# Patient Record
Sex: Female | Born: 1935 | Race: White | Hispanic: No | Smoking: Never smoker
Health system: Southern US, Community
[De-identification: ages and names within clinical notes are randomized; demographics above are authoritative.]

## PROBLEM LIST (undated history)

## (undated) DIAGNOSIS — J45909 Unspecified asthma, uncomplicated: Secondary | ICD-10-CM

## (undated) DIAGNOSIS — R011 Cardiac murmur, unspecified: Secondary | ICD-10-CM

## (undated) DIAGNOSIS — K469 Unspecified abdominal hernia without obstruction or gangrene: Secondary | ICD-10-CM

## (undated) DIAGNOSIS — I10 Essential (primary) hypertension: Secondary | ICD-10-CM

## (undated) DIAGNOSIS — E785 Hyperlipidemia, unspecified: Secondary | ICD-10-CM

## (undated) DIAGNOSIS — C439 Malignant melanoma of skin, unspecified: Secondary | ICD-10-CM

## (undated) DIAGNOSIS — R32 Unspecified urinary incontinence: Secondary | ICD-10-CM

## (undated) DIAGNOSIS — F419 Anxiety disorder, unspecified: Secondary | ICD-10-CM

## (undated) DIAGNOSIS — M1990 Unspecified osteoarthritis, unspecified site: Secondary | ICD-10-CM

## (undated) DIAGNOSIS — K219 Gastro-esophageal reflux disease without esophagitis: Secondary | ICD-10-CM

## (undated) DIAGNOSIS — R55 Syncope and collapse: Secondary | ICD-10-CM

## (undated) DIAGNOSIS — J449 Chronic obstructive pulmonary disease, unspecified: Secondary | ICD-10-CM

## (undated) HISTORY — PX: CHOLECYSTECTOMY: SHX55

## (undated) HISTORY — DX: Cardiac murmur, unspecified: R01.1

## (undated) HISTORY — DX: Chronic obstructive pulmonary disease, unspecified: J44.9

## (undated) HISTORY — DX: Unspecified osteoarthritis, unspecified site: M19.90

## (undated) HISTORY — PX: ABDOMINAL HYSTERECTOMY: SHX81

## (undated) HISTORY — PX: HERNIA REPAIR: SHX51

## (undated) HISTORY — DX: Malignant melanoma of skin, unspecified: C43.9

## (undated) HISTORY — DX: Syncope and collapse: R55

## (undated) HISTORY — DX: Essential (primary) hypertension: I10

## (undated) HISTORY — DX: Hyperlipidemia, unspecified: E78.5

## (undated) HISTORY — DX: Unspecified abdominal hernia without obstruction or gangrene: K46.9

## (undated) HISTORY — DX: Gastro-esophageal reflux disease without esophagitis: K21.9

## (undated) HISTORY — DX: Unspecified urinary incontinence: R32

---

## 2005-12-01 ENCOUNTER — Ambulatory Visit: Payer: Self-pay | Admitting: Ophthalmology

## 2005-12-05 ENCOUNTER — Ambulatory Visit: Payer: Self-pay | Admitting: Ophthalmology

## 2006-03-05 LAB — HM COLONOSCOPY

## 2007-11-14 ENCOUNTER — Ambulatory Visit: Payer: Self-pay | Admitting: Internal Medicine

## 2007-12-10 ENCOUNTER — Ambulatory Visit: Payer: Self-pay | Admitting: Internal Medicine

## 2009-12-11 ENCOUNTER — Ambulatory Visit: Payer: Self-pay | Admitting: Internal Medicine

## 2010-11-07 ENCOUNTER — Ambulatory Visit: Payer: Self-pay | Admitting: Family Medicine

## 2010-11-23 ENCOUNTER — Ambulatory Visit: Payer: Self-pay | Admitting: Pain Medicine

## 2010-12-09 ENCOUNTER — Ambulatory Visit: Payer: Self-pay | Admitting: Pain Medicine

## 2010-12-14 ENCOUNTER — Ambulatory Visit: Payer: Self-pay | Admitting: Internal Medicine

## 2010-12-22 ENCOUNTER — Ambulatory Visit: Payer: Self-pay | Admitting: Pain Medicine

## 2010-12-28 ENCOUNTER — Ambulatory Visit: Payer: Self-pay | Admitting: Pain Medicine

## 2010-12-30 ENCOUNTER — Encounter: Payer: Self-pay | Admitting: Internal Medicine

## 2011-01-11 ENCOUNTER — Ambulatory Visit: Payer: Self-pay | Admitting: Pain Medicine

## 2011-01-18 ENCOUNTER — Other Ambulatory Visit: Payer: Self-pay | Admitting: *Deleted

## 2011-01-18 MED ORDER — ESTROGENS CONJUGATED 0.3 MG PO TABS: 0.3000 mg | ORAL_TABLET | Freq: Every day | ORAL | Status: DC

## 2011-01-19 ENCOUNTER — Other Ambulatory Visit: Payer: Self-pay | Admitting: *Deleted

## 2011-01-19 MED ORDER — HYDROCODONE-ACETAMINOPHEN 5-325 MG PO TABS: 2.0000 | ORAL_TABLET | Freq: Four times a day (QID) | ORAL | Status: DC | PRN

## 2011-01-19 NOTE — Telephone Encounter (Signed)
Rx phoned to pharmacy.  

## 2011-01-25 ENCOUNTER — Ambulatory Visit: Payer: Self-pay | Admitting: Pain Medicine

## 2011-02-21 ENCOUNTER — Telehealth: Payer: Self-pay | Admitting: *Deleted

## 2011-02-21 ENCOUNTER — Encounter: Payer: Self-pay | Admitting: Internal Medicine

## 2011-02-21 ENCOUNTER — Ambulatory Visit (INDEPENDENT_AMBULATORY_CARE_PROVIDER_SITE_OTHER): Payer: Medicare Other | Admitting: Internal Medicine

## 2011-02-21 VITALS — BP 165/75 | HR 64 | Temp 98.0°F | Resp 16 | Ht 64.0 in | Wt 214.0 lb

## 2011-02-21 DIAGNOSIS — F329 Major depressive disorder, single episode, unspecified: Secondary | ICD-10-CM | POA: Insufficient documentation

## 2011-02-21 DIAGNOSIS — I10 Essential (primary) hypertension: Secondary | ICD-10-CM | POA: Insufficient documentation

## 2011-02-21 DIAGNOSIS — M5430 Sciatica, unspecified side: Secondary | ICD-10-CM

## 2011-02-21 DIAGNOSIS — F32A Depression, unspecified: Secondary | ICD-10-CM | POA: Insufficient documentation

## 2011-02-21 DIAGNOSIS — R300 Dysuria: Secondary | ICD-10-CM

## 2011-02-21 DIAGNOSIS — M5432 Sciatica, left side: Secondary | ICD-10-CM | POA: Insufficient documentation

## 2011-02-21 LAB — POCT URINALYSIS DIPSTICK
Bilirubin, UA: NEGATIVE
Blood, UA: NEGATIVE
Ketones, UA: NEGATIVE
Protein, UA: NEGATIVE
Spec Grav, UA: 1.015
pH, UA: 7

## 2011-02-21 MED ORDER — SERTRALINE HCL 50 MG PO TABS: 100.0000 mg | ORAL_TABLET | Freq: Every day | ORAL | Status: DC

## 2011-02-21 MED ORDER — LIDOCAINE 5 % EX PTCH: 1.0000 | MEDICATED_PATCH | Freq: Two times a day (BID) | CUTANEOUS | Status: DC

## 2011-02-21 MED ORDER — CLARITHROMYCIN 500 MG PO TABS: 500.0000 mg | ORAL_TABLET | Freq: Two times a day (BID) | ORAL | Status: AC

## 2011-02-21 NOTE — Telephone Encounter (Signed)
Patient takes 2 zoloft daily, but only 30 was called in. Is it okay to change quantity to 60. Please advise.

## 2011-02-21 NOTE — Progress Notes (Signed)
Subjective:    Patient ID: Cindy Graham, female    DOB: Oct 04, 1921, 77 y.o.   MRN: [id number]  HPI Cindy Graham is an 77 year old female with a history of sciatic pain and hypertension who presents for followup. Her sciatic pain has been recently severe. It extends from her left lower back into her left hip. It no longer radiates down her left leg. This pain has been keeping her from sleeping. She has been evaluated at the pain clinic and has had epidural injections without improvement. She has stopped taking Vicodin for pain because she did not like the side effects of this medication. She is having some difficulty walking and performing her activities of daily living because of severe left sciatic pain. She was recently told that she should be evaluated at a tertiary care center for an experimental procedure.  Cindy Graham also has a history of hypertension. She did not bring a record of her blood pressures today. She reports increased anxiety in the setting of chronic sciatic pain and inability to perform her activities of daily living, which she suspects raises her blood pressure. Her son notes that she is often diaphoretic after taking a shower or performing other activities of daily living. She denies any chest pain, palpitations, shortness of breath.  Cindy Graham also reports a several day history of increased urinary frequency and dysuria. She denies any flank pain or fever. She denies any blood in her urine. She denies any chills.  Outpatient Encounter Prescriptions as of 02/21/2011  Medication Sig Dispense Refill  . aspirin 81 MG tablet Take 81 mg by mouth daily.        . Biotin 1000 MCG tablet Take 1,000 mcg by mouth daily.        . cholecalciferol (VITAMIN D) 400 UNITS TABS Take 400 Units by mouth 2 (two) times daily.        . Flaxseed, Linseed, (FLAX SEED OIL) 1000 MG CAPS Take 1 capsule by mouth daily.        . Multiple Vitamin (MULTIVITAMIN) capsule Take 1 capsule by mouth daily.          . nitroGLYCERIN (NITROSTAT) 0.3 MG SL tablet Place 0.3 mg under the tongue every 5 (five) minutes as needed.        Marland Kitchen atenolol (TENORMIN) 25 MG tablet Take 1 tablet by mouth Daily.      . clarithromycin (BIAXIN) 500 MG tablet Take 1 tablet (500 mg total) by mouth 2 (two) times daily.  28 tablet  0  . enalapril (VASOTEC) 20 MG tablet Take 1 tablet by mouth Twice daily.      Marland Kitchen estrogens, conjugated, (PREMARIN) 0.3 MG tablet Take 1 tablet (0.3 mg total) by mouth daily. Take daily for 21 days then do not take for 7 days.  30 tablet  11  . FLOVENT HFA 110 MCG/ACT inhaler Inhale 2 puffs into the lungs daily.       Marland Kitchen gabapentin (NEURONTIN) 300 MG capsule Take 1 tablet by mouth daily.      Marland Kitchen lidocaine (LIDODERM) 5 % Place 1 patch onto the skin every 12 (twelve) hours. Remove & Discard patch within 12 hours or as directed by MD  10 patch  0  . nystatin (NYSTOP) 100000 UNIT/GM POWD Apply 1 application topically BID times 48H.      Marland Kitchen omeprazole (PRILOSEC) 20 MG capsule Take 1 tablet by mouth Daily.      Marland Kitchen oxybutynin (DITROPAN) 5 MG tablet Take 1 tablet by  mouth Daily.      . sertraline (ZOLOFT) 50 MG tablet Take 2 tablets (100 mg total) by mouth daily.  30 tablet  11  . triamterene-hydrochlorothiazide (MAXZIDE) 75-50 MG per tablet Take 1 tablet by mouth Daily.      . verapamil (CALAN-SR) 180 MG CR tablet Take 1 tablet by mouth Daily.        Review of Systems  Constitutional: Negative for fever, chills, appetite change, fatigue and unexpected weight change.  HENT: Negative for ear pain, congestion, sore throat, trouble swallowing, neck pain, voice change and sinus pressure.   Eyes: Negative for visual disturbance.  Respiratory: Negative for cough, shortness of breath, wheezing and stridor.   Cardiovascular: Negative for chest pain, palpitations and leg swelling.  Gastrointestinal: Negative for nausea, vomiting, abdominal pain, diarrhea, constipation, blood in stool, abdominal distention and anal  bleeding.  Genitourinary: Positive for dysuria and frequency. Negative for flank pain.  Musculoskeletal: Positive for myalgias, back pain and arthralgias. Negative for gait problem.  Skin: Negative for color change and rash.  Neurological: Negative for dizziness and headaches.  Hematological: Negative for adenopathy. Does not bruise/bleed easily.  Psychiatric/Behavioral: Negative for suicidal ideas, sleep disturbance and dysphoric mood. The patient is nervous/anxious.    BP 165/75  Pulse 64  Temp(Src) 98 F (36.7 C) (Oral)  Resp 16  Ht 5\' 4"  (1.626 m)  Wt 214 lb (97.07 kg)  BMI 36.73 kg/m2  SpO2 94%     Objective:   Physical Exam  Constitutional: She is oriented to person, place, and time. She appears well-developed and well-nourished. No distress.  HENT:  Head: Normocephalic and atraumatic.  Right Ear: External ear normal.  Left Ear: External ear normal.  Nose: Nose normal.  Mouth/Throat: Oropharynx is clear and moist. No oropharyngeal exudate.  Eyes: Conjunctivae are normal. Pupils are equal, round, and reactive to light. Right eye exhibits no discharge. Left eye exhibits no discharge. No scleral icterus.  Neck: Normal range of motion. Neck supple. No tracheal deviation present. No thyromegaly present.  Cardiovascular: Normal rate, regular rhythm, normal heart sounds and intact distal pulses.  Exam reveals no gallop and no friction rub.   No murmur heard. Pulmonary/Chest: Effort normal and breath sounds normal. No respiratory distress. She has no wheezes. She has no rales. She exhibits no tenderness.  Abdominal: There is no CVA tenderness.  Musculoskeletal: Normal range of motion. She exhibits tenderness (lower back). She exhibits no edema.  Lymphadenopathy:    She has no cervical adenopathy.  Neurological: She is alert and oriented to person, place, and time. No cranial nerve deficit. She exhibits normal muscle tone. Gait (limited by pain) abnormal. Coordination normal.   Skin: Skin is warm and dry. No rash noted. She is not diaphoretic. No erythema. No pallor.  Psychiatric: She has a normal mood and affect. Her behavior is normal. Judgment and thought content normal.          Assessment & Plan:  1. Sciatica - patient with severe left sciatic pain which has not responded to steroid injection. Will set up an evaluation with Dr. Yves Dill at [facility]. Patient would prefer not to use her contacts for severe pain because of sedation. We will try and anal Lidoderm patch to see if there is any improvement with this. She will followup in one month.  2. Dysuria - patient with dysuria. Urine is remarkable for leukocytes. Will treat empirically with clarithromycin. Note, we are treating with clarithromycin because of several antibiotic allergies. She is  also uncertain about a Cipro allergy. We will send urine for culture. She will followup in one month.  3. Hypertension - patient with hypertension. Blood pressure is elevated today. Likely secondary to increased pain and anxiety. We'll continue to monitor her and have her return to clinic in one month.   4. Anxiety - patient with significant anxiety related to recent pain and difficulty doing ADLs. Will increase sertraline dose to 100 mg daily. She will follow up in one month.

## 2011-02-21 NOTE — Telephone Encounter (Signed)
Done

## 2011-02-21 NOTE — Telephone Encounter (Signed)
Fine to call in quantity 60.

## 2011-02-21 NOTE — Patient Instructions (Signed)
Start antibiotic today. We will set up appointment with Dr. Yves Dill. Follow up 1 month.

## 2011-02-23 LAB — URINE CULTURE

## 2011-02-24 ENCOUNTER — Other Ambulatory Visit: Payer: Self-pay | Admitting: *Deleted

## 2011-02-24 MED ORDER — CIPROFLOXACIN HCL 250 MG PO TABS: 250.0000 mg | ORAL_TABLET | Freq: Two times a day (BID) | ORAL | Status: AC

## 2011-03-02 ENCOUNTER — Other Ambulatory Visit: Payer: Self-pay | Admitting: Internal Medicine

## 2011-03-02 DIAGNOSIS — M5432 Sciatica, left side: Secondary | ICD-10-CM

## 2011-03-02 MED ORDER — LIDOCAINE 5 % EX PTCH: 1.0000 | MEDICATED_PATCH | Freq: Two times a day (BID) | CUTANEOUS | Status: DC

## 2011-03-21 ENCOUNTER — Other Ambulatory Visit: Payer: Self-pay | Admitting: Internal Medicine

## 2011-03-21 MED ORDER — ATENOLOL 25 MG PO TABS: 25.0000 mg | ORAL_TABLET | Freq: Every day | ORAL | Status: DC

## 2011-03-28 ENCOUNTER — Ambulatory Visit (INDEPENDENT_AMBULATORY_CARE_PROVIDER_SITE_OTHER): Payer: Medicare Other | Admitting: Internal Medicine

## 2011-03-28 ENCOUNTER — Encounter: Payer: Self-pay | Admitting: Internal Medicine

## 2011-03-28 VITALS — BP 124/60 | HR 73 | Temp 98.3°F | Wt 211.0 lb

## 2011-03-28 DIAGNOSIS — R5383 Other fatigue: Secondary | ICD-10-CM

## 2011-03-28 DIAGNOSIS — E039 Hypothyroidism, unspecified: Secondary | ICD-10-CM

## 2011-03-28 DIAGNOSIS — E531 Pyridoxine deficiency: Secondary | ICD-10-CM

## 2011-03-28 DIAGNOSIS — R5381 Other malaise: Secondary | ICD-10-CM

## 2011-03-28 DIAGNOSIS — D510 Vitamin B12 deficiency anemia due to intrinsic factor deficiency: Secondary | ICD-10-CM

## 2011-03-28 LAB — TSH: TSH: 1.3 u[IU]/mL (ref 0.35–5.50)

## 2011-03-28 LAB — CBC WITH DIFFERENTIAL/PLATELET
Basophils Relative: 0.3 % (ref 0.0–3.0)
Eosinophils Relative: 3.7 % (ref 0.0–5.0)
HCT: 40.2 % (ref 36.0–46.0)
Lymphs Abs: 2.5 10*3/uL (ref 0.7–4.0)
Monocytes Relative: 7.7 % (ref 3.0–12.0)
Platelets: 275 10*3/uL (ref 150.0–400.0)
RBC: 3.96 Mil/uL (ref 3.87–5.11)
WBC: 8.1 10*3/uL (ref 4.5–10.5)

## 2011-03-28 LAB — COMPREHENSIVE METABOLIC PANEL
ALT: 18 U/L (ref 0–35)
CO2: 25 mEq/L (ref 19–32)
Calcium: 9 mg/dL (ref 8.4–10.5)
Chloride: 106 mEq/L (ref 96–112)
Creatinine, Ser: 1.2 mg/dL (ref 0.4–1.2)
GFR: 43.43 mL/min — ABNORMAL LOW (ref >60.00)
Glucose, Bld: 109 mg/dL — ABNORMAL HIGH (ref 70–99)
Sodium: 140 mEq/L (ref 135–145)
Total Protein: 6.9 g/dL (ref 6.0–8.3)

## 2011-03-28 MED ORDER — CYANOCOBALAMIN 1000 MCG/ML IJ SOLN
1000.0000 ug | Freq: Once | INTRAMUSCULAR | Status: AC
  Administered 2011-03-28: 1000 ug via INTRAMUSCULAR

## 2011-03-28 NOTE — Progress Notes (Signed)
Subjective:    Patient ID: Cindy Graham, female    DOB: [birth year], 77 y.o.   MRN: [id number]  HPI 77 YO female presents to follow up sciatic pain. Reports significant improvement with use of Lidoderm patch. Continues to have some arthritic pain in knees bilaterally.   Primary concern today is fatigue. Reports generalized weakness and difficulty completing ADLs. Denies chest pain, dyspnea.  Notes weakness in BLE. No recent falls. Notably, has missed last 3 B12 injections.  Outpatient Encounter Prescriptions as of 03/28/2011  Medication Sig Dispense Refill  . aspirin 81 MG tablet Take 81 mg by mouth daily.        Marland Kitchen atenolol (TENORMIN) 25 MG tablet Take 1 tablet (25 mg total) by mouth daily.  30 tablet  6  . Biotin 1000 MCG tablet Take 1,000 mcg by mouth daily.        . cholecalciferol (VITAMIN D) 400 UNITS TABS Take 400 Units by mouth 2 (two) times daily.        . enalapril (VASOTEC) 20 MG tablet Take 1 tablet by mouth Twice daily.      Marland Kitchen estrogens, conjugated, (PREMARIN) 0.3 MG tablet Take 0.3 mg by mouth daily.        . Flaxseed, Linseed, (FLAX SEED OIL) 1000 MG CAPS Take 1 capsule by mouth daily.        Marland Kitchen FLOVENT HFA 110 MCG/ACT inhaler Inhale 2 puffs into the lungs daily.       Marland Kitchen gabapentin (NEURONTIN) 300 MG capsule Take 1 tablet by mouth daily.      Marland Kitchen lidocaine (LIDODERM) 5 % Place 1 patch onto the skin every 12 (twelve) hours. Remove & Discard patch within 12 hours or as directed by MD  30 patch  3  . Multiple Vitamin (MULTIVITAMIN) capsule Take 1 capsule by mouth daily.        . nitroGLYCERIN (NITROSTAT) 0.3 MG SL tablet Place 0.3 mg under the tongue every 5 (five) minutes as needed.        . nystatin (NYSTOP) 100000 UNIT/GM POWD Apply 1 application topically BID times 48H.      Marland Kitchen omeprazole (PRILOSEC) 20 MG capsule Take 1 tablet by mouth Daily.      Marland Kitchen oxybutynin (DITROPAN) 5 MG tablet Take 1 tablet by mouth Daily.         Review of Systems  Constitutional: Positive for fatigue.  Negative for fever, chills, appetite change and unexpected weight change.  HENT: Negative for ear pain, congestion, sore throat, trouble swallowing, neck pain, voice change and sinus pressure.   Eyes: Negative for visual disturbance.  Respiratory: Negative for cough, shortness of breath, wheezing and stridor.   Cardiovascular: Negative for chest pain, palpitations and leg swelling.  Gastrointestinal: Negative for nausea, vomiting, abdominal pain, diarrhea, constipation, blood in stool, abdominal distention and anal bleeding.  Genitourinary: Negative for dysuria and flank pain.  Musculoskeletal: Positive for myalgias, back pain and arthralgias. Negative for gait problem.  Skin: Negative for color change and rash.  Neurological: Positive for weakness. Negative for dizziness and headaches.  Hematological: Negative for adenopathy. Does not bruise/bleed easily.  Psychiatric/Behavioral: Negative for suicidal ideas, sleep disturbance and dysphoric mood. The patient is not nervous/anxious.    BP 124/60  Pulse 73  Temp(Src) 98.3 F (36.8 C) (Oral)  Wt 211 lb (95.709 kg)  SpO2 89%     Objective:   Physical Exam  Constitutional: Cindy Graham is oriented to person, place, and time. Cindy Graham appears well-developed and well-nourished. No  distress.  HENT:  Head: Normocephalic and atraumatic.  Right Ear: External ear normal.  Left Ear: External ear normal.  Nose: Nose normal.  Mouth/Throat: Oropharynx is clear and moist. No oropharyngeal exudate.  Eyes: Conjunctivae are normal. Pupils are equal, round, and reactive to light. Right eye exhibits no discharge. Left eye exhibits no discharge. No scleral icterus.  Neck: Normal range of motion. Neck supple. No tracheal deviation present. No thyromegaly present.  Cardiovascular: Normal rate, regular rhythm, normal heart sounds and intact distal pulses.  Exam reveals no gallop and no friction rub.   No murmur heard. Pulmonary/Chest: Effort normal and breath sounds  normal. No respiratory distress. Cindy Graham has no wheezes. Cindy Graham has no rales. Cindy Graham exhibits no tenderness.  Musculoskeletal: Normal range of motion. Cindy Graham exhibits no edema and no tenderness.  Lymphadenopathy:    Cindy Graham has no cervical adenopathy.  Neurological: Cindy Graham is alert and oriented to person, place, and time. No cranial nerve deficit. Cindy Graham exhibits normal muscle tone. Coordination normal.  Skin: Skin is warm and dry. No rash noted. Cindy Graham is not diaphoretic. No erythema. No pallor.  Psychiatric: Cindy Graham has a normal mood and affect. Her behavior is normal. Judgment and thought content normal.          Assessment & Plan:  1. Fatigue - Likely secondary to both deconditioning and Vit B12 deficiency. Encouraged increased physical activity. Discussed adding PT for strengthening if no improvement next few weeks. Will restart B12 today. Will check B12 and TSH with labs. Follow up 1 month.  2. Sciatic pain - Improved with lidoderm patch. Will continue to monitor.  3. Pernicious anemia - Has missed several B12 shots. Will check B12 level and CBC today. Resume B12 supplementation.

## 2011-04-04 ENCOUNTER — Other Ambulatory Visit (INDEPENDENT_AMBULATORY_CARE_PROVIDER_SITE_OTHER): Payer: Medicare Other | Admitting: *Deleted

## 2011-04-04 DIAGNOSIS — E538 Deficiency of other specified B group vitamins: Secondary | ICD-10-CM

## 2011-04-04 MED ORDER — CYANOCOBALAMIN 1000 MCG/ML IJ SOLN
1000.0000 ug | Freq: Once | INTRAMUSCULAR | Status: AC
  Administered 2011-04-04: 1000 ug via INTRAMUSCULAR

## 2011-04-11 ENCOUNTER — Other Ambulatory Visit: Payer: Medicare Other | Admitting: *Deleted

## 2011-04-11 DIAGNOSIS — E538 Deficiency of other specified B group vitamins: Secondary | ICD-10-CM

## 2011-04-11 MED ORDER — CYANOCOBALAMIN 1000 MCG/ML IJ SOLN
1000.0000 ug | Freq: Once | INTRAMUSCULAR | Status: AC
  Administered 2011-04-11: 1000 ug via INTRAMUSCULAR

## 2011-04-15 ENCOUNTER — Other Ambulatory Visit (INDEPENDENT_AMBULATORY_CARE_PROVIDER_SITE_OTHER): Payer: Medicare Other | Admitting: *Deleted

## 2011-04-15 ENCOUNTER — Telehealth: Payer: Self-pay | Admitting: *Deleted

## 2011-04-15 DIAGNOSIS — N390 Urinary tract infection, site not specified: Secondary | ICD-10-CM

## 2011-04-15 LAB — POCT URINALYSIS DIPSTICK
Bilirubin, UA: NEGATIVE
Glucose, UA: NEGATIVE
Ketones, UA: NEGATIVE
Nitrite, UA: POSITIVE

## 2011-04-15 MED ORDER — CIPROFLOXACIN HCL 250 MG PO TABS: 250.0000 mg | ORAL_TABLET | Freq: Two times a day (BID) | ORAL | Status: AC

## 2011-04-15 NOTE — Telephone Encounter (Signed)
Pt c/o possible UTI, urinary frequency & pain. She is coming in today for u/a & culture if positive. Dr Darrick Huntsman, we will send result to you for review when ready in Dr Tilman Neat absence, [facility] YOU!

## 2011-04-15 NOTE — Telephone Encounter (Signed)
Message copied by Vernie Murders on Fri Apr 15, 2011  4:44 PM ------      Message from: Duncan Dull      Created: Fri Apr 15, 2011  4:05 PM       UTI likely.  Rx  Ciprofloxacin 250 mg twice daily for 5 days  #10 no refills.  Send urine for culture.

## 2011-04-15 NOTE — Telephone Encounter (Signed)
Done,  Patient informed

## 2011-04-18 ENCOUNTER — Ambulatory Visit (INDEPENDENT_AMBULATORY_CARE_PROVIDER_SITE_OTHER): Payer: Medicare Other | Admitting: *Deleted

## 2011-04-18 DIAGNOSIS — E538 Deficiency of other specified B group vitamins: Secondary | ICD-10-CM

## 2011-04-18 LAB — URINE CULTURE: Colony Count: >=100000

## 2011-04-18 MED ORDER — CYANOCOBALAMIN 1000 MCG/ML IJ SOLN
1000.0000 ug | Freq: Once | INTRAMUSCULAR | Status: AC
  Administered 2011-04-18: 1000 ug via INTRAMUSCULAR

## 2011-04-19 ENCOUNTER — Other Ambulatory Visit: Payer: Self-pay | Admitting: *Deleted

## 2011-04-19 MED ORDER — TRIAMTERENE-HCTZ 75-50 MG PO TABS: 1.0000 | ORAL_TABLET | Freq: Every day | ORAL | Status: DC

## 2011-04-27 ENCOUNTER — Ambulatory Visit (INDEPENDENT_AMBULATORY_CARE_PROVIDER_SITE_OTHER): Payer: Medicare Other | Admitting: Internal Medicine

## 2011-04-27 ENCOUNTER — Other Ambulatory Visit: Payer: Self-pay | Admitting: Internal Medicine

## 2011-04-27 ENCOUNTER — Encounter: Payer: Self-pay | Admitting: Internal Medicine

## 2011-04-27 VITALS — BP 120/72 | HR 68 | Temp 98.3°F | Wt 211.0 lb

## 2011-04-27 DIAGNOSIS — M25569 Pain in unspecified knee: Secondary | ICD-10-CM

## 2011-04-27 DIAGNOSIS — N390 Urinary tract infection, site not specified: Secondary | ICD-10-CM

## 2011-04-27 DIAGNOSIS — E538 Deficiency of other specified B group vitamins: Secondary | ICD-10-CM

## 2011-04-27 MED ORDER — CYANOCOBALAMIN 1000 MCG/ML IJ SOLN
1000.0000 ug | Freq: Once | INTRAMUSCULAR | Status: AC
  Administered 2011-04-27: 1000 ug via INTRAMUSCULAR

## 2011-04-27 MED ORDER — CIPROFLOXACIN HCL 250 MG PO TABS: 250.0000 mg | ORAL_TABLET | Freq: Two times a day (BID) | ORAL | Status: AC

## 2011-04-27 MED ORDER — HYDROCODONE-ACETAMINOPHEN 5-325 MG PO TABS: 2.0000 | ORAL_TABLET | Freq: Three times a day (TID) | ORAL | Status: AC | PRN

## 2011-04-27 NOTE — Progress Notes (Signed)
Subjective:    Patient ID: Cindy Graham, female    DOB: 01-04-1922, 77 y.o.   MRN: [id number]  HPI 77 year old female with a history of hypertension, severe sciatic pain, and osteoarthritis presents for followup. She reports that her sciatic pain is slightly improved. She reports that her bilateral knee pain is much worse. She was seen by orthopedics and they recommended Synvisc injection. She is unable to afford this so has been using hydrocodone as needed for severe pain. Her severe osteoarthritis in her knees limits her ability to walk. She is currently using a cane.  She notes some burning when she urinates. This has been present for several weeks. She was recently treated with a five-day course of Cipro for urinary tract infection. She reports her symptoms improved but did not completely resolve. She denies any fever or chills. She denies any flank pain.  Outpatient Encounter Prescriptions as of 04/27/2011  Medication Sig Dispense Refill  . aspirin 81 MG tablet Take 81 mg by mouth daily.        Marland Kitchen atenolol (TENORMIN) 25 MG tablet Take 1 tablet (25 mg total) by mouth daily.  30 tablet  6  . Biotin 1000 MCG tablet Take 1,000 mcg by mouth daily.        . cholecalciferol (VITAMIN D) 400 UNITS TABS Take 400 Units by mouth 2 (two) times daily.        . enalapril (VASOTEC) 20 MG tablet Take 1 tablet by mouth Twice daily.      Marland Kitchen estrogens, conjugated, (PREMARIN) 0.3 MG tablet Take 0.3 mg by mouth daily.        . Flaxseed, Linseed, (FLAX SEED OIL) 1000 MG CAPS Take 1 capsule by mouth daily.        Marland Kitchen FLOVENT HFA 110 MCG/ACT inhaler Inhale 2 puffs into the lungs daily.       Marland Kitchen gabapentin (NEURONTIN) 300 MG capsule Take 1 tablet by mouth daily.      Marland Kitchen lidocaine (LIDODERM) 5 % Place 1 patch onto the skin every 12 (twelve) hours. Remove & Discard patch within 12 hours or as directed by MD  30 patch  3  . Multiple Vitamin (MULTIVITAMIN) capsule Take 1 capsule by mouth daily.        . nitroGLYCERIN  (NITROSTAT) 0.3 MG SL tablet Place 0.3 mg under the tongue every 5 (five) minutes as needed.        . nystatin (NYSTOP) 100000 UNIT/GM POWD Apply 1 application topically BID times 48H.      Marland Kitchen omeprazole (PRILOSEC) 20 MG capsule Take 1 tablet by mouth Daily.      Marland Kitchen oxybutynin (DITROPAN) 5 MG tablet Take 1 tablet by mouth Daily.      . sertraline (ZOLOFT) 50 MG tablet Take 2 tablets (100 mg total) by mouth daily.  30 tablet  11  . triamterene-hydrochlorothiazide (MAXZIDE) 75-50 MG per tablet Take 1 tablet by mouth daily.  90 tablet  1  . verapamil (CALAN-SR) 180 MG CR tablet Take 1 tablet by mouth Daily.      . ciprofloxacin (CIPRO) 250 MG tablet Take 1 tablet (250 mg total) by mouth 2 (two) times daily.  28 tablet  0  . HYDROcodone-acetaminophen (NORCO) 5-325 MG per tablet Take 2 tablets by mouth every 8 (eight) hours as needed for pain.  90 tablet  0     Review of Systems  Constitutional: Negative for fever, chills, appetite change, fatigue and unexpected weight change.  HENT: Negative for  ear pain, congestion, sore throat, trouble swallowing, neck pain, voice change and sinus pressure.   Eyes: Negative for visual disturbance.  Respiratory: Negative for cough, shortness of breath, wheezing and stridor.   Cardiovascular: Negative for chest pain, palpitations and leg swelling.  Gastrointestinal: Negative for nausea, vomiting, abdominal pain, diarrhea, constipation, blood in stool, abdominal distention and anal bleeding.  Genitourinary: Negative for dysuria and flank pain.  Musculoskeletal: Positive for myalgias, back pain, joint swelling, arthralgias and gait problem.  Skin: Negative for color change and rash.  Neurological: Negative for dizziness and headaches.  Hematological: Negative for adenopathy. Does not bruise/bleed easily.  Psychiatric/Behavioral: Negative for suicidal ideas, sleep disturbance and dysphoric mood. The patient is not nervous/anxious.    BP 120/72  Pulse 68  Temp(Src)  98.3 F (36.8 C) (Oral)  Wt 211 lb (95.709 kg)  SpO2 96%     Objective:   Physical Exam  Constitutional: She is oriented to person, place, and time. She appears well-developed and well-nourished. No distress.  HENT:  Head: Normocephalic and atraumatic.  Right Ear: External ear normal.  Left Ear: External ear normal.  Nose: Nose normal.  Mouth/Throat: Oropharynx is clear and moist. No oropharyngeal exudate.  Eyes: Conjunctivae are normal. Pupils are equal, round, and reactive to light. Right eye exhibits no discharge. Left eye exhibits no discharge. No scleral icterus.  Neck: Normal range of motion. Neck supple. No tracheal deviation present. No thyromegaly present.  Cardiovascular: Normal rate, regular rhythm, normal heart sounds and intact distal pulses.  Exam reveals no gallop and no friction rub.   No murmur heard. Pulmonary/Chest: Effort normal and breath sounds normal. No respiratory distress. She has no wheezes. She has no rales. She exhibits no tenderness.  Musculoskeletal: She exhibits no edema and no tenderness.       Right knee: She exhibits decreased range of motion and swelling.       Left knee: She exhibits decreased range of motion and swelling.       Lumbar back: She exhibits decreased range of motion and pain.  Lymphadenopathy:    She has no cervical adenopathy.  Neurological: She is alert and oriented to person, place, and time. No cranial nerve deficit. She exhibits normal muscle tone. Coordination normal.  Skin: Skin is warm and dry. No rash noted. She is not diaphoretic. No erythema. No pallor.  Psychiatric: She has a normal mood and affect. Her behavior is normal. Judgment and thought content normal.          Assessment & Plan:  1. Hypertension - BP well controlled on current medications. Will not make any changes today. Follow up in 3 months.  2. Back pain - Secondary to DJD. Will continue hydrocodone prn.  3. Knee pain - Secondary to osteoarthritis. Pt  is followed by orthopedics. Recommended Synvisc injection, however pt unable to afford. Will continue hydrocodone for severe pain. Follow up 3 months.  4. Dysuria - Will send urine for culture and UA today. Will treat with cipro 250mg  po bid x 2 weeks.

## 2011-04-29 LAB — URINE CULTURE: Organism ID, Bacteria: NO GROWTH

## 2011-05-02 ENCOUNTER — Telehealth: Payer: Self-pay | Admitting: *Deleted

## 2011-05-02 NOTE — Telephone Encounter (Signed)
Spoke w/pt - she wanted results of urine culture - informed patient, no growth

## 2011-05-02 NOTE — Telephone Encounter (Signed)
Pt left VM requesting a call back.  

## 2011-05-10 ENCOUNTER — Telehealth: Payer: Self-pay | Admitting: Internal Medicine

## 2011-05-10 ENCOUNTER — Ambulatory Visit (INDEPENDENT_AMBULATORY_CARE_PROVIDER_SITE_OTHER): Payer: Medicare Other | Admitting: *Deleted

## 2011-05-10 DIAGNOSIS — R6889 Other general symptoms and signs: Secondary | ICD-10-CM

## 2011-05-10 LAB — POCT INFLUENZA A/B: Influenza B, POC: NEGATIVE

## 2011-05-10 NOTE — Telephone Encounter (Signed)
Spoke w/pt - she woke up today with fever, chills, nonproductive cough, h/a and weakness. No sinus congestion, ST, or ear complaints. She has taken her regular prescription meds. MD informed, ok to take tylenol and pt is coming in this afternoon for nurse visit for rapid flu swab.   If flu is positive, tamiflu bid x 5 days?

## 2011-05-10 NOTE — Telephone Encounter (Signed)
Rapid flu was negative. Advised patient to continue tylenol, rest and call office w/any change in symptoms.

## 2011-05-10 NOTE — Telephone Encounter (Signed)
Yes, agree

## 2011-05-12 ENCOUNTER — Telehealth: Payer: Self-pay | Admitting: *Deleted

## 2011-05-12 NOTE — Telephone Encounter (Signed)
We already are overbooked at 4:30.  Can we add her tomorrow morning? Is she holding down liquids?

## 2011-05-12 NOTE — Telephone Encounter (Signed)
No nausea or vomiting, she will call office w/any change in symptoms. Scheduled for OV tomorrow am.

## 2011-05-12 NOTE — Telephone Encounter (Signed)
Spoke w/pt - Rapid flu was negative and she was advised to call back if no improvement. She continues to c/o chills, fever, sweats, weakness, non-productive cough, and clear sinus drainage. Also had some loose stools that have resolved from using imodium. Would you like to see patient for OV? OK for 4:30 or should she continue to use tylenol, rest and f/u tomorrow if not better?

## 2011-05-13 ENCOUNTER — Telehealth: Payer: Self-pay | Admitting: *Deleted

## 2011-05-13 ENCOUNTER — Ambulatory Visit (INDEPENDENT_AMBULATORY_CARE_PROVIDER_SITE_OTHER): Payer: Medicare Other | Admitting: Internal Medicine

## 2011-05-13 VITALS — BP 168/60 | HR 73 | Temp 98.2°F | Wt 205.0 lb

## 2011-05-13 DIAGNOSIS — R197 Diarrhea, unspecified: Secondary | ICD-10-CM

## 2011-05-13 NOTE — Telephone Encounter (Signed)
Pt left VM - FYI she says diarrhea is better and will be throwing out the test kit she was given.

## 2011-05-14 ENCOUNTER — Encounter: Payer: Self-pay | Admitting: Internal Medicine

## 2011-05-14 NOTE — Progress Notes (Signed)
Subjective:    Patient ID: Cindy Graham, female    DOB: 05-Jul-1921, 77 y.o.   MRN: [id number]  HPI 77YO female with 1 week h/o diarrhea, fever, chills, nasal congestion, cough.  Denies abdominal pain or blood in stool.  Reports diarrhea is 2-3 episodes per day and watery. Has taken immodium with improvement in diarrhea.  Nasal congestion is clear and cough is non-productive.  Taking tylenol prn for fever.  Notes she has been taking an antibiotic (cannot recall name) prescribed by her dermatologist for "skin infection" over last 2 weeks.  Outpatient Encounter Prescriptions as of 05/13/2011  Medication Sig Dispense Refill  . aspirin 81 MG tablet Take 81 mg by mouth daily.        Marland Kitchen atenolol (TENORMIN) 25 MG tablet Take 1 tablet (25 mg total) by mouth daily.  30 tablet  6  . Biotin 1000 MCG tablet Take 1,000 mcg by mouth daily.        . cholecalciferol (VITAMIN D) 400 UNITS TABS Take 400 Units by mouth 2 (two) times daily.        . enalapril (VASOTEC) 20 MG tablet Take 1 tablet by mouth Twice daily.      Marland Kitchen estrogens, conjugated, (PREMARIN) 0.3 MG tablet Take 0.3 mg by mouth daily.        . Flaxseed, Linseed, (FLAX SEED OIL) 1000 MG CAPS Take 1 capsule by mouth daily.        Marland Kitchen FLOVENT HFA 110 MCG/ACT inhaler Inhale 2 puffs into the lungs daily.       Marland Kitchen gabapentin (NEURONTIN) 300 MG capsule Take 1 tablet by mouth daily.      Marland Kitchen lidocaine (LIDODERM) 5 % Place 1 patch onto the skin every 12 (twelve) hours. Remove & Discard patch within 12 hours or as directed by MD  30 patch  3  . Multiple Vitamin (MULTIVITAMIN) capsule Take 1 capsule by mouth daily.        . nitroGLYCERIN (NITROSTAT) 0.3 MG SL tablet Place 0.3 mg under the tongue every 5 (five) minutes as needed.        . nystatin (NYSTOP) 100000 UNIT/GM POWD Apply 1 application topically BID times 48H.      Marland Kitchen omeprazole (PRILOSEC) 20 MG capsule Take 1 tablet by mouth Daily.      Marland Kitchen oxybutynin (DITROPAN) 5 MG tablet Take 1 tablet by mouth Daily.        . sertraline (ZOLOFT) 50 MG tablet Take 2 tablets (100 mg total) by mouth daily.  30 tablet  11  . triamterene-hydrochlorothiazide (MAXZIDE) 75-50 MG per tablet Take 1 tablet by mouth daily.  90 tablet  1  . verapamil (CALAN-SR) 180 MG CR tablet Take 1 tablet by mouth Daily.        Review of Systems  Constitutional: Positive for fever, chills and fatigue.  HENT: Positive for congestion, rhinorrhea and postnasal drip. Negative for ear pain, sore throat and sinus pressure.   Respiratory: Positive for cough. Negative for shortness of breath.   Cardiovascular: Negative for chest pain, palpitations and leg swelling.  Gastrointestinal: Positive for diarrhea. Negative for abdominal pain, constipation, blood in stool and abdominal distention.  Musculoskeletal: Negative for myalgias and arthralgias.   BP 168/60  Pulse 73  Temp(Src) 98.2 F (36.8 C) (Oral)  Wt 205 lb (92.987 kg)  SpO2 92%     Objective:   Physical Exam  Constitutional: She is oriented to person, place, and time. She appears well-developed and well-nourished. No distress.  HENT:  Head: Normocephalic and atraumatic.  Right Ear: External ear normal.  Left Ear: External ear normal.  Nose: Nose normal.  Mouth/Throat: Oropharynx is clear and moist. No oropharyngeal exudate.  Eyes: Conjunctivae are normal. Pupils are equal, round, and reactive to light. Right eye exhibits no discharge. Left eye exhibits no discharge. No scleral icterus.  Neck: Normal range of motion. Neck supple. No tracheal deviation present. No thyromegaly present.  Cardiovascular: Normal rate, regular rhythm, normal heart sounds and intact distal pulses.  Exam reveals no gallop and no friction rub.   No murmur heard. Pulmonary/Chest: Effort normal and breath sounds normal. No respiratory distress. She has no wheezes. She has no rales. She exhibits no tenderness.  Abdominal: Soft. Bowel sounds are normal. She exhibits no distension and no mass. There is no  tenderness. There is no rebound and no guarding.  Musculoskeletal: Normal range of motion. She exhibits no edema and no tenderness.  Lymphadenopathy:    She has no cervical adenopathy.  Neurological: She is alert and oriented to person, place, and time. No cranial nerve deficit. She exhibits normal muscle tone. Coordination normal.  Skin: Skin is warm and dry. No rash noted. She is not diaphoretic. No erythema. No pallor.  Psychiatric: She has a normal mood and affect. Her behavior is normal. Judgment and thought content normal.          Assessment & Plan:  1. Diarrhea - Likely viral given symptoms of fever, chills, nasal congestion.  Rapid flu was negative. Pt has been taking po antibiotics (she is unsure of name) from dermatologist, so C.DIff would be another consideration, however less likely.  Encouraged continued supportive care, increased fluid intake, tylenol and immodium prn. If symptoms persist through weekend, she will submit stool sample for testing for CDiff and culture.  If she is unable to keep down fluids or develops worsening symptoms over weekend, she will go to ED for eval and IVF. Follow up next week.

## 2011-05-20 ENCOUNTER — Other Ambulatory Visit: Payer: Self-pay | Admitting: *Deleted

## 2011-05-20 ENCOUNTER — Encounter: Payer: Self-pay | Admitting: Internal Medicine

## 2011-05-20 ENCOUNTER — Ambulatory Visit (INDEPENDENT_AMBULATORY_CARE_PROVIDER_SITE_OTHER): Payer: Medicare Other | Admitting: Internal Medicine

## 2011-05-20 VITALS — BP 122/72 | HR 73 | Temp 98.7°F | Wt 204.0 lb

## 2011-05-20 DIAGNOSIS — J40 Bronchitis, not specified as acute or chronic: Secondary | ICD-10-CM

## 2011-05-20 MED ORDER — ALBUTEROL SULFATE HFA 108 (90 BASE) MCG/ACT IN AERS: 2.0000 | INHALATION_SPRAY | Freq: Four times a day (QID) | RESPIRATORY_TRACT | Status: DC | PRN

## 2011-05-20 MED ORDER — VERAPAMIL HCL ER 180 MG PO TBCR: 180.0000 mg | EXTENDED_RELEASE_TABLET | Freq: Two times a day (BID) | ORAL | Status: DC

## 2011-05-20 MED ORDER — OMEPRAZOLE 20 MG PO CPDR: 20.0000 mg | DELAYED_RELEASE_CAPSULE | Freq: Every day | ORAL | Status: DC

## 2011-05-20 MED ORDER — AZITHROMYCIN 250 MG PO TABS: ORAL_TABLET | ORAL | Status: AC

## 2011-05-20 NOTE — Progress Notes (Signed)
Subjective:    Patient ID: Cindy Graham, female    DOB: [birth year], 77 y.o.   MRN: [id number]  HPI 77 year old female presents for followup after a two-week history of nasal congestion, hoarseness, cough, shortness of breath, and chills. Her symptoms first began 2 weeks ago with general malaise fever, chills, nasal congestion, and diarrhea. She reports that over the last week her diarrhea has resolved. She notes some persistence of her cough which is productive of purulent sputum. She also notes wheezing and mild shortness of breath. She has been using over-the-counter cough and cold preparations with some improvement. She feels that, in general, her symptoms are improving. She denies any chest pain. She denies any fever or chills over the last 48 hours.  Outpatient Encounter Prescriptions as of 05/20/2011  Medication Sig Dispense Refill  . aspirin 81 MG tablet Take 81 mg by mouth daily.        Marland Kitchen atenolol (TENORMIN) 25 MG tablet Take 1 tablet (25 mg total) by mouth daily.  30 tablet  6  . Biotin 1000 MCG tablet Take 1,000 mcg by mouth daily.        . cholecalciferol (VITAMIN D) 400 UNITS TABS Take 400 Units by mouth 2 (two) times daily.        . enalapril (VASOTEC) 20 MG tablet Take 1 tablet by mouth Twice daily.      Marland Kitchen estrogens, conjugated, (PREMARIN) 0.3 MG tablet Take 0.3 mg by mouth daily.        . Flaxseed, Linseed, (FLAX SEED OIL) 1000 MG CAPS Take 1 capsule by mouth daily.        Marland Kitchen FLOVENT HFA 110 MCG/ACT inhaler Inhale 2 puffs into the lungs daily.       Marland Kitchen gabapentin (NEURONTIN) 300 MG capsule Take 1 tablet by mouth daily.      Marland Kitchen lidocaine (LIDODERM) 5 % Place 1 patch onto the skin every 12 (twelve) hours. Remove & Discard patch within 12 hours or as directed by MD  30 patch  3  . Multiple Vitamin (MULTIVITAMIN) capsule Take 1 capsule by mouth daily.        . nitroGLYCERIN (NITROSTAT) 0.3 MG SL tablet Place 0.3 mg under the tongue every 5 (five) minutes as needed.        . nystatin  (NYSTOP) 100000 UNIT/GM POWD Apply 1 application topically BID times 48H.      Marland Kitchen omeprazole (PRILOSEC) 20 MG capsule Take 1 tablet by mouth Daily.      Marland Kitchen oxybutynin (DITROPAN) 5 MG tablet Take 1 tablet by mouth Daily.      . sertraline (ZOLOFT) 50 MG tablet Take 2 tablets (100 mg total) by mouth daily.  30 tablet  11  . triamterene-hydrochlorothiazide (MAXZIDE) 75-50 MG per tablet Take 1 tablet by mouth daily.  90 tablet  1  . verapamil (CALAN-SR) 180 MG CR tablet Take 1 tablet by mouth Daily.        Review of Systems  Constitutional: Positive for fatigue. Negative for fever, chills and unexpected weight change.  HENT: Positive for congestion, rhinorrhea, voice change and postnasal drip. Negative for hearing loss, ear pain, nosebleeds, sore throat, facial swelling, sneezing, mouth sores, trouble swallowing, neck pain, neck stiffness, sinus pressure, tinnitus and ear discharge.   Eyes: Negative for pain, discharge, redness and visual disturbance.  Respiratory: Positive for cough, shortness of breath and wheezing. Negative for chest tightness and stridor.   Cardiovascular: Negative for chest pain, palpitations and leg swelling.  Gastrointestinal:  Negative for abdominal pain and diarrhea.  Musculoskeletal: Negative for myalgias and arthralgias.  Skin: Negative for color change and rash.  Neurological: Negative for dizziness, weakness, light-headedness and headaches.  Hematological: Negative for adenopathy.   BP 122/72  Pulse 73  Temp(Src) 98.7 F (37.1 C) (Oral)  Wt 204 lb (92.534 kg)  SpO2 92%     Objective:   Physical Exam  Constitutional: She is oriented to person, place, and time. She appears well-developed and well-nourished. No distress.  HENT:  Head: Normocephalic and atraumatic.  Right Ear: External ear normal.  Left Ear: External ear normal.  Nose: Nose normal.  Mouth/Throat: Oropharynx is clear and moist. No oropharyngeal exudate.  Eyes: Conjunctivae are normal. Pupils are  equal, round, and reactive to light. Right eye exhibits no discharge. Left eye exhibits no discharge. No scleral icterus.  Neck: Normal range of motion. Neck supple. No tracheal deviation present. No thyromegaly present.  Cardiovascular: Normal rate, regular rhythm, normal heart sounds and intact distal pulses.  Exam reveals no gallop and no friction rub.   No murmur heard. Pulmonary/Chest: Effort normal. No accessory muscle usage. Not tachypneic. No respiratory distress. She has decreased breath sounds (prolonged expiration). She has wheezes. She has no rales. She exhibits no tenderness.  Musculoskeletal: Normal range of motion. She exhibits no edema and no tenderness.  Lymphadenopathy:    She has no cervical adenopathy.  Neurological: She is alert and oriented to person, place, and time. No cranial nerve deficit. She exhibits normal muscle tone. Coordination normal.  Skin: Skin is warm and dry. No rash noted. She is not diaphoretic. No erythema. No pallor.  Psychiatric: She has a normal mood and affect. Her behavior is normal. Judgment and thought content normal.          Assessment & Plan:  1. Bronchitis - Initial viral URI which has now developed into bronchitis.  Will treat with azithromycin. Will use albuterol as needed for dyspnea and cough.  I recommended starting steroid taper today, however she would prefer to hold off on this because of side effects.  If her symptoms of cough, wheezing, dyspnea persist, she will call and we will start prednisone taper. She will follow up in 2-4 weeks and prn.

## 2011-05-27 ENCOUNTER — Ambulatory Visit (INDEPENDENT_AMBULATORY_CARE_PROVIDER_SITE_OTHER): Payer: Medicare Other | Admitting: *Deleted

## 2011-05-27 DIAGNOSIS — E538 Deficiency of other specified B group vitamins: Secondary | ICD-10-CM | POA: Diagnosis not present

## 2011-05-27 MED ORDER — CYANOCOBALAMIN 1000 MCG/ML IJ SOLN
1000.0000 ug | Freq: Once | INTRAMUSCULAR | Status: AC
  Administered 2011-05-27: 1000 ug via INTRAMUSCULAR

## 2011-05-31 ENCOUNTER — Telehealth: Payer: Self-pay | Admitting: *Deleted

## 2011-05-31 NOTE — Telephone Encounter (Signed)
PA is required on patient's premarin 0.3mg  tab. This med is not reccommended in patients over 65. OK to proceed with PA?   PA # [id number]

## 2011-06-01 NOTE — Telephone Encounter (Signed)
It is okay. She has been on this medication for years, started by another physician. We can discuss tapering at her next visit.

## 2011-06-06 NOTE — Telephone Encounter (Signed)
Form being faxed.

## 2011-06-06 NOTE — Telephone Encounter (Signed)
Form pending completion from MD

## 2011-06-09 NOTE — Telephone Encounter (Signed)
Form faxed, awaiting approval.

## 2011-06-21 ENCOUNTER — Other Ambulatory Visit: Payer: Self-pay | Admitting: *Deleted

## 2011-06-21 MED ORDER — GABAPENTIN 300 MG PO CAPS: 300.0000 mg | ORAL_CAPSULE | Freq: Every day | ORAL | Status: DC

## 2011-06-27 ENCOUNTER — Ambulatory Visit (INDEPENDENT_AMBULATORY_CARE_PROVIDER_SITE_OTHER): Payer: Medicare Other | Admitting: *Deleted

## 2011-06-27 DIAGNOSIS — E538 Deficiency of other specified B group vitamins: Secondary | ICD-10-CM

## 2011-06-27 DIAGNOSIS — L02619 Cutaneous abscess of unspecified foot: Secondary | ICD-10-CM | POA: Diagnosis not present

## 2011-06-27 MED ORDER — CYANOCOBALAMIN 1000 MCG/ML IJ SOLN
1000.0000 ug | Freq: Once | INTRAMUSCULAR | Status: AC
  Administered 2011-06-27: 1000 ug via INTRAMUSCULAR

## 2011-07-05 ENCOUNTER — Telehealth: Payer: Self-pay | Admitting: *Deleted

## 2011-07-05 DIAGNOSIS — R2681 Unsteadiness on feet: Secondary | ICD-10-CM

## 2011-07-05 DIAGNOSIS — M1990 Unspecified osteoarthritis, unspecified site: Secondary | ICD-10-CM

## 2011-07-05 NOTE — Telephone Encounter (Signed)
[facility] is requesting DX code for pt's lift chair. What would dx be?

## 2011-07-05 NOTE — Telephone Encounter (Signed)
Gait instability, osteoarthritis

## 2011-07-06 DIAGNOSIS — R2681 Unsteadiness on feet: Secondary | ICD-10-CM | POA: Insufficient documentation

## 2011-07-06 DIAGNOSIS — M1990 Unspecified osteoarthritis, unspecified site: Secondary | ICD-10-CM | POA: Insufficient documentation

## 2011-07-06 NOTE — Telephone Encounter (Signed)
Pharm informed

## 2011-07-11 ENCOUNTER — Telehealth: Payer: Self-pay | Admitting: Internal Medicine

## 2011-07-11 ENCOUNTER — Telehealth: Payer: Self-pay | Admitting: *Deleted

## 2011-07-11 DIAGNOSIS — M79609 Pain in unspecified limb: Secondary | ICD-10-CM | POA: Diagnosis not present

## 2011-07-11 DIAGNOSIS — B351 Tinea unguium: Secondary | ICD-10-CM | POA: Diagnosis not present

## 2011-07-11 NOTE — Telephone Encounter (Signed)
[phone] Pt called wanted to know why rx for the lift chair has not been sent to the drug store yet [facility] Please advise pt when this is sent to drug store

## 2011-07-11 NOTE — Telephone Encounter (Signed)
RX for lift chair needed by [employer]. Handwritten, signed by MD and will be faxed to pharm this afternoon

## 2011-07-13 NOTE — Telephone Encounter (Signed)
This was completed and faxed in Tuesday, need to call pt to inform

## 2011-07-15 NOTE — Telephone Encounter (Signed)
Left VM for pt 

## 2011-07-26 ENCOUNTER — Encounter: Payer: Self-pay | Admitting: Internal Medicine

## 2011-07-26 ENCOUNTER — Ambulatory Visit (INDEPENDENT_AMBULATORY_CARE_PROVIDER_SITE_OTHER): Payer: Medicare Other | Admitting: Internal Medicine

## 2011-07-26 VITALS — BP 118/50 | HR 74 | Temp 97.8°F | Ht 63.5 in | Wt 204.0 lb

## 2011-07-26 DIAGNOSIS — I10 Essential (primary) hypertension: Secondary | ICD-10-CM

## 2011-07-26 DIAGNOSIS — D510 Vitamin B12 deficiency anemia due to intrinsic factor deficiency: Secondary | ICD-10-CM

## 2011-07-26 DIAGNOSIS — M1990 Unspecified osteoarthritis, unspecified site: Secondary | ICD-10-CM

## 2011-07-26 DIAGNOSIS — N390 Urinary tract infection, site not specified: Secondary | ICD-10-CM | POA: Diagnosis not present

## 2011-07-26 LAB — POCT URINALYSIS DIPSTICK
Blood, UA: NEGATIVE
Nitrite, UA: NEGATIVE
Spec Grav, UA: 1.02
pH, UA: 6

## 2011-07-26 LAB — COMPREHENSIVE METABOLIC PANEL
ALT: 18 U/L (ref 0–35)
AST: 31 U/L (ref 0–37)
Alkaline Phosphatase: 50 U/L (ref 39–117)
Creatinine, Ser: 1.1 mg/dL (ref 0.4–1.2)
Total Bilirubin: 0.4 mg/dL (ref 0.3–1.2)

## 2011-07-26 LAB — CBC WITH DIFFERENTIAL/PLATELET
Basophils Absolute: 0.1 10*3/uL (ref 0.0–0.1)
Basophils Relative: 0.9 % (ref 0.0–3.0)
Eosinophils Absolute: 0.4 10*3/uL (ref 0.0–0.7)
HCT: 42.3 % (ref 36.0–46.0)
Hemoglobin: 14 g/dL (ref 12.0–15.0)
Lymphs Abs: 2.9 10*3/uL (ref 0.7–4.0)
MCHC: 33.1 g/dL (ref 30.0–36.0)
Neutro Abs: 3.8 10*3/uL (ref 1.4–7.7)
RBC: 4.31 Mil/uL (ref 3.87–5.11)
RDW: 15.1 % — ABNORMAL HIGH (ref 11.5–14.6)

## 2011-07-26 MED ORDER — VERAPAMIL HCL ER 180 MG PO TBCR: 180.0000 mg | EXTENDED_RELEASE_TABLET | Freq: Every day | ORAL | Status: DC

## 2011-07-26 NOTE — Assessment & Plan Note (Signed)
Will repeat B12 shot today and plan for monthly B12 shots.

## 2011-07-26 NOTE — Progress Notes (Signed)
Subjective:    Patient ID: Cindy Graham, female    DOB: 12-20-1921, 78 y.o.   MRN: [id number]  HPI 78 year old female with history of osteoarthritis, hypertension presents for followup. She reports continued pain in her right knee even after having recent steroid injection by her orthopedic surgeon. She notes that she is not currently willing to pursue knee replacement. She is currently using Aleve as needed for pain with minimal improvement. Her pain in her right knee leads difficulty ambulating. She has not had any recent falls.  In regards to her hypertension, she reports full compliance with her medications. She denies any recent chest pain, palpitations, or shortness of breath.  She would like to repeat urinalysis today. She denies any recent fever, chills, dysuria, however does have chronic urinary frequency.  Outpatient Encounter Prescriptions as of 07/26/2011  Medication Sig Dispense Refill  . albuterol (PROVENTIL HFA;VENTOLIN HFA) 108 (90 BASE) MCG/ACT inhaler Inhale 2 puffs into the lungs every 6 (six) hours as needed for wheezing.  1 Inhaler  0  . aspirin 81 MG tablet Take 81 mg by mouth daily.        Marland Kitchen atenolol (TENORMIN) 25 MG tablet Take 1 tablet (25 mg total) by mouth daily.  30 tablet  6  . Biotin 1000 MCG tablet Take 1,000 mcg by mouth daily.        . cholecalciferol (VITAMIN D) 400 UNITS TABS Take 400 Units by mouth 2 (two) times daily.        . enalapril (VASOTEC) 20 MG tablet Take 1 tablet by mouth Twice daily.      Marland Kitchen estrogens, conjugated, (PREMARIN) 0.3 MG tablet Take 0.3 mg by mouth daily.        . Flaxseed, Linseed, (FLAX SEED OIL) 1000 MG CAPS Take 1 capsule by mouth daily.        Marland Kitchen FLOVENT HFA 110 MCG/ACT inhaler Inhale 2 puffs into the lungs daily.       Marland Kitchen gabapentin (NEURONTIN) 300 MG capsule Take 1 capsule (300 mg total) by mouth daily.  90 capsule  1  . lidocaine (LIDODERM) 5 % Place 1 patch onto the skin every 12 (twelve) hours. Remove & Discard patch within 12  hours or as directed by MD  30 patch  3  . Multiple Vitamin (MULTIVITAMIN) capsule Take 1 capsule by mouth daily.        . nitroGLYCERIN (NITROSTAT) 0.3 MG SL tablet Place 0.3 mg under the tongue every 5 (five) minutes as needed.        . nystatin (NYSTOP) 100000 UNIT/GM POWD Apply 1 application topically BID times 48H.      Marland Kitchen omeprazole (PRILOSEC) 20 MG capsule Take 1 capsule (20 mg total) by mouth daily.  90 capsule  1  . oxybutynin (DITROPAN) 5 MG tablet Take 1 tablet by mouth Daily.      . sertraline (ZOLOFT) 50 MG tablet Take 2 tablets (100 mg total) by mouth daily.  30 tablet  11  . triamterene-hydrochlorothiazide (MAXZIDE) 75-50 MG per tablet Take 1 tablet by mouth daily.  90 tablet  1  . verapamil (CALAN-SR) 180 MG CR tablet Take 1 tablet (180 mg total) by mouth at bedtime.  90 tablet  1  . DISCONTD: verapamil (CALAN-SR) 180 MG CR tablet Take 1 tablet (180 mg total) by mouth 2 (two) times daily.  180 tablet  1    Review of Systems  Constitutional: Negative for fever, chills, appetite change, fatigue and unexpected weight change.  HENT: Negative for ear pain, congestion, sore throat, trouble swallowing, neck pain, voice change and sinus pressure.   Eyes: Negative for visual disturbance.  Respiratory: Negative for cough, shortness of breath, wheezing and stridor.   Cardiovascular: Negative for chest pain, palpitations and leg swelling.  Gastrointestinal: Negative for vomiting and abdominal pain.  Genitourinary: Positive for urgency and frequency. Negative for dysuria and flank pain.  Musculoskeletal: Positive for myalgias, joint swelling, arthralgias and gait problem.  Skin: Negative for color change and rash.  Neurological: Negative for dizziness and headaches.  Hematological: Negative for adenopathy. Does not bruise/bleed easily.  Psychiatric/Behavioral: Negative for suicidal ideas, sleep disturbance and dysphoric mood. The patient is not nervous/anxious.    BP 118/50  Pulse 74   Temp(Src) 97.8 F (36.6 C) (Oral)  Ht 5' 3.5" (1.613 m)  Wt 204 lb (92.534 kg)  BMI 35.57 kg/m2  SpO2 93%     Objective:   Physical Exam  Constitutional: She is oriented to person, place, and time. She appears well-developed and well-nourished. No distress.  HENT:  Head: Normocephalic and atraumatic.  Right Ear: External ear normal.  Left Ear: External ear normal.  Nose: Nose normal.  Mouth/Throat: Oropharynx is clear and moist. No oropharyngeal exudate.  Eyes: Conjunctivae are normal. Pupils are equal, round, and reactive to light. Right eye exhibits no discharge. Left eye exhibits no discharge. No scleral icterus.  Neck: Normal range of motion. Neck supple. No tracheal deviation present. No thyromegaly present.  Cardiovascular: Normal rate, regular rhythm, normal heart sounds and intact distal pulses.  Exam reveals no gallop and no friction rub.   No murmur heard. Pulmonary/Chest: Effort normal and breath sounds normal. No respiratory distress. She has no wheezes. She has no rales. She exhibits no tenderness.  Musculoskeletal: She exhibits no edema and no tenderness.       Right knee: She exhibits decreased range of motion. tenderness found.  Lymphadenopathy:    She has no cervical adenopathy.  Neurological: She is alert and oriented to person, place, and time. No cranial nerve deficit. She exhibits normal muscle tone. Coordination normal.  Skin: Skin is warm and dry. No rash noted. She is not diaphoretic. No erythema. No pallor.  Psychiatric: She has a normal mood and affect. Her behavior is normal. Judgment and thought content normal.          Assessment & Plan:

## 2011-07-26 NOTE — Assessment & Plan Note (Signed)
Symptoms and urinalysis consistent with urinary tract infection. Will send urine for culture. Will treat with Cipro twice daily for 7 days. Patient will call or return to clinic if symptoms are not improving.

## 2011-07-26 NOTE — Assessment & Plan Note (Signed)
Blood pressure well-controlled on current medications. Will check renal function with labs today. Followup in 3 months. 

## 2011-07-26 NOTE — Assessment & Plan Note (Signed)
Persistent pain in right knee. Patient reports no improvement with recent steroid injection. She is currently not ready to pursue knee replacement. We'll continue to monitor.

## 2011-07-27 ENCOUNTER — Telehealth: Payer: Self-pay | Admitting: *Deleted

## 2011-07-27 MED ORDER — CIPROFLOXACIN HCL 250 MG PO TABS: 250.0000 mg | ORAL_TABLET | Freq: Two times a day (BID) | ORAL | Status: AC

## 2011-07-27 NOTE — Telephone Encounter (Signed)
Message copied by Vernie Murders on Wed Jul 27, 2011  6:18 PM ------      Message from: Ronna Polio A      Created: Tue Jul 26, 2011  4:04 PM       Pt denied symptoms of dysuria at her visit, but wanted to check urine to be "sure." Urine appears infected.  Can you send for culture? We should treat with Cipro 250mg  po bid x 7 days.

## 2011-07-27 NOTE — Telephone Encounter (Signed)
Rx sent in, Patient informed  

## 2011-07-29 LAB — URINE CULTURE: Colony Count: >=100000

## 2011-08-01 DIAGNOSIS — L97509 Non-pressure chronic ulcer of other part of unspecified foot with unspecified severity: Secondary | ICD-10-CM | POA: Diagnosis not present

## 2011-08-08 ENCOUNTER — Encounter: Payer: Self-pay | Admitting: Internal Medicine

## 2011-08-10 DIAGNOSIS — D239 Other benign neoplasm of skin, unspecified: Secondary | ICD-10-CM | POA: Diagnosis not present

## 2011-08-10 DIAGNOSIS — L821 Other seborrheic keratosis: Secondary | ICD-10-CM | POA: Diagnosis not present

## 2011-08-25 ENCOUNTER — Ambulatory Visit (INDEPENDENT_AMBULATORY_CARE_PROVIDER_SITE_OTHER): Payer: Medicare Other | Admitting: Internal Medicine

## 2011-08-25 DIAGNOSIS — E538 Deficiency of other specified B group vitamins: Secondary | ICD-10-CM

## 2011-08-25 MED ORDER — CYANOCOBALAMIN 1000 MCG/ML IJ SOLN
1000.0000 ug | Freq: Once | INTRAMUSCULAR | Status: AC
  Administered 2011-08-25: 1000 ug via INTRAMUSCULAR

## 2011-09-27 ENCOUNTER — Ambulatory Visit (INDEPENDENT_AMBULATORY_CARE_PROVIDER_SITE_OTHER): Payer: Medicare Other | Admitting: *Deleted

## 2011-09-27 DIAGNOSIS — E538 Deficiency of other specified B group vitamins: Secondary | ICD-10-CM

## 2011-09-27 MED ORDER — CYANOCOBALAMIN 1000 MCG/ML IJ SOLN
1000.0000 ug | Freq: Once | INTRAMUSCULAR | Status: AC
  Administered 2011-09-27: 1000 ug via INTRAMUSCULAR

## 2011-10-19 ENCOUNTER — Other Ambulatory Visit: Payer: Self-pay | Admitting: Internal Medicine

## 2011-10-26 ENCOUNTER — Ambulatory Visit (INDEPENDENT_AMBULATORY_CARE_PROVIDER_SITE_OTHER): Payer: Medicare Other | Admitting: Internal Medicine

## 2011-10-26 ENCOUNTER — Encounter: Payer: Self-pay | Admitting: Internal Medicine

## 2011-10-26 VITALS — BP 130/60 | HR 63 | Temp 98.2°F | Ht 63.5 in | Wt 206.5 lb

## 2011-10-26 DIAGNOSIS — I10 Essential (primary) hypertension: Secondary | ICD-10-CM | POA: Diagnosis not present

## 2011-10-26 DIAGNOSIS — D510 Vitamin B12 deficiency anemia due to intrinsic factor deficiency: Secondary | ICD-10-CM

## 2011-10-26 DIAGNOSIS — M1990 Unspecified osteoarthritis, unspecified site: Secondary | ICD-10-CM

## 2011-10-26 LAB — CBC WITH DIFFERENTIAL/PLATELET
Eosinophils Absolute: 0.3 10*3/uL (ref 0.0–0.7)
Eosinophils Relative: 5.5 % — ABNORMAL HIGH (ref 0.0–5.0)
Lymphocytes Relative: 34.8 % (ref 12.0–46.0)
MCHC: 32.5 g/dL (ref 30.0–36.0)
MCV: 98.6 fl (ref 78.0–100.0)
Monocytes Absolute: 0.6 10*3/uL (ref 0.1–1.0)
Neutrophils Relative %: 49.2 % (ref 43.0–77.0)
Platelets: 226 10*3/uL (ref 150.0–400.0)
WBC: 6.2 10*3/uL (ref 4.5–10.5)

## 2011-10-26 LAB — COMPREHENSIVE METABOLIC PANEL
AST: 21 U/L (ref 0–37)
Albumin: 3.5 g/dL (ref 3.5–5.2)
Alkaline Phosphatase: 59 U/L (ref 39–117)
Potassium: 4.4 mEq/L (ref 3.5–5.1)
Sodium: 140 mEq/L (ref 135–145)
Total Protein: 6.5 g/dL (ref 6.0–8.3)

## 2011-10-26 MED ORDER — CYANOCOBALAMIN 1000 MCG/ML IJ SOLN
1000.0000 ug | Freq: Once | INTRAMUSCULAR | Status: AC
  Administered 2011-10-26: 1000 ug via INTRAMUSCULAR

## 2011-10-26 MED ORDER — VERAPAMIL HCL ER 180 MG PO TBCR: 180.0000 mg | EXTENDED_RELEASE_TABLET | Freq: Every day | ORAL | Status: DC

## 2011-10-26 NOTE — Assessment & Plan Note (Signed)
Blood pressure well-controlled on current medications. Will plan to continue. Will check renal function with labs today. Patient will followup in 4 months.

## 2011-10-26 NOTE — Progress Notes (Signed)
Subjective:    Patient ID: Cindy Graham, female    DOB: 02/14/1922, 78 y.o.   MRN: [id number]  HPI 78 year old female with history of hypertension, osteoarthritis, and pernicious anemia presents for followup. She reports that she is doing generally well. She notes gradual worsening of the pain in her right knee secondary to osteoarthritis. She has been told by her orthopedic surgeon that she needs knee replacement. She wishes to defer surgery at this time. She feels that she is managing well with the use of a cane. She has not had any falls at home.  In regards to hypertension, she reports full compliance with her medications. She denies any headache, chest pain, shortness of breath. She occasionally has some dizziness when standing. This resolves after sitting still for a few minutes.  Outpatient Encounter Prescriptions as of 10/26/2011  Medication Sig Dispense Refill  . albuterol (PROVENTIL HFA;VENTOLIN HFA) 108 (90 BASE) MCG/ACT inhaler Inhale 2 puffs into the lungs every 6 (six) hours as needed for wheezing.  1 Inhaler  0  . aspirin 81 MG tablet Take 81 mg by mouth daily.        Marland Kitchen atenolol (TENORMIN) 25 MG tablet TAKE ONE (1) TABLET BY MOUTH EVERY      DAY  30 tablet  5  . Biotin 1000 MCG tablet Take 1,000 mcg by mouth daily.        . cholecalciferol (VITAMIN D) 400 UNITS TABS Take 400 Units by mouth 2 (two) times daily.        . enalapril (VASOTEC) 20 MG tablet Take 1 tablet by mouth Twice daily.      Marland Kitchen estrogens, conjugated, (PREMARIN) 0.3 MG tablet Take 0.3 mg by mouth daily.        . Flaxseed, Linseed, (FLAX SEED OIL) 1000 MG CAPS Take 1 capsule by mouth daily.        Marland Kitchen FLOVENT HFA 110 MCG/ACT inhaler Inhale 2 puffs into the lungs daily.       Marland Kitchen gabapentin (NEURONTIN) 300 MG capsule Take 1 capsule (300 mg total) by mouth daily.  90 capsule  1  . lidocaine (LIDODERM) 5 % Place 1 patch onto the skin every 12 (twelve) hours. Remove & Discard patch within 12 hours or as directed by MD  30  patch  3  . Multiple Vitamin (MULTIVITAMIN) capsule Take 1 capsule by mouth daily.        . nitroGLYCERIN (NITROSTAT) 0.3 MG SL tablet Place 0.3 mg under the tongue every 5 (five) minutes as needed.        . nystatin (NYSTOP) 100000 UNIT/GM POWD Apply 1 application topically BID times 48H.      Marland Kitchen omeprazole (PRILOSEC) 20 MG capsule Take 1 capsule (20 mg total) by mouth daily.  90 capsule  1  . oxybutynin (DITROPAN) 5 MG tablet Take 1 tablet by mouth Daily.      . sertraline (ZOLOFT) 50 MG tablet Take 2 tablets (100 mg total) by mouth daily.  30 tablet  11  . triamterene-hydrochlorothiazide (MAXZIDE) 75-50 MG per tablet TAKE ONE (1) TABLET EACH DAY  90 tablet  5  . verapamil (CALAN-SR) 180 MG CR tablet Take 1 tablet (180 mg total) by mouth at bedtime.  90 tablet  4  . DISCONTD: verapamil (CALAN-SR) 180 MG CR tablet Take 1 tablet (180 mg total) by mouth at bedtime.  90 tablet  1   Facility-Administered Encounter Medications as of 10/26/2011  Medication Dose Route Frequency Provider Last Rate  Last Dose  . cyanocobalamin ((VITAMIN B-12)) injection 1,000 mcg  1,000 mcg Intramuscular Once Shelia Media, MD   1,000 mcg at 10/26/11 1125    Review of Systems  Constitutional: Negative for fever, chills, appetite change, fatigue and unexpected weight change.  HENT: Negative for ear pain, congestion, sore throat, trouble swallowing, neck pain, voice change and sinus pressure.   Eyes: Negative for visual disturbance.  Respiratory: Negative for cough, shortness of breath, wheezing and stridor.   Cardiovascular: Negative for chest pain, palpitations and leg swelling.  Gastrointestinal: Positive for diarrhea (occasional). Negative for nausea, vomiting, abdominal pain, constipation, blood in stool, abdominal distention and anal bleeding.  Genitourinary: Negative for dysuria and flank pain.  Musculoskeletal: Positive for myalgias, arthralgias and gait problem.  Skin: Negative for color change and rash.    Neurological: Positive for light-headedness. Negative for dizziness and headaches.  Hematological: Negative for adenopathy. Does not bruise/bleed easily.  Psychiatric/Behavioral: Negative for suicidal ideas, sleep disturbance and dysphoric mood. The patient is not nervous/anxious.    BP 130/60  Pulse 63  Temp(Src) 98.2 F (36.8 C) (Oral)  Ht 5' 3.5" (1.613 m)  Wt 206 lb 8 oz (93.668 kg)  BMI 36.01 kg/m2  SpO2 93%     Objective:   Physical Exam  Constitutional: She is oriented to person, place, and time. She appears well-developed and well-nourished. No distress.  HENT:  Head: Normocephalic and atraumatic.  Right Ear: External ear normal.  Left Ear: External ear normal.  Nose: Nose normal.  Mouth/Throat: Oropharynx is clear and moist. No oropharyngeal exudate.  Eyes: Conjunctivae are normal. Pupils are equal, round, and reactive to light. Right eye exhibits no discharge. Left eye exhibits no discharge. No scleral icterus.  Neck: Normal range of motion. Neck supple. No tracheal deviation present. No thyromegaly present.  Cardiovascular: Normal rate, regular rhythm, normal heart sounds and intact distal pulses.  Exam reveals no gallop and no friction rub.   No murmur heard. Pulmonary/Chest: Effort normal and breath sounds normal. No respiratory distress. She has no wheezes. She has no rales. She exhibits no tenderness.  Musculoskeletal: She exhibits no edema and no tenderness.       Right knee: She exhibits decreased range of motion and swelling.  Lymphadenopathy:    She has no cervical adenopathy.  Neurological: She is alert and oriented to person, place, and time. No cranial nerve deficit. She exhibits normal muscle tone. Coordination normal.  Skin: Skin is warm and dry. No rash noted. She is not diaphoretic. No erythema. No pallor.  Psychiatric: She has a normal mood and affect. Her behavior is normal. Judgment and thought content normal.          Assessment & Plan:

## 2011-10-26 NOTE — Assessment & Plan Note (Signed)
B12 shot today. 

## 2011-10-26 NOTE — Assessment & Plan Note (Signed)
Patient with severe osteoarthritis of her right knee. At present, she would like to defer surgical intervention. She will continue to use cane or Cindy Graham as needed to help prevent falls. Followup when necessary.

## 2011-11-04 LAB — HM MAMMOGRAPHY: HM Mammogram: NORMAL

## 2011-11-18 ENCOUNTER — Other Ambulatory Visit: Payer: Self-pay | Admitting: Internal Medicine

## 2011-11-25 DIAGNOSIS — D3130 Benign neoplasm of unspecified choroid: Secondary | ICD-10-CM | POA: Diagnosis not present

## 2011-11-28 ENCOUNTER — Ambulatory Visit (INDEPENDENT_AMBULATORY_CARE_PROVIDER_SITE_OTHER): Payer: Medicare Other | Admitting: *Deleted

## 2011-11-28 DIAGNOSIS — D510 Vitamin B12 deficiency anemia due to intrinsic factor deficiency: Secondary | ICD-10-CM

## 2011-11-28 MED ORDER — CYANOCOBALAMIN 1000 MCG/ML IJ SOLN
1000.0000 ug | Freq: Once | INTRAMUSCULAR | Status: AC
  Administered 2011-11-28: 1000 ug via INTRAMUSCULAR

## 2011-12-15 ENCOUNTER — Ambulatory Visit: Payer: Self-pay | Admitting: Internal Medicine

## 2011-12-15 DIAGNOSIS — Z1231 Encounter for screening mammogram for malignant neoplasm of breast: Secondary | ICD-10-CM | POA: Diagnosis not present

## 2011-12-15 DIAGNOSIS — R928 Other abnormal and inconclusive findings on diagnostic imaging of breast: Secondary | ICD-10-CM | POA: Diagnosis not present

## 2011-12-20 ENCOUNTER — Other Ambulatory Visit: Payer: Self-pay | Admitting: *Deleted

## 2011-12-20 MED ORDER — FLUTICASONE PROPIONATE HFA 110 MCG/ACT IN AERO: 2.0000 | INHALATION_SPRAY | Freq: Every day | RESPIRATORY_TRACT | Status: DC

## 2011-12-23 ENCOUNTER — Encounter: Payer: Self-pay | Admitting: Internal Medicine

## 2011-12-28 ENCOUNTER — Ambulatory Visit (INDEPENDENT_AMBULATORY_CARE_PROVIDER_SITE_OTHER): Payer: Medicare Other | Admitting: Internal Medicine

## 2011-12-28 DIAGNOSIS — E538 Deficiency of other specified B group vitamins: Secondary | ICD-10-CM | POA: Diagnosis not present

## 2011-12-28 MED ORDER — CYANOCOBALAMIN 1000 MCG/ML IJ SOLN
1000.0000 ug | Freq: Once | INTRAMUSCULAR | Status: AC
  Administered 2011-12-28: 1000 ug via INTRAMUSCULAR

## 2012-01-20 ENCOUNTER — Other Ambulatory Visit: Payer: Self-pay | Admitting: *Deleted

## 2012-01-20 DIAGNOSIS — M5432 Sciatica, left side: Secondary | ICD-10-CM

## 2012-01-20 MED ORDER — LIDOCAINE 5 % EX PTCH: 1.0000 | MEDICATED_PATCH | Freq: Two times a day (BID) | CUTANEOUS | Status: DC

## 2012-01-20 MED ORDER — ESTROGENS CONJUGATED 0.3 MG PO TABS: 0.3000 mg | ORAL_TABLET | Freq: Every day | ORAL | Status: DC

## 2012-01-25 ENCOUNTER — Ambulatory Visit: Payer: Medicare Other

## 2012-02-14 DIAGNOSIS — Z23 Encounter for immunization: Secondary | ICD-10-CM | POA: Diagnosis not present

## 2012-02-17 ENCOUNTER — Other Ambulatory Visit: Payer: Self-pay | Admitting: Internal Medicine

## 2012-02-17 DIAGNOSIS — F329 Major depressive disorder, single episode, unspecified: Secondary | ICD-10-CM

## 2012-02-17 MED ORDER — SERTRALINE HCL 50 MG PO TABS: 100.0000 mg | ORAL_TABLET | Freq: Every day | ORAL | Status: DC

## 2012-03-05 ENCOUNTER — Encounter: Payer: Self-pay | Admitting: Internal Medicine

## 2012-03-05 ENCOUNTER — Ambulatory Visit (INDEPENDENT_AMBULATORY_CARE_PROVIDER_SITE_OTHER): Payer: Medicare Other | Admitting: Internal Medicine

## 2012-03-05 VITALS — BP 120/60 | HR 70 | Temp 98.5°F | Ht 63.5 in | Wt 203.8 lb

## 2012-03-05 DIAGNOSIS — D510 Vitamin B12 deficiency anemia due to intrinsic factor deficiency: Secondary | ICD-10-CM | POA: Diagnosis not present

## 2012-03-05 DIAGNOSIS — E785 Hyperlipidemia, unspecified: Secondary | ICD-10-CM | POA: Diagnosis not present

## 2012-03-05 DIAGNOSIS — Z0000 Encounter for general adult medical examination without abnormal findings: Secondary | ICD-10-CM

## 2012-03-05 LAB — LIPID PANEL
HDL: 36.4 mg/dL — ABNORMAL LOW (ref >39.00)
Total CHOL/HDL Ratio: 7
Triglycerides: 228 mg/dL — ABNORMAL HIGH (ref 0.0–149.0)

## 2012-03-05 LAB — LDL CHOLESTEROL, DIRECT: Direct LDL: 170.8 mg/dL

## 2012-03-05 LAB — COMPREHENSIVE METABOLIC PANEL
ALT: 14 U/L (ref 0–35)
AST: 22 U/L (ref 0–37)
Albumin: 3.3 g/dL — ABNORMAL LOW (ref 3.5–5.2)
Alkaline Phosphatase: 56 U/L (ref 39–117)
BUN: 32 mg/dL — ABNORMAL HIGH (ref 6–23)
Calcium: 8.9 mg/dL (ref 8.4–10.5)
Chloride: 106 mEq/L (ref 96–112)
Creatinine, Ser: 1.2 mg/dL (ref 0.4–1.2)
Potassium: 4.7 mEq/L (ref 3.5–5.1)

## 2012-03-05 LAB — CBC WITH DIFFERENTIAL/PLATELET
Basophils Relative: 0.5 % (ref 0.0–3.0)
Eosinophils Relative: 4.7 % (ref 0.0–5.0)
Lymphocytes Relative: 26.8 % (ref 12.0–46.0)
Monocytes Relative: 8.8 % (ref 3.0–12.0)
Neutrophils Relative %: 59.2 % (ref 43.0–77.0)
Platelets: 256 10*3/uL (ref 150.0–400.0)
RBC: 4.05 Mil/uL (ref 3.87–5.11)
WBC: 7.3 10*3/uL (ref 4.5–10.5)

## 2012-03-05 MED ORDER — CYANOCOBALAMIN 1000 MCG/ML IJ SOLN
1000.0000 ug | Freq: Once | INTRAMUSCULAR | Status: AC
  Administered 2012-03-05: 1000 ug via INTRAMUSCULAR

## 2012-03-05 NOTE — Progress Notes (Signed)
Subjective:    Patient ID: Cindy Graham, female    DOB: [birth year], 78 y.o.   MRN: [id number]  HPI The patient is here for annual Medicare wellness examination and management of other chronic and acute problems.   The risk factors are reflected in the social history.  The roster of all physicians providing medical care to patient - is listed in the Snapshot section of the chart.  Activities of daily living:  The patient is 100% independent in all ADLs: dressing, toileting, feeding as well as independent mobility. Has assistance taking trash out. Does not drive at night. Uses [employer] in home.  Home safety : The patient has smoke detectors in the home. Wears an alert device. They wear seatbelts.  There are no firearms at home. There is no violence in the home.   There is no risks for hepatitis, STDs or HIV. There is no history of blood transfusion. They have no travel history to infectious disease endemic areas of the world.  The patient has seen their dentist in the last six month. (Dr. Beverely Pace)  They have seen their eye doctor in the last year. (Dr. Alinda Money) Dermatologist - Dr. Adolphus Birchwood. Discussed the need for sun protection: hats, long sleeves and use of sunscreen if there is significant sun exposure.  Wears hearing aid, still has difficulty hearing TV.  They have deferred audiologic testing in the last year.    Follows relatively healthy diet. Eats full diet. Appetite good.  The benefits of regular aerobic exercise were discussed. She walks occasionally.  Depression screen: there are no signs or vegative symptoms of depression- irritability, change in appetite, anhedonia, sadness/tearfullness.  Cognitive assessment: the patient manages all their financial and personal affairs and is actively engaged. They could relate day,date,year and events.  HCPOA - son, [name]. Living Will in place.  The following portions of the patient's history were reviewed and updated as appropriate:  allergies, current medications, past family history, past medical history,  past surgical history, past social history  and problem list.  Visual acuity was not assessed per patient preference since she has regular follow up with her ophthalmologist. Hearing and body mass index were assessed and reviewed.   During the course of the visit the patient was educated and counseled about appropriate screening and preventive services including : fall prevention , diabetes screening, nutrition counseling, colorectal cancer screening, and recommended immunizations.     Outpatient Encounter Prescriptions as of 03/05/2012  Medication Sig Dispense Refill  . albuterol (PROVENTIL HFA;VENTOLIN HFA) 108 (90 BASE) MCG/ACT inhaler Inhale 2 puffs into the lungs every 6 (six) hours as needed for wheezing.  1 Inhaler  0  . aspirin 81 MG tablet Take 81 mg by mouth daily.        Marland Kitchen atenolol (TENORMIN) 25 MG tablet TAKE ONE (1) TABLET BY MOUTH EVERY      DAY  30 tablet  5  . Biotin 1000 MCG tablet Take 1,000 mcg by mouth daily.        . cholecalciferol (VITAMIN D) 400 UNITS TABS Take 400 Units by mouth 2 (two) times daily.        . enalapril (VASOTEC) 20 MG tablet Take 1 tablet by mouth Twice daily.      Marland Kitchen estrogens, conjugated, (PREMARIN) 0.3 MG tablet Take 1 tablet (0.3 mg total) by mouth daily.  30 tablet  6  . Flaxseed, Linseed, (FLAX SEED OIL) 1000 MG CAPS Take 1 capsule by mouth daily.        Marland Kitchen  fluticasone (FLOVENT HFA) 110 MCG/ACT inhaler Inhale 2 puffs into the lungs daily.  1 Inhaler  6  . gabapentin (NEURONTIN) 300 MG capsule TAKE ONE (1) CAPSULE EACH DAY  90 capsule  3  . lidocaine (LIDODERM) 5 % Place 1 patch onto the skin every 12 (twelve) hours. Remove & Discard patch within 12 hours or as directed by MD  30 patch  3  . Multiple Vitamin (MULTIVITAMIN) capsule Take 1 capsule by mouth daily.        . nitroGLYCERIN (NITROSTAT) 0.3 MG SL tablet Place 0.3 mg under the tongue every 5 (five) minutes as needed.         . nystatin (NYSTOP) 100000 UNIT/GM POWD Apply 1 application topically BID times 48H.      Marland Kitchen omeprazole (PRILOSEC) 20 MG capsule TAKE ONE CAPSULE BY MOUTH DAILY  90 capsule  3  . oxybutynin (DITROPAN) 5 MG tablet Take 1 tablet by mouth Daily.      . sertraline (ZOLOFT) 50 MG tablet Take 2 tablets (100 mg total) by mouth daily.  30 tablet  11  . triamterene-hydrochlorothiazide (MAXZIDE) 75-50 MG per tablet TAKE ONE (1) TABLET EACH DAY  90 tablet  5  . verapamil (CALAN-SR) 180 MG CR tablet Take 1 tablet (180 mg total) by mouth at bedtime.  90 tablet  4   Facility-Administered Encounter Medications as of 03/05/2012  Medication Dose Route Frequency Provider Last Rate Last Dose  . cyanocobalamin ((VITAMIN B-12)) injection 1,000 mcg  1,000 mcg Intramuscular Once Shelia Media, MD   1,000 mcg at 03/05/12 1122   BP 120/60  Pulse 70  Temp 98.5 F (36.9 C) (Oral)  Ht 5' 3.5" (1.613 m)  Wt 203 lb 12 oz (92.42 kg)  BMI 35.53 kg/m2  SpO2 92%  Review of Systems  Constitutional: Negative for fever, chills, appetite change, fatigue and unexpected weight change.  HENT: Negative for ear pain, congestion, sore throat, trouble swallowing, neck pain, voice change and sinus pressure.   Eyes: Negative for visual disturbance.  Respiratory: Negative for cough, shortness of breath, wheezing and stridor.   Cardiovascular: Negative for chest pain, palpitations and leg swelling.  Gastrointestinal: Negative for nausea, vomiting, abdominal pain, diarrhea, constipation, blood in stool, abdominal distention and anal bleeding.  Genitourinary: Negative for dysuria and flank pain.  Musculoskeletal: Positive for myalgias, back pain and arthralgias. Negative for gait problem.  Skin: Negative for color change and rash.  Neurological: Negative for dizziness and headaches.  Hematological: Negative for adenopathy. Does not bruise/bleed easily.  Psychiatric/Behavioral: Negative for suicidal ideas, disturbed wake/sleep  cycle and dysphoric mood. The patient is not nervous/anxious.        Objective:   Physical Exam  Constitutional: She is oriented to person, place, and time. She appears well-developed and well-nourished. No distress.  HENT:  Head: Normocephalic and atraumatic.  Right Ear: External ear normal.  Left Ear: External ear normal.  Nose: Nose normal.  Mouth/Throat: Oropharynx is clear and moist. No oropharyngeal exudate.  Eyes: Conjunctivae normal are normal. Pupils are equal, round, and reactive to light. Right eye exhibits no discharge. Left eye exhibits no discharge. No scleral icterus.  Neck: Normal range of motion. Neck supple. No tracheal deviation present. No thyromegaly present.  Cardiovascular: Normal rate, regular rhythm, normal heart sounds and intact distal pulses.  Exam reveals no gallop and no friction rub.   No murmur heard. Pulmonary/Chest: Effort normal and breath sounds normal. No respiratory distress. She has no wheezes. She has no  rales. She exhibits no tenderness.  Musculoskeletal: Normal range of motion. She exhibits no edema and no tenderness.  Lymphadenopathy:    She has no cervical adenopathy.  Neurological: She is alert and oriented to person, place, and time. No cranial nerve deficit. She exhibits normal muscle tone. Coordination normal.  Skin: Skin is warm and dry. No rash noted. She is not diaphoretic. No erythema. No pallor.  Psychiatric: She has a normal mood and affect. Her behavior is normal. Judgment and thought content normal.          Assessment & Plan:

## 2012-03-05 NOTE — Assessment & Plan Note (Signed)
General medical exam is normal today. Health maintenance is up to date. Appropriate screening performed. Will check basic labs including CBC, CMP, lipid profile. Patient will followup in 3 months or sooner as needed.   

## 2012-03-05 NOTE — Assessment & Plan Note (Signed)
Will check lipids and LFTs with labs today. 

## 2012-03-05 NOTE — Assessment & Plan Note (Signed)
B12 shot given today  

## 2012-03-08 ENCOUNTER — Other Ambulatory Visit: Payer: Self-pay | Admitting: *Deleted

## 2012-03-08 MED ORDER — ROSUVASTATIN CALCIUM 5 MG PO TABS: 5.0000 mg | ORAL_TABLET | Freq: Every day | ORAL | Status: DC

## 2012-03-14 ENCOUNTER — Telehealth: Payer: Self-pay | Admitting: Internal Medicine

## 2012-03-14 NOTE — Telephone Encounter (Signed)
t can't take Crestor because her muscles are aching so bad she was wondering if there was anything else she could take instead.

## 2012-03-16 ENCOUNTER — Other Ambulatory Visit: Payer: Self-pay | Admitting: Internal Medicine

## 2012-03-16 NOTE — Telephone Encounter (Signed)
Called patient she is aware of the below and will keep her upcoming appt.

## 2012-03-16 NOTE — Telephone Encounter (Signed)
She should stop the Crestor and we can discuss at next visit.

## 2012-03-16 NOTE — Telephone Encounter (Signed)
Refill request for oxybutynin chloride 5 mg tab #60 Sig: take one tablet twice daily

## 2012-03-19 MED ORDER — OXYBUTYNIN CHLORIDE 5 MG PO TABS: 5.0000 mg | ORAL_TABLET | Freq: Every day | ORAL | Status: DC

## 2012-03-20 ENCOUNTER — Other Ambulatory Visit: Payer: Self-pay | Admitting: Internal Medicine

## 2012-03-20 NOTE — Telephone Encounter (Signed)
Rx was sent electronically today.

## 2012-03-20 NOTE — Telephone Encounter (Signed)
Refill request for oxybutynin chloride 5 mg tab #68 °Sig: take one tablet twice daily °

## 2012-03-21 ENCOUNTER — Other Ambulatory Visit: Payer: Self-pay | Admitting: Internal Medicine

## 2012-03-21 NOTE — Telephone Encounter (Signed)
Rx was sent in on 03/19/2012 and verified by the pharmacy that they have the Rx.

## 2012-03-21 NOTE — Telephone Encounter (Signed)
Refill request for oxybutynin chloride 5 mg tab #68 Sig: take one tablet twice daily

## 2012-04-11 DIAGNOSIS — L570 Actinic keratosis: Secondary | ICD-10-CM | POA: Diagnosis not present

## 2012-04-11 DIAGNOSIS — Z85820 Personal history of malignant melanoma of skin: Secondary | ICD-10-CM | POA: Diagnosis not present

## 2012-04-12 ENCOUNTER — Encounter: Payer: Self-pay | Admitting: Internal Medicine

## 2012-04-12 ENCOUNTER — Ambulatory Visit (INDEPENDENT_AMBULATORY_CARE_PROVIDER_SITE_OTHER): Payer: Medicare Other | Admitting: Internal Medicine

## 2012-04-12 VITALS — BP 124/64 | HR 58 | Temp 97.6°F | Resp 16 | Wt 205.0 lb

## 2012-04-12 DIAGNOSIS — D510 Vitamin B12 deficiency anemia due to intrinsic factor deficiency: Secondary | ICD-10-CM

## 2012-04-12 DIAGNOSIS — R6889 Other general symptoms and signs: Secondary | ICD-10-CM | POA: Diagnosis not present

## 2012-04-12 DIAGNOSIS — Z888 Allergy status to other drugs, medicaments and biological substances status: Secondary | ICD-10-CM

## 2012-04-12 DIAGNOSIS — E785 Hyperlipidemia, unspecified: Secondary | ICD-10-CM

## 2012-04-12 DIAGNOSIS — I10 Essential (primary) hypertension: Secondary | ICD-10-CM | POA: Diagnosis not present

## 2012-04-12 DIAGNOSIS — N1831 Chronic kidney disease, stage 3a: Secondary | ICD-10-CM | POA: Insufficient documentation

## 2012-04-12 DIAGNOSIS — Z789 Other specified health status: Secondary | ICD-10-CM

## 2012-04-12 LAB — COMPREHENSIVE METABOLIC PANEL
ALT: 18 U/L (ref 0–35)
Albumin: 3.5 g/dL (ref 3.5–5.2)
CO2: 28 mEq/L (ref 19–32)
Calcium: 9.2 mg/dL (ref 8.4–10.5)
Chloride: 105 mEq/L (ref 96–112)
GFR: 50.27 mL/min — ABNORMAL LOW (ref >60.00)
Potassium: 4.5 mEq/L (ref 3.5–5.1)
Sodium: 139 mEq/L (ref 135–145)
Total Bilirubin: 0.5 mg/dL (ref 0.3–1.2)
Total Protein: 6.7 g/dL (ref 6.0–8.3)

## 2012-04-12 MED ORDER — CYANOCOBALAMIN 1000 MCG/ML IJ SOLN
1000.0000 ug | Freq: Once | INTRAMUSCULAR | Status: AC
  Administered 2012-04-12: 1000 ug via INTRAMUSCULAR

## 2012-04-12 NOTE — Assessment & Plan Note (Signed)
Suspect that temperature intolerance may be related to thyroid dysfunction. Will check TSH with labs today.

## 2012-04-12 NOTE — Progress Notes (Signed)
Subjective:    Patient ID: Cindy Graham, female    DOB: 1922-01-23, 78 y.o.   MRN: [id number]  HPI 78 year old female with history of hypertension, hyperlipidemia, osteoarthritis presents for followup. At her last visit, she was noted to have elevated lipids. She had previously tried to Lipitor and Zocor. She has been unable to tolerate these medications because of myalgia. We decided to try Crestor. However, she was unable to tolerate 5 mg of Crestor daily because of recurrent myalgia.  She was also noted on recent lab work to have declining kidney function with dehydration. She reports she has increased her fluid intake over the last several weeks. She reports full compliance with her medications. She denies any headache, chest pain, palpitations, lower extremity edema.  She is also concerned today about some temperature intolerance. She reports that she always feels hot. She has kept her air conditioning on even though it has been chilly outside. She questions whether her thyroid function may be off.  Outpatient Encounter Prescriptions as of 04/12/2012  Medication Sig Dispense Refill  . albuterol (PROVENTIL HFA;VENTOLIN HFA) 108 (90 BASE) MCG/ACT inhaler Inhale 2 puffs into the lungs every 6 (six) hours as needed for wheezing.  1 Inhaler  0  . aspirin 81 MG tablet Take 81 mg by mouth daily.        Marland Kitchen atenolol (TENORMIN) 25 MG tablet TAKE ONE (1) TABLET BY MOUTH EVERY      DAY  30 tablet  5  . Biotin 1000 MCG tablet Take 1,000 mcg by mouth daily.        . cholecalciferol (VITAMIN D) 400 UNITS TABS Take 400 Units by mouth 2 (two) times daily.        . enalapril (VASOTEC) 20 MG tablet Take 1 tablet by mouth Twice daily.      Marland Kitchen estrogens, conjugated, (PREMARIN) 0.3 MG tablet Take 1 tablet (0.3 mg total) by mouth daily.  30 tablet  6  . Flaxseed, Linseed, (FLAX SEED OIL) 1000 MG CAPS Take 1 capsule by mouth daily.        . fluticasone (FLOVENT HFA) 110 MCG/ACT inhaler Inhale 2 puffs into the  lungs daily.  1 Inhaler  6  . gabapentin (NEURONTIN) 300 MG capsule TAKE ONE (1) CAPSULE EACH DAY  90 capsule  3  . lidocaine (LIDODERM) 5 % Place 1 patch onto the skin every 12 (twelve) hours. Remove & Discard patch within 12 hours or as directed by MD  30 patch  3  . Multiple Vitamin (MULTIVITAMIN) capsule Take 1 capsule by mouth daily.        . nitroGLYCERIN (NITROSTAT) 0.3 MG SL tablet Place 0.3 mg under the tongue every 5 (five) minutes as needed.        . nystatin (NYSTOP) 100000 UNIT/GM POWD Apply 1 application topically BID times 48H.      Marland Kitchen omeprazole (PRILOSEC) 20 MG capsule TAKE ONE CAPSULE BY MOUTH DAILY  90 capsule  3  . oxybutynin (DITROPAN) 5 MG tablet Take 1 tablet (5 mg total) by mouth daily.  30 tablet  6  . rosuvastatin (CRESTOR) 5 MG tablet Take 1 tablet (5 mg total) by mouth daily.  30 tablet  1  . sertraline (ZOLOFT) 50 MG tablet Take 2 tablets (100 mg total) by mouth daily.  30 tablet  11  . triamterene-hydrochlorothiazide (MAXZIDE) 75-50 MG per tablet TAKE ONE (1) TABLET EACH DAY  90 tablet  5  . verapamil (CALAN-SR) 180 MG CR tablet  Take 1 tablet (180 mg total) by mouth at bedtime.  90 tablet  4   Facility-Administered Encounter Medications as of 04/12/2012  Medication Dose Route Frequency Provider Last Rate Last Dose  . [COMPLETED] cyanocobalamin ((VITAMIN B-12)) injection 1,000 mcg  1,000 mcg Intramuscular Once Shelia Media, MD   1,000 mcg at 04/12/12 1317   BP 124/64  Pulse 58  Temp 97.6 F (36.4 C) (Oral)  Resp 16  Wt 205 lb (92.987 kg)  Review of Systems  Constitutional: Negative for fever, chills, appetite change, fatigue and unexpected weight change.  HENT: Negative for ear pain, congestion, sore throat, trouble swallowing, neck pain, voice change and sinus pressure.   Eyes: Negative for visual disturbance.  Respiratory: Negative for cough, shortness of breath, wheezing and stridor.   Cardiovascular: Negative for chest pain, palpitations and leg  swelling.  Gastrointestinal: Negative for nausea, vomiting, abdominal pain, diarrhea, constipation, blood in stool, abdominal distention and anal bleeding.  Genitourinary: Negative for dysuria and flank pain.  Musculoskeletal: Positive for myalgias. Negative for arthralgias and gait problem.  Skin: Negative for color change and rash.  Neurological: Negative for dizziness and headaches.  Hematological: Negative for adenopathy. Does not bruise/bleed easily.  Psychiatric/Behavioral: Negative for suicidal ideas, sleep disturbance and dysphoric mood. The patient is not nervous/anxious.        Objective:   Physical Exam  Constitutional: She is oriented to person, place, and time. She appears well-developed and well-nourished. No distress.  HENT:  Head: Normocephalic and atraumatic.  Right Ear: External ear normal.  Left Ear: External ear normal.  Nose: Nose normal.  Mouth/Throat: Oropharynx is clear and moist. No oropharyngeal exudate.  Eyes: Conjunctivae normal are normal. Pupils are equal, round, and reactive to light. Right eye exhibits no discharge. Left eye exhibits no discharge. No scleral icterus.  Neck: Normal range of motion. Neck supple. No tracheal deviation present. No thyromegaly present.  Cardiovascular: Normal rate, regular rhythm, normal heart sounds and intact distal pulses.  Exam reveals no gallop and no friction rub.   No murmur heard. Pulmonary/Chest: Effort normal and breath sounds normal. No respiratory distress. She has no wheezes. She has no rales. She exhibits no tenderness.  Musculoskeletal: Normal range of motion. She exhibits no edema and no tenderness.  Lymphadenopathy:    She has no cervical adenopathy.  Neurological: She is alert and oriented to person, place, and time. No cranial nerve deficit. She exhibits normal muscle tone. Coordination normal.  Skin: Skin is warm and dry. No rash noted. She is not diaphoretic. No erythema. No pallor.  Psychiatric: She has a  normal mood and affect. Her behavior is normal. Judgment and thought content normal.          Assessment & Plan:

## 2012-04-12 NOTE — Assessment & Plan Note (Signed)
BP well controlled on current meds. Mild dehydration noted on recent labs. We'll plan to repeat renal function again today. Continue current medications. Followup in 3 months or sooner as needed.

## 2012-04-12 NOTE — Assessment & Plan Note (Signed)
GFR 45-55 noted on labs over the last year. Likely secondary to long term HTN. Will continue to monitor. Follow up 3 months and prn.

## 2012-04-12 NOTE — Assessment & Plan Note (Signed)
Unable to tolerate statin medications. Encouraged her to follow a Mediterranean style diet and increase physical activity as much as tolerated. Note the physical activity is limited by osteoarthritis. Followup 3 months or sooner as needed.

## 2012-04-18 ENCOUNTER — Other Ambulatory Visit: Payer: Self-pay | Admitting: General Practice

## 2012-04-18 MED ORDER — ATENOLOL 25 MG PO TABS: ORAL_TABLET | ORAL | Status: DC

## 2012-04-18 NOTE — Telephone Encounter (Signed)
Med filled.  

## 2012-05-17 ENCOUNTER — Other Ambulatory Visit: Payer: Self-pay

## 2012-05-17 DIAGNOSIS — I10 Essential (primary) hypertension: Secondary | ICD-10-CM

## 2012-05-17 MED ORDER — VERAPAMIL HCL ER 180 MG PO TBCR: 180.0000 mg | EXTENDED_RELEASE_TABLET | Freq: Every day | ORAL | Status: DC

## 2012-05-17 NOTE — Telephone Encounter (Signed)
Pt is needing refill on Verapamil 180 MG CR tablet QTY 180

## 2012-05-17 NOTE — Telephone Encounter (Signed)
Med filled.  

## 2012-06-05 ENCOUNTER — Ambulatory Visit: Payer: Medicare Other | Admitting: Internal Medicine

## 2012-06-13 ENCOUNTER — Ambulatory Visit: Payer: Medicare Other | Admitting: Internal Medicine

## 2012-06-15 ENCOUNTER — Ambulatory Visit (INDEPENDENT_AMBULATORY_CARE_PROVIDER_SITE_OTHER): Payer: Medicare Other | Admitting: Internal Medicine

## 2012-06-15 ENCOUNTER — Encounter: Payer: Self-pay | Admitting: Internal Medicine

## 2012-06-15 VITALS — BP 114/68 | HR 68 | Temp 98.5°F | Resp 20 | Ht 63.5 in | Wt 202.0 lb

## 2012-06-15 DIAGNOSIS — R5381 Other malaise: Secondary | ICD-10-CM | POA: Diagnosis not present

## 2012-06-15 DIAGNOSIS — E538 Deficiency of other specified B group vitamins: Secondary | ICD-10-CM | POA: Diagnosis not present

## 2012-06-15 DIAGNOSIS — D510 Vitamin B12 deficiency anemia due to intrinsic factor deficiency: Secondary | ICD-10-CM | POA: Diagnosis not present

## 2012-06-15 DIAGNOSIS — N183 Chronic kidney disease, stage 3 unspecified: Secondary | ICD-10-CM

## 2012-06-15 DIAGNOSIS — R5383 Other fatigue: Secondary | ICD-10-CM | POA: Diagnosis not present

## 2012-06-15 DIAGNOSIS — R269 Unspecified abnormalities of gait and mobility: Secondary | ICD-10-CM | POA: Diagnosis not present

## 2012-06-15 DIAGNOSIS — I10 Essential (primary) hypertension: Secondary | ICD-10-CM

## 2012-06-15 DIAGNOSIS — R2681 Unsteadiness on feet: Secondary | ICD-10-CM

## 2012-06-15 MED ORDER — CYANOCOBALAMIN 1000 MCG/ML IJ SOLN
1000.0000 ug | Freq: Once | INTRAMUSCULAR | Status: AC
  Administered 2012-06-15: 1000 ug via INTRAMUSCULAR

## 2012-06-15 NOTE — Assessment & Plan Note (Signed)
Recent renal function 03/2012 stable. Will plan to repeat with labs in 3 months. Continue current medications.

## 2012-06-15 NOTE — Assessment & Plan Note (Signed)
BP Readings from Last 3 Encounters:  06/15/12 114/68  04/12/12 124/64  03/05/12 120/60    BP well controlled on current medication. Renal function stable 03/2012. Will continue current medication. Follow up 3 months and prn.

## 2012-06-15 NOTE — Assessment & Plan Note (Signed)
Reviewed falls prevention. No recent falls. Encouraged pt to use Cindy Graham. Encouraged her to start in seated exercise class to help with strengthening. Follow up 3 months and prn.

## 2012-06-15 NOTE — Assessment & Plan Note (Signed)
B12 injection given today. Plan for monthly B12 shots.

## 2012-06-15 NOTE — Assessment & Plan Note (Signed)
Symptoms of generalized fatigue. No focal symptoms such as dyspnea, chest pain. Recent labs including CBC, CMP, TSH normal. Sleep study in the past normal per pt report, will request records on this. Suspect deconditioning playing a major role. Encouraged her to participate in local exercise program with seated activities. Follow up 3 months and prn.

## 2012-06-15 NOTE — Progress Notes (Signed)
Subjective:    Patient ID: Cindy Graham, female    DOB: [birth year], 79 y.o.   MRN: [id number]  HPI 79 year old female with history of hypertension, osteoarthritis presents for followup. She reports she is feeling relatively well. She continues to have some daytime fatigue and decreased exercise tolerance. Evaluation in the past including testing of thyroid function, CBC, CMP, and sleep study have been normal. She acknowledges some deconditioning from sedentary lifestyle and a limited movement related to ongoing issues with osteoarthritis and pain in her back. At this point, she requires assistance with some activities of daily living such as preparing meals and getting her groceries. She denies any focal symptoms such as shortness of breath or chest pain. Aside from this, she reports she is feeling well.  Outpatient Encounter Prescriptions as of 06/15/2012  Medication Sig Dispense Refill  . aspirin 81 MG tablet Take 81 mg by mouth daily.        Marland Kitchen atenolol (TENORMIN) 25 MG tablet TAKE ONE (1) TABLET BY MOUTH EVERY      DAY  30 tablet  5  . Biotin 1000 MCG tablet Take 1,000 mcg by mouth daily.        . cholecalciferol (VITAMIN D) 400 UNITS TABS Take 400 Units by mouth 2 (two) times daily.        . enalapril (VASOTEC) 20 MG tablet Take 1 tablet by mouth Twice daily.      Marland Kitchen estrogens, conjugated, (PREMARIN) 0.3 MG tablet Take 1 tablet (0.3 mg total) by mouth daily.  30 tablet  6  . Flaxseed, Linseed, (FLAX SEED OIL) 1000 MG CAPS Take 1 capsule by mouth daily.        . fluticasone (FLOVENT HFA) 110 MCG/ACT inhaler Inhale 2 puffs into the lungs daily.  1 Inhaler  6  . gabapentin (NEURONTIN) 300 MG capsule TAKE ONE (1) CAPSULE EACH DAY  90 capsule  3  . lidocaine (LIDODERM) 5 % Place 1 patch onto the skin every 12 (twelve) hours. Remove & Discard patch within 12 hours or as directed by MD  30 patch  3  . Multiple Vitamin (MULTIVITAMIN) capsule Take 1 capsule by mouth daily.        . nitroGLYCERIN  (NITROSTAT) 0.3 MG SL tablet Place 0.3 mg under the tongue every 5 (five) minutes as needed.        . nystatin (NYSTOP) 100000 UNIT/GM POWD Apply 1 application topically BID times 48H.      Marland Kitchen omeprazole (PRILOSEC) 20 MG capsule TAKE ONE CAPSULE BY MOUTH DAILY  90 capsule  3  . oxybutynin (DITROPAN) 5 MG tablet Take 1 tablet (5 mg total) by mouth daily.  30 tablet  6  . sertraline (ZOLOFT) 50 MG tablet Take 2 tablets (100 mg total) by mouth daily.  30 tablet  11  . triamterene-hydrochlorothiazide (MAXZIDE) 75-50 MG per tablet TAKE ONE (1) TABLET EACH DAY  90 tablet  5  . verapamil (CALAN-SR) 180 MG CR tablet Take 1 tablet (180 mg total) by mouth at bedtime.  90 tablet  4   BP 114/68  Pulse 68  Temp 98.5 F (36.9 C) (Oral)  Resp 20  Ht 5' 3.5" (1.613 m)  Wt 202 lb (91.627 kg)  BMI 35.22 kg/m2  SpO2 94%  Review of Systems  Constitutional: Positive for fatigue (decreased exercise tolerance). Negative for fever, chills, appetite change and unexpected weight change.  HENT: Negative for ear pain, congestion, sore throat, trouble swallowing, neck pain, voice change and  sinus pressure.   Eyes: Negative for visual disturbance.  Respiratory: Negative for cough, shortness of breath, wheezing and stridor.   Cardiovascular: Negative for chest pain, palpitations and leg swelling.  Gastrointestinal: Negative for nausea, vomiting, abdominal pain, diarrhea, constipation, blood in stool, abdominal distention and anal bleeding.  Genitourinary: Negative for dysuria and flank pain.  Musculoskeletal: Negative for myalgias, arthralgias and gait problem.  Skin: Negative for color change and rash.  Neurological: Negative for dizziness and headaches.  Hematological: Negative for adenopathy. Does not bruise/bleed easily.  Psychiatric/Behavioral: Negative for suicidal ideas, sleep disturbance and dysphoric mood. The patient is not nervous/anxious.        Objective:   Physical Exam  Constitutional: She is  oriented to person, place, and time. She appears well-developed and well-nourished. No distress.  HENT:  Head: Normocephalic and atraumatic.  Right Ear: External ear normal.  Left Ear: External ear normal.  Nose: Nose normal.  Mouth/Throat: Oropharynx is clear and moist. No oropharyngeal exudate.  Eyes: Conjunctivae normal are normal. Pupils are equal, round, and reactive to light. Right eye exhibits no discharge. Left eye exhibits no discharge. No scleral icterus.  Neck: Normal range of motion. Neck supple. No tracheal deviation present. No thyromegaly present.  Cardiovascular: Normal rate, regular rhythm, normal heart sounds and intact distal pulses.  Exam reveals no gallop and no friction rub.   No murmur heard. Pulmonary/Chest: Effort normal and breath sounds normal. No respiratory distress. She has no wheezes. She has no rales. She exhibits no tenderness.  Musculoskeletal: Normal range of motion. She exhibits no edema and no tenderness.  Lymphadenopathy:    She has no cervical adenopathy.  Neurological: She is alert and oriented to person, place, and time. No cranial nerve deficit. She exhibits normal muscle tone. Coordination normal.  Skin: Skin is warm and dry. No rash noted. She is not diaphoretic. No erythema. No pallor.  Psychiatric: She has a normal mood and affect. Her behavior is normal. Judgment and thought content normal.          Assessment & Plan:

## 2012-07-17 ENCOUNTER — Ambulatory Visit: Payer: Medicare Other

## 2012-07-17 ENCOUNTER — Ambulatory Visit (INDEPENDENT_AMBULATORY_CARE_PROVIDER_SITE_OTHER): Payer: Medicare Other | Admitting: *Deleted

## 2012-07-17 DIAGNOSIS — D510 Vitamin B12 deficiency anemia due to intrinsic factor deficiency: Secondary | ICD-10-CM | POA: Diagnosis not present

## 2012-07-17 MED ORDER — CYANOCOBALAMIN 1000 MCG/ML IJ SOLN
1000.0000 ug | Freq: Once | INTRAMUSCULAR | Status: AC
  Administered 2012-07-17: 1000 ug via INTRAMUSCULAR

## 2012-09-13 ENCOUNTER — Ambulatory Visit (INDEPENDENT_AMBULATORY_CARE_PROVIDER_SITE_OTHER): Payer: Medicare Other | Admitting: Internal Medicine

## 2012-09-13 ENCOUNTER — Encounter: Payer: Self-pay | Admitting: Internal Medicine

## 2012-09-13 VITALS — BP 126/56 | HR 67 | Temp 98.0°F | Wt 210.0 lb

## 2012-09-13 DIAGNOSIS — D510 Vitamin B12 deficiency anemia due to intrinsic factor deficiency: Secondary | ICD-10-CM

## 2012-09-13 DIAGNOSIS — M1990 Unspecified osteoarthritis, unspecified site: Secondary | ICD-10-CM | POA: Diagnosis not present

## 2012-09-13 DIAGNOSIS — I10 Essential (primary) hypertension: Secondary | ICD-10-CM

## 2012-09-13 LAB — COMPREHENSIVE METABOLIC PANEL
Albumin: 3.4 g/dL — ABNORMAL LOW (ref 3.5–5.2)
BUN: 33 mg/dL — ABNORMAL HIGH (ref 6–23)
CO2: 27 mEq/L (ref 19–32)
GFR: 40.98 mL/min — ABNORMAL LOW (ref >60.00)
Glucose, Bld: 138 mg/dL — ABNORMAL HIGH (ref 70–99)
Potassium: 4.6 mEq/L (ref 3.5–5.1)
Sodium: 140 mEq/L (ref 135–145)
Total Protein: 6.6 g/dL (ref 6.0–8.3)

## 2012-09-13 NOTE — Assessment & Plan Note (Signed)
History of pernicious anemia. Previously on B12 injections. However, national shortage of injections at this point. Will start sublingual B12 2000 mcg daily.

## 2012-09-13 NOTE — Progress Notes (Signed)
Subjective:    Patient ID: Cindy Graham, female    DOB: [birth year], 79 y.o.   MRN: [id number]  HPI 79 year old female with history of hypertension, osteoarthritis, chronic kidney disease presents for followup. She reports she is generally been feeling well. She has no concerns today. She is compliant with her medications. She notes ongoing symptoms of pain in her knees which is worsened with increased physical activity. She reports no improvement with recent steroid injections. She continues to use Tylenol as needed for pain with some improvement.  Outpatient Encounter Prescriptions as of 09/13/2012  Medication Sig Dispense Refill  . aspirin 81 MG tablet Take 81 mg by mouth daily.        Marland Kitchen atenolol (TENORMIN) 25 MG tablet TAKE ONE (1) TABLET BY MOUTH EVERY      DAY  30 tablet  5  . Biotin 1000 MCG tablet Take 1,000 mcg by mouth daily.        . cholecalciferol (VITAMIN D) 400 UNITS TABS Take 400 Units by mouth 2 (two) times daily.        . enalapril (VASOTEC) 20 MG tablet Take 1 tablet by mouth Twice daily.      Marland Kitchen estrogens, conjugated, (PREMARIN) 0.3 MG tablet Take 1 tablet (0.3 mg total) by mouth daily.  30 tablet  6  . Flaxseed, Linseed, (FLAX SEED OIL) 1000 MG CAPS Take 1 capsule by mouth daily.        . fluticasone (FLOVENT HFA) 110 MCG/ACT inhaler Inhale 2 puffs into the lungs daily.  1 Inhaler  6  . gabapentin (NEURONTIN) 300 MG capsule TAKE ONE (1) CAPSULE EACH DAY  90 capsule  3  . lidocaine (LIDODERM) 5 % Place 1 patch onto the skin every 12 (twelve) hours. Remove & Discard patch within 12 hours or as directed by MD  30 patch  3  . Multiple Vitamin (MULTIVITAMIN) capsule Take 1 capsule by mouth daily.        . nitroGLYCERIN (NITROSTAT) 0.3 MG SL tablet Place 0.3 mg under the tongue every 5 (five) minutes as needed.        . nystatin (NYSTOP) 100000 UNIT/GM POWD Apply 1 application topically BID times 48H.      Marland Kitchen omeprazole (PRILOSEC) 20 MG capsule TAKE ONE CAPSULE BY MOUTH DAILY  90  capsule  3  . oxybutynin (DITROPAN) 5 MG tablet Take 1 tablet (5 mg total) by mouth daily.  30 tablet  6  . sertraline (ZOLOFT) 50 MG tablet Take 2 tablets (100 mg total) by mouth daily.  30 tablet  11  . triamterene-hydrochlorothiazide (MAXZIDE) 75-50 MG per tablet TAKE ONE (1) TABLET EACH DAY  90 tablet  5  . verapamil (CALAN-SR) 180 MG CR tablet Take 1 tablet (180 mg total) by mouth at bedtime.  90 tablet  4   No facility-administered encounter medications on file as of 09/13/2012.   BP 126/56  Pulse 67  Temp(Src) 98 F (36.7 C) (Oral)  Wt 210 lb (95.255 kg)  BMI 36.61 kg/m2  SpO2 91%   Review of Systems  Constitutional: Negative for fever, chills, appetite change, fatigue and unexpected weight change.  HENT: Negative for ear pain, congestion, sore throat, trouble swallowing, neck pain, voice change and sinus pressure.   Eyes: Negative for visual disturbance.  Respiratory: Negative for cough, shortness of breath, wheezing and stridor.   Cardiovascular: Negative for chest pain, palpitations and leg swelling.  Gastrointestinal: Negative for nausea, vomiting, abdominal pain, diarrhea, constipation, blood in  stool, abdominal distention and anal bleeding.  Genitourinary: Negative for dysuria and flank pain.  Musculoskeletal: Positive for arthralgias. Negative for myalgias and gait problem.  Skin: Negative for color change and rash.  Neurological: Negative for dizziness and headaches.  Hematological: Negative for adenopathy. Does not bruise/bleed easily.  Psychiatric/Behavioral: Negative for suicidal ideas, sleep disturbance and dysphoric mood. The patient is not nervous/anxious.        Objective:   Physical Exam  Constitutional: She is oriented to person, place, and time. She appears well-developed and well-nourished. No distress.  HENT:  Head: Normocephalic and atraumatic.  Right Ear: External ear normal.  Left Ear: External ear normal.  Nose: Nose normal.  Mouth/Throat:  Oropharynx is clear and moist. No oropharyngeal exudate.  Eyes: Conjunctivae are normal. Pupils are equal, round, and reactive to light. Right eye exhibits no discharge. Left eye exhibits no discharge. No scleral icterus.  Neck: Normal range of motion. Neck supple. No tracheal deviation present. No thyromegaly present.  Cardiovascular: Normal rate, regular rhythm, normal heart sounds and intact distal pulses.  Exam reveals no gallop and no friction rub.   No murmur heard. Pulmonary/Chest: Effort normal and breath sounds normal. No respiratory distress. She has no wheezes. She has no rales. She exhibits no tenderness.  Musculoskeletal: She exhibits no edema and no tenderness.       Right knee: She exhibits decreased range of motion. She exhibits no swelling and no effusion.       Left knee: She exhibits decreased range of motion. She exhibits no swelling and no effusion.  Lymphadenopathy:    She has no cervical adenopathy.  Neurological: She is alert and oriented to person, place, and time. No cranial nerve deficit. She exhibits normal muscle tone. Coordination normal.  Skin: Skin is warm and dry. No rash noted. She is not diaphoretic. No erythema. No pallor.  Psychiatric: She has a normal mood and affect. Her behavior is normal. Judgment and thought content normal.          Assessment & Plan:

## 2012-09-13 NOTE — Assessment & Plan Note (Signed)
Slight decline in GFR appears to be secondary to dehydration. Increase fluid intake. Repeat BMP in 1 month.

## 2012-09-13 NOTE — Assessment & Plan Note (Signed)
BP Readings from Last 3 Encounters:  09/13/12 126/56  06/15/12 114/68  04/12/12 124/64   Blood pressure well-controlled on current medications. Will continue. Slight decline in kidney function noted today appears to be most consistent with dehydration. Encouraged her to increase fluid intake. Repeat renal function in one month.

## 2012-09-13 NOTE — Assessment & Plan Note (Signed)
Symptoms stable with chronic mild pain bilateral knees. No improvement with steroid injection. Will continue prn Tylenol. Pt will call if symptoms worsening.

## 2012-09-17 ENCOUNTER — Other Ambulatory Visit: Payer: Self-pay | Admitting: *Deleted

## 2012-09-17 DIAGNOSIS — M5432 Sciatica, left side: Secondary | ICD-10-CM

## 2012-09-17 MED ORDER — ESTROGENS CONJUGATED 0.3 MG PO TABS: 0.3000 mg | ORAL_TABLET | Freq: Every day | ORAL | Status: DC

## 2012-09-17 MED ORDER — OMEPRAZOLE 20 MG PO CPDR: 20.0000 mg | DELAYED_RELEASE_CAPSULE | Freq: Every day | ORAL | Status: DC

## 2012-09-17 NOTE — Telephone Encounter (Signed)
Ok to refill 

## 2012-09-17 NOTE — Telephone Encounter (Signed)
Fine to refill. Her insurance company will likely not pay for the estrogen, as it is not recommended in women her age.  We can try tapering down on this if she would like.

## 2012-09-18 MED ORDER — LIDOCAINE 5 % EX PTCH: 1.0000 | MEDICATED_PATCH | Freq: Two times a day (BID) | CUTANEOUS | Status: DC

## 2012-09-18 NOTE — Telephone Encounter (Signed)
Eprescribed.

## 2012-10-09 DIAGNOSIS — L821 Other seborrheic keratosis: Secondary | ICD-10-CM | POA: Diagnosis not present

## 2012-10-09 DIAGNOSIS — Z85820 Personal history of malignant melanoma of skin: Secondary | ICD-10-CM | POA: Diagnosis not present

## 2012-10-09 DIAGNOSIS — L570 Actinic keratosis: Secondary | ICD-10-CM | POA: Diagnosis not present

## 2012-10-16 ENCOUNTER — Other Ambulatory Visit (INDEPENDENT_AMBULATORY_CARE_PROVIDER_SITE_OTHER): Payer: Medicare Other

## 2012-10-16 ENCOUNTER — Ambulatory Visit (INDEPENDENT_AMBULATORY_CARE_PROVIDER_SITE_OTHER): Payer: Medicare Other | Admitting: *Deleted

## 2012-10-16 DIAGNOSIS — E538 Deficiency of other specified B group vitamins: Secondary | ICD-10-CM | POA: Diagnosis not present

## 2012-10-16 DIAGNOSIS — I10 Essential (primary) hypertension: Secondary | ICD-10-CM

## 2012-10-16 LAB — BASIC METABOLIC PANEL
Calcium: 9.1 mg/dL (ref 8.4–10.5)
GFR: 48.17 mL/min — ABNORMAL LOW (ref >60.00)
Sodium: 140 mEq/L (ref 135–145)

## 2012-10-16 MED ORDER — CYANOCOBALAMIN 1000 MCG/ML IJ SOLN
1000.0000 ug | Freq: Once | INTRAMUSCULAR | Status: AC
  Administered 2012-10-16: 1000 ug via INTRAMUSCULAR

## 2012-10-17 ENCOUNTER — Other Ambulatory Visit: Payer: Self-pay | Admitting: *Deleted

## 2012-10-17 MED ORDER — OXYBUTYNIN CHLORIDE 5 MG PO TABS: 5.0000 mg | ORAL_TABLET | Freq: Every day | ORAL | Status: DC

## 2012-11-16 ENCOUNTER — Other Ambulatory Visit: Payer: Self-pay | Admitting: *Deleted

## 2012-11-16 MED ORDER — ATENOLOL 25 MG PO TABS: ORAL_TABLET | ORAL | Status: DC

## 2012-11-16 MED ORDER — TRIAMTERENE-HCTZ 75-50 MG PO TABS: ORAL_TABLET | ORAL | Status: DC

## 2012-11-16 MED ORDER — GABAPENTIN 300 MG PO CAPS: ORAL_CAPSULE | ORAL | Status: DC

## 2012-11-16 NOTE — Telephone Encounter (Signed)
Eprescribed.

## 2012-11-26 DIAGNOSIS — L97509 Non-pressure chronic ulcer of other part of unspecified foot with unspecified severity: Secondary | ICD-10-CM | POA: Diagnosis not present

## 2012-12-13 ENCOUNTER — Telehealth: Payer: Self-pay | Admitting: Internal Medicine

## 2012-12-13 NOTE — Telephone Encounter (Signed)
Form completed and mailed to patient

## 2012-12-13 NOTE — Telephone Encounter (Signed)
Pt dropped off renewal for handicap placard In box Please mail back to patient

## 2012-12-17 ENCOUNTER — Other Ambulatory Visit: Payer: Self-pay | Admitting: *Deleted

## 2012-12-17 MED ORDER — TRIAMTERENE-HCTZ 75-50 MG PO TABS: ORAL_TABLET | ORAL | Status: DC

## 2012-12-17 NOTE — Telephone Encounter (Signed)
Eprescribed.

## 2013-01-07 ENCOUNTER — Ambulatory Visit: Payer: Self-pay | Admitting: Internal Medicine

## 2013-01-07 DIAGNOSIS — Z1231 Encounter for screening mammogram for malignant neoplasm of breast: Secondary | ICD-10-CM | POA: Diagnosis not present

## 2013-01-14 ENCOUNTER — Encounter: Payer: Self-pay | Admitting: Internal Medicine

## 2013-01-14 DIAGNOSIS — D3130 Benign neoplasm of unspecified choroid: Secondary | ICD-10-CM | POA: Diagnosis not present

## 2013-01-15 ENCOUNTER — Telehealth: Payer: Self-pay | Admitting: Internal Medicine

## 2013-01-15 MED ORDER — NYSTATIN 100000 UNIT/GM EX POWD: CUTANEOUS | Status: DC

## 2013-01-15 NOTE — Telephone Encounter (Signed)
nystatin (NYSTOP) 100000 UNIT/GM POWD

## 2013-01-15 NOTE — Telephone Encounter (Signed)
Rx sent to pharmacy on file per patient request.  

## 2013-01-18 ENCOUNTER — Other Ambulatory Visit: Payer: Self-pay | Admitting: *Deleted

## 2013-01-22 ENCOUNTER — Other Ambulatory Visit: Payer: Self-pay | Admitting: *Deleted

## 2013-01-22 MED ORDER — ATENOLOL 25 MG PO TABS: ORAL_TABLET | ORAL | Status: DC

## 2013-01-22 NOTE — Telephone Encounter (Signed)
Eprescribed.

## 2013-02-06 DIAGNOSIS — L02619 Cutaneous abscess of unspecified foot: Secondary | ICD-10-CM | POA: Diagnosis not present

## 2013-02-18 ENCOUNTER — Other Ambulatory Visit: Payer: Self-pay | Admitting: *Deleted

## 2013-02-18 DIAGNOSIS — F329 Major depressive disorder, single episode, unspecified: Secondary | ICD-10-CM

## 2013-02-18 MED ORDER — SERTRALINE HCL 50 MG PO TABS: 100.0000 mg | ORAL_TABLET | Freq: Every day | ORAL | Status: DC

## 2013-02-18 MED ORDER — FLUTICASONE PROPIONATE HFA 110 MCG/ACT IN AERO: 2.0000 | INHALATION_SPRAY | Freq: Every day | RESPIRATORY_TRACT | Status: DC

## 2013-03-12 ENCOUNTER — Telehealth: Payer: Self-pay | Admitting: Internal Medicine

## 2013-03-12 DIAGNOSIS — M549 Dorsalgia, unspecified: Secondary | ICD-10-CM | POA: Diagnosis not present

## 2013-03-12 DIAGNOSIS — G5710 Meralgia paresthetica, unspecified lower limb: Secondary | ICD-10-CM | POA: Diagnosis not present

## 2013-03-12 NOTE — Telephone Encounter (Signed)
Pt states she needs to be seen today by Dr. Dan Humphreys.  Says she has pain in her back and down her leg and needs to be seen today.  Pt is asking to be worked in.  Pt states she should have come in a long time ago.

## 2013-03-12 NOTE — Telephone Encounter (Signed)
Fwd to Dr. Walker 

## 2013-03-12 NOTE — Telephone Encounter (Signed)
Patient Information:  Caller Name: Jaanai  Phone: [phone]  Patient: Cindy Graham, Cindy Graham  Gender: Female  DOB: 11/09/1921  Age: 79 Years  PCP: Ronna Polio (Adults only)  Office Follow Up:  Does the office need to follow up with this patient?: No  Instructions For The Office: N/A   Symptoms  Reason For Call & Symptoms: pt reports she has been diagnosed with spinal stenosis.  pt states pain is different and it is worse (around her side and down her leg) Pt rates her pain as 9/10  Reviewed Health History In EMR: Yes  Reviewed Medications In EMR: Yes  Reviewed Allergies In EMR: Yes  Reviewed Surgeries / Procedures: Yes  Date of Onset of Symptoms: 02/26/2013  Treatments Tried: aleve  Treatments Tried Worked: No  Guideline(s) Used:  Back Pain  Disposition Per Guideline:   See Today in Office  Reason For Disposition Reached:   Tingling or numbness in the legs or feet  Advice Given:  N/A  Patient Will Follow Care Advice:  YES ** no appt found in the office today.  RN offered to look at other [facility] offices for pt to be seen but pt declined.  Pt states she will [facility] to be [facility].

## 2013-03-13 ENCOUNTER — Encounter: Payer: Medicare Other | Admitting: Internal Medicine

## 2013-03-14 ENCOUNTER — Telehealth: Payer: Self-pay | Admitting: Internal Medicine

## 2013-03-14 ENCOUNTER — Observation Stay: Payer: Self-pay | Admitting: Internal Medicine

## 2013-03-14 DIAGNOSIS — R0789 Other chest pain: Secondary | ICD-10-CM | POA: Diagnosis not present

## 2013-03-14 DIAGNOSIS — Z7982 Long term (current) use of aspirin: Secondary | ICD-10-CM | POA: Diagnosis not present

## 2013-03-14 DIAGNOSIS — N390 Urinary tract infection, site not specified: Secondary | ICD-10-CM | POA: Diagnosis not present

## 2013-03-14 DIAGNOSIS — M5126 Other intervertebral disc displacement, lumbar region: Secondary | ICD-10-CM | POA: Diagnosis not present

## 2013-03-14 DIAGNOSIS — Z823 Family history of stroke: Secondary | ICD-10-CM | POA: Diagnosis not present

## 2013-03-14 DIAGNOSIS — R52 Pain, unspecified: Secondary | ICD-10-CM | POA: Diagnosis not present

## 2013-03-14 DIAGNOSIS — IMO0002 Reserved for concepts with insufficient information to code with codable children: Secondary | ICD-10-CM | POA: Diagnosis not present

## 2013-03-14 DIAGNOSIS — M549 Dorsalgia, unspecified: Secondary | ICD-10-CM | POA: Diagnosis not present

## 2013-03-14 DIAGNOSIS — Z7902 Long term (current) use of antithrombotics/antiplatelets: Secondary | ICD-10-CM | POA: Diagnosis not present

## 2013-03-14 DIAGNOSIS — K219 Gastro-esophageal reflux disease without esophagitis: Secondary | ICD-10-CM | POA: Diagnosis not present

## 2013-03-14 DIAGNOSIS — Z880 Allergy status to penicillin: Secondary | ICD-10-CM | POA: Diagnosis not present

## 2013-03-14 DIAGNOSIS — M76899 Other specified enthesopathies of unspecified lower limb, excluding foot: Secondary | ICD-10-CM | POA: Diagnosis not present

## 2013-03-14 DIAGNOSIS — I10 Essential (primary) hypertension: Secondary | ICD-10-CM | POA: Diagnosis not present

## 2013-03-14 DIAGNOSIS — E785 Hyperlipidemia, unspecified: Secondary | ICD-10-CM | POA: Diagnosis not present

## 2013-03-14 DIAGNOSIS — Z9109 Other allergy status, other than to drugs and biological substances: Secondary | ICD-10-CM | POA: Diagnosis not present

## 2013-03-14 DIAGNOSIS — R011 Cardiac murmur, unspecified: Secondary | ICD-10-CM | POA: Diagnosis not present

## 2013-03-14 DIAGNOSIS — R079 Chest pain, unspecified: Secondary | ICD-10-CM | POA: Diagnosis not present

## 2013-03-14 DIAGNOSIS — I509 Heart failure, unspecified: Secondary | ICD-10-CM | POA: Diagnosis not present

## 2013-03-14 DIAGNOSIS — E871 Hypo-osmolality and hyponatremia: Secondary | ICD-10-CM | POA: Diagnosis not present

## 2013-03-14 DIAGNOSIS — Z881 Allergy status to other antibiotic agents status: Secondary | ICD-10-CM | POA: Diagnosis not present

## 2013-03-14 DIAGNOSIS — J45909 Unspecified asthma, uncomplicated: Secondary | ICD-10-CM | POA: Diagnosis not present

## 2013-03-14 LAB — URINALYSIS, COMPLETE
Bilirubin,UR: NEGATIVE
Blood: NEGATIVE
Glucose,UR: NEGATIVE mg/dL (ref 0–75)
Ketone: NEGATIVE
Nitrite: NEGATIVE
Ph: 7 (ref 4.5–8.0)
RBC,UR: 3 /HPF (ref 0–5)
WBC UR: 18 /HPF (ref 0–5)

## 2013-03-14 LAB — COMPREHENSIVE METABOLIC PANEL
Alkaline Phosphatase: 71 U/L (ref 50–136)
Anion Gap: 6 — ABNORMAL LOW (ref 7–16)
BUN: 22 mg/dL — ABNORMAL HIGH (ref 7–18)
Bilirubin,Total: 0.4 mg/dL (ref 0.2–1.0)
Creatinine: 1.15 mg/dL (ref 0.60–1.30)
EGFR (African American): 49 — ABNORMAL LOW
EGFR (Non-African Amer.): 42 — ABNORMAL LOW
Glucose: 110 mg/dL — ABNORMAL HIGH (ref 65–99)
Osmolality: 274 (ref 275–301)
Sodium: 135 mmol/L — ABNORMAL LOW (ref 136–145)

## 2013-03-14 LAB — CBC WITH DIFFERENTIAL/PLATELET
Eosinophil %: 1.5 %
HCT: 42.1 % (ref 35.0–47.0)
HGB: 14 g/dL (ref 12.0–16.0)
MCH: 32.4 pg (ref 26.0–34.0)
MCHC: 33.3 g/dL (ref 32.0–36.0)
Monocyte %: 8.3 %
Neutrophil %: 65.2 %

## 2013-03-14 LAB — HEMOGLOBIN A1C: Hemoglobin A1C: 6 % (ref 4.2–6.3)

## 2013-03-14 LAB — CK TOTAL AND CKMB (NOT AT ARMC): CK, Total: 68 U/L (ref 21–215)

## 2013-03-14 NOTE — Telephone Encounter (Signed)
States pt is sick, vomiting.  No fever.  No appt available this afternoon.  Transferred to triage.

## 2013-03-14 NOTE — Telephone Encounter (Signed)
Returned the call to the number listed to call, I spoke with Dan's wife Cindy Graham. She stated that Cindy Graham, Cindy Graham son had left to go check on her and she tried calling the patient house but no answer, thinks he may have taken her to the urgent care. Phone call was transferred to the triage nurse.

## 2013-03-15 DIAGNOSIS — R079 Chest pain, unspecified: Secondary | ICD-10-CM | POA: Diagnosis not present

## 2013-03-15 DIAGNOSIS — I509 Heart failure, unspecified: Secondary | ICD-10-CM | POA: Diagnosis not present

## 2013-03-15 DIAGNOSIS — I517 Cardiomegaly: Secondary | ICD-10-CM | POA: Diagnosis not present

## 2013-03-15 DIAGNOSIS — I10 Essential (primary) hypertension: Secondary | ICD-10-CM | POA: Diagnosis not present

## 2013-03-15 DIAGNOSIS — M549 Dorsalgia, unspecified: Secondary | ICD-10-CM | POA: Diagnosis not present

## 2013-03-15 DIAGNOSIS — N390 Urinary tract infection, site not specified: Secondary | ICD-10-CM | POA: Diagnosis not present

## 2013-03-15 LAB — CBC WITH DIFFERENTIAL/PLATELET
Basophil %: 0.9 %
Eosinophil #: 0.1 10*3/uL (ref 0.0–0.7)
HCT: 40.6 % (ref 35.0–47.0)
HGB: 13.7 g/dL (ref 12.0–16.0)
Lymphocyte #: 2 10*3/uL (ref 1.0–3.6)
Monocyte %: 9.9 %
Neutrophil #: 4.5 10*3/uL (ref 1.4–6.5)
Neutrophil %: 60.9 %
RBC: 4.2 10*6/uL (ref 3.80–5.20)
WBC: 7.4 10*3/uL (ref 3.6–11.0)

## 2013-03-15 LAB — COMPREHENSIVE METABOLIC PANEL
Albumin: 3.3 g/dL — ABNORMAL LOW (ref 3.4–5.0)
Anion Gap: 4 — ABNORMAL LOW (ref 7–16)
BUN: 20 mg/dL — ABNORMAL HIGH (ref 7–18)
Bilirubin,Total: 0.4 mg/dL (ref 0.2–1.0)
Calcium, Total: 9.7 mg/dL (ref 8.5–10.1)
EGFR (Non-African Amer.): 46 — ABNORMAL LOW
Glucose: 118 mg/dL — ABNORMAL HIGH (ref 65–99)
Osmolality: 279 (ref 275–301)
Potassium: 3.9 mmol/L (ref 3.5–5.1)
SGOT(AST): 28 U/L (ref 15–37)
SGPT (ALT): 19 U/L (ref 12–78)

## 2013-03-15 LAB — CK TOTAL AND CKMB (NOT AT ARMC)
CK, Total: 83 U/L (ref 21–215)
CK, Total: 106 U/L (ref 21–215)
CK-MB: 2.2 ng/mL (ref 0.5–3.6)

## 2013-03-15 LAB — LIPID PANEL
Cholesterol: 238 mg/dL — ABNORMAL HIGH (ref 0–200)
Triglycerides: 160 mg/dL (ref 0–200)
VLDL Cholesterol, Calc: 32 mg/dL (ref 5–40)

## 2013-03-15 NOTE — Telephone Encounter (Signed)
Left message to call back  

## 2013-03-18 ENCOUNTER — Other Ambulatory Visit: Payer: Self-pay | Admitting: *Deleted

## 2013-03-18 DIAGNOSIS — N390 Urinary tract infection, site not specified: Secondary | ICD-10-CM | POA: Diagnosis not present

## 2013-03-18 DIAGNOSIS — K219 Gastro-esophageal reflux disease without esophagitis: Secondary | ICD-10-CM | POA: Diagnosis not present

## 2013-03-18 DIAGNOSIS — I509 Heart failure, unspecified: Secondary | ICD-10-CM | POA: Diagnosis not present

## 2013-03-18 DIAGNOSIS — J45909 Unspecified asthma, uncomplicated: Secondary | ICD-10-CM | POA: Diagnosis not present

## 2013-03-18 DIAGNOSIS — M159 Polyosteoarthritis, unspecified: Secondary | ICD-10-CM | POA: Diagnosis not present

## 2013-03-18 DIAGNOSIS — I10 Essential (primary) hypertension: Secondary | ICD-10-CM | POA: Diagnosis not present

## 2013-03-18 MED ORDER — TRIAMTERENE-HCTZ 75-50 MG PO TABS: ORAL_TABLET | ORAL | Status: DC

## 2013-03-18 NOTE — Telephone Encounter (Signed)
Eprescribed.

## 2013-03-18 NOTE — Telephone Encounter (Signed)
Patient has been scheduled to see Dr. Dan Humphreys on 10/28. Spoke with patient she think she is doing pretty and good and will see [place] tomorrow at her appointment.

## 2013-03-19 ENCOUNTER — Encounter: Payer: Self-pay | Admitting: Internal Medicine

## 2013-03-19 ENCOUNTER — Encounter: Payer: Medicare Other | Admitting: Internal Medicine

## 2013-03-19 ENCOUNTER — Ambulatory Visit (INDEPENDENT_AMBULATORY_CARE_PROVIDER_SITE_OTHER): Payer: Medicare Other | Admitting: Internal Medicine

## 2013-03-19 VITALS — BP 130/70 | HR 64 | Temp 98.5°F | Wt 201.0 lb

## 2013-03-19 DIAGNOSIS — R079 Chest pain, unspecified: Secondary | ICD-10-CM | POA: Diagnosis not present

## 2013-03-19 DIAGNOSIS — N390 Urinary tract infection, site not specified: Secondary | ICD-10-CM | POA: Diagnosis not present

## 2013-03-19 LAB — POCT URINALYSIS DIPSTICK
Bilirubin, UA: NEGATIVE
Blood, UA: NEGATIVE
Ketones, UA: NEGATIVE
Protein, UA: NEGATIVE
Spec Grav, UA: 1.02
Urobilinogen, UA: 0.2
pH, UA: 7

## 2013-03-19 MED ORDER — NITROGLYCERIN 0.3 MG SL SUBL: 0.3000 mg | SUBLINGUAL_TABLET | SUBLINGUAL | Status: DC | PRN

## 2013-03-19 NOTE — Progress Notes (Signed)
Subjective:    Patient ID: Cindy Graham, female    DOB: 12/01/1921, 79 y.o.   MRN: [id number]  HPI 79YO female with HTN, OA presents for follow up after recent hospitalization with chest pain and UTI. Last Wednesday went to [facility],  because of hip and leg pain. Started on Tramadol. Thursday, saw her pain specialist, Dr. Yves Dill, for pain, and he started pt on Prednisone. She became nauseous and vomited at home after starting medication, and developed substernal chest pain. Son, Dannielle Huh, took her to [facility] for evaluation.  BP was 180/85 to 215/85. EKG showed non-specific changes. Cardiac markers were negative. She was diagnosed with allergic reaction to Tramadol and UTI. Treated with Cipro. Discharged Friday.   This morning, had some substernal dull aching, that radiated around chest. Improved with 1 NTG SL. No current chest pain. No recent dyspnea at rest, however becomes short of breath with minimal exertion. Her son reports this has been present for several months. She has been more sedentary recently because of increased fatigue and DOE. No LEE noted. No urinary symptoms at home. No fever, chills.  Outpatient Encounter Prescriptions as of 03/19/2013  Medication Sig Dispense Refill  . aspirin 81 MG tablet Take 81 mg by mouth daily.        Marland Kitchen atenolol (TENORMIN) 25 MG tablet TAKE ONE (1) TABLET BY MOUTH EVERY      DAY  90 tablet  0  . Biotin 1000 MCG tablet Take 1,000 mcg by mouth daily.        . cholecalciferol (VITAMIN D) 400 UNITS TABS Take 400 Units by mouth 2 (two) times daily.        . ciprofloxacin (CIPRO) 500 MG tablet       . enalapril (VASOTEC) 20 MG tablet Take 1 tablet by mouth Twice daily.      Marland Kitchen estrogens, conjugated, (PREMARIN) 0.3 MG tablet Take 1 tablet (0.3 mg total) by mouth daily.  30 tablet  6  . Flaxseed, Linseed, (FLAX SEED OIL) 1000 MG CAPS Take 1 capsule by mouth daily.        . fluticasone (FLOVENT HFA) 110 MCG/ACT inhaler Inhale 2 puffs into the lungs  daily.  1 Inhaler  5  . gabapentin (NEURONTIN) 300 MG capsule TAKE ONE (1) CAPSULE EACH DAY  90 capsule  0  . lidocaine (LIDODERM) 5 % Place 1 patch onto the skin every 12 (twelve) hours. Remove & Discard patch within 12 hours or as directed by MD  30 patch  3  . Multiple Vitamin (MULTIVITAMIN) capsule Take 1 capsule by mouth daily.        . nitroGLYCERIN (NITROSTAT) 0.3 MG SL tablet Place 1 tablet (0.3 mg total) under the tongue every 5 (five) minutes as needed.  90 tablet  0  . nystatin (MYCOSTATIN/NYSTOP) 100000 UNIT/GM POWD Apply 1 application topically BID times 48H.  30 g  1  . omeprazole (PRILOSEC) 20 MG capsule Take 1 capsule (20 mg total) by mouth daily.  90 capsule  1  . oxybutynin (DITROPAN) 5 MG tablet Take 1 tablet (5 mg total) by mouth daily.  30 tablet  5  . predniSONE (DELTASONE) 5 MG tablet       . sertraline (ZOLOFT) 50 MG tablet Take 2 tablets (100 mg total) by mouth daily.  60 tablet  2  . triamterene-hydrochlorothiazide (MAXZIDE) 75-50 MG per tablet TAKE ONE (1) TABLET EACH DAY  90 tablet  0  . verapamil (CALAN-SR) 180 MG  CR tablet Take 1 tablet (180 mg total) by mouth at bedtime.  90 tablet  4  . [DISCONTINUED] nitroGLYCERIN (NITROSTAT) 0.3 MG SL tablet Place 0.3 mg under the tongue every 5 (five) minutes as needed.         No facility-administered encounter medications on file as of 03/19/2013.   BP 130/70  Pulse 64  Temp(Src) 98.5 F (36.9 C) (Oral)  Wt 201 lb (91.173 kg)  BMI 35.04 kg/m2  SpO2 96%  Review of Systems  Constitutional: Negative for fever, chills, appetite change, fatigue and unexpected weight change.  HENT: Negative for congestion, ear pain, sinus pressure, sore throat, trouble swallowing and voice change.   Eyes: Negative for visual disturbance.  Respiratory: Positive for shortness of breath. Negative for cough, wheezing and stridor.   Cardiovascular: Positive for chest pain. Negative for palpitations and leg swelling.  Gastrointestinal: Negative  for nausea, vomiting, abdominal pain, diarrhea, constipation, blood in stool, abdominal distention and anal bleeding.  Genitourinary: Negative for dysuria and flank pain.  Musculoskeletal: Negative for arthralgias, gait problem, myalgias and neck pain.  Skin: Negative for color change and rash.  Neurological: Negative for dizziness and headaches.  Hematological: Negative for adenopathy. Does not bruise/bleed easily.  Psychiatric/Behavioral: Negative for suicidal ideas, sleep disturbance and dysphoric mood. The patient is not nervous/anxious.        Objective:   Physical Exam  Constitutional: She is oriented to person, place, and time. She appears well-developed and well-nourished. No distress.  HENT:  Head: Normocephalic and atraumatic.  Right Ear: External ear normal.  Left Ear: External ear normal.  Nose: Nose normal.  Mouth/Throat: Oropharynx is clear and moist. No oropharyngeal exudate.  Eyes: Conjunctivae are normal. Pupils are equal, round, and reactive to light. Right eye exhibits no discharge. Left eye exhibits no discharge. No scleral icterus.  Neck: Normal range of motion. Neck supple. No tracheal deviation present. No thyromegaly present.  Cardiovascular: Normal rate, regular rhythm, normal heart sounds and intact distal pulses.  Exam reveals no gallop and no friction rub.   No murmur heard. Pulmonary/Chest: Effort normal. No accessory muscle usage. Not tachypneic. No respiratory distress. She has no decreased breath sounds. She has no wheezes. She has no rhonchi. She has rales (few scattered). She exhibits no tenderness.  Musculoskeletal: Normal range of motion. She exhibits no edema and no tenderness.  Lymphadenopathy:    She has no cervical adenopathy.  Neurological: She is alert and oriented to person, place, and time. No cranial nerve deficit. She exhibits normal muscle tone. Coordination normal.  Skin: Skin is warm and dry. No rash noted. She is not diaphoretic. No  erythema. No pallor.  Psychiatric: She has a normal mood and affect. Her behavior is normal. Judgment and thought content normal.          Assessment & Plan:

## 2013-03-19 NOTE — Assessment & Plan Note (Signed)
Intermittent substernal chest pain, concerning for CAD. Recent cardiac markers normal during hospitalization at [facility]. Will set up cardiology evaluation with Dr. Kirke Corin. Question if stress test or cardiac cath might be helpful for further evaluation. Will continue prn nitroglycerin. Continue Atenolol, Enalapril, Aspirin. Pt unable to tolerate statin medications. Follow up here 2 weeks.

## 2013-03-19 NOTE — Assessment & Plan Note (Signed)
Recent hospitalization with UTI. Asymptomatic. Will recheck UA and culture today. Continue Cipro until results back.

## 2013-03-20 ENCOUNTER — Telehealth: Payer: Self-pay

## 2013-03-20 DIAGNOSIS — Z23 Encounter for immunization: Secondary | ICD-10-CM | POA: Diagnosis not present

## 2013-03-20 LAB — URINE CULTURE: Colony Count: NO GROWTH

## 2013-03-20 NOTE — Telephone Encounter (Signed)
Patient contacted regarding discharge from [facility] on 03/15/13.  Patient understands to follow up with provider Dr. Kirke Corin on 03/22/13 at 11:15 at [facility]. Patient understands discharge instructions? yes Patient understands medications and regiment? yes Patient understands to bring all medications to this visit? yes

## 2013-03-21 DIAGNOSIS — I10 Essential (primary) hypertension: Secondary | ICD-10-CM | POA: Diagnosis not present

## 2013-03-21 DIAGNOSIS — K219 Gastro-esophageal reflux disease without esophagitis: Secondary | ICD-10-CM | POA: Diagnosis not present

## 2013-03-21 DIAGNOSIS — N390 Urinary tract infection, site not specified: Secondary | ICD-10-CM | POA: Diagnosis not present

## 2013-03-21 DIAGNOSIS — J45909 Unspecified asthma, uncomplicated: Secondary | ICD-10-CM | POA: Diagnosis not present

## 2013-03-21 DIAGNOSIS — M159 Polyosteoarthritis, unspecified: Secondary | ICD-10-CM | POA: Diagnosis not present

## 2013-03-21 DIAGNOSIS — I509 Heart failure, unspecified: Secondary | ICD-10-CM | POA: Diagnosis not present

## 2013-03-22 ENCOUNTER — Telehealth: Payer: Self-pay | Admitting: *Deleted

## 2013-03-22 ENCOUNTER — Encounter: Payer: Self-pay | Admitting: Cardiovascular Disease

## 2013-03-22 ENCOUNTER — Ambulatory Visit (INDEPENDENT_AMBULATORY_CARE_PROVIDER_SITE_OTHER): Payer: Medicaid Other | Admitting: Cardiovascular Disease

## 2013-03-22 ENCOUNTER — Encounter: Payer: Medicare Other | Admitting: Cardiovascular Disease

## 2013-03-22 VITALS — BP 162/71 | HR 73 | Ht 63.5 in | Wt 198.8 lb

## 2013-03-22 DIAGNOSIS — R079 Chest pain, unspecified: Secondary | ICD-10-CM

## 2013-03-22 DIAGNOSIS — I509 Heart failure, unspecified: Secondary | ICD-10-CM | POA: Diagnosis not present

## 2013-03-22 DIAGNOSIS — J45909 Unspecified asthma, uncomplicated: Secondary | ICD-10-CM | POA: Diagnosis not present

## 2013-03-22 DIAGNOSIS — K219 Gastro-esophageal reflux disease without esophagitis: Secondary | ICD-10-CM | POA: Diagnosis not present

## 2013-03-22 DIAGNOSIS — N390 Urinary tract infection, site not specified: Secondary | ICD-10-CM | POA: Diagnosis not present

## 2013-03-22 DIAGNOSIS — I10 Essential (primary) hypertension: Secondary | ICD-10-CM

## 2013-03-22 DIAGNOSIS — M159 Polyosteoarthritis, unspecified: Secondary | ICD-10-CM | POA: Diagnosis not present

## 2013-03-22 NOTE — Progress Notes (Signed)
HPI  This is an 79 year old female who is here today for a followup visit after recent hospitalization at [facility]. She was seen recently in the emergency room for back pain and was discharged on tramadol. It appears that she had GI side effects with tramadol. She became nauseous and had some chest discomfort. She was taken to the emergency room at [facility] and was found to have a systolic blood pressure of greater than 200 with nonspecific T wave changes. She ruled out for myocardial infarction. She was found to have a urinary tract infection. Echocardiogram showed normal LV systolic function with no significant wall motion abnormalities. I discussed ischemic cardiac evaluation with the patient's and her family and they opted for no further ischemic workup. Since hospital discharge, she has not had any further chest discomfort. She is feeling back to her normal baseline.  Allergies  Allergen Reactions  . Statins Other (See Comments)    Very bad muscle aches  . Tramadol Itching  . Neosporin [Neomycin-Bacitracin Zn-Polymyx]   . Penicillins Cross Reactors   . Sulfa Antibiotics   . Tetracyclines & Related      Current Outpatient Prescriptions on File Prior to Visit  Medication Sig Dispense Refill  . aspirin 81 MG tablet Take 81 mg by mouth daily.        Marland Kitchen atenolol (TENORMIN) 25 MG tablet TAKE ONE (1) TABLET BY MOUTH EVERY      DAY  90 tablet  0  . Biotin 1000 MCG tablet Take 1,000 mcg by mouth daily.        . cholecalciferol (VITAMIN D) 400 UNITS TABS Take 400 Units by mouth 2 (two) times daily.        . ciprofloxacin (CIPRO) 500 MG tablet Take 500 mg by mouth 2 (two) times daily.       . enalapril (VASOTEC) 20 MG tablet Take 1 tablet by mouth Twice daily.      Marland Kitchen estrogens, conjugated, (PREMARIN) 0.3 MG tablet Take 1 tablet (0.3 mg total) by mouth daily.  30 tablet  6  . Flaxseed, Linseed, (FLAX SEED OIL) 1000 MG CAPS Take 1 capsule by mouth daily.        . fluticasone (FLOVENT HFA) 110 MCG/ACT  inhaler Inhale 2 puffs into the lungs daily.  1 Inhaler  5  . gabapentin (NEURONTIN) 300 MG capsule TAKE ONE (1) CAPSULE EACH DAY  90 capsule  0  . lidocaine (LIDODERM) 5 % Place 1 patch onto the skin every 12 (twelve) hours. Remove & Discard patch within 12 hours or as directed by MD  30 patch  3  . Multiple Vitamin (MULTIVITAMIN) capsule Take 1 capsule by mouth daily.        . nitroGLYCERIN (NITROSTAT) 0.3 MG SL tablet Place 1 tablet (0.3 mg total) under the tongue every 5 (five) minutes as needed.  90 tablet  0  . nystatin (MYCOSTATIN/NYSTOP) 100000 UNIT/GM POWD Apply 1 application topically BID times 48H.  30 g  1  . omeprazole (PRILOSEC) 20 MG capsule Take 1 capsule (20 mg total) by mouth daily.  90 capsule  1  . oxybutynin (DITROPAN) 5 MG tablet Take 1 tablet (5 mg total) by mouth daily.  30 tablet  5  . predniSONE (DELTASONE) 5 MG tablet Taper.      . sertraline (ZOLOFT) 50 MG tablet Take 2 tablets (100 mg total) by mouth daily.  60 tablet  2  . triamterene-hydrochlorothiazide (MAXZIDE) 75-50 MG per tablet TAKE ONE (1) TABLET  EACH DAY  90 tablet  0  . verapamil (CALAN-SR) 180 MG CR tablet Take 1 tablet (180 mg total) by mouth at bedtime.  90 tablet  4  . [DISCONTINUED] oxybutynin (DITROPAN) 5 MG tablet Take 1 tablet by mouth Daily.       No current facility-administered medications on file prior to visit.     Past Medical History  Diagnosis Date  . Hypertension   . Hyperlipidemia   . Hernia   . Urine incontinence   . COPD (chronic obstructive pulmonary disease)   . Arthritis     osteo  . GERD (gastroesophageal reflux disease)   . Heart murmur   . Syncope and collapse   . Melanoma     face     Past Surgical History  Procedure Laterality Date  . Abdominal hysterectomy    . Cholecystectomy    . Hernia repair       Family History  Problem Relation Age of Onset  . Stroke Father   . Heart disease Sister   . Cancer Brother     colon  . Heart disease Brother       History   Social History  . Marital Status: Married    Spouse Name: N/A    Number of Children: N/A  . Years of Education: N/A   Occupational History  . Not on file.   Social History Main Topics  . Smoking status: Never Smoker   . Smokeless tobacco: Never Used  . Alcohol Use: No  . Drug Use: No  . Sexual Activity: Not on file   Other Topics Concern  . Not on file   Social History Narrative  . No narrative on file      PHYSICAL EXAM   BP 162/71  Pulse 73  Ht 5' 3.5" (1.613 m)  Wt 198 lb 12 oz (90.152 kg)  BMI 34.65 kg/m2 Constitutional: She is oriented to person, place, and time. She appears well-developed and well-nourished. No distress.  HENT: No nasal discharge.  Head: Normocephalic and atraumatic.  Eyes: Pupils are equal and round. No discharge.  Neck: Normal range of motion. Neck supple. No JVD present. No thyromegaly present.  Cardiovascular: Normal rate, regular rhythm, normal heart sounds. Exam reveals no gallop and no friction rub. No murmur heard.  Pulmonary/Chest: Effort normal and breath sounds normal. No stridor. No respiratory distress. She has no wheezes. She has no rales. She exhibits no tenderness.  Abdominal: Soft. Bowel sounds are normal. She exhibits no distension. There is no tenderness. There is no rebound and no guarding.  Musculoskeletal: Normal range of motion. She exhibits no edema and no tenderness.  Neurological: She is alert and oriented to person, place, and time. Coordination normal.  Skin: Skin is warm and dry. No rash noted. She is not diaphoretic. No erythema. No pallor.  Psychiatric: She has a normal mood and affect. Her behavior is normal. Judgment and thought content normal.     EKG: Recent EKG with Dr. Dan Humphreys was reviewed which showed sinus rhythm with nonspecific anterior T wave changes  ASSESSMENT AND PLAN

## 2013-03-22 NOTE — Telephone Encounter (Signed)
Bonita Quin from [facility] called stating they would like a verbal ok for patient to have OT and a Child psychotherapist as well as orders for incontinence supplies. Verbal ok called and given to [facility].

## 2013-03-22 NOTE — Patient Instructions (Signed)
Continue same medications.   Follow up as needed.  

## 2013-03-22 NOTE — Telephone Encounter (Signed)
That is fine 

## 2013-03-25 DIAGNOSIS — I509 Heart failure, unspecified: Secondary | ICD-10-CM | POA: Diagnosis not present

## 2013-03-25 DIAGNOSIS — M159 Polyosteoarthritis, unspecified: Secondary | ICD-10-CM | POA: Diagnosis not present

## 2013-03-25 DIAGNOSIS — K219 Gastro-esophageal reflux disease without esophagitis: Secondary | ICD-10-CM | POA: Diagnosis not present

## 2013-03-25 DIAGNOSIS — J45909 Unspecified asthma, uncomplicated: Secondary | ICD-10-CM | POA: Diagnosis not present

## 2013-03-25 DIAGNOSIS — I10 Essential (primary) hypertension: Secondary | ICD-10-CM | POA: Diagnosis not present

## 2013-03-25 DIAGNOSIS — N390 Urinary tract infection, site not specified: Secondary | ICD-10-CM | POA: Diagnosis not present

## 2013-03-26 DIAGNOSIS — I10 Essential (primary) hypertension: Secondary | ICD-10-CM | POA: Diagnosis not present

## 2013-03-26 DIAGNOSIS — K219 Gastro-esophageal reflux disease without esophagitis: Secondary | ICD-10-CM | POA: Diagnosis not present

## 2013-03-26 DIAGNOSIS — M159 Polyosteoarthritis, unspecified: Secondary | ICD-10-CM | POA: Diagnosis not present

## 2013-03-26 DIAGNOSIS — I509 Heart failure, unspecified: Secondary | ICD-10-CM | POA: Diagnosis not present

## 2013-03-26 DIAGNOSIS — N390 Urinary tract infection, site not specified: Secondary | ICD-10-CM | POA: Diagnosis not present

## 2013-03-26 DIAGNOSIS — J45909 Unspecified asthma, uncomplicated: Secondary | ICD-10-CM | POA: Diagnosis not present

## 2013-03-27 DIAGNOSIS — I10 Essential (primary) hypertension: Secondary | ICD-10-CM | POA: Diagnosis not present

## 2013-03-27 DIAGNOSIS — N390 Urinary tract infection, site not specified: Secondary | ICD-10-CM | POA: Diagnosis not present

## 2013-03-27 DIAGNOSIS — J45909 Unspecified asthma, uncomplicated: Secondary | ICD-10-CM | POA: Diagnosis not present

## 2013-03-27 DIAGNOSIS — K219 Gastro-esophageal reflux disease without esophagitis: Secondary | ICD-10-CM | POA: Diagnosis not present

## 2013-03-27 DIAGNOSIS — M159 Polyosteoarthritis, unspecified: Secondary | ICD-10-CM | POA: Diagnosis not present

## 2013-03-27 DIAGNOSIS — I509 Heart failure, unspecified: Secondary | ICD-10-CM | POA: Diagnosis not present

## 2013-03-28 ENCOUNTER — Encounter: Payer: Medicare Other | Admitting: Cardiovascular Disease

## 2013-03-28 ENCOUNTER — Encounter: Payer: Self-pay | Admitting: Cardiovascular Disease

## 2013-03-28 DIAGNOSIS — J45909 Unspecified asthma, uncomplicated: Secondary | ICD-10-CM | POA: Diagnosis not present

## 2013-03-28 DIAGNOSIS — I10 Essential (primary) hypertension: Secondary | ICD-10-CM | POA: Diagnosis not present

## 2013-03-28 DIAGNOSIS — K219 Gastro-esophageal reflux disease without esophagitis: Secondary | ICD-10-CM | POA: Diagnosis not present

## 2013-03-28 DIAGNOSIS — N390 Urinary tract infection, site not specified: Secondary | ICD-10-CM | POA: Diagnosis not present

## 2013-03-28 DIAGNOSIS — M159 Polyosteoarthritis, unspecified: Secondary | ICD-10-CM | POA: Diagnosis not present

## 2013-03-28 DIAGNOSIS — I509 Heart failure, unspecified: Secondary | ICD-10-CM | POA: Diagnosis not present

## 2013-03-28 NOTE — Assessment & Plan Note (Signed)
I suspect that this was likely due to elevated blood pressure supply demand ischemia. Obviously, with her age she is at risk for underlying ischemic heart disease. Due to her age and comorbidities, she prefers conservative management of this at this point. If she develops any ischemic symptoms, we can consider proceeding with stress testing. I think that a lot of her symptoms recently were due to UTI and side effects of tramadol.

## 2013-03-29 DIAGNOSIS — I509 Heart failure, unspecified: Secondary | ICD-10-CM | POA: Diagnosis not present

## 2013-03-29 DIAGNOSIS — J45909 Unspecified asthma, uncomplicated: Secondary | ICD-10-CM | POA: Diagnosis not present

## 2013-03-29 DIAGNOSIS — I10 Essential (primary) hypertension: Secondary | ICD-10-CM | POA: Diagnosis not present

## 2013-03-29 DIAGNOSIS — N390 Urinary tract infection, site not specified: Secondary | ICD-10-CM

## 2013-03-29 DIAGNOSIS — M159 Polyosteoarthritis, unspecified: Secondary | ICD-10-CM | POA: Diagnosis not present

## 2013-03-29 DIAGNOSIS — K219 Gastro-esophageal reflux disease without esophagitis: Secondary | ICD-10-CM | POA: Diagnosis not present

## 2013-04-01 DIAGNOSIS — I509 Heart failure, unspecified: Secondary | ICD-10-CM | POA: Diagnosis not present

## 2013-04-01 DIAGNOSIS — M159 Polyosteoarthritis, unspecified: Secondary | ICD-10-CM | POA: Diagnosis not present

## 2013-04-01 DIAGNOSIS — N390 Urinary tract infection, site not specified: Secondary | ICD-10-CM | POA: Diagnosis not present

## 2013-04-01 DIAGNOSIS — J45909 Unspecified asthma, uncomplicated: Secondary | ICD-10-CM | POA: Diagnosis not present

## 2013-04-01 DIAGNOSIS — I10 Essential (primary) hypertension: Secondary | ICD-10-CM | POA: Diagnosis not present

## 2013-04-01 DIAGNOSIS — K219 Gastro-esophageal reflux disease without esophagitis: Secondary | ICD-10-CM | POA: Diagnosis not present

## 2013-04-02 DIAGNOSIS — I509 Heart failure, unspecified: Secondary | ICD-10-CM | POA: Diagnosis not present

## 2013-04-02 DIAGNOSIS — M159 Polyosteoarthritis, unspecified: Secondary | ICD-10-CM | POA: Diagnosis not present

## 2013-04-02 DIAGNOSIS — N390 Urinary tract infection, site not specified: Secondary | ICD-10-CM | POA: Diagnosis not present

## 2013-04-02 DIAGNOSIS — K219 Gastro-esophageal reflux disease without esophagitis: Secondary | ICD-10-CM | POA: Diagnosis not present

## 2013-04-02 DIAGNOSIS — I10 Essential (primary) hypertension: Secondary | ICD-10-CM | POA: Diagnosis not present

## 2013-04-02 DIAGNOSIS — J45909 Unspecified asthma, uncomplicated: Secondary | ICD-10-CM | POA: Diagnosis not present

## 2013-04-03 DIAGNOSIS — IMO0002 Reserved for concepts with insufficient information to code with codable children: Secondary | ICD-10-CM | POA: Diagnosis not present

## 2013-04-03 DIAGNOSIS — M5126 Other intervertebral disc displacement, lumbar region: Secondary | ICD-10-CM | POA: Diagnosis not present

## 2013-04-04 DIAGNOSIS — J45909 Unspecified asthma, uncomplicated: Secondary | ICD-10-CM | POA: Diagnosis not present

## 2013-04-04 DIAGNOSIS — M159 Polyosteoarthritis, unspecified: Secondary | ICD-10-CM | POA: Diagnosis not present

## 2013-04-04 DIAGNOSIS — N390 Urinary tract infection, site not specified: Secondary | ICD-10-CM | POA: Diagnosis not present

## 2013-04-04 DIAGNOSIS — K219 Gastro-esophageal reflux disease without esophagitis: Secondary | ICD-10-CM | POA: Diagnosis not present

## 2013-04-04 DIAGNOSIS — I10 Essential (primary) hypertension: Secondary | ICD-10-CM | POA: Diagnosis not present

## 2013-04-04 DIAGNOSIS — I509 Heart failure, unspecified: Secondary | ICD-10-CM | POA: Diagnosis not present

## 2013-04-05 DIAGNOSIS — N390 Urinary tract infection, site not specified: Secondary | ICD-10-CM | POA: Diagnosis not present

## 2013-04-05 DIAGNOSIS — M159 Polyosteoarthritis, unspecified: Secondary | ICD-10-CM | POA: Diagnosis not present

## 2013-04-05 DIAGNOSIS — I10 Essential (primary) hypertension: Secondary | ICD-10-CM | POA: Diagnosis not present

## 2013-04-05 DIAGNOSIS — K219 Gastro-esophageal reflux disease without esophagitis: Secondary | ICD-10-CM | POA: Diagnosis not present

## 2013-04-05 DIAGNOSIS — I509 Heart failure, unspecified: Secondary | ICD-10-CM | POA: Diagnosis not present

## 2013-04-05 DIAGNOSIS — J45909 Unspecified asthma, uncomplicated: Secondary | ICD-10-CM | POA: Diagnosis not present

## 2013-04-08 ENCOUNTER — Telehealth: Payer: Self-pay | Admitting: Emergency Medicine

## 2013-04-08 DIAGNOSIS — M159 Polyosteoarthritis, unspecified: Secondary | ICD-10-CM | POA: Diagnosis not present

## 2013-04-08 DIAGNOSIS — I10 Essential (primary) hypertension: Secondary | ICD-10-CM | POA: Diagnosis not present

## 2013-04-08 DIAGNOSIS — J45909 Unspecified asthma, uncomplicated: Secondary | ICD-10-CM | POA: Diagnosis not present

## 2013-04-08 DIAGNOSIS — N390 Urinary tract infection, site not specified: Secondary | ICD-10-CM | POA: Diagnosis not present

## 2013-04-08 DIAGNOSIS — I509 Heart failure, unspecified: Secondary | ICD-10-CM | POA: Diagnosis not present

## 2013-04-08 DIAGNOSIS — K219 Gastro-esophageal reflux disease without esophagitis: Secondary | ICD-10-CM | POA: Diagnosis not present

## 2013-04-08 NOTE — Telephone Encounter (Signed)
She will need to be seen

## 2013-04-08 NOTE — Telephone Encounter (Signed)
Spoke with patient she state Tylenol is not helping her at all and if you are going to call her in something then please call it in to [facility] pharmacy

## 2013-04-08 NOTE — Telephone Encounter (Signed)
Please read below and advise.

## 2013-04-08 NOTE — Telephone Encounter (Signed)
Is it ok for the patient to take Aleve instead of extra strength tylenol. The aleve helps her back pain the tylenol does not help her back pain.

## 2013-04-08 NOTE — Telephone Encounter (Signed)
She has kidney dysfunction, so should avoid Aleve. If Tylenol is not helpful, we will need to try alternative prescription medication.

## 2013-04-09 DIAGNOSIS — K219 Gastro-esophageal reflux disease without esophagitis: Secondary | ICD-10-CM | POA: Diagnosis not present

## 2013-04-09 DIAGNOSIS — N390 Urinary tract infection, site not specified: Secondary | ICD-10-CM | POA: Diagnosis not present

## 2013-04-09 DIAGNOSIS — I10 Essential (primary) hypertension: Secondary | ICD-10-CM | POA: Diagnosis not present

## 2013-04-09 DIAGNOSIS — J45909 Unspecified asthma, uncomplicated: Secondary | ICD-10-CM | POA: Diagnosis not present

## 2013-04-09 DIAGNOSIS — I509 Heart failure, unspecified: Secondary | ICD-10-CM | POA: Diagnosis not present

## 2013-04-09 DIAGNOSIS — M159 Polyosteoarthritis, unspecified: Secondary | ICD-10-CM | POA: Diagnosis not present

## 2013-04-09 NOTE — Telephone Encounter (Signed)
Spoke with patient, she state she will call Dr. Marnee Spring office to see what he can do for her.

## 2013-04-10 DIAGNOSIS — I509 Heart failure, unspecified: Secondary | ICD-10-CM | POA: Diagnosis not present

## 2013-04-10 DIAGNOSIS — K219 Gastro-esophageal reflux disease without esophagitis: Secondary | ICD-10-CM | POA: Diagnosis not present

## 2013-04-10 DIAGNOSIS — M159 Polyosteoarthritis, unspecified: Secondary | ICD-10-CM | POA: Diagnosis not present

## 2013-04-10 DIAGNOSIS — I10 Essential (primary) hypertension: Secondary | ICD-10-CM | POA: Diagnosis not present

## 2013-04-10 DIAGNOSIS — J45909 Unspecified asthma, uncomplicated: Secondary | ICD-10-CM | POA: Diagnosis not present

## 2013-04-10 DIAGNOSIS — N390 Urinary tract infection, site not specified: Secondary | ICD-10-CM | POA: Diagnosis not present

## 2013-04-11 DIAGNOSIS — K219 Gastro-esophageal reflux disease without esophagitis: Secondary | ICD-10-CM | POA: Diagnosis not present

## 2013-04-11 DIAGNOSIS — M159 Polyosteoarthritis, unspecified: Secondary | ICD-10-CM | POA: Diagnosis not present

## 2013-04-11 DIAGNOSIS — N390 Urinary tract infection, site not specified: Secondary | ICD-10-CM | POA: Diagnosis not present

## 2013-04-11 DIAGNOSIS — J45909 Unspecified asthma, uncomplicated: Secondary | ICD-10-CM | POA: Diagnosis not present

## 2013-04-11 DIAGNOSIS — I509 Heart failure, unspecified: Secondary | ICD-10-CM | POA: Diagnosis not present

## 2013-04-11 DIAGNOSIS — I10 Essential (primary) hypertension: Secondary | ICD-10-CM | POA: Diagnosis not present

## 2013-04-12 DIAGNOSIS — N390 Urinary tract infection, site not specified: Secondary | ICD-10-CM | POA: Diagnosis not present

## 2013-04-12 DIAGNOSIS — M159 Polyosteoarthritis, unspecified: Secondary | ICD-10-CM | POA: Diagnosis not present

## 2013-04-12 DIAGNOSIS — I509 Heart failure, unspecified: Secondary | ICD-10-CM | POA: Diagnosis not present

## 2013-04-12 DIAGNOSIS — I10 Essential (primary) hypertension: Secondary | ICD-10-CM | POA: Diagnosis not present

## 2013-04-12 DIAGNOSIS — K219 Gastro-esophageal reflux disease without esophagitis: Secondary | ICD-10-CM | POA: Diagnosis not present

## 2013-04-12 DIAGNOSIS — J45909 Unspecified asthma, uncomplicated: Secondary | ICD-10-CM | POA: Diagnosis not present

## 2013-04-16 DIAGNOSIS — J45909 Unspecified asthma, uncomplicated: Secondary | ICD-10-CM | POA: Diagnosis not present

## 2013-04-16 DIAGNOSIS — M159 Polyosteoarthritis, unspecified: Secondary | ICD-10-CM | POA: Diagnosis not present

## 2013-04-16 DIAGNOSIS — K219 Gastro-esophageal reflux disease without esophagitis: Secondary | ICD-10-CM | POA: Diagnosis not present

## 2013-04-16 DIAGNOSIS — N390 Urinary tract infection, site not specified: Secondary | ICD-10-CM | POA: Diagnosis not present

## 2013-04-16 DIAGNOSIS — I509 Heart failure, unspecified: Secondary | ICD-10-CM | POA: Diagnosis not present

## 2013-04-16 DIAGNOSIS — I10 Essential (primary) hypertension: Secondary | ICD-10-CM | POA: Diagnosis not present

## 2013-04-20 DIAGNOSIS — N390 Urinary tract infection, site not specified: Secondary | ICD-10-CM | POA: Diagnosis not present

## 2013-04-20 DIAGNOSIS — M159 Polyosteoarthritis, unspecified: Secondary | ICD-10-CM | POA: Diagnosis not present

## 2013-04-20 DIAGNOSIS — J45909 Unspecified asthma, uncomplicated: Secondary | ICD-10-CM | POA: Diagnosis not present

## 2013-04-20 DIAGNOSIS — I509 Heart failure, unspecified: Secondary | ICD-10-CM | POA: Diagnosis not present

## 2013-04-20 DIAGNOSIS — I10 Essential (primary) hypertension: Secondary | ICD-10-CM | POA: Diagnosis not present

## 2013-04-20 DIAGNOSIS — K219 Gastro-esophageal reflux disease without esophagitis: Secondary | ICD-10-CM | POA: Diagnosis not present

## 2013-04-22 ENCOUNTER — Encounter: Payer: Self-pay | Admitting: *Deleted

## 2013-04-22 ENCOUNTER — Other Ambulatory Visit: Payer: Self-pay | Admitting: *Deleted

## 2013-04-22 ENCOUNTER — Telehealth: Payer: Self-pay | Admitting: Internal Medicine

## 2013-04-22 DIAGNOSIS — M5432 Sciatica, left side: Secondary | ICD-10-CM

## 2013-04-22 MED ORDER — OXYBUTYNIN CHLORIDE 5 MG PO TABS: 5.0000 mg | ORAL_TABLET | Freq: Every day | ORAL | Status: DC

## 2013-04-22 MED ORDER — OMEPRAZOLE 20 MG PO CPDR: 20.0000 mg | DELAYED_RELEASE_CAPSULE | Freq: Every day | ORAL | Status: DC

## 2013-04-22 MED ORDER — GABAPENTIN 300 MG PO CAPS: ORAL_CAPSULE | ORAL | Status: DC

## 2013-04-22 MED ORDER — ATENOLOL 25 MG PO TABS: ORAL_TABLET | ORAL | Status: DC

## 2013-04-22 MED ORDER — LIDOCAINE 5 % EX PTCH: 1.0000 | MEDICATED_PATCH | Freq: Two times a day (BID) | CUTANEOUS | Status: DC

## 2013-04-22 MED ORDER — ESTROGENS CONJUGATED 0.3 MG PO TABS: 0.3000 mg | ORAL_TABLET | Freq: Every day | ORAL | Status: DC

## 2013-04-22 NOTE — Telephone Encounter (Signed)
Ok to refill? Appt tomorrow

## 2013-04-22 NOTE — Telephone Encounter (Signed)
Patient called in after calling her pharmacy she is completely out of her medication and has no more refills please send in and let patient know once this has been done: oxybutynin 5 mg, triamterene-hydrochlorothiazide 75-50mg , premarin 0.3mg , lidoderm 5%, she uses [facility] pharmacy.

## 2013-04-22 NOTE — Telephone Encounter (Signed)
Rx has been sent to the pharmacy and patient was called to be informed of this.

## 2013-04-23 ENCOUNTER — Encounter: Payer: Self-pay | Admitting: Internal Medicine

## 2013-04-23 ENCOUNTER — Ambulatory Visit (INDEPENDENT_AMBULATORY_CARE_PROVIDER_SITE_OTHER): Payer: Medicare Other | Admitting: Internal Medicine

## 2013-04-23 VITALS — BP 120/66 | HR 68 | Temp 98.1°F | Wt 203.0 lb

## 2013-04-23 DIAGNOSIS — M549 Dorsalgia, unspecified: Secondary | ICD-10-CM

## 2013-04-23 DIAGNOSIS — R079 Chest pain, unspecified: Secondary | ICD-10-CM

## 2013-04-23 DIAGNOSIS — M1990 Unspecified osteoarthritis, unspecified site: Secondary | ICD-10-CM | POA: Diagnosis not present

## 2013-04-23 DIAGNOSIS — G8929 Other chronic pain: Secondary | ICD-10-CM | POA: Diagnosis not present

## 2013-04-23 DIAGNOSIS — I10 Essential (primary) hypertension: Secondary | ICD-10-CM | POA: Diagnosis not present

## 2013-04-23 DIAGNOSIS — M5430 Sciatica, unspecified side: Secondary | ICD-10-CM | POA: Diagnosis not present

## 2013-04-23 DIAGNOSIS — M5432 Sciatica, left side: Secondary | ICD-10-CM

## 2013-04-23 MED ORDER — TRIAMTERENE-HCTZ 75-50 MG PO TABS: ORAL_TABLET | ORAL | Status: DC

## 2013-04-23 MED ORDER — LIDOCAINE 5 % EX PTCH: 1.0000 | MEDICATED_PATCH | Freq: Two times a day (BID) | CUTANEOUS | Status: DC

## 2013-04-23 NOTE — Progress Notes (Signed)
Subjective:    Patient ID: Cindy Graham, female    DOB: 08/30/1921, 79 y.o.   MRN: [id number]  HPI 79 year old female with history of chronic low back pain secondary to degenerative joint disease, hypertension, and recent episodes of chest pain presents for followup. She reports no further episodes of chest pain since her last evaluation. She is generally feeling well except for chronic low back pain. She reports minimal improvement with use of Neurontin and lidocaine patch. Her pain management Dr. recently started her on Nucynta. Minimal improvement with this. She has followup scheduled with him. She is hoping to have a repeat epidural steroid injection as she had improvement with this in the past.  Outpatient Encounter Prescriptions as of 04/23/2013  Medication Sig  . aspirin 81 MG tablet Take 81 mg by mouth daily.    Marland Kitchen atenolol (TENORMIN) 25 MG tablet TAKE ONE (1) TABLET BY MOUTH EVERY      DAY  . Biotin 1000 MCG tablet Take 1,000 mcg by mouth daily.    . cholecalciferol (VITAMIN D) 400 UNITS TABS Take 400 Units by mouth 2 (two) times daily.    . enalapril (VASOTEC) 20 MG tablet Take 1 tablet by mouth Twice daily.  Marland Kitchen estrogens, conjugated, (PREMARIN) 0.3 MG tablet Take 1 tablet (0.3 mg total) by mouth daily.  . Flaxseed, Linseed, (FLAX SEED OIL) 1000 MG CAPS Take 1 capsule by mouth daily.    . fluticasone (FLOVENT HFA) 110 MCG/ACT inhaler Inhale 2 puffs into the lungs daily.  Marland Kitchen gabapentin (NEURONTIN) 300 MG capsule TAKE ONE (1) CAPSULE EACH DAY  . lidocaine (LIDODERM) 5 % Place 1 patch onto the skin every 12 (twelve) hours. Remove & Discard patch within 12 hours or as directed by MD  . Multiple Vitamin (MULTIVITAMIN) capsule Take 1 capsule by mouth daily.    . nitroGLYCERIN (NITROSTAT) 0.3 MG SL tablet Place 1 tablet (0.3 mg total) under the tongue every 5 (five) minutes as needed.  . NUCYNTA 50 MG TABS tablet Take 50 mg by mouth 2 (two) times daily as needed (Take 1 tablet twice a day as  needed).   . nystatin (MYCOSTATIN/NYSTOP) 100000 UNIT/GM POWD Apply 1 application topically BID times 48H.  Marland Kitchen omeprazole (PRILOSEC) 20 MG capsule Take 1 capsule (20 mg total) by mouth daily.  Marland Kitchen oxybutynin (DITROPAN) 5 MG tablet Take 1 tablet (5 mg total) by mouth daily.  . predniSONE (DELTASONE) 5 MG tablet Taper.  . sertraline (ZOLOFT) 50 MG tablet Take 2 tablets (100 mg total) by mouth daily.  Marland Kitchen triamterene-hydrochlorothiazide (MAXZIDE) 75-50 MG per tablet TAKE ONE (1) TABLET EACH DAY  . verapamil (CALAN-SR) 180 MG CR tablet Take 1 tablet (180 mg total) by mouth at bedtime.   BP 120/66  Pulse 68  Temp(Src) 98.1 F (36.7 C) (Oral)  Wt 203 lb (92.08 kg)  SpO2 94%  Review of Systems  Constitutional: Negative for fever, chills, appetite change, fatigue and unexpected weight change.  HENT: Negative for congestion, ear pain, sinus pressure, sore throat, trouble swallowing and voice change.   Eyes: Negative for visual disturbance.  Respiratory: Negative for cough, shortness of breath, wheezing and stridor.   Cardiovascular: Negative for chest pain, palpitations and leg swelling.  Gastrointestinal: Negative for nausea, vomiting, abdominal pain, diarrhea, constipation, blood in stool, abdominal distention and anal bleeding.  Genitourinary: Negative for dysuria and flank pain.  Musculoskeletal: Positive for arthralgias, back pain and myalgias. Negative for gait problem and neck pain.  Skin: Negative for  color change and rash.  Neurological: Negative for dizziness and headaches.  Hematological: Negative for adenopathy. Does not bruise/bleed easily.  Psychiatric/Behavioral: Negative for suicidal ideas, sleep disturbance and dysphoric mood. The patient is not nervous/anxious.        Objective:   Physical Exam  Constitutional: She is oriented to person, place, and time. She appears well-developed and well-nourished. No distress.  HENT:  Head: Normocephalic and atraumatic.  Right Ear:  External ear normal.  Left Ear: External ear normal.  Nose: Nose normal.  Mouth/Throat: Oropharynx is clear and moist. No oropharyngeal exudate.  Eyes: Conjunctivae are normal. Pupils are equal, round, and reactive to light. Right eye exhibits no discharge. Left eye exhibits no discharge. No scleral icterus.  Neck: Normal range of motion. Neck supple. No tracheal deviation present. No thyromegaly present.  Cardiovascular: Normal rate, regular rhythm, normal heart sounds and intact distal pulses.  Exam reveals no gallop and no friction rub.   No murmur heard. Pulmonary/Chest: Effort normal and breath sounds normal. No accessory muscle usage. Not tachypneic. No respiratory distress. She has no decreased breath sounds. She has no wheezes. She has no rhonchi. She has no rales. She exhibits no tenderness.  Musculoskeletal: She exhibits no edema and no tenderness.       Lumbar back: She exhibits decreased range of motion and pain.  Lymphadenopathy:    She has no cervical adenopathy.  Neurological: She is alert and oriented to person, place, and time. No cranial nerve deficit. She exhibits normal muscle tone. Coordination normal.  Skin: Skin is warm and dry. No rash noted. She is not diaphoretic. No erythema. No pallor.  Psychiatric: She has a normal mood and affect. Her behavior is normal. Judgment and thought content normal.          Assessment & Plan:

## 2013-04-23 NOTE — Assessment & Plan Note (Signed)
BP Readings from Last 3 Encounters:  04/23/13 120/66  03/22/13 162/71  03/19/13 130/70   BP well controlled on current medications. Will continue.

## 2013-04-23 NOTE — Assessment & Plan Note (Signed)
No recurrent symptoms since last visit. Will continue to monitor.

## 2013-04-23 NOTE — Assessment & Plan Note (Signed)
Persistent symptoms of severe low back pain, poorly controlled with current medication. Patient has followup with Dr. Yves Dill to discuss possible repeat ESI in 2 weeks. Will follow.

## 2013-04-23 NOTE — Progress Notes (Signed)
Pre-visit discussion using our clinic review tool. No additional management support is needed unless otherwise documented below in the visit note.  

## 2013-04-30 ENCOUNTER — Telehealth: Payer: Self-pay

## 2013-04-30 DIAGNOSIS — J45909 Unspecified asthma, uncomplicated: Secondary | ICD-10-CM | POA: Diagnosis not present

## 2013-04-30 DIAGNOSIS — N390 Urinary tract infection, site not specified: Secondary | ICD-10-CM | POA: Diagnosis not present

## 2013-04-30 DIAGNOSIS — K219 Gastro-esophageal reflux disease without esophagitis: Secondary | ICD-10-CM | POA: Diagnosis not present

## 2013-04-30 DIAGNOSIS — M159 Polyosteoarthritis, unspecified: Secondary | ICD-10-CM | POA: Diagnosis not present

## 2013-04-30 DIAGNOSIS — I509 Heart failure, unspecified: Secondary | ICD-10-CM | POA: Diagnosis not present

## 2013-04-30 DIAGNOSIS — I10 Essential (primary) hypertension: Secondary | ICD-10-CM | POA: Diagnosis not present

## 2013-04-30 NOTE — Telephone Encounter (Signed)
Fwd to Dr. Walker, please read below and advise. 

## 2013-04-30 NOTE — Telephone Encounter (Signed)
Why don't we try to see her back in a visit this week so we can check her BP and assess.

## 2013-04-30 NOTE — Telephone Encounter (Signed)
Patient confirmed appointment time for Friday 12/12 at 8:45

## 2013-04-30 NOTE — Telephone Encounter (Signed)
Nurse from [facility] is calling and saying that pt's b/p is pretty low it's been around 102/56 in left arm sitting and standing, right arm 116/60 sitting and standing. Nurse also says that she has been gaining weight. She wanted to let you know.  Cordelia Pen is the nurse her Call back number is [phone].

## 2013-05-02 ENCOUNTER — Encounter: Payer: Self-pay | Admitting: *Deleted

## 2013-05-02 DIAGNOSIS — M48062 Spinal stenosis, lumbar region with neurogenic claudication: Secondary | ICD-10-CM | POA: Diagnosis not present

## 2013-05-02 DIAGNOSIS — IMO0002 Reserved for concepts with insufficient information to code with codable children: Secondary | ICD-10-CM | POA: Diagnosis not present

## 2013-05-02 DIAGNOSIS — M5126 Other intervertebral disc displacement, lumbar region: Secondary | ICD-10-CM | POA: Diagnosis not present

## 2013-05-03 ENCOUNTER — Encounter: Payer: Self-pay | Admitting: Internal Medicine

## 2013-05-03 ENCOUNTER — Ambulatory Visit (INDEPENDENT_AMBULATORY_CARE_PROVIDER_SITE_OTHER): Payer: Medicare Other | Admitting: Internal Medicine

## 2013-05-03 VITALS — BP 124/78 | HR 73 | Temp 98.1°F | Wt 201.0 lb

## 2013-05-03 DIAGNOSIS — G8929 Other chronic pain: Secondary | ICD-10-CM

## 2013-05-03 DIAGNOSIS — I10 Essential (primary) hypertension: Secondary | ICD-10-CM | POA: Diagnosis not present

## 2013-05-03 DIAGNOSIS — R5381 Other malaise: Secondary | ICD-10-CM | POA: Diagnosis not present

## 2013-05-03 DIAGNOSIS — M549 Dorsalgia, unspecified: Secondary | ICD-10-CM | POA: Diagnosis not present

## 2013-05-03 LAB — CBC WITH DIFFERENTIAL/PLATELET
Basophils Absolute: 0 10*3/uL (ref 0.0–0.1)
Basophils Relative: 0.6 % (ref 0.0–3.0)
Hemoglobin: 12.5 g/dL (ref 12.0–15.0)
Lymphocytes Relative: 25.6 % (ref 12.0–46.0)
MCHC: 33.3 g/dL (ref 30.0–36.0)
MCV: 96 fl (ref 78.0–100.0)
Monocytes Relative: 10.7 % (ref 3.0–12.0)
Neutro Abs: 3.9 10*3/uL (ref 1.4–7.7)
Neutrophils Relative %: 59.5 % (ref 43.0–77.0)
Platelets: 281 10*3/uL (ref 150.0–400.0)
RBC: 3.89 Mil/uL (ref 3.87–5.11)
RDW: 14.9 % — ABNORMAL HIGH (ref 11.5–14.6)

## 2013-05-03 LAB — COMPREHENSIVE METABOLIC PANEL
AST: 23 U/L (ref 0–37)
Albumin: 3.6 g/dL (ref 3.5–5.2)
Alkaline Phosphatase: 58 U/L (ref 39–117)
CO2: 25 mEq/L (ref 19–32)
Calcium: 9.4 mg/dL (ref 8.4–10.5)
GFR: 42.04 mL/min — ABNORMAL LOW (ref >60.00)
Glucose, Bld: 81 mg/dL (ref 70–99)
Potassium: 4.4 mEq/L (ref 3.5–5.1)
Sodium: 141 mEq/L (ref 135–145)
Total Protein: 7.2 g/dL (ref 6.0–8.3)

## 2013-05-03 LAB — VITAMIN B12: Vitamin B-12: >1500 pg/mL — ABNORMAL HIGH (ref 211–911)

## 2013-05-03 MED ORDER — TRIAMTERENE-HCTZ 37.5-25 MG PO CAPS: 1.0000 | ORAL_CAPSULE | Freq: Every day | ORAL | Status: DC

## 2013-05-03 NOTE — Assessment & Plan Note (Signed)
BP Readings from Last 3 Encounters:  05/03/13 124/78  04/23/13 120/66  03/22/13 162/71   BP has been lower since pt was started on Nucynta. Will decrease dose of Triamterene-HCTZ to 37.5/25mg . If persistently low BP <110/70, then would favor stopping this medication. Pt will monitor BP at home and call with readings. Follow up in clinic in 1 month.

## 2013-05-03 NOTE — Patient Instructions (Signed)
We have called in a lower dose of Maxide for you to take, 37.5mg /25mg . Please discontinue your previous dose and start this medication.  Please reduce Verapamil to once daily.  Monitor blood pressure at home and call with readings.  Follow up 2 weeks.

## 2013-05-03 NOTE — Progress Notes (Signed)
Pre visit review using our clinic review tool, if applicable. No additional management support is needed unless otherwise documented below in the visit note. 

## 2013-05-03 NOTE — Assessment & Plan Note (Signed)
Pt recently placed on Nucynta for chronic back pain. BP has been lower on this medication, addressed above. She is also more confused on this medication and having increased trouble with memory. Will request notes from pain management physician. Discussed with pt that this is difficult situation given that she is not a candidate for other interventions to help with back pain such as surgery or additional injections.

## 2013-05-03 NOTE — Progress Notes (Signed)
Subjective:    Patient ID: Cindy Graham, female    DOB: 12-08-1921, 79 y.o.   MRN: [id number]  HPI 79 year old female with history of hypertension and chronic back pain secondary to degenerative arthritis presents for followup. She reports that her pain management physician recently started her on Nucynta. She reports feeling more fatigued on this medication. However, pain has been better controlled. At times, she has felt lightheaded. Blood pressure at home has been running lower, typically near 110/60. She has been compliant with her blood pressure medications. She questions whether dosing needs to be reduced on her blood pressure medicines. She denies any chest pain, palpitations, headache.  Outpatient Encounter Prescriptions as of 05/03/2013  Medication Sig  . aspirin 81 MG tablet Take 81 mg by mouth daily.    Marland Kitchen atenolol (TENORMIN) 25 MG tablet TAKE ONE (1) TABLET BY MOUTH EVERY      DAY  . Biotin 1000 MCG tablet Take 1,000 mcg by mouth daily.    . cholecalciferol (VITAMIN D) 400 UNITS TABS Take 400 Units by mouth 2 (two) times daily.    . enalapril (VASOTEC) 20 MG tablet Take 1 tablet by mouth Twice daily.  Marland Kitchen estrogens, conjugated, (PREMARIN) 0.3 MG tablet Take 1 tablet (0.3 mg total) by mouth daily.  . Flaxseed, Linseed, (FLAX SEED OIL) 1000 MG CAPS Take 1 capsule by mouth daily.    . fluticasone (FLOVENT HFA) 110 MCG/ACT inhaler Inhale 2 puffs into the lungs daily.  Marland Kitchen gabapentin (NEURONTIN) 300 MG capsule TAKE ONE (1) CAPSULE EACH DAY  . lidocaine (LIDODERM) 5 % Place 1 patch onto the skin every 12 (twelve) hours. Remove & Discard patch within 12 hours or as directed by MD  . Multiple Vitamin (MULTIVITAMIN) capsule Take 1 capsule by mouth daily.    . nitroGLYCERIN (NITROSTAT) 0.3 MG SL tablet Place 1 tablet (0.3 mg total) under the tongue every 5 (five) minutes as needed.  . NUCYNTA 50 MG TABS tablet Take 50 mg by mouth 2 (two) times daily as needed (Take 1 tablet twice a day as  needed).   . nystatin (MYCOSTATIN/NYSTOP) 100000 UNIT/GM POWD Apply 1 application topically BID times 48H.  Marland Kitchen omeprazole (PRILOSEC) 20 MG capsule Take 1 capsule (20 mg total) by mouth daily.  Marland Kitchen oxybutynin (DITROPAN) 5 MG tablet Take 1 tablet (5 mg total) by mouth daily.  . predniSONE (DELTASONE) 5 MG tablet Taper.  . sertraline (ZOLOFT) 50 MG tablet Take 2 tablets (100 mg total) by mouth daily.  Marland Kitchen triamterene-hydrochlorothiazide (DYAZIDE) 37.5-25 MG per capsule Take 1 each (1 capsule total) by mouth daily.  . verapamil (CALAN-SR) 180 MG CR tablet Take 1 tablet (180 mg total) by mouth at bedtime.   BP 124/78  Pulse 73  Temp(Src) 98.1 F (36.7 C) (Oral)  Wt 201 lb (91.173 kg)  SpO2 95%  Review of Systems  Constitutional: Negative for fever, chills, appetite change, fatigue and unexpected weight change.  HENT: Negative for congestion, ear pain, sinus pressure, sore throat, trouble swallowing and voice change.   Eyes: Negative for visual disturbance.  Respiratory: Negative for cough, shortness of breath, wheezing and stridor.   Cardiovascular: Negative for chest pain, palpitations and leg swelling.  Gastrointestinal: Negative for nausea, vomiting, abdominal pain, diarrhea, constipation, blood in stool, abdominal distention and anal bleeding.  Genitourinary: Negative for dysuria and flank pain.  Musculoskeletal: Positive for arthralgias and myalgias. Negative for gait problem and neck pain.  Skin: Negative for color change and rash.  Neurological:  Negative for dizziness and headaches.  Hematological: Negative for adenopathy. Does not bruise/bleed easily.  Psychiatric/Behavioral: Negative for suicidal ideas, sleep disturbance and dysphoric mood. The patient is not nervous/anxious.        Objective:   Physical Exam  Constitutional: She is oriented to person, place, and time. She appears well-developed and well-nourished. No distress.  HENT:  Head: Normocephalic and atraumatic.  Right  Ear: External ear normal.  Left Ear: External ear normal.  Nose: Nose normal.  Mouth/Throat: Oropharynx is clear and moist. No oropharyngeal exudate.  Eyes: Conjunctivae are normal. Pupils are equal, round, and reactive to light. Right eye exhibits no discharge. Left eye exhibits no discharge. No scleral icterus.  Neck: Normal range of motion. Neck supple. No tracheal deviation present. No thyromegaly present.  Cardiovascular: Normal rate, regular rhythm, normal heart sounds and intact distal pulses.  Exam reveals no gallop and no friction rub.   No murmur heard. Pulmonary/Chest: Effort normal and breath sounds normal. No accessory muscle usage. Not tachypneic. No respiratory distress. She has no decreased breath sounds. She has no wheezes. She has no rhonchi. She has no rales. She exhibits no tenderness.  Musculoskeletal: She exhibits no edema and no tenderness.       Thoracic back: She exhibits decreased range of motion and pain.       Lumbar back: She exhibits decreased range of motion and pain.  Lymphadenopathy:    She has no cervical adenopathy.  Neurological: She is alert and oriented to person, place, and time. No cranial nerve deficit. She exhibits normal muscle tone. Coordination normal.  Skin: Skin is warm and dry. No rash noted. She is not diaphoretic. No erythema. No pallor.  Psychiatric: She has a normal mood and affect. Her behavior is normal. Judgment and thought content normal.          Assessment & Plan:

## 2013-05-06 DIAGNOSIS — M159 Polyosteoarthritis, unspecified: Secondary | ICD-10-CM | POA: Diagnosis not present

## 2013-05-06 DIAGNOSIS — I509 Heart failure, unspecified: Secondary | ICD-10-CM | POA: Diagnosis not present

## 2013-05-06 DIAGNOSIS — N390 Urinary tract infection, site not specified: Secondary | ICD-10-CM | POA: Diagnosis not present

## 2013-05-06 DIAGNOSIS — K219 Gastro-esophageal reflux disease without esophagitis: Secondary | ICD-10-CM | POA: Diagnosis not present

## 2013-05-06 DIAGNOSIS — I10 Essential (primary) hypertension: Secondary | ICD-10-CM | POA: Diagnosis not present

## 2013-05-06 DIAGNOSIS — J45909 Unspecified asthma, uncomplicated: Secondary | ICD-10-CM | POA: Diagnosis not present

## 2013-05-13 ENCOUNTER — Telehealth: Payer: Self-pay | Admitting: *Deleted

## 2013-05-13 DIAGNOSIS — I10 Essential (primary) hypertension: Secondary | ICD-10-CM | POA: Diagnosis not present

## 2013-05-13 DIAGNOSIS — K219 Gastro-esophageal reflux disease without esophagitis: Secondary | ICD-10-CM | POA: Diagnosis not present

## 2013-05-13 DIAGNOSIS — J45909 Unspecified asthma, uncomplicated: Secondary | ICD-10-CM | POA: Diagnosis not present

## 2013-05-13 DIAGNOSIS — I509 Heart failure, unspecified: Secondary | ICD-10-CM | POA: Diagnosis not present

## 2013-05-13 DIAGNOSIS — N390 Urinary tract infection, site not specified: Secondary | ICD-10-CM | POA: Diagnosis not present

## 2013-05-13 DIAGNOSIS — M159 Polyosteoarthritis, unspecified: Secondary | ICD-10-CM | POA: Diagnosis not present

## 2013-05-13 NOTE — Telephone Encounter (Signed)
Cindy Graham left a message on the voicemail stating her Triam/HCTZ was increased about a week ago. She is on telemonitoring blood pressure, she was calling to give an update and see if any changes need to be made. Her BP readings has been running as listed and they are all early morning 105/56 117/58 110/54 116/55 123/58 120/57  Also need verbal ok to continue skilled nursing for patient.

## 2013-05-13 NOTE — Telephone Encounter (Signed)
These readings look fine. Continue current medication. OK to continue skilled nursing.

## 2013-05-13 NOTE — Telephone Encounter (Signed)
Linda given instructions and verbal ok.

## 2013-05-17 DIAGNOSIS — I10 Essential (primary) hypertension: Secondary | ICD-10-CM | POA: Diagnosis not present

## 2013-05-17 DIAGNOSIS — J45909 Unspecified asthma, uncomplicated: Secondary | ICD-10-CM | POA: Diagnosis not present

## 2013-05-17 DIAGNOSIS — K219 Gastro-esophageal reflux disease without esophagitis: Secondary | ICD-10-CM | POA: Diagnosis not present

## 2013-05-17 DIAGNOSIS — Z9981 Dependence on supplemental oxygen: Secondary | ICD-10-CM | POA: Diagnosis not present

## 2013-05-17 DIAGNOSIS — M159 Polyosteoarthritis, unspecified: Secondary | ICD-10-CM | POA: Diagnosis not present

## 2013-05-17 DIAGNOSIS — I509 Heart failure, unspecified: Secondary | ICD-10-CM | POA: Diagnosis not present

## 2013-05-17 DIAGNOSIS — I959 Hypotension, unspecified: Secondary | ICD-10-CM | POA: Diagnosis not present

## 2013-05-20 ENCOUNTER — Ambulatory Visit: Payer: Medicare Other | Admitting: Internal Medicine

## 2013-05-21 ENCOUNTER — Telehealth: Payer: Self-pay | Admitting: *Deleted

## 2013-05-21 ENCOUNTER — Other Ambulatory Visit: Payer: Self-pay | Admitting: Internal Medicine

## 2013-05-21 DIAGNOSIS — J45909 Unspecified asthma, uncomplicated: Secondary | ICD-10-CM | POA: Diagnosis not present

## 2013-05-21 DIAGNOSIS — M159 Polyosteoarthritis, unspecified: Secondary | ICD-10-CM | POA: Diagnosis not present

## 2013-05-21 DIAGNOSIS — K219 Gastro-esophageal reflux disease without esophagitis: Secondary | ICD-10-CM | POA: Diagnosis not present

## 2013-05-21 DIAGNOSIS — I509 Heart failure, unspecified: Secondary | ICD-10-CM | POA: Diagnosis not present

## 2013-05-21 DIAGNOSIS — I10 Essential (primary) hypertension: Secondary | ICD-10-CM | POA: Diagnosis not present

## 2013-05-21 DIAGNOSIS — I959 Hypotension, unspecified: Secondary | ICD-10-CM | POA: Diagnosis not present

## 2013-05-21 NOTE — Telephone Encounter (Signed)
When she went to see patient today, she noticed patient was not breathing as good as she usually does. Also she is having a lot of chest congestion and maybe some wheezing. Would like a verbal ok to do a mobile chest xray to make sure nothing extra is going on with patient.

## 2013-05-22 DIAGNOSIS — R05 Cough: Secondary | ICD-10-CM | POA: Diagnosis not present

## 2013-05-22 NOTE — Telephone Encounter (Signed)
Aware and will call patient to schedule an appointment

## 2013-05-22 NOTE — Telephone Encounter (Signed)
Fine to do a mobile cxr, however this pt needs to be seen

## 2013-05-24 ENCOUNTER — Ambulatory Visit (INDEPENDENT_AMBULATORY_CARE_PROVIDER_SITE_OTHER): Payer: Medicare Other | Admitting: Internal Medicine

## 2013-05-24 ENCOUNTER — Encounter: Payer: Self-pay | Admitting: Internal Medicine

## 2013-05-24 VITALS — BP 114/60 | HR 67 | Temp 98.1°F | Wt 201.0 lb

## 2013-05-24 DIAGNOSIS — J209 Acute bronchitis, unspecified: Secondary | ICD-10-CM | POA: Diagnosis not present

## 2013-05-24 DIAGNOSIS — I10 Essential (primary) hypertension: Secondary | ICD-10-CM

## 2013-05-24 MED ORDER — AZITHROMYCIN 250 MG PO TABS: ORAL_TABLET | ORAL | Status: DC

## 2013-05-24 NOTE — Assessment & Plan Note (Signed)
BP Readings from Last 3 Encounters:  05/24/13 114/60  05/03/13 124/78  04/23/13 120/66   BP well controlled on current medications. Will continue.

## 2013-05-24 NOTE — Progress Notes (Signed)
Subjective:    Patient ID: Cindy Graham, female    DOB: [birth year], 80 y.o.   MRN: [id number]  HPI 80YO female with HTN, chronic back pain secondary to DJD presents for follow up. Her primary concern today is 1 week of cough which is occasionally productive of yellow sputum. She was evaluated by home health nurse who heard fluid in her lower lungs and requested CXR. CXR showed possible atelectasis versus pneumonitis bilateral lower lung fields. She denies shortness of breath, fever, chills, or chest pain.  In regards to BP, she notes that home BP cuff has been broken. She has been compliant with meds and denies and chest pain, palpitations, lightheadedness.  Outpatient Encounter Prescriptions as of 05/24/2013  Medication Sig  . aspirin 81 MG tablet Take 81 mg by mouth daily.    Marland Kitchen atenolol (TENORMIN) 25 MG tablet TAKE ONE (1) TABLET BY MOUTH EVERY      DAY  . Biotin 1000 MCG tablet Take 1,000 mcg by mouth daily.    . cholecalciferol (VITAMIN D) 400 UNITS TABS Take 400 Units by mouth 2 (two) times daily.    . enalapril (VASOTEC) 20 MG tablet Take 1 tablet by mouth Twice daily.  Marland Kitchen estrogens, conjugated, (PREMARIN) 0.3 MG tablet Take 1 tablet (0.3 mg total) by mouth daily.  . Flaxseed, Linseed, (FLAX SEED OIL) 1000 MG CAPS Take 1 capsule by mouth daily.    . fluticasone (FLOVENT HFA) 110 MCG/ACT inhaler Inhale 2 puffs into the lungs daily.  Marland Kitchen gabapentin (NEURONTIN) 300 MG capsule TAKE ONE (1) CAPSULE EACH DAY  . lidocaine (LIDODERM) 5 % Place 1 patch onto the skin every 12 (twelve) hours. Remove & Discard patch within 12 hours or as directed by MD  . Multiple Vitamin (MULTIVITAMIN) capsule Take 1 capsule by mouth daily.    . nitroGLYCERIN (NITROSTAT) 0.3 MG SL tablet Place 1 tablet (0.3 mg total) under the tongue every 5 (five) minutes as needed.  . NUCYNTA 50 MG TABS tablet Take 50 mg by mouth 2 (two) times daily as needed (Take 1 tablet twice a day as needed).   . nystatin (MYCOSTATIN/NYSTOP)  100000 UNIT/GM POWD Apply 1 application topically BID times 48H.  Marland Kitchen omeprazole (PRILOSEC) 20 MG capsule Take 1 capsule (20 mg total) by mouth daily.  Marland Kitchen oxybutynin (DITROPAN) 5 MG tablet Take 1 tablet (5 mg total) by mouth daily.  . sertraline (ZOLOFT) 100 MG tablet TAKE ONE (1) TABLET BY MOUTH EVERY DAY  . triamterene-hydrochlorothiazide (DYAZIDE) 37.5-25 MG per capsule Take 1 each (1 capsule total) by mouth daily.  . verapamil (CALAN-SR) 180 MG CR tablet Take 1 tablet (180 mg total) by mouth at bedtime.   BP 114/60  Pulse 67  Temp(Src) 98.1 F (36.7 C) (Oral)  Wt 201 lb (91.173 kg)  SpO2 92%  Review of Systems  Constitutional: Positive for fatigue. Negative for fever, chills and unexpected weight change.  HENT: Negative for congestion, ear discharge, ear pain, facial swelling, hearing loss, mouth sores, nosebleeds, postnasal drip, rhinorrhea, sinus pressure, sneezing, sore throat, tinnitus, trouble swallowing and voice change.   Eyes: Negative for pain, discharge, redness and visual disturbance.  Respiratory: Positive for cough. Negative for chest tightness, shortness of breath, wheezing and stridor.   Cardiovascular: Negative for chest pain, palpitations and leg swelling.  Musculoskeletal: Positive for arthralgias, back pain and myalgias. Negative for neck pain and neck stiffness.  Skin: Negative for color change and rash.  Neurological: Negative for dizziness, weakness, light-headedness and  headaches.  Hematological: Negative for adenopathy.       Objective:   Physical Exam  Constitutional: She is oriented to person, place, and time. She appears well-developed and well-nourished. No distress.  HENT:  Head: Normocephalic and atraumatic.  Right Ear: External ear normal.  Left Ear: External ear normal.  Nose: Nose normal.  Mouth/Throat: Oropharynx is clear and moist. No oropharyngeal exudate.  Eyes: Conjunctivae are normal. Pupils are equal, round, and reactive to light. Right eye  exhibits no discharge. Left eye exhibits no discharge. No scleral icterus.  Neck: Normal range of motion. Neck supple. No tracheal deviation present. No thyromegaly present.  Cardiovascular: Normal rate, regular rhythm, normal heart sounds and intact distal pulses.  Exam reveals no gallop and no friction rub.   No murmur heard. Pulmonary/Chest: Effort normal. No accessory muscle usage. Not tachypneic. No respiratory distress. She has no decreased breath sounds. She has no wheezes. She has rhonchi (few scattered rhonchi). She has no rales. She exhibits no tenderness.  Musculoskeletal: Normal range of motion. She exhibits no edema and no tenderness.  Lymphadenopathy:    She has no cervical adenopathy.  Neurological: She is alert and oriented to person, place, and time. No cranial nerve deficit. She exhibits normal muscle tone. Coordination normal.  Skin: Skin is warm and dry. No rash noted. She is not diaphoretic. No erythema. No pallor.  Psychiatric: She has a normal mood and affect. Her behavior is normal. Judgment and thought content normal.          Assessment & Plan:

## 2013-05-24 NOTE — Progress Notes (Signed)
Pre-visit discussion using our clinic review tool. No additional management support is needed unless otherwise documented below in the visit note.  

## 2013-05-24 NOTE — Assessment & Plan Note (Signed)
Symptoms and exam most consistent with early bronchitis. Will start Azithromycin. Encouraged her to use incentive spirometer. She will call or RTC if cough persistent or if any dyspnea, fever or other concerns.

## 2013-05-24 NOTE — Telephone Encounter (Signed)
Has an appointment today with Dr. Gilford Rile

## 2013-05-27 DIAGNOSIS — I959 Hypotension, unspecified: Secondary | ICD-10-CM | POA: Diagnosis not present

## 2013-05-27 DIAGNOSIS — K219 Gastro-esophageal reflux disease without esophagitis: Secondary | ICD-10-CM | POA: Diagnosis not present

## 2013-05-27 DIAGNOSIS — I10 Essential (primary) hypertension: Secondary | ICD-10-CM | POA: Diagnosis not present

## 2013-05-27 DIAGNOSIS — J45909 Unspecified asthma, uncomplicated: Secondary | ICD-10-CM | POA: Diagnosis not present

## 2013-05-27 DIAGNOSIS — M159 Polyosteoarthritis, unspecified: Secondary | ICD-10-CM | POA: Diagnosis not present

## 2013-05-27 DIAGNOSIS — I509 Heart failure, unspecified: Secondary | ICD-10-CM | POA: Diagnosis not present

## 2013-06-01 ENCOUNTER — Emergency Department: Payer: Self-pay | Admitting: Internal Medicine

## 2013-06-01 DIAGNOSIS — E869 Volume depletion, unspecified: Secondary | ICD-10-CM | POA: Diagnosis not present

## 2013-06-01 DIAGNOSIS — R112 Nausea with vomiting, unspecified: Secondary | ICD-10-CM | POA: Diagnosis not present

## 2013-06-01 DIAGNOSIS — N390 Urinary tract infection, site not specified: Secondary | ICD-10-CM | POA: Diagnosis not present

## 2013-06-01 DIAGNOSIS — R197 Diarrhea, unspecified: Secondary | ICD-10-CM | POA: Diagnosis not present

## 2013-06-01 DIAGNOSIS — R1110 Vomiting, unspecified: Secondary | ICD-10-CM | POA: Diagnosis not present

## 2013-06-01 LAB — URINALYSIS, COMPLETE
Bilirubin,UR: NEGATIVE
Blood: NEGATIVE
GLUCOSE, UR: NEGATIVE mg/dL (ref 0–75)
Hyaline Cast: 7
KETONE: NEGATIVE
Nitrite: POSITIVE
PH: 5 (ref 4.5–8.0)
PROTEIN: NEGATIVE
SPECIFIC GRAVITY: 1.018 (ref 1.003–1.030)
Squamous Epithelial: 2

## 2013-06-01 LAB — CBC
HCT: 40.9 % (ref 35.0–47.0)
HGB: 13.9 g/dL (ref 12.0–16.0)
MCH: 32.7 pg (ref 26.0–34.0)
MCHC: 33.9 g/dL (ref 32.0–36.0)
MCV: 96 fL (ref 80–100)
Platelet: 229 10*3/uL (ref 150–440)
RBC: 4.24 10*6/uL (ref 3.80–5.20)
RDW: 13.7 % (ref 11.5–14.5)
WBC: 12 10*3/uL — AB (ref 3.6–11.0)

## 2013-06-01 LAB — COMPREHENSIVE METABOLIC PANEL
ALT: 24 U/L (ref 12–78)
Albumin: 3.3 g/dL — ABNORMAL LOW (ref 3.4–5.0)
Alkaline Phosphatase: 65 U/L
Anion Gap: 7 (ref 7–16)
BUN: 23 mg/dL — ABNORMAL HIGH (ref 7–18)
Bilirubin,Total: 0.5 mg/dL (ref 0.2–1.0)
Calcium, Total: 9 mg/dL (ref 8.5–10.1)
Chloride: 106 mmol/L (ref 98–107)
Co2: 23 mmol/L (ref 21–32)
Creatinine: 1.04 mg/dL (ref 0.60–1.30)
EGFR (African American): 55 — ABNORMAL LOW
EGFR (Non-African Amer.): 48 — ABNORMAL LOW
GLUCOSE: 147 mg/dL — AB (ref 65–99)
Osmolality: 278 (ref 275–301)
Potassium: 3.6 mmol/L (ref 3.5–5.1)
SGOT(AST): 29 U/L (ref 15–37)
Sodium: 136 mmol/L (ref 136–145)
Total Protein: 7.4 g/dL (ref 6.4–8.2)

## 2013-06-01 LAB — LIPASE, BLOOD: Lipase: 140 U/L (ref 73–393)

## 2013-06-03 ENCOUNTER — Telehealth: Payer: Self-pay | Admitting: Internal Medicine

## 2013-06-03 DIAGNOSIS — K219 Gastro-esophageal reflux disease without esophagitis: Secondary | ICD-10-CM | POA: Diagnosis not present

## 2013-06-03 DIAGNOSIS — J45909 Unspecified asthma, uncomplicated: Secondary | ICD-10-CM | POA: Diagnosis not present

## 2013-06-03 DIAGNOSIS — I10 Essential (primary) hypertension: Secondary | ICD-10-CM | POA: Diagnosis not present

## 2013-06-03 DIAGNOSIS — M159 Polyosteoarthritis, unspecified: Secondary | ICD-10-CM | POA: Diagnosis not present

## 2013-06-03 DIAGNOSIS — I509 Heart failure, unspecified: Secondary | ICD-10-CM | POA: Diagnosis not present

## 2013-06-03 DIAGNOSIS — I959 Hypotension, unspecified: Secondary | ICD-10-CM | POA: Diagnosis not present

## 2013-06-03 NOTE — Telephone Encounter (Signed)
Where can we put her, I do not see anything?

## 2013-06-03 NOTE — Telephone Encounter (Signed)
May use 88min visit.

## 2013-06-03 NOTE — Telephone Encounter (Signed)
Pt was seen in ER Saturday a.m. for UTI, diarrhea, vomiting.  Discharged same day.  Needs f/u visit.  No upcoming 30 min appt available.  Please advise.

## 2013-06-04 ENCOUNTER — Ambulatory Visit (INDEPENDENT_AMBULATORY_CARE_PROVIDER_SITE_OTHER): Payer: Medicare Other | Admitting: Internal Medicine

## 2013-06-04 ENCOUNTER — Encounter: Payer: Self-pay | Admitting: Internal Medicine

## 2013-06-04 ENCOUNTER — Telehealth: Payer: Self-pay | Admitting: Internal Medicine

## 2013-06-04 VITALS — BP 100/50 | HR 72 | Temp 98.0°F | Wt 198.0 lb

## 2013-06-04 DIAGNOSIS — I10 Essential (primary) hypertension: Secondary | ICD-10-CM | POA: Diagnosis not present

## 2013-06-04 DIAGNOSIS — N390 Urinary tract infection, site not specified: Secondary | ICD-10-CM | POA: Diagnosis not present

## 2013-06-04 LAB — COMPREHENSIVE METABOLIC PANEL
ALBUMIN: 3.3 g/dL — AB (ref 3.5–5.2)
ALT: 22 U/L (ref 0–35)
AST: 30 U/L (ref 0–37)
Alkaline Phosphatase: 48 U/L (ref 39–117)
BUN: 21 mg/dL (ref 6–23)
CALCIUM: 8.8 mg/dL (ref 8.4–10.5)
CHLORIDE: 104 meq/L (ref 96–112)
CO2: 25 meq/L (ref 19–32)
CREATININE: 1.3 mg/dL — AB (ref 0.4–1.2)
GFR: 41.65 mL/min — AB (ref >60.00)
Glucose, Bld: 95 mg/dL (ref 70–99)
Potassium: 3.6 mEq/L (ref 3.5–5.1)
Sodium: 138 mEq/L (ref 135–145)
Total Bilirubin: 0.7 mg/dL (ref 0.3–1.2)
Total Protein: 6.6 g/dL (ref 6.0–8.3)

## 2013-06-04 LAB — CBC WITH DIFFERENTIAL/PLATELET
Basophils Absolute: 0 10*3/uL (ref 0.0–0.1)
Basophils Relative: 0.3 % (ref 0.0–3.0)
EOS ABS: 0.1 10*3/uL (ref 0.0–0.7)
Eosinophils Relative: 1.8 % (ref 0.0–5.0)
HCT: 38.2 % (ref 36.0–46.0)
HEMOGLOBIN: 12.5 g/dL (ref 12.0–15.0)
Lymphocytes Relative: 27.6 % (ref 12.0–46.0)
Lymphs Abs: 1.8 10*3/uL (ref 0.7–4.0)
MCHC: 32.8 g/dL (ref 30.0–36.0)
MCV: 96.6 fl (ref 78.0–100.0)
MONOS PCT: 9.7 % (ref 3.0–12.0)
Monocytes Absolute: 0.6 10*3/uL (ref 0.1–1.0)
NEUTROS ABS: 4 10*3/uL (ref 1.4–7.7)
NEUTROS PCT: 60.6 % (ref 43.0–77.0)
PLATELETS: 258 10*3/uL (ref 150.0–400.0)
RBC: 3.96 Mil/uL (ref 3.87–5.11)
RDW: 14.3 % (ref 11.5–14.6)
WBC: 6.6 10*3/uL (ref 4.5–10.5)

## 2013-06-04 LAB — POCT URINALYSIS DIPSTICK
Glucose, UA: NEGATIVE
Nitrite, UA: NEGATIVE
PROTEIN UA: NEGATIVE
RBC UA: NEGATIVE
Spec Grav, UA: 1.025
Urobilinogen, UA: 0.2
pH, UA: 5.5

## 2013-06-04 NOTE — Telephone Encounter (Signed)
Fine to give verbal order.  

## 2013-06-04 NOTE — Telephone Encounter (Signed)
I reviewed notes from [facility] including urine culture. The urine culture grew an infection called Klebsiella. It was sensitive to most antibiotics, but from what I can tell she is allergic to all of the options except for cipro. Can you ask if she has ever taken Keflex? If yes, is she allergic to Keflex?

## 2013-06-04 NOTE — Progress Notes (Signed)
Subjective:    Patient ID: Cindy Graham, female    DOB: [birth year], 80 y.o.   MRN: [id number]  HPI 80YO female with h/o hypertension, osteoarthritis presents for follow up after recent ED evaluatoin. Saturday, went to ED with diarrhea and vomiting. Diagnosed with UTI, treated with Cipro. No dysuria, urgency, fever, chills. Nausea, vomiting, diarrhea have resolved. Still feeling weak. Denies any other focal symptoms. Following a bland diet with chicken soup, crackers. Tolerating well.  Outpatient Encounter Prescriptions as of 06/04/2013  Medication Sig  . aspirin 81 MG tablet Take 81 mg by mouth daily.    Marland Kitchen atenolol (TENORMIN) 25 MG tablet TAKE ONE (1) TABLET BY MOUTH EVERY      DAY  . Biotin 1000 MCG tablet Take 1,000 mcg by mouth daily.    . cholecalciferol (VITAMIN D) 400 UNITS TABS Take 400 Units by mouth 2 (two) times daily.    . ciprofloxacin (CIPRO) 250 MG tablet Take 250 mg by mouth 2 (two) times daily.  . enalapril (VASOTEC) 20 MG tablet Take 1 tablet by mouth Twice daily.  Marland Kitchen estrogens, conjugated, (PREMARIN) 0.3 MG tablet Take 1 tablet (0.3 mg total) by mouth daily.  . Flaxseed, Linseed, (FLAX SEED OIL) 1000 MG CAPS Take 1 capsule by mouth daily.    . fluticasone (FLOVENT HFA) 110 MCG/ACT inhaler Inhale 2 puffs into the lungs daily.  Marland Kitchen gabapentin (NEURONTIN) 300 MG capsule TAKE ONE (1) CAPSULE EACH DAY  . lidocaine (LIDODERM) 5 % Place 1 patch onto the skin every 12 (twelve) hours. Remove & Discard patch within 12 hours or as directed by MD  . Multiple Vitamin (MULTIVITAMIN) capsule Take 1 capsule by mouth daily.    . nitroGLYCERIN (NITROSTAT) 0.3 MG SL tablet Place 1 tablet (0.3 mg total) under the tongue every 5 (five) minutes as needed.  . NUCYNTA 50 MG TABS tablet Take 50 mg by mouth 2 (two) times daily as needed (Take 1 tablet twice a day as needed).   . nystatin (MYCOSTATIN/NYSTOP) 100000 UNIT/GM POWD Apply 1 application topically BID times 48H.  Marland Kitchen omeprazole (PRILOSEC) 20  MG capsule Take 1 capsule (20 mg total) by mouth daily.  Marland Kitchen oxybutynin (DITROPAN) 5 MG tablet Take 1 tablet (5 mg total) by mouth daily.  . sertraline (ZOLOFT) 100 MG tablet TAKE ONE (1) TABLET BY MOUTH EVERY DAY  . triamterene-hydrochlorothiazide (DYAZIDE) 37.5-25 MG per capsule Take 1 each (1 capsule total) by mouth daily.  . verapamil (CALAN-SR) 180 MG CR tablet Take 1 tablet (180 mg total) by mouth at bedtime.  . [DISCONTINUED] azithromycin (ZITHROMAX Z-PAK) 250 MG tablet Take 2 pills day 1, then 1 pill daily days 2-5     Review of Systems  Constitutional: Positive for fatigue. Negative for fever, chills, appetite change and unexpected weight change.  HENT: Negative for congestion, ear pain, sinus pressure, sore throat, trouble swallowing and voice change.   Eyes: Negative for visual disturbance.  Respiratory: Negative for cough, shortness of breath, wheezing and stridor.   Cardiovascular: Negative for chest pain, palpitations and leg swelling.  Gastrointestinal: Negative for nausea, vomiting, abdominal pain, diarrhea, constipation, blood in stool, abdominal distention and anal bleeding.  Genitourinary: Negative for dysuria, urgency, frequency, hematuria, flank pain, difficulty urinating and pelvic pain.  Musculoskeletal: Negative for arthralgias, gait problem, myalgias and neck pain.  Skin: Negative for color change and rash.  Neurological: Positive for weakness. Negative for dizziness and headaches.  Hematological: Negative for adenopathy. Does not bruise/bleed easily.  Psychiatric/Behavioral: Negative  for suicidal ideas, sleep disturbance and dysphoric mood. The patient is not nervous/anxious.        Objective:   Physical Exam  Constitutional: She is oriented to person, place, and time. She appears well-developed and well-nourished. No distress.  HENT:  Head: Normocephalic and atraumatic.  Right Ear: External ear normal.  Left Ear: External ear normal.  Nose: Nose normal.    Mouth/Throat: Oropharynx is clear and moist. No oropharyngeal exudate.  Eyes: Conjunctivae are normal. Pupils are equal, round, and reactive to light. Right eye exhibits no discharge. Left eye exhibits no discharge. No scleral icterus.  Neck: Normal range of motion. Neck supple. No tracheal deviation present. No thyromegaly present.  Cardiovascular: Normal rate, regular rhythm, normal heart sounds and intact distal pulses.  Exam reveals no gallop and no friction rub.   No murmur heard. Pulmonary/Chest: Effort normal and breath sounds normal. No accessory muscle usage. Not tachypneic. No respiratory distress. She has no decreased breath sounds. She has no wheezes. She has no rhonchi. She has no rales. She exhibits no tenderness.  Musculoskeletal: Normal range of motion. She exhibits no edema and no tenderness.  Lymphadenopathy:    She has no cervical adenopathy.  Neurological: She is alert and oriented to person, place, and time. No cranial nerve deficit. She exhibits normal muscle tone. Coordination normal.  Skin: Skin is warm and dry. No rash noted. She is not diaphoretic. No erythema. No pallor.  Psychiatric: She has a normal mood and affect. Her behavior is normal. Judgment and thought content normal.          Assessment & Plan:

## 2013-06-04 NOTE — Telephone Encounter (Signed)
OK. If she has problems with Cipro, such as rapid heart rate, then she should let us know and we can consider Keflex.

## 2013-06-04 NOTE — Telephone Encounter (Signed)
Wyatt Haste nurse with [facility] left a message stating they need a new order to increase skilled home nursing to once a week. Can call her at [phone] or the [place] office at [phone]

## 2013-06-04 NOTE — Telephone Encounter (Signed)
Verbal ok given and patient is coming in today for follow up

## 2013-06-04 NOTE — Progress Notes (Signed)
Pre-visit discussion using our clinic review tool. No additional management support is needed unless otherwise documented below in the visit note.  

## 2013-06-04 NOTE — Telephone Encounter (Signed)
Patient informed to continue taking the Cipro, not sure if she has ever taken Keflex.

## 2013-06-04 NOTE — Patient Instructions (Signed)
Stop Maxide (triamterene-HCTZ).  Follow up 1 week

## 2013-06-05 DIAGNOSIS — N390 Urinary tract infection, site not specified: Secondary | ICD-10-CM | POA: Insufficient documentation

## 2013-06-05 LAB — CULTURE, URINE COMPREHENSIVE
Colony Count: NO GROWTH
ORGANISM ID, BACTERIA: NO GROWTH

## 2013-06-05 NOTE — Assessment & Plan Note (Signed)
BP Readings from Last 3 Encounters:  06/04/13 100/50  05/24/13 114/60  05/03/13 124/78   Blood pressure continues to be low. Will stop triamterene hydrochlorothiazide. For now, continue verapamil, enalapril, and atenolol. However, we discussed potentially stopping verapamil if persistent low blood pressure. Recheck 1 week or sooner as needed.

## 2013-06-05 NOTE — Telephone Encounter (Signed)
Pt notified and verbalized understanding.

## 2013-06-05 NOTE — Assessment & Plan Note (Signed)
Reviewed recent notes from her hospitalization which showed urinary tract infection with Klebsiella. This was sensitive to Cipro. Encouraged her to complete course of Cipro. She has intermittently had some palpitations which she is concerned may be related to Cipro. If these continue, we discussed potentially changing to Keflex. Repeat urinalysis today shows no infection, however we will sent for culture to confirm infection has cleared. If any recurrent symptoms, return to clinic. Encouraged her to continue increased fluids as tolerated.

## 2013-06-07 ENCOUNTER — Telehealth: Payer: Self-pay | Admitting: Internal Medicine

## 2013-06-07 ENCOUNTER — Emergency Department: Payer: Self-pay | Admitting: Emergency Medicine

## 2013-06-07 ENCOUNTER — Ambulatory Visit: Payer: Medicare Other | Admitting: Internal Medicine

## 2013-06-07 DIAGNOSIS — R0989 Other specified symptoms and signs involving the circulatory and respiratory systems: Secondary | ICD-10-CM | POA: Diagnosis not present

## 2013-06-07 DIAGNOSIS — I509 Heart failure, unspecified: Secondary | ICD-10-CM | POA: Diagnosis not present

## 2013-06-07 DIAGNOSIS — Z79899 Other long term (current) drug therapy: Secondary | ICD-10-CM | POA: Diagnosis not present

## 2013-06-07 DIAGNOSIS — R609 Edema, unspecified: Secondary | ICD-10-CM | POA: Diagnosis not present

## 2013-06-07 DIAGNOSIS — J45909 Unspecified asthma, uncomplicated: Secondary | ICD-10-CM | POA: Diagnosis not present

## 2013-06-07 DIAGNOSIS — I959 Hypotension, unspecified: Secondary | ICD-10-CM | POA: Diagnosis not present

## 2013-06-07 DIAGNOSIS — Z859 Personal history of malignant neoplasm, unspecified: Secondary | ICD-10-CM | POA: Diagnosis not present

## 2013-06-07 DIAGNOSIS — I10 Essential (primary) hypertension: Secondary | ICD-10-CM | POA: Diagnosis not present

## 2013-06-07 DIAGNOSIS — Z7982 Long term (current) use of aspirin: Secondary | ICD-10-CM | POA: Diagnosis not present

## 2013-06-07 DIAGNOSIS — K219 Gastro-esophageal reflux disease without esophagitis: Secondary | ICD-10-CM | POA: Diagnosis not present

## 2013-06-07 DIAGNOSIS — Z880 Allergy status to penicillin: Secondary | ICD-10-CM | POA: Diagnosis not present

## 2013-06-07 DIAGNOSIS — M159 Polyosteoarthritis, unspecified: Secondary | ICD-10-CM | POA: Diagnosis not present

## 2013-06-07 DIAGNOSIS — Z888 Allergy status to other drugs, medicaments and biological substances status: Secondary | ICD-10-CM | POA: Diagnosis not present

## 2013-06-07 LAB — CBC
HCT: 38.7 % (ref 35.0–47.0)
HGB: 12.9 g/dL (ref 12.0–16.0)
MCH: 32.3 pg (ref 26.0–34.0)
MCHC: 33.3 g/dL (ref 32.0–36.0)
MCV: 97 fL (ref 80–100)
Platelet: 255 10*3/uL (ref 150–440)
RBC: 4 10*6/uL (ref 3.80–5.20)
RDW: 14.1 % (ref 11.5–14.5)
WBC: 8.2 10*3/uL (ref 3.6–11.0)

## 2013-06-07 LAB — BASIC METABOLIC PANEL
Anion Gap: 6 — ABNORMAL LOW (ref 7–16)
BUN: 22 mg/dL — ABNORMAL HIGH (ref 7–18)
CO2: 27 mmol/L (ref 21–32)
CREATININE: 1.27 mg/dL (ref 0.60–1.30)
Calcium, Total: 9.1 mg/dL (ref 8.5–10.1)
Chloride: 108 mmol/L — ABNORMAL HIGH (ref 98–107)
EGFR (African American): 43 — ABNORMAL LOW
EGFR (Non-African Amer.): 37 — ABNORMAL LOW
GLUCOSE: 101 mg/dL — AB (ref 65–99)
Osmolality: 285 (ref 275–301)
Potassium: 3.8 mmol/L (ref 3.5–5.1)
Sodium: 141 mmol/L (ref 136–145)

## 2013-06-07 LAB — PRO B NATRIURETIC PEPTIDE: B-Type Natriuretic Peptide: 599 pg/mL — ABNORMAL HIGH (ref 0–450)

## 2013-06-07 LAB — TROPONIN I

## 2013-06-07 NOTE — Telephone Encounter (Signed)
Called and spoke with patient, informed her of the need to be evaluated tonight for chance of a blood clot. Per patient she states she is feeling better since the home health nurse came out there, she only has a little swelling in her leg. Per patient she will think about it a little bit if she go to get evaluated tonight. Does not think it is that serious.

## 2013-06-07 NOTE — Telephone Encounter (Signed)
Pt called to say she is feeling better  Just weak.  She stated her bp was 97/49  pt wanted to talk to triage nurse Transferred called to [place]

## 2013-06-07 NOTE — Telephone Encounter (Signed)
If feeling poorly, we could see her tomorrow. However, if she has swelling in left leg, then should be evaluated tonight, as may need doppler to evaluate for clot.

## 2013-06-07 NOTE — Telephone Encounter (Signed)
[name], nurse from [facility] left a voicemail in reference to patient. Just felt like she needs to call to inform Dr. Gilford Rile about patient. She went out there today and when patient took her BP on the teleonomy it was kind of low but when she took it, it was ok as listed below.   BP 126/64   Weight 1/14 :195 , 1/15 : 196 and today 1/16 197, there is a slight increase in patient weight.   O2 was 96% at rest   She does hear a little crackling in the base of both lungs but nothing acute. Does not have a cough, little swelling in left lower leg. Not sure what you would like done but wanted to make you aware. Patient stated she does feel worse today than she did yesterday.

## 2013-06-12 DIAGNOSIS — I10 Essential (primary) hypertension: Secondary | ICD-10-CM | POA: Diagnosis not present

## 2013-06-12 DIAGNOSIS — I959 Hypotension, unspecified: Secondary | ICD-10-CM | POA: Diagnosis not present

## 2013-06-12 DIAGNOSIS — K219 Gastro-esophageal reflux disease without esophagitis: Secondary | ICD-10-CM | POA: Diagnosis not present

## 2013-06-12 DIAGNOSIS — J45909 Unspecified asthma, uncomplicated: Secondary | ICD-10-CM | POA: Diagnosis not present

## 2013-06-12 DIAGNOSIS — I509 Heart failure, unspecified: Secondary | ICD-10-CM | POA: Diagnosis not present

## 2013-06-12 DIAGNOSIS — M159 Polyosteoarthritis, unspecified: Secondary | ICD-10-CM | POA: Diagnosis not present

## 2013-06-13 ENCOUNTER — Ambulatory Visit (INDEPENDENT_AMBULATORY_CARE_PROVIDER_SITE_OTHER): Payer: Medicare Other | Admitting: Internal Medicine

## 2013-06-13 ENCOUNTER — Encounter: Payer: Self-pay | Admitting: Internal Medicine

## 2013-06-13 ENCOUNTER — Telehealth: Payer: Self-pay | Admitting: *Deleted

## 2013-06-13 VITALS — BP 142/60 | HR 64 | Temp 98.2°F | Wt 204.0 lb

## 2013-06-13 DIAGNOSIS — I10 Essential (primary) hypertension: Secondary | ICD-10-CM

## 2013-06-13 DIAGNOSIS — N390 Urinary tract infection, site not specified: Secondary | ICD-10-CM

## 2013-06-13 LAB — POCT URINALYSIS DIPSTICK
GLUCOSE UA: NEGATIVE
Ketones, UA: NEGATIVE
Nitrite, UA: NEGATIVE
PH UA: 5.5
Protein, UA: NEGATIVE
Spec Grav, UA: >=1.03
UROBILINOGEN UA: 0.2

## 2013-06-13 MED ORDER — TRIAMTERENE-HCTZ 37.5-25 MG PO CAPS: 1.0000 | ORAL_CAPSULE | Freq: Every day | ORAL | Status: DC

## 2013-06-13 NOTE — Progress Notes (Signed)
Pre-visit discussion using our clinic review tool. No additional management support is needed unless otherwise documented below in the visit note.  

## 2013-06-13 NOTE — Patient Instructions (Signed)
Start back on Triamterene-HCTZ once daily in the morning.  Follow up in 2 weeks or sooner if needed.

## 2013-06-13 NOTE — Telephone Encounter (Signed)
Tosha from [facility] called stating patient weight has increased 7 lbs over a week and 2 lbs overnight. Aware of appointment today but was calling with a head up

## 2013-06-13 NOTE — Telephone Encounter (Signed)
I saw pt today. We restarted her Triamterene-HCTZ.

## 2013-06-15 LAB — CULTURE, URINE COMPREHENSIVE
Colony Count: NO GROWTH
ORGANISM ID, BACTERIA: NO GROWTH

## 2013-06-15 NOTE — Assessment & Plan Note (Signed)
Urine continues to be positive for blood. However, urine culture was negative. Will set up urology evaluation. Question if she may need cystoscopy.

## 2013-06-15 NOTE — Assessment & Plan Note (Signed)
BP Readings from Last 3 Encounters:  06/13/13 142/60  06/04/13 100/50  05/24/13 114/60   Blood pressures recently higher since stopping triamterene hydrochlorothiazide. Will resume triamterene hydrochlorothiazide once daily and monitor blood pressure and lower extremity edema closely.

## 2013-06-15 NOTE — Progress Notes (Signed)
Subjective:    Patient ID: Cindy Graham, female    DOB: 02/26/1922, 80 y.o.   MRN: [id number]  HPI 80 year old female with history of hypertension, osteoarthritis, and recent urinary tract infection presents for followup. She reports that she is generally feeling well. At her last visit, we stopped her triamterene hydrochlorothiazide because she had some low blood pressure readings. She reports increased lower extremity edema after doing this. Blood pressure readings have also been more elevated. Typically greater than 140/90. She denies chest pain or palpitations. She denies any symptoms of recurrent urinary tract infection such as dysuria, urinary frequency,, urgency, fever, chills, flank pain.  Outpatient Encounter Prescriptions as of 06/13/2013  Medication Sig  . aspirin 81 MG tablet Take 81 mg by mouth daily.    Marland Kitchen atenolol (TENORMIN) 25 MG tablet TAKE ONE (1) TABLET BY MOUTH EVERY      DAY  . Biotin 1000 MCG tablet Take 1,000 mcg by mouth daily.    . cholecalciferol (VITAMIN D) 400 UNITS TABS Take 400 Units by mouth 2 (two) times daily.    . enalapril (VASOTEC) 20 MG tablet Take 1 tablet by mouth Twice daily.  Marland Kitchen estrogens, conjugated, (PREMARIN) 0.3 MG tablet Take 1 tablet (0.3 mg total) by mouth daily.  . Flaxseed, Linseed, (FLAX SEED OIL) 1000 MG CAPS Take 1 capsule by mouth daily.    . fluticasone (FLOVENT HFA) 110 MCG/ACT inhaler Inhale 2 puffs into the lungs daily.  Marland Kitchen gabapentin (NEURONTIN) 300 MG capsule TAKE ONE (1) CAPSULE EACH DAY  . lidocaine (LIDODERM) 5 % Place 1 patch onto the skin every 12 (twelve) hours. Remove & Discard patch within 12 hours or as directed by MD  . Multiple Vitamin (MULTIVITAMIN) capsule Take 1 capsule by mouth daily.    . nitroGLYCERIN (NITROSTAT) 0.3 MG SL tablet Place 1 tablet (0.3 mg total) under the tongue every 5 (five) minutes as needed.  . NUCYNTA 50 MG TABS tablet Take 50 mg by mouth 2 (two) times daily as needed (Take 1 tablet twice a day as  needed).   . nystatin (MYCOSTATIN/NYSTOP) 100000 UNIT/GM POWD Apply 1 application topically BID times 48H.  Marland Kitchen omeprazole (PRILOSEC) 20 MG capsule Take 1 capsule (20 mg total) by mouth daily.  Marland Kitchen oxybutynin (DITROPAN) 5 MG tablet Take 1 tablet (5 mg total) by mouth daily.  . sertraline (ZOLOFT) 100 MG tablet TAKE ONE (1) TABLET BY MOUTH EVERY DAY  . verapamil (CALAN-SR) 180 MG CR tablet Take 1 tablet (180 mg total) by mouth at bedtime.  . triamterene-hydrochlorothiazide (DYAZIDE) 37.5-25 MG per capsule Take 1 each (1 capsule total) by mouth daily.   BP 142/60  Pulse 64  Temp(Src) 98.2 F (36.8 C) (Oral)  Wt 204 lb (92.534 kg)  SpO2 93%  Review of Systems  Constitutional: Negative for fever, chills, appetite change, fatigue and unexpected weight change.  HENT: Negative for congestion, ear pain, sinus pressure, sore throat, trouble swallowing and voice change.   Eyes: Negative for visual disturbance.  Respiratory: Negative for cough, shortness of breath, wheezing and stridor.   Cardiovascular: Negative for chest pain, palpitations and leg swelling.  Gastrointestinal: Negative for nausea, vomiting, abdominal pain, diarrhea, constipation, blood in stool, abdominal distention and anal bleeding.  Genitourinary: Negative for dysuria and flank pain.  Musculoskeletal: Negative for arthralgias, gait problem, myalgias and neck pain.  Skin: Negative for color change and rash.  Neurological: Negative for dizziness and headaches.  Hematological: Negative for adenopathy. Does not bruise/bleed easily.  Psychiatric/Behavioral: Negative for suicidal ideas, sleep disturbance and dysphoric mood. The patient is not nervous/anxious.        Objective:   Physical Exam  Constitutional: She is oriented to person, place, and time. She appears well-developed and well-nourished. No distress.  HENT:  Head: Normocephalic and atraumatic.  Right Ear: External ear normal.  Left Ear: External ear normal.  Nose:  Nose normal.  Mouth/Throat: Oropharynx is clear and moist. No oropharyngeal exudate.  Eyes: Conjunctivae are normal. Pupils are equal, round, and reactive to light. Right eye exhibits no discharge. Left eye exhibits no discharge. No scleral icterus.  Neck: Normal range of motion. Neck supple. No tracheal deviation present. No thyromegaly present.  Cardiovascular: Normal rate, regular rhythm, normal heart sounds and intact distal pulses.  Exam reveals no gallop and no friction rub.   No murmur heard. Pulmonary/Chest: Effort normal and breath sounds normal. No accessory muscle usage. Not tachypneic. No respiratory distress. She has no decreased breath sounds. She has no wheezes. She has no rhonchi. She has no rales. She exhibits no tenderness.  Musculoskeletal: Normal range of motion. She exhibits no edema and no tenderness.  Lymphadenopathy:    She has no cervical adenopathy.  Neurological: She is alert and oriented to person, place, and time. No cranial nerve deficit. She exhibits normal muscle tone. Coordination normal.  Skin: Skin is warm and dry. No rash noted. She is not diaphoretic. No erythema. No pallor.  Psychiatric: She has a normal mood and affect. Her behavior is normal. Judgment and thought content normal.          Assessment & Plan:

## 2013-06-17 ENCOUNTER — Telehealth: Payer: Self-pay | Admitting: *Deleted

## 2013-06-17 ENCOUNTER — Encounter: Payer: Self-pay | Admitting: Internal Medicine

## 2013-06-17 DIAGNOSIS — J45909 Unspecified asthma, uncomplicated: Secondary | ICD-10-CM | POA: Diagnosis not present

## 2013-06-17 DIAGNOSIS — I959 Hypotension, unspecified: Secondary | ICD-10-CM | POA: Diagnosis not present

## 2013-06-17 DIAGNOSIS — I10 Essential (primary) hypertension: Secondary | ICD-10-CM | POA: Diagnosis not present

## 2013-06-17 DIAGNOSIS — K219 Gastro-esophageal reflux disease without esophagitis: Secondary | ICD-10-CM | POA: Diagnosis not present

## 2013-06-17 DIAGNOSIS — I509 Heart failure, unspecified: Secondary | ICD-10-CM | POA: Diagnosis not present

## 2013-06-17 DIAGNOSIS — M159 Polyosteoarthritis, unspecified: Secondary | ICD-10-CM | POA: Diagnosis not present

## 2013-06-17 NOTE — Telephone Encounter (Signed)
Please have her STOP the triamterine. She seems more sensitive to this lately with hypotension. Have her monitor BP daily and email or call with reading.

## 2013-06-17 NOTE — Telephone Encounter (Signed)
Informed patient and Judeen Hammans to stop taking the Triamterene and patient both verbalized understanding.

## 2013-06-17 NOTE — Telephone Encounter (Signed)
Judeen Hammans called and spoke with me in reference to patient, state patient informed her before she took her BP medications her BP was 1028/52. Then after she took them her BP dropped to 82/40, she did feel a little dizzy but is fine now. When homehealth took patient BP it was 124/60 sitting on her left arm and 122/60 standing in her left arm, right arm was 112/56. And patient informed her she was put back her Triamterene at her visit last week with Dr. Gilford Rile. Informed her to have patient monitor BP and I would pass this on to Dr. Gilford Rile

## 2013-06-20 ENCOUNTER — Telehealth: Payer: Self-pay | Admitting: Internal Medicine

## 2013-06-20 NOTE — Telephone Encounter (Signed)
° °  lidocaine (LIDODERM) 5 %

## 2013-06-24 DIAGNOSIS — M159 Polyosteoarthritis, unspecified: Secondary | ICD-10-CM | POA: Diagnosis not present

## 2013-06-24 DIAGNOSIS — K219 Gastro-esophageal reflux disease without esophagitis: Secondary | ICD-10-CM | POA: Diagnosis not present

## 2013-06-24 DIAGNOSIS — I10 Essential (primary) hypertension: Secondary | ICD-10-CM | POA: Diagnosis not present

## 2013-06-24 DIAGNOSIS — J45909 Unspecified asthma, uncomplicated: Secondary | ICD-10-CM | POA: Diagnosis not present

## 2013-06-24 DIAGNOSIS — I959 Hypotension, unspecified: Secondary | ICD-10-CM | POA: Diagnosis not present

## 2013-06-24 DIAGNOSIS — I509 Heart failure, unspecified: Secondary | ICD-10-CM | POA: Diagnosis not present

## 2013-06-24 NOTE — Telephone Encounter (Signed)
This was ordered by Dr. Gilford Rile 04/23/13 with 3 refills. Should be on for 12 hours and then remove. How many is she using?

## 2013-06-24 NOTE — Telephone Encounter (Signed)
Ok to refill her lidoderm patches.

## 2013-06-25 NOTE — Telephone Encounter (Signed)
Fwd to Dr. Walker 

## 2013-06-25 NOTE — Telephone Encounter (Signed)
OK. I will fill out prior auth paperwork once we receive it.

## 2013-06-25 NOTE — Telephone Encounter (Signed)
Spoke with patient, insurance is not covering them. Received a letter from [employer] and the pharmacy also told her she would need prior autho. Pharmacist called about obtaining prior autho not for a refill request. Informed patient this process will get started and it usually takes a couple days.

## 2013-06-25 NOTE — Telephone Encounter (Signed)
So, are you forwarding this to Dr. Gilford Rile?

## 2013-06-26 ENCOUNTER — Telehealth: Payer: Self-pay | Admitting: Internal Medicine

## 2013-06-26 NOTE — Telephone Encounter (Signed)
That should be fine, unless symptoms are worsening.

## 2013-06-26 NOTE — Telephone Encounter (Signed)
Where can she be added, already has an appointment on 2/16

## 2013-06-26 NOTE — Telephone Encounter (Signed)
There was nothing for tomorrow but there was something for Friday at 215, will this be ok?

## 2013-06-26 NOTE — Telephone Encounter (Signed)
Linda from home health left message stating patient weight is up 2 pounds since last night and feels like her edema has increase, it is 2+ now. She is worried she may have fluid in her lungs, she does hear some crackling in there. Her vitals are good, BP was 140/70 and her O2 remains around 95-97% but once she becomes mobile it does decrease to around 93%.

## 2013-06-26 NOTE — Telephone Encounter (Signed)
I think there is a same day tomorrow?

## 2013-06-26 NOTE — Telephone Encounter (Signed)
Relevant patient education mailed to patient.  

## 2013-06-26 NOTE — Telephone Encounter (Signed)
She will need to be seen for evaluation.

## 2013-06-27 NOTE — Telephone Encounter (Signed)
Patient confirmed appointment time again today for 215 tomorrow. Nurse [name] was also made aware of this appointment.

## 2013-06-28 ENCOUNTER — Encounter: Payer: Self-pay | Admitting: Internal Medicine

## 2013-06-28 ENCOUNTER — Ambulatory Visit (INDEPENDENT_AMBULATORY_CARE_PROVIDER_SITE_OTHER): Payer: Medicare Other | Admitting: Internal Medicine

## 2013-06-28 VITALS — BP 138/60 | HR 75 | Temp 98.5°F | Wt 210.0 lb

## 2013-06-28 DIAGNOSIS — G8929 Other chronic pain: Secondary | ICD-10-CM

## 2013-06-28 DIAGNOSIS — N183 Chronic kidney disease, stage 3 unspecified: Secondary | ICD-10-CM

## 2013-06-28 DIAGNOSIS — Z23 Encounter for immunization: Secondary | ICD-10-CM | POA: Diagnosis not present

## 2013-06-28 DIAGNOSIS — I10 Essential (primary) hypertension: Secondary | ICD-10-CM | POA: Diagnosis not present

## 2013-06-28 DIAGNOSIS — M549 Dorsalgia, unspecified: Secondary | ICD-10-CM

## 2013-06-28 NOTE — Patient Instructions (Signed)
Start back on Triamterene-HCTZ (Dyazide) once daily. Monitor blood pressure daily and lower extremity swelling.  Call immediately if feeling dizzy or lightheaded. Call if blood pressure less than 100/60.  Follow up 2 weeks or sooner as needed.

## 2013-06-28 NOTE — Assessment & Plan Note (Signed)
Symptoms currently well controlled on Nucynta. Will continue.

## 2013-06-28 NOTE — Progress Notes (Signed)
Subjective:    Patient ID: Cindy Graham, female    DOB: March 26, 1922, 80 y.o.   MRN: [id number]  HPI 80YO female with HTN presents for follow up. Has not been taking Triamterene. BP running low 90-110/50-60s. No lightheadedness or dizziness. No chest pain. Mild dyspnea with exertion has been chronic. Mild chronic LE edema, which does not improve overnight. Unable to get compression hose on, wearing pantyhose.  Feeling well in general. Back pain symptoms have been very well controlled on Nucynta.  Review of Systems  Constitutional: Negative for fever, chills, appetite change, fatigue and unexpected weight change.  HENT: Negative for congestion, ear pain, sinus pressure, sore throat, trouble swallowing and voice change.   Eyes: Negative for visual disturbance.  Respiratory: Positive for shortness of breath (on exertino). Negative for cough, wheezing and stridor.   Cardiovascular: Positive for leg swelling. Negative for chest pain and palpitations.  Gastrointestinal: Negative for nausea, vomiting, abdominal pain, diarrhea, constipation, blood in stool, abdominal distention and anal bleeding.  Genitourinary: Negative for dysuria and flank pain.  Musculoskeletal: Negative for arthralgias, gait problem, myalgias and neck pain.  Skin: Negative for color change and rash.  Neurological: Negative for dizziness and headaches.  Hematological: Negative for adenopathy. Does not bruise/bleed easily.  Psychiatric/Behavioral: Negative for suicidal ideas, sleep disturbance and dysphoric mood. The patient is not nervous/anxious.        Objective:    BP 138/60  Pulse 75  Temp(Src) 98.5 F (36.9 C) (Oral)  Wt 210 lb (95.255 kg)  SpO2 92% Physical Exam  Constitutional: She is oriented to person, place, and time. She appears well-developed and well-nourished. No distress.  HENT:  Head: Normocephalic and atraumatic.  Right Ear: External ear normal.  Left Ear: External ear normal.  Nose: Nose normal.   Mouth/Throat: Oropharynx is clear and moist. No oropharyngeal exudate.  Eyes: Conjunctivae are normal. Pupils are equal, round, and reactive to light. Right eye exhibits no discharge. Left eye exhibits no discharge. No scleral icterus.  Neck: Normal range of motion. Neck supple. No tracheal deviation present. No thyromegaly present.  Cardiovascular: Normal rate, regular rhythm, normal heart sounds and intact distal pulses.  Exam reveals no gallop and no friction rub.   No murmur heard. Pulmonary/Chest: Effort normal. No accessory muscle usage. Not tachypneic. No respiratory distress. She has no decreased breath sounds. She has no wheezes. She has no rhonchi. She has rales (few scattered). She exhibits no tenderness.  Musculoskeletal: Normal range of motion. She exhibits edema (mild pitting lower leg to mid shin). She exhibits no tenderness.  Lymphadenopathy:    She has no cervical adenopathy.  Neurological: She is alert and oriented to person, place, and time. No cranial nerve deficit. She exhibits normal muscle tone. Coordination normal.  Skin: Skin is warm and dry. No rash noted. She is not diaphoretic. No erythema. No pallor.  Psychiatric: She has a normal mood and affect. Her behavior is normal. Judgment and thought content normal.          Assessment & Plan:   Problem List Items Addressed This Visit   Chronic back pain greater than 3 months duration     Symptoms currently well controlled on Nucynta. Will continue.    Chronic kidney disease (CKD), stage III (moderate)     Recent renal function stable. Will repeat renal function next visit after restarting Triamterine-HCTZ.    Hypertension - Primary      BP Readings from Last 3 Encounters:  06/28/13 138/60  06/13/13  142/60  06/04/13 100/50   BP well controlled. Reviewed instructions about starting back on triamterene-HCTZ once daily to help with LE edema. Follow up 2 weeks and prn.     Other Visit Diagnoses   Need for  prophylactic vaccination against Streptococcus pneumoniae (pneumococcus)        Relevant Orders       Pneumococcal conjugate vaccine 13-valent (Completed)        Return in about 2 weeks (around 07/12/2013) for Recheck of Blood Pressure.

## 2013-06-28 NOTE — Assessment & Plan Note (Signed)
Recent renal function stable. Will repeat renal function next visit after restarting Triamterine-HCTZ.

## 2013-06-28 NOTE — Progress Notes (Signed)
Pre-visit discussion using our clinic review tool. No additional management support is needed unless otherwise documented below in the visit note.  

## 2013-06-28 NOTE — Assessment & Plan Note (Signed)
BP Readings from Last 3 Encounters:  06/28/13 138/60  06/13/13 142/60  06/04/13 100/50   BP well controlled. Reviewed instructions about starting back on triamterene-HCTZ once daily to help with LE edema. Follow up 2 weeks and prn.

## 2013-07-01 ENCOUNTER — Telehealth: Payer: Self-pay | Admitting: Internal Medicine

## 2013-07-01 DIAGNOSIS — J45909 Unspecified asthma, uncomplicated: Secondary | ICD-10-CM | POA: Diagnosis not present

## 2013-07-01 DIAGNOSIS — M159 Polyosteoarthritis, unspecified: Secondary | ICD-10-CM | POA: Diagnosis not present

## 2013-07-01 DIAGNOSIS — K219 Gastro-esophageal reflux disease without esophagitis: Secondary | ICD-10-CM | POA: Diagnosis not present

## 2013-07-01 DIAGNOSIS — I509 Heart failure, unspecified: Secondary | ICD-10-CM | POA: Diagnosis not present

## 2013-07-01 DIAGNOSIS — I10 Essential (primary) hypertension: Secondary | ICD-10-CM | POA: Diagnosis not present

## 2013-07-01 DIAGNOSIS — I959 Hypotension, unspecified: Secondary | ICD-10-CM | POA: Diagnosis not present

## 2013-07-01 NOTE — Telephone Encounter (Signed)
Relevant patient education mailed to patient.  

## 2013-07-05 ENCOUNTER — Telehealth: Payer: Self-pay | Admitting: Internal Medicine

## 2013-07-05 NOTE — Telephone Encounter (Signed)
Fwd to Dr. Walker 

## 2013-07-05 NOTE — Telephone Encounter (Signed)
Supposed to stay in touch with Dr. Gilford Rile if she gains or loses 5 pounds within a week.  Has appt Monday.  Pt states she has lost 10 pounds since last Friday.  Has started the new water pill so thinks that may have something to do with it, but wanted to report this to Dr. Gilford Rile.  Will be here for appt Monday.

## 2013-07-08 ENCOUNTER — Ambulatory Visit: Payer: Medicare Other | Admitting: Internal Medicine

## 2013-07-11 DIAGNOSIS — I10 Essential (primary) hypertension: Secondary | ICD-10-CM | POA: Diagnosis not present

## 2013-07-11 DIAGNOSIS — I959 Hypotension, unspecified: Secondary | ICD-10-CM | POA: Diagnosis not present

## 2013-07-11 DIAGNOSIS — M159 Polyosteoarthritis, unspecified: Secondary | ICD-10-CM | POA: Diagnosis not present

## 2013-07-11 DIAGNOSIS — J45909 Unspecified asthma, uncomplicated: Secondary | ICD-10-CM | POA: Diagnosis not present

## 2013-07-11 DIAGNOSIS — K219 Gastro-esophageal reflux disease without esophagitis: Secondary | ICD-10-CM | POA: Diagnosis not present

## 2013-07-11 DIAGNOSIS — I509 Heart failure, unspecified: Secondary | ICD-10-CM | POA: Diagnosis not present

## 2013-07-12 DIAGNOSIS — I10 Essential (primary) hypertension: Secondary | ICD-10-CM | POA: Diagnosis not present

## 2013-07-12 DIAGNOSIS — J45909 Unspecified asthma, uncomplicated: Secondary | ICD-10-CM | POA: Diagnosis not present

## 2013-07-12 DIAGNOSIS — M159 Polyosteoarthritis, unspecified: Secondary | ICD-10-CM | POA: Diagnosis not present

## 2013-07-12 DIAGNOSIS — I959 Hypotension, unspecified: Secondary | ICD-10-CM | POA: Diagnosis not present

## 2013-07-12 DIAGNOSIS — I509 Heart failure, unspecified: Secondary | ICD-10-CM | POA: Diagnosis not present

## 2013-07-12 DIAGNOSIS — K219 Gastro-esophageal reflux disease without esophagitis: Secondary | ICD-10-CM | POA: Diagnosis not present

## 2013-07-16 ENCOUNTER — Telehealth: Payer: Self-pay | Admitting: *Deleted

## 2013-07-16 DIAGNOSIS — I10 Essential (primary) hypertension: Secondary | ICD-10-CM | POA: Diagnosis not present

## 2013-07-16 DIAGNOSIS — K219 Gastro-esophageal reflux disease without esophagitis: Secondary | ICD-10-CM | POA: Diagnosis not present

## 2013-07-16 DIAGNOSIS — M159 Polyosteoarthritis, unspecified: Secondary | ICD-10-CM | POA: Diagnosis not present

## 2013-07-16 DIAGNOSIS — I959 Hypotension, unspecified: Secondary | ICD-10-CM | POA: Diagnosis not present

## 2013-07-16 DIAGNOSIS — Z9981 Dependence on supplemental oxygen: Secondary | ICD-10-CM | POA: Diagnosis not present

## 2013-07-16 DIAGNOSIS — J45909 Unspecified asthma, uncomplicated: Secondary | ICD-10-CM | POA: Diagnosis not present

## 2013-07-16 DIAGNOSIS — I509 Heart failure, unspecified: Secondary | ICD-10-CM | POA: Diagnosis not present

## 2013-07-16 NOTE — Telephone Encounter (Signed)
Linda with [facility] calling with an update. Stated Cindy Graham has had 14 pound weight loss in the last 14 days. BP has been 102/50 HR 62 and when she took it manually it was 140/64 HR 68. Actually she is doing the best she has seen her do in some time. Not having any other symptoms, no nausea or dizziness, urinating as usual

## 2013-07-17 NOTE — Telephone Encounter (Signed)
OK. She was volume overloaded before. As long as feeling well, no changes to medications or plan.

## 2013-07-22 ENCOUNTER — Ambulatory Visit (INDEPENDENT_AMBULATORY_CARE_PROVIDER_SITE_OTHER): Payer: Medicare Other | Admitting: Internal Medicine

## 2013-07-22 ENCOUNTER — Other Ambulatory Visit: Payer: Self-pay | Admitting: Internal Medicine

## 2013-07-22 ENCOUNTER — Telehealth: Payer: Self-pay | Admitting: Internal Medicine

## 2013-07-22 ENCOUNTER — Encounter: Payer: Self-pay | Admitting: Internal Medicine

## 2013-07-22 VITALS — BP 128/68 | HR 68 | Temp 97.9°F | Wt 196.0 lb

## 2013-07-22 DIAGNOSIS — I10 Essential (primary) hypertension: Secondary | ICD-10-CM | POA: Diagnosis not present

## 2013-07-22 NOTE — Telephone Encounter (Signed)
Pt gave paperwork from [place] regarding Flovent to front desk.  Asked to give to Dr. Gilford Rile.  Placed in Dr. Thomes Dinning box.

## 2013-07-22 NOTE — Telephone Encounter (Signed)
In Dr. Walker's folder for completion. 

## 2013-07-22 NOTE — Assessment & Plan Note (Signed)
BP Readings from Last 3 Encounters:  07/22/13 128/68  06/28/13 138/60  06/13/13 142/60   BP well controlled on Triamterene-HCTZ. Will continue. Follow up 3 months and prn.

## 2013-07-22 NOTE — Telephone Encounter (Signed)
Patient is coming in today.

## 2013-07-22 NOTE — Progress Notes (Signed)
   Subjective:    Patient ID: Cindy Graham, female    DOB: 11-12-1921, 80 y.o.   MRN: [id number]  HPI 80YO female with HTN, CKD presents for follow up. Feeling well. Edema in LE and dyspnea have improved after restarting Triamterene-HCTZ. 14lb weight loss noted over 2 weeks. No chest pain, palpitations, dizziness. No new concerns today.  Review of Systems  Constitutional: Negative for fever, chills, appetite change, fatigue and unexpected weight change.  HENT: Negative for congestion, ear pain, sinus pressure, sore throat, trouble swallowing and voice change.   Eyes: Negative for visual disturbance.  Respiratory: Positive for shortness of breath (chronic, with exertion only). Negative for cough, wheezing and stridor.   Cardiovascular: Negative for chest pain, palpitations and leg swelling.  Gastrointestinal: Negative for nausea, vomiting, abdominal pain, diarrhea, constipation, blood in stool, abdominal distention and anal bleeding.  Genitourinary: Negative for dysuria and flank pain.  Musculoskeletal: Positive for arthralgias and back pain. Negative for gait problem, myalgias and neck pain.  Skin: Negative for color change and rash.  Neurological: Negative for dizziness and headaches.  Hematological: Negative for adenopathy. Does not bruise/bleed easily.  Psychiatric/Behavioral: Negative for suicidal ideas, sleep disturbance and dysphoric mood. The patient is not nervous/anxious.        Objective:    BP 128/68  Pulse 68  Temp(Src) 97.9 F (36.6 C) (Oral)  Wt 196 lb (88.905 kg)  SpO2 95% Physical Exam  Constitutional: She is oriented to person, place, and time. She appears well-developed and well-nourished. No distress.  HENT:  Head: Normocephalic and atraumatic.  Right Ear: External ear normal.  Left Ear: External ear normal.  Nose: Nose normal.  Mouth/Throat: Oropharynx is clear and moist. No oropharyngeal exudate.  Eyes: Conjunctivae are normal. Pupils are equal, round,  and reactive to light. Right eye exhibits no discharge. Left eye exhibits no discharge. No scleral icterus.  Neck: Normal range of motion. Neck supple. No tracheal deviation present. No thyromegaly present.  Cardiovascular: Normal rate, regular rhythm, normal heart sounds and intact distal pulses.  Exam reveals no gallop and no friction rub.   No murmur heard. Pulmonary/Chest: Effort normal and breath sounds normal. No accessory muscle usage. Not tachypneic. No respiratory distress. She has no decreased breath sounds. She has no wheezes. She has no rhonchi. She has no rales. She exhibits no tenderness.  Musculoskeletal: Normal range of motion. She exhibits no edema and no tenderness.  Lymphadenopathy:    She has no cervical adenopathy.  Neurological: She is alert and oriented to person, place, and time. No cranial nerve deficit. She exhibits normal muscle tone. Coordination normal.  Skin: Skin is warm and dry. No rash noted. She is not diaphoretic. No erythema. No pallor.  Psychiatric: She has a normal mood and affect. Her behavior is normal. Judgment and thought content normal.          Assessment & Plan:   Problem List Items Addressed This Visit   Hypertension - Primary      BP Readings from Last 3 Encounters:  07/22/13 128/68  06/28/13 138/60  06/13/13 142/60   BP well controlled on Triamterene-HCTZ. Will continue. Follow up 3 months and prn.        Return in about 3 months (around 10/22/2013) for Wellness Visit.

## 2013-07-22 NOTE — Progress Notes (Signed)
Pre visit review using our clinic review tool, if applicable. No additional management support is needed unless otherwise documented below in the visit note. 

## 2013-07-23 ENCOUNTER — Telehealth: Payer: Self-pay | Admitting: Internal Medicine

## 2013-07-23 ENCOUNTER — Telehealth: Payer: Self-pay | Admitting: *Deleted

## 2013-07-23 DIAGNOSIS — K219 Gastro-esophageal reflux disease without esophagitis: Secondary | ICD-10-CM | POA: Diagnosis not present

## 2013-07-23 DIAGNOSIS — J45909 Unspecified asthma, uncomplicated: Secondary | ICD-10-CM | POA: Diagnosis not present

## 2013-07-23 DIAGNOSIS — I959 Hypotension, unspecified: Secondary | ICD-10-CM | POA: Diagnosis not present

## 2013-07-23 DIAGNOSIS — I10 Essential (primary) hypertension: Secondary | ICD-10-CM | POA: Diagnosis not present

## 2013-07-23 DIAGNOSIS — I509 Heart failure, unspecified: Secondary | ICD-10-CM | POA: Diagnosis not present

## 2013-07-23 DIAGNOSIS — M159 Polyosteoarthritis, unspecified: Secondary | ICD-10-CM | POA: Diagnosis not present

## 2013-07-23 NOTE — Telephone Encounter (Signed)
Relevant patient education mailed to patient.  

## 2013-07-25 DIAGNOSIS — Z0279 Encounter for issue of other medical certificate: Secondary | ICD-10-CM

## 2013-07-30 DIAGNOSIS — J45909 Unspecified asthma, uncomplicated: Secondary | ICD-10-CM | POA: Diagnosis not present

## 2013-07-30 DIAGNOSIS — I509 Heart failure, unspecified: Secondary | ICD-10-CM | POA: Diagnosis not present

## 2013-07-30 DIAGNOSIS — M159 Polyosteoarthritis, unspecified: Secondary | ICD-10-CM | POA: Diagnosis not present

## 2013-07-30 DIAGNOSIS — I959 Hypotension, unspecified: Secondary | ICD-10-CM | POA: Diagnosis not present

## 2013-07-30 DIAGNOSIS — K219 Gastro-esophageal reflux disease without esophagitis: Secondary | ICD-10-CM | POA: Diagnosis not present

## 2013-07-30 DIAGNOSIS — I10 Essential (primary) hypertension: Secondary | ICD-10-CM | POA: Diagnosis not present

## 2013-08-07 DIAGNOSIS — K219 Gastro-esophageal reflux disease without esophagitis: Secondary | ICD-10-CM | POA: Diagnosis not present

## 2013-08-07 DIAGNOSIS — I10 Essential (primary) hypertension: Secondary | ICD-10-CM | POA: Diagnosis not present

## 2013-08-07 DIAGNOSIS — I509 Heart failure, unspecified: Secondary | ICD-10-CM | POA: Diagnosis not present

## 2013-08-07 DIAGNOSIS — M159 Polyosteoarthritis, unspecified: Secondary | ICD-10-CM | POA: Diagnosis not present

## 2013-08-07 DIAGNOSIS — J45909 Unspecified asthma, uncomplicated: Secondary | ICD-10-CM | POA: Diagnosis not present

## 2013-08-07 DIAGNOSIS — I959 Hypotension, unspecified: Secondary | ICD-10-CM | POA: Diagnosis not present

## 2013-08-13 ENCOUNTER — Telehealth: Payer: Self-pay | Admitting: *Deleted

## 2013-08-13 DIAGNOSIS — I509 Heart failure, unspecified: Secondary | ICD-10-CM | POA: Diagnosis not present

## 2013-08-13 DIAGNOSIS — K219 Gastro-esophageal reflux disease without esophagitis: Secondary | ICD-10-CM | POA: Diagnosis not present

## 2013-08-13 DIAGNOSIS — M159 Polyosteoarthritis, unspecified: Secondary | ICD-10-CM | POA: Diagnosis not present

## 2013-08-13 DIAGNOSIS — J45909 Unspecified asthma, uncomplicated: Secondary | ICD-10-CM | POA: Diagnosis not present

## 2013-08-13 DIAGNOSIS — I10 Essential (primary) hypertension: Secondary | ICD-10-CM | POA: Diagnosis not present

## 2013-08-13 DIAGNOSIS — I959 Hypotension, unspecified: Secondary | ICD-10-CM | POA: Diagnosis not present

## 2013-08-13 NOTE — Telephone Encounter (Signed)
Yes, let's hold the Triamterene/HCTZ and plan to recheck BP tomorrow. If any symptoms of dizziness, fatigue, chest pain, shortness of breath then needs to be seen.

## 2013-08-13 NOTE — Telephone Encounter (Signed)
Linda with [facility] called stating patient blood pressure was 99/47 when the patient took it this morning. She compared it with her cuff manually and it was pretty close to that, BP now is 112/54. Has not taken her Triam/HCTZ, nurse [name] told her to hold off on taking it today unless you tell her otherwise. Please advise.

## 2013-08-13 NOTE — Telephone Encounter (Signed)
Informed nurse [name] from [facility] of Dr. Gilford Rile instructions, she will inform the patient and go back out to check on her tomorrow.

## 2013-08-14 DIAGNOSIS — K219 Gastro-esophageal reflux disease without esophagitis: Secondary | ICD-10-CM | POA: Diagnosis not present

## 2013-08-14 DIAGNOSIS — M159 Polyosteoarthritis, unspecified: Secondary | ICD-10-CM | POA: Diagnosis not present

## 2013-08-14 DIAGNOSIS — I509 Heart failure, unspecified: Secondary | ICD-10-CM | POA: Diagnosis not present

## 2013-08-14 DIAGNOSIS — J45909 Unspecified asthma, uncomplicated: Secondary | ICD-10-CM | POA: Diagnosis not present

## 2013-08-14 DIAGNOSIS — I959 Hypotension, unspecified: Secondary | ICD-10-CM | POA: Diagnosis not present

## 2013-08-14 DIAGNOSIS — I10 Essential (primary) hypertension: Secondary | ICD-10-CM | POA: Diagnosis not present

## 2013-08-15 ENCOUNTER — Telehealth: Payer: Self-pay | Admitting: *Deleted

## 2013-08-15 NOTE — Telephone Encounter (Signed)
OK. Continue off Triam/HCTZ.  Enalapril should be only once daily

## 2013-08-15 NOTE — Telephone Encounter (Signed)
Linda with [facility] left voicemail message stating the patient did not take her Triam/HCTZ today either and her BP was 114/58 this morning and 132/64 when she took it. Somehow the patient has only been taking her Enalapril once a day and the instructions state it was to be taken BID. Stated patient insist she has always taken it that, please advise if this is an appropriate change.

## 2013-08-16 NOTE — Telephone Encounter (Signed)
Called and spoke with patient, informed her of Dr. Thomes Dinning instructions and patient verbally agreed understanding.

## 2013-08-19 DIAGNOSIS — I959 Hypotension, unspecified: Secondary | ICD-10-CM | POA: Diagnosis not present

## 2013-08-19 DIAGNOSIS — I509 Heart failure, unspecified: Secondary | ICD-10-CM | POA: Diagnosis not present

## 2013-08-19 DIAGNOSIS — K219 Gastro-esophageal reflux disease without esophagitis: Secondary | ICD-10-CM | POA: Diagnosis not present

## 2013-08-19 DIAGNOSIS — J45909 Unspecified asthma, uncomplicated: Secondary | ICD-10-CM | POA: Diagnosis not present

## 2013-08-19 DIAGNOSIS — M159 Polyosteoarthritis, unspecified: Secondary | ICD-10-CM | POA: Diagnosis not present

## 2013-08-19 DIAGNOSIS — I10 Essential (primary) hypertension: Secondary | ICD-10-CM | POA: Diagnosis not present

## 2013-08-26 ENCOUNTER — Other Ambulatory Visit: Payer: Self-pay | Admitting: Internal Medicine

## 2013-08-26 ENCOUNTER — Telehealth: Payer: Self-pay | Admitting: *Deleted

## 2013-08-26 DIAGNOSIS — I10 Essential (primary) hypertension: Secondary | ICD-10-CM | POA: Diagnosis not present

## 2013-08-26 DIAGNOSIS — I959 Hypotension, unspecified: Secondary | ICD-10-CM | POA: Diagnosis not present

## 2013-08-26 DIAGNOSIS — I509 Heart failure, unspecified: Secondary | ICD-10-CM | POA: Diagnosis not present

## 2013-08-26 DIAGNOSIS — M159 Polyosteoarthritis, unspecified: Secondary | ICD-10-CM | POA: Diagnosis not present

## 2013-08-26 DIAGNOSIS — J45909 Unspecified asthma, uncomplicated: Secondary | ICD-10-CM | POA: Diagnosis not present

## 2013-08-26 DIAGNOSIS — K219 Gastro-esophageal reflux disease without esophagitis: Secondary | ICD-10-CM | POA: Diagnosis not present

## 2013-08-26 NOTE — Telephone Encounter (Signed)
Cindy Graham, the nurse with [facility] left a message stating when she went out to see patient today. Patient has put on 6 pounds since last week, she has stopped taking the diuretic Triam/HCTZ because it was making her BP drop too low. Definitely has more edema in her legs and usually it is not in her legs but mostly in her ankles. Pretty sure this is from fluid build up. 2+ pit edema in her legs, they measure her legs at their visits and it has gone from 21 to 23. Last week her lungs were clear and today she could her rales in right left lower lung. Patient is doing ok but this needs to handle

## 2013-08-26 NOTE — Telephone Encounter (Signed)
Have her start back taking Triamterene-HCTZ once daily and have nurse reassess Thursday.

## 2013-08-26 NOTE — Telephone Encounter (Signed)
OK to fill

## 2013-08-27 DIAGNOSIS — I10 Essential (primary) hypertension: Secondary | ICD-10-CM | POA: Diagnosis not present

## 2013-08-27 DIAGNOSIS — J45909 Unspecified asthma, uncomplicated: Secondary | ICD-10-CM | POA: Diagnosis not present

## 2013-08-27 DIAGNOSIS — K219 Gastro-esophageal reflux disease without esophagitis: Secondary | ICD-10-CM | POA: Diagnosis not present

## 2013-08-27 DIAGNOSIS — I509 Heart failure, unspecified: Secondary | ICD-10-CM | POA: Diagnosis not present

## 2013-08-27 DIAGNOSIS — M159 Polyosteoarthritis, unspecified: Secondary | ICD-10-CM | POA: Diagnosis not present

## 2013-08-27 DIAGNOSIS — I959 Hypotension, unspecified: Secondary | ICD-10-CM | POA: Diagnosis not present

## 2013-08-27 NOTE — Telephone Encounter (Signed)
Informed patient to take 1 capsule of 37.5-25 and do not take the 75 mg tablet. Patient verbalized understanding as well as nurse [name]

## 2013-08-27 NOTE — Telephone Encounter (Signed)
Called and informed [name] of Dr. Thomes Dinning instructions, she will do. Tried to call patient to give her update, no answer or voicemail to leave message.

## 2013-08-27 NOTE — Telephone Encounter (Signed)
Triamterene-HCTZ 37.5-25mg  daily

## 2013-08-27 NOTE — Telephone Encounter (Signed)
She has been on two different strengths of Triam/HCTZ, which should she go back to taking daily.

## 2013-08-28 NOTE — Telephone Encounter (Signed)
error 

## 2013-08-29 ENCOUNTER — Telehealth: Payer: Self-pay | Admitting: *Deleted

## 2013-08-29 DIAGNOSIS — M159 Polyosteoarthritis, unspecified: Secondary | ICD-10-CM | POA: Diagnosis not present

## 2013-08-29 DIAGNOSIS — I509 Heart failure, unspecified: Secondary | ICD-10-CM | POA: Diagnosis not present

## 2013-08-29 DIAGNOSIS — J45909 Unspecified asthma, uncomplicated: Secondary | ICD-10-CM | POA: Diagnosis not present

## 2013-08-29 DIAGNOSIS — I10 Essential (primary) hypertension: Secondary | ICD-10-CM | POA: Diagnosis not present

## 2013-08-29 DIAGNOSIS — K219 Gastro-esophageal reflux disease without esophagitis: Secondary | ICD-10-CM | POA: Diagnosis not present

## 2013-08-29 DIAGNOSIS — I959 Hypotension, unspecified: Secondary | ICD-10-CM | POA: Diagnosis not present

## 2013-08-29 NOTE — Telephone Encounter (Signed)
She needs to be seen and evaluated in a visit.

## 2013-08-29 NOTE — Telephone Encounter (Signed)
Due to working with a provider and trying to get phone messages from both phones this message is late being pulled off the voicemail. She has only been back on the Triamterene/HCTZ for 2 days and no change in the swelling. She still has about 2+ edema and her O2 remained between 93-95%, after walking about 50 feet around the house it dropped to 90% but once she rested it got back up to 93%. Thinks this is mostly fluid retention and still hear something in the right lower lobe of lungs. She does have Oxygen at home but only uses it at night. Do you think a mobile X-ray need to be ordered.

## 2013-08-30 NOTE — Telephone Encounter (Signed)
Patient can not come on Monday, she has a dentist appointment. Appointment confirmed for Tuesday with Raquel

## 2013-09-03 ENCOUNTER — Encounter: Payer: Self-pay | Admitting: Adult Health

## 2013-09-03 ENCOUNTER — Ambulatory Visit (INDEPENDENT_AMBULATORY_CARE_PROVIDER_SITE_OTHER): Payer: Medicare Other | Admitting: Adult Health

## 2013-09-03 VITALS — BP 140/60 | HR 66 | Temp 98.3°F | Resp 14 | Wt 196.0 lb

## 2013-09-03 DIAGNOSIS — R609 Edema, unspecified: Secondary | ICD-10-CM | POA: Diagnosis not present

## 2013-09-03 DIAGNOSIS — I10 Essential (primary) hypertension: Secondary | ICD-10-CM

## 2013-09-03 DIAGNOSIS — R600 Localized edema: Secondary | ICD-10-CM

## 2013-09-03 LAB — BASIC METABOLIC PANEL
BUN: 25 mg/dL — ABNORMAL HIGH (ref 6–23)
CHLORIDE: 102 meq/L (ref 96–112)
CO2: 28 meq/L (ref 19–32)
CREATININE: 1.2 mg/dL (ref 0.4–1.2)
Calcium: 9.2 mg/dL (ref 8.4–10.5)
GFR: 45.29 mL/min — ABNORMAL LOW (ref >60.00)
Glucose, Bld: 84 mg/dL (ref 70–99)
Potassium: 4.5 mEq/L (ref 3.5–5.1)
Sodium: 140 mEq/L (ref 135–145)

## 2013-09-03 MED ORDER — TRIAMTERENE-HCTZ 37.5-25 MG PO CAPS: 1.0000 | ORAL_CAPSULE | Freq: Every day | ORAL | Status: DC

## 2013-09-03 NOTE — Progress Notes (Signed)
Subjective:    Patient ID: Cindy Graham, female    DOB: [birth year], 80 y.o.   MRN: [id number]  HPI Patient is a pleasant 80 year old female who presents to clinic to be evaluated for bilateral lower extremity edema. Patient reports that both of her legs swell; however, this does not occur all the time. Reports that when she first wakes up her legs are usually not swollen. Swelling occurs mostly at the end of the day. She does not wear any compression hose. Apparently, according to the home health nurse (per notes on 4/6 & 08/29/13), patient had not been taking her triamterene/HCTZ secondary to her blood pressure dropping too low. Patient, however, states that she was taking her medication as ordered by Dr. Gilford Rile. I tend to agree with the home health nurse of the possibility that the patient was not taking her medication as she is having some trouble keeping all her medications straight while talking to me about them. She denies any shortness of breath at rest. She does get slightly "winded" with activity. Quickly returns to baseline once she rests.   Past Medical History  Diagnosis Date  . Hypertension   . Hyperlipidemia   . Hernia   . Urine incontinence   . COPD (chronic obstructive pulmonary disease)   . Arthritis     osteo  . GERD (gastroesophageal reflux disease)   . Heart murmur   . Syncope and collapse   . Melanoma     face     Current Outpatient Prescriptions on File Prior to Visit  Medication Sig Dispense Refill  . aspirin 81 MG tablet Take 81 mg by mouth daily.        Marland Kitchen atenolol (TENORMIN) 25 MG tablet TAKE ONE (1) TABLET BY MOUTH EVERY      DAY  90 tablet  1  . Biotin 1000 MCG tablet Take 1,000 mcg by mouth daily.        . cholecalciferol (VITAMIN D) 400 UNITS TABS Take 400 Units by mouth 2 (two) times daily.        . enalapril (VASOTEC) 20 MG tablet Take 1 tablet by mouth Twice daily.      Marland Kitchen estrogens, conjugated, (PREMARIN) 0.3 MG tablet Take 1 tablet (0.3 mg total) by  mouth daily.  30 tablet  5  . Flaxseed, Linseed, (FLAX SEED OIL) 1000 MG CAPS Take 1 capsule by mouth daily.        . fluticasone (FLOVENT HFA) 110 MCG/ACT inhaler Inhale 2 puffs into the lungs daily.  1 Inhaler  5  . gabapentin (NEURONTIN) 300 MG capsule TAKE ONE CAPSULE BY MOUTH DAILY  90 capsule  6  . lidocaine (LIDODERM) 5 % Place 1 patch onto the skin every 12 (twelve) hours. Remove & Discard patch within 12 hours or as directed by MD  30 patch  3  . Multiple Vitamin (MULTIVITAMIN) capsule Take 1 capsule by mouth daily.        . nitroGLYCERIN (NITROSTAT) 0.3 MG SL tablet Place 1 tablet (0.3 mg total) under the tongue every 5 (five) minutes as needed.  90 tablet  0  . NUCYNTA 50 MG TABS tablet Take 50 mg by mouth 2 (two) times daily as needed (Take 1 tablet twice a day as needed).       . nystatin (MYCOSTATIN/NYSTOP) 100000 UNIT/GM POWD Apply 1 application topically BID times 48H.  30 g  1  . omeprazole (PRILOSEC) 20 MG capsule Take 1 capsule (20 mg total) by  mouth daily.  90 capsule  1  . oxybutynin (DITROPAN) 5 MG tablet Take 1 tablet (5 mg total) by mouth daily.  30 tablet  5  . sertraline (ZOLOFT) 100 MG tablet TAKE ONE (1) TABLET EACH DAY  30 tablet  6  . verapamil (CALAN-SR) 180 MG CR tablet TAKE ONE TABLET BY MOUTH EVERY NIGHT AT BEDTIME  90 tablet  1  . [DISCONTINUED] oxybutynin (DITROPAN) 5 MG tablet Take 1 tablet by mouth Daily.       No current facility-administered medications on file prior to visit.    Review of Systems  Constitutional: Negative for fatigue.       Reports feeling very well  HENT: Positive for dental problem (appointment tomorrow to have lower teeth pulled).   Respiratory: Positive for shortness of breath (none at rest. SOB with exertion. Resolves quickly once rests.). Negative for cough and chest tightness.   Cardiovascular: Positive for leg swelling. Negative for chest pain and palpitations.  Gastrointestinal: Negative.   Genitourinary: Negative.     Musculoskeletal: Negative.   Neurological: Negative for dizziness, syncope, weakness and numbness.  Psychiatric/Behavioral: Negative.   All other systems reviewed and are negative.      Objective:   Physical Exam  Constitutional: She is oriented to person, place, and time.  80 y/o female appears younger than stated age, NAD  HENT:  Head: Normocephalic and atraumatic.  Cardiovascular: Normal rate, regular rhythm and normal heart sounds.  Exam reveals no gallop and no friction rub.   No murmur heard. Pulmonary/Chest: Effort normal and breath sounds normal. No respiratory distress. She has no wheezes. She has no rales.  Good air movement throughout all lung fields. No rales audible during exam. No respiratory distress. No sob. Sats 94% RA.  Musculoskeletal: She exhibits edema (trace edema bil LE).  Ambulates with walker. Difficulty getting from sitting to standing position but once she is standing she does very well. Gait steady. Walker for added stability.  Neurological: She is alert and oriented to person, place, and time. Coordination normal.  Skin: Skin is warm and dry.  Psychiatric: She has a normal mood and affect. Her behavior is normal. Judgment and thought content normal.      Assessment & Plan:   1. Hypertension Check bmet. Refills sent for Dyazide.  - Basic metabolic panel - triamterene-hydrochlorothiazide (DYAZIDE) 37.5-25 MG per capsule; Take 1 each (1 capsule total) by mouth daily.  Dispense: 30 capsule; Refill: 5  2. Edema of both legs Suspect multifactorial - was not taking medication, sodium intake and venous insufficiency. Discussed taking medication daily in the morning. Patient agreed that she would take medication exactly as prescribed. She uses some salt in food preparation. I have asked patient not to add additional table salt to her foods. This causes greater fluid retention. We also discussed using compression socks (not necessarily the prescription grade) that  can be purchased at [employer]. Instructed patient to apply the socks after her shower and remove them before bed. Elevate both legs as much as possible. Reports any shortness of breath that is not alleviated with rest immediately. Breath sounds are clear currently. Minimal edema during clinic visit. She has an appointment with Dr. Gilford Rile on 10/29/13 for her CPE. Instructed to call if any symptoms prior to this visit.

## 2013-09-03 NOTE — Progress Notes (Signed)
Pre visit review using our clinic review tool, if applicable. No additional management support is needed unless otherwise documented below in the visit note. 

## 2013-09-03 NOTE — Patient Instructions (Signed)
  Your lung sounds were clear. I did not hear any fluid in your lungs.  Continue the triamterine/HCTZ 37.5 mg/25 mg daily as you have been doing.  Wear compression socks. [employer] carries some that are not the medical grade but that have some compression to help with the swelling. Put them on after your shower and remove at bedtime.  Elevate your legs whenever you are sitting.  Avoid sodium. If you prepare your food with some salt do not add any table salt.  Call if you become short of breath or if the swelling does not improve.  Please have your blood work drawn prior to leaving the office. We will contact you with the results once available. I am checking your kidney and potassium as well as other electrolytes.

## 2013-09-05 DIAGNOSIS — K219 Gastro-esophageal reflux disease without esophagitis: Secondary | ICD-10-CM | POA: Diagnosis not present

## 2013-09-05 DIAGNOSIS — I959 Hypotension, unspecified: Secondary | ICD-10-CM | POA: Diagnosis not present

## 2013-09-05 DIAGNOSIS — I10 Essential (primary) hypertension: Secondary | ICD-10-CM | POA: Diagnosis not present

## 2013-09-05 DIAGNOSIS — M159 Polyosteoarthritis, unspecified: Secondary | ICD-10-CM | POA: Diagnosis not present

## 2013-09-05 DIAGNOSIS — J45909 Unspecified asthma, uncomplicated: Secondary | ICD-10-CM | POA: Diagnosis not present

## 2013-09-05 DIAGNOSIS — I509 Heart failure, unspecified: Secondary | ICD-10-CM | POA: Diagnosis not present

## 2013-09-10 DIAGNOSIS — J45909 Unspecified asthma, uncomplicated: Secondary | ICD-10-CM | POA: Diagnosis not present

## 2013-09-10 DIAGNOSIS — M159 Polyosteoarthritis, unspecified: Secondary | ICD-10-CM | POA: Diagnosis not present

## 2013-09-10 DIAGNOSIS — K219 Gastro-esophageal reflux disease without esophagitis: Secondary | ICD-10-CM | POA: Diagnosis not present

## 2013-09-10 DIAGNOSIS — I959 Hypotension, unspecified: Secondary | ICD-10-CM | POA: Diagnosis not present

## 2013-09-10 DIAGNOSIS — I10 Essential (primary) hypertension: Secondary | ICD-10-CM | POA: Diagnosis not present

## 2013-09-10 DIAGNOSIS — I509 Heart failure, unspecified: Secondary | ICD-10-CM | POA: Diagnosis not present

## 2013-09-14 DIAGNOSIS — Z87440 Personal history of urinary (tract) infections: Secondary | ICD-10-CM | POA: Diagnosis not present

## 2013-09-14 DIAGNOSIS — M159 Polyosteoarthritis, unspecified: Secondary | ICD-10-CM | POA: Diagnosis not present

## 2013-09-14 DIAGNOSIS — Z9981 Dependence on supplemental oxygen: Secondary | ICD-10-CM | POA: Diagnosis not present

## 2013-09-14 DIAGNOSIS — K219 Gastro-esophageal reflux disease without esophagitis: Secondary | ICD-10-CM | POA: Diagnosis not present

## 2013-09-14 DIAGNOSIS — I959 Hypotension, unspecified: Secondary | ICD-10-CM | POA: Diagnosis not present

## 2013-09-14 DIAGNOSIS — J45909 Unspecified asthma, uncomplicated: Secondary | ICD-10-CM | POA: Diagnosis not present

## 2013-09-14 DIAGNOSIS — I10 Essential (primary) hypertension: Secondary | ICD-10-CM | POA: Diagnosis not present

## 2013-09-14 DIAGNOSIS — I509 Heart failure, unspecified: Secondary | ICD-10-CM | POA: Diagnosis not present

## 2013-09-17 DIAGNOSIS — J45909 Unspecified asthma, uncomplicated: Secondary | ICD-10-CM | POA: Diagnosis not present

## 2013-09-17 DIAGNOSIS — I509 Heart failure, unspecified: Secondary | ICD-10-CM | POA: Diagnosis not present

## 2013-09-17 DIAGNOSIS — I959 Hypotension, unspecified: Secondary | ICD-10-CM | POA: Diagnosis not present

## 2013-09-17 DIAGNOSIS — Z87440 Personal history of urinary (tract) infections: Secondary | ICD-10-CM | POA: Diagnosis not present

## 2013-09-17 DIAGNOSIS — I10 Essential (primary) hypertension: Secondary | ICD-10-CM | POA: Diagnosis not present

## 2013-09-17 DIAGNOSIS — M159 Polyosteoarthritis, unspecified: Secondary | ICD-10-CM | POA: Diagnosis not present

## 2013-09-19 DIAGNOSIS — K083 Retained dental root: Secondary | ICD-10-CM | POA: Diagnosis not present

## 2013-09-19 DIAGNOSIS — S025XXA Fracture of tooth (traumatic), initial encounter for closed fracture: Secondary | ICD-10-CM | POA: Diagnosis not present

## 2013-09-19 DIAGNOSIS — K029 Dental caries, unspecified: Secondary | ICD-10-CM | POA: Diagnosis not present

## 2013-09-19 DIAGNOSIS — M278 Other specified diseases of jaws: Secondary | ICD-10-CM | POA: Diagnosis not present

## 2013-09-20 DIAGNOSIS — I10 Essential (primary) hypertension: Secondary | ICD-10-CM | POA: Diagnosis not present

## 2013-09-20 DIAGNOSIS — I959 Hypotension, unspecified: Secondary | ICD-10-CM | POA: Diagnosis not present

## 2013-09-20 DIAGNOSIS — I509 Heart failure, unspecified: Secondary | ICD-10-CM | POA: Diagnosis not present

## 2013-09-20 DIAGNOSIS — J45909 Unspecified asthma, uncomplicated: Secondary | ICD-10-CM | POA: Diagnosis not present

## 2013-09-20 DIAGNOSIS — Z87440 Personal history of urinary (tract) infections: Secondary | ICD-10-CM | POA: Diagnosis not present

## 2013-09-20 DIAGNOSIS — M159 Polyosteoarthritis, unspecified: Secondary | ICD-10-CM | POA: Diagnosis not present

## 2013-09-25 ENCOUNTER — Telehealth: Payer: Self-pay | Admitting: *Deleted

## 2013-09-25 DIAGNOSIS — J45909 Unspecified asthma, uncomplicated: Secondary | ICD-10-CM | POA: Diagnosis not present

## 2013-09-25 DIAGNOSIS — I10 Essential (primary) hypertension: Secondary | ICD-10-CM | POA: Diagnosis not present

## 2013-09-25 DIAGNOSIS — I959 Hypotension, unspecified: Secondary | ICD-10-CM | POA: Diagnosis not present

## 2013-09-25 DIAGNOSIS — M159 Polyosteoarthritis, unspecified: Secondary | ICD-10-CM

## 2013-09-25 DIAGNOSIS — I509 Heart failure, unspecified: Secondary | ICD-10-CM | POA: Diagnosis not present

## 2013-09-25 DIAGNOSIS — Z87440 Personal history of urinary (tract) infections: Secondary | ICD-10-CM | POA: Diagnosis not present

## 2013-09-25 NOTE — Telephone Encounter (Signed)
Received Home Health plan of treatment form, completed and faxed back to [place]

## 2013-09-27 ENCOUNTER — Other Ambulatory Visit: Payer: Self-pay | Admitting: Internal Medicine

## 2013-09-30 ENCOUNTER — Other Ambulatory Visit: Payer: Self-pay | Admitting: Internal Medicine

## 2013-10-02 ENCOUNTER — Other Ambulatory Visit: Payer: Self-pay | Admitting: Internal Medicine

## 2013-10-03 DIAGNOSIS — M159 Polyosteoarthritis, unspecified: Secondary | ICD-10-CM | POA: Diagnosis not present

## 2013-10-03 DIAGNOSIS — I10 Essential (primary) hypertension: Secondary | ICD-10-CM | POA: Diagnosis not present

## 2013-10-03 DIAGNOSIS — J45909 Unspecified asthma, uncomplicated: Secondary | ICD-10-CM | POA: Diagnosis not present

## 2013-10-03 DIAGNOSIS — Z87440 Personal history of urinary (tract) infections: Secondary | ICD-10-CM | POA: Diagnosis not present

## 2013-10-03 DIAGNOSIS — I509 Heart failure, unspecified: Secondary | ICD-10-CM | POA: Diagnosis not present

## 2013-10-03 DIAGNOSIS — I959 Hypotension, unspecified: Secondary | ICD-10-CM | POA: Diagnosis not present

## 2013-10-07 ENCOUNTER — Other Ambulatory Visit: Payer: Self-pay | Admitting: Internal Medicine

## 2013-10-07 ENCOUNTER — Telehealth: Payer: Self-pay | Admitting: *Deleted

## 2013-10-07 DIAGNOSIS — Z87440 Personal history of urinary (tract) infections: Secondary | ICD-10-CM | POA: Diagnosis not present

## 2013-10-07 DIAGNOSIS — I509 Heart failure, unspecified: Secondary | ICD-10-CM | POA: Diagnosis not present

## 2013-10-07 DIAGNOSIS — I959 Hypotension, unspecified: Secondary | ICD-10-CM | POA: Diagnosis not present

## 2013-10-07 DIAGNOSIS — M159 Polyosteoarthritis, unspecified: Secondary | ICD-10-CM | POA: Diagnosis not present

## 2013-10-07 DIAGNOSIS — J45909 Unspecified asthma, uncomplicated: Secondary | ICD-10-CM | POA: Diagnosis not present

## 2013-10-07 DIAGNOSIS — I10 Essential (primary) hypertension: Secondary | ICD-10-CM | POA: Diagnosis not present

## 2013-10-07 NOTE — Telephone Encounter (Signed)
[facility] nurse, [name], called stating that she feels that the pt is unusually confused, she states that she finds that a lot of times this comes from a UTI without symptoms.  [name] states that last time pt had a UTI she had no symptoms, except confusion.  [name] offered to take a urine sample to [facility] for testing.  Renato Gails that was not necessary, we would call the pt and try to have her make an appt.

## 2013-10-08 ENCOUNTER — Encounter: Payer: Self-pay | Admitting: Internal Medicine

## 2013-10-08 ENCOUNTER — Ambulatory Visit (INDEPENDENT_AMBULATORY_CARE_PROVIDER_SITE_OTHER): Payer: Medicare Other | Admitting: Internal Medicine

## 2013-10-08 VITALS — BP 120/58 | HR 64 | Wt 188.0 lb

## 2013-10-08 DIAGNOSIS — I10 Essential (primary) hypertension: Secondary | ICD-10-CM

## 2013-10-08 DIAGNOSIS — R300 Dysuria: Secondary | ICD-10-CM | POA: Diagnosis not present

## 2013-10-08 DIAGNOSIS — N390 Urinary tract infection, site not specified: Secondary | ICD-10-CM

## 2013-10-08 LAB — POCT URINALYSIS DIPSTICK
Bilirubin, UA: NEGATIVE
GLUCOSE UA: NEGATIVE
KETONES UA: NEGATIVE
Nitrite, UA: POSITIVE
PROTEIN UA: NEGATIVE
SPEC GRAV UA: 1.02
Urobilinogen, UA: 0.2
pH, UA: 6

## 2013-10-08 MED ORDER — CIPROFLOXACIN HCL 250 MG PO TABS: 250.0000 mg | ORAL_TABLET | Freq: Two times a day (BID) | ORAL | Status: DC

## 2013-10-08 NOTE — Assessment & Plan Note (Signed)
Urinalysis c/w UTI. Will send urine for culture. Will start empiric Cipro. Follow up prn fever, chills, dysuria, or other concerns.Cindy Graham

## 2013-10-08 NOTE — Assessment & Plan Note (Signed)
BP Readings from Last 3 Encounters:  10/08/13 120/58  09/03/13 140/60  07/22/13 128/68   BP well controlled on Triamterine-HCTZ. Will continue. Follow up 3 months and prn.

## 2013-10-08 NOTE — Progress Notes (Signed)
   Subjective:    Patient ID: Cindy Graham, female    DOB: 12-21-1921, 80 y.o.   MRN: [id number]  HPI 80YO female presents for acute visit. Home health nurse was concerned that she was more confused, concerned about possible UTI. No fever, chills, dysuria. Pt denies confusion. Thinks that this may have been misperception because of her issues with dentures, and trouble talking.  BP has been well controlled with recent dosing of Triamterine-HCTZ. No recurrent episodes of lightheadedness. No leg swelling.    Review of Systems  Constitutional: Negative for fever, chills and fatigue.  Gastrointestinal: Negative for nausea, vomiting, abdominal pain, diarrhea, constipation and rectal pain.  Genitourinary: Negative for dysuria, urgency, frequency, hematuria, flank pain, decreased urine volume, vaginal bleeding, vaginal discharge, difficulty urinating, vaginal pain and pelvic pain.  Psychiatric/Behavioral: Negative for behavioral problems and confusion.       Objective:    BP 120/58  Pulse 64  Wt 188 lb (85.276 kg)  SpO2 94% Physical Exam  Constitutional: She is oriented to person, place, and time. She appears well-developed and well-nourished. No distress.  HENT:  Head: Normocephalic and atraumatic.  Right Ear: External ear normal.  Left Ear: External ear normal.  Nose: Nose normal.  Mouth/Throat: Oropharynx is clear and moist. No oropharyngeal exudate.  Eyes: Conjunctivae are normal. Pupils are equal, round, and reactive to light. Right eye exhibits no discharge. Left eye exhibits no discharge. No scleral icterus.  Neck: Normal range of motion. Neck supple. No tracheal deviation present. No thyromegaly present.  Cardiovascular: Normal rate, regular rhythm, normal heart sounds and intact distal pulses.  Exam reveals no gallop and no friction rub.   No murmur heard. Pulmonary/Chest: Effort normal and breath sounds normal. No accessory muscle usage. Not tachypneic. No respiratory  distress. She has no decreased breath sounds. She has no wheezes. She has no rhonchi. She has no rales. She exhibits no tenderness.  Abdominal: There is no tenderness (no CVA tenderness).  Musculoskeletal: Normal range of motion. She exhibits no edema and no tenderness.  Lymphadenopathy:    She has no cervical adenopathy.  Neurological: She is alert and oriented to person, place, and time. No cranial nerve deficit. She exhibits normal muscle tone. Coordination normal.  Skin: Skin is warm and dry. No rash noted. She is not diaphoretic. No erythema. No pallor.  Psychiatric: She has a normal mood and affect. Her behavior is normal. Judgment and thought content normal.          Assessment & Plan:   Problem List Items Addressed This Visit     Unprioritized   Hypertension      BP Readings from Last 3 Encounters:  10/08/13 120/58  09/03/13 140/60  07/22/13 128/68   BP well controlled on Triamterine-HCTZ. Will continue. Follow up 3 months and prn.    Infection of urinary tract - Primary     Urinalysis c/w UTI. Will send urine for culture. Will start empiric Cipro. Follow up prn fever, chills, dysuria, or other concerns..    Relevant Medications      ciprofloxacin (CIPRO) tablet   Other Relevant Orders      CULTURE, URINE COMPREHENSIVE    Other Visit Diagnoses   Dysuria        Relevant Orders       POCT Urinalysis Dipstick (Completed)        Return in about 3 months (around 01/08/2014), or if symptoms worsen or fail to improve, for Wellness Visit.

## 2013-10-08 NOTE — Progress Notes (Signed)
Pre visit review using our clinic review tool, if applicable. No additional management support is needed unless otherwise documented below in the visit note. 

## 2013-10-09 DIAGNOSIS — L821 Other seborrheic keratosis: Secondary | ICD-10-CM | POA: Diagnosis not present

## 2013-10-09 DIAGNOSIS — Z85820 Personal history of malignant melanoma of skin: Secondary | ICD-10-CM | POA: Diagnosis not present

## 2013-10-17 DIAGNOSIS — I509 Heart failure, unspecified: Secondary | ICD-10-CM | POA: Diagnosis not present

## 2013-10-17 DIAGNOSIS — I959 Hypotension, unspecified: Secondary | ICD-10-CM | POA: Diagnosis not present

## 2013-10-17 DIAGNOSIS — Z87440 Personal history of urinary (tract) infections: Secondary | ICD-10-CM | POA: Diagnosis not present

## 2013-10-17 DIAGNOSIS — J45909 Unspecified asthma, uncomplicated: Secondary | ICD-10-CM | POA: Diagnosis not present

## 2013-10-17 DIAGNOSIS — M159 Polyosteoarthritis, unspecified: Secondary | ICD-10-CM | POA: Diagnosis not present

## 2013-10-17 DIAGNOSIS — I10 Essential (primary) hypertension: Secondary | ICD-10-CM | POA: Diagnosis not present

## 2013-10-18 LAB — CULTURE, URINE COMPREHENSIVE: Colony Count: >=100000

## 2013-10-25 ENCOUNTER — Other Ambulatory Visit: Payer: Self-pay | Admitting: Internal Medicine

## 2013-10-28 DIAGNOSIS — I10 Essential (primary) hypertension: Secondary | ICD-10-CM | POA: Diagnosis not present

## 2013-10-28 DIAGNOSIS — I959 Hypotension, unspecified: Secondary | ICD-10-CM | POA: Diagnosis not present

## 2013-10-28 DIAGNOSIS — Z87440 Personal history of urinary (tract) infections: Secondary | ICD-10-CM | POA: Diagnosis not present

## 2013-10-28 DIAGNOSIS — M159 Polyosteoarthritis, unspecified: Secondary | ICD-10-CM | POA: Diagnosis not present

## 2013-10-28 DIAGNOSIS — I509 Heart failure, unspecified: Secondary | ICD-10-CM | POA: Diagnosis not present

## 2013-10-28 DIAGNOSIS — J45909 Unspecified asthma, uncomplicated: Secondary | ICD-10-CM | POA: Diagnosis not present

## 2013-10-29 ENCOUNTER — Encounter: Payer: Medicare Other | Admitting: Internal Medicine

## 2013-11-11 DIAGNOSIS — I959 Hypotension, unspecified: Secondary | ICD-10-CM | POA: Diagnosis not present

## 2013-11-11 DIAGNOSIS — I509 Heart failure, unspecified: Secondary | ICD-10-CM | POA: Diagnosis not present

## 2013-11-11 DIAGNOSIS — I10 Essential (primary) hypertension: Secondary | ICD-10-CM | POA: Diagnosis not present

## 2013-11-11 DIAGNOSIS — Z87440 Personal history of urinary (tract) infections: Secondary | ICD-10-CM | POA: Diagnosis not present

## 2013-11-11 DIAGNOSIS — J45909 Unspecified asthma, uncomplicated: Secondary | ICD-10-CM | POA: Diagnosis not present

## 2013-11-11 DIAGNOSIS — M159 Polyosteoarthritis, unspecified: Secondary | ICD-10-CM | POA: Diagnosis not present

## 2013-11-25 ENCOUNTER — Other Ambulatory Visit: Payer: Self-pay | Admitting: Internal Medicine

## 2013-12-16 ENCOUNTER — Telehealth: Payer: Self-pay | Admitting: *Deleted

## 2013-12-16 NOTE — Telephone Encounter (Signed)
Pt called stating that she feels she has a UTI again and is asking for medication called in.  Advised pt that she will need to be seen but she asked for me to still ask.

## 2013-12-16 NOTE — Telephone Encounter (Signed)
You were right, Almyra Free. She needs to be seen

## 2013-12-17 ENCOUNTER — Ambulatory Visit (INDEPENDENT_AMBULATORY_CARE_PROVIDER_SITE_OTHER): Payer: Medicare Other | Admitting: Adult Health

## 2013-12-17 ENCOUNTER — Encounter: Payer: Self-pay | Admitting: Adult Health

## 2013-12-17 VITALS — BP 130/58 | HR 75 | Temp 98.4°F | Resp 16 | Wt 187.0 lb

## 2013-12-17 DIAGNOSIS — R82998 Other abnormal findings in urine: Secondary | ICD-10-CM | POA: Diagnosis not present

## 2013-12-17 DIAGNOSIS — R8290 Unspecified abnormal findings in urine: Secondary | ICD-10-CM | POA: Insufficient documentation

## 2013-12-17 LAB — POCT URINALYSIS DIPSTICK
BILIRUBIN UA: NEGATIVE
Glucose, UA: NEGATIVE
KETONES UA: NEGATIVE
Nitrite, UA: NEGATIVE
PH UA: 6.5
Protein, UA: NEGATIVE
Spec Grav, UA: 1.02
Urobilinogen, UA: 0.2

## 2013-12-17 MED ORDER — CIPROFLOXACIN HCL 250 MG PO TABS: 250.0000 mg | ORAL_TABLET | Freq: Two times a day (BID) | ORAL | Status: DC

## 2013-12-17 NOTE — Progress Notes (Signed)
Patient ID: ALURA OLVEDA, female   DOB: 06/23/1921, 80 y.o.   MRN: [id number]   Subjective:    Patient ID: ZANAI MALLARI, female    DOB: 06/03/1921, 80 y.o.   MRN: [id number]  HPI  Pt is a 80 y/o female who presents to clinic with reports of having foul odor of urine. She reports that she usually does not have dysuria. Was concerned about letting her symptoms go too long without being seen. No fever, chills.   Past Medical History  Diagnosis Date  . Hypertension   . Hyperlipidemia   . Hernia   . Urine incontinence   . COPD (chronic obstructive pulmonary disease)   . Arthritis     osteo  . GERD (gastroesophageal reflux disease)   . Heart murmur   . Syncope and collapse   . Melanoma     face    Current Outpatient Prescriptions on File Prior to Visit  Medication Sig Dispense Refill  . aspirin 81 MG tablet Take 81 mg by mouth daily.        Marland Kitchen atenolol (TENORMIN) 25 MG tablet TAKE ONE (1) TABLET BY MOUTH EVERY DAY  90 tablet  1  . Biotin 1000 MCG tablet Take 1,000 mcg by mouth daily.        . cholecalciferol (VITAMIN D) 400 UNITS TABS Take 400 Units by mouth 2 (two) times daily.        . enalapril (VASOTEC) 20 MG tablet Take 1 tablet by mouth Twice daily.      . Flaxseed, Linseed, (FLAX SEED OIL) 1000 MG CAPS Take 1 capsule by mouth daily.        Marland Kitchen gabapentin (NEURONTIN) 300 MG capsule TAKE ONE CAPSULE BY MOUTH DAILY  90 capsule  6  . Multiple Vitamin (MULTIVITAMIN) capsule Take 1 capsule by mouth daily.        . nitroGLYCERIN (NITROSTAT) 0.3 MG SL tablet Place 1 tablet (0.3 mg total) under the tongue every 5 (five) minutes as needed.  90 tablet  0  . NUCYNTA 50 MG TABS tablet Take 50 mg by mouth 2 (two) times daily as needed (Take 1 tablet twice a day as needed).       . nystatin (MYCOSTATIN/NYSTOP) 100000 UNIT/GM POWD Apply 1 application topically BID times 48H.  30 g  1  . omeprazole (PRILOSEC) 20 MG capsule TAKE ONE CAPSULE BY MOUTH DAILY  90 capsule  1  . oxybutynin (DITROPAN)  5 MG tablet TAKE ONE (1) TABLET BY MOUTH EVERY DAY  30 tablet  5  . PREMARIN 0.3 MG tablet TAKE ONE (1) TABLET BY MOUTH EVERY DAY  30 tablet  5  . sertraline (ZOLOFT) 100 MG tablet TAKE ONE (1) TABLET EACH DAY  30 tablet  6  . triamterene-hydrochlorothiazide (DYAZIDE) 37.5-25 MG per capsule TAKE ONE CAPSULE BY MOUTH DAILY  30 capsule  3  . verapamil (CALAN-SR) 180 MG CR tablet TAKE ONE TABLET BY MOUTH EVERY NIGHT AT BEDTIME  90 tablet  1  . ciprofloxacin (CIPRO) 250 MG tablet Take 1 tablet (250 mg total) by mouth 2 (two) times daily.  14 tablet  0  . fluticasone (FLOVENT HFA) 110 MCG/ACT inhaler Inhale 2 puffs into the lungs daily.  1 Inhaler  5  . lidocaine (LIDODERM) 5 % Place 1 patch onto the skin every 12 (twelve) hours. Remove & Discard patch within 12 hours or as directed by MD  30 patch  3   No current facility-administered medications  on file prior to visit.     Review of Systems  Constitutional: Negative for fever and chills.  Genitourinary: Positive for urgency and frequency. Negative for dysuria, hematuria, flank pain and difficulty urinating.       Foul odor of urine       Objective:  BP 130/58  Pulse 75  Temp(Src) 98.4 F (36.9 C) (Oral)  Resp 16  Wt 187 lb (84.823 kg)  SpO2 97%   Physical Exam  Constitutional: She is oriented to person, place, and time. No distress.  Cardiovascular: Normal rate and regular rhythm.   Pulmonary/Chest: Effort normal. No respiratory distress.  Musculoskeletal: Normal range of motion.  Neurological: She is alert and oriented to person, place, and time.  Skin: Skin is warm and dry.  Psychiatric: She has a normal mood and affect. Her behavior is normal. Judgment and thought content normal.      Assessment & Plan:   1. Bad odor of urine UA shows possible UTI; send for culture. Start Cipro bid x 5 days. RTC if no improvement within 3-4 days or sooner if necessary.

## 2013-12-17 NOTE — Progress Notes (Signed)
Pre visit review using our clinic review tool, if applicable. No additional management support is needed unless otherwise documented below in the visit note. 

## 2013-12-17 NOTE — Patient Instructions (Signed)

## 2013-12-19 LAB — URINE CULTURE

## 2013-12-27 ENCOUNTER — Other Ambulatory Visit: Payer: Self-pay | Admitting: Internal Medicine

## 2014-01-09 ENCOUNTER — Encounter: Payer: Medicare Other | Admitting: Internal Medicine

## 2014-01-15 ENCOUNTER — Encounter: Payer: Self-pay | Admitting: Internal Medicine

## 2014-01-15 ENCOUNTER — Ambulatory Visit (INDEPENDENT_AMBULATORY_CARE_PROVIDER_SITE_OTHER): Payer: Medicare Other | Admitting: Internal Medicine

## 2014-01-15 VITALS — BP 136/60 | HR 61 | Temp 98.0°F | Resp 14 | Ht 63.5 in | Wt 188.5 lb

## 2014-01-15 DIAGNOSIS — Z0000 Encounter for general adult medical examination without abnormal findings: Secondary | ICD-10-CM

## 2014-01-15 DIAGNOSIS — E785 Hyperlipidemia, unspecified: Secondary | ICD-10-CM

## 2014-01-15 LAB — CBC WITH DIFFERENTIAL/PLATELET
Basophils Absolute: 0.1 10*3/uL (ref 0.0–0.1)
Basophils Relative: 0.8 % (ref 0.0–3.0)
EOS PCT: 3.6 % (ref 0.0–5.0)
Eosinophils Absolute: 0.2 10*3/uL (ref 0.0–0.7)
HEMATOCRIT: 37.9 % (ref 36.0–46.0)
HEMOGLOBIN: 12.6 g/dL (ref 12.0–15.0)
LYMPHS ABS: 2.2 10*3/uL (ref 0.7–4.0)
Lymphocytes Relative: 33.3 % (ref 12.0–46.0)
MCHC: 33.3 g/dL (ref 30.0–36.0)
MCV: 93.7 fl (ref 78.0–100.0)
MONO ABS: 0.6 10*3/uL (ref 0.1–1.0)
MONOS PCT: 9 % (ref 3.0–12.0)
NEUTROS ABS: 3.6 10*3/uL (ref 1.4–7.7)
Neutrophils Relative %: 53.3 % (ref 43.0–77.0)
Platelets: 250 10*3/uL (ref 150.0–400.0)
RBC: 4.04 Mil/uL (ref 3.87–5.11)
RDW: 15.4 % (ref 11.5–15.5)
WBC: 6.7 10*3/uL (ref 4.0–10.5)

## 2014-01-15 LAB — COMPREHENSIVE METABOLIC PANEL
ALT: 14 U/L (ref 0–35)
AST: 22 U/L (ref 0–37)
Albumin: 3.4 g/dL — ABNORMAL LOW (ref 3.5–5.2)
Alkaline Phosphatase: 64 U/L (ref 39–117)
BUN: 21 mg/dL (ref 6–23)
CO2: 25 meq/L (ref 19–32)
CREATININE: 1.1 mg/dL (ref 0.4–1.2)
Calcium: 9.4 mg/dL (ref 8.4–10.5)
Chloride: 105 mEq/L (ref 96–112)
GFR: 48.03 mL/min — AB (ref >60.00)
GLUCOSE: 90 mg/dL (ref 70–99)
Potassium: 4.7 mEq/L (ref 3.5–5.1)
SODIUM: 140 meq/L (ref 135–145)
TOTAL PROTEIN: 7 g/dL (ref 6.0–8.3)
Total Bilirubin: 0.8 mg/dL (ref 0.2–1.2)

## 2014-01-15 LAB — MICROALBUMIN / CREATININE URINE RATIO
CREATININE, U: 97.9 mg/dL
Microalb Creat Ratio: 0.7 mg/g (ref 0.0–30.0)
Microalb, Ur: 0.7 mg/dL (ref 0.0–1.9)

## 2014-01-15 LAB — LIPID PANEL
CHOL/HDL RATIO: 5
Cholesterol: 247 mg/dL — ABNORMAL HIGH (ref 0–200)
HDL: 49.9 mg/dL (ref >39.00)
NonHDL: 197.1
TRIGLYCERIDES: 229 mg/dL — AB (ref 0.0–149.0)
VLDL: 45.8 mg/dL — ABNORMAL HIGH (ref 0.0–40.0)

## 2014-01-15 LAB — LDL CHOLESTEROL, DIRECT: Direct LDL: 175.7 mg/dL

## 2014-01-15 NOTE — Assessment & Plan Note (Signed)
General medical exam is normal today. Health maintenance is up to date. Appropriate screening performed. Will check basic labs including CBC, CMP, lipid profile. Patient will followup in 3 months or sooner as needed.

## 2014-01-15 NOTE — Progress Notes (Signed)
Pre visit review using our clinic review tool, if applicable. No additional management support is needed unless otherwise documented below in the visit note. 

## 2014-01-15 NOTE — Progress Notes (Signed)
Subjective:    Patient ID: Cindy Graham, female    DOB: January 28, 1922, 80 y.o.   MRN: [id number]  HPI The patient is here for annual Medicare wellness examination and management of other chronic and acute problems.   The risk factors are reflected in the social history.  The roster of all physicians providing medical care to patient - is listed in the Snapshot section of the chart.  Activities of daily living:  The patient is mostly independent in all ADLs: dressing, toileting, feeding as well as independent mobility. Has assistance taking trash out. Does not drive at night. Uses walker in home. Has rails in her bathroom. Also has shower seat. CNA helps with shower. Lives alone, wears an alert device. Prepares own food.Children help with groceries.  Home safety : The patient has smoke detectors in the home. Wears an alert device. They wear seatbelts.  There are no firearms at home. There is no violence in the home.   There is no risks for hepatitis, STDs or HIV. There is no history of blood transfusion. They have no travel history to infectious disease endemic areas of the world.  The patient has seen their dentist in the last six month. (Dr. Albesa Seen)  They have seen their eye doctor in the last year. (Dr. Linton Flemings) Dermatologist - Dr. Evorn Gong. Discussed the need for sun protection: hats, long sleeves and use of sunscreen if there is significant sun exposure.  Wears hearing aid, still has difficulty hearing TV.  They have deferred audiologic testing in the last year.    Follows relatively healthy diet. Eats full diet. Appetite good.  The benefits of regular aerobic exercise were discussed. She walks occasionally.  Depression screen: there are no signs or vegative symptoms of depression- irritability, change in appetite, anhedonia, sadness/tearfullness.  Cognitive assessment: the patient manages all their financial and personal affairs and is actively engaged. They could relate  day,date,year and events.  HCPOA - son, Ashlye O[place]iedo. Living Will in place.  The following portions of the patient's history were reviewed and updated as appropriate: allergies, current medications, past family history, past medical history,  past surgical history, past social history  and problem list.  Visual acuity was not assessed per patient preference since she has regular follow up with her ophthalmologist. Hearing and body mass index were assessed and reviewed.   During the course of the visit the patient was educated and counseled about appropriate screening and preventive services including : fall prevention , diabetes screening, nutrition counseling, colorectal cancer screening, and recommended immunizations.    Review of Systems  Constitutional: Negative for fever, chills, appetite change, fatigue and unexpected weight change.  Eyes: Negative for visual disturbance.  Respiratory: Negative for shortness of breath.   Cardiovascular: Negative for chest pain and leg swelling.  Gastrointestinal: Negative for nausea, vomiting, abdominal pain, diarrhea and constipation.  Musculoskeletal: Positive for arthralgias, back pain, gait problem and myalgias.  Skin: Negative for color change and rash.  Neurological: Positive for tremors and weakness. Negative for dizziness, seizures, speech difficulty and headaches.  Hematological: Negative for adenopathy. Does not bruise/bleed easily.  Psychiatric/Behavioral: Negative for dysphoric mood. The patient is not nervous/anxious.        Objective:    BP 136/60  Pulse 61  Temp(Src) 98 F (36.7 C) (Oral)  Resp 14  Ht 5' 3.5" (1.613 m)  Wt 188 lb 8 oz (85.503 kg)  BMI 32.86 kg/m2  SpO2 86% Physical Exam  Constitutional: She is oriented  to person, place, and time. She appears well-developed and well-nourished. No distress.  HENT:  Head: Normocephalic and atraumatic.  Right Ear: External ear normal.  Left Ear: External ear normal.  Nose: Nose  normal.  Mouth/Throat: Oropharynx is clear and moist. No oropharyngeal exudate.  Eyes: Conjunctivae are normal. Pupils are equal, round, and reactive to light. Right eye exhibits no discharge. Left eye exhibits no discharge. No scleral icterus.  Neck: Normal range of motion. Neck supple. No tracheal deviation present. No thyromegaly present.  Cardiovascular: Normal rate, regular rhythm, normal heart sounds and intact distal pulses.  Exam reveals no gallop and no friction rub.   No murmur heard. Pulmonary/Chest: Effort normal and breath sounds normal. No respiratory distress. She has no wheezes. She has no rales. She exhibits no tenderness.  Musculoskeletal: Normal range of motion. She exhibits no edema and no tenderness.  Lymphadenopathy:    She has no cervical adenopathy.  Neurological: She is alert and oriented to person, place, and time. No cranial nerve deficit. She exhibits normal muscle tone. Coordination normal.  Skin: Skin is warm and dry. No rash noted. She is not diaphoretic. No erythema. No pallor.  Psychiatric: She has a normal mood and affect. Her behavior is normal. Judgment and thought content normal.          Assessment & Plan:   Problem List Items Addressed This Visit     Unprioritized   Medicare annual wellness visit, subsequent - Primary     General medical exam is normal today. Health maintenance is up to date. Appropriate screening performed. Will check basic labs including CBC, CMP, lipid profile. Patient will followup in 3 months or sooner as needed.      Relevant Orders      CBC with Differential      Comprehensive metabolic panel      Lipid panel      Microalbumin / creatinine urine ratio       Return in about 6 months (around 07/18/2014) for Recheck.

## 2014-01-15 NOTE — Patient Instructions (Signed)

## 2014-01-23 ENCOUNTER — Ambulatory Visit: Payer: Self-pay | Admitting: Internal Medicine

## 2014-01-23 DIAGNOSIS — Z1231 Encounter for screening mammogram for malignant neoplasm of breast: Secondary | ICD-10-CM | POA: Diagnosis not present

## 2014-01-29 DIAGNOSIS — M5126 Other intervertebral disc displacement, lumbar region: Secondary | ICD-10-CM | POA: Diagnosis not present

## 2014-01-29 DIAGNOSIS — IMO0002 Reserved for concepts with insufficient information to code with codable children: Secondary | ICD-10-CM | POA: Diagnosis not present

## 2014-01-30 ENCOUNTER — Encounter: Payer: Self-pay | Admitting: Internal Medicine

## 2014-02-03 ENCOUNTER — Telehealth: Payer: Self-pay | Admitting: *Deleted

## 2014-02-03 NOTE — Telephone Encounter (Signed)
[facility], [name], called stating that pt took her blood pressure Friday morning and it was 92/59 with HR of 57, when the nurse got there it was 118/64 with HR 64 and she has lost 2 lbs.  Nurse sees pt once a month, nurse is requesting that she sees pt more than 1 time a month.

## 2014-02-03 NOTE — Telephone Encounter (Signed)
Nurse has agreed to see her 2 times weekly

## 2014-02-03 NOTE — Telephone Encounter (Signed)
Agree. Can the nurse see her twice weekly?

## 2014-02-04 DIAGNOSIS — I959 Hypotension, unspecified: Secondary | ICD-10-CM | POA: Diagnosis not present

## 2014-02-04 DIAGNOSIS — R32 Unspecified urinary incontinence: Secondary | ICD-10-CM | POA: Diagnosis not present

## 2014-02-04 DIAGNOSIS — R001 Bradycardia, unspecified: Secondary | ICD-10-CM | POA: Diagnosis not present

## 2014-02-04 DIAGNOSIS — I509 Heart failure, unspecified: Secondary | ICD-10-CM | POA: Diagnosis not present

## 2014-02-04 DIAGNOSIS — R350 Frequency of micturition: Secondary | ICD-10-CM | POA: Diagnosis not present

## 2014-02-20 ENCOUNTER — Ambulatory Visit (INDEPENDENT_AMBULATORY_CARE_PROVIDER_SITE_OTHER): Payer: Medicare Other | Admitting: Podiatry

## 2014-02-20 ENCOUNTER — Encounter: Payer: Self-pay | Admitting: Podiatry

## 2014-02-20 VITALS — BP 128/58 | HR 63 | Resp 16 | Ht 63.0 in | Wt 188.0 lb

## 2014-02-20 DIAGNOSIS — L84 Corns and callosities: Secondary | ICD-10-CM

## 2014-02-20 NOTE — Patient Instructions (Signed)
Antibiotic ointment and band aid to affected area Monitor for any signs/symptoms of infection. Call the office immediately if any occur or go directly to the emergency room. Call with any questions/concerns.

## 2014-02-23 NOTE — Progress Notes (Signed)
Patient ID: Cindy Graham, female   DOB: Apr 04, 1922, 80 y.o.   MRN: [id number]  Subjective: Cindy Graham, 80 year old female,  Presents the office today with complaints of right third digit pain along the tip of her toe. 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 She denies any systemic complaints such as fevers, chills, nausea, vomiting. She previously has been treated for an abscess on the right third toe last year. No other complaints today. Denies any history of injury or trauma to the area.  Objective: AAO x3, NAD DP/PT pulses decreased bilaterally. CRT less than 3 seconds. Decreased protective sensation with Derrel Nip monofilament, vibratory sensation decreased. Thick hyperkeratotic tissue the distal aspect of the right third toe. Upon debridement there is a small pinpoint superficial opening without any surrounding erythema, drainage, ascending cellulitis, malodor, fluctuance, crepitus. Underlying hammertoe contractures. No calf pain, swelling, warmth.  Assessment: 80 year old female with thick hyperkeratotic tissue distal right third toe due to underlying hammertoe deformity. Upon debridement small superficial opening without signs of infection.  Plan: -Various treatment options discussed including alternatives, risks, complications. -Hyperkeratotic tissue sharply debrided without complication. -Apply band-aid/dressing over the area to help prevent pressure. Monitor for any signs/symptoms of infection and directed to call the office immediately if any are to occur or go directly to the emergency room. She states that she is not allergic antibiotic ointment. She has no problems she can apply this over the site. -Follow up in 3 weeks to ensure healing or sooner if any problems arise, or any change in symptoms. Call the office with any questions, concerns in the meantime.

## 2014-03-13 ENCOUNTER — Ambulatory Visit: Payer: Medicare Other | Admitting: Podiatry

## 2014-03-24 DIAGNOSIS — Z23 Encounter for immunization: Secondary | ICD-10-CM | POA: Diagnosis not present

## 2014-03-31 ENCOUNTER — Other Ambulatory Visit: Payer: Self-pay | Admitting: Internal Medicine

## 2014-04-03 ENCOUNTER — Telehealth: Payer: Self-pay | Admitting: *Deleted

## 2014-04-03 NOTE — Telephone Encounter (Signed)
Notified nurse

## 2014-04-03 NOTE — Telephone Encounter (Signed)
Cindy Graham, Home health nurse called for verbal orders for urine specimen to be collected.  Pt is having urine frequency and urgency.  She also needs verbal orders to continue home health services

## 2014-04-03 NOTE — Telephone Encounter (Signed)
Fine to send order for UA and culture

## 2014-04-04 DIAGNOSIS — R350 Frequency of micturition: Secondary | ICD-10-CM | POA: Diagnosis not present

## 2014-04-04 DIAGNOSIS — R3915 Urgency of urination: Secondary | ICD-10-CM | POA: Diagnosis not present

## 2014-04-05 DIAGNOSIS — I509 Heart failure, unspecified: Secondary | ICD-10-CM | POA: Diagnosis not present

## 2014-04-05 DIAGNOSIS — R32 Unspecified urinary incontinence: Secondary | ICD-10-CM | POA: Diagnosis not present

## 2014-04-05 DIAGNOSIS — I959 Hypotension, unspecified: Secondary | ICD-10-CM | POA: Diagnosis not present

## 2014-04-05 DIAGNOSIS — R001 Bradycardia, unspecified: Secondary | ICD-10-CM | POA: Diagnosis not present

## 2014-04-05 DIAGNOSIS — R350 Frequency of micturition: Secondary | ICD-10-CM | POA: Diagnosis not present

## 2014-04-07 ENCOUNTER — Telehealth: Payer: Self-pay | Admitting: Internal Medicine

## 2014-04-07 ENCOUNTER — Other Ambulatory Visit: Payer: Self-pay | Admitting: Internal Medicine

## 2014-04-07 MED ORDER — CIPROFLOXACIN HCL 250 MG PO TABS: 250.0000 mg | ORAL_TABLET | Freq: Two times a day (BID) | ORAL | Status: DC

## 2014-04-07 NOTE — Telephone Encounter (Signed)
Recent urinalysis and culture showed infection with Klebsiella. Has this been treated? If not, I would recommend starting Cipro 250mg  po bid x 7 days.

## 2014-04-07 NOTE — Telephone Encounter (Signed)
Pt notified and  verbalized understanding. States she has been having urinary frequency and odor. Has not been treated yet. Rx sent to pharmacy by escript. Recommended call back with worsening or persistent symptoms.

## 2014-04-08 DIAGNOSIS — R350 Frequency of micturition: Secondary | ICD-10-CM | POA: Diagnosis not present

## 2014-04-08 DIAGNOSIS — R32 Unspecified urinary incontinence: Secondary | ICD-10-CM | POA: Diagnosis not present

## 2014-04-08 DIAGNOSIS — I509 Heart failure, unspecified: Secondary | ICD-10-CM | POA: Diagnosis not present

## 2014-04-08 DIAGNOSIS — R001 Bradycardia, unspecified: Secondary | ICD-10-CM | POA: Diagnosis not present

## 2014-04-08 DIAGNOSIS — I959 Hypotension, unspecified: Secondary | ICD-10-CM | POA: Diagnosis not present

## 2014-04-18 DIAGNOSIS — R32 Unspecified urinary incontinence: Secondary | ICD-10-CM | POA: Diagnosis not present

## 2014-04-18 DIAGNOSIS — I959 Hypotension, unspecified: Secondary | ICD-10-CM | POA: Diagnosis not present

## 2014-04-18 DIAGNOSIS — R350 Frequency of micturition: Secondary | ICD-10-CM | POA: Diagnosis not present

## 2014-04-18 DIAGNOSIS — R001 Bradycardia, unspecified: Secondary | ICD-10-CM | POA: Diagnosis not present

## 2014-04-18 DIAGNOSIS — I509 Heart failure, unspecified: Secondary | ICD-10-CM | POA: Diagnosis not present

## 2014-04-22 ENCOUNTER — Encounter: Payer: Self-pay | Admitting: Internal Medicine

## 2014-04-22 DIAGNOSIS — R350 Frequency of micturition: Secondary | ICD-10-CM | POA: Diagnosis not present

## 2014-04-22 DIAGNOSIS — R001 Bradycardia, unspecified: Secondary | ICD-10-CM | POA: Diagnosis not present

## 2014-04-22 DIAGNOSIS — I959 Hypotension, unspecified: Secondary | ICD-10-CM | POA: Diagnosis not present

## 2014-04-22 DIAGNOSIS — I509 Heart failure, unspecified: Secondary | ICD-10-CM | POA: Diagnosis not present

## 2014-04-22 DIAGNOSIS — R32 Unspecified urinary incontinence: Secondary | ICD-10-CM | POA: Diagnosis not present

## 2014-04-30 DIAGNOSIS — I509 Heart failure, unspecified: Secondary | ICD-10-CM | POA: Diagnosis not present

## 2014-04-30 DIAGNOSIS — R350 Frequency of micturition: Secondary | ICD-10-CM | POA: Diagnosis not present

## 2014-04-30 DIAGNOSIS — I959 Hypotension, unspecified: Secondary | ICD-10-CM | POA: Diagnosis not present

## 2014-04-30 DIAGNOSIS — R001 Bradycardia, unspecified: Secondary | ICD-10-CM | POA: Diagnosis not present

## 2014-04-30 DIAGNOSIS — R32 Unspecified urinary incontinence: Secondary | ICD-10-CM | POA: Diagnosis not present

## 2014-05-05 ENCOUNTER — Other Ambulatory Visit: Payer: Self-pay | Admitting: Internal Medicine

## 2014-05-05 DIAGNOSIS — R32 Unspecified urinary incontinence: Secondary | ICD-10-CM | POA: Diagnosis not present

## 2014-05-05 DIAGNOSIS — R001 Bradycardia, unspecified: Secondary | ICD-10-CM | POA: Diagnosis not present

## 2014-05-05 DIAGNOSIS — I509 Heart failure, unspecified: Secondary | ICD-10-CM | POA: Diagnosis not present

## 2014-05-05 DIAGNOSIS — R350 Frequency of micturition: Secondary | ICD-10-CM | POA: Diagnosis not present

## 2014-05-05 DIAGNOSIS — I959 Hypotension, unspecified: Secondary | ICD-10-CM | POA: Diagnosis not present

## 2014-05-12 DIAGNOSIS — I509 Heart failure, unspecified: Secondary | ICD-10-CM | POA: Diagnosis not present

## 2014-05-12 DIAGNOSIS — R350 Frequency of micturition: Secondary | ICD-10-CM | POA: Diagnosis not present

## 2014-05-12 DIAGNOSIS — I959 Hypotension, unspecified: Secondary | ICD-10-CM | POA: Diagnosis not present

## 2014-05-12 DIAGNOSIS — R001 Bradycardia, unspecified: Secondary | ICD-10-CM | POA: Diagnosis not present

## 2014-05-12 DIAGNOSIS — R32 Unspecified urinary incontinence: Secondary | ICD-10-CM | POA: Diagnosis not present

## 2014-05-14 ENCOUNTER — Telehealth: Payer: Self-pay | Admitting: *Deleted

## 2014-05-14 DIAGNOSIS — I959 Hypotension, unspecified: Secondary | ICD-10-CM | POA: Diagnosis not present

## 2014-05-14 DIAGNOSIS — I509 Heart failure, unspecified: Secondary | ICD-10-CM | POA: Diagnosis not present

## 2014-05-14 DIAGNOSIS — R350 Frequency of micturition: Secondary | ICD-10-CM | POA: Diagnosis not present

## 2014-05-14 DIAGNOSIS — R001 Bradycardia, unspecified: Secondary | ICD-10-CM | POA: Diagnosis not present

## 2014-05-14 DIAGNOSIS — R32 Unspecified urinary incontinence: Secondary | ICD-10-CM | POA: Diagnosis not present

## 2014-05-14 NOTE — Telephone Encounter (Signed)
[name] from [facility], requesting to keep weekly visits with pt.  Pt was complaining of having sharpe stabbing pain in left eye, that has subsided and now has nasal drainage. [name] states that everything is fine but feels that she needs to keep an eye on her still.

## 2014-05-14 NOTE — Telephone Encounter (Signed)
Fine to keep weekly visits.

## 2014-05-14 NOTE — Telephone Encounter (Signed)
Notified [facility]

## 2014-05-22 DIAGNOSIS — R32 Unspecified urinary incontinence: Secondary | ICD-10-CM | POA: Diagnosis not present

## 2014-05-22 DIAGNOSIS — R001 Bradycardia, unspecified: Secondary | ICD-10-CM | POA: Diagnosis not present

## 2014-05-22 DIAGNOSIS — I959 Hypotension, unspecified: Secondary | ICD-10-CM | POA: Diagnosis not present

## 2014-05-22 DIAGNOSIS — I509 Heart failure, unspecified: Secondary | ICD-10-CM | POA: Diagnosis not present

## 2014-05-22 DIAGNOSIS — R350 Frequency of micturition: Secondary | ICD-10-CM | POA: Diagnosis not present

## 2014-05-26 DIAGNOSIS — M5416 Radiculopathy, lumbar region: Secondary | ICD-10-CM | POA: Diagnosis not present

## 2014-05-26 DIAGNOSIS — M5126 Other intervertebral disc displacement, lumbar region: Secondary | ICD-10-CM | POA: Diagnosis not present

## 2014-05-29 DIAGNOSIS — R001 Bradycardia, unspecified: Secondary | ICD-10-CM | POA: Diagnosis not present

## 2014-05-29 DIAGNOSIS — I959 Hypotension, unspecified: Secondary | ICD-10-CM | POA: Diagnosis not present

## 2014-05-29 DIAGNOSIS — R32 Unspecified urinary incontinence: Secondary | ICD-10-CM | POA: Diagnosis not present

## 2014-05-29 DIAGNOSIS — R350 Frequency of micturition: Secondary | ICD-10-CM | POA: Diagnosis not present

## 2014-05-29 DIAGNOSIS — I509 Heart failure, unspecified: Secondary | ICD-10-CM | POA: Diagnosis not present

## 2014-06-02 DIAGNOSIS — I959 Hypotension, unspecified: Secondary | ICD-10-CM | POA: Diagnosis not present

## 2014-06-02 DIAGNOSIS — R32 Unspecified urinary incontinence: Secondary | ICD-10-CM | POA: Diagnosis not present

## 2014-06-02 DIAGNOSIS — R350 Frequency of micturition: Secondary | ICD-10-CM | POA: Diagnosis not present

## 2014-06-02 DIAGNOSIS — R001 Bradycardia, unspecified: Secondary | ICD-10-CM | POA: Diagnosis not present

## 2014-06-02 DIAGNOSIS — I509 Heart failure, unspecified: Secondary | ICD-10-CM | POA: Diagnosis not present

## 2014-06-04 ENCOUNTER — Other Ambulatory Visit: Payer: Self-pay | Admitting: Internal Medicine

## 2014-06-04 DIAGNOSIS — R32 Unspecified urinary incontinence: Secondary | ICD-10-CM | POA: Diagnosis not present

## 2014-06-04 DIAGNOSIS — I509 Heart failure, unspecified: Secondary | ICD-10-CM | POA: Diagnosis not present

## 2014-06-04 DIAGNOSIS — I959 Hypotension, unspecified: Secondary | ICD-10-CM | POA: Diagnosis not present

## 2014-06-04 DIAGNOSIS — Z9981 Dependence on supplemental oxygen: Secondary | ICD-10-CM | POA: Diagnosis not present

## 2014-06-04 DIAGNOSIS — R001 Bradycardia, unspecified: Secondary | ICD-10-CM | POA: Diagnosis not present

## 2014-06-04 DIAGNOSIS — M19019 Primary osteoarthritis, unspecified shoulder: Secondary | ICD-10-CM | POA: Diagnosis not present

## 2014-06-10 ENCOUNTER — Other Ambulatory Visit: Payer: Self-pay | Admitting: Internal Medicine

## 2014-06-10 ENCOUNTER — Telehealth: Payer: Self-pay | Admitting: *Deleted

## 2014-06-10 DIAGNOSIS — Z9981 Dependence on supplemental oxygen: Secondary | ICD-10-CM | POA: Diagnosis not present

## 2014-06-10 DIAGNOSIS — I509 Heart failure, unspecified: Secondary | ICD-10-CM | POA: Diagnosis not present

## 2014-06-10 DIAGNOSIS — R32 Unspecified urinary incontinence: Secondary | ICD-10-CM | POA: Diagnosis not present

## 2014-06-10 DIAGNOSIS — R001 Bradycardia, unspecified: Secondary | ICD-10-CM | POA: Diagnosis not present

## 2014-06-10 DIAGNOSIS — I959 Hypotension, unspecified: Secondary | ICD-10-CM | POA: Diagnosis not present

## 2014-06-10 DIAGNOSIS — M19019 Primary osteoarthritis, unspecified shoulder: Secondary | ICD-10-CM | POA: Diagnosis not present

## 2014-06-10 MED ORDER — NYSTATIN 100000 UNIT/GM EX POWD: CUTANEOUS | Status: DC

## 2014-06-10 MED ORDER — NYSTATIN 100000 UNIT/GM EX CREA: 1.0000 "application " | TOPICAL_CREAM | Freq: Two times a day (BID) | CUTANEOUS | Status: DC

## 2014-06-10 NOTE — Telephone Encounter (Signed)
Order for nystatin powder done and printed .  Verbal authorization for UA and BMET given

## 2014-06-10 NOTE — Telephone Encounter (Signed)
Linda from [facility] called, Pt is having increased tiredness, "staggery", yeast rash under right breast.  [name] is asking with history of UTI okay for urine specimen and also a renal panel, and asking for antifungal medication for yeast rash.

## 2014-06-11 DIAGNOSIS — R32 Unspecified urinary incontinence: Secondary | ICD-10-CM | POA: Diagnosis not present

## 2014-06-11 DIAGNOSIS — R350 Frequency of micturition: Secondary | ICD-10-CM | POA: Diagnosis not present

## 2014-06-11 DIAGNOSIS — R001 Bradycardia, unspecified: Secondary | ICD-10-CM | POA: Diagnosis not present

## 2014-06-11 DIAGNOSIS — Z9981 Dependence on supplemental oxygen: Secondary | ICD-10-CM | POA: Diagnosis not present

## 2014-06-11 DIAGNOSIS — I959 Hypotension, unspecified: Secondary | ICD-10-CM | POA: Diagnosis not present

## 2014-06-11 DIAGNOSIS — M19019 Primary osteoarthritis, unspecified shoulder: Secondary | ICD-10-CM | POA: Diagnosis not present

## 2014-06-11 DIAGNOSIS — I509 Heart failure, unspecified: Secondary | ICD-10-CM | POA: Diagnosis not present

## 2014-06-11 NOTE — Telephone Encounter (Signed)
Notified [facility] nurse

## 2014-06-13 LAB — BASIC METABOLIC PANEL
BUN: 24 mg/dL — AB (ref 4–21)
Creatinine: 1.1 mg/dL (ref 0.5–1.1)
Glucose: 91 mg/dL
Potassium: 5.3 mmol/L (ref 3.4–5.3)
Sodium: 138 mmol/L (ref 137–147)

## 2014-06-16 ENCOUNTER — Telehealth: Payer: Self-pay | Admitting: Internal Medicine

## 2014-06-16 ENCOUNTER — Encounter: Payer: Self-pay | Admitting: *Deleted

## 2014-06-16 NOTE — Telephone Encounter (Signed)
Urine culture from [facility] showed mixed bacteria in the urine. I would like to repeat a urine culture here if possible.  Labs also showed mild elevation of potassium. I would like to repeat a BMP here this week.

## 2014-06-17 ENCOUNTER — Other Ambulatory Visit (INDEPENDENT_AMBULATORY_CARE_PROVIDER_SITE_OTHER): Payer: Medicare Other

## 2014-06-17 ENCOUNTER — Telehealth: Payer: Self-pay | Admitting: *Deleted

## 2014-06-17 ENCOUNTER — Other Ambulatory Visit: Payer: Self-pay | Admitting: *Deleted

## 2014-06-17 DIAGNOSIS — R300 Dysuria: Secondary | ICD-10-CM

## 2014-06-17 DIAGNOSIS — E875 Hyperkalemia: Secondary | ICD-10-CM | POA: Diagnosis not present

## 2014-06-17 LAB — BASIC METABOLIC PANEL
BUN: 23 mg/dL (ref 6–23)
CALCIUM: 9.6 mg/dL (ref 8.4–10.5)
CHLORIDE: 106 meq/L (ref 96–112)
CO2: 26 mEq/L (ref 19–32)
CREATININE: 1.02 mg/dL (ref 0.40–1.20)
GFR: 54.01 mL/min — ABNORMAL LOW (ref >60.00)
Glucose, Bld: 91 mg/dL (ref 70–99)
POTASSIUM: 4.4 meq/L (ref 3.5–5.1)
Sodium: 140 mEq/L (ref 135–145)

## 2014-06-17 LAB — URINALYSIS, ROUTINE W REFLEX MICROSCOPIC
Bilirubin Urine: NEGATIVE
HGB URINE DIPSTICK: NEGATIVE
Ketones, ur: NEGATIVE
NITRITE: NEGATIVE
RBC / HPF: NONE SEEN (ref 0–?)
Total Protein, Urine: NEGATIVE
URINE GLUCOSE: NEGATIVE
UROBILINOGEN UA: 0.2 (ref 0.0–1.0)
pH: 5.5 (ref 5.0–8.0)

## 2014-06-17 NOTE — Telephone Encounter (Signed)
Scheduled pt to come to the office today for repeat labs and UA

## 2014-06-17 NOTE — Telephone Encounter (Signed)
What labs and dx?  

## 2014-06-17 NOTE — Telephone Encounter (Signed)
BMP for hyperkalemia.. Urinalysis and culture for UTI

## 2014-06-18 LAB — URINE CULTURE: Colony Count: >=100000

## 2014-06-19 ENCOUNTER — Other Ambulatory Visit: Payer: Self-pay | Admitting: *Deleted

## 2014-06-19 DIAGNOSIS — R32 Unspecified urinary incontinence: Secondary | ICD-10-CM | POA: Diagnosis not present

## 2014-06-19 DIAGNOSIS — I509 Heart failure, unspecified: Secondary | ICD-10-CM | POA: Diagnosis not present

## 2014-06-19 DIAGNOSIS — I959 Hypotension, unspecified: Secondary | ICD-10-CM | POA: Diagnosis not present

## 2014-06-19 DIAGNOSIS — Z139 Encounter for screening, unspecified: Secondary | ICD-10-CM | POA: Diagnosis not present

## 2014-06-19 DIAGNOSIS — Z9981 Dependence on supplemental oxygen: Secondary | ICD-10-CM | POA: Diagnosis not present

## 2014-06-19 DIAGNOSIS — M19019 Primary osteoarthritis, unspecified shoulder: Secondary | ICD-10-CM | POA: Diagnosis not present

## 2014-06-19 DIAGNOSIS — R001 Bradycardia, unspecified: Secondary | ICD-10-CM | POA: Diagnosis not present

## 2014-06-23 LAB — CULTURE, URINE COMPREHENSIVE: Colony Count: 50000

## 2014-06-24 ENCOUNTER — Encounter: Payer: Self-pay | Admitting: Internal Medicine

## 2014-06-24 DIAGNOSIS — M19019 Primary osteoarthritis, unspecified shoulder: Secondary | ICD-10-CM | POA: Diagnosis not present

## 2014-06-24 DIAGNOSIS — R001 Bradycardia, unspecified: Secondary | ICD-10-CM | POA: Diagnosis not present

## 2014-06-24 DIAGNOSIS — I509 Heart failure, unspecified: Secondary | ICD-10-CM | POA: Diagnosis not present

## 2014-06-24 DIAGNOSIS — R32 Unspecified urinary incontinence: Secondary | ICD-10-CM | POA: Diagnosis not present

## 2014-06-24 DIAGNOSIS — Z9981 Dependence on supplemental oxygen: Secondary | ICD-10-CM | POA: Diagnosis not present

## 2014-06-24 DIAGNOSIS — I959 Hypotension, unspecified: Secondary | ICD-10-CM | POA: Diagnosis not present

## 2014-06-30 DIAGNOSIS — Z9981 Dependence on supplemental oxygen: Secondary | ICD-10-CM | POA: Diagnosis not present

## 2014-06-30 DIAGNOSIS — I509 Heart failure, unspecified: Secondary | ICD-10-CM | POA: Diagnosis not present

## 2014-06-30 DIAGNOSIS — R32 Unspecified urinary incontinence: Secondary | ICD-10-CM | POA: Diagnosis not present

## 2014-06-30 DIAGNOSIS — M19019 Primary osteoarthritis, unspecified shoulder: Secondary | ICD-10-CM | POA: Diagnosis not present

## 2014-06-30 DIAGNOSIS — I959 Hypotension, unspecified: Secondary | ICD-10-CM | POA: Diagnosis not present

## 2014-06-30 DIAGNOSIS — R001 Bradycardia, unspecified: Secondary | ICD-10-CM | POA: Diagnosis not present

## 2014-07-08 ENCOUNTER — Telehealth: Payer: Self-pay | Admitting: *Deleted

## 2014-07-08 ENCOUNTER — Other Ambulatory Visit: Payer: Self-pay | Admitting: *Deleted

## 2014-07-08 ENCOUNTER — Other Ambulatory Visit: Payer: Self-pay | Admitting: Internal Medicine

## 2014-07-08 NOTE — Telephone Encounter (Signed)
Fax from [facility], needing PA for Premarin. Started online, pending response.

## 2014-07-09 ENCOUNTER — Telehealth: Payer: Self-pay | Admitting: Internal Medicine

## 2014-07-09 NOTE — Telephone Encounter (Signed)
Bryan from [facility] called states Premarin 0.3 mg is not covered by insurance.  States Estradiol is covered however another PA will need to be sent for the Estradiol.  Further states form has been faxed back for completion.  Please advise

## 2014-07-10 NOTE — Telephone Encounter (Signed)
I would recommend that she try coming off oral estrogens to see if any recurrent symptoms of hot flashes.

## 2014-07-10 NOTE — Telephone Encounter (Signed)
Spoke with pt, advised of MDs message.  Pt verbalized understanding 

## 2014-07-11 NOTE — Telephone Encounter (Signed)
PA for premarin was denied.

## 2014-07-14 DIAGNOSIS — R32 Unspecified urinary incontinence: Secondary | ICD-10-CM | POA: Diagnosis not present

## 2014-07-14 DIAGNOSIS — Z9981 Dependence on supplemental oxygen: Secondary | ICD-10-CM | POA: Diagnosis not present

## 2014-07-14 DIAGNOSIS — I959 Hypotension, unspecified: Secondary | ICD-10-CM | POA: Diagnosis not present

## 2014-07-14 DIAGNOSIS — R001 Bradycardia, unspecified: Secondary | ICD-10-CM | POA: Diagnosis not present

## 2014-07-14 DIAGNOSIS — M19019 Primary osteoarthritis, unspecified shoulder: Secondary | ICD-10-CM | POA: Diagnosis not present

## 2014-07-14 DIAGNOSIS — I509 Heart failure, unspecified: Secondary | ICD-10-CM | POA: Diagnosis not present

## 2014-07-16 ENCOUNTER — Encounter: Payer: Self-pay | Admitting: Internal Medicine

## 2014-07-16 ENCOUNTER — Ambulatory Visit (INDEPENDENT_AMBULATORY_CARE_PROVIDER_SITE_OTHER): Payer: Medicare Other | Admitting: Internal Medicine

## 2014-07-16 VITALS — BP 135/70 | HR 58 | Temp 97.9°F | Ht 63.5 in | Wt 189.1 lb

## 2014-07-16 DIAGNOSIS — M549 Dorsalgia, unspecified: Secondary | ICD-10-CM | POA: Diagnosis not present

## 2014-07-16 DIAGNOSIS — R319 Hematuria, unspecified: Secondary | ICD-10-CM | POA: Diagnosis not present

## 2014-07-16 DIAGNOSIS — N183 Chronic kidney disease, stage 3 unspecified: Secondary | ICD-10-CM

## 2014-07-16 DIAGNOSIS — N390 Urinary tract infection, site not specified: Secondary | ICD-10-CM | POA: Diagnosis not present

## 2014-07-16 DIAGNOSIS — G8929 Other chronic pain: Secondary | ICD-10-CM

## 2014-07-16 DIAGNOSIS — Z139 Encounter for screening, unspecified: Secondary | ICD-10-CM | POA: Diagnosis not present

## 2014-07-16 DIAGNOSIS — I10 Essential (primary) hypertension: Secondary | ICD-10-CM | POA: Diagnosis not present

## 2014-07-16 DIAGNOSIS — R6889 Other general symptoms and signs: Secondary | ICD-10-CM

## 2014-07-16 LAB — COMPREHENSIVE METABOLIC PANEL
ALBUMIN: 3.6 g/dL (ref 3.5–5.2)
ALT: 13 U/L (ref 0–35)
AST: 23 U/L (ref 0–37)
Alkaline Phosphatase: 64 U/L (ref 39–117)
BUN: 27 mg/dL — AB (ref 6–23)
CALCIUM: 9.4 mg/dL (ref 8.4–10.5)
CHLORIDE: 105 meq/L (ref 96–112)
CO2: 25 mEq/L (ref 19–32)
CREATININE: 1.06 mg/dL (ref 0.40–1.20)
GFR: 51.65 mL/min — ABNORMAL LOW (ref >60.00)
GLUCOSE: 95 mg/dL (ref 70–99)
Potassium: 4.6 mEq/L (ref 3.5–5.1)
Sodium: 137 mEq/L (ref 135–145)
Total Bilirubin: 0.3 mg/dL (ref 0.2–1.2)
Total Protein: 7.1 g/dL (ref 6.0–8.3)

## 2014-07-16 LAB — POCT URINALYSIS DIPSTICK
Bilirubin, UA: NEGATIVE
Glucose, UA: NEGATIVE
NITRITE UA: POSITIVE
PH UA: 5.5
PROTEIN UA: NEGATIVE
Spec Grav, UA: 1.02
Urobilinogen, UA: 0.2

## 2014-07-16 LAB — LIPID PANEL
Cholesterol: 214 mg/dL — ABNORMAL HIGH (ref 0–200)
HDL: 48.8 mg/dL (ref >39.00)
NONHDL: 165.2
Total CHOL/HDL Ratio: 4
Triglycerides: 207 mg/dL — ABNORMAL HIGH (ref 0.0–149.0)
VLDL: 41.4 mg/dL — ABNORMAL HIGH (ref 0.0–40.0)

## 2014-07-16 LAB — LDL CHOLESTEROL, DIRECT: Direct LDL: 135 mg/dL

## 2014-07-16 LAB — TSH: TSH: 3.15 u[IU]/mL (ref 0.35–4.50)

## 2014-07-16 MED ORDER — CIPROFLOXACIN HCL 250 MG PO TABS: 250.0000 mg | ORAL_TABLET | Freq: Two times a day (BID) | ORAL | Status: DC

## 2014-07-16 NOTE — Progress Notes (Signed)
Subjective:    Patient ID: Cindy Graham, female    DOB: [birth year], 81 y.o.   MRN: [id number]  HPI  81YO female presents for follow up.  Over last few days, has noted foul smell to urine and inability to empty bladder. Notes slight decrease in UOP. Trying to increase fluid intake. No fever, chills, flank pain.  Aside from this, feeling well. Back pain has been very well controlled with Nucynta, however her insurance will no longer cover it. She is working with Dr. Sharlet Salina to decide on next treatment option.  She stopped taking Premarin 1 week ago, but has not noticed any change with this. She has felt hot when others feel comfortable for months, even when on Premarin.   Past medical, surgical, family and social history per today's encounter.  Review of Systems  Constitutional: Negative for fever, chills, appetite change, fatigue and unexpected weight change.  Eyes: Negative for visual disturbance.  Respiratory: Negative for shortness of breath.   Cardiovascular: Negative for chest pain and leg swelling.  Gastrointestinal: Negative for abdominal pain, diarrhea and constipation.  Endocrine: Positive for heat intolerance.  Genitourinary: Positive for difficulty urinating. Negative for dysuria, urgency, frequency, flank pain and pelvic pain.  Musculoskeletal: Positive for myalgias, back pain and arthralgias.  Skin: Negative for color change and rash.  Hematological: Negative for adenopathy. Does not bruise/bleed easily.  Psychiatric/Behavioral: Negative for dysphoric mood. The patient is not nervous/anxious.        Objective:    BP 135/70 mmHg  Pulse 58  Temp(Src) 97.9 F (36.6 C) (Oral)  Ht 5' 3.5" (1.613 m)  Wt 189 lb 2 oz (85.787 kg)  BMI 32.97 kg/m2  SpO2 95% Physical Exam  Constitutional: She is oriented to person, place, and time. She appears well-developed and well-nourished. No distress.  HENT:  Head: Normocephalic and atraumatic.  Right Ear: External ear normal.   Left Ear: External ear normal.  Nose: Nose normal.  Mouth/Throat: Oropharynx is clear and moist. No oropharyngeal exudate.  Eyes: Conjunctivae are normal. Pupils are equal, round, and reactive to light. Right eye exhibits no discharge. Left eye exhibits no discharge. No scleral icterus.  Neck: Normal range of motion. Neck supple. No tracheal deviation present. No thyromegaly present.  Cardiovascular: Normal rate, regular rhythm, normal heart sounds and intact distal pulses.  Exam reveals no gallop and no friction rub.   No murmur heard. Pulmonary/Chest: Effort normal and breath sounds normal. No respiratory distress. She has no wheezes. She has no rales. She exhibits no tenderness.  Abdominal: Tenderness: no CVA tenderness.  Musculoskeletal: Normal range of motion. She exhibits no edema or tenderness.  Lymphadenopathy:    She has no cervical adenopathy.  Neurological: She is alert and oriented to person, place, and time. No cranial nerve deficit. She exhibits normal muscle tone. Coordination normal.  Skin: Skin is warm and dry. No rash noted. She is not diaphoretic. No erythema. No pallor.  Psychiatric: She has a normal mood and affect. Her behavior is normal. Judgment and thought content normal.          Assessment & Plan:   Problem List Items Addressed This Visit      Unprioritized   Chronic back pain greater than 3 months duration    Excellent control of pain symptoms with Nucynta. Will continue for now. Continue to follow with pain management.      Chronic kidney disease (CKD), stage III (moderate)    Will check renal function with labs.  Heat intolerance    Longstanding heat intolerance. Will check TSH with labs.      Relevant Orders   TSH   Hypertension    BP Readings from Last 3 Encounters:  07/16/14 135/70  02/20/14 128/58  01/15/14 136/60   BP well controlled. Renal function with labs today. Continue current medications.      Relevant Orders    Comprehensive metabolic panel   Lipid panel   Infection of urinary tract - Primary    UA consistent with UTI. Will start Cipro. Send urine for culture. Encouraged increased fluid intake. Follow up if symptoms are not improving.      Relevant Orders   POCT urinalysis dipstick (Completed)   Urine culture    Other Visit Diagnoses    Screening        Relevant Orders    POCT urinalysis dipstick (Completed)    Urine culture        Return in about 3 months (around 10/14/2014).

## 2014-07-16 NOTE — Assessment & Plan Note (Signed)
Will check renal function with labs. 

## 2014-07-16 NOTE — Progress Notes (Signed)
Pre visit review using our clinic review tool, if applicable. No additional management support is needed unless otherwise documented below in the visit note. 

## 2014-07-16 NOTE — Assessment & Plan Note (Signed)
BP Readings from Last 3 Encounters:  07/16/14 135/70  02/20/14 128/58  01/15/14 136/60   BP well controlled. Renal function with labs today. Continue current medications.

## 2014-07-16 NOTE — Assessment & Plan Note (Signed)
Excellent control of pain symptoms with Nucynta. Will continue for now. Continue to follow with pain management.

## 2014-07-16 NOTE — Patient Instructions (Signed)
Start Cipro 250mg  twice daily for urinary tract infection.  Labs today.  Follow up in 4 weeks.

## 2014-07-16 NOTE — Assessment & Plan Note (Signed)
UA consistent with UTI. Will start Cipro. Send urine for culture. Encouraged increased fluid intake. Follow up if symptoms are not improving.

## 2014-07-16 NOTE — Assessment & Plan Note (Signed)
Longstanding heat intolerance. Will check TSH with labs.

## 2014-07-17 ENCOUNTER — Encounter: Payer: Self-pay | Admitting: *Deleted

## 2014-07-19 LAB — URINE CULTURE: Colony Count: >=100000

## 2014-07-21 DIAGNOSIS — R001 Bradycardia, unspecified: Secondary | ICD-10-CM | POA: Diagnosis not present

## 2014-07-21 DIAGNOSIS — I959 Hypotension, unspecified: Secondary | ICD-10-CM | POA: Diagnosis not present

## 2014-07-21 DIAGNOSIS — M19019 Primary osteoarthritis, unspecified shoulder: Secondary | ICD-10-CM | POA: Diagnosis not present

## 2014-07-21 DIAGNOSIS — Z9981 Dependence on supplemental oxygen: Secondary | ICD-10-CM | POA: Diagnosis not present

## 2014-07-21 DIAGNOSIS — R32 Unspecified urinary incontinence: Secondary | ICD-10-CM | POA: Diagnosis not present

## 2014-07-21 DIAGNOSIS — I509 Heart failure, unspecified: Secondary | ICD-10-CM | POA: Diagnosis not present

## 2014-07-29 DIAGNOSIS — Z9981 Dependence on supplemental oxygen: Secondary | ICD-10-CM | POA: Diagnosis not present

## 2014-07-29 DIAGNOSIS — R001 Bradycardia, unspecified: Secondary | ICD-10-CM | POA: Diagnosis not present

## 2014-07-29 DIAGNOSIS — I959 Hypotension, unspecified: Secondary | ICD-10-CM | POA: Diagnosis not present

## 2014-07-29 DIAGNOSIS — I509 Heart failure, unspecified: Secondary | ICD-10-CM | POA: Diagnosis not present

## 2014-07-29 DIAGNOSIS — R32 Unspecified urinary incontinence: Secondary | ICD-10-CM | POA: Diagnosis not present

## 2014-07-29 DIAGNOSIS — M19019 Primary osteoarthritis, unspecified shoulder: Secondary | ICD-10-CM | POA: Diagnosis not present

## 2014-08-03 DIAGNOSIS — I509 Heart failure, unspecified: Secondary | ICD-10-CM | POA: Diagnosis not present

## 2014-08-03 DIAGNOSIS — R001 Bradycardia, unspecified: Secondary | ICD-10-CM | POA: Diagnosis not present

## 2014-08-03 DIAGNOSIS — N390 Urinary tract infection, site not specified: Secondary | ICD-10-CM | POA: Diagnosis not present

## 2014-08-03 DIAGNOSIS — R32 Unspecified urinary incontinence: Secondary | ICD-10-CM | POA: Diagnosis not present

## 2014-08-03 DIAGNOSIS — I959 Hypotension, unspecified: Secondary | ICD-10-CM | POA: Diagnosis not present

## 2014-08-03 DIAGNOSIS — Z9981 Dependence on supplemental oxygen: Secondary | ICD-10-CM | POA: Diagnosis not present

## 2014-08-03 DIAGNOSIS — M19019 Primary osteoarthritis, unspecified shoulder: Secondary | ICD-10-CM | POA: Diagnosis not present

## 2014-08-05 DIAGNOSIS — I509 Heart failure, unspecified: Secondary | ICD-10-CM | POA: Diagnosis not present

## 2014-08-05 DIAGNOSIS — R001 Bradycardia, unspecified: Secondary | ICD-10-CM | POA: Diagnosis not present

## 2014-08-05 DIAGNOSIS — R32 Unspecified urinary incontinence: Secondary | ICD-10-CM | POA: Diagnosis not present

## 2014-08-05 DIAGNOSIS — N390 Urinary tract infection, site not specified: Secondary | ICD-10-CM | POA: Diagnosis not present

## 2014-08-05 DIAGNOSIS — I959 Hypotension, unspecified: Secondary | ICD-10-CM | POA: Diagnosis not present

## 2014-08-05 DIAGNOSIS — M19019 Primary osteoarthritis, unspecified shoulder: Secondary | ICD-10-CM | POA: Diagnosis not present

## 2014-08-07 ENCOUNTER — Other Ambulatory Visit: Payer: Self-pay | Admitting: Internal Medicine

## 2014-08-11 DIAGNOSIS — I959 Hypotension, unspecified: Secondary | ICD-10-CM | POA: Diagnosis not present

## 2014-08-11 DIAGNOSIS — R001 Bradycardia, unspecified: Secondary | ICD-10-CM | POA: Diagnosis not present

## 2014-08-11 DIAGNOSIS — M19019 Primary osteoarthritis, unspecified shoulder: Secondary | ICD-10-CM | POA: Diagnosis not present

## 2014-08-11 DIAGNOSIS — N390 Urinary tract infection, site not specified: Secondary | ICD-10-CM | POA: Diagnosis not present

## 2014-08-11 DIAGNOSIS — R32 Unspecified urinary incontinence: Secondary | ICD-10-CM | POA: Diagnosis not present

## 2014-08-11 DIAGNOSIS — I509 Heart failure, unspecified: Secondary | ICD-10-CM | POA: Diagnosis not present

## 2014-08-19 DIAGNOSIS — R001 Bradycardia, unspecified: Secondary | ICD-10-CM | POA: Diagnosis not present

## 2014-08-19 DIAGNOSIS — I509 Heart failure, unspecified: Secondary | ICD-10-CM | POA: Diagnosis not present

## 2014-08-19 DIAGNOSIS — I959 Hypotension, unspecified: Secondary | ICD-10-CM | POA: Diagnosis not present

## 2014-08-19 DIAGNOSIS — R32 Unspecified urinary incontinence: Secondary | ICD-10-CM | POA: Diagnosis not present

## 2014-08-19 DIAGNOSIS — N390 Urinary tract infection, site not specified: Secondary | ICD-10-CM | POA: Diagnosis not present

## 2014-08-19 DIAGNOSIS — M19019 Primary osteoarthritis, unspecified shoulder: Secondary | ICD-10-CM | POA: Diagnosis not present

## 2014-08-26 ENCOUNTER — Telehealth: Payer: Self-pay | Admitting: *Deleted

## 2014-08-26 DIAGNOSIS — R5383 Other fatigue: Secondary | ICD-10-CM | POA: Diagnosis not present

## 2014-08-26 DIAGNOSIS — N390 Urinary tract infection, site not specified: Secondary | ICD-10-CM | POA: Diagnosis not present

## 2014-08-26 DIAGNOSIS — R001 Bradycardia, unspecified: Secondary | ICD-10-CM | POA: Diagnosis not present

## 2014-08-26 DIAGNOSIS — R350 Frequency of micturition: Secondary | ICD-10-CM | POA: Diagnosis not present

## 2014-08-26 DIAGNOSIS — R32 Unspecified urinary incontinence: Secondary | ICD-10-CM | POA: Diagnosis not present

## 2014-08-26 DIAGNOSIS — I509 Heart failure, unspecified: Secondary | ICD-10-CM | POA: Diagnosis not present

## 2014-08-26 DIAGNOSIS — I959 Hypotension, unspecified: Secondary | ICD-10-CM | POA: Diagnosis not present

## 2014-08-26 DIAGNOSIS — M19019 Primary osteoarthritis, unspecified shoulder: Secondary | ICD-10-CM | POA: Diagnosis not present

## 2014-08-26 NOTE — Telephone Encounter (Signed)
Linda notified to collect UA and culture

## 2014-08-26 NOTE — Telephone Encounter (Signed)
Spoke with [name] she states pts O2 was 88% without oxygen, further states pt only wears O2 at night and is on 2 liters at that time.  Due to low O2 sats today [name] placed pt on 2 liters and advised pt to wear it continually for 24 hours and sats came up to 96%-97% on the O2.  Requesting whether pt needs to be seen in ER still, pt does not want to go to ER.  Requesting whether chest Xray can be performed tonight and pt be seen in office tomorrow.  Please advise

## 2014-08-26 NOTE — Telephone Encounter (Signed)
Linda with [facility] called, will be seeing pt in about an hour. Pt had called her early to inform her of urinary odor and generalized not feeling well, would like to have order to collect urine specimen when [name] gets there. Ok?

## 2014-08-26 NOTE — Telephone Encounter (Signed)
That is fine. CXR tonight. Must be seen in office tomorrow. This will need to be with Morey Hummingbird as my schedule is full.

## 2014-08-26 NOTE — Telephone Encounter (Signed)
That is fine 

## 2014-08-26 NOTE — Telephone Encounter (Signed)
[name] called states during her visit today pts O2 at rest was 88% it is normally 96-98%.  Pt is having increased fatigue and increased edema.  [name] states she is putting pt on continuous O2 and is requesting a mobile chest Xray today.  Please advise

## 2014-08-26 NOTE — Telephone Encounter (Signed)
Spoke with [name] and the pt advised of chest xray order and pts f/u appoint on Friday 4.8.16 at 10:30am with Dr Gilford Rile.  Appointment given to [facility] to schedule.

## 2014-08-26 NOTE — Telephone Encounter (Signed)
She should be evaluated in the ED if oxygen level 88% on supplemental oxygen.

## 2014-08-27 DIAGNOSIS — M19019 Primary osteoarthritis, unspecified shoulder: Secondary | ICD-10-CM | POA: Diagnosis not present

## 2014-08-27 DIAGNOSIS — R001 Bradycardia, unspecified: Secondary | ICD-10-CM | POA: Diagnosis not present

## 2014-08-27 DIAGNOSIS — N390 Urinary tract infection, site not specified: Secondary | ICD-10-CM | POA: Diagnosis not present

## 2014-08-27 DIAGNOSIS — R0902 Hypoxemia: Secondary | ICD-10-CM | POA: Diagnosis not present

## 2014-08-27 DIAGNOSIS — R32 Unspecified urinary incontinence: Secondary | ICD-10-CM | POA: Diagnosis not present

## 2014-08-27 DIAGNOSIS — I509 Heart failure, unspecified: Secondary | ICD-10-CM | POA: Diagnosis not present

## 2014-08-27 DIAGNOSIS — I959 Hypotension, unspecified: Secondary | ICD-10-CM | POA: Diagnosis not present

## 2014-08-28 ENCOUNTER — Telehealth: Payer: Self-pay | Admitting: *Deleted

## 2014-08-28 ENCOUNTER — Other Ambulatory Visit: Payer: Self-pay | Admitting: *Deleted

## 2014-08-28 ENCOUNTER — Telehealth: Payer: Self-pay | Admitting: Internal Medicine

## 2014-08-28 MED ORDER — LEVOFLOXACIN 250 MG PO TABS: 250.0000 mg | ORAL_TABLET | Freq: Every day | ORAL | Status: DC

## 2014-08-28 NOTE — Telephone Encounter (Signed)
Family member called states they have been waiting over an hour for antibiotic to be sent to pharmacy.  Family member wanted to remain on the phone until process was completed. Rx had been printed instead of sent electronically. Verified with Dr Gilford Rile the directions of the Rx then called pharmacy, verified whether Rx was called in and it had not.  Gave pharmacist verbal.  Family member notified

## 2014-08-28 NOTE — Telephone Encounter (Signed)
CXR ordered by home health nurse showed RLL pneumonia.  Urinalysis also showed UTI. If she is having persistent symptoms of dyspnea and/or low oxygen saturations then needs to be evaluated in the ED today. If not, we may be able to start antibiotics as an outpatient. Please let me know how she is.

## 2014-08-28 NOTE — Telephone Encounter (Signed)
Notified home health nurse

## 2014-08-28 NOTE — Telephone Encounter (Signed)
Notified pt. 

## 2014-08-29 ENCOUNTER — Ambulatory Visit (INDEPENDENT_AMBULATORY_CARE_PROVIDER_SITE_OTHER): Payer: Medicare Other | Admitting: Internal Medicine

## 2014-08-29 ENCOUNTER — Encounter: Payer: Self-pay | Admitting: Internal Medicine

## 2014-08-29 VITALS — BP 105/64 | HR 68 | Temp 98.1°F | Ht 63.5 in | Wt 188.0 lb

## 2014-08-29 DIAGNOSIS — J189 Pneumonia, unspecified organism: Secondary | ICD-10-CM | POA: Diagnosis not present

## 2014-08-29 DIAGNOSIS — E538 Deficiency of other specified B group vitamins: Secondary | ICD-10-CM | POA: Diagnosis not present

## 2014-08-29 DIAGNOSIS — I10 Essential (primary) hypertension: Secondary | ICD-10-CM

## 2014-08-29 DIAGNOSIS — N3000 Acute cystitis without hematuria: Secondary | ICD-10-CM

## 2014-08-29 LAB — COMPREHENSIVE METABOLIC PANEL
ALT: 12 U/L (ref 0–35)
AST: 22 U/L (ref 0–37)
Albumin: 3.5 g/dL (ref 3.5–5.2)
Alkaline Phosphatase: 56 U/L (ref 39–117)
BILIRUBIN TOTAL: 0.4 mg/dL (ref 0.2–1.2)
BUN: 29 mg/dL — AB (ref 6–23)
CO2: 25 meq/L (ref 19–32)
Calcium: 9.8 mg/dL (ref 8.4–10.5)
Chloride: 100 mEq/L (ref 96–112)
Creatinine, Ser: 1.28 mg/dL — ABNORMAL HIGH (ref 0.40–1.20)
GFR: 41.54 mL/min — AB (ref >60.00)
GLUCOSE: 108 mg/dL — AB (ref 70–99)
Potassium: 4.4 mEq/L (ref 3.5–5.1)
Sodium: 135 mEq/L (ref 135–145)
Total Protein: 7.1 g/dL (ref 6.0–8.3)

## 2014-08-29 LAB — CBC WITH DIFFERENTIAL/PLATELET
BASOS PCT: 0.5 % (ref 0.0–3.0)
Basophils Absolute: 0 10*3/uL (ref 0.0–0.1)
EOS PCT: 6.3 % — AB (ref 0.0–5.0)
Eosinophils Absolute: 0.4 10*3/uL (ref 0.0–0.7)
HCT: 38 % (ref 36.0–46.0)
HEMOGLOBIN: 12.8 g/dL (ref 12.0–15.0)
Lymphocytes Relative: 29.2 % (ref 12.0–46.0)
Lymphs Abs: 2.1 10*3/uL (ref 0.7–4.0)
MCHC: 33.6 g/dL (ref 30.0–36.0)
MCV: 94.5 fl (ref 78.0–100.0)
MONO ABS: 0.7 10*3/uL (ref 0.1–1.0)
Monocytes Relative: 9.9 % (ref 3.0–12.0)
Neutro Abs: 3.8 10*3/uL (ref 1.4–7.7)
Neutrophils Relative %: 54.1 % (ref 43.0–77.0)
Platelets: 245 10*3/uL (ref 150.0–400.0)
RBC: 4.02 Mil/uL (ref 3.87–5.11)
RDW: 14.1 % (ref 11.5–15.5)
WBC: 7.1 10*3/uL (ref 4.0–10.5)

## 2014-08-29 LAB — VITAMIN B12: VITAMIN B 12: 1241 pg/mL — AB (ref 211–911)

## 2014-08-29 MED ORDER — CYANOCOBALAMIN 1000 MCG/ML IJ SOLN
1000.0000 ug | Freq: Once | INTRAMUSCULAR | Status: AC
  Administered 2014-08-29: 1000 ug via INTRAMUSCULAR

## 2014-08-29 NOTE — Progress Notes (Signed)
Subjective:    Patient ID: Cindy Graham, female    DOB: [birth year], 81 y.o.   MRN: [id number]  HPI  81YO female presents for acute visit.  Pneumonia - Feeling more weak and short of breath with exertion. Coughing some this morning. Cough is not productive. CXR showed RLL pneumonia. No chest pain. Started Levaquin yesterday.  Dysuria - Urine also had bad odor recently. No burning with urination. UA was pos for blood and leuk, but urine culture was negative. Symptoms improved over last 24hr.   Past medical, surgical, family and social history per today's encounter.  Review of Systems  Constitutional: Positive for fatigue. Negative for fever, chills, appetite change and unexpected weight change.  Eyes: Negative for visual disturbance.  Respiratory: Positive for cough and shortness of breath. Negative for chest tightness and wheezing.   Cardiovascular: Negative for chest pain, palpitations and leg swelling.  Gastrointestinal: Negative for nausea, vomiting, abdominal pain, diarrhea and constipation.  Genitourinary: Positive for dysuria. Negative for urgency, frequency, hematuria, flank pain, genital sores and pelvic pain.  Skin: Negative for color change and rash.  Hematological: Negative for adenopathy. Does not bruise/bleed easily.  Psychiatric/Behavioral: Negative for dysphoric mood. The patient is not nervous/anxious.        Objective:    BP 105/64 mmHg  Pulse 68  Temp(Src) 98.1 F (36.7 C) (Oral)  Ht 5' 3.5" (1.613 m)  Wt 188 lb (85.276 kg)  BMI 32.78 kg/m2  SpO2 95% Physical Exam  Constitutional: She is oriented to person, place, and time. She appears well-developed and well-nourished. No distress.  HENT:  Head: Normocephalic and atraumatic.  Right Ear: External ear normal.  Left Ear: External ear normal.  Nose: Nose normal.  Mouth/Throat: Oropharynx is clear and moist. No oropharyngeal exudate.  Eyes: Conjunctivae are normal. Pupils are equal, round, and reactive  to light. Right eye exhibits no discharge. Left eye exhibits no discharge. No scleral icterus.  Neck: Normal range of motion. Neck supple. No tracheal deviation present. No thyromegaly present.  Cardiovascular: Normal rate, regular rhythm, normal heart sounds and intact distal pulses.  Exam reveals no gallop and no friction rub.   No murmur heard. Pulmonary/Chest: Effort normal. No accessory muscle usage. No tachypnea. No respiratory distress. She has no wheezes. She has rhonchi in the right lower field. She has no rales. She exhibits no tenderness.  Musculoskeletal: Normal range of motion. She exhibits no edema or tenderness.  Lymphadenopathy:    She has no cervical adenopathy.  Neurological: She is alert and oriented to person, place, and time. No cranial nerve deficit. She exhibits normal muscle tone. Coordination normal.  Skin: Skin is warm and dry. No rash noted. She is not diaphoretic. No erythema. No pallor.  Psychiatric: She has a normal mood and affect. Her behavior is normal. Judgment and thought content normal.          Assessment & Plan:   Problem List Items Addressed This Visit      Unprioritized   B12 deficiency    Will check B12 level with labs today.      Relevant Medications   cyanocobalamin ((VITAMIN B-12)) injection 1,000 mcg (Completed)   Other Relevant Orders   B12   CAP (community acquired pneumonia) - Primary    Symptoms, exam and CXR c/w RLL pneumonia. Started on Levaquin with plan for 10 day course. Repeat CXR in 3-4 weeks. Follow up in 4 weeks and prn. Encouraged use of incentive spirometer.  Relevant Orders   CBC w/Diff   Hypertension    BP Readings from Last 3 Encounters:  08/29/14 105/64  07/16/14 135/70  02/20/14 128/58   BP lower this visit. Will monitor closely. Last attempt at reducing BP meds, resulted in elevated BP. However may consider reducing Dyazide to 1/2 tab if symptomatic hypotension. Follow up in 4 weeks.      Relevant  Orders   Comprehensive metabolic panel   Infection of urinary tract    UA was concerning for UTI, but urine culture  Negative. Will follow. Currently on Levaquin for pneumonia.          Return in about 4 weeks (around 09/26/2014) for Recheck.

## 2014-08-29 NOTE — Patient Instructions (Signed)
Continue Levaquin to complete 10 day course for pneumonia.  Repeat chest xray in 3 weeks.  Follow up in 4 weeks or sooner as needed.

## 2014-08-29 NOTE — Assessment & Plan Note (Signed)
Symptoms, exam and CXR c/w RLL pneumonia. Started on Levaquin with plan for 10 day course. Repeat CXR in 3-4 weeks. Follow up in 4 weeks and prn. Encouraged use of incentive spirometer.

## 2014-08-29 NOTE — Assessment & Plan Note (Signed)
UA was concerning for UTI, but urine culture  Negative. Will follow. Currently on Levaquin for pneumonia.

## 2014-08-29 NOTE — Assessment & Plan Note (Signed)
Will check B12 level with labs today.

## 2014-08-29 NOTE — Progress Notes (Signed)
Pre visit review using our clinic review tool, if applicable. No additional management support is needed unless otherwise documented below in the visit note. 

## 2014-08-29 NOTE — Assessment & Plan Note (Signed)
BP Readings from Last 3 Encounters:  08/29/14 105/64  07/16/14 135/70  02/20/14 128/58   BP lower this visit. Will monitor closely. Last attempt at reducing BP meds, resulted in elevated BP. However may consider reducing Dyazide to 1/2 tab if symptomatic hypotension. Follow up in 4 weeks.

## 2014-09-01 ENCOUNTER — Encounter: Payer: Self-pay | Admitting: *Deleted

## 2014-09-01 ENCOUNTER — Other Ambulatory Visit: Payer: Self-pay | Admitting: *Deleted

## 2014-09-02 ENCOUNTER — Telehealth: Payer: Self-pay | Admitting: *Deleted

## 2014-09-02 DIAGNOSIS — I959 Hypotension, unspecified: Secondary | ICD-10-CM | POA: Diagnosis not present

## 2014-09-02 DIAGNOSIS — R32 Unspecified urinary incontinence: Secondary | ICD-10-CM | POA: Diagnosis not present

## 2014-09-02 DIAGNOSIS — R001 Bradycardia, unspecified: Secondary | ICD-10-CM | POA: Diagnosis not present

## 2014-09-02 DIAGNOSIS — I509 Heart failure, unspecified: Secondary | ICD-10-CM | POA: Diagnosis not present

## 2014-09-02 DIAGNOSIS — N390 Urinary tract infection, site not specified: Secondary | ICD-10-CM | POA: Diagnosis not present

## 2014-09-02 DIAGNOSIS — M19019 Primary osteoarthritis, unspecified shoulder: Secondary | ICD-10-CM | POA: Diagnosis not present

## 2014-09-02 NOTE — Telephone Encounter (Signed)
That is fine 

## 2014-09-02 NOTE — Telephone Encounter (Signed)
Notified Rip Harbour, She will order chest xray for 09/22/14

## 2014-09-02 NOTE — Telephone Encounter (Signed)
Rip Harbour, [facility] nurse, called asking if you wanted for her to order the mobile xray to come to pt home for her repeat chest xray, and asking for verbal authorization to provide pt with spirometer with teaching.

## 2014-09-05 ENCOUNTER — Other Ambulatory Visit: Payer: Self-pay | Admitting: Internal Medicine

## 2014-09-08 DIAGNOSIS — I509 Heart failure, unspecified: Secondary | ICD-10-CM | POA: Diagnosis not present

## 2014-09-08 DIAGNOSIS — N390 Urinary tract infection, site not specified: Secondary | ICD-10-CM | POA: Diagnosis not present

## 2014-09-08 DIAGNOSIS — I959 Hypotension, unspecified: Secondary | ICD-10-CM | POA: Diagnosis not present

## 2014-09-08 DIAGNOSIS — R32 Unspecified urinary incontinence: Secondary | ICD-10-CM | POA: Diagnosis not present

## 2014-09-08 DIAGNOSIS — M19019 Primary osteoarthritis, unspecified shoulder: Secondary | ICD-10-CM | POA: Diagnosis not present

## 2014-09-08 DIAGNOSIS — R001 Bradycardia, unspecified: Secondary | ICD-10-CM | POA: Diagnosis not present

## 2014-09-12 NOTE — Consult Note (Signed)
PATIENT NAME:  Cindy Graham, Cindy Graham MR#:  [id number] DATE OF BIRTH:  11-30-1921  DATE OF CONSULTATION:  03/15/2013  REFERRING PHYSICIAN: Dr. Ether Griffins.   CONSULTING PHYSICIAN:  Lucindy Borel A. Fletcher Anon, MD  PRIMARY CARE PHYSICIAN: Dr. Gilford Rile.   REASON FOR CONSULTATION: Chest pain.  HISTORY OF PRESENT ILLNESS: This is an 81 year old female with no previous cardiac history. She has chronic medical conditions that include hypertension, hyperlipidemia and arthritis. She was started on tramadol recently for hip discomfort. Shortly after taking the medication, she started feeling nauseous and ill with weakness. She thought that she was reacting to the medication. She went to the walk in clinic yesterday and reported mild chest discomfort. She was thus sent to the Emergency Room. She was noted to have ST changes in the septal leads and thus was admitted for observation. Serial cardiac enzymes are negative. Her blood pressure was elevated on presentation, above 993 systolic. She feels back to her baseline. She reports no recent exertional symptoms of chest pain or dyspnea.   PAST MEDICAL HISTORY: Hypertension, hyperlipidemia, back pain and asthma.   ALLERGIES: INCLUDE NEOSPORIN, PENICILLIN, SEPTRA AND TETRACYCLINE.   FAMILY HISTORY: Negative for premature coronary artery disease.   SOCIAL HISTORY: She is widowed and lives alone. There is no history of smoking, alcohol or recreational drug use.   REVIEW OF SYSTEMS: A 10-point. review of systems was performed. It is negative other than what is mentioned in the HPI.   PHYSICAL EXAMINATION:  GENERAL: The patient appears to be at her stated age, in no acute distress.  VITAL SIGNS: Temperature 98.5, pulse 61, respiratory rate 18, blood pressure 126/68, and oxygen saturation is 93% on 2 L nasal cannula.  HEENT: Normocephalic, atraumatic.  NECK: No JVD or carotid bruits.  RESPIRATORY: Normal respiratory effort with no use of accessory muscles. Auscultation reveals  normal breath sounds.  CARDIOVASCULAR: Normal PMI. Normal S1 and S2 with no gallops or murmurs.  ABDOMEN: Benign, nontender, and nondistended.  EXTREMITIES: No clubbing, cyanosis, or edema.  SKIN: Warm and dry with no rash.  PSYCHIATRIC: She is alert, oriented x 3 with normal mood and affect.   LABORATORY AND DIAGNOSTIC DATA: Cardiac enzymes were negative. ECG showed sinus rhythm with ST changes in V1 and V2. Urinalysis showed evidence of UTI with gram-negative rod.   IMPRESSION:  1.  Possible unstable angina with atypical symptoms.  2.  Uncontrolled hypertension on presentation.  3.  Urinary tract infection.  4.  Possible reaction to tramadol.   RECOMMENDATIONS: The patient is currently chest pain-free. She ruled out for myocardial infarction. I suspect that her presentation is multifactorial due to urinary tract infection, possible reaction to tramadol and uncontrolled hypertension. She reports no preceding symptoms suggestive of angina. Echocardiogram showed normal LV systolic function with no significant wall motion abnormalities. I discussed management options in detail with the patient and her family. She prefers conservative management and does not want to pursue ischemic cardiac evaluation at this point, which I think is reasonable. I recommend treating her urinary tract infection and re-evaluating her symptoms in a few weeks. The patient can be discharged home from a cardiac standpoint.  ____________________________ Mertie Clause. Fletcher Anon, MD maa:aw D: 03/15/2013 13:34:06 ET T: 03/15/2013 14:05:16 ET JOB#: [id number]  cc: Keiden Deskin A. Fletcher Anon, MD, <Dictator> Theodoro Grist, MD DR. Dyane Dustman MD ELECTRONICALLY SIGNED 03/18/2013 9:01

## 2014-09-12 NOTE — H&P (Signed)
PATIENT NAME:  Cindy Graham, Cindy Graham MR#:  [id number] DATE OF BIRTH:  May 04, 1922  DATE OF ADMISSION:  03/14/2013  PRIMARY CARE PHYSICIAN:  Ronette Deter, MD   HISTORY OF PRESENT ILLNESS:  The patient is an 81 year old Caucasian female with past medical history significant for history of hypertension, hyperlipidemia, history of heart murmur as well as back pain, who presents to the hospital with complaints of midsternal chest pains. According to the patient, she was doing well up until probably 2 weeks ago, when she started having problems with worsening back pain. She also started having left thigh numbness. She presented to the Emergency Room on 03/12/2013. She was given tramadol and was discharged home. Yesterday, she took some tramadol because of the pain, and she just did not feel right. Today in the morning, while she was sitting, she started having midsternal chest pains. The pain was described as uncomfortable, probably 3/10 intensity pain, intermittent, but would last only a few minutes. It had no radiation or significant change with moving around or deep breathing. She felt nauseous, sick, but did not feel presyncopal. She, however, admitted to having her heart beating fast. She denies any significant shortness of breath. She became somewhat  really uncomfortable and so nauseated that she actually threw up, vomited x1 at around 1 p.m. earlier today. After vomiting, she started having heartburn, which was on and off. Also, she was burping and just did not feel well. She did not eat much and she did not take her medications. She decided to come to Emergency Room for further evaluation. In the Emergency Room, she was noted to have malignant hypertension, with systolic blood pressure above 200s, and hospitalist services were contacted for admission. Her EKG also showed some changes, ST depressions in septal leads. The patient denies any cardiac history in the past.   PAST MEDICAL HISTORY: Significant for  history of hypertension, hyperlipidemia, heart murmur, as well as back pain. Also significant for asthma.  PAST SURGICAL HISTORY: Significant for gallbladder surgery, hysterectomy, umbilical hernia repair, as well as melanoma operation 3 years ago, bilateral cataract removal.  ALLERGIES: The patient is allergic to a few medications, including NEOSPORIN, PENICILLIN, SEPTRA, as well as TETRACYCLINE.   FAMILY HISTORY: Significant for patient's father who died of CVA at the age of 32. He also had MI in the past. No early coronary artery disease. No diabetes. Cancer in multiple families. Granddaughter had breast cancer. Numerous colon cancers in the family.   SOCIAL HISTORY: The patient is widowed, lives alone. She is widowed for 6 years now. She has 5 children who live close by. She never smoked or drank alcohol. 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   REVIEW OF SYSTEMS:  GENERAL:  Positive for fatigue and weakness earlier today, pains in the back as well as her chest, feeling presyncopal, nauseated and vomiting x1 at home in the middle of the day. She also noticed increased frequency of urination earlier today, but especially here in the afternoon. She also admits of having incontinence, which seems to be chronic. Admits of having some pressure and discomfort in the suprapubic area. She moves with a walker. Admits of having some left thigh numbness since at least a few weeks ago. Denies any fevers, chills, weight loss or gain.  EYES: Denies any blurry vision, double vision, glaucoma. Patient did have cataract removal bilaterally.   ENT: Denies any tinnitus, allergies, epistaxis. Denies hearing loss, sinus pain, dentures, difficulty swallowing.   RESPIRATORY:  Denies cough, wheezes, asthma, or COPD.  CARDIOVASCULAR: Denies any orthopnea, edema, arrhythmias or palpitations. Admits to having some discomfort earlier today in the morning. Whenever she had chest pain, she had elevated  heart rate.  GASTROINTESTINAL: Denies any diarrhea, constipation, rectal bleeding, change in bowel habits.  GENITOURINARY: Denies any dysuria or hematuria. Admits of having increased frequency of urination, intermittent incontinence.  ENDOCRINOLOGY: Denies any polydipsia, nocturia, thyroid problems, heat or cold intolerance or thirst.    HEMATOLOGIC: Denies anemia, easy bruising, bleeding, swollen glands.  SKIN: Denies acne, rashes, change in moles.  MUSCULOSKELETAL: Denies arthritis, cramps, swelling.  NEUROLOGIC: Admits of numbness in the left thigh. No weakness, epilepsy, tremors. PSYCHIATRIC:  Denies anxiety or insomnia.   PHYSICAL EXAMINATION: VITAL SIGNS: On arrival to the hospital, temperature was 99.1, pulse was 90, respiration rate was 24, blood pressure (Dictation Anomaly)223/95, saturation 91% on room air.  GENERAL: This is a well-developed, well-nourished Caucasian female in no significant distress, lying on the stretcher.  HEENT: Pupils are equal, reactive to light. Extraocular movements intact. No icterus or conjunctivitis. Has normal hearing. No pharyngeal erythema. Mucosa is dry. Her lips seem to be white, caked, and somewhat dry.  NECK: No masses. Supple, nontender. Thyroid is not enlarged. No adenopathy. No JVD or carotid bruits bilaterally. Full range of motion.  LUNGS: Crackles at bases, few rales at bases, few rhonchi as well as diminished breath sounds at bases. No wheezing, no labored inspirations, no increased effort to breathe. No dullness to percussion. Not in overt respiratory distress.  CARDIOVASCULAR: S1, S2 appreciated. No murmurs, rubs or gallops noted. Rhythm was regular. PMI not lateralized. Chest is nontender to palpation. There are 1+ pedal pulses. No lower extremity edema, calf tenderness or cyanosis were noted.  ABDOMEN: Soft, nontender. Bowel sounds are present. No hepatosplenomegaly or masses were noted.  RECTAL: Deferred.  MUSCLE STRENGTH: Able to move all  extremities. No cyanosis, degenerative joint disease or kyphosis. Gait not tested.  SKIN: Did not reveal any rashes, lesions, erythema, nodularity or induration. It was warm and dry to palpation.  LYMPHATICS: No adenopathy in the cervical region.  NEUROLOGIC: Cranial nerves grossly intact. Sensory diminished to light touch in left lateral thigh. No dysarthria or aphasia.  PSYCHIATRIC: The patient is alert, oriented to time, person and place, cooperative. Memory is good. No significant confusion, agitation or depression noted.   DIAGNOSTIC DATA:  EKG done in the Emergency Room showed normal sinus rhythm at a rate of 78 beats per minute, first degree AV block was noted. T depressions in septal leads. No acute ST-T changes were noted; however, no EKG to compare with.   The patient's lab data showed the sodium 135, glucose 110, BUN of 22, otherwise BMP was unremarkable. The patient's liver enzymes were normal. Cardiac enzymes, first set negative. CBC within normal limits. The patient's urinalysis: Clear straw urine. Negative for glucose, bilirubin or  ketones. Specific gravity 1.005, pH was 7.0, negative for blood, protein, nitrites, 2+ leukocyte esterase, 3 red blood cells, 18 white blood cells, 3+ bacteria, 2 epithelial cells were noted.   Radiologic studies:  Chest x-ray PA and lateral, 03/14/2013, showed no evidence of pneumonia, cannot exclude A low-grade compensated CHF in appropriate clinical setting.   ASSESSMENT AND PLAN: 1.  Chest pain, concerning for unstable angina. Admit patient to medical floor. Start her on nitroglycerin topically, aspirin, metoprolol, heparin subcutaneously. Follow her cardiac enzymes x3. Will continue Lovenox subcutaneously. Will not order a Myoview, but will ask cardiologist to see  patient in the morning. 2.  Malignant hypertension. Resume all blood pressure medications, except atenolol. Start the patient on metoprolol as well as nitroglycerin topically. Follow patient's  blood pressure readings.  3.  Congestive heart failure, with mild pulmonary edema. Will get echocardiogram done. Will not initiate any IV fluids at this time. We are not going to give the patient Lasix due to patient  not eating and overall looking somewhat dry, with dry oral mucosa. We may need to initiate her on Lasix if her oxygenation is not satisfactory. 4.  Back pain. We will continue Tylenol as well as (Dictation Anomaly)lidoderm  as needed.  5.  Hyponatremia of unclear etiology at this time. CHF on chest x-ray. The patient does look dry on exam; however, we will not initiate IV fluids due to her chest x-ray looking non-satisfactory and patient being somewhat hypoxic.  6.  Hyperlipidemia. Will continue patient on low fat diet. We will get lipid panel in the morning.  7.  Urinary tract infection. Monurol is noted in the Emergency Room. This is therapy which is given only once, so no other antibiotics are required. We will watch, if symptomatology does not resolve, may need to culture patient.   TIME SPENT:  50 minutes.    ____________________________ Theodoro Grist, MD rv:mr D: 03/14/2013 19:51:00 ET T: 03/14/2013 20:26:47 ET JOB#: [id number]  cc: Anderson Malta A. Gilford Rile, MD Theodoro Grist, MD, <Dictator>    Haydyn Liddell MD ELECTRONICALLY SIGNED 04/10/2013 19:58

## 2014-09-12 NOTE — Discharge Summary (Signed)
PATIENT NAME:  Cindy Graham, Cindy Graham MR#:  [id number] DATE OF BIRTH:  01/13/1922  DATE OF ADMISSION:  03/14/2013 DATE OF DISCHARGE:  03/15/2013   DISCHARGE DIAGNOSES: 1.  Chest pain secondary to gastroesophageal reflux disease.  2.  Urinary tract infection.  3.  Hypertension.  4.  Hyperlipidemia.  5.  History of asthma, now needing oxygen.   Discharge home with home physical therapy.   CONSULTATIONS: Dr. Fletcher Anon from cardiology.   DISCHARGE MEDICATIONS: 1.  Multivitamin 1 tablet daily. 2.  Biotin 1000 mcg p.o. daily.  3.  Flaxseed oil 1 capsule daily.  4.  HCTZ with triamterene 50/75 mg p.o. daily.  5.  Omeprazole 20 mg p.o. daily.  6.  Oxybutynin 5 mg daily.  7.  Enalapril  20 mg p.o. b.i.d.  8.  Aspirin 81 mg daily.  9.  Atenolol 25 mg p.o. daily.  10.  Premarin 0.3 mg p.o. daily.  11.  Flovent 110 mcg 2 puffs once a day.  12.  Neurontin 300 mg p.o. daily.  13.  Nitrostat sublingually p.r.n. for chest pain.  14.  Lidoderm patch 5% daily.  15.  Zoloft 50 mg 2 tablets daily.  16.  Verapamil CD 180 mg extended-release 1 tablet daily.  17.  Cipro 500 mg every 12 hours for 10 days. 18.  Simvastatin 20 mg daily.  FOLLOWUP: With Dr. Ronette Deter.   HOSPITAL COURSE: This is an 81 year old female patient admitted yesterday because of mild midsternal chest pain. The patient was seen on October 21 in the ER for back pain, was given tramadol. The patient took the tramadol. Immediately she felt nauseous and had an episode of vomiting in the ER and had chest pain. The patient's blood pressure was elevated in the Emergency Room to systolic more than 267, admitted because of chest pain and also malignant hypertension. The patient EKG's showed normal sinus rhythm with no ST-T changes. The patient's troponins have been negative. She was started on aspirin, beta blockers. Seen by cardiology, Dr. Fletcher Anon. He recommended stress test for her, but the patient does not want stress test and also did not agree  for any cardiac work-up except echocardiogram. Troponins have been negative x 3. LDL is 150. The patient's electrolytes are within normal limits. White count was normal. Echocardiogram showed EF of 55% to 60% with normal LV function. The patient's urinalysis showed 2+ leukocyte esterase and 18 WBC and 3+ bacteria. Urine culture showed more than 100,000 colonies of gram-negative rods. The patient did receive fosfomycin in the Emergency Room. SHE IS ALLERGIC TO PENICILLINS, SEPTRA, TETRACYCLINES AND NEOSPORIN, so I called in prescription for Cipro. The patient wanted home health, so physical therapy worked with her. O2 sats were 85% on room air and on exertion, so I will discharge her with 2 liters of oxygen.  TIME SPENT ON DISCHARGE PREPARATION: More than 30 minutes.  ____________________________ Epifanio Lesches, MD sk:jm D: 03/15/2013 14:00:19 ET T: 03/15/2013 16:05:42 ET JOB#: [id number]  cc: Epifanio Lesches, MD, <Dictator> Muhammad A. Fletcher Anon, MD Eduard Clos Gilford Rile, MD Epifanio Lesches MD ELECTRONICALLY SIGNED 03/30/2013 23:36

## 2014-09-17 DIAGNOSIS — I509 Heart failure, unspecified: Secondary | ICD-10-CM | POA: Diagnosis not present

## 2014-09-17 DIAGNOSIS — N390 Urinary tract infection, site not specified: Secondary | ICD-10-CM | POA: Diagnosis not present

## 2014-09-17 DIAGNOSIS — R001 Bradycardia, unspecified: Secondary | ICD-10-CM | POA: Diagnosis not present

## 2014-09-17 DIAGNOSIS — M19019 Primary osteoarthritis, unspecified shoulder: Secondary | ICD-10-CM | POA: Diagnosis not present

## 2014-09-17 DIAGNOSIS — I959 Hypotension, unspecified: Secondary | ICD-10-CM | POA: Diagnosis not present

## 2014-09-17 DIAGNOSIS — R32 Unspecified urinary incontinence: Secondary | ICD-10-CM | POA: Diagnosis not present

## 2014-09-22 DIAGNOSIS — I509 Heart failure, unspecified: Secondary | ICD-10-CM | POA: Diagnosis not present

## 2014-09-22 DIAGNOSIS — R32 Unspecified urinary incontinence: Secondary | ICD-10-CM | POA: Diagnosis not present

## 2014-09-22 DIAGNOSIS — N390 Urinary tract infection, site not specified: Secondary | ICD-10-CM | POA: Diagnosis not present

## 2014-09-22 DIAGNOSIS — I959 Hypotension, unspecified: Secondary | ICD-10-CM | POA: Diagnosis not present

## 2014-09-22 DIAGNOSIS — R001 Bradycardia, unspecified: Secondary | ICD-10-CM | POA: Diagnosis not present

## 2014-09-22 DIAGNOSIS — M19019 Primary osteoarthritis, unspecified shoulder: Secondary | ICD-10-CM | POA: Diagnosis not present

## 2014-09-23 ENCOUNTER — Encounter: Payer: Self-pay | Admitting: Internal Medicine

## 2014-09-23 DIAGNOSIS — J189 Pneumonia, unspecified organism: Secondary | ICD-10-CM | POA: Diagnosis not present

## 2014-09-24 ENCOUNTER — Telehealth: Payer: Self-pay | Admitting: *Deleted

## 2014-09-24 DIAGNOSIS — M5126 Other intervertebral disc displacement, lumbar region: Secondary | ICD-10-CM | POA: Diagnosis not present

## 2014-09-24 DIAGNOSIS — M5416 Radiculopathy, lumbar region: Secondary | ICD-10-CM | POA: Diagnosis not present

## 2014-09-24 NOTE — Telephone Encounter (Signed)
Left vm notifying home health nurse

## 2014-09-24 NOTE — Telephone Encounter (Signed)
Normal results, no pneumonia noted, you can let the home health nurse know, I will sign and send to scan. Thanks!

## 2014-09-24 NOTE — Telephone Encounter (Signed)
Pt's repeat x-ray results in folder for review in dr walkers absents , Please advise results to notify pt and pt's home health nurse.

## 2014-09-25 ENCOUNTER — Telehealth: Payer: Self-pay

## 2014-09-25 NOTE — Telephone Encounter (Signed)
Please see below request  

## 2014-09-25 NOTE — Telephone Encounter (Signed)
The home health rn called and is hoping to get a verbal order to continue home health nursing  RN - [name] - with [facility] [phone]

## 2014-09-25 NOTE — Telephone Encounter (Signed)
Fine to continue home health

## 2014-09-26 NOTE — Telephone Encounter (Signed)
Notified home health nurse

## 2014-09-30 DIAGNOSIS — N390 Urinary tract infection, site not specified: Secondary | ICD-10-CM | POA: Diagnosis not present

## 2014-09-30 DIAGNOSIS — M19019 Primary osteoarthritis, unspecified shoulder: Secondary | ICD-10-CM | POA: Diagnosis not present

## 2014-09-30 DIAGNOSIS — I509 Heart failure, unspecified: Secondary | ICD-10-CM | POA: Diagnosis not present

## 2014-09-30 DIAGNOSIS — R001 Bradycardia, unspecified: Secondary | ICD-10-CM | POA: Diagnosis not present

## 2014-09-30 DIAGNOSIS — I959 Hypotension, unspecified: Secondary | ICD-10-CM | POA: Diagnosis not present

## 2014-09-30 DIAGNOSIS — R32 Unspecified urinary incontinence: Secondary | ICD-10-CM | POA: Diagnosis not present

## 2014-10-02 DIAGNOSIS — J189 Pneumonia, unspecified organism: Secondary | ICD-10-CM | POA: Diagnosis not present

## 2014-10-02 DIAGNOSIS — R32 Unspecified urinary incontinence: Secondary | ICD-10-CM | POA: Diagnosis not present

## 2014-10-02 DIAGNOSIS — Z9981 Dependence on supplemental oxygen: Secondary | ICD-10-CM | POA: Diagnosis not present

## 2014-10-02 DIAGNOSIS — Z87440 Personal history of urinary (tract) infections: Secondary | ICD-10-CM | POA: Diagnosis not present

## 2014-10-02 DIAGNOSIS — R001 Bradycardia, unspecified: Secondary | ICD-10-CM | POA: Diagnosis not present

## 2014-10-02 DIAGNOSIS — I509 Heart failure, unspecified: Secondary | ICD-10-CM | POA: Diagnosis not present

## 2014-10-02 DIAGNOSIS — I959 Hypotension, unspecified: Secondary | ICD-10-CM | POA: Diagnosis not present

## 2014-10-02 DIAGNOSIS — M19019 Primary osteoarthritis, unspecified shoulder: Secondary | ICD-10-CM | POA: Diagnosis not present

## 2014-10-03 ENCOUNTER — Encounter: Payer: Self-pay | Admitting: Internal Medicine

## 2014-10-07 ENCOUNTER — Ambulatory Visit: Payer: Medicare Other | Admitting: Internal Medicine

## 2014-10-08 ENCOUNTER — Encounter: Payer: Self-pay | Admitting: Internal Medicine

## 2014-10-08 DIAGNOSIS — R32 Unspecified urinary incontinence: Secondary | ICD-10-CM | POA: Diagnosis not present

## 2014-10-08 DIAGNOSIS — I959 Hypotension, unspecified: Secondary | ICD-10-CM | POA: Diagnosis not present

## 2014-10-08 DIAGNOSIS — R001 Bradycardia, unspecified: Secondary | ICD-10-CM | POA: Diagnosis not present

## 2014-10-08 DIAGNOSIS — M19019 Primary osteoarthritis, unspecified shoulder: Secondary | ICD-10-CM | POA: Diagnosis not present

## 2014-10-08 DIAGNOSIS — J189 Pneumonia, unspecified organism: Secondary | ICD-10-CM | POA: Diagnosis not present

## 2014-10-08 DIAGNOSIS — I509 Heart failure, unspecified: Secondary | ICD-10-CM | POA: Diagnosis not present

## 2014-10-13 DIAGNOSIS — R32 Unspecified urinary incontinence: Secondary | ICD-10-CM | POA: Diagnosis not present

## 2014-10-13 DIAGNOSIS — M19019 Primary osteoarthritis, unspecified shoulder: Secondary | ICD-10-CM | POA: Diagnosis not present

## 2014-10-13 DIAGNOSIS — I509 Heart failure, unspecified: Secondary | ICD-10-CM | POA: Diagnosis not present

## 2014-10-13 DIAGNOSIS — R001 Bradycardia, unspecified: Secondary | ICD-10-CM | POA: Diagnosis not present

## 2014-10-13 DIAGNOSIS — I959 Hypotension, unspecified: Secondary | ICD-10-CM | POA: Diagnosis not present

## 2014-10-13 DIAGNOSIS — J189 Pneumonia, unspecified organism: Secondary | ICD-10-CM | POA: Diagnosis not present

## 2014-10-16 DIAGNOSIS — S81802A Unspecified open wound, left lower leg, initial encounter: Secondary | ICD-10-CM | POA: Diagnosis not present

## 2014-10-18 DIAGNOSIS — S81802A Unspecified open wound, left lower leg, initial encounter: Secondary | ICD-10-CM | POA: Diagnosis not present

## 2014-10-20 DIAGNOSIS — I509 Heart failure, unspecified: Secondary | ICD-10-CM | POA: Diagnosis not present

## 2014-10-20 DIAGNOSIS — R32 Unspecified urinary incontinence: Secondary | ICD-10-CM | POA: Diagnosis not present

## 2014-10-20 DIAGNOSIS — I959 Hypotension, unspecified: Secondary | ICD-10-CM | POA: Diagnosis not present

## 2014-10-20 DIAGNOSIS — R001 Bradycardia, unspecified: Secondary | ICD-10-CM | POA: Diagnosis not present

## 2014-10-20 DIAGNOSIS — J189 Pneumonia, unspecified organism: Secondary | ICD-10-CM | POA: Diagnosis not present

## 2014-10-20 DIAGNOSIS — M19019 Primary osteoarthritis, unspecified shoulder: Secondary | ICD-10-CM | POA: Diagnosis not present

## 2014-10-21 DIAGNOSIS — I509 Heart failure, unspecified: Secondary | ICD-10-CM | POA: Diagnosis not present

## 2014-10-21 DIAGNOSIS — M19019 Primary osteoarthritis, unspecified shoulder: Secondary | ICD-10-CM | POA: Diagnosis not present

## 2014-10-21 DIAGNOSIS — R32 Unspecified urinary incontinence: Secondary | ICD-10-CM | POA: Diagnosis not present

## 2014-10-21 DIAGNOSIS — J189 Pneumonia, unspecified organism: Secondary | ICD-10-CM | POA: Diagnosis not present

## 2014-10-21 DIAGNOSIS — I959 Hypotension, unspecified: Secondary | ICD-10-CM | POA: Diagnosis not present

## 2014-10-21 DIAGNOSIS — R001 Bradycardia, unspecified: Secondary | ICD-10-CM | POA: Diagnosis not present

## 2014-10-22 DIAGNOSIS — M19019 Primary osteoarthritis, unspecified shoulder: Secondary | ICD-10-CM | POA: Diagnosis not present

## 2014-10-22 DIAGNOSIS — I509 Heart failure, unspecified: Secondary | ICD-10-CM | POA: Diagnosis not present

## 2014-10-22 DIAGNOSIS — J189 Pneumonia, unspecified organism: Secondary | ICD-10-CM | POA: Diagnosis not present

## 2014-10-22 DIAGNOSIS — R32 Unspecified urinary incontinence: Secondary | ICD-10-CM | POA: Diagnosis not present

## 2014-10-22 DIAGNOSIS — R001 Bradycardia, unspecified: Secondary | ICD-10-CM | POA: Diagnosis not present

## 2014-10-22 DIAGNOSIS — I959 Hypotension, unspecified: Secondary | ICD-10-CM | POA: Diagnosis not present

## 2014-10-28 ENCOUNTER — Telehealth: Payer: Self-pay

## 2014-10-28 DIAGNOSIS — R001 Bradycardia, unspecified: Secondary | ICD-10-CM | POA: Diagnosis not present

## 2014-10-28 DIAGNOSIS — I509 Heart failure, unspecified: Secondary | ICD-10-CM | POA: Diagnosis not present

## 2014-10-28 DIAGNOSIS — R32 Unspecified urinary incontinence: Secondary | ICD-10-CM | POA: Diagnosis not present

## 2014-10-28 DIAGNOSIS — J189 Pneumonia, unspecified organism: Secondary | ICD-10-CM | POA: Diagnosis not present

## 2014-10-28 DIAGNOSIS — M19019 Primary osteoarthritis, unspecified shoulder: Secondary | ICD-10-CM | POA: Diagnosis not present

## 2014-10-28 DIAGNOSIS — I959 Hypotension, unspecified: Secondary | ICD-10-CM | POA: Diagnosis not present

## 2014-10-28 NOTE — Telephone Encounter (Signed)
The patient's home health rn called and is hoping to move the patient's home health apt up due to needing a skin tag to be examined. The home health rn believes the pt needs oral antibiotics.

## 2014-10-28 NOTE — Telephone Encounter (Signed)
Needs to be seen 53min visit

## 2014-10-29 NOTE — Telephone Encounter (Signed)
Please provide needed information: Home health nurse name and phone number

## 2014-10-31 ENCOUNTER — Telehealth: Payer: Self-pay | Admitting: Internal Medicine

## 2014-10-31 NOTE — Telephone Encounter (Signed)
Fine to get UA and culture.

## 2014-10-31 NOTE — Telephone Encounter (Signed)
Ok for UA and culture

## 2014-10-31 NOTE — Telephone Encounter (Signed)
Linda notified. 

## 2014-10-31 NOTE — Telephone Encounter (Signed)
Pt home nurse called and said the pt has a fowl smell in urine. Please advise home nurse [name] at [phone].

## 2014-11-01 DIAGNOSIS — M19019 Primary osteoarthritis, unspecified shoulder: Secondary | ICD-10-CM | POA: Diagnosis not present

## 2014-11-01 DIAGNOSIS — R32 Unspecified urinary incontinence: Secondary | ICD-10-CM | POA: Diagnosis not present

## 2014-11-01 DIAGNOSIS — I959 Hypotension, unspecified: Secondary | ICD-10-CM | POA: Diagnosis not present

## 2014-11-01 DIAGNOSIS — I509 Heart failure, unspecified: Secondary | ICD-10-CM | POA: Diagnosis not present

## 2014-11-01 DIAGNOSIS — R001 Bradycardia, unspecified: Secondary | ICD-10-CM | POA: Diagnosis not present

## 2014-11-01 DIAGNOSIS — J189 Pneumonia, unspecified organism: Secondary | ICD-10-CM | POA: Diagnosis not present

## 2014-11-01 DIAGNOSIS — N390 Urinary tract infection, site not specified: Secondary | ICD-10-CM | POA: Diagnosis not present

## 2014-11-03 DIAGNOSIS — I509 Heart failure, unspecified: Secondary | ICD-10-CM | POA: Diagnosis not present

## 2014-11-03 DIAGNOSIS — I959 Hypotension, unspecified: Secondary | ICD-10-CM | POA: Diagnosis not present

## 2014-11-03 DIAGNOSIS — R32 Unspecified urinary incontinence: Secondary | ICD-10-CM | POA: Diagnosis not present

## 2014-11-03 DIAGNOSIS — R001 Bradycardia, unspecified: Secondary | ICD-10-CM | POA: Diagnosis not present

## 2014-11-03 DIAGNOSIS — M19019 Primary osteoarthritis, unspecified shoulder: Secondary | ICD-10-CM | POA: Diagnosis not present

## 2014-11-03 DIAGNOSIS — J189 Pneumonia, unspecified organism: Secondary | ICD-10-CM | POA: Diagnosis not present

## 2014-11-04 ENCOUNTER — Telehealth: Payer: Self-pay

## 2014-11-04 NOTE — Telephone Encounter (Signed)
The patient is hoping to find out the results of her UA and if she needs an antibiotic  Callback - [phone]

## 2014-11-04 NOTE — Telephone Encounter (Signed)
Notified pt that we have not received results

## 2014-11-05 ENCOUNTER — Telehealth: Payer: Self-pay

## 2014-11-05 ENCOUNTER — Other Ambulatory Visit: Payer: Self-pay | Admitting: *Deleted

## 2014-11-05 MED ORDER — CIPROFLOXACIN HCL 250 MG PO TABS: 250.0000 mg | ORAL_TABLET | Freq: Two times a day (BID) | ORAL | Status: DC

## 2014-11-05 NOTE — Telephone Encounter (Signed)
Spoke with Cindy Graham, she states pt reported to her that she had a fever last night.  Cindy Graham further explained that pts confusion is that she is forgetting how to do things.  States pt called her 3 times today before leaving a message and usually she would know to leave a message on the first call.  States pt refuses to go to the ER.  UA results received today ready for your review.

## 2014-11-05 NOTE — Telephone Encounter (Signed)
I spoke with pt, she states [employer] her Home health RN is acquiring the UA results from [facility] further states she is unable to come in to office until 6.16.16.

## 2014-11-05 NOTE — Telephone Encounter (Signed)
Urine showed infection. I would recommend starting Cipro 250mg  po bid x7 days. However, PT NEEDS TO BE SEEN. If not today, then needs visit tomorrow. Confusion would be unusual for her.

## 2014-11-05 NOTE — Telephone Encounter (Signed)
OK. I have not yet seen UA results

## 2014-11-05 NOTE — Telephone Encounter (Signed)
Patient Name: Cindy Graham DOB: [birth year] Initial Comment Caller states she was diagnosed with urinary infection, has fever and doesn't feel well Nurse Assessment Nurse: Vallery Sa, RN, Cathy Date/Time (Eastern Time): 11/05/2014 10:06:38 AM Confirm and document reason for call. If symptomatic, describe symptoms. ---Caller states she developed pain with urination and fever (estimates that her fever is moderate by touch) 4 days ago and specimen was taken to [facility]. She is expecting a call from the MD regarding the results. No blood in urine. Has the patient traveled out of the country within the last 30 days? ---No Does the patient require triage? ---Yes Related visit to physician within the last 2 weeks? ---No Does the PT have any chronic conditions? (i.e. diabetes, asthma, etc.) ---Yes List chronic conditions. ---High Blood Pressure, frequent UTI's, Guidelines Guideline Title Affirmed Question Affirmed Notes Urination Pain - Female Side (flank) or lower back pain present Final Disposition User See Physician within 4 Hours (or PCP triage) Vallery Sa, RN, Cindy Graham shares that she has an appointment scheduled for 3:30pm tomorrow and is unable to see the MD today due to transportation problems. She asks to get a message to MD to see if her lab results from [facility] indicate she should start antibiotics today. called the office backline and Nitcheia will assist her further.

## 2014-11-05 NOTE — Telephone Encounter (Signed)
If this pt has fever and confusion, needs to be seen in the ED ASAP.

## 2014-11-05 NOTE — Telephone Encounter (Signed)
Linda Click with [facility] called back and wanted to notify the office that the patient has a fever and confusion.  She states she has re-faxed the UA notes.  Callback - [phone]

## 2014-11-06 ENCOUNTER — Encounter: Payer: Self-pay | Admitting: Internal Medicine

## 2014-11-06 ENCOUNTER — Ambulatory Visit (INDEPENDENT_AMBULATORY_CARE_PROVIDER_SITE_OTHER): Payer: Medicare Other | Admitting: Internal Medicine

## 2014-11-06 VITALS — BP 114/62 | HR 63 | Temp 98.0°F | Resp 14 | Ht 63.5 in | Wt 189.2 lb

## 2014-11-06 DIAGNOSIS — S81812S Laceration without foreign body, left lower leg, sequela: Secondary | ICD-10-CM

## 2014-11-06 DIAGNOSIS — N183 Chronic kidney disease, stage 3 unspecified: Secondary | ICD-10-CM

## 2014-11-06 DIAGNOSIS — N3000 Acute cystitis without hematuria: Secondary | ICD-10-CM

## 2014-11-06 DIAGNOSIS — I10 Essential (primary) hypertension: Secondary | ICD-10-CM

## 2014-11-06 NOTE — Progress Notes (Signed)
Pre visit review using our clinic review tool, if applicable. No additional management support is needed unless otherwise documented below in the visit note. 

## 2014-11-06 NOTE — Assessment & Plan Note (Signed)
Recheck renal function with labs today.

## 2014-11-06 NOTE — Progress Notes (Signed)
Subjective:    Patient ID: Cindy Graham, female    DOB: 17-Mar-1922, 81 y.o.   MRN: [id number]  HPI  81YO female presents for acute visit.  Recently started on Cipro for E. Coli UTI. Had dysuria and fever on Monday. Started Cipro yesterday. Feeling better.  HTN - BP running 110s/60s at home. Compliant with medications.  Left leg wound - 3 weeks ago, hit leg on chair. Seen at [facility]. Started topical antibiotic and has been applying dry dressing.  Past medical, surgical, family and social history per today's encounter.  Review of Systems  Constitutional: Positive for fever. Negative for chills, appetite change, fatigue and unexpected weight change.  Eyes: Negative for visual disturbance.  Respiratory: Negative for shortness of breath.   Cardiovascular: Negative for chest pain and leg swelling.  Gastrointestinal: Negative for nausea, vomiting, abdominal pain, diarrhea and constipation.  Genitourinary: Positive for dysuria. Negative for urgency, frequency, decreased urine volume and pelvic pain.  Musculoskeletal: Positive for myalgias, back pain and arthralgias.  Skin: Positive for color change and wound. Negative for rash.  Hematological: Negative for adenopathy. Does not bruise/bleed easily.  Psychiatric/Behavioral: Negative for dysphoric mood. The patient is not nervous/anxious.        Objective:    BP 114/62 mmHg  Pulse 63  Temp(Src) 98 F (36.7 C) (Oral)  Resp 14  Ht 5' 3.5" (1.613 m)  Wt 189 lb 3.2 oz (85.821 kg)  BMI 32.99 kg/m2  SpO2 95% Physical Exam  Constitutional: She is oriented to person, place, and time. She appears well-developed and well-nourished. No distress.  HENT:  Head: Normocephalic and atraumatic.  Right Ear: External ear normal.  Left Ear: External ear normal.  Nose: Nose normal.  Mouth/Throat: Oropharynx is clear and moist. No oropharyngeal exudate.  Eyes: Conjunctivae are normal. Pupils are equal, round, and reactive to light.  Right eye exhibits no discharge. Left eye exhibits no discharge. No scleral icterus.  Neck: Normal range of motion. Neck supple. No tracheal deviation present. No thyromegaly present.  Cardiovascular: Normal rate, regular rhythm, normal heart sounds and intact distal pulses.  Exam reveals no gallop and no friction rub.   No murmur heard. Pulmonary/Chest: Effort normal and breath sounds normal. No respiratory distress. She has no wheezes. She has no rales. She exhibits no tenderness.  Abdominal: There is no tenderness (no CVA tenderness).  Musculoskeletal: Normal range of motion. She exhibits no edema or tenderness.  Lymphadenopathy:    She has no cervical adenopathy.  Neurological: She is alert and oriented to person, place, and time. No cranial nerve deficit. She exhibits normal muscle tone. Coordination normal.  Skin: Skin is warm and dry. No rash noted. She is not diaphoretic. No erythema. No pallor.     Psychiatric: She has a normal mood and affect. Her behavior is normal. Judgment and thought content normal.          Assessment & Plan:   Problem List Items Addressed This Visit      Unprioritized   Chronic kidney disease (CKD), stage III (moderate)    Recheck renal function with labs today.      Relevant Orders   Comprehensive metabolic panel   Hypertension    BP Readings from Last 3 Encounters:  11/06/14 114/62  08/29/14 105/64  07/16/14 135/70   BP well controlled. Renal function with labs. Continue current medications.      Infection of urinary tract - Primary    Urine culture showed E. Coli UTI  sens to Cipro. Will continue Cipro for 7 days. Follow up prn.      Skin tear of lower leg without complication    Skin tear appears to be healing well. Applied new bandage with [place]. Follow up recheck in 4 weeks. Continue monitoring with home health.          Return in about 4 weeks (around 12/04/2014) for Recheck.

## 2014-11-06 NOTE — Assessment & Plan Note (Signed)
Urine culture showed E. Coli UTI sens to Cipro. Will continue Cipro for 7 days. Follow up prn.

## 2014-11-06 NOTE — Assessment & Plan Note (Signed)
Skin tear appears to be healing well. Applied new bandage with [place]. Follow up recheck in 4 weeks. Continue monitoring with home health.

## 2014-11-06 NOTE — Assessment & Plan Note (Signed)
BP Readings from Last 3 Encounters:  11/06/14 114/62  08/29/14 105/64  07/16/14 135/70   BP well controlled. Renal function with labs. Continue current medications.

## 2014-11-06 NOTE — Patient Instructions (Signed)
Continue Cipro.  Labs today.  Follow up in 4 weeks.

## 2014-11-07 LAB — COMPREHENSIVE METABOLIC PANEL
ALK PHOS: 73 U/L (ref 39–117)
ALT: 13 U/L (ref 0–35)
AST: 19 U/L (ref 0–37)
Albumin: 3.6 g/dL (ref 3.5–5.2)
BILIRUBIN TOTAL: 0.3 mg/dL (ref 0.2–1.2)
BUN: 23 mg/dL (ref 6–23)
CO2: 29 mEq/L (ref 19–32)
Calcium: 9.6 mg/dL (ref 8.4–10.5)
Chloride: 99 mEq/L (ref 96–112)
Creatinine, Ser: 1.25 mg/dL — ABNORMAL HIGH (ref 0.40–1.20)
GFR: 42.67 mL/min — ABNORMAL LOW (ref >60.00)
GLUCOSE: 96 mg/dL (ref 70–99)
POTASSIUM: 4.3 meq/L (ref 3.5–5.1)
SODIUM: 134 meq/L — AB (ref 135–145)
TOTAL PROTEIN: 6.6 g/dL (ref 6.0–8.3)

## 2014-11-12 DIAGNOSIS — M19019 Primary osteoarthritis, unspecified shoulder: Secondary | ICD-10-CM | POA: Diagnosis not present

## 2014-11-12 DIAGNOSIS — J189 Pneumonia, unspecified organism: Secondary | ICD-10-CM | POA: Diagnosis not present

## 2014-11-12 DIAGNOSIS — I509 Heart failure, unspecified: Secondary | ICD-10-CM | POA: Diagnosis not present

## 2014-11-12 DIAGNOSIS — R001 Bradycardia, unspecified: Secondary | ICD-10-CM | POA: Diagnosis not present

## 2014-11-12 DIAGNOSIS — I959 Hypotension, unspecified: Secondary | ICD-10-CM | POA: Diagnosis not present

## 2014-11-12 DIAGNOSIS — R32 Unspecified urinary incontinence: Secondary | ICD-10-CM | POA: Diagnosis not present

## 2014-11-17 ENCOUNTER — Other Ambulatory Visit: Payer: Self-pay | Admitting: Internal Medicine

## 2014-11-18 ENCOUNTER — Telehealth: Payer: Self-pay | Admitting: *Deleted

## 2014-11-18 DIAGNOSIS — I509 Heart failure, unspecified: Secondary | ICD-10-CM | POA: Diagnosis not present

## 2014-11-18 DIAGNOSIS — R001 Bradycardia, unspecified: Secondary | ICD-10-CM | POA: Diagnosis not present

## 2014-11-18 DIAGNOSIS — J9601 Acute respiratory failure with hypoxia: Secondary | ICD-10-CM

## 2014-11-18 DIAGNOSIS — M19019 Primary osteoarthritis, unspecified shoulder: Secondary | ICD-10-CM | POA: Diagnosis not present

## 2014-11-18 DIAGNOSIS — R32 Unspecified urinary incontinence: Secondary | ICD-10-CM | POA: Diagnosis not present

## 2014-11-18 DIAGNOSIS — I959 Hypotension, unspecified: Secondary | ICD-10-CM | POA: Diagnosis not present

## 2014-11-18 DIAGNOSIS — J189 Pneumonia, unspecified organism: Secondary | ICD-10-CM | POA: Diagnosis not present

## 2014-11-18 NOTE — Telephone Encounter (Signed)
[name] called states during todays visit the pts BP was elevated at 160/70, O2 sats were 91-92%, she had increased edema.  [name] is requesting a mobile chest Xray.  Please advise in Dr Derry Skill absence

## 2014-11-18 NOTE — Telephone Encounter (Signed)
Linda given verbal order.

## 2014-11-18 NOTE — Telephone Encounter (Signed)
Order for portable  Chest x ray in [facility]

## 2014-11-18 NOTE — Telephone Encounter (Signed)
[place] care personnel [phone] states pt has a calloused 3rd toe that is bleeding and she needs to be seen.

## 2014-11-18 NOTE — Telephone Encounter (Signed)
Tammy-please call caregiver and schedule appt for pt.

## 2014-11-19 DIAGNOSIS — R0602 Shortness of breath: Secondary | ICD-10-CM | POA: Diagnosis not present

## 2014-11-19 NOTE — Telephone Encounter (Signed)
Spoke with patient again today and informed her she needed to be seen and it would need to be in our [place] office. Patient will call a relative and see if she can get a ride. Patient will call us back to schedule appointment. I did advise that she did need to have the toe checked by someone soon, whether by our office, PCP or ER.

## 2014-11-20 ENCOUNTER — Telehealth: Payer: Self-pay | Admitting: Internal Medicine

## 2014-11-20 DIAGNOSIS — L03031 Cellulitis of right toe: Secondary | ICD-10-CM | POA: Diagnosis not present

## 2014-11-20 NOTE — Telephone Encounter (Signed)
Notified pt. 

## 2014-11-20 NOTE — Telephone Encounter (Signed)
Chest xray is normal

## 2014-11-26 DIAGNOSIS — I959 Hypotension, unspecified: Secondary | ICD-10-CM | POA: Diagnosis not present

## 2014-11-26 DIAGNOSIS — R32 Unspecified urinary incontinence: Secondary | ICD-10-CM | POA: Diagnosis not present

## 2014-11-26 DIAGNOSIS — J189 Pneumonia, unspecified organism: Secondary | ICD-10-CM | POA: Diagnosis not present

## 2014-11-26 DIAGNOSIS — M19019 Primary osteoarthritis, unspecified shoulder: Secondary | ICD-10-CM | POA: Diagnosis not present

## 2014-11-26 DIAGNOSIS — R001 Bradycardia, unspecified: Secondary | ICD-10-CM | POA: Diagnosis not present

## 2014-11-26 DIAGNOSIS — I509 Heart failure, unspecified: Secondary | ICD-10-CM | POA: Diagnosis not present

## 2014-11-28 ENCOUNTER — Encounter: Payer: Self-pay | Admitting: Internal Medicine

## 2014-12-01 DIAGNOSIS — M19019 Primary osteoarthritis, unspecified shoulder: Secondary | ICD-10-CM | POA: Diagnosis not present

## 2014-12-01 DIAGNOSIS — S81802D Unspecified open wound, left lower leg, subsequent encounter: Secondary | ICD-10-CM | POA: Diagnosis not present

## 2014-12-01 DIAGNOSIS — R001 Bradycardia, unspecified: Secondary | ICD-10-CM | POA: Diagnosis not present

## 2014-12-01 DIAGNOSIS — I509 Heart failure, unspecified: Secondary | ICD-10-CM | POA: Diagnosis not present

## 2014-12-01 DIAGNOSIS — Z9981 Dependence on supplemental oxygen: Secondary | ICD-10-CM | POA: Diagnosis not present

## 2014-12-01 DIAGNOSIS — R32 Unspecified urinary incontinence: Secondary | ICD-10-CM | POA: Diagnosis not present

## 2014-12-01 DIAGNOSIS — L989 Disorder of the skin and subcutaneous tissue, unspecified: Secondary | ICD-10-CM | POA: Diagnosis not present

## 2014-12-01 DIAGNOSIS — Z87440 Personal history of urinary (tract) infections: Secondary | ICD-10-CM | POA: Diagnosis not present

## 2014-12-02 DIAGNOSIS — R32 Unspecified urinary incontinence: Secondary | ICD-10-CM | POA: Diagnosis not present

## 2014-12-02 DIAGNOSIS — L989 Disorder of the skin and subcutaneous tissue, unspecified: Secondary | ICD-10-CM | POA: Diagnosis not present

## 2014-12-02 DIAGNOSIS — S81802D Unspecified open wound, left lower leg, subsequent encounter: Secondary | ICD-10-CM | POA: Diagnosis not present

## 2014-12-02 DIAGNOSIS — M19019 Primary osteoarthritis, unspecified shoulder: Secondary | ICD-10-CM | POA: Diagnosis not present

## 2014-12-02 DIAGNOSIS — I509 Heart failure, unspecified: Secondary | ICD-10-CM | POA: Diagnosis not present

## 2014-12-02 DIAGNOSIS — R001 Bradycardia, unspecified: Secondary | ICD-10-CM | POA: Diagnosis not present

## 2014-12-04 ENCOUNTER — Encounter: Payer: Self-pay | Admitting: Internal Medicine

## 2014-12-04 ENCOUNTER — Ambulatory Visit (INDEPENDENT_AMBULATORY_CARE_PROVIDER_SITE_OTHER): Payer: Medicare Other | Admitting: Internal Medicine

## 2014-12-04 ENCOUNTER — Ambulatory Visit: Payer: Medicare Other | Admitting: Internal Medicine

## 2014-12-04 VITALS — BP 133/68 | HR 62 | Temp 98.0°F | Ht 63.5 in | Wt 187.5 lb

## 2014-12-04 DIAGNOSIS — M1990 Unspecified osteoarthritis, unspecified site: Secondary | ICD-10-CM | POA: Diagnosis not present

## 2014-12-04 DIAGNOSIS — S81812S Laceration without foreign body, left lower leg, sequela: Secondary | ICD-10-CM | POA: Diagnosis not present

## 2014-12-04 DIAGNOSIS — I10 Essential (primary) hypertension: Secondary | ICD-10-CM

## 2014-12-04 NOTE — Progress Notes (Signed)
Pre visit review using our clinic review tool, if applicable. No additional management support is needed unless otherwise documented below in the visit note. 

## 2014-12-04 NOTE — Progress Notes (Signed)
   Subjective:    Patient ID: Cindy Graham, female    DOB: Dec 15, 1921, 81 y.o.   MRN: [id number]  HPI  81YO female presents for follow up.  Feeling well. Cough has resolved.  Pain has been well controlled on Nucynta once per day.  No new concerns today.  Left lower leg wound healing. Mild redness noted. Home health nurse coming out 2x per week. Applying honey based dressing.   Past medical, surgical, family and social history per today's encounter.  Review of Systems  Constitutional: Negative for fever, chills, appetite change, fatigue and unexpected weight change.  Eyes: Negative for visual disturbance.  Respiratory: Negative for cough and shortness of breath.   Cardiovascular: Negative for chest pain and leg swelling.  Gastrointestinal: Negative for abdominal pain.  Musculoskeletal: Positive for myalgias, back pain and arthralgias.  Skin: Positive for wound. Negative for color change and rash.  Hematological: Negative for adenopathy. Does not bruise/bleed easily.  Psychiatric/Behavioral: Negative for dysphoric mood. The patient is not nervous/anxious.        Objective:    BP 133/68 mmHg  Pulse 62  Temp(Src) 98 F (36.7 C) (Oral)  Ht 5' 3.5" (1.613 m)  Wt 187 lb 8 oz (85.049 kg)  BMI 32.69 kg/m2  SpO2 96% Physical Exam  Constitutional: She is oriented to person, place, and time. She appears well-developed and well-nourished. No distress.  HENT:  Head: Normocephalic and atraumatic.  Right Ear: External ear normal.  Left Ear: External ear normal.  Nose: Nose normal.  Mouth/Throat: Oropharynx is clear and moist. No oropharyngeal exudate.  Eyes: Conjunctivae are normal. Pupils are equal, round, and reactive to light. Right eye exhibits no discharge. Left eye exhibits no discharge. No scleral icterus.  Neck: Normal range of motion. Neck supple. No tracheal deviation present. No thyromegaly present.  Cardiovascular: Normal rate, regular rhythm, normal heart sounds and  intact distal pulses.  Exam reveals no gallop and no friction rub.   No murmur heard. Pulmonary/Chest: Effort normal and breath sounds normal. No respiratory distress. She has no wheezes. She has no rales. She exhibits no tenderness.  Musculoskeletal: Normal range of motion. She exhibits no edema or tenderness.  Lymphadenopathy:    She has no cervical adenopathy.  Neurological: She is alert and oriented to person, place, and time. No cranial nerve deficit. She exhibits normal muscle tone. Coordination normal.  Skin: Skin is warm and dry. No rash noted. She is not diaphoretic. There is erythema. No pallor.     Psychiatric: She has a normal mood and affect. Her behavior is normal. Judgment and thought content normal.          Assessment & Plan:   Problem List Items Addressed This Visit      Unprioritized   Hypertension - Primary    BP Readings from Last 3 Encounters:  12/04/14 133/68  11/06/14 114/62  08/29/14 105/64   BP well controlled. Recent renal function stable. Continue current medications.      Osteoarthritis    Pain symptoms well controlled with Nucynta. Will continue.      Skin tear of lower leg without complication    Wound continues to improve. Continue HH dressing changes. Follow up prn.          Return in about 3 months (around 03/06/2015) for Recheck.

## 2014-12-04 NOTE — Patient Instructions (Signed)
Continue current medications. 

## 2014-12-05 DIAGNOSIS — M19019 Primary osteoarthritis, unspecified shoulder: Secondary | ICD-10-CM | POA: Diagnosis not present

## 2014-12-05 DIAGNOSIS — I509 Heart failure, unspecified: Secondary | ICD-10-CM | POA: Diagnosis not present

## 2014-12-05 DIAGNOSIS — R32 Unspecified urinary incontinence: Secondary | ICD-10-CM | POA: Diagnosis not present

## 2014-12-05 DIAGNOSIS — R001 Bradycardia, unspecified: Secondary | ICD-10-CM | POA: Diagnosis not present

## 2014-12-05 DIAGNOSIS — L989 Disorder of the skin and subcutaneous tissue, unspecified: Secondary | ICD-10-CM | POA: Diagnosis not present

## 2014-12-05 DIAGNOSIS — S81802D Unspecified open wound, left lower leg, subsequent encounter: Secondary | ICD-10-CM | POA: Diagnosis not present

## 2014-12-05 NOTE — Assessment & Plan Note (Signed)
Wound continues to improve. Continue HH dressing changes. Follow up prn.

## 2014-12-05 NOTE — Assessment & Plan Note (Signed)
Pain symptoms well controlled with Nucynta. Will continue.

## 2014-12-05 NOTE — Assessment & Plan Note (Signed)
BP Readings from Last 3 Encounters:  12/04/14 133/68  11/06/14 114/62  08/29/14 105/64   BP well controlled. Recent renal function stable. Continue current medications.

## 2014-12-09 DIAGNOSIS — L989 Disorder of the skin and subcutaneous tissue, unspecified: Secondary | ICD-10-CM | POA: Diagnosis not present

## 2014-12-09 DIAGNOSIS — R32 Unspecified urinary incontinence: Secondary | ICD-10-CM | POA: Diagnosis not present

## 2014-12-09 DIAGNOSIS — M19019 Primary osteoarthritis, unspecified shoulder: Secondary | ICD-10-CM | POA: Diagnosis not present

## 2014-12-09 DIAGNOSIS — R001 Bradycardia, unspecified: Secondary | ICD-10-CM | POA: Diagnosis not present

## 2014-12-09 DIAGNOSIS — I509 Heart failure, unspecified: Secondary | ICD-10-CM | POA: Diagnosis not present

## 2014-12-09 DIAGNOSIS — S81802D Unspecified open wound, left lower leg, subsequent encounter: Secondary | ICD-10-CM | POA: Diagnosis not present

## 2014-12-12 DIAGNOSIS — R001 Bradycardia, unspecified: Secondary | ICD-10-CM | POA: Diagnosis not present

## 2014-12-12 DIAGNOSIS — S81802D Unspecified open wound, left lower leg, subsequent encounter: Secondary | ICD-10-CM | POA: Diagnosis not present

## 2014-12-12 DIAGNOSIS — L989 Disorder of the skin and subcutaneous tissue, unspecified: Secondary | ICD-10-CM | POA: Diagnosis not present

## 2014-12-12 DIAGNOSIS — R32 Unspecified urinary incontinence: Secondary | ICD-10-CM | POA: Diagnosis not present

## 2014-12-12 DIAGNOSIS — M19019 Primary osteoarthritis, unspecified shoulder: Secondary | ICD-10-CM | POA: Diagnosis not present

## 2014-12-12 DIAGNOSIS — I509 Heart failure, unspecified: Secondary | ICD-10-CM | POA: Diagnosis not present

## 2014-12-17 DIAGNOSIS — R32 Unspecified urinary incontinence: Secondary | ICD-10-CM | POA: Diagnosis not present

## 2014-12-17 DIAGNOSIS — I509 Heart failure, unspecified: Secondary | ICD-10-CM | POA: Diagnosis not present

## 2014-12-17 DIAGNOSIS — L989 Disorder of the skin and subcutaneous tissue, unspecified: Secondary | ICD-10-CM | POA: Diagnosis not present

## 2014-12-17 DIAGNOSIS — S81802D Unspecified open wound, left lower leg, subsequent encounter: Secondary | ICD-10-CM | POA: Diagnosis not present

## 2014-12-17 DIAGNOSIS — M19019 Primary osteoarthritis, unspecified shoulder: Secondary | ICD-10-CM | POA: Diagnosis not present

## 2014-12-17 DIAGNOSIS — R001 Bradycardia, unspecified: Secondary | ICD-10-CM | POA: Diagnosis not present

## 2014-12-19 DIAGNOSIS — S81802D Unspecified open wound, left lower leg, subsequent encounter: Secondary | ICD-10-CM | POA: Diagnosis not present

## 2014-12-19 DIAGNOSIS — L989 Disorder of the skin and subcutaneous tissue, unspecified: Secondary | ICD-10-CM | POA: Diagnosis not present

## 2014-12-19 DIAGNOSIS — R32 Unspecified urinary incontinence: Secondary | ICD-10-CM | POA: Diagnosis not present

## 2014-12-19 DIAGNOSIS — M19019 Primary osteoarthritis, unspecified shoulder: Secondary | ICD-10-CM | POA: Diagnosis not present

## 2014-12-19 DIAGNOSIS — I509 Heart failure, unspecified: Secondary | ICD-10-CM | POA: Diagnosis not present

## 2014-12-19 DIAGNOSIS — R001 Bradycardia, unspecified: Secondary | ICD-10-CM | POA: Diagnosis not present

## 2014-12-23 DIAGNOSIS — M19019 Primary osteoarthritis, unspecified shoulder: Secondary | ICD-10-CM | POA: Diagnosis not present

## 2014-12-23 DIAGNOSIS — L989 Disorder of the skin and subcutaneous tissue, unspecified: Secondary | ICD-10-CM | POA: Diagnosis not present

## 2014-12-23 DIAGNOSIS — I509 Heart failure, unspecified: Secondary | ICD-10-CM | POA: Diagnosis not present

## 2014-12-23 DIAGNOSIS — S81802D Unspecified open wound, left lower leg, subsequent encounter: Secondary | ICD-10-CM | POA: Diagnosis not present

## 2014-12-23 DIAGNOSIS — R32 Unspecified urinary incontinence: Secondary | ICD-10-CM | POA: Diagnosis not present

## 2014-12-23 DIAGNOSIS — R001 Bradycardia, unspecified: Secondary | ICD-10-CM | POA: Diagnosis not present

## 2014-12-26 DIAGNOSIS — S81802D Unspecified open wound, left lower leg, subsequent encounter: Secondary | ICD-10-CM | POA: Diagnosis not present

## 2014-12-26 DIAGNOSIS — L989 Disorder of the skin and subcutaneous tissue, unspecified: Secondary | ICD-10-CM | POA: Diagnosis not present

## 2014-12-26 DIAGNOSIS — M19019 Primary osteoarthritis, unspecified shoulder: Secondary | ICD-10-CM | POA: Diagnosis not present

## 2014-12-26 DIAGNOSIS — I509 Heart failure, unspecified: Secondary | ICD-10-CM | POA: Diagnosis not present

## 2014-12-26 DIAGNOSIS — R32 Unspecified urinary incontinence: Secondary | ICD-10-CM | POA: Diagnosis not present

## 2014-12-26 DIAGNOSIS — R001 Bradycardia, unspecified: Secondary | ICD-10-CM | POA: Diagnosis not present

## 2014-12-29 ENCOUNTER — Encounter: Payer: Self-pay | Admitting: Internal Medicine

## 2014-12-29 DIAGNOSIS — S81802D Unspecified open wound, left lower leg, subsequent encounter: Secondary | ICD-10-CM | POA: Diagnosis not present

## 2014-12-29 DIAGNOSIS — R32 Unspecified urinary incontinence: Secondary | ICD-10-CM | POA: Diagnosis not present

## 2014-12-29 DIAGNOSIS — R001 Bradycardia, unspecified: Secondary | ICD-10-CM | POA: Diagnosis not present

## 2014-12-29 DIAGNOSIS — L989 Disorder of the skin and subcutaneous tissue, unspecified: Secondary | ICD-10-CM | POA: Diagnosis not present

## 2014-12-29 DIAGNOSIS — M19019 Primary osteoarthritis, unspecified shoulder: Secondary | ICD-10-CM | POA: Diagnosis not present

## 2014-12-29 DIAGNOSIS — I509 Heart failure, unspecified: Secondary | ICD-10-CM | POA: Diagnosis not present

## 2015-01-01 ENCOUNTER — Telehealth: Payer: Self-pay | Admitting: Internal Medicine

## 2015-01-01 DIAGNOSIS — S81802D Unspecified open wound, left lower leg, subsequent encounter: Secondary | ICD-10-CM | POA: Diagnosis not present

## 2015-01-01 DIAGNOSIS — I509 Heart failure, unspecified: Secondary | ICD-10-CM | POA: Diagnosis not present

## 2015-01-01 DIAGNOSIS — L989 Disorder of the skin and subcutaneous tissue, unspecified: Secondary | ICD-10-CM | POA: Diagnosis not present

## 2015-01-01 DIAGNOSIS — M19019 Primary osteoarthritis, unspecified shoulder: Secondary | ICD-10-CM | POA: Diagnosis not present

## 2015-01-01 DIAGNOSIS — R32 Unspecified urinary incontinence: Secondary | ICD-10-CM | POA: Diagnosis not present

## 2015-01-01 DIAGNOSIS — R001 Bradycardia, unspecified: Secondary | ICD-10-CM | POA: Diagnosis not present

## 2015-01-01 NOTE — Telephone Encounter (Signed)
Cindy Graham called about hearing rales in her right lower lobe. Oxygen sats on room air drop to 85% with ambulation at approx 50 feet that come up to above 90 after sitting within  a minute or less as edema in feet and ankles early am. Do you want to increase diuretic short term or get a chest xray. Cindy Graham feels like it's CHF. [place] [phone]. Have a great day!

## 2015-01-05 DIAGNOSIS — M19019 Primary osteoarthritis, unspecified shoulder: Secondary | ICD-10-CM | POA: Diagnosis not present

## 2015-01-05 DIAGNOSIS — L989 Disorder of the skin and subcutaneous tissue, unspecified: Secondary | ICD-10-CM | POA: Diagnosis not present

## 2015-01-05 DIAGNOSIS — I509 Heart failure, unspecified: Secondary | ICD-10-CM | POA: Diagnosis not present

## 2015-01-05 DIAGNOSIS — R001 Bradycardia, unspecified: Secondary | ICD-10-CM | POA: Diagnosis not present

## 2015-01-05 DIAGNOSIS — R32 Unspecified urinary incontinence: Secondary | ICD-10-CM | POA: Diagnosis not present

## 2015-01-05 DIAGNOSIS — S81802D Unspecified open wound, left lower leg, subsequent encounter: Secondary | ICD-10-CM | POA: Diagnosis not present

## 2015-01-06 ENCOUNTER — Telehealth: Payer: Self-pay

## 2015-01-06 NOTE — Telephone Encounter (Signed)
Pt said that she is feeling better and that she didn't need to be seen.

## 2015-01-06 NOTE — Telephone Encounter (Signed)
She needs to be seen. Can she come in today for acute visit?

## 2015-01-07 DIAGNOSIS — I509 Heart failure, unspecified: Secondary | ICD-10-CM | POA: Diagnosis not present

## 2015-01-07 DIAGNOSIS — R001 Bradycardia, unspecified: Secondary | ICD-10-CM | POA: Diagnosis not present

## 2015-01-07 DIAGNOSIS — M19019 Primary osteoarthritis, unspecified shoulder: Secondary | ICD-10-CM | POA: Diagnosis not present

## 2015-01-07 DIAGNOSIS — S81802D Unspecified open wound, left lower leg, subsequent encounter: Secondary | ICD-10-CM | POA: Diagnosis not present

## 2015-01-07 DIAGNOSIS — R32 Unspecified urinary incontinence: Secondary | ICD-10-CM | POA: Diagnosis not present

## 2015-01-07 DIAGNOSIS — L989 Disorder of the skin and subcutaneous tissue, unspecified: Secondary | ICD-10-CM | POA: Diagnosis not present

## 2015-01-07 NOTE — Telephone Encounter (Signed)
She needs to be seen and evaluated. We can get her in tomorrow for evaluation

## 2015-01-07 NOTE — Telephone Encounter (Signed)
Spoke with pt and [facility] nurse, advised both appoint needed.  Verbalized understanding and scheduled appoint

## 2015-01-07 NOTE — Telephone Encounter (Signed)
Linda from [facility] called again.  States pts O2 sats are 94% at rest, 98% upon deep breathing, 86-87% while ambulating.  [name] is requesting an order for a home Chest Xray.  Pt declined appoint when spoke with Caryl Pina on yesterday.  Please advise

## 2015-01-08 ENCOUNTER — Ambulatory Visit (INDEPENDENT_AMBULATORY_CARE_PROVIDER_SITE_OTHER): Payer: Medicare Other | Admitting: Nurse Practitioner

## 2015-01-08 VITALS — BP 118/58 | HR 62 | Temp 98.5°F | Resp 14 | Ht 63.5 in | Wt 188.8 lb

## 2015-01-08 DIAGNOSIS — R059 Cough, unspecified: Secondary | ICD-10-CM

## 2015-01-08 DIAGNOSIS — R05 Cough: Secondary | ICD-10-CM

## 2015-01-08 NOTE — Progress Notes (Signed)
Patient ID: Cindy Graham, female    DOB: 08/05/1921  Age: 81 y.o. MRN: [id number]  CC: Shortness of Breath   HPI Cindy Graham presents for SOB x 7 days.   1) Dropped 94% lowest walking down the hallway today  86%-87% yesterday with ambulating [facility] RN checking her twice weekly and heard rales in RLL Pneumonia in April with same symptoms  Coughed yesterday and day before- non-productive, denies coughing today  History [name] has a past medical history of Hypertension; Hyperlipidemia; Hernia; Urine incontinence; COPD (chronic obstructive pulmonary disease); Arthritis; GERD (gastroesophageal reflux disease); Heart murmur; Syncope and collapse; and Melanoma.   She has past surgical history that includes Abdominal hysterectomy; Cholecystectomy; and Hernia repair.   Her family history includes Cancer in her brother and sister; Heart disease in her brother and sister; Stroke in her father.She reports that she has never smoked. She has never used smokeless tobacco. She reports that she does not drink alcohol or use illicit drugs.  Outpatient Prescriptions Prior to Visit  Medication Sig Dispense Refill  . aspirin 81 MG tablet Take 81 mg by mouth daily.      Marland Kitchen atenolol (TENORMIN) 25 MG tablet TAKE ONE (1) TABLET BY MOUTH EVERY DAY 90 tablet 2  . Biotin 1000 MCG tablet Take 1,000 mcg by mouth daily.      . cholecalciferol (VITAMIN D) 400 UNITS TABS Take 400 Units by mouth 2 (two) times daily.      . enalapril (VASOTEC) 20 MG tablet Take 1 tablet by mouth Twice daily.    . Flaxseed, Linseed, (FLAX SEED OIL) 1000 MG CAPS Take 1 capsule by mouth daily.      Marland Kitchen gabapentin (NEURONTIN) 300 MG capsule TAKE ONE (1) CAPSULE EACH DAY 90 capsule 2  . Multiple Vitamin (MULTIVITAMIN) capsule Take 1 capsule by mouth daily.      Marland Kitchen NITROSTAT 0.3 MG SL tablet DISSOLVE 1 TABLET UNDER TONGUE EVERY 5 MINUTES FOR 3 DOSES AS NEEDED FOR CHEST PAIN. IF NO RELIEF GO TO ER. 100 tablet 1  . NUCYNTA 50 MG TABS tablet Take 50  mg by mouth 2 (two) times daily as needed (Take 1 tablet twice a day as needed).     . nystatin (MYCOSTATIN/NYSTOP) 100000 UNIT/GM POWD Apply 1 application topically BID to affected area until rash resolves 30 g 1  . nystatin cream (MYCOSTATIN) Apply 1 application topically 2 (two) times daily. 30 g 0  . omeprazole (PRILOSEC) 20 MG capsule TAKE ONE (1) CAPSULE EACH DAY 90 capsule 2  . oxybutynin (DITROPAN) 5 MG tablet TAKE ONE (1) TABLET EACH DAY 30 tablet 5  . sertraline (ZOLOFT) 100 MG tablet TAKE ONE (1) TABLET EACH DAY 90 tablet 2  . triamterene-hydrochlorothiazide (DYAZIDE) 37.5-25 MG per capsule TAKE ONE CAPSULE BY MOUTH DAILY 90 capsule 2  . verapamil (CALAN-SR) 180 MG CR tablet TAKE ONE TABLET DAILY AT BEDTIME 90 tablet 1   No facility-administered medications prior to visit.   ROS Review of Systems  Constitutional: Negative for fever, chills, diaphoresis and fatigue.  Respiratory: Positive for cough. Negative for chest tightness, shortness of breath and wheezing.   Cardiovascular: Negative for chest pain, palpitations and leg swelling.  Gastrointestinal: Negative for nausea, vomiting and diarrhea.  Neurological: Negative for dizziness, weakness, numbness and headaches.  Psychiatric/Behavioral: The patient is not nervous/anxious.    Objective:  BP 118/58 mmHg  Pulse 62  Temp(Src) 98.5 F (36.9 C)  Resp 14  Ht 5' 3.5" (1.613 m)  Wt 188 lb 12.8 oz (85.639 kg)  BMI 32.92 kg/m2  SpO2 95%  Physical Exam  Constitutional: She is oriented to person, place, and time. She appears well-developed and well-nourished. No distress.  HENT:  Head: Normocephalic and atraumatic.  Right Ear: External ear normal.  Left Ear: External ear normal.  Cardiovascular: Normal rate and regular rhythm.   Pulmonary/Chest: Effort normal. No respiratory distress. She has no wheezes. She has rales. She exhibits no tenderness.  RLL  Neurological: She is alert and oriented to person, place, and time. No  cranial nerve deficit. She exhibits normal muscle tone. Coordination normal.  Skin: Skin is warm and dry. No rash noted. She is not diaphoretic.  Psychiatric: She has a normal mood and affect. Her behavior is normal. Judgment and thought content normal.    Assessment & Plan:   Raley was seen today for shortness of breath.  Diagnoses and all orders for this visit:  Cough  I am having Ms. Napierkowski maintain her enalapril, aspirin, multivitamin, cholecalciferol, Biotin, Flax Seed Oil, NUCYNTA, NITROSTAT, oxybutynin, nystatin cream, nystatin, omeprazole, sertraline, atenolol, triamterene-hydrochlorothiazide, gabapentin, and verapamil.  No orders of the defined types were placed in this encounter.     Follow-up: Return if symptoms worsen or fail to improve.

## 2015-01-08 NOTE — Progress Notes (Signed)
Pre visit review using our clinic review tool, if applicable. No additional management support is needed unless otherwise documented below in the visit note. 

## 2015-01-09 DIAGNOSIS — R05 Cough: Secondary | ICD-10-CM | POA: Diagnosis not present

## 2015-01-09 DIAGNOSIS — R0989 Other specified symptoms and signs involving the circulatory and respiratory systems: Secondary | ICD-10-CM | POA: Diagnosis not present

## 2015-01-12 ENCOUNTER — Telehealth: Payer: Self-pay | Admitting: Internal Medicine

## 2015-01-12 DIAGNOSIS — S81802D Unspecified open wound, left lower leg, subsequent encounter: Secondary | ICD-10-CM | POA: Diagnosis not present

## 2015-01-12 DIAGNOSIS — I509 Heart failure, unspecified: Secondary | ICD-10-CM | POA: Diagnosis not present

## 2015-01-12 DIAGNOSIS — R001 Bradycardia, unspecified: Secondary | ICD-10-CM | POA: Diagnosis not present

## 2015-01-12 DIAGNOSIS — R32 Unspecified urinary incontinence: Secondary | ICD-10-CM | POA: Diagnosis not present

## 2015-01-12 DIAGNOSIS — M19019 Primary osteoarthritis, unspecified shoulder: Secondary | ICD-10-CM | POA: Diagnosis not present

## 2015-01-12 DIAGNOSIS — L989 Disorder of the skin and subcutaneous tissue, unspecified: Secondary | ICD-10-CM | POA: Diagnosis not present

## 2015-01-12 NOTE — Telephone Encounter (Signed)
Chest x-ray was normal.

## 2015-01-13 ENCOUNTER — Ambulatory Visit: Payer: Medicare Other | Admitting: Internal Medicine

## 2015-01-13 NOTE — Telephone Encounter (Signed)
Notified pt. 

## 2015-01-15 ENCOUNTER — Encounter: Payer: Self-pay | Admitting: Nurse Practitioner

## 2015-01-15 NOTE — Assessment & Plan Note (Signed)
Called on Friday morning [facility] RN doesn't work on Thursdays) to call in verbal order for chest-xray. Pt has recent hx of RLL pneumonia. Will follow after results.

## 2015-01-16 DIAGNOSIS — L989 Disorder of the skin and subcutaneous tissue, unspecified: Secondary | ICD-10-CM | POA: Diagnosis not present

## 2015-01-16 DIAGNOSIS — M19019 Primary osteoarthritis, unspecified shoulder: Secondary | ICD-10-CM | POA: Diagnosis not present

## 2015-01-16 DIAGNOSIS — R32 Unspecified urinary incontinence: Secondary | ICD-10-CM | POA: Diagnosis not present

## 2015-01-16 DIAGNOSIS — R001 Bradycardia, unspecified: Secondary | ICD-10-CM | POA: Diagnosis not present

## 2015-01-16 DIAGNOSIS — S81802D Unspecified open wound, left lower leg, subsequent encounter: Secondary | ICD-10-CM | POA: Diagnosis not present

## 2015-01-16 DIAGNOSIS — I509 Heart failure, unspecified: Secondary | ICD-10-CM | POA: Diagnosis not present

## 2015-01-19 ENCOUNTER — Other Ambulatory Visit: Payer: Self-pay | Admitting: Internal Medicine

## 2015-01-20 DIAGNOSIS — R32 Unspecified urinary incontinence: Secondary | ICD-10-CM | POA: Diagnosis not present

## 2015-01-20 DIAGNOSIS — S81802D Unspecified open wound, left lower leg, subsequent encounter: Secondary | ICD-10-CM | POA: Diagnosis not present

## 2015-01-20 DIAGNOSIS — I509 Heart failure, unspecified: Secondary | ICD-10-CM | POA: Diagnosis not present

## 2015-01-20 DIAGNOSIS — M2041 Other hammer toe(s) (acquired), right foot: Secondary | ICD-10-CM | POA: Diagnosis not present

## 2015-01-20 DIAGNOSIS — L989 Disorder of the skin and subcutaneous tissue, unspecified: Secondary | ICD-10-CM | POA: Diagnosis not present

## 2015-01-20 DIAGNOSIS — R001 Bradycardia, unspecified: Secondary | ICD-10-CM | POA: Diagnosis not present

## 2015-01-20 DIAGNOSIS — I739 Peripheral vascular disease, unspecified: Secondary | ICD-10-CM | POA: Diagnosis not present

## 2015-01-20 DIAGNOSIS — M19019 Primary osteoarthritis, unspecified shoulder: Secondary | ICD-10-CM | POA: Diagnosis not present

## 2015-01-20 DIAGNOSIS — L97511 Non-pressure chronic ulcer of other part of right foot limited to breakdown of skin: Secondary | ICD-10-CM | POA: Diagnosis not present

## 2015-01-22 DIAGNOSIS — R001 Bradycardia, unspecified: Secondary | ICD-10-CM | POA: Diagnosis not present

## 2015-01-22 DIAGNOSIS — R32 Unspecified urinary incontinence: Secondary | ICD-10-CM | POA: Diagnosis not present

## 2015-01-22 DIAGNOSIS — S81802D Unspecified open wound, left lower leg, subsequent encounter: Secondary | ICD-10-CM | POA: Diagnosis not present

## 2015-01-22 DIAGNOSIS — L989 Disorder of the skin and subcutaneous tissue, unspecified: Secondary | ICD-10-CM | POA: Diagnosis not present

## 2015-01-22 DIAGNOSIS — I509 Heart failure, unspecified: Secondary | ICD-10-CM | POA: Diagnosis not present

## 2015-01-22 DIAGNOSIS — M19019 Primary osteoarthritis, unspecified shoulder: Secondary | ICD-10-CM | POA: Diagnosis not present

## 2015-01-26 DIAGNOSIS — M19019 Primary osteoarthritis, unspecified shoulder: Secondary | ICD-10-CM | POA: Diagnosis not present

## 2015-01-26 DIAGNOSIS — S81802D Unspecified open wound, left lower leg, subsequent encounter: Secondary | ICD-10-CM | POA: Diagnosis not present

## 2015-01-26 DIAGNOSIS — R32 Unspecified urinary incontinence: Secondary | ICD-10-CM | POA: Diagnosis not present

## 2015-01-26 DIAGNOSIS — I509 Heart failure, unspecified: Secondary | ICD-10-CM | POA: Diagnosis not present

## 2015-01-26 DIAGNOSIS — R001 Bradycardia, unspecified: Secondary | ICD-10-CM | POA: Diagnosis not present

## 2015-01-26 DIAGNOSIS — L989 Disorder of the skin and subcutaneous tissue, unspecified: Secondary | ICD-10-CM | POA: Diagnosis not present

## 2015-01-27 ENCOUNTER — Telehealth: Payer: Self-pay | Admitting: Internal Medicine

## 2015-01-27 DIAGNOSIS — R001 Bradycardia, unspecified: Secondary | ICD-10-CM | POA: Diagnosis not present

## 2015-01-27 DIAGNOSIS — L989 Disorder of the skin and subcutaneous tissue, unspecified: Secondary | ICD-10-CM | POA: Diagnosis not present

## 2015-01-27 DIAGNOSIS — S81802D Unspecified open wound, left lower leg, subsequent encounter: Secondary | ICD-10-CM | POA: Diagnosis not present

## 2015-01-27 DIAGNOSIS — M19019 Primary osteoarthritis, unspecified shoulder: Secondary | ICD-10-CM | POA: Diagnosis not present

## 2015-01-27 DIAGNOSIS — I509 Heart failure, unspecified: Secondary | ICD-10-CM | POA: Diagnosis not present

## 2015-01-27 DIAGNOSIS — R32 Unspecified urinary incontinence: Secondary | ICD-10-CM | POA: Diagnosis not present

## 2015-01-27 NOTE — Telephone Encounter (Signed)
Cindy Graham with [facility] called about pt Blood pressure was low yesterday in left arm was 102/50 and right arm 122/70. Today was left arm sitting 108/48 standing 124/60, right arm sitting 120/50 standing 148/64 no symptoms. Pt states she did not have to get up to urinate at all last night. Cindy Graham wants to know if pt needs to continued to be monitored? Pt is having new pain in left hip and groaning. Pt see's Dr Lubertha Sayres tomorrow. Cindy Graham call back [phone]. Thank You!

## 2015-01-27 NOTE — Telephone Encounter (Signed)
Spoke with Cindy Graham, she states pt is ok with keeping her appoint with Dr Sharlet Salina tomorrow.  States the pain is positional.

## 2015-01-27 NOTE — Telephone Encounter (Signed)
If she is having severe pain, perhaps they can ask if Dr. Sharlet Salina can see her today?

## 2015-01-28 DIAGNOSIS — M5416 Radiculopathy, lumbar region: Secondary | ICD-10-CM | POA: Diagnosis not present

## 2015-01-28 DIAGNOSIS — M5126 Other intervertebral disc displacement, lumbar region: Secondary | ICD-10-CM | POA: Diagnosis not present

## 2015-01-28 DIAGNOSIS — M25552 Pain in left hip: Secondary | ICD-10-CM | POA: Diagnosis not present

## 2015-01-28 DIAGNOSIS — M1612 Unilateral primary osteoarthritis, left hip: Secondary | ICD-10-CM | POA: Diagnosis not present

## 2015-01-30 DIAGNOSIS — M19019 Primary osteoarthritis, unspecified shoulder: Secondary | ICD-10-CM | POA: Diagnosis not present

## 2015-01-30 DIAGNOSIS — M25552 Pain in left hip: Secondary | ICD-10-CM | POA: Diagnosis not present

## 2015-01-30 DIAGNOSIS — M2041 Other hammer toe(s) (acquired), right foot: Secondary | ICD-10-CM | POA: Diagnosis not present

## 2015-01-30 DIAGNOSIS — Z87440 Personal history of urinary (tract) infections: Secondary | ICD-10-CM | POA: Diagnosis not present

## 2015-01-30 DIAGNOSIS — R001 Bradycardia, unspecified: Secondary | ICD-10-CM | POA: Diagnosis not present

## 2015-01-30 DIAGNOSIS — L989 Disorder of the skin and subcutaneous tissue, unspecified: Secondary | ICD-10-CM | POA: Diagnosis not present

## 2015-01-30 DIAGNOSIS — I509 Heart failure, unspecified: Secondary | ICD-10-CM | POA: Diagnosis not present

## 2015-01-30 DIAGNOSIS — M79606 Pain in leg, unspecified: Secondary | ICD-10-CM | POA: Diagnosis not present

## 2015-01-30 DIAGNOSIS — Z9981 Dependence on supplemental oxygen: Secondary | ICD-10-CM | POA: Diagnosis not present

## 2015-01-30 DIAGNOSIS — R32 Unspecified urinary incontinence: Secondary | ICD-10-CM | POA: Diagnosis not present

## 2015-02-02 DIAGNOSIS — M79606 Pain in leg, unspecified: Secondary | ICD-10-CM | POA: Diagnosis not present

## 2015-02-02 DIAGNOSIS — I509 Heart failure, unspecified: Secondary | ICD-10-CM | POA: Diagnosis not present

## 2015-02-02 DIAGNOSIS — M19019 Primary osteoarthritis, unspecified shoulder: Secondary | ICD-10-CM | POA: Diagnosis not present

## 2015-02-02 DIAGNOSIS — M25552 Pain in left hip: Secondary | ICD-10-CM | POA: Diagnosis not present

## 2015-02-02 DIAGNOSIS — M2041 Other hammer toe(s) (acquired), right foot: Secondary | ICD-10-CM | POA: Diagnosis not present

## 2015-02-02 DIAGNOSIS — L989 Disorder of the skin and subcutaneous tissue, unspecified: Secondary | ICD-10-CM | POA: Diagnosis not present

## 2015-02-04 DIAGNOSIS — I509 Heart failure, unspecified: Secondary | ICD-10-CM | POA: Diagnosis not present

## 2015-02-04 DIAGNOSIS — M19019 Primary osteoarthritis, unspecified shoulder: Secondary | ICD-10-CM | POA: Diagnosis not present

## 2015-02-04 DIAGNOSIS — M25552 Pain in left hip: Secondary | ICD-10-CM | POA: Diagnosis not present

## 2015-02-04 DIAGNOSIS — M79606 Pain in leg, unspecified: Secondary | ICD-10-CM | POA: Diagnosis not present

## 2015-02-04 DIAGNOSIS — M2041 Other hammer toe(s) (acquired), right foot: Secondary | ICD-10-CM | POA: Diagnosis not present

## 2015-02-04 DIAGNOSIS — L989 Disorder of the skin and subcutaneous tissue, unspecified: Secondary | ICD-10-CM | POA: Diagnosis not present

## 2015-02-10 ENCOUNTER — Telehealth: Payer: Self-pay | Admitting: Internal Medicine

## 2015-02-10 DIAGNOSIS — M19019 Primary osteoarthritis, unspecified shoulder: Secondary | ICD-10-CM | POA: Diagnosis not present

## 2015-02-10 DIAGNOSIS — M2041 Other hammer toe(s) (acquired), right foot: Secondary | ICD-10-CM | POA: Diagnosis not present

## 2015-02-10 DIAGNOSIS — M79606 Pain in leg, unspecified: Secondary | ICD-10-CM | POA: Diagnosis not present

## 2015-02-10 DIAGNOSIS — M25552 Pain in left hip: Secondary | ICD-10-CM | POA: Diagnosis not present

## 2015-02-10 DIAGNOSIS — I509 Heart failure, unspecified: Secondary | ICD-10-CM | POA: Diagnosis not present

## 2015-02-10 DIAGNOSIS — L989 Disorder of the skin and subcutaneous tissue, unspecified: Secondary | ICD-10-CM | POA: Diagnosis not present

## 2015-02-11 ENCOUNTER — Ambulatory Visit (INDEPENDENT_AMBULATORY_CARE_PROVIDER_SITE_OTHER): Payer: Medicare Other | Admitting: Nurse Practitioner

## 2015-02-11 ENCOUNTER — Encounter: Payer: Self-pay | Admitting: Nurse Practitioner

## 2015-02-11 VITALS — BP 138/62 | HR 67 | Temp 98.2°F | Resp 14 | Ht 63.5 in | Wt 187.8 lb

## 2015-02-11 DIAGNOSIS — I10 Essential (primary) hypertension: Secondary | ICD-10-CM | POA: Diagnosis not present

## 2015-02-11 DIAGNOSIS — M549 Dorsalgia, unspecified: Secondary | ICD-10-CM | POA: Diagnosis not present

## 2015-02-11 DIAGNOSIS — G8929 Other chronic pain: Secondary | ICD-10-CM | POA: Diagnosis not present

## 2015-02-11 NOTE — Progress Notes (Signed)
Patient ID: Cindy Graham, female    DOB: 05-13-1922  Age: 81 y.o. MRN: [id number]  CC: Hypertension   HPI SALEEN PEDEN presents for HTN x 1 week.   1) Elevated BPs this week 150/ 80-90's listed by Home health nurse. Pt reports 12 days of prednisone due to back pain that has since resolved. Last dose yesterday. Weight is stable and pulse, too. No dizziness this morning.   Dizziness- a few mornings when sitting up on side of the bed from awakening. Then it resolves by the time she has been to the bathroom. No room spinning, just feels discomfort.   History Makai has a past medical history of Hypertension; Hyperlipidemia; Hernia; Urine incontinence; COPD (chronic obstructive pulmonary disease); Arthritis; GERD (gastroesophageal reflux disease); Heart murmur; Syncope and collapse; and Melanoma.   She has past surgical history that includes Abdominal hysterectomy; Cholecystectomy; and Hernia repair.   Her family history includes Cancer in her brother and sister; Heart disease in her brother and sister; Stroke in her father.She reports that she has never smoked. She has never used smokeless tobacco. She reports that she does not drink alcohol or use illicit drugs.  Outpatient Prescriptions Prior to Visit  Medication Sig Dispense Refill  . aspirin 81 MG tablet Take 81 mg by mouth daily.      Marland Kitchen atenolol (TENORMIN) 25 MG tablet TAKE ONE (1) TABLET BY MOUTH EVERY DAY 90 tablet 2  . Biotin 1000 MCG tablet Take 1,000 mcg by mouth daily.      . cholecalciferol (VITAMIN D) 400 UNITS TABS Take 400 Units by mouth 2 (two) times daily.      . enalapril (VASOTEC) 20 MG tablet Take 1 tablet by mouth Twice daily.    . Flaxseed, Linseed, (FLAX SEED OIL) 1000 MG CAPS Take 1 capsule by mouth daily.      Marland Kitchen gabapentin (NEURONTIN) 300 MG capsule TAKE ONE (1) CAPSULE EACH DAY 90 capsule 2  . Multiple Vitamin (MULTIVITAMIN) capsule Take 1 capsule by mouth daily.      Marland Kitchen NITROSTAT 0.3 MG SL tablet DISSOLVE 1 TABLET  UNDER TONGUE EVERY 5 MINUTES FOR 3 DOSES AS NEEDED FOR CHEST PAIN. IF NO RELIEF GO TO ER. 100 tablet 1  . NUCYNTA 50 MG TABS tablet Take 50 mg by mouth 2 (two) times daily as needed (Take 1 tablet twice a day as needed).     . nystatin (MYCOSTATIN/NYSTOP) 100000 UNIT/GM POWD Apply 1 application topically BID to affected area until rash resolves 30 g 1  . nystatin cream (MYCOSTATIN) Apply 1 application topically 2 (two) times daily. 30 g 0  . omeprazole (PRILOSEC) 20 MG capsule TAKE ONE (1) CAPSULE EACH DAY 90 capsule 2  . oxybutynin (DITROPAN) 5 MG tablet TAKE ONE TABLET EVERY DAY 30 tablet 6  . sertraline (ZOLOFT) 100 MG tablet TAKE ONE (1) TABLET EACH DAY 90 tablet 2  . triamterene-hydrochlorothiazide (DYAZIDE) 37.5-25 MG per capsule TAKE ONE CAPSULE BY MOUTH DAILY 90 capsule 2  . verapamil (CALAN-SR) 180 MG CR tablet TAKE ONE TABLET DAILY AT BEDTIME 90 tablet 1   No facility-administered medications prior to visit.    ROS Review of Systems  Constitutional: Negative for fever, chills, diaphoresis and fatigue.  Respiratory: Negative for chest tightness, shortness of breath and wheezing.   Cardiovascular: Negative for chest pain, palpitations and leg swelling.  Gastrointestinal: Negative for nausea, vomiting and diarrhea.  Skin: Negative for rash.  Neurological: Positive for light-headedness. Negative for dizziness, numbness and  headaches.    Objective:  BP 138/62 mmHg  Pulse 67  Temp(Src) 98.2 F (36.8 C)  Resp 14  Ht 5' 3.5" (1.613 m)  Wt 187 lb 12.8 oz (85.186 kg)  BMI 32.74 kg/m2  SpO2 96%  Physical Exam  Constitutional: She is oriented to person, place, and time. She appears well-developed and well-nourished. No distress.  HENT:  Head: Normocephalic and atraumatic.  Right Ear: External ear normal.  Left Ear: External ear normal.  Cardiovascular: Normal rate, regular rhythm and normal heart sounds.  Exam reveals no gallop and no friction rub.   No murmur  heard. Pulmonary/Chest: Effort normal and breath sounds normal. No respiratory distress. She has no wheezes. She has no rales. She exhibits no tenderness.  Neurological: She is alert and oriented to person, place, and time. No cranial nerve deficit. She exhibits normal muscle tone. Coordination normal.  Skin: Skin is warm and dry. No rash noted. She is not diaphoretic.  Psychiatric: She has a normal mood and affect. Her behavior is normal. Judgment and thought content normal.   Assessment & Plan:   Jenasia was seen today for hypertension.  Diagnoses and all orders for this visit:  Chronic back pain greater than 3 months duration  Essential hypertension  I am having Ms. Mcanany maintain her enalapril, aspirin, multivitamin, cholecalciferol, Biotin, Flax Seed Oil, NUCYNTA, NITROSTAT, nystatin cream, nystatin, omeprazole, sertraline, atenolol, triamterene-hydrochlorothiazide, gabapentin, verapamil, and oxybutynin.  No orders of the defined types were placed in this encounter.     Follow-up: Return if symptoms worsen or fail to improve.

## 2015-02-11 NOTE — Progress Notes (Signed)
Pre visit review using our clinic review tool, if applicable. No additional management support is needed unless otherwise documented below in the visit note. 

## 2015-02-11 NOTE — Assessment & Plan Note (Signed)
Pt had recent prednisone taper over 12 days. Last dosage yesterday. Pt reported elevated BP that has resolved by OV. Back pain is much improved she reports. FU next month with Dr. Gilford Rile

## 2015-02-11 NOTE — Assessment & Plan Note (Signed)
BP Readings from Last 3 Encounters:  02/11/15 138/62  01/08/15 118/58  12/04/14 133/68   Pt controlled in office. Light-headedness resolved this morning. Last dose of prednisone yesterday from 12 days for back pain. HH RN had BP of 190/80 this morning standing. In office it was normal.

## 2015-02-17 DIAGNOSIS — L97511 Non-pressure chronic ulcer of other part of right foot limited to breakdown of skin: Secondary | ICD-10-CM | POA: Diagnosis not present

## 2015-02-17 DIAGNOSIS — I739 Peripheral vascular disease, unspecified: Secondary | ICD-10-CM | POA: Diagnosis not present

## 2015-02-17 DIAGNOSIS — M2041 Other hammer toe(s) (acquired), right foot: Secondary | ICD-10-CM | POA: Diagnosis not present

## 2015-02-18 DIAGNOSIS — I509 Heart failure, unspecified: Secondary | ICD-10-CM | POA: Diagnosis not present

## 2015-02-18 DIAGNOSIS — M2041 Other hammer toe(s) (acquired), right foot: Secondary | ICD-10-CM | POA: Diagnosis not present

## 2015-02-18 DIAGNOSIS — M79606 Pain in leg, unspecified: Secondary | ICD-10-CM | POA: Diagnosis not present

## 2015-02-18 DIAGNOSIS — L989 Disorder of the skin and subcutaneous tissue, unspecified: Secondary | ICD-10-CM | POA: Diagnosis not present

## 2015-02-18 DIAGNOSIS — M25552 Pain in left hip: Secondary | ICD-10-CM | POA: Diagnosis not present

## 2015-02-18 DIAGNOSIS — M19019 Primary osteoarthritis, unspecified shoulder: Secondary | ICD-10-CM | POA: Diagnosis not present

## 2015-02-20 DIAGNOSIS — I509 Heart failure, unspecified: Secondary | ICD-10-CM | POA: Diagnosis not present

## 2015-02-20 DIAGNOSIS — I10 Essential (primary) hypertension: Secondary | ICD-10-CM | POA: Diagnosis not present

## 2015-02-20 DIAGNOSIS — L989 Disorder of the skin and subcutaneous tissue, unspecified: Secondary | ICD-10-CM | POA: Diagnosis not present

## 2015-02-20 DIAGNOSIS — M79606 Pain in leg, unspecified: Secondary | ICD-10-CM | POA: Diagnosis not present

## 2015-02-20 DIAGNOSIS — M25552 Pain in left hip: Secondary | ICD-10-CM | POA: Diagnosis not present

## 2015-02-20 DIAGNOSIS — M19019 Primary osteoarthritis, unspecified shoulder: Secondary | ICD-10-CM | POA: Diagnosis not present

## 2015-02-20 DIAGNOSIS — M2041 Other hammer toe(s) (acquired), right foot: Secondary | ICD-10-CM | POA: Diagnosis not present

## 2015-02-20 NOTE — Telephone Encounter (Signed)
Heart rate variability of 67-70 would be normal.  We can have them check an EKG if the nurse is adamant or concerned about irregular heart rate. She should monitor her BP and weight daily. We may need to increase Lasix.

## 2015-02-20 NOTE — Telephone Encounter (Signed)
Patient has an irregular irregularity of the heart rate today 67-73 , Cindy Graham has no record of this happening before the patient states that it has happen before some years ago. She has no symptoms of chest pain or increased SOB or dizziness , but does have increased edema in her ankles. This is not something she needs to go to the ER for can they have a n EKG done in the home and an increased visits to monitor her status . Weight is up 2 pounds in the last week. Needing a call back today.

## 2015-02-20 NOTE — Telephone Encounter (Signed)
Please advise 

## 2015-02-20 NOTE — Telephone Encounter (Signed)
Spoke with linda from [facility].  Verbalized understanding and will call back if symptoms change.

## 2015-02-23 DIAGNOSIS — L989 Disorder of the skin and subcutaneous tissue, unspecified: Secondary | ICD-10-CM | POA: Diagnosis not present

## 2015-02-23 DIAGNOSIS — M19019 Primary osteoarthritis, unspecified shoulder: Secondary | ICD-10-CM | POA: Diagnosis not present

## 2015-02-23 DIAGNOSIS — M79606 Pain in leg, unspecified: Secondary | ICD-10-CM | POA: Diagnosis not present

## 2015-02-23 DIAGNOSIS — I509 Heart failure, unspecified: Secondary | ICD-10-CM | POA: Diagnosis not present

## 2015-02-23 DIAGNOSIS — M2041 Other hammer toe(s) (acquired), right foot: Secondary | ICD-10-CM | POA: Diagnosis not present

## 2015-02-23 DIAGNOSIS — M25552 Pain in left hip: Secondary | ICD-10-CM | POA: Diagnosis not present

## 2015-02-27 DIAGNOSIS — M19019 Primary osteoarthritis, unspecified shoulder: Secondary | ICD-10-CM | POA: Diagnosis not present

## 2015-02-27 DIAGNOSIS — M25552 Pain in left hip: Secondary | ICD-10-CM | POA: Diagnosis not present

## 2015-02-27 DIAGNOSIS — M79606 Pain in leg, unspecified: Secondary | ICD-10-CM | POA: Diagnosis not present

## 2015-02-27 DIAGNOSIS — L989 Disorder of the skin and subcutaneous tissue, unspecified: Secondary | ICD-10-CM | POA: Diagnosis not present

## 2015-02-27 DIAGNOSIS — I509 Heart failure, unspecified: Secondary | ICD-10-CM | POA: Diagnosis not present

## 2015-02-27 DIAGNOSIS — M2041 Other hammer toe(s) (acquired), right foot: Secondary | ICD-10-CM | POA: Diagnosis not present

## 2015-03-03 DIAGNOSIS — M2041 Other hammer toe(s) (acquired), right foot: Secondary | ICD-10-CM | POA: Diagnosis not present

## 2015-03-03 DIAGNOSIS — M79606 Pain in leg, unspecified: Secondary | ICD-10-CM | POA: Diagnosis not present

## 2015-03-03 DIAGNOSIS — M25552 Pain in left hip: Secondary | ICD-10-CM | POA: Diagnosis not present

## 2015-03-03 DIAGNOSIS — I509 Heart failure, unspecified: Secondary | ICD-10-CM | POA: Diagnosis not present

## 2015-03-03 DIAGNOSIS — L989 Disorder of the skin and subcutaneous tissue, unspecified: Secondary | ICD-10-CM | POA: Diagnosis not present

## 2015-03-03 DIAGNOSIS — M19019 Primary osteoarthritis, unspecified shoulder: Secondary | ICD-10-CM | POA: Diagnosis not present

## 2015-03-04 DIAGNOSIS — M5416 Radiculopathy, lumbar region: Secondary | ICD-10-CM | POA: Diagnosis not present

## 2015-03-04 DIAGNOSIS — M1612 Unilateral primary osteoarthritis, left hip: Secondary | ICD-10-CM | POA: Diagnosis not present

## 2015-03-04 DIAGNOSIS — M5126 Other intervertebral disc displacement, lumbar region: Secondary | ICD-10-CM | POA: Diagnosis not present

## 2015-03-06 ENCOUNTER — Ambulatory Visit (INDEPENDENT_AMBULATORY_CARE_PROVIDER_SITE_OTHER): Payer: Medicare Other | Admitting: Internal Medicine

## 2015-03-06 ENCOUNTER — Encounter: Payer: Self-pay | Admitting: Internal Medicine

## 2015-03-06 VITALS — BP 131/67 | HR 73 | Temp 98.1°F | Ht 64.0 in | Wt 190.0 lb

## 2015-03-06 DIAGNOSIS — R2681 Unsteadiness on feet: Secondary | ICD-10-CM

## 2015-03-06 DIAGNOSIS — R002 Palpitations: Secondary | ICD-10-CM | POA: Insufficient documentation

## 2015-03-06 DIAGNOSIS — I10 Essential (primary) hypertension: Secondary | ICD-10-CM

## 2015-03-06 DIAGNOSIS — R300 Dysuria: Secondary | ICD-10-CM | POA: Diagnosis not present

## 2015-03-06 DIAGNOSIS — G8929 Other chronic pain: Secondary | ICD-10-CM | POA: Diagnosis not present

## 2015-03-06 DIAGNOSIS — M549 Dorsalgia, unspecified: Secondary | ICD-10-CM | POA: Diagnosis not present

## 2015-03-06 DIAGNOSIS — R269 Unspecified abnormalities of gait and mobility: Secondary | ICD-10-CM | POA: Diagnosis not present

## 2015-03-06 LAB — CBC
HCT: 38.5 % (ref 36.0–46.0)
HEMOGLOBIN: 12.8 g/dL (ref 12.0–15.0)
MCHC: 33.1 g/dL (ref 30.0–36.0)
MCV: 94.7 fl (ref 78.0–100.0)
PLATELETS: 340 10*3/uL (ref 150.0–400.0)
RBC: 4.07 Mil/uL (ref 3.87–5.11)
RDW: 15.3 % (ref 11.5–15.5)
WBC: 9 10*3/uL (ref 4.0–10.5)

## 2015-03-06 LAB — COMPREHENSIVE METABOLIC PANEL
ALBUMIN: 3.7 g/dL (ref 3.5–5.2)
ALK PHOS: 64 U/L (ref 39–117)
ALT: 12 U/L (ref 0–35)
AST: 22 U/L (ref 0–37)
BUN: 23 mg/dL (ref 6–23)
CALCIUM: 9.6 mg/dL (ref 8.4–10.5)
CO2: 27 mEq/L (ref 19–32)
Chloride: 99 mEq/L (ref 96–112)
Creatinine, Ser: 1.19 mg/dL (ref 0.40–1.20)
GFR: 45.13 mL/min — ABNORMAL LOW (ref >60.00)
Glucose, Bld: 87 mg/dL (ref 70–99)
POTASSIUM: 4.4 meq/L (ref 3.5–5.1)
Sodium: 136 mEq/L (ref 135–145)
Total Bilirubin: 0.4 mg/dL (ref 0.2–1.2)
Total Protein: 6.9 g/dL (ref 6.0–8.3)

## 2015-03-06 LAB — TSH: TSH: 3.2 u[IU]/mL (ref 0.35–4.50)

## 2015-03-06 LAB — POCT URINALYSIS DIPSTICK
BILIRUBIN UA: NEGATIVE
Blood, UA: NEGATIVE
Glucose, UA: NEGATIVE
Ketones, UA: NEGATIVE
NITRITE UA: NEGATIVE
Protein, UA: NEGATIVE
Spec Grav, UA: 1.015
UROBILINOGEN UA: 0.2
pH, UA: 6.5

## 2015-03-06 NOTE — Assessment & Plan Note (Signed)
Recently had dose of Nucynta increased by Dr. Sharlet Salina. Will follow.

## 2015-03-06 NOTE — Patient Instructions (Signed)
Labs today.  Please call if any persistent symptoms of palpitations.  Follow up in 3 months and sooner as needed.

## 2015-03-06 NOTE — Addendum Note (Signed)
Addended by: Karlene Einstein D on: 03/06/2015 02:40 PM   Modules accepted: Orders

## 2015-03-06 NOTE — Progress Notes (Signed)
Pre visit review using our clinic review tool, if applicable. No additional management support is needed unless otherwise documented below in the visit note. 

## 2015-03-06 NOTE — Assessment & Plan Note (Addendum)
Intermittent palpitations. Reviewed EKG from home health which showed NSR. She as RRR on exam. Will repeat EKG here today stable with NSR. Consider referral back to cardiology if persistent symptoms.

## 2015-03-06 NOTE — Assessment & Plan Note (Signed)
Continue use of cane or Cavon Nicolls for falls prevention.

## 2015-03-06 NOTE — Assessment & Plan Note (Signed)
UA today

## 2015-03-06 NOTE — Assessment & Plan Note (Signed)
BP Readings from Last 3 Encounters:  03/06/15 131/67  02/11/15 138/62  01/08/15 118/58   BP well controlled. Repeat renal function with labs. Continue current medications.

## 2015-03-06 NOTE — Progress Notes (Signed)
Subjective:    Patient ID: Cindy Graham, female    DOB: 29-Jan-1922, 81 y.o.   MRN: [id number]  HPI  81YO female presents for follow up.  Worried she may have UTI. Notes foul smell to urine and burning with urination. No fever, chills, flank pain.  Chronic pain - Recently had pain medication increased to control pain in left hip. Continues to have aching pain in left hip that radiates around to lower abdomen. Also pain in knees and lower back described as aching. Pain improved significantly with Nucynta. No side effects noted.  Home health nurse was concerned about irregular HR. EKG showed NSR. Has occasional palpitations. Longstanding. No chest pain. No dyspnea.  Son is worried about her care and safety in current living situation. Would like to look into long term ALF or SNF.  Wt Readings from Last 3 Encounters:  03/06/15 190 lb (86.183 kg)  02/11/15 187 lb 12.8 oz (85.186 kg)  01/08/15 188 lb 12.8 oz (85.639 kg)   BP Readings from Last 3 Encounters:  03/06/15 131/67  02/11/15 138/62  01/08/15 118/58    Past Medical History  Diagnosis Date  . Hypertension   . Hyperlipidemia   . Hernia   . Urine incontinence   . COPD (chronic obstructive pulmonary disease) ([place])   . Arthritis     osteo  . GERD (gastroesophageal reflux disease)   . Heart murmur   . Syncope and collapse   . Melanoma ([place])     face   Family History  Problem Relation Age of Onset  . Stroke Father   . Heart disease Sister   . Cancer Brother     colon  . Heart disease Brother   . Cancer Sister     colon   Past Surgical History  Procedure Laterality Date  . Abdominal hysterectomy    . Cholecystectomy    . Hernia repair     Social History   Social History  . Marital Status: Married    Spouse Name: N/A  . Number of Children: N/A  . Years of Education: N/A   Social History Main Topics  . Smoking status: Never Smoker   . Smokeless tobacco: Never Used  . Alcohol Use: No  . Drug Use: No    . Sexual Activity: Not Asked   Other Topics Concern  . None   Social History Narrative    Review of Systems  Constitutional: Negative for fever, chills, appetite change, fatigue and unexpected weight change.  Eyes: Negative for visual disturbance.  Respiratory: Negative for cough and shortness of breath.   Cardiovascular: Positive for palpitations. Negative for chest pain and leg swelling.  Gastrointestinal: Negative for abdominal pain, diarrhea and constipation.  Genitourinary: Positive for dysuria. Negative for urgency, frequency, hematuria, flank pain, vaginal pain and pelvic pain.  Musculoskeletal: Positive for myalgias, back pain, arthralgias and gait problem.  Skin: Negative for color change and rash.  Hematological: Negative for adenopathy. Does not bruise/bleed easily.  Psychiatric/Behavioral: Negative for dysphoric mood. The patient is not nervous/anxious.        Objective:    BP 131/67 mmHg  Pulse 73  Temp(Src) 98.1 F (36.7 C) (Oral)  Ht 5\' 4"  (1.626 m)  Wt 190 lb (86.183 kg)  BMI 32.60 kg/m2  SpO2 95% Physical Exam  Constitutional: She is oriented to person, place, and time. She appears well-developed and well-nourished. No distress.  HENT:  Head: Normocephalic and atraumatic.  Right Ear: External ear normal.  Left  Ear: External ear normal.  Nose: Nose normal.  Mouth/Throat: Oropharynx is clear and moist. No oropharyngeal exudate.  Eyes: Conjunctivae are normal. Pupils are equal, round, and reactive to light. Right eye exhibits no discharge. Left eye exhibits no discharge. No scleral icterus.  Neck: Normal range of motion. Neck supple. No tracheal deviation present. No thyromegaly present.  Cardiovascular: Normal rate, regular rhythm, normal heart sounds and intact distal pulses.  Exam reveals no gallop and no friction rub.   No murmur heard. Pulmonary/Chest: Effort normal and breath sounds normal. No respiratory distress. She has no wheezes. She has no  rales. She exhibits no tenderness.  Musculoskeletal: Normal range of motion. She exhibits no edema or tenderness.  Lymphadenopathy:    She has no cervical adenopathy.  Neurological: She is alert and oriented to person, place, and time. No cranial nerve deficit. She exhibits normal muscle tone. Coordination normal.  Skin: Skin is warm and dry. No rash noted. She is not diaphoretic. No erythema. No pallor.  Psychiatric: She has a normal mood and affect. Her behavior is normal. Judgment and thought content normal.          Assessment & Plan:   Problem List Items Addressed This Visit      Unprioritized   Chronic back pain greater than 3 months duration    Recently had dose of Nucynta increased by Dr. Sharlet Salina. Will follow.      Dysuria    UA today.      Relevant Orders   POCT urinalysis dipstick   Gait instability    Continue use of cane or walker for falls prevention.      Hypertension - Primary    BP Readings from Last 3 Encounters:  03/06/15 131/67  02/11/15 138/62  01/08/15 118/58   BP well controlled. Repeat renal function with labs. Continue current medications.      Palpitations    Intermittent palpitations. Reviewed EKG from home health which showed NSR. She as RRR on exam. Will repeat EKG here today stable with NSR. Consider referral back to cardiology if persistent symptoms.      Relevant Orders   Comprehensive metabolic panel   TSH   CBC   EKG 12-Lead (Completed)       Return in about 3 months (around 06/06/2015) for Recheck.

## 2015-03-07 LAB — URINE CULTURE
Colony Count: NO GROWTH
Organism ID, Bacteria: NO GROWTH

## 2015-03-10 DIAGNOSIS — M79606 Pain in leg, unspecified: Secondary | ICD-10-CM | POA: Diagnosis not present

## 2015-03-10 DIAGNOSIS — M19019 Primary osteoarthritis, unspecified shoulder: Secondary | ICD-10-CM | POA: Diagnosis not present

## 2015-03-10 DIAGNOSIS — L989 Disorder of the skin and subcutaneous tissue, unspecified: Secondary | ICD-10-CM | POA: Diagnosis not present

## 2015-03-10 DIAGNOSIS — I509 Heart failure, unspecified: Secondary | ICD-10-CM | POA: Diagnosis not present

## 2015-03-10 DIAGNOSIS — M2041 Other hammer toe(s) (acquired), right foot: Secondary | ICD-10-CM | POA: Diagnosis not present

## 2015-03-10 DIAGNOSIS — M25552 Pain in left hip: Secondary | ICD-10-CM | POA: Diagnosis not present

## 2015-03-19 DIAGNOSIS — I509 Heart failure, unspecified: Secondary | ICD-10-CM | POA: Diagnosis not present

## 2015-03-19 DIAGNOSIS — M25552 Pain in left hip: Secondary | ICD-10-CM | POA: Diagnosis not present

## 2015-03-19 DIAGNOSIS — M2041 Other hammer toe(s) (acquired), right foot: Secondary | ICD-10-CM | POA: Diagnosis not present

## 2015-03-19 DIAGNOSIS — L989 Disorder of the skin and subcutaneous tissue, unspecified: Secondary | ICD-10-CM | POA: Diagnosis not present

## 2015-03-19 DIAGNOSIS — M79606 Pain in leg, unspecified: Secondary | ICD-10-CM | POA: Diagnosis not present

## 2015-03-19 DIAGNOSIS — M19019 Primary osteoarthritis, unspecified shoulder: Secondary | ICD-10-CM | POA: Diagnosis not present

## 2015-03-23 ENCOUNTER — Other Ambulatory Visit: Payer: Self-pay | Admitting: Internal Medicine

## 2015-03-24 ENCOUNTER — Other Ambulatory Visit: Payer: Self-pay | Admitting: Internal Medicine

## 2015-03-24 ENCOUNTER — Telehealth: Payer: Self-pay | Admitting: *Deleted

## 2015-03-24 ENCOUNTER — Telehealth: Payer: Self-pay | Admitting: Internal Medicine

## 2015-03-24 MED ORDER — ENALAPRIL MALEATE 20 MG PO TABS: 20.0000 mg | ORAL_TABLET | Freq: Two times a day (BID) | ORAL | Status: DC

## 2015-03-24 NOTE — Telephone Encounter (Signed)
Completed.

## 2015-03-24 NOTE — Telephone Encounter (Signed)
I have completed her request.

## 2015-03-24 NOTE — Telephone Encounter (Signed)
Caller name: Trona Relationship to patient: pharmacy Can be reached: [phone]  Reason for call: Requesting refill rx for Enalapril 20 mg

## 2015-03-24 NOTE — Telephone Encounter (Signed)
Patient has requested a medication refill Rx for enalapril -thanks

## 2015-03-27 DIAGNOSIS — M25552 Pain in left hip: Secondary | ICD-10-CM | POA: Diagnosis not present

## 2015-03-27 DIAGNOSIS — M79606 Pain in leg, unspecified: Secondary | ICD-10-CM | POA: Diagnosis not present

## 2015-03-27 DIAGNOSIS — L989 Disorder of the skin and subcutaneous tissue, unspecified: Secondary | ICD-10-CM | POA: Diagnosis not present

## 2015-03-27 DIAGNOSIS — M2041 Other hammer toe(s) (acquired), right foot: Secondary | ICD-10-CM | POA: Diagnosis not present

## 2015-03-27 DIAGNOSIS — I509 Heart failure, unspecified: Secondary | ICD-10-CM | POA: Diagnosis not present

## 2015-03-27 DIAGNOSIS — M19019 Primary osteoarthritis, unspecified shoulder: Secondary | ICD-10-CM | POA: Diagnosis not present

## 2015-03-30 ENCOUNTER — Other Ambulatory Visit: Payer: Self-pay | Admitting: Internal Medicine

## 2015-03-30 DIAGNOSIS — Z1231 Encounter for screening mammogram for malignant neoplasm of breast: Secondary | ICD-10-CM

## 2015-03-31 ENCOUNTER — Ambulatory Visit
Admission: RE | Admit: 2015-03-31 | Discharge: 2015-03-31 | Disposition: A | Discharge status: Home / Self Care | Payer: Medicare Other | Source: Ambulatory Visit | Attending: Internal Medicine | Admitting: Internal Medicine

## 2015-03-31 ENCOUNTER — Other Ambulatory Visit: Payer: Self-pay | Admitting: Internal Medicine

## 2015-03-31 DIAGNOSIS — M25552 Pain in left hip: Secondary | ICD-10-CM | POA: Diagnosis not present

## 2015-03-31 DIAGNOSIS — M19019 Primary osteoarthritis, unspecified shoulder: Secondary | ICD-10-CM | POA: Diagnosis not present

## 2015-03-31 DIAGNOSIS — R32 Unspecified urinary incontinence: Secondary | ICD-10-CM | POA: Diagnosis not present

## 2015-03-31 DIAGNOSIS — Z1231 Encounter for screening mammogram for malignant neoplasm of breast: Secondary | ICD-10-CM

## 2015-03-31 DIAGNOSIS — R921 Mammographic calcification found on diagnostic imaging of breast: Secondary | ICD-10-CM | POA: Diagnosis not present

## 2015-03-31 DIAGNOSIS — M79606 Pain in leg, unspecified: Secondary | ICD-10-CM | POA: Diagnosis not present

## 2015-03-31 DIAGNOSIS — Z9981 Dependence on supplemental oxygen: Secondary | ICD-10-CM | POA: Diagnosis not present

## 2015-03-31 DIAGNOSIS — I499 Cardiac arrhythmia, unspecified: Secondary | ICD-10-CM | POA: Diagnosis not present

## 2015-03-31 DIAGNOSIS — I509 Heart failure, unspecified: Secondary | ICD-10-CM | POA: Diagnosis not present

## 2015-03-31 DIAGNOSIS — Z87440 Personal history of urinary (tract) infections: Secondary | ICD-10-CM | POA: Diagnosis not present

## 2015-03-31 DIAGNOSIS — M2041 Other hammer toe(s) (acquired), right foot: Secondary | ICD-10-CM | POA: Diagnosis not present

## 2015-03-31 DIAGNOSIS — L989 Disorder of the skin and subcutaneous tissue, unspecified: Secondary | ICD-10-CM | POA: Diagnosis not present

## 2015-04-01 ENCOUNTER — Telehealth: Payer: Self-pay | Admitting: *Deleted

## 2015-04-01 ENCOUNTER — Other Ambulatory Visit: Payer: Self-pay | Admitting: Internal Medicine

## 2015-04-01 DIAGNOSIS — R32 Unspecified urinary incontinence: Secondary | ICD-10-CM | POA: Diagnosis not present

## 2015-04-01 DIAGNOSIS — L989 Disorder of the skin and subcutaneous tissue, unspecified: Secondary | ICD-10-CM | POA: Diagnosis not present

## 2015-04-01 DIAGNOSIS — M79606 Pain in leg, unspecified: Secondary | ICD-10-CM | POA: Diagnosis not present

## 2015-04-01 DIAGNOSIS — I509 Heart failure, unspecified: Secondary | ICD-10-CM | POA: Diagnosis not present

## 2015-04-01 DIAGNOSIS — R921 Mammographic calcification found on diagnostic imaging of breast: Secondary | ICD-10-CM

## 2015-04-01 DIAGNOSIS — M25552 Pain in left hip: Secondary | ICD-10-CM | POA: Diagnosis not present

## 2015-04-01 DIAGNOSIS — I499 Cardiac arrhythmia, unspecified: Secondary | ICD-10-CM | POA: Diagnosis not present

## 2015-04-01 NOTE — Telephone Encounter (Signed)
[facility], wanted to [facility] find crackles for right lung base.

## 2015-04-01 NOTE — Telephone Encounter (Signed)
Please advise 

## 2015-04-01 NOTE — Telephone Encounter (Signed)
Scheduled patient with NP tomorrow at 1130.

## 2015-04-01 NOTE — Telephone Encounter (Signed)
We can see her in an office visit to evaluate. 3min. First available provider.

## 2015-04-02 ENCOUNTER — Encounter: Payer: Self-pay | Admitting: Nurse Practitioner

## 2015-04-02 ENCOUNTER — Ambulatory Visit (INDEPENDENT_AMBULATORY_CARE_PROVIDER_SITE_OTHER): Payer: Medicare Other | Admitting: Nurse Practitioner

## 2015-04-02 VITALS — BP 124/50 | HR 67 | Temp 97.9°F | Wt 191.0 lb

## 2015-04-02 DIAGNOSIS — R059 Cough, unspecified: Secondary | ICD-10-CM

## 2015-04-02 DIAGNOSIS — R05 Cough: Secondary | ICD-10-CM | POA: Diagnosis not present

## 2015-04-02 NOTE — Progress Notes (Signed)
Patient ID: Cindy Graham, female    DOB: [birth year]  Age: 81 y.o. MRN: [id number]  CC: Lung crackles and Leg Swelling   HPI JAVONA BENSTON presents for CC of crackles heard by [facility] RN.   1) She is accompanied by her son today. Her home health nurse heard fine crackles in the right lower lobe and wanted her examined.   History Pranshi has a past medical history of Hypertension; Hyperlipidemia; Hernia; Urine incontinence; COPD (chronic obstructive pulmonary disease) ([place]); Arthritis; GERD (gastroesophageal reflux disease); Heart murmur; Syncope and collapse; and Melanoma ([place]).   She has past surgical history that includes Abdominal hysterectomy; Cholecystectomy; and Hernia repair.   Her family history includes Cancer in her brother and sister; Heart disease in her brother and sister; Stroke in her father.She reports that she has never smoked. She has never used smokeless tobacco. She reports that she does not drink alcohol or use illicit drugs.  Outpatient Prescriptions Prior to Visit  Medication Sig Dispense Refill  . aspirin 81 MG tablet Take 81 mg by mouth daily.      Marland Kitchen atenolol (TENORMIN) 25 MG tablet TAKE ONE (1) TABLET BY MOUTH EVERY DAY 90 tablet 2  . Biotin 1000 MCG tablet Take 1,000 mcg by mouth daily.      . cholecalciferol (VITAMIN D) 400 UNITS TABS Take 400 Units by mouth 2 (two) times daily.      . enalapril (VASOTEC) 20 MG tablet Take 1 tablet (20 mg total) by mouth 2 (two) times daily. 60 tablet 5  . Flaxseed, Linseed, (FLAX SEED OIL) 1000 MG CAPS Take 1 capsule by mouth daily.      Marland Kitchen gabapentin (NEURONTIN) 300 MG capsule TAKE ONE (1) CAPSULE EACH DAY 90 capsule 2  . Multiple Vitamin (MULTIVITAMIN) capsule Take 1 capsule by mouth daily.      Marland Kitchen NITROSTAT 0.3 MG SL tablet DISSOLVE 1 TABLET UNDER TONGUE EVERY 5 MINUTES FOR 3 DOSES AS NEEDED FOR CHEST PAIN. IF NO RELIEF GO TO ER. 100 tablet 1  . NUCYNTA 50 MG TABS tablet Take 50 mg by mouth 2 (two) times daily as needed (Take 1  tablet twice a day as needed).     . nystatin (MYCOSTATIN/NYSTOP) 100000 UNIT/GM POWD Apply 1 application topically BID to affected area until rash resolves 30 g 1  . nystatin cream (MYCOSTATIN) Apply 1 application topically 2 (two) times daily. 30 g 0  . omeprazole (PRILOSEC) 20 MG capsule TAKE ONE (1) CAPSULE EACH DAY 90 capsule 2  . oxybutynin (DITROPAN) 5 MG tablet TAKE ONE TABLET EVERY DAY 30 tablet 6  . sertraline (ZOLOFT) 100 MG tablet TAKE ONE (1) TABLET EACH DAY 90 tablet 2  . triamterene-hydrochlorothiazide (DYAZIDE) 37.5-25 MG per capsule TAKE ONE CAPSULE BY MOUTH DAILY 90 capsule 2  . verapamil (CALAN-SR) 180 MG CR tablet TAKE ONE TABLET DAILY AT BEDTIME 90 tablet 1   No facility-administered medications prior to visit.    ROS Review of Systems  Constitutional: Negative for fever, chills, diaphoresis and fatigue.  Respiratory: Negative for cough, chest tightness, shortness of breath and wheezing.   Neurological: Negative for dizziness, weakness, numbness and headaches.    Objective:  BP 124/50 mmHg  Pulse 67  Temp(Src) 97.9 F (36.6 C) (Oral)  Wt 191 lb (86.637 kg)  SpO2 96%  Physical Exam  Constitutional: She is oriented to person, place, and time. She appears well-developed and well-nourished. No distress.  HENT:  Head: Normocephalic and atraumatic.  Right Ear:  External ear normal.  Left Ear: External ear normal.  Cardiovascular: Normal rate and regular rhythm.   Pulmonary/Chest: Effort normal and breath sounds normal. No respiratory distress. She has no wheezes. She has no rales. She exhibits no tenderness.  Neurological: She is alert and oriented to person, place, and time.  Skin: Skin is warm and dry. She is not diaphoretic.  Psychiatric: She has a normal mood and affect. Her behavior is normal. Judgment and thought content normal.   Assessment & Plan:   There are no diagnoses linked to this encounter. I am having Ms. Marinez maintain her aspirin,  multivitamin, cholecalciferol, Biotin, Flax Seed Oil, NUCYNTA, NITROSTAT, nystatin cream, nystatin, omeprazole, sertraline, atenolol, triamterene-hydrochlorothiazide, gabapentin, verapamil, oxybutynin, and enalapril.  No orders of the defined types were placed in this encounter.     Follow-up: No Follow-up on file.

## 2015-04-06 DIAGNOSIS — R32 Unspecified urinary incontinence: Secondary | ICD-10-CM | POA: Diagnosis not present

## 2015-04-06 DIAGNOSIS — I499 Cardiac arrhythmia, unspecified: Secondary | ICD-10-CM | POA: Diagnosis not present

## 2015-04-06 DIAGNOSIS — L989 Disorder of the skin and subcutaneous tissue, unspecified: Secondary | ICD-10-CM | POA: Diagnosis not present

## 2015-04-06 DIAGNOSIS — M79606 Pain in leg, unspecified: Secondary | ICD-10-CM | POA: Diagnosis not present

## 2015-04-06 DIAGNOSIS — M25552 Pain in left hip: Secondary | ICD-10-CM | POA: Diagnosis not present

## 2015-04-06 DIAGNOSIS — I509 Heart failure, unspecified: Secondary | ICD-10-CM | POA: Diagnosis not present

## 2015-04-08 NOTE — Assessment & Plan Note (Signed)
No adventitious lung sounds heard on exam today. Equal sounds in all lung fields. No further work up or treatment at this time.

## 2015-04-10 ENCOUNTER — Other Ambulatory Visit: Payer: Self-pay | Admitting: Internal Medicine

## 2015-04-10 ENCOUNTER — Ambulatory Visit
Admission: RE | Admit: 2015-04-10 | Discharge: 2015-04-10 | Disposition: A | Discharge status: Home / Self Care | Payer: Medicare Other | Source: Ambulatory Visit | Attending: Internal Medicine | Admitting: Internal Medicine

## 2015-04-10 DIAGNOSIS — R921 Mammographic calcification found on diagnostic imaging of breast: Secondary | ICD-10-CM

## 2015-04-13 ENCOUNTER — Telehealth: Payer: Self-pay | Admitting: Internal Medicine

## 2015-04-13 ENCOUNTER — Other Ambulatory Visit: Payer: Self-pay | Admitting: Internal Medicine

## 2015-04-13 DIAGNOSIS — R928 Other abnormal and inconclusive findings on diagnostic imaging of breast: Secondary | ICD-10-CM

## 2015-04-13 NOTE — Telephone Encounter (Signed)
Faxed letter to vanessa at [facility]

## 2015-04-13 NOTE — Telephone Encounter (Signed)
Yes, fine to stop aspirin

## 2015-04-13 NOTE — Telephone Encounter (Signed)
Cindy Graham [id number] called from [facility] regarding pt if she can come off her aspirin for the breast biopsy? If so she needs a written order fax to the number [phone]. Thank You

## 2015-04-13 NOTE — Telephone Encounter (Signed)
Please advise 

## 2015-04-14 DIAGNOSIS — I509 Heart failure, unspecified: Secondary | ICD-10-CM | POA: Diagnosis not present

## 2015-04-14 DIAGNOSIS — M79606 Pain in leg, unspecified: Secondary | ICD-10-CM | POA: Diagnosis not present

## 2015-04-14 DIAGNOSIS — M25552 Pain in left hip: Secondary | ICD-10-CM | POA: Diagnosis not present

## 2015-04-14 DIAGNOSIS — L989 Disorder of the skin and subcutaneous tissue, unspecified: Secondary | ICD-10-CM | POA: Diagnosis not present

## 2015-04-14 DIAGNOSIS — R32 Unspecified urinary incontinence: Secondary | ICD-10-CM | POA: Diagnosis not present

## 2015-04-14 DIAGNOSIS — I499 Cardiac arrhythmia, unspecified: Secondary | ICD-10-CM | POA: Diagnosis not present

## 2015-04-21 ENCOUNTER — Ambulatory Visit
Admission: RE | Admit: 2015-04-21 | Discharge: 2015-04-21 | Disposition: A | Discharge status: Home / Self Care | Payer: Medicare Other | Source: Ambulatory Visit | Attending: Internal Medicine | Admitting: Internal Medicine

## 2015-04-21 DIAGNOSIS — R928 Other abnormal and inconclusive findings on diagnostic imaging of breast: Secondary | ICD-10-CM

## 2015-04-21 DIAGNOSIS — R921 Mammographic calcification found on diagnostic imaging of breast: Secondary | ICD-10-CM | POA: Diagnosis not present

## 2015-04-21 DIAGNOSIS — D241 Benign neoplasm of right breast: Secondary | ICD-10-CM | POA: Diagnosis not present

## 2015-04-22 DIAGNOSIS — R32 Unspecified urinary incontinence: Secondary | ICD-10-CM | POA: Diagnosis not present

## 2015-04-22 DIAGNOSIS — M79606 Pain in leg, unspecified: Secondary | ICD-10-CM | POA: Diagnosis not present

## 2015-04-22 DIAGNOSIS — M25552 Pain in left hip: Secondary | ICD-10-CM | POA: Diagnosis not present

## 2015-04-22 DIAGNOSIS — I509 Heart failure, unspecified: Secondary | ICD-10-CM | POA: Diagnosis not present

## 2015-04-22 DIAGNOSIS — L989 Disorder of the skin and subcutaneous tissue, unspecified: Secondary | ICD-10-CM | POA: Diagnosis not present

## 2015-04-22 DIAGNOSIS — I499 Cardiac arrhythmia, unspecified: Secondary | ICD-10-CM | POA: Diagnosis not present

## 2015-04-24 DIAGNOSIS — Z23 Encounter for immunization: Secondary | ICD-10-CM | POA: Diagnosis not present

## 2015-04-28 DIAGNOSIS — M25552 Pain in left hip: Secondary | ICD-10-CM | POA: Diagnosis not present

## 2015-04-28 DIAGNOSIS — I509 Heart failure, unspecified: Secondary | ICD-10-CM | POA: Diagnosis not present

## 2015-04-28 DIAGNOSIS — I499 Cardiac arrhythmia, unspecified: Secondary | ICD-10-CM | POA: Diagnosis not present

## 2015-04-28 DIAGNOSIS — L989 Disorder of the skin and subcutaneous tissue, unspecified: Secondary | ICD-10-CM | POA: Diagnosis not present

## 2015-04-28 DIAGNOSIS — M79606 Pain in leg, unspecified: Secondary | ICD-10-CM | POA: Diagnosis not present

## 2015-04-28 DIAGNOSIS — R32 Unspecified urinary incontinence: Secondary | ICD-10-CM | POA: Diagnosis not present

## 2015-05-05 ENCOUNTER — Telehealth: Payer: Self-pay

## 2015-05-05 DIAGNOSIS — M79606 Pain in leg, unspecified: Secondary | ICD-10-CM | POA: Diagnosis not present

## 2015-05-05 DIAGNOSIS — I499 Cardiac arrhythmia, unspecified: Secondary | ICD-10-CM | POA: Diagnosis not present

## 2015-05-05 DIAGNOSIS — I509 Heart failure, unspecified: Secondary | ICD-10-CM | POA: Diagnosis not present

## 2015-05-05 DIAGNOSIS — L989 Disorder of the skin and subcutaneous tissue, unspecified: Secondary | ICD-10-CM | POA: Diagnosis not present

## 2015-05-05 DIAGNOSIS — M25552 Pain in left hip: Secondary | ICD-10-CM | POA: Diagnosis not present

## 2015-05-05 DIAGNOSIS — R32 Unspecified urinary incontinence: Secondary | ICD-10-CM | POA: Diagnosis not present

## 2015-05-05 NOTE — Telephone Encounter (Signed)
Spoke with [name], she will have the patient uses OTC Cream and weill let us know if it isn't helping.

## 2015-05-05 NOTE — Telephone Encounter (Signed)
Cortizone cream is OTC. Fine with me

## 2015-05-05 NOTE — Telephone Encounter (Signed)
See above cortisone cream fine

## 2015-05-05 NOTE — Telephone Encounter (Signed)
Please advise 

## 2015-05-05 NOTE — Telephone Encounter (Signed)
Cindy Graham called, her number is [phone]. The top of Pt's right foot is errythemic and very dry, not painful. Pt states that it started when she started wearing different types of shoes. Looks almost like an allergic reaction. Would Dr. Gilford Rile consider ordering some type of tropical Cortizone cream for pt?

## 2015-05-12 DIAGNOSIS — I509 Heart failure, unspecified: Secondary | ICD-10-CM | POA: Diagnosis not present

## 2015-05-12 DIAGNOSIS — I499 Cardiac arrhythmia, unspecified: Secondary | ICD-10-CM | POA: Diagnosis not present

## 2015-05-12 DIAGNOSIS — L989 Disorder of the skin and subcutaneous tissue, unspecified: Secondary | ICD-10-CM | POA: Diagnosis not present

## 2015-05-12 DIAGNOSIS — M25552 Pain in left hip: Secondary | ICD-10-CM | POA: Diagnosis not present

## 2015-05-12 DIAGNOSIS — R32 Unspecified urinary incontinence: Secondary | ICD-10-CM | POA: Diagnosis not present

## 2015-05-12 DIAGNOSIS — M79606 Pain in leg, unspecified: Secondary | ICD-10-CM | POA: Diagnosis not present

## 2015-05-19 DIAGNOSIS — R32 Unspecified urinary incontinence: Secondary | ICD-10-CM | POA: Diagnosis not present

## 2015-05-19 DIAGNOSIS — M25552 Pain in left hip: Secondary | ICD-10-CM | POA: Diagnosis not present

## 2015-05-19 DIAGNOSIS — I499 Cardiac arrhythmia, unspecified: Secondary | ICD-10-CM | POA: Diagnosis not present

## 2015-05-19 DIAGNOSIS — L989 Disorder of the skin and subcutaneous tissue, unspecified: Secondary | ICD-10-CM | POA: Diagnosis not present

## 2015-05-19 DIAGNOSIS — I509 Heart failure, unspecified: Secondary | ICD-10-CM | POA: Diagnosis not present

## 2015-05-19 DIAGNOSIS — M79606 Pain in leg, unspecified: Secondary | ICD-10-CM | POA: Diagnosis not present

## 2015-05-20 ENCOUNTER — Encounter: Payer: Self-pay | Admitting: Nurse Practitioner

## 2015-05-20 ENCOUNTER — Ambulatory Visit (INDEPENDENT_AMBULATORY_CARE_PROVIDER_SITE_OTHER): Payer: Medicare Other | Admitting: Nurse Practitioner

## 2015-05-20 VITALS — BP 128/60 | HR 69 | Temp 99.9°F | Resp 12 | Ht 64.0 in | Wt 186.0 lb

## 2015-05-20 DIAGNOSIS — I739 Peripheral vascular disease, unspecified: Secondary | ICD-10-CM

## 2015-05-20 NOTE — Progress Notes (Signed)
Patient ID: Cindy Graham, female    DOB: [birth year]  Age: 81 y.o. MRN: [id number]  CC: Acute Visit   HPI [name] presents for CC of left foot discoloration x 3 weeks.  1) Top of left foot was purple she reports. She spilled water on her purple flipper and thought the color had bled onto her skin. The [facility] RN said she wiped it with an alcohol wipe to see if the color could be removed (per daughter). Ms. Cindy Graham sees podiatry and has had known PVD. She reports the "bruising" has improved on the top of the left foot. Reports very mild tenderness. Denies injury  History Cindy Graham has a past medical history of Hypertension; Hyperlipidemia; Hernia; Urine incontinence; COPD (chronic obstructive pulmonary disease) (Cindy Graham); Arthritis; GERD (gastroesophageal reflux disease); Heart murmur; Syncope and collapse; and Melanoma (Cindy Graham).   She has past surgical history that includes Abdominal hysterectomy; Cholecystectomy; and Hernia repair.   Her family history includes Cancer in her brother and sister; Heart disease in her brother and sister; Stroke in her father.She reports that she has never smoked. She has never used smokeless tobacco. She reports that she does not drink alcohol or use illicit drugs.  Outpatient Prescriptions Prior to Visit  Medication Sig Dispense Refill  . aspirin 81 MG tablet Take 81 mg by mouth daily.      Marland Kitchen atenolol (TENORMIN) 25 MG tablet TAKE ONE (1) TABLET BY MOUTH EVERY DAY 90 tablet 2  . Biotin 1000 MCG tablet Take 1,000 mcg by mouth daily.      . cholecalciferol (VITAMIN D) 400 UNITS TABS Take 400 Units by mouth 2 (two) times daily.      . enalapril (VASOTEC) 20 MG tablet Take 1 tablet (20 mg total) by mouth 2 (two) times daily. 60 tablet 5  . Flaxseed, Linseed, (FLAX SEED OIL) 1000 MG CAPS Take 1 capsule by mouth daily.      . Multiple Vitamin (MULTIVITAMIN) capsule Take 1 capsule by mouth daily.      Marland Kitchen NITROSTAT 0.3 MG SL tablet DISSOLVE 1 TABLET UNDER TONGUE EVERY 5 MINUTES  FOR 3 DOSES AS NEEDED FOR CHEST PAIN. IF NO RELIEF GO TO ER. 100 tablet 1  . NUCYNTA 50 MG TABS tablet Take 50 mg by mouth 2 (two) times daily as needed (Take 1 tablet twice a day as needed).     . nystatin (MYCOSTATIN/NYSTOP) 100000 UNIT/GM POWD Apply 1 application topically BID to affected area until rash resolves 30 g 1  . nystatin cream (MYCOSTATIN) Apply 1 application topically 2 (two) times daily. 30 g 0  . oxybutynin (DITROPAN) 5 MG tablet TAKE ONE TABLET EVERY DAY 30 tablet 6  . triamterene-hydrochlorothiazide (DYAZIDE) 37.5-25 MG per capsule TAKE ONE CAPSULE BY MOUTH DAILY 90 capsule 2  . verapamil (CALAN-SR) 180 MG CR tablet TAKE ONE TABLET DAILY AT BEDTIME 90 tablet 1  . gabapentin (NEURONTIN) 300 MG capsule TAKE ONE (1) CAPSULE EACH DAY 90 capsule 2  . omeprazole (PRILOSEC) 20 MG capsule TAKE ONE (1) CAPSULE EACH DAY 90 capsule 2  . sertraline (ZOLOFT) 100 MG tablet TAKE ONE (1) TABLET EACH DAY 90 tablet 2   No facility-administered medications prior to visit.   ROS Review of Systems  Constitutional: Negative for fever, chills, diaphoresis and fatigue.  Respiratory: Negative for chest tightness, shortness of breath and wheezing.   Cardiovascular: Negative for chest pain, palpitations and leg swelling.  Gastrointestinal: Negative for nausea, vomiting and diarrhea.  Musculoskeletal: Positive for  myalgias.       Tender top of left foot- mild  Skin: Positive for color change. Negative for pallor, rash and wound.       Ecchymosis top of left foot  Neurological: Negative for dizziness, numbness and headaches.  Psychiatric/Behavioral: The patient is not nervous/anxious.    Objective:  BP 128/60 mmHg  Pulse 69  Temp(Src) 99.9 F (37.7 C) (Oral)  Resp 12  Ht 5\' 4"  (1.626 m)  Wt 186 lb (84.369 kg)  BMI 31.91 kg/m2  SpO2 92%  Physical Exam  Constitutional: She is oriented to person, place, and time. She appears well-developed and well-nourished. No distress.  HENT:  Head:  Normocephalic and atraumatic.  Right Ear: External ear normal.  Left Ear: External ear normal.  Pulmonary/Chest: She exhibits no tenderness.  Musculoskeletal: She exhibits tenderness. She exhibits no edema.       Feet:  Apparent areas of discoloration. Looks to be worsening PVD, brisk capillary refill for age, no hair on LE, varicosed veins, Full ROM of toes  Neurological: She is alert and oriented to person, place, and time. Coordination abnormal.  Uses rolling walker  Skin: Skin is warm and dry. She is not diaphoretic.  Psychiatric: She has a normal mood and affect. Her behavior is normal. Judgment and thought content normal.      Assessment & Plan:   Anaiza was seen today for acute visit.  Diagnoses and all orders for this visit:  PVD (peripheral vascular disease) ([place])  I am having Ms. Cindy Graham maintain her aspirin, multivitamin, cholecalciferol, Biotin, Flax Seed Oil, NUCYNTA, NITROSTAT, nystatin cream, nystatin, atenolol, triamterene-hydrochlorothiazide, verapamil, oxybutynin, and enalapril.  No orders of the defined types were placed in this encounter.     Follow-up: Return if symptoms worsen or fail to improve.

## 2015-05-20 NOTE — Progress Notes (Signed)
Pre-visit discussion using our clinic review tool. No additional management support is needed unless otherwise documented below in the visit note.  

## 2015-05-21 ENCOUNTER — Telehealth: Payer: Self-pay | Admitting: *Deleted

## 2015-05-21 DIAGNOSIS — I739 Peripheral vascular disease, unspecified: Secondary | ICD-10-CM

## 2015-05-21 NOTE — Telephone Encounter (Signed)
Appointment made 05/28/15 at 2 pm with Dr Vickki Muff.  Referral placed.  Appointment with Dr Gilford Rile cancelled.  Patient is aware.

## 2015-05-21 NOTE — Telephone Encounter (Signed)
-----   Message from Rubbie Battiest, NP sent at 05/21/2015  8:01 AM EST ----- [name] needs an appointment for worsening of Peripheral vascular disease of her left foot. She sees Dr. Vickki Muff with Podiatry if you can call the office and set up an appointment any time in the next 3 weeks please.   Please let pt know that she does not need to follow up with Dr. Gilford Rile on Jan. 16th and please cancel this visit on the schedule. Please give her the appointment date and time for Dr. Vickki Muff when you call.   Thank you, Cindy Graham

## 2015-05-25 ENCOUNTER — Other Ambulatory Visit: Payer: Self-pay | Admitting: Internal Medicine

## 2015-05-26 ENCOUNTER — Telehealth: Payer: Self-pay | Admitting: Internal Medicine

## 2015-05-26 DIAGNOSIS — M25552 Pain in left hip: Secondary | ICD-10-CM | POA: Diagnosis not present

## 2015-05-26 DIAGNOSIS — L989 Disorder of the skin and subcutaneous tissue, unspecified: Secondary | ICD-10-CM | POA: Diagnosis not present

## 2015-05-26 DIAGNOSIS — R32 Unspecified urinary incontinence: Secondary | ICD-10-CM | POA: Diagnosis not present

## 2015-05-26 DIAGNOSIS — I499 Cardiac arrhythmia, unspecified: Secondary | ICD-10-CM | POA: Diagnosis not present

## 2015-05-26 DIAGNOSIS — M79606 Pain in leg, unspecified: Secondary | ICD-10-CM | POA: Diagnosis not present

## 2015-05-26 DIAGNOSIS — I509 Heart failure, unspecified: Secondary | ICD-10-CM | POA: Diagnosis not present

## 2015-05-26 NOTE — Telephone Encounter (Signed)
Linda Click from [facility] called. She needs a variable ok to from Dr. Gilford Rile to continue home health nursring services for pt. Also would like to know about pt's left foot, was it PVD. Please call [name] at [phone].

## 2015-05-26 NOTE — Telephone Encounter (Signed)
Verbal order for Home healt, and yes it was PVD, referral to Dr. Vickki Muff.

## 2015-05-28 DIAGNOSIS — I739 Peripheral vascular disease, unspecified: Secondary | ICD-10-CM | POA: Insufficient documentation

## 2015-05-28 DIAGNOSIS — M7989 Other specified soft tissue disorders: Secondary | ICD-10-CM | POA: Diagnosis not present

## 2015-05-28 NOTE — Assessment & Plan Note (Signed)
Followed by Dr. Vickki Muff, Podiatry Pt reports she is unable to get compression hose on and off Stable at this time, but Dr. Vickki Muff followed Right foot at last visit. Want him to check left foot soon- will ask CMA to make her an appointment Spoke with Georgiann Cocker, RN about findings and what our plan is. She was agreeable to this. I will ask Dr. Gilford Rile (at request of pt) if she wants to keep an appointment on 06/08/15.  FU prn worsening/failure to improve.

## 2015-05-30 DIAGNOSIS — I739 Peripheral vascular disease, unspecified: Secondary | ICD-10-CM | POA: Diagnosis not present

## 2015-05-30 DIAGNOSIS — M2041 Other hammer toe(s) (acquired), right foot: Secondary | ICD-10-CM | POA: Diagnosis not present

## 2015-05-30 DIAGNOSIS — M79606 Pain in leg, unspecified: Secondary | ICD-10-CM | POA: Diagnosis not present

## 2015-05-30 DIAGNOSIS — Z9981 Dependence on supplemental oxygen: Secondary | ICD-10-CM | POA: Diagnosis not present

## 2015-05-30 DIAGNOSIS — L989 Disorder of the skin and subcutaneous tissue, unspecified: Secondary | ICD-10-CM | POA: Diagnosis not present

## 2015-05-30 DIAGNOSIS — M25552 Pain in left hip: Secondary | ICD-10-CM | POA: Diagnosis not present

## 2015-05-30 DIAGNOSIS — Z87440 Personal history of urinary (tract) infections: Secondary | ICD-10-CM | POA: Diagnosis not present

## 2015-05-30 DIAGNOSIS — R32 Unspecified urinary incontinence: Secondary | ICD-10-CM | POA: Diagnosis not present

## 2015-05-30 DIAGNOSIS — M19019 Primary osteoarthritis, unspecified shoulder: Secondary | ICD-10-CM | POA: Diagnosis not present

## 2015-05-30 DIAGNOSIS — I509 Heart failure, unspecified: Secondary | ICD-10-CM | POA: Diagnosis not present

## 2015-06-01 ENCOUNTER — Telehealth: Payer: Self-pay | Admitting: Internal Medicine

## 2015-06-01 DIAGNOSIS — L989 Disorder of the skin and subcutaneous tissue, unspecified: Secondary | ICD-10-CM | POA: Diagnosis not present

## 2015-06-01 DIAGNOSIS — M79606 Pain in leg, unspecified: Secondary | ICD-10-CM | POA: Diagnosis not present

## 2015-06-01 DIAGNOSIS — I739 Peripheral vascular disease, unspecified: Secondary | ICD-10-CM | POA: Diagnosis not present

## 2015-06-01 DIAGNOSIS — I509 Heart failure, unspecified: Secondary | ICD-10-CM | POA: Diagnosis not present

## 2015-06-01 DIAGNOSIS — R0602 Shortness of breath: Secondary | ICD-10-CM | POA: Diagnosis not present

## 2015-06-01 DIAGNOSIS — M25552 Pain in left hip: Secondary | ICD-10-CM | POA: Diagnosis not present

## 2015-06-01 DIAGNOSIS — R32 Unspecified urinary incontinence: Secondary | ICD-10-CM | POA: Diagnosis not present

## 2015-06-01 NOTE — Telephone Encounter (Signed)
Spoke with  [name] from [facility].  She was agreeable to the CXR.  She is attempting to keep the patient out of the hospital.  Advised if you would order a zpack or something as she is coughing up yellow sputum and has a low grade temp.  Please advise.

## 2015-06-01 NOTE — Telephone Encounter (Signed)
Called today having cough congestion low grade axillary temp . Coughing up yellow mucus oxygen sat on room air 87% with oxygen on 95%, she has mild weezing rock eye in upper lobe she is not at this point needing to go to the ER impossible to get out to see the Dr. Lilian Coma you go ahead and start treatment and get a mobile chest xray.  [facility] [id number].

## 2015-06-01 NOTE — Telephone Encounter (Signed)
Most cough and congestion would be viral. I would recommend that we see the results of the chest xray first. We have seen a 50% increase in CDiff this year. Trying to avoid unnecessary antibiotics.

## 2015-06-01 NOTE — Telephone Encounter (Signed)
Please advise 

## 2015-06-01 NOTE — Telephone Encounter (Signed)
It would be best for her to be evaluated either in the ED or urgent care. However if unable to get out, fine to do mobile CXR.

## 2015-06-01 NOTE — Telephone Encounter (Signed)
Called today having cough congestion low grade axillary temp . Coughing up yellow mucus oxygen sat on room air 87% with oxygen on 95%, she has mild weezing rock eye in upper lobe she is not at this point needing to go to the ER impossible to get out to see the Dr. Lilian Coma you go ahead and start treatment and get a mobile chest xray.  [facility] [id number].

## 2015-06-01 NOTE — Telephone Encounter (Signed)
Spoke with linda and gave rationale for not completing the antibiotics first.

## 2015-06-03 ENCOUNTER — Telehealth: Payer: Self-pay | Admitting: Family Medicine

## 2015-06-03 ENCOUNTER — Encounter: Payer: Self-pay | Admitting: Family Medicine

## 2015-06-03 ENCOUNTER — Telehealth: Payer: Self-pay | Admitting: Internal Medicine

## 2015-06-03 ENCOUNTER — Ambulatory Visit (INDEPENDENT_AMBULATORY_CARE_PROVIDER_SITE_OTHER): Payer: Medicare Other | Admitting: Family Medicine

## 2015-06-03 VITALS — BP 112/60 | HR 71 | Temp 98.2°F | Ht 64.0 in | Wt 184.1 lb

## 2015-06-03 DIAGNOSIS — R32 Unspecified urinary incontinence: Secondary | ICD-10-CM | POA: Diagnosis not present

## 2015-06-03 DIAGNOSIS — M79606 Pain in leg, unspecified: Secondary | ICD-10-CM | POA: Diagnosis not present

## 2015-06-03 DIAGNOSIS — M25552 Pain in left hip: Secondary | ICD-10-CM | POA: Diagnosis not present

## 2015-06-03 DIAGNOSIS — L989 Disorder of the skin and subcutaneous tissue, unspecified: Secondary | ICD-10-CM | POA: Diagnosis not present

## 2015-06-03 DIAGNOSIS — R059 Cough, unspecified: Secondary | ICD-10-CM | POA: Insufficient documentation

## 2015-06-03 DIAGNOSIS — R05 Cough: Secondary | ICD-10-CM | POA: Diagnosis not present

## 2015-06-03 DIAGNOSIS — I509 Heart failure, unspecified: Secondary | ICD-10-CM | POA: Diagnosis not present

## 2015-06-03 DIAGNOSIS — I739 Peripheral vascular disease, unspecified: Secondary | ICD-10-CM | POA: Diagnosis not present

## 2015-06-03 MED ORDER — HYDROCOD POLST-CPM POLST ER 10-8 MG/5ML PO SUER: 5.0000 mL | Freq: Two times a day (BID) | ORAL | Status: DC | PRN

## 2015-06-03 MED ORDER — MOXIFLOXACIN HCL 400 MG PO TABS: 400.0000 mg | ORAL_TABLET | Freq: Every day | ORAL | Status: DC

## 2015-06-03 MED ORDER — LEVOFLOXACIN 250 MG PO TABS: ORAL_TABLET | ORAL | Status: DC

## 2015-06-03 NOTE — Telephone Encounter (Signed)
Pt called about her medication that was prescribed today it came up to 160.00. Pt cannot afford the medication is there is something else that can be prescribed? Pharmacy is [place], [place]. Call pt @ [phone]. Thank You!

## 2015-06-03 NOTE — Patient Instructions (Signed)
It was nice to see you today.  Take the antibiotic as prescribed.  Call if you fail to improve or worsen.  Take care  Dr. Lacinda Axon

## 2015-06-03 NOTE — Assessment & Plan Note (Signed)
New problem. Acute bronchitis vs PNA.  Given age and co-morbidities, treating with Levaquin. Tussionex (sparingly) for cough.

## 2015-06-03 NOTE — Telephone Encounter (Signed)
Portable chest xray was normal. Have symptoms resolved?

## 2015-06-03 NOTE — Telephone Encounter (Signed)
FYI

## 2015-06-03 NOTE — Progress Notes (Signed)
Pre visit review using our clinic review tool, if applicable. No additional management support is needed unless otherwise documented below in the visit note. 

## 2015-06-03 NOTE — Progress Notes (Signed)
Subjective:  Patient ID: Cindy Graham, female    DOB: [birth year]  Age: 82 y.o. MRN: [id number]  CC:  Cough  HPI:  82 year old female with past medical history of CKD stage III, hypertension, presents with complaints of cough.  Patient reports that she's had cough for the past week. She reports the cough is wet but she is unable to get sputum up. Cough is particularly severe at night and interferes with sleep. She reports associated low-grade temperature but denies fever. No chills. No increasing shortness of breath. No exacerbating or relieving factors. She states that she was seen by her home health RN and a chest x-ray was ordered by her primary physician. It was reportedly normal although I'm unable to see the film.  Social Hx   Social History   Social History  . Marital Status: Married    Spouse Name: N/A  . Number of Children: N/A  . Years of Education: N/A   Social History Main Topics  . Smoking status: Never Smoker   . Smokeless tobacco: Never Used  . Alcohol Use: No  . Drug Use: No  . Sexual Activity: Not Asked   Other Topics Concern  . None   Social History Narrative   Review of Systems  Constitutional: Negative for fever and chills.  HENT: Positive for congestion.   Respiratory: Positive for cough.    Objective:  BP 112/60 mmHg  Pulse 71  Temp(Src) 98.2 F (36.8 C) (Oral)  Ht 5\' 4"  (1.626 m)  Wt 184 lb 2 oz (83.519 kg)  BMI 31.59 kg/m2  SpO2 94%  BP/Weight 06/03/2015 05/20/2015 [id number]  Systolic BP [id number]  Diastolic BP 60 60 50  Wt. (Lbs) 184.13 186 191  BMI 31.59 31.91 32.77    Physical Exam  Constitutional: She appears well-developed. No distress.  HENT:  Head: Normocephalic and atraumatic.  Eyes: Conjunctivae are normal. Right eye exhibits no discharge. Left eye exhibits no discharge.  Cardiovascular: Normal rate and regular rhythm.   Pulmonary/Chest: Effort normal.  Coarse breath sounds and wheezing noted throughout.    Neurological: She is alert.  Psychiatric: She has a normal mood and affect.  Vitals reviewed.  Lab Results  Component Value Date   WBC 9.0 03/06/2015   HGB 12.8 03/06/2015   HCT 38.5 03/06/2015   PLT 340.0 03/06/2015   GLUCOSE 87 03/06/2015   CHOL 214* 07/16/2014   TRIG 207.0* 07/16/2014   HDL 48.80 07/16/2014   LDLDIRECT 135.0 07/16/2014   LDLCALC 150* 03/15/2013   ALT 12 03/06/2015   AST 22 03/06/2015   NA 136 03/06/2015   K 4.4 03/06/2015   CL 99 03/06/2015   CREATININE 1.19 03/06/2015   BUN 23 03/06/2015   CO2 27 03/06/2015   TSH 3.20 03/06/2015   HGBA1C 6.0 03/14/2013   MICROALBUR 0.7 01/15/2014   Assessment & Plan:   Problem List Items Addressed This Visit    Cough - Primary    New problem. Acute bronchitis vs PNA.  Given age and co-morbidities, treating with Levaquin. Tussionex (sparingly) for cough.         Meds ordered this encounter  Medications  . DISCONTD: moxifloxacin (AVELOX) 400 MG tablet    Sig: Take 1 tablet (400 mg total) by mouth daily.    Dispense:  7 tablet    Refill:  0  . chlorpheniramine-HYDROcodone (TUSSIONEX PENNKINETIC ER) 10-8 MG/5ML SUER    Sig: Take 5 mLs by mouth every 12 (twelve) hours as needed.  Dispense:  115 mL    Refill:  0  . levofloxacin (LEVAQUIN) 250 MG tablet    Sig: 2 tablets on Day 1, then 1 tablet daily for 6 days.    Dispense:  8 tablet    Refill:  0    Follow-up: PRN  [place]

## 2015-06-03 NOTE — Telephone Encounter (Signed)
New Rx sent (Levaquin).

## 2015-06-04 ENCOUNTER — Telehealth: Payer: Self-pay | Admitting: Internal Medicine

## 2015-06-04 NOTE — Telephone Encounter (Signed)
Attempted for the third time to reach her.  Busy signal.

## 2015-06-04 NOTE — Telephone Encounter (Signed)
Fine to increase visit frequency

## 2015-06-04 NOTE — Telephone Encounter (Signed)
Pt was seen by Dr Lacinda Axon yesterday

## 2015-06-04 NOTE — Telephone Encounter (Signed)
Attempted to call Cindy Graham from [facility] line busy. Please advise if I can increase visit.

## 2015-06-04 NOTE — Telephone Encounter (Signed)
Attempted to call [name] back again.

## 2015-06-04 NOTE — Telephone Encounter (Signed)
Cindy Graham [facility] regarding need a verbal order to increase her visit to frequency to monitor her. Thank You!

## 2015-06-05 DIAGNOSIS — M79606 Pain in leg, unspecified: Secondary | ICD-10-CM | POA: Diagnosis not present

## 2015-06-05 DIAGNOSIS — M25552 Pain in left hip: Secondary | ICD-10-CM | POA: Diagnosis not present

## 2015-06-05 DIAGNOSIS — L989 Disorder of the skin and subcutaneous tissue, unspecified: Secondary | ICD-10-CM | POA: Diagnosis not present

## 2015-06-05 DIAGNOSIS — I739 Peripheral vascular disease, unspecified: Secondary | ICD-10-CM | POA: Diagnosis not present

## 2015-06-05 DIAGNOSIS — I509 Heart failure, unspecified: Secondary | ICD-10-CM | POA: Diagnosis not present

## 2015-06-05 DIAGNOSIS — R32 Unspecified urinary incontinence: Secondary | ICD-10-CM | POA: Diagnosis not present

## 2015-06-05 NOTE — Telephone Encounter (Signed)
Spoke with [name] today and gave verbal to continue.

## 2015-06-08 ENCOUNTER — Ambulatory Visit: Payer: Medicare Other | Admitting: Internal Medicine

## 2015-06-08 DIAGNOSIS — R32 Unspecified urinary incontinence: Secondary | ICD-10-CM | POA: Diagnosis not present

## 2015-06-08 DIAGNOSIS — I509 Heart failure, unspecified: Secondary | ICD-10-CM | POA: Diagnosis not present

## 2015-06-08 DIAGNOSIS — M79606 Pain in leg, unspecified: Secondary | ICD-10-CM | POA: Diagnosis not present

## 2015-06-08 DIAGNOSIS — I739 Peripheral vascular disease, unspecified: Secondary | ICD-10-CM | POA: Diagnosis not present

## 2015-06-08 DIAGNOSIS — L989 Disorder of the skin and subcutaneous tissue, unspecified: Secondary | ICD-10-CM | POA: Diagnosis not present

## 2015-06-08 DIAGNOSIS — M25552 Pain in left hip: Secondary | ICD-10-CM | POA: Diagnosis not present

## 2015-06-10 DIAGNOSIS — M79606 Pain in leg, unspecified: Secondary | ICD-10-CM | POA: Diagnosis not present

## 2015-06-10 DIAGNOSIS — I509 Heart failure, unspecified: Secondary | ICD-10-CM | POA: Diagnosis not present

## 2015-06-10 DIAGNOSIS — M25552 Pain in left hip: Secondary | ICD-10-CM | POA: Diagnosis not present

## 2015-06-10 DIAGNOSIS — I739 Peripheral vascular disease, unspecified: Secondary | ICD-10-CM | POA: Diagnosis not present

## 2015-06-12 DIAGNOSIS — M25552 Pain in left hip: Secondary | ICD-10-CM | POA: Diagnosis not present

## 2015-06-12 DIAGNOSIS — R32 Unspecified urinary incontinence: Secondary | ICD-10-CM | POA: Diagnosis not present

## 2015-06-12 DIAGNOSIS — M79606 Pain in leg, unspecified: Secondary | ICD-10-CM | POA: Diagnosis not present

## 2015-06-12 DIAGNOSIS — L989 Disorder of the skin and subcutaneous tissue, unspecified: Secondary | ICD-10-CM | POA: Diagnosis not present

## 2015-06-12 DIAGNOSIS — I509 Heart failure, unspecified: Secondary | ICD-10-CM | POA: Diagnosis not present

## 2015-06-12 DIAGNOSIS — I739 Peripheral vascular disease, unspecified: Secondary | ICD-10-CM | POA: Diagnosis not present

## 2015-06-17 DIAGNOSIS — M25552 Pain in left hip: Secondary | ICD-10-CM | POA: Diagnosis not present

## 2015-06-17 DIAGNOSIS — I509 Heart failure, unspecified: Secondary | ICD-10-CM | POA: Diagnosis not present

## 2015-06-17 DIAGNOSIS — I739 Peripheral vascular disease, unspecified: Secondary | ICD-10-CM | POA: Diagnosis not present

## 2015-06-17 DIAGNOSIS — M79606 Pain in leg, unspecified: Secondary | ICD-10-CM | POA: Diagnosis not present

## 2015-06-17 DIAGNOSIS — L989 Disorder of the skin and subcutaneous tissue, unspecified: Secondary | ICD-10-CM | POA: Diagnosis not present

## 2015-06-17 DIAGNOSIS — R32 Unspecified urinary incontinence: Secondary | ICD-10-CM | POA: Diagnosis not present

## 2015-06-22 ENCOUNTER — Telehealth: Payer: Self-pay | Admitting: Internal Medicine

## 2015-06-22 DIAGNOSIS — L989 Disorder of the skin and subcutaneous tissue, unspecified: Secondary | ICD-10-CM | POA: Diagnosis not present

## 2015-06-22 DIAGNOSIS — I509 Heart failure, unspecified: Secondary | ICD-10-CM | POA: Diagnosis not present

## 2015-06-22 DIAGNOSIS — M25552 Pain in left hip: Secondary | ICD-10-CM | POA: Diagnosis not present

## 2015-06-22 DIAGNOSIS — M79606 Pain in leg, unspecified: Secondary | ICD-10-CM | POA: Diagnosis not present

## 2015-06-22 DIAGNOSIS — R32 Unspecified urinary incontinence: Secondary | ICD-10-CM | POA: Diagnosis not present

## 2015-06-22 DIAGNOSIS — I739 Peripheral vascular disease, unspecified: Secondary | ICD-10-CM | POA: Diagnosis not present

## 2015-06-22 NOTE — Telephone Encounter (Signed)
Pt reports fowl smell in urine no other symptom to report.Norva Pavlov RN collect urine.. Please advise [name] at [phone]

## 2015-06-22 NOTE — Telephone Encounter (Signed)
Fine for [name] to collect urine for UA and culture

## 2015-06-22 NOTE — Telephone Encounter (Signed)
Please advise 

## 2015-06-22 NOTE — Telephone Encounter (Signed)
Notified Ms. Click of Dr.Walkers comments

## 2015-06-23 DIAGNOSIS — I739 Peripheral vascular disease, unspecified: Secondary | ICD-10-CM | POA: Diagnosis not present

## 2015-06-23 DIAGNOSIS — N390 Urinary tract infection, site not specified: Secondary | ICD-10-CM | POA: Diagnosis not present

## 2015-06-23 DIAGNOSIS — I509 Heart failure, unspecified: Secondary | ICD-10-CM | POA: Diagnosis not present

## 2015-06-23 DIAGNOSIS — Z87440 Personal history of urinary (tract) infections: Secondary | ICD-10-CM | POA: Diagnosis not present

## 2015-06-23 DIAGNOSIS — R32 Unspecified urinary incontinence: Secondary | ICD-10-CM | POA: Diagnosis not present

## 2015-06-23 DIAGNOSIS — M25552 Pain in left hip: Secondary | ICD-10-CM | POA: Diagnosis not present

## 2015-06-23 DIAGNOSIS — L989 Disorder of the skin and subcutaneous tissue, unspecified: Secondary | ICD-10-CM | POA: Diagnosis not present

## 2015-06-23 DIAGNOSIS — M79606 Pain in leg, unspecified: Secondary | ICD-10-CM | POA: Diagnosis not present

## 2015-06-26 DIAGNOSIS — I509 Heart failure, unspecified: Secondary | ICD-10-CM | POA: Diagnosis not present

## 2015-06-26 DIAGNOSIS — L989 Disorder of the skin and subcutaneous tissue, unspecified: Secondary | ICD-10-CM | POA: Diagnosis not present

## 2015-06-26 DIAGNOSIS — M25552 Pain in left hip: Secondary | ICD-10-CM | POA: Diagnosis not present

## 2015-06-26 DIAGNOSIS — I739 Peripheral vascular disease, unspecified: Secondary | ICD-10-CM | POA: Diagnosis not present

## 2015-06-26 DIAGNOSIS — R32 Unspecified urinary incontinence: Secondary | ICD-10-CM | POA: Diagnosis not present

## 2015-06-26 DIAGNOSIS — M79606 Pain in leg, unspecified: Secondary | ICD-10-CM | POA: Diagnosis not present

## 2015-07-01 DIAGNOSIS — I509 Heart failure, unspecified: Secondary | ICD-10-CM | POA: Diagnosis not present

## 2015-07-01 DIAGNOSIS — I739 Peripheral vascular disease, unspecified: Secondary | ICD-10-CM | POA: Diagnosis not present

## 2015-07-01 DIAGNOSIS — L989 Disorder of the skin and subcutaneous tissue, unspecified: Secondary | ICD-10-CM | POA: Diagnosis not present

## 2015-07-01 DIAGNOSIS — R32 Unspecified urinary incontinence: Secondary | ICD-10-CM | POA: Diagnosis not present

## 2015-07-01 DIAGNOSIS — M79606 Pain in leg, unspecified: Secondary | ICD-10-CM | POA: Diagnosis not present

## 2015-07-01 DIAGNOSIS — M25552 Pain in left hip: Secondary | ICD-10-CM | POA: Diagnosis not present

## 2015-07-02 DIAGNOSIS — I739 Peripheral vascular disease, unspecified: Secondary | ICD-10-CM | POA: Diagnosis not present

## 2015-07-02 DIAGNOSIS — L989 Disorder of the skin and subcutaneous tissue, unspecified: Secondary | ICD-10-CM | POA: Diagnosis not present

## 2015-07-02 DIAGNOSIS — M25552 Pain in left hip: Secondary | ICD-10-CM | POA: Diagnosis not present

## 2015-07-02 DIAGNOSIS — I509 Heart failure, unspecified: Secondary | ICD-10-CM | POA: Diagnosis not present

## 2015-07-02 DIAGNOSIS — M79606 Pain in leg, unspecified: Secondary | ICD-10-CM | POA: Diagnosis not present

## 2015-07-02 DIAGNOSIS — R32 Unspecified urinary incontinence: Secondary | ICD-10-CM | POA: Diagnosis not present

## 2015-07-03 ENCOUNTER — Telehealth: Payer: Self-pay | Admitting: Internal Medicine

## 2015-07-03 NOTE — Telephone Encounter (Signed)
Please advise 

## 2015-07-03 NOTE — Telephone Encounter (Signed)
Fine that she refused home health today

## 2015-07-03 NOTE — Telephone Encounter (Signed)
Nancy [phone] called from [facility] regarding pt refused a visit today for OT due to family coming to visit. Thank you!

## 2015-07-06 DIAGNOSIS — M25552 Pain in left hip: Secondary | ICD-10-CM | POA: Diagnosis not present

## 2015-07-06 DIAGNOSIS — I509 Heart failure, unspecified: Secondary | ICD-10-CM | POA: Diagnosis not present

## 2015-07-06 DIAGNOSIS — M79606 Pain in leg, unspecified: Secondary | ICD-10-CM | POA: Diagnosis not present

## 2015-07-06 DIAGNOSIS — L989 Disorder of the skin and subcutaneous tissue, unspecified: Secondary | ICD-10-CM | POA: Diagnosis not present

## 2015-07-06 DIAGNOSIS — I739 Peripheral vascular disease, unspecified: Secondary | ICD-10-CM | POA: Diagnosis not present

## 2015-07-06 DIAGNOSIS — R32 Unspecified urinary incontinence: Secondary | ICD-10-CM | POA: Diagnosis not present

## 2015-07-09 DIAGNOSIS — L989 Disorder of the skin and subcutaneous tissue, unspecified: Secondary | ICD-10-CM | POA: Diagnosis not present

## 2015-07-09 DIAGNOSIS — M25552 Pain in left hip: Secondary | ICD-10-CM | POA: Diagnosis not present

## 2015-07-09 DIAGNOSIS — R32 Unspecified urinary incontinence: Secondary | ICD-10-CM | POA: Diagnosis not present

## 2015-07-09 DIAGNOSIS — I739 Peripheral vascular disease, unspecified: Secondary | ICD-10-CM | POA: Diagnosis not present

## 2015-07-09 DIAGNOSIS — I509 Heart failure, unspecified: Secondary | ICD-10-CM | POA: Diagnosis not present

## 2015-07-09 DIAGNOSIS — M79606 Pain in leg, unspecified: Secondary | ICD-10-CM | POA: Diagnosis not present

## 2015-07-13 DIAGNOSIS — M25552 Pain in left hip: Secondary | ICD-10-CM | POA: Diagnosis not present

## 2015-07-13 DIAGNOSIS — I739 Peripheral vascular disease, unspecified: Secondary | ICD-10-CM | POA: Diagnosis not present

## 2015-07-13 DIAGNOSIS — I509 Heart failure, unspecified: Secondary | ICD-10-CM | POA: Diagnosis not present

## 2015-07-13 DIAGNOSIS — M79606 Pain in leg, unspecified: Secondary | ICD-10-CM | POA: Diagnosis not present

## 2015-07-13 DIAGNOSIS — L989 Disorder of the skin and subcutaneous tissue, unspecified: Secondary | ICD-10-CM | POA: Diagnosis not present

## 2015-07-13 DIAGNOSIS — R32 Unspecified urinary incontinence: Secondary | ICD-10-CM | POA: Diagnosis not present

## 2015-07-14 ENCOUNTER — Ambulatory Visit (INDEPENDENT_AMBULATORY_CARE_PROVIDER_SITE_OTHER): Payer: Medicare Other

## 2015-07-14 VITALS — BP 126/62 | HR 72 | Temp 97.7°F | Resp 14 | Ht 60.0 in | Wt 171.0 lb

## 2015-07-14 DIAGNOSIS — Z0000 Encounter for general adult medical examination without abnormal findings: Secondary | ICD-10-CM

## 2015-07-14 NOTE — Progress Notes (Signed)
Subjective:   Cindy Graham is a 82 y.o. female who presents for Medicare Annual (Subsequent) preventive examination.  Review of Systems:  No ROS.  Medicare Wellness Visit.  Cardiac Risk Factors include: advanced age (>25men, >52 women);hypertension     Objective:     Vitals: BP 126/62 mmHg  Pulse 72  Temp(Src) 97.7 F (36.5 C) (Oral)  Resp 14  Ht 5' (1.524 m)  Wt 171 lb (77.565 kg)  BMI 33.40 kg/m2  SpO2 95%  Tobacco History  Smoking status  . Never Smoker   Smokeless tobacco  . Never Used     Counseling given: Not Answered   Past Medical History  Diagnosis Date  . Hypertension   . Hyperlipidemia   . Hernia   . Urine incontinence   . COPD (chronic obstructive pulmonary disease) ([place])   . Arthritis     osteo  . GERD (gastroesophageal reflux disease)   . Heart murmur   . Syncope and collapse   . Melanoma [facility])     face   Past Surgical History  Procedure Laterality Date  . Abdominal hysterectomy    . Cholecystectomy    . Hernia repair     Family History  Problem Relation Age of Onset  . Stroke Father   . Heart disease Sister   . Cancer Brother     colon  . Heart disease Brother   . Cancer Sister     colon   History  Sexual Activity  . Sexual Activity: Not Currently    Outpatient Encounter Prescriptions as of 07/14/2015  Medication Sig  . aspirin 81 MG tablet Take 81 mg by mouth daily.    Marland Kitchen atenolol (TENORMIN) 25 MG tablet TAKE ONE (1) TABLET BY MOUTH EVERY DAY  . Biotin 1000 MCG tablet Take 1,000 mcg by mouth daily.    . cholecalciferol (VITAMIN D) 400 UNITS TABS Take 400 Units by mouth 2 (two) times daily.    . enalapril (VASOTEC) 20 MG tablet Take 1 tablet (20 mg total) by mouth 2 (two) times daily.  . Flaxseed, Linseed, (FLAX SEED OIL) 1000 MG CAPS Take 1 capsule by mouth daily.    Marland Kitchen gabapentin (NEURONTIN) 300 MG capsule TAKE ONE (1) CAPSULE EACH DAY  . Multiple Vitamin (MULTIVITAMIN) capsule Take 1 capsule by mouth daily.    Marland Kitchen  NITROSTAT 0.3 MG SL tablet DISSOLVE 1 TABLET UNDER TONGUE EVERY 5 MINUTES FOR 3 DOSES AS NEEDED FOR CHEST PAIN. IF NO RELIEF GO TO ER.  . NUCYNTA 50 MG TABS tablet Take 50 mg by mouth 2 (two) times daily as needed (Take 1 tablet twice a day as needed).   . nystatin (MYCOSTATIN/NYSTOP) 100000 UNIT/GM POWD Apply 1 application topically BID to affected area until rash resolves  . nystatin cream (MYCOSTATIN) Apply 1 application topically 2 (two) times daily.  Marland Kitchen omeprazole (PRILOSEC) 20 MG capsule TAKE ONE CAPSULE BY MOUTH DAILY  . oxybutynin (DITROPAN) 5 MG tablet TAKE ONE TABLET EVERY DAY  . sertraline (ZOLOFT) 100 MG tablet TAKE ONE (1) TABLET EACH DAY  . triamterene-hydrochlorothiazide (DYAZIDE) 37.5-25 MG per capsule TAKE ONE CAPSULE BY MOUTH DAILY  . verapamil (CALAN-SR) 180 MG CR tablet TAKE ONE TABLET DAILY AT BEDTIME  . [DISCONTINUED] chlorpheniramine-HYDROcodone (TUSSIONEX PENNKINETIC ER) 10-8 MG/5ML SUER Take 5 mLs by mouth every 12 (twelve) hours as needed.  . [DISCONTINUED] levofloxacin (LEVAQUIN) 250 MG tablet 2 tablets on Day 1, then 1 tablet daily for 6 days.   No facility-administered  encounter medications on file as of 07/14/2015.    Activities of Daily Living In your present state of health, do you have any difficulty performing the following activities: 07/14/2015  Hearing? Y  Vision? N  Difficulty concentrating or making decisions? N  Walking or climbing stairs? Y  Dressing or bathing? N  Doing errands, shopping? Y  Preparing Food and eating ? Y  Using the Toilet? N  In the past six months, have you accidently leaked urine? Y  Do you have problems with loss of bowel control? N  Managing your Medications? N  Managing your Finances? Y  Housekeeping or managing your Housekeeping? Y    Patient Care Team: Ronette Deter, MD as PCP - General (Internal Medicine)    Assessment:    This is a routine wellness examination for Cindy Graham. The goal of the wellness visit is to assist  the patient how to close the gaps in care and create a preventative care plan for the patient.   Taking VIT D Calcium as appropriate/Osteoporosis risk reviewed.  Medications reviewed; taking without issues or barriers.  Safety issues reviewed; smoke detectors in the home. No firearms in the home. Wears seatbelts when driving or riding with others. No violence in the home.  No identified risk were noted; The patient was oriented x 3; appropriate in dress and manner and no objective failures at ADL's or IADL's.   TDAP/Zostavax vaccine postponed for insurance follow up.  Reports having DEXA Scan completed and will follow up with documentation.  Patient Concerns: Need for routine follow up; appointment scheduled.    Exercise Activities and Dietary recommendations Current Exercise Habits:: Structured exercise class (PT exercises), Time (Minutes): 20, Frequency (Times/Week): > 6, Weekly Exercise (Minutes/Week): 0, Intensity: Mild  Goals    . Healthy Lifestyle     Maintain PT exercise routine Drink plenty of water, stay hydrated Choose low carb foods, fruits and vegetables       Fall Risk Fall Risk  07/14/2015 07/16/2014 01/15/2014 04/12/2012  Falls in the past year? No No No No   Depression Screen PHQ 2/9 Scores 07/14/2015 07/16/2014 01/15/2014 04/12/2012  PHQ - 2 Score 0 0 1 0     Cognitive Testing MMSE - Mini Mental State Exam 07/14/2015  Orientation to time 5  Orientation to Place 5  Registration 3  Attention/ Calculation 5  Recall 2  Recall-comments 2/3 recalled  Language- name 2 objects 2  Language- repeat 1  Language- follow 3 step command 3  Language- read & follow direction 1  Write a sentence 0  Write a sentence-comments Difficulty writing. Tremors.  Copy design 1  Total score 28    Immunization History  Administered Date(s) Administered  . Influenza Split 02/25/2011, 02/14/2012, 02/25/2015  . Influenza-Unspecified 02/20/2013, 03/15/2014  . Pneumococcal  Conjugate-13 06/28/2013  . Pneumococcal Polysaccharide-23 10/25/2001   Screening Tests Health Maintenance  Topic Date Due  . TETANUS/TDAP  08/10/1942  . ZOSTAVAX  08/10/1983  . DEXA SCAN  08/09/1988  . INFLUENZA VACCINE  12/22/2015  . PNA vac Low Risk Adult  Completed      Plan:   End of life planning; Advance aging; Advanced directives discussed. Copy of current HCPOA/Living Will requested.   During the course of the visit the patient was educated and counseled about the following appropriate screening and preventive services:   Vaccines to include Pneumoccal, Influenza, Hepatitis B, Td, Zostavax, HCV  Electrocardiogram  Cardiovascular Disease  Colorectal cancer screening  Bone density screening  Diabetes screening  Glaucoma screening  Mammography/PAP  Nutrition counseling   Patient Instructions (the written plan) was given to the patient.   Varney Biles, LPN  [phone]

## 2015-07-14 NOTE — Patient Instructions (Addendum)
Cindy Graham,  Thank you for taking time to come for your Medicare Wellness Visit. I appreciate your ongoing commitment to your health goals. Please review the following plan we discussed and let me know if I can assist you in the future.   Return in March for scheduled follow up with Dr. Gilford Rile.   This is a list of the screening recommended for you and due dates:  Health Maintenance  Topic Date Due  . Tetanus Vaccine  08/10/1942  . Shingles Vaccine  08/10/1983  . DEXA scan (bone density measurement)  08/09/1988  . Flu Shot  12/22/2015  . Pneumonia vaccines  Completed    Fall Prevention in the Home  Falls can cause injuries. They can happen to people of all ages. There are many things you can do to make your home safe and to help prevent falls.  WHAT CAN I DO ON THE OUTSIDE OF MY HOME?  Regularly fix the edges of walkways and driveways and fix any cracks.  Remove anything that might make you trip as you walk through a door, such as a raised step or threshold.  Trim any bushes or trees on the path to your home.  Use bright outdoor lighting.  Clear any walking paths of anything that might make someone trip, such as rocks or tools.  Regularly check to see if handrails are loose or broken. Make sure that both sides of any steps have handrails.  Any raised decks and porches should have guardrails on the edges.  Have any leaves, snow, or ice cleared regularly.  Use sand or salt on walking paths during winter.  Clean up any spills in your garage right away. This includes oil or grease spills. WHAT CAN I DO IN THE BATHROOM?   Use night lights.  Install grab bars by the toilet and in the tub and shower. Do not use towel bars as grab bars.  Use non-skid mats or decals in the tub or shower.  If you need to sit down in the shower, use a plastic, non-slip stool.  Keep the floor dry. Clean up any water that spills on the floor as soon as it happens.  Remove soap buildup in the  tub or shower regularly.  Attach bath mats securely with double-sided non-slip rug tape.  Do not have throw rugs and other things on the floor that can make you trip. WHAT CAN I DO IN THE BEDROOM?  Use night lights.  Make sure that you have a light by your bed that is easy to reach.  Do not use any sheets or blankets that are too big for your bed. They should not hang down onto the floor.  Have a firm chair that has side arms. You can use this for support while you get dressed.  Do not have throw rugs and other things on the floor that can make you trip. WHAT CAN I DO IN THE KITCHEN?  Clean up any spills right away.  Avoid walking on wet floors.  Keep items that you use a lot in easy-to-reach places.  If you need to reach something above you, use a strong step stool that has a grab bar.  Keep electrical cords out of the way.  Do not use floor polish or wax that makes floors slippery. If you must use wax, use non-skid floor wax.  Do not have throw rugs and other things on the floor that can make you trip. WHAT CAN I DO WITH MY STAIRS?  Do not leave any items on the stairs.  Make sure that there are handrails on both sides of the stairs and use them. Fix handrails that are broken or loose. Make sure that handrails are as long as the stairways.  Check any carpeting to make sure that it is firmly attached to the stairs. Fix any carpet that is loose or worn.  Avoid having throw rugs at the top or bottom of the stairs. If you do have throw rugs, attach them to the floor with carpet tape.  Make sure that you have a light switch at the top of the stairs and the bottom of the stairs. If you do not have them, ask someone to add them for you. WHAT ELSE CAN I DO TO HELP PREVENT FALLS?  Wear shoes that:  Do not have high heels.  Have rubber bottoms.  Are comfortable and fit you well.  Are closed at the toe. Do not wear sandals.  If you use a stepladder:  Make sure that it  is fully opened. Do not climb a closed stepladder.  Make sure that both sides of the stepladder are locked into place.  Ask someone to hold it for you, if possible.  Clearly mark and make sure that you can see:  Any grab bars or handrails.  First and last steps.  Where the edge of each step is.  Use tools that help you move around (mobility aids) if they are needed. These include:  Canes.  Walkers.  Scooters.  Crutches.  Turn on the lights when you go into a dark area. Replace any light bulbs as soon as they burn out.  Set up your furniture so you have a clear path. Avoid moving your furniture around.  If any of your floors are uneven, fix them.  If there are any pets around you, be aware of where they are.  Review your medicines with your doctor. Some medicines can make you feel dizzy. This can increase your chance of falling. Ask your doctor what other things that you can do to help prevent falls.   This information is not intended to replace advice given to you by your health care provider. Make sure you discuss any questions you have with your health care provider.   Document Released: 03/05/2009 Document Revised: 09/23/2014 Document Reviewed: 06/13/2014 Elsevier Interactive Patient Education [employer].

## 2015-07-14 NOTE — Progress Notes (Signed)
Annual Wellness Visit as completed by Health Coach was reviewed in full.  

## 2015-07-16 DIAGNOSIS — M79606 Pain in leg, unspecified: Secondary | ICD-10-CM | POA: Diagnosis not present

## 2015-07-16 DIAGNOSIS — I509 Heart failure, unspecified: Secondary | ICD-10-CM | POA: Diagnosis not present

## 2015-07-16 DIAGNOSIS — M25552 Pain in left hip: Secondary | ICD-10-CM | POA: Diagnosis not present

## 2015-07-16 DIAGNOSIS — L989 Disorder of the skin and subcutaneous tissue, unspecified: Secondary | ICD-10-CM | POA: Diagnosis not present

## 2015-07-16 DIAGNOSIS — I739 Peripheral vascular disease, unspecified: Secondary | ICD-10-CM | POA: Diagnosis not present

## 2015-07-16 DIAGNOSIS — R32 Unspecified urinary incontinence: Secondary | ICD-10-CM | POA: Diagnosis not present

## 2015-07-17 ENCOUNTER — Telehealth: Payer: Self-pay | Admitting: Internal Medicine

## 2015-07-17 NOTE — Telephone Encounter (Signed)
[facility] called regarding the pt prescription. See below. [phone].

## 2015-07-17 NOTE — Telephone Encounter (Signed)
Please advise 

## 2015-07-17 NOTE — Telephone Encounter (Signed)
Please advise, per pt she is taking medication only once daily, ok to change prescription?

## 2015-07-17 NOTE — Telephone Encounter (Signed)
I have not changed her medication. Unless another provider has changed her medication, she should take it the same way she has been taking it.

## 2015-07-17 NOTE — Telephone Encounter (Signed)
I spoke with the pharmacy, they are concerned as the patient has only been taking one pill for a long time.  I called the patient, she has only been taking one for "A while" can I change the prescription? Thanks

## 2015-07-18 NOTE — Telephone Encounter (Signed)
Yes, fine to change to once daily

## 2015-07-20 ENCOUNTER — Telehealth: Payer: Self-pay | Admitting: Internal Medicine

## 2015-07-20 DIAGNOSIS — I739 Peripheral vascular disease, unspecified: Secondary | ICD-10-CM | POA: Diagnosis not present

## 2015-07-20 DIAGNOSIS — R32 Unspecified urinary incontinence: Secondary | ICD-10-CM | POA: Diagnosis not present

## 2015-07-20 DIAGNOSIS — M25552 Pain in left hip: Secondary | ICD-10-CM | POA: Diagnosis not present

## 2015-07-20 DIAGNOSIS — M79606 Pain in leg, unspecified: Secondary | ICD-10-CM | POA: Diagnosis not present

## 2015-07-20 DIAGNOSIS — I509 Heart failure, unspecified: Secondary | ICD-10-CM | POA: Diagnosis not present

## 2015-07-20 DIAGNOSIS — L989 Disorder of the skin and subcutaneous tissue, unspecified: Secondary | ICD-10-CM | POA: Diagnosis not present

## 2015-07-20 NOTE — Telephone Encounter (Signed)
Called [facility] and changed prescription to once a day.

## 2015-07-20 NOTE — Telephone Encounter (Signed)
Linda Click [id number] [phone] [facility] regarding pt started last Thursday night being hoarse and had chills. Yesterday coughing up and blowing green mucus. She hears ronchi in the right lower lob. Oxygen 98 percent at rest dropped to 89 percent with ambulation of about 50 feet come back up to 96 percent at rest. Pt states she does not feel as sick as she has in the past, but Georgiann Cocker is concerned and would it be concerned her to have a mobile chest xray. Thank you!

## 2015-07-20 NOTE — Telephone Encounter (Signed)
Pt needs orders for mobile chest xray

## 2015-07-22 ENCOUNTER — Telehealth: Payer: Self-pay | Admitting: *Deleted

## 2015-07-22 NOTE — Telephone Encounter (Signed)
Is there any results for Chest xray done on this pt ? Pt is still coughing up green mucus. Please advise, thanks

## 2015-07-22 NOTE — Telephone Encounter (Signed)
I have not seen any results on a recent xray. She should be seen in a visit if ill

## 2015-07-22 NOTE — Telephone Encounter (Signed)
[facility] with [facility] has requested a update, patient still has green mucus when she coughs.  [name] contact [phone]

## 2015-07-22 NOTE — Telephone Encounter (Signed)
Pt is coming in 07/23/15

## 2015-07-23 ENCOUNTER — Other Ambulatory Visit: Payer: Self-pay | Admitting: Internal Medicine

## 2015-07-23 ENCOUNTER — Ambulatory Visit (INDEPENDENT_AMBULATORY_CARE_PROVIDER_SITE_OTHER)
Admission: RE | Admit: 2015-07-23 | Discharge: 2015-07-23 | Disposition: A | Discharge status: Home / Self Care | Payer: Medicare Other | Source: Ambulatory Visit | Attending: Internal Medicine | Admitting: Internal Medicine

## 2015-07-23 ENCOUNTER — Encounter: Payer: Self-pay | Admitting: Internal Medicine

## 2015-07-23 ENCOUNTER — Ambulatory Visit (INDEPENDENT_AMBULATORY_CARE_PROVIDER_SITE_OTHER): Payer: Medicare Other | Admitting: Internal Medicine

## 2015-07-23 VITALS — BP 124/71 | HR 71 | Temp 98.0°F | Wt 185.2 lb

## 2015-07-23 DIAGNOSIS — R509 Fever, unspecified: Secondary | ICD-10-CM | POA: Diagnosis not present

## 2015-07-23 DIAGNOSIS — R05 Cough: Secondary | ICD-10-CM

## 2015-07-23 DIAGNOSIS — R059 Cough, unspecified: Secondary | ICD-10-CM

## 2015-07-23 MED ORDER — LEVOFLOXACIN 250 MG PO TABS: ORAL_TABLET | ORAL | Status: DC

## 2015-07-23 NOTE — Assessment & Plan Note (Signed)
Symptoms and exam concerning for RLL pneumonia. Will get CXR. Start Levaquin, renal dose. Continue supplemental oxygen at night. Recheck in 1-2 weeks.

## 2015-07-23 NOTE — Progress Notes (Signed)
Pre visit review using our clinic review tool, if applicable. No additional management support is needed unless otherwise documented below in the visit note. 

## 2015-07-23 NOTE — Progress Notes (Signed)
Subjective:    Patient ID: Cindy Graham, female    DOB: [birth year], 82 y.o.   MRN: [id number]  HPI  82YO female presents for acute visit.  Cough - Cough and congestion started about 2 weeks ago.  Seen by Dr. Lacinda Axon in 05/2015. Treated with Levaquin for 7 days for possible pneumonia. Had some improvement, however symptoms recurred with some fever this time. Coughing up green mucous. No dyspnea. Some sore throat and congestion noted. Not taking anything for this. Using oxygen just a night.   Wt Readings from Last 3 Encounters:  07/23/15 185 lb 4 oz (84.029 kg)  07/14/15 171 lb (77.565 kg)  06/03/15 184 lb 2 oz (83.519 kg)   BP Readings from Last 3 Encounters:  07/23/15 124/71  07/14/15 126/62  06/03/15 112/60    Past Medical History  Diagnosis Date  . Hypertension   . Hyperlipidemia   . Hernia   . Urine incontinence   . COPD (chronic obstructive pulmonary disease) ([place])   . Arthritis     osteo  . GERD (gastroesophageal reflux disease)   . Heart murmur   . Syncope and collapse   . Melanoma ([place])     face   Family History  Problem Relation Age of Onset  . Stroke Father   . Heart disease Sister   . Cancer Brother     colon  . Heart disease Brother   . Cancer Sister     colon   Past Surgical History  Procedure Laterality Date  . Abdominal hysterectomy    . Cholecystectomy    . Hernia repair     Social History   Social History  . Marital Status: Married    Spouse Name: N/A  . Number of Children: N/A  . Years of Education: N/A   Social History Main Topics  . Smoking status: Never Smoker   . Smokeless tobacco: Never Used  . Alcohol Use: No  . Drug Use: No  . Sexual Activity: Not Currently   Other Topics Concern  . None   Social History Narrative    Review of Systems  Constitutional: Positive for fever, chills and fatigue. Negative for unexpected weight change.  HENT: Positive for congestion, postnasal drip, rhinorrhea, sinus pressure and sore  throat. Negative for ear discharge, ear pain, facial swelling, hearing loss, mouth sores, nosebleeds, sneezing, tinnitus, trouble swallowing and voice change.   Eyes: Negative for pain, discharge, redness and visual disturbance.  Respiratory: Positive for cough. Negative for chest tightness, shortness of breath, wheezing and stridor.   Cardiovascular: Negative for chest pain, palpitations and leg swelling.  Musculoskeletal: Negative for myalgias, arthralgias, neck pain and neck stiffness.  Skin: Negative for color change and rash.  Neurological: Negative for dizziness, weakness, light-headedness and headaches.  Hematological: Negative for adenopathy.       Objective:    BP 124/71 mmHg  Pulse 71  Temp(Src) 98 F (36.7 C) (Oral)  Wt 185 lb 4 oz (84.029 kg)  SpO2 93% Physical Exam  Constitutional: She is oriented to person, place, and time. She appears well-developed and well-nourished. No distress.  HENT:  Head: Normocephalic and atraumatic.  Right Ear: External ear normal.  Left Ear: External ear normal.  Nose: Nose normal.  Mouth/Throat: Oropharynx is clear and moist. No oropharyngeal exudate.  Eyes: Conjunctivae are normal. Pupils are equal, round, and reactive to light. Right eye exhibits no discharge. Left eye exhibits no discharge. No scleral icterus.  Neck: Normal range of motion. Neck  supple. No tracheal deviation present. No thyromegaly present.  Cardiovascular: Normal rate, regular rhythm, normal heart sounds and intact distal pulses.  Exam reveals no gallop and no friction rub.   No murmur heard. Pulmonary/Chest: Effort normal. No accessory muscle usage. No tachypnea. No respiratory distress. She has no decreased breath sounds. She has no wheezes. She has rhonchi in the right middle field and the right lower field. She has no rales. She exhibits no tenderness.  Musculoskeletal: Normal range of motion. She exhibits no edema or tenderness.  Lymphadenopathy:    She has no  cervical adenopathy.  Neurological: She is alert and oriented to person, place, and time. No cranial nerve deficit. She exhibits normal muscle tone. Coordination normal.  Skin: Skin is warm and dry. No rash noted. She is not diaphoretic. No erythema. No pallor.  Psychiatric: She has a normal mood and affect. Her behavior is normal. Judgment and thought content normal.          Assessment & Plan:   Problem List Items Addressed This Visit      Unprioritized   Cough - Primary    Symptoms and exam concerning for RLL pneumonia. Will get CXR. Start Levaquin, renal dose. Continue supplemental oxygen at night. Recheck in 1-2 weeks.      Relevant Medications   levofloxacin (LEVAQUIN) 250 MG tablet   Other Relevant Orders   DG Chest 2 View       No Follow-up on file.  Ronette Deter, MD Internal Medicine [facility]

## 2015-07-23 NOTE — Patient Instructions (Signed)
Chest xray today.  Start Levaquin. Take 2 tablets today, then 1 tablet daily for 6 more days.  Follow up for recheck in 1-2 weeks.  Call sooner if symptoms are not improving.

## 2015-07-24 ENCOUNTER — Telehealth: Payer: Self-pay | Admitting: Internal Medicine

## 2015-07-24 DIAGNOSIS — R32 Unspecified urinary incontinence: Secondary | ICD-10-CM | POA: Diagnosis not present

## 2015-07-24 DIAGNOSIS — M25552 Pain in left hip: Secondary | ICD-10-CM | POA: Diagnosis not present

## 2015-07-24 DIAGNOSIS — I509 Heart failure, unspecified: Secondary | ICD-10-CM | POA: Diagnosis not present

## 2015-07-24 DIAGNOSIS — M79606 Pain in leg, unspecified: Secondary | ICD-10-CM | POA: Diagnosis not present

## 2015-07-24 DIAGNOSIS — L989 Disorder of the skin and subcutaneous tissue, unspecified: Secondary | ICD-10-CM | POA: Diagnosis not present

## 2015-07-24 DIAGNOSIS — I739 Peripheral vascular disease, unspecified: Secondary | ICD-10-CM | POA: Diagnosis not present

## 2015-07-24 NOTE — Telephone Encounter (Signed)
Linda Click [id number] [phone] called from [facility] regarding needing the diagnosis from the visit from yesterday 03/02 and the pt would like a call with results from the chest xray. Thank you!

## 2015-07-24 NOTE — Telephone Encounter (Signed)
Spoke with Cindy Graham and gave her the diagnosis and chest xray results

## 2015-07-27 DIAGNOSIS — M25552 Pain in left hip: Secondary | ICD-10-CM | POA: Diagnosis not present

## 2015-07-27 DIAGNOSIS — L989 Disorder of the skin and subcutaneous tissue, unspecified: Secondary | ICD-10-CM | POA: Diagnosis not present

## 2015-07-27 DIAGNOSIS — R32 Unspecified urinary incontinence: Secondary | ICD-10-CM | POA: Diagnosis not present

## 2015-07-27 DIAGNOSIS — I509 Heart failure, unspecified: Secondary | ICD-10-CM | POA: Diagnosis not present

## 2015-07-27 DIAGNOSIS — I739 Peripheral vascular disease, unspecified: Secondary | ICD-10-CM | POA: Diagnosis not present

## 2015-07-27 DIAGNOSIS — M79606 Pain in leg, unspecified: Secondary | ICD-10-CM | POA: Diagnosis not present

## 2015-07-29 DIAGNOSIS — K219 Gastro-esophageal reflux disease without esophagitis: Secondary | ICD-10-CM | POA: Diagnosis not present

## 2015-07-29 DIAGNOSIS — Z8701 Personal history of pneumonia (recurrent): Secondary | ICD-10-CM | POA: Diagnosis not present

## 2015-07-29 DIAGNOSIS — I110 Hypertensive heart disease with heart failure: Secondary | ICD-10-CM | POA: Diagnosis not present

## 2015-07-29 DIAGNOSIS — R32 Unspecified urinary incontinence: Secondary | ICD-10-CM | POA: Diagnosis not present

## 2015-07-29 DIAGNOSIS — L989 Disorder of the skin and subcutaneous tissue, unspecified: Secondary | ICD-10-CM | POA: Diagnosis not present

## 2015-07-29 DIAGNOSIS — J45909 Unspecified asthma, uncomplicated: Secondary | ICD-10-CM | POA: Diagnosis not present

## 2015-07-29 DIAGNOSIS — I739 Peripheral vascular disease, unspecified: Secondary | ICD-10-CM | POA: Diagnosis not present

## 2015-07-29 DIAGNOSIS — Z8501 Personal history of malignant neoplasm of esophagus: Secondary | ICD-10-CM | POA: Diagnosis not present

## 2015-07-29 DIAGNOSIS — I509 Heart failure, unspecified: Secondary | ICD-10-CM | POA: Diagnosis not present

## 2015-07-29 DIAGNOSIS — Z87440 Personal history of urinary (tract) infections: Secondary | ICD-10-CM | POA: Diagnosis not present

## 2015-07-29 DIAGNOSIS — Z7982 Long term (current) use of aspirin: Secondary | ICD-10-CM | POA: Diagnosis not present

## 2015-07-29 DIAGNOSIS — M2041 Other hammer toe(s) (acquired), right foot: Secondary | ICD-10-CM | POA: Diagnosis not present

## 2015-07-29 DIAGNOSIS — Z9981 Dependence on supplemental oxygen: Secondary | ICD-10-CM | POA: Diagnosis not present

## 2015-07-29 DIAGNOSIS — M19019 Primary osteoarthritis, unspecified shoulder: Secondary | ICD-10-CM | POA: Diagnosis not present

## 2015-07-30 ENCOUNTER — Encounter: Payer: Self-pay | Admitting: Internal Medicine

## 2015-07-31 DIAGNOSIS — L989 Disorder of the skin and subcutaneous tissue, unspecified: Secondary | ICD-10-CM | POA: Diagnosis not present

## 2015-07-31 DIAGNOSIS — I509 Heart failure, unspecified: Secondary | ICD-10-CM | POA: Diagnosis not present

## 2015-07-31 DIAGNOSIS — R32 Unspecified urinary incontinence: Secondary | ICD-10-CM | POA: Diagnosis not present

## 2015-07-31 DIAGNOSIS — Z8701 Personal history of pneumonia (recurrent): Secondary | ICD-10-CM | POA: Diagnosis not present

## 2015-07-31 DIAGNOSIS — I739 Peripheral vascular disease, unspecified: Secondary | ICD-10-CM | POA: Diagnosis not present

## 2015-07-31 DIAGNOSIS — I110 Hypertensive heart disease with heart failure: Secondary | ICD-10-CM | POA: Diagnosis not present

## 2015-08-04 DIAGNOSIS — Z8701 Personal history of pneumonia (recurrent): Secondary | ICD-10-CM | POA: Diagnosis not present

## 2015-08-04 DIAGNOSIS — I739 Peripheral vascular disease, unspecified: Secondary | ICD-10-CM | POA: Diagnosis not present

## 2015-08-04 DIAGNOSIS — I110 Hypertensive heart disease with heart failure: Secondary | ICD-10-CM | POA: Diagnosis not present

## 2015-08-04 DIAGNOSIS — I509 Heart failure, unspecified: Secondary | ICD-10-CM | POA: Diagnosis not present

## 2015-08-04 DIAGNOSIS — R32 Unspecified urinary incontinence: Secondary | ICD-10-CM | POA: Diagnosis not present

## 2015-08-04 DIAGNOSIS — L989 Disorder of the skin and subcutaneous tissue, unspecified: Secondary | ICD-10-CM | POA: Diagnosis not present

## 2015-08-11 DIAGNOSIS — Z8701 Personal history of pneumonia (recurrent): Secondary | ICD-10-CM | POA: Diagnosis not present

## 2015-08-11 DIAGNOSIS — R32 Unspecified urinary incontinence: Secondary | ICD-10-CM | POA: Diagnosis not present

## 2015-08-11 DIAGNOSIS — I509 Heart failure, unspecified: Secondary | ICD-10-CM | POA: Diagnosis not present

## 2015-08-11 DIAGNOSIS — I110 Hypertensive heart disease with heart failure: Secondary | ICD-10-CM | POA: Diagnosis not present

## 2015-08-11 DIAGNOSIS — L989 Disorder of the skin and subcutaneous tissue, unspecified: Secondary | ICD-10-CM | POA: Diagnosis not present

## 2015-08-11 DIAGNOSIS — I739 Peripheral vascular disease, unspecified: Secondary | ICD-10-CM | POA: Diagnosis not present

## 2015-08-13 ENCOUNTER — Encounter: Payer: Self-pay | Admitting: Internal Medicine

## 2015-08-13 ENCOUNTER — Ambulatory Visit (INDEPENDENT_AMBULATORY_CARE_PROVIDER_SITE_OTHER): Payer: Medicare Other | Admitting: Internal Medicine

## 2015-08-13 VITALS — BP 146/72 | HR 63 | Temp 97.9°F | Ht 64.0 in | Wt 185.2 lb

## 2015-08-13 DIAGNOSIS — N183 Chronic kidney disease, stage 3 unspecified: Secondary | ICD-10-CM

## 2015-08-13 DIAGNOSIS — M84375D Stress fracture, left foot, subsequent encounter for fracture with routine healing: Secondary | ICD-10-CM | POA: Diagnosis not present

## 2015-08-13 DIAGNOSIS — I739 Peripheral vascular disease, unspecified: Secondary | ICD-10-CM | POA: Diagnosis not present

## 2015-08-13 DIAGNOSIS — M2550 Pain in unspecified joint: Secondary | ICD-10-CM | POA: Diagnosis not present

## 2015-08-13 DIAGNOSIS — Z8701 Personal history of pneumonia (recurrent): Secondary | ICD-10-CM | POA: Diagnosis not present

## 2015-08-13 DIAGNOSIS — I509 Heart failure, unspecified: Secondary | ICD-10-CM | POA: Diagnosis not present

## 2015-08-13 DIAGNOSIS — I110 Hypertensive heart disease with heart failure: Secondary | ICD-10-CM | POA: Diagnosis not present

## 2015-08-13 LAB — COMPREHENSIVE METABOLIC PANEL
ALT: 13 U/L (ref 0–35)
AST: 22 U/L (ref 0–37)
Albumin: 3.6 g/dL (ref 3.5–5.2)
Alkaline Phosphatase: 63 U/L (ref 39–117)
BUN: 26 mg/dL — ABNORMAL HIGH (ref 6–23)
CALCIUM: 9.6 mg/dL (ref 8.4–10.5)
CHLORIDE: 103 meq/L (ref 96–112)
CO2: 26 meq/L (ref 19–32)
Creatinine, Ser: 1.28 mg/dL — ABNORMAL HIGH (ref 0.40–1.20)
GFR: 41.45 mL/min — AB (ref >60.00)
Glucose, Bld: 91 mg/dL (ref 70–99)
POTASSIUM: 4.4 meq/L (ref 3.5–5.1)
Sodium: 138 mEq/L (ref 135–145)
Total Bilirubin: 0.4 mg/dL (ref 0.2–1.2)
Total Protein: 7.2 g/dL (ref 6.0–8.3)

## 2015-08-13 NOTE — Assessment & Plan Note (Signed)
Worsening arthralgia in bilateral arms. Encouraged her to use Nucynta as prescribed for chronic OA pain by Dr. Sharlet Salina.

## 2015-08-13 NOTE — Assessment & Plan Note (Signed)
Reviewed notes from Dr. Vickki Muff. Encouraged her to keep legs elevated. Follow up with Dr. Vickki Muff as scheduled.

## 2015-08-13 NOTE — Progress Notes (Signed)
Pre visit review using our clinic review tool, if applicable. No additional management support is needed unless otherwise documented below in the visit note. 

## 2015-08-13 NOTE — Assessment & Plan Note (Signed)
Repeat renal function with labs today. 

## 2015-08-13 NOTE — Patient Instructions (Signed)
Labs today.  Consider talking with Dr. Sharlet Salina about pain in arms and increasing dose of Nucynta.  Continue to keep left leg elevated at home. Follow up with Dr. Vickki Muff as scheduled.

## 2015-08-13 NOTE — Progress Notes (Signed)
Subjective:    Patient ID: Cindy Graham, female    DOB: Nov 02, 1921, 82 y.o.   MRN: [id number]  HPI  82YO female presents for follow up.  Recently seen for URI. CXR was normal. Treated with Levaquin  Symptoms of cough improved. Still has some shortness of breath with walking, but this is chronic.  Her RN is concerned about her bilateral arm pain. She notes bilateral arm pain in her joints mostly, sometimes rated 10/10 in intensity. She is not taking anything for this. She is on Nucynta for chronic back pain as written by Dr. Sharlet Salina, however she has not been taking the full dose.  She was also recently seen for left foot purple discoloration, swelling and pain. Dr. Vickki Muff ordered xray which was normal, however noted high suspicion of metatarsal fracture. Discoloration and pain improving. Occasional swelling over mid anterior foot.  Wt Readings from Last 3 Encounters:  08/13/15 185 lb 4 oz (84.029 kg)  07/23/15 185 lb 4 oz (84.029 kg)  07/14/15 171 lb (77.565 kg)   BP Readings from Last 3 Encounters:  08/13/15 146/72  07/23/15 124/71  07/14/15 126/62    Past Medical History  Diagnosis Date  . Hypertension   . Hyperlipidemia   . Hernia   . Urine incontinence   . COPD (chronic obstructive pulmonary disease) ([place])   . Arthritis     osteo  . GERD (gastroesophageal reflux disease)   . Heart murmur   . Syncope and collapse   . Melanoma ([place])     face   Family History  Problem Relation Age of Onset  . Stroke Father   . Heart disease Sister   . Cancer Brother     colon  . Heart disease Brother   . Cancer Sister     colon   Past Surgical History  Procedure Laterality Date  . Abdominal hysterectomy    . Cholecystectomy    . Hernia repair     Social History   Social History  . Marital Status: Married    Spouse Name: N/A  . Number of Children: N/A  . Years of Education: N/A   Social History Main Topics  . Smoking status: Never Smoker   . Smokeless tobacco:  Never Used  . Alcohol Use: No  . Drug Use: No  . Sexual Activity: Not Currently   Other Topics Concern  . None   Social History Narrative    Review of Systems  Constitutional: Negative for fever, chills, appetite change, fatigue and unexpected weight change.  Eyes: Negative for visual disturbance.  Respiratory: Positive for shortness of breath. Negative for cough.   Cardiovascular: Negative for chest pain, palpitations and leg swelling.  Gastrointestinal: Negative for nausea, vomiting, abdominal pain, diarrhea and constipation.  Musculoskeletal: Positive for myalgias and arthralgias.  Skin: Positive for color change. Negative for rash.  Neurological: Negative for weakness.  Hematological: Negative for adenopathy. Does not bruise/bleed easily.  Psychiatric/Behavioral: Negative for dysphoric mood. The patient is not nervous/anxious.        Objective:    BP 146/72 mmHg  Pulse 63  Temp(Src) 97.9 F (36.6 C) (Oral)  Ht 5\' 4"  (1.626 m)  Wt 185 lb 4 oz (84.029 kg)  BMI 31.78 kg/m2  SpO2 96% Physical Exam  Constitutional: She is oriented to person, place, and time. She appears well-developed and well-nourished. No distress.  HENT:  Head: Normocephalic and atraumatic.  Right Ear: External ear normal.  Left Ear: External ear normal.  Nose:  Nose normal.  Mouth/Throat: Oropharynx is clear and moist. No oropharyngeal exudate.  Eyes: Conjunctivae are normal. Pupils are equal, round, and reactive to light. Right eye exhibits no discharge. Left eye exhibits no discharge. No scleral icterus.  Neck: Normal range of motion. Neck supple. No tracheal deviation present. No thyromegaly present.  Cardiovascular: Normal rate, regular rhythm, normal heart sounds and intact distal pulses.  Exam reveals no gallop and no friction rub.   No murmur heard. Pulmonary/Chest: Effort normal and breath sounds normal. No respiratory distress. She has no wheezes. She has no rales. She exhibits no tenderness.   Musculoskeletal: Normal range of motion. She exhibits no edema or tenderness.       Feet:  Lymphadenopathy:    She has no cervical adenopathy.  Neurological: She is alert and oriented to person, place, and time. No cranial nerve deficit. She exhibits normal muscle tone. Coordination normal.  Skin: Skin is warm and dry. No rash noted. She is not diaphoretic. No erythema. No pallor.  Psychiatric: She has a normal mood and affect. Her behavior is normal. Judgment and thought content normal.          Assessment & Plan:   Problem List Items Addressed This Visit      Unprioritized   Arthralgia    Worsening arthralgia in bilateral arms. Encouraged her to use Nucynta as prescribed for chronic OA pain by Dr. Sharlet Salina.       Chronic kidney disease (CKD), stage III (moderate) - Primary    Repeat renal function with labs today.      Relevant Orders   Comprehensive metabolic panel   Stress fracture of metatarsal bone of left foot due to acute injury    Reviewed notes from Dr. Vickki Muff. Encouraged her to keep legs elevated. Follow up with Dr. Vickki Muff as scheduled.          Return in about 3 months (around 11/13/2015) for Recheck.  Ronette Deter, MD Internal Medicine [facility]

## 2015-08-17 DIAGNOSIS — R32 Unspecified urinary incontinence: Secondary | ICD-10-CM | POA: Diagnosis not present

## 2015-08-17 DIAGNOSIS — I110 Hypertensive heart disease with heart failure: Secondary | ICD-10-CM | POA: Diagnosis not present

## 2015-08-17 DIAGNOSIS — I509 Heart failure, unspecified: Secondary | ICD-10-CM | POA: Diagnosis not present

## 2015-08-17 DIAGNOSIS — L989 Disorder of the skin and subcutaneous tissue, unspecified: Secondary | ICD-10-CM | POA: Diagnosis not present

## 2015-08-17 DIAGNOSIS — I739 Peripheral vascular disease, unspecified: Secondary | ICD-10-CM | POA: Diagnosis not present

## 2015-08-17 DIAGNOSIS — Z8701 Personal history of pneumonia (recurrent): Secondary | ICD-10-CM | POA: Diagnosis not present

## 2015-08-21 DIAGNOSIS — M5126 Other intervertebral disc displacement, lumbar region: Secondary | ICD-10-CM | POA: Diagnosis not present

## 2015-08-21 DIAGNOSIS — M5416 Radiculopathy, lumbar region: Secondary | ICD-10-CM | POA: Diagnosis not present

## 2015-08-21 DIAGNOSIS — M1612 Unilateral primary osteoarthritis, left hip: Secondary | ICD-10-CM | POA: Diagnosis not present

## 2015-08-26 DIAGNOSIS — I110 Hypertensive heart disease with heart failure: Secondary | ICD-10-CM | POA: Diagnosis not present

## 2015-08-26 DIAGNOSIS — I509 Heart failure, unspecified: Secondary | ICD-10-CM | POA: Diagnosis not present

## 2015-08-26 DIAGNOSIS — L989 Disorder of the skin and subcutaneous tissue, unspecified: Secondary | ICD-10-CM | POA: Diagnosis not present

## 2015-08-26 DIAGNOSIS — I739 Peripheral vascular disease, unspecified: Secondary | ICD-10-CM | POA: Diagnosis not present

## 2015-08-26 DIAGNOSIS — R32 Unspecified urinary incontinence: Secondary | ICD-10-CM | POA: Diagnosis not present

## 2015-08-26 DIAGNOSIS — Z8701 Personal history of pneumonia (recurrent): Secondary | ICD-10-CM | POA: Diagnosis not present

## 2015-08-31 ENCOUNTER — Other Ambulatory Visit: Payer: Self-pay | Admitting: Internal Medicine

## 2015-09-01 ENCOUNTER — Other Ambulatory Visit: Payer: Self-pay | Admitting: Internal Medicine

## 2015-09-01 DIAGNOSIS — I110 Hypertensive heart disease with heart failure: Secondary | ICD-10-CM | POA: Diagnosis not present

## 2015-09-01 DIAGNOSIS — Z8701 Personal history of pneumonia (recurrent): Secondary | ICD-10-CM | POA: Diagnosis not present

## 2015-09-01 DIAGNOSIS — R32 Unspecified urinary incontinence: Secondary | ICD-10-CM | POA: Diagnosis not present

## 2015-09-01 DIAGNOSIS — I509 Heart failure, unspecified: Secondary | ICD-10-CM | POA: Diagnosis not present

## 2015-09-01 DIAGNOSIS — I739 Peripheral vascular disease, unspecified: Secondary | ICD-10-CM | POA: Diagnosis not present

## 2015-09-01 DIAGNOSIS — L989 Disorder of the skin and subcutaneous tissue, unspecified: Secondary | ICD-10-CM | POA: Diagnosis not present

## 2015-09-01 NOTE — Telephone Encounter (Signed)
Rx refill sent to pharmacy. 

## 2015-09-07 DIAGNOSIS — L989 Disorder of the skin and subcutaneous tissue, unspecified: Secondary | ICD-10-CM | POA: Diagnosis not present

## 2015-09-07 DIAGNOSIS — R32 Unspecified urinary incontinence: Secondary | ICD-10-CM | POA: Diagnosis not present

## 2015-09-07 DIAGNOSIS — I509 Heart failure, unspecified: Secondary | ICD-10-CM | POA: Diagnosis not present

## 2015-09-07 DIAGNOSIS — Z8701 Personal history of pneumonia (recurrent): Secondary | ICD-10-CM | POA: Diagnosis not present

## 2015-09-07 DIAGNOSIS — I110 Hypertensive heart disease with heart failure: Secondary | ICD-10-CM | POA: Diagnosis not present

## 2015-09-07 DIAGNOSIS — I739 Peripheral vascular disease, unspecified: Secondary | ICD-10-CM | POA: Diagnosis not present

## 2015-09-09 DIAGNOSIS — R32 Unspecified urinary incontinence: Secondary | ICD-10-CM | POA: Diagnosis not present

## 2015-09-09 DIAGNOSIS — I110 Hypertensive heart disease with heart failure: Secondary | ICD-10-CM | POA: Diagnosis not present

## 2015-09-09 DIAGNOSIS — I739 Peripheral vascular disease, unspecified: Secondary | ICD-10-CM | POA: Diagnosis not present

## 2015-09-09 DIAGNOSIS — Z8701 Personal history of pneumonia (recurrent): Secondary | ICD-10-CM | POA: Diagnosis not present

## 2015-09-09 DIAGNOSIS — I509 Heart failure, unspecified: Secondary | ICD-10-CM | POA: Diagnosis not present

## 2015-09-09 DIAGNOSIS — L989 Disorder of the skin and subcutaneous tissue, unspecified: Secondary | ICD-10-CM | POA: Diagnosis not present

## 2015-09-16 ENCOUNTER — Telehealth: Payer: Self-pay | Admitting: Internal Medicine

## 2015-09-16 DIAGNOSIS — I110 Hypertensive heart disease with heart failure: Secondary | ICD-10-CM | POA: Diagnosis not present

## 2015-09-16 DIAGNOSIS — I739 Peripheral vascular disease, unspecified: Secondary | ICD-10-CM | POA: Diagnosis not present

## 2015-09-16 DIAGNOSIS — I509 Heart failure, unspecified: Secondary | ICD-10-CM | POA: Diagnosis not present

## 2015-09-16 DIAGNOSIS — Z8701 Personal history of pneumonia (recurrent): Secondary | ICD-10-CM | POA: Diagnosis not present

## 2015-09-16 DIAGNOSIS — L989 Disorder of the skin and subcutaneous tissue, unspecified: Secondary | ICD-10-CM | POA: Diagnosis not present

## 2015-09-16 DIAGNOSIS — R32 Unspecified urinary incontinence: Secondary | ICD-10-CM | POA: Diagnosis not present

## 2015-09-16 NOTE — Telephone Encounter (Signed)
We should see her in a visit. Anytime this week if possible, or may need to see another provider.

## 2015-09-16 NOTE — Telephone Encounter (Signed)
Cindy Graham called from [place] [facility] regarding pt has had a cough for approx two weeks up til today lungs have been clear. She has a wet sounding cough but non productive today [name] hears ralls in the right lower lobes same place where she was diagnosed with pneumonia twice over the last four or five months. Do you want to see her or treat without seeing her? Verbal order. Thank you!

## 2015-09-16 NOTE — Telephone Encounter (Signed)
Per Dr Gilford Rile patient should be seen.  Patient and assitant aware.  Appointment made.

## 2015-09-17 ENCOUNTER — Encounter: Payer: Self-pay | Admitting: Family Medicine

## 2015-09-17 ENCOUNTER — Ambulatory Visit (INDEPENDENT_AMBULATORY_CARE_PROVIDER_SITE_OTHER)
Admission: RE | Admit: 2015-09-17 | Discharge: 2015-09-17 | Disposition: A | Discharge status: Home / Self Care | Payer: Medicare Other | Source: Ambulatory Visit | Attending: Family Medicine | Admitting: Family Medicine

## 2015-09-17 ENCOUNTER — Ambulatory Visit (INDEPENDENT_AMBULATORY_CARE_PROVIDER_SITE_OTHER): Payer: Medicare Other | Admitting: Family Medicine

## 2015-09-17 VITALS — BP 126/72 | HR 62 | Temp 97.7°F | Ht 64.0 in | Wt 182.0 lb

## 2015-09-17 DIAGNOSIS — R05 Cough: Secondary | ICD-10-CM | POA: Diagnosis not present

## 2015-09-17 DIAGNOSIS — J209 Acute bronchitis, unspecified: Secondary | ICD-10-CM | POA: Insufficient documentation

## 2015-09-17 DIAGNOSIS — R053 Chronic cough: Secondary | ICD-10-CM

## 2015-09-17 DIAGNOSIS — J45909 Unspecified asthma, uncomplicated: Secondary | ICD-10-CM | POA: Insufficient documentation

## 2015-09-17 MED ORDER — PREDNISONE 50 MG PO TABS: ORAL_TABLET | ORAL | Status: DC

## 2015-09-17 MED ORDER — HYDROCOD POLST-CPM POLST ER 10-8 MG/5ML PO SUER: 5.0000 mL | Freq: Two times a day (BID) | ORAL | Status: DC | PRN

## 2015-09-17 NOTE — Progress Notes (Signed)
Subjective:  Patient ID: Cindy Graham, female    DOB: [birth year]  Age: 82 y.o. MRN: [id number]  CC: Cough  HPI:  82 year old female presents with complaints of cough.  Patient reports that she has had worsening cough for the past 2 weeks. Cough is nonproductive. She states that she has had this cough since January; I saw her for this at that time. She has been given 2 rounds of antibiotics by myself as well as her primary over the past few months. She states that the cough is been worsening and her nurse auscultated some abnormal lung sounds recently. No recent fever, chills. No reports of SOB. No other complaints today.  Social Hx   Social History   Social History  . Marital Status: Married    Spouse Name: N/A  . Number of Children: N/A  . Years of Education: N/A   Social History Main Topics  . Smoking status: Never Smoker   . Smokeless tobacco: Never Used  . Alcohol Use: No  . Drug Use: No  . Sexual Activity: Not Currently   Other Topics Concern  . None   Social History Narrative   Review of Systems  Constitutional: Negative for fever.  Respiratory: Positive for cough.    Objective:  BP 126/72 mmHg  Pulse 62  Temp(Src) 97.7 F (36.5 C) (Oral)  Ht 5\' 4"  (1.626 m)  Wt 182 lb (82.555 kg)  BMI 31.22 kg/m2  SpO2 93%  BP/Weight 09/17/2015 [id number]  Systolic BP 123XX123 [id number]  Diastolic BP 72 72 71  Wt. (Lbs) 182 185.25 185.25  BMI 31.22 31.78 36.18   Physical Exam  Constitutional: She appears well-developed. No distress.  Cardiovascular: Normal rate and regular rhythm.   Pulmonary/Chest: Effort normal.  Wheezing noted. Right basilar crackles noted as well.   Neurological: She is alert.  Psychiatric: She has a normal mood and affect.  Vitals reviewed.  Lab Results  Component Value Date   WBC 9.0 03/06/2015   HGB 12.8 03/06/2015   HCT 38.5 03/06/2015   PLT 340.0 03/06/2015   GLUCOSE 91 08/13/2015   CHOL 214* 07/16/2014   TRIG 207.0* 07/16/2014    HDL 48.80 07/16/2014   LDLDIRECT 135.0 07/16/2014   LDLCALC 150* 03/15/2013   ALT 13 08/13/2015   AST 22 08/13/2015   NA 138 08/13/2015   K 4.4 08/13/2015   CL 103 08/13/2015   CREATININE 1.28* 08/13/2015   BUN 26* 08/13/2015   CO2 26 08/13/2015   TSH 3.20 03/06/2015   HGBA1C 6.0 03/14/2013   MICROALBUR 0.7 01/15/2014   Assessment & Plan:   Problem List Items Addressed This Visit    Chronic cough - Primary    New problem. Patient now with chronic cough. Patient has developed chronic cough which has been worsening. Obtaining chest x-ray today. Treating with prednisone and Tussionex. Patient to follow-up with PCP will determine next course of action (likley referral to [facility]).      Relevant Orders   DG Chest 2 View     Meds ordered this encounter  Medications  . chlorpheniramine-HYDROcodone (TUSSIONEX PENNKINETIC ER) 10-8 MG/5ML SUER    Sig: Take 5 mLs by mouth every 12 (twelve) hours as needed.    Dispense:  115 mL    Refill:  0  . predniSONE (DELTASONE) 50 MG tablet    Sig: 1 tablet daily x 5 days.    Dispense:  5 tablet    Refill:  0   Follow-up:  PRN  [place]

## 2015-09-17 NOTE — Patient Instructions (Signed)
We will call with the results.  Take the prednisone as prescribed.  Use the cough medication at night.  Follow up closely with Dr. Gilford Rile.  Take care  Dr. Lacinda Axon

## 2015-09-17 NOTE — Assessment & Plan Note (Addendum)
New problem. Patient now with chronic cough. Patient has developed chronic cough which has been worsening. Obtaining chest x-ray today. Treating with prednisone and Tussionex. Patient to follow-up with PCP will determine next course of action (likley referral to [facility]).

## 2015-09-17 NOTE — Progress Notes (Signed)
Pre visit review using our clinic review tool, if applicable. No additional management support is needed unless otherwise documented below in the visit note. 

## 2015-09-18 ENCOUNTER — Telehealth: Payer: Self-pay

## 2015-09-18 ENCOUNTER — Telehealth: Payer: Self-pay | Admitting: Internal Medicine

## 2015-09-18 ENCOUNTER — Other Ambulatory Visit: Payer: Self-pay | Admitting: Family Medicine

## 2015-09-18 DIAGNOSIS — I739 Peripheral vascular disease, unspecified: Secondary | ICD-10-CM | POA: Diagnosis not present

## 2015-09-18 DIAGNOSIS — L989 Disorder of the skin and subcutaneous tissue, unspecified: Secondary | ICD-10-CM | POA: Diagnosis not present

## 2015-09-18 DIAGNOSIS — Z8701 Personal history of pneumonia (recurrent): Secondary | ICD-10-CM | POA: Diagnosis not present

## 2015-09-18 DIAGNOSIS — R32 Unspecified urinary incontinence: Secondary | ICD-10-CM | POA: Diagnosis not present

## 2015-09-18 DIAGNOSIS — R05 Cough: Secondary | ICD-10-CM

## 2015-09-18 DIAGNOSIS — R053 Chronic cough: Secondary | ICD-10-CM

## 2015-09-18 DIAGNOSIS — I110 Hypertensive heart disease with heart failure: Secondary | ICD-10-CM | POA: Diagnosis not present

## 2015-09-18 DIAGNOSIS — I509 Heart failure, unspecified: Secondary | ICD-10-CM | POA: Diagnosis not present

## 2015-09-18 MED ORDER — ALBUTEROL SULFATE HFA 108 (90 BASE) MCG/ACT IN AERS: 2.0000 | INHALATION_SPRAY | Freq: Four times a day (QID) | RESPIRATORY_TRACT | Status: DC | PRN

## 2015-09-18 NOTE — Telephone Encounter (Signed)
Pt called in about wanting to see if she can get an inhaler she states the cough syrup was not covered by her insurance? Pharmacy is [facility], [address] [place]. Call pt @ [phone]. Thank you!

## 2015-09-18 NOTE — Telephone Encounter (Signed)
Last OV 09/17/15.  Patient called to report the cough syrup was not covered by her insurance and requested an inhaler.  Notified inhaler has been sent to pharmacy.  She states she would like to move forward with a pulmonary referral.

## 2015-09-18 NOTE — Telephone Encounter (Signed)
Last OV 09/17/15 for chronic cough.  Inhaler sent to pharmacy per Dr. Lacinda Axon.

## 2015-09-23 DIAGNOSIS — I739 Peripheral vascular disease, unspecified: Secondary | ICD-10-CM | POA: Diagnosis not present

## 2015-09-23 DIAGNOSIS — I110 Hypertensive heart disease with heart failure: Secondary | ICD-10-CM | POA: Diagnosis not present

## 2015-09-23 DIAGNOSIS — L989 Disorder of the skin and subcutaneous tissue, unspecified: Secondary | ICD-10-CM | POA: Diagnosis not present

## 2015-09-23 DIAGNOSIS — Z8701 Personal history of pneumonia (recurrent): Secondary | ICD-10-CM | POA: Diagnosis not present

## 2015-09-23 DIAGNOSIS — I509 Heart failure, unspecified: Secondary | ICD-10-CM | POA: Diagnosis not present

## 2015-09-23 DIAGNOSIS — R32 Unspecified urinary incontinence: Secondary | ICD-10-CM | POA: Diagnosis not present

## 2015-09-24 ENCOUNTER — Telehealth: Payer: Self-pay | Admitting: Internal Medicine

## 2015-09-24 NOTE — Telephone Encounter (Signed)
Melandia, [facility] called. Please call her at [phone]. She wants to update Dr. Gilford Rile on pt.

## 2015-09-25 NOTE — Telephone Encounter (Signed)
Attempted to call Rip Harbour at [facility], left a VM to return my call.

## 2015-09-25 NOTE — Telephone Encounter (Signed)
Cindy Graham went to see patient yesterday for last visit.  Air flow was much better, fine crackles to lungs bilaterally , o2 was  92-98% (98 with deep breathing), BP was 140's 80's had been on high dose prednisone,  Yesterday was the last visit wanted her to continue therapy order, gave verbal.

## 2015-09-27 DIAGNOSIS — Z9981 Dependence on supplemental oxygen: Secondary | ICD-10-CM | POA: Diagnosis not present

## 2015-09-27 DIAGNOSIS — Z8501 Personal history of malignant neoplasm of esophagus: Secondary | ICD-10-CM | POA: Diagnosis not present

## 2015-09-27 DIAGNOSIS — I739 Peripheral vascular disease, unspecified: Secondary | ICD-10-CM | POA: Diagnosis not present

## 2015-09-27 DIAGNOSIS — J45909 Unspecified asthma, uncomplicated: Secondary | ICD-10-CM | POA: Diagnosis not present

## 2015-09-27 DIAGNOSIS — J209 Acute bronchitis, unspecified: Secondary | ICD-10-CM | POA: Diagnosis not present

## 2015-09-27 DIAGNOSIS — R32 Unspecified urinary incontinence: Secondary | ICD-10-CM | POA: Diagnosis not present

## 2015-09-27 DIAGNOSIS — M19019 Primary osteoarthritis, unspecified shoulder: Secondary | ICD-10-CM | POA: Diagnosis not present

## 2015-09-27 DIAGNOSIS — Z87440 Personal history of urinary (tract) infections: Secondary | ICD-10-CM | POA: Diagnosis not present

## 2015-09-27 DIAGNOSIS — I509 Heart failure, unspecified: Secondary | ICD-10-CM | POA: Diagnosis not present

## 2015-09-27 DIAGNOSIS — I110 Hypertensive heart disease with heart failure: Secondary | ICD-10-CM | POA: Diagnosis not present

## 2015-09-27 DIAGNOSIS — K219 Gastro-esophageal reflux disease without esophagitis: Secondary | ICD-10-CM | POA: Diagnosis not present

## 2015-09-27 DIAGNOSIS — Z7982 Long term (current) use of aspirin: Secondary | ICD-10-CM | POA: Diagnosis not present

## 2015-09-27 DIAGNOSIS — M2041 Other hammer toe(s) (acquired), right foot: Secondary | ICD-10-CM | POA: Diagnosis not present

## 2015-09-27 DIAGNOSIS — L989 Disorder of the skin and subcutaneous tissue, unspecified: Secondary | ICD-10-CM | POA: Diagnosis not present

## 2015-09-27 DIAGNOSIS — Z8701 Personal history of pneumonia (recurrent): Secondary | ICD-10-CM | POA: Diagnosis not present

## 2015-09-28 DIAGNOSIS — I739 Peripheral vascular disease, unspecified: Secondary | ICD-10-CM | POA: Diagnosis not present

## 2015-09-28 DIAGNOSIS — R32 Unspecified urinary incontinence: Secondary | ICD-10-CM | POA: Diagnosis not present

## 2015-09-28 DIAGNOSIS — L989 Disorder of the skin and subcutaneous tissue, unspecified: Secondary | ICD-10-CM | POA: Diagnosis not present

## 2015-09-28 DIAGNOSIS — I509 Heart failure, unspecified: Secondary | ICD-10-CM | POA: Diagnosis not present

## 2015-09-28 DIAGNOSIS — J209 Acute bronchitis, unspecified: Secondary | ICD-10-CM | POA: Diagnosis not present

## 2015-09-28 DIAGNOSIS — I110 Hypertensive heart disease with heart failure: Secondary | ICD-10-CM | POA: Diagnosis not present

## 2015-10-05 ENCOUNTER — Telehealth: Payer: Self-pay | Admitting: Internal Medicine

## 2015-10-05 DIAGNOSIS — I739 Peripheral vascular disease, unspecified: Secondary | ICD-10-CM | POA: Diagnosis not present

## 2015-10-05 DIAGNOSIS — R32 Unspecified urinary incontinence: Secondary | ICD-10-CM | POA: Diagnosis not present

## 2015-10-05 DIAGNOSIS — L989 Disorder of the skin and subcutaneous tissue, unspecified: Secondary | ICD-10-CM | POA: Diagnosis not present

## 2015-10-05 DIAGNOSIS — J209 Acute bronchitis, unspecified: Secondary | ICD-10-CM | POA: Diagnosis not present

## 2015-10-05 DIAGNOSIS — I110 Hypertensive heart disease with heart failure: Secondary | ICD-10-CM | POA: Diagnosis not present

## 2015-10-05 DIAGNOSIS — I509 Heart failure, unspecified: Secondary | ICD-10-CM | POA: Diagnosis not present

## 2015-10-05 NOTE — Telephone Encounter (Signed)
Please advise, thanks.

## 2015-10-05 NOTE — Telephone Encounter (Signed)
Cindy Graham [id number] [phone] called from [place] regarding pt is having burning with urination started feeling bad since yesterday. Pt is having trouble getting her thoughts together which is a sign from pt that  She's getting a UTI. With pt history do you want to order a antibiotic or does pt need to get a urine specimen? Verbal order message can be left on voicemail it is secured. [facility] [place], [place] was just seen two week ago per [name]. Thank you!

## 2015-10-05 NOTE — Telephone Encounter (Signed)
Spoke with Cindy Graham click, she will get the UA and culture.  thanks

## 2015-10-05 NOTE — Telephone Encounter (Signed)
Needs urinalysis and culture

## 2015-10-06 DIAGNOSIS — I509 Heart failure, unspecified: Secondary | ICD-10-CM | POA: Diagnosis not present

## 2015-10-06 DIAGNOSIS — R32 Unspecified urinary incontinence: Secondary | ICD-10-CM | POA: Diagnosis not present

## 2015-10-06 DIAGNOSIS — L989 Disorder of the skin and subcutaneous tissue, unspecified: Secondary | ICD-10-CM | POA: Diagnosis not present

## 2015-10-06 DIAGNOSIS — Z87440 Personal history of urinary (tract) infections: Secondary | ICD-10-CM | POA: Diagnosis not present

## 2015-10-06 DIAGNOSIS — I739 Peripheral vascular disease, unspecified: Secondary | ICD-10-CM | POA: Diagnosis not present

## 2015-10-06 DIAGNOSIS — R300 Dysuria: Secondary | ICD-10-CM | POA: Diagnosis not present

## 2015-10-06 DIAGNOSIS — J209 Acute bronchitis, unspecified: Secondary | ICD-10-CM | POA: Diagnosis not present

## 2015-10-06 DIAGNOSIS — I110 Hypertensive heart disease with heart failure: Secondary | ICD-10-CM | POA: Diagnosis not present

## 2015-10-09 ENCOUNTER — Encounter: Payer: Self-pay | Admitting: Internal Medicine

## 2015-10-09 ENCOUNTER — Ambulatory Visit (INDEPENDENT_AMBULATORY_CARE_PROVIDER_SITE_OTHER): Payer: Medicare Other | Admitting: Internal Medicine

## 2015-10-09 VITALS — BP 148/62 | HR 84 | Ht 63.0 in | Wt 182.0 lb

## 2015-10-09 DIAGNOSIS — J438 Other emphysema: Secondary | ICD-10-CM | POA: Diagnosis not present

## 2015-10-09 MED ORDER — UMECLIDINIUM-VILANTEROL 62.5-25 MCG/INH IN AEPB: 1.0000 | INHALATION_SPRAY | Freq: Every day | RESPIRATORY_TRACT | Status: DC

## 2015-10-09 NOTE — Telephone Encounter (Signed)
Spoke with the patient, the [facility] RN and then the pharmacy to rely that she needs the Cipro, all agreed. thanks

## 2015-10-09 NOTE — Telephone Encounter (Signed)
Cipro 250mg  po bid for 7 days, #14. Please call into pharmacy and make pt aware.

## 2015-10-09 NOTE — Progress Notes (Signed)
[facility] Consultation      Assessment and Plan:   Cough and dyspnea due to Asthmatic bronchitis.  -. Her cough is likely due to her recent episode of pneumonia/infection. The cough. Currently appears to be trailing off, after a longer than typical course. We will continue to monitor and treat symptomatically.  Chronic bronchitis/ possible Interstitial lung disease.  --Started on Anoro inhaler ,and demonstrated on its use, if this is not covered. We can start on a different inhaler. -The patient had bilateral respiratory crackles in the bases, likely due to mild interstitial lung disease. No further workup would be needed given her advanced age.   Date: 10/09/2015  MRN# [id number] Cindy Graham 07-22-1921  Referring Physician: Dr. Lacinda Axon.   Cindy Graham is a 82 y.o. old female seen in consultation for chief complaint of:    Chief Complaint  Patient presents with  . pulmonary consult    pt ref by dr.cook. pt c/o prod cough yellow in color, SOB w/activity, occ wheezing, occ CPX3-46mo    HPI:   The patient is a 82 year old female was referred for a chronic cough. She's been treated by her primary care physician with course of prednisone and antibiotics. She also has a history of chronic kidney disease.   She has been having an ongoing cough for about 3 months. The problem started about 3 months ago, she had pneumonia this past winter and the cough started with the pneumonia.  When she coughs she does not bring up anything. She feels that the cough is improving.  She has chronic dyspnea with exertion, she does not cook, she rarely leaves the home other than church and docs appts. She lives in an apt, she ambulates with a walker. She has a CNA who helps her bathe.   She takes an inhaler-- ventolin 2 puffs bid and feels that it helps. She had a diagnosis of asthma, she was on atrovent inhaler once daily and notes that it helped with wheezing. Insurance stopped  covering it about a year ago and she stopped it.   Review chest x-ray images from 09/17/2015 shows changes of chronic bronchitis, as well as an elevated anterior leaflet right diaphragm, this is similar to previous images from 06/01/13.   PMHX:   Past Medical History  Diagnosis Date  . Hypertension   . Hyperlipidemia   . Hernia   . Urine incontinence   . COPD (chronic obstructive pulmonary disease) ([place])   . Arthritis     osteo  . GERD (gastroesophageal reflux disease)   . Heart murmur   . Syncope and collapse   . Melanoma [facility])     face   Surgical Hx:  Past Surgical History  Procedure Laterality Date  . Abdominal hysterectomy    . Cholecystectomy    . Hernia repair     Family Hx:  Family History  Problem Relation Age of Onset  . Stroke Father   . Heart disease Sister   . Cancer Brother     colon  . Heart disease Brother   . Cancer Sister     colon   Social Hx:   Social History  Substance Use Topics  . Smoking status: Never Smoker   . Smokeless tobacco: Never Used  . Alcohol Use: No   Medication:   Current Outpatient Rx  Name  Route  Sig  Dispense  Refill  . albuterol (PROVENTIL HFA;VENTOLIN HFA) 108 (90 Base) MCG/ACT inhaler   Inhalation  Inhale 2 puffs into the lungs every 6 (six) hours as needed for wheezing or shortness of breath.   1 Inhaler   0   . aspirin 81 MG tablet   Oral   Take 81 mg by mouth daily.           Marland Kitchen atenolol (TENORMIN) 25 MG tablet      TAKE ONE (1) TABLET EACH DAY   90 tablet   0     Need to schedule a follow up appointment for June  ...   . Biotin 1000 MCG tablet   Oral   Take 1,000 mcg by mouth daily.           . chlorpheniramine-HYDROcodone (TUSSIONEX PENNKINETIC ER) 10-8 MG/5ML SUER   Oral   Take 5 mLs by mouth every 12 (twelve) hours as needed.   115 mL   0   . cholecalciferol (VITAMIN D) 400 UNITS TABS   Oral   Take 400 Units by mouth 2 (two) times daily.           . enalapril (VASOTEC) 20 MG tablet       TAKE ONE (1) TABLET BY MOUTH EVERY DAY   90 tablet   1     IF OKAY, COULD PATIENT HAVE A 3 MONTH SUPPLY TO SY ...   . Flaxseed, Linseed, (FLAX SEED OIL) 1000 MG CAPS   Oral   Take 1 capsule by mouth daily.           Marland Kitchen gabapentin (NEURONTIN) 300 MG capsule      TAKE ONE (1) CAPSULE EACH DAY   90 capsule   1   . Multiple Vitamin (MULTIVITAMIN) capsule   Oral   Take 1 capsule by mouth daily.           Marland Kitchen NITROSTAT 0.3 MG SL tablet      DISSOLVE 1 TABLET UNDER TONGUE EVERY 5 MINUTES FOR 3 DOSES AS NEEDED FOR CHEST PAIN. IF NO RELIEF GO TO ER.   100 tablet   1   . NUCYNTA 50 MG TABS tablet   Oral   Take 50 mg by mouth 2 (two) times daily as needed (Take 1 tablet twice a day as needed).          . nystatin (MYCOSTATIN/NYSTOP) 100000 UNIT/GM POWD      Apply 1 application topically BID to affected area until rash resolves   30 g   1   . nystatin cream (MYCOSTATIN)   Topical   Apply 1 application topically 2 (two) times daily.   30 g   0   . omeprazole (PRILOSEC) 20 MG capsule      TAKE ONE CAPSULE BY MOUTH DAILY   90 capsule   1   . oxybutynin (DITROPAN) 5 MG tablet      TAKE ONE (1) TABLET BY MOUTH EVERY DAY   30 tablet   2   . predniSONE (DELTASONE) 50 MG tablet      1 tablet daily x 5 days.   5 tablet   0   . sertraline (ZOLOFT) 100 MG tablet      TAKE ONE (1) TABLET EACH DAY   90 tablet   1   . triamterene-hydrochlorothiazide (DYAZIDE) 37.5-25 MG capsule      TAKE ONE (1) CAPSULE EACH DAY   90 capsule   1   . verapamil (CALAN-SR) 180 MG CR tablet      TAKE ONE TABLET BY MOUTH EVERY NIGHT  AT BEDTIME   90 tablet   1       Allergies:  Statins; Tramadol; Neosporin; Penicillins cross reactors; Sulfa antibiotics; and Tetracyclines & related  Review of Systems: Gen:  Denies  fever, sweats, chills HEENT: Denies blurred vision, double vision.  Cvc:  No dizziness, chest pain. Resp:   Denies cough or sputum production,  Gi: Denies  swallowing difficulty, stomach pain. Gu:  Denies bladder incontinence, burning urine Ext:   No Joint pain, stiffness. Skin: No skin rash,  hives  Endoc:  No polyuria, polydipsia. Psych: No depression, insomnia. Other:  All other systems were reviewed with the patient and were negative other that what is mentioned in the HPI.   Physical Examination:   VS: BP 148/62 mmHg  Pulse 84  Ht 5\' 3"  (1.6 m)  Wt 182 lb (82.555 kg)  BMI 32.25 kg/m2  SpO2 91%  General Appearance: No distress  Neuro:without focal findings,  speech normal,  HEENT: PERRLA, EOM intact.   Pulmonary: normal breath sounds, No wheezing.  CardiovascularNormal S1,S2.  No m/r/g.   Abdomen: Benign, Soft, non-tender. Renal:  No costovertebral tenderness  GU:  No performed at this time. Endoc: No evident thyromegaly, no signs of acromegaly. Skin:   warm, no rashes, no ecchymosis  Extremities: normal, no cyanosis, clubbing.  Other findings:    LABORATORY PANEL:   CBC No results for input(s): WBC, HGB, HCT, PLT in the last 168 hours. ------------------------------------------------------------------------------------------------------------------  Chemistries  No results for input(s): NA, K, CL, CO2, GLUCOSE, BUN, CREATININE, CALCIUM, MG, AST, ALT, ALKPHOS, BILITOT in the last 168 hours.  Invalid input(s): GFRCGP ------------------------------------------------------------------------------------------------------------------  Cardiac Enzymes No results for input(s): TROPONINI in the last 168 hours. ------------------------------------------------------------  RADIOLOGY:  No results found.     Thank  you for the consultation and for allowing [facility] to assist in the care of your patient. Our recommendations are noted above.  Please contact us if we can be of further service.   Marda Stalker, MD.  Board Certified in Internal Medicine, Pulmonary Medicine, [place], and Sleep Medicine.  [facility] Office Number: [phone]  Patricia Pesa, M.D.  Vilinda Boehringer, M.D.  Merton Border, M.D  10/09/2015

## 2015-10-09 NOTE — Telephone Encounter (Signed)
Results give to Dr. Gilford Rile

## 2015-10-09 NOTE — Addendum Note (Signed)
Addended by: Maryanna Shape A on: 10/09/2015 09:53 AM   Modules accepted: Orders, Medications

## 2015-10-09 NOTE — Patient Instructions (Signed)
Anoro 1 puff daily.

## 2015-10-09 NOTE — Telephone Encounter (Signed)
Lab results faxed over from [facility] . The patient is needing an antibiotic called in before the weekend. Lab results given to triage nurse.

## 2015-10-12 DIAGNOSIS — I509 Heart failure, unspecified: Secondary | ICD-10-CM | POA: Diagnosis not present

## 2015-10-12 DIAGNOSIS — R32 Unspecified urinary incontinence: Secondary | ICD-10-CM | POA: Diagnosis not present

## 2015-10-12 DIAGNOSIS — I110 Hypertensive heart disease with heart failure: Secondary | ICD-10-CM | POA: Diagnosis not present

## 2015-10-12 DIAGNOSIS — L989 Disorder of the skin and subcutaneous tissue, unspecified: Secondary | ICD-10-CM | POA: Diagnosis not present

## 2015-10-12 DIAGNOSIS — I739 Peripheral vascular disease, unspecified: Secondary | ICD-10-CM | POA: Diagnosis not present

## 2015-10-12 DIAGNOSIS — J209 Acute bronchitis, unspecified: Secondary | ICD-10-CM | POA: Diagnosis not present

## 2015-10-15 DIAGNOSIS — I110 Hypertensive heart disease with heart failure: Secondary | ICD-10-CM | POA: Diagnosis not present

## 2015-10-15 DIAGNOSIS — I509 Heart failure, unspecified: Secondary | ICD-10-CM | POA: Diagnosis not present

## 2015-10-15 DIAGNOSIS — L989 Disorder of the skin and subcutaneous tissue, unspecified: Secondary | ICD-10-CM | POA: Diagnosis not present

## 2015-10-15 DIAGNOSIS — R32 Unspecified urinary incontinence: Secondary | ICD-10-CM | POA: Diagnosis not present

## 2015-10-15 DIAGNOSIS — J209 Acute bronchitis, unspecified: Secondary | ICD-10-CM | POA: Diagnosis not present

## 2015-10-15 DIAGNOSIS — I739 Peripheral vascular disease, unspecified: Secondary | ICD-10-CM | POA: Diagnosis not present

## 2015-10-20 DIAGNOSIS — L989 Disorder of the skin and subcutaneous tissue, unspecified: Secondary | ICD-10-CM | POA: Diagnosis not present

## 2015-10-20 DIAGNOSIS — I110 Hypertensive heart disease with heart failure: Secondary | ICD-10-CM | POA: Diagnosis not present

## 2015-10-20 DIAGNOSIS — I739 Peripheral vascular disease, unspecified: Secondary | ICD-10-CM | POA: Diagnosis not present

## 2015-10-20 DIAGNOSIS — I509 Heart failure, unspecified: Secondary | ICD-10-CM | POA: Diagnosis not present

## 2015-10-20 DIAGNOSIS — R32 Unspecified urinary incontinence: Secondary | ICD-10-CM | POA: Diagnosis not present

## 2015-10-20 DIAGNOSIS — J209 Acute bronchitis, unspecified: Secondary | ICD-10-CM | POA: Diagnosis not present

## 2015-10-26 ENCOUNTER — Telehealth: Payer: Self-pay | Admitting: *Deleted

## 2015-10-26 DIAGNOSIS — I739 Peripheral vascular disease, unspecified: Secondary | ICD-10-CM | POA: Diagnosis not present

## 2015-10-26 DIAGNOSIS — J209 Acute bronchitis, unspecified: Secondary | ICD-10-CM | POA: Diagnosis not present

## 2015-10-26 DIAGNOSIS — L989 Disorder of the skin and subcutaneous tissue, unspecified: Secondary | ICD-10-CM | POA: Diagnosis not present

## 2015-10-26 DIAGNOSIS — I509 Heart failure, unspecified: Secondary | ICD-10-CM | POA: Diagnosis not present

## 2015-10-26 DIAGNOSIS — R32 Unspecified urinary incontinence: Secondary | ICD-10-CM | POA: Diagnosis not present

## 2015-10-26 DIAGNOSIS — I110 Hypertensive heart disease with heart failure: Secondary | ICD-10-CM | POA: Diagnosis not present

## 2015-10-26 NOTE — Telephone Encounter (Signed)
Can this order be placed?

## 2015-10-26 NOTE — Telephone Encounter (Signed)
Yes, fine to call in verbal order for CXR

## 2015-10-26 NOTE — Telephone Encounter (Signed)
Linda from [facility] call to report : Increase fatigue, with new cough,wet sounding, but not coughing up anything. Rales in both lung bases,that she blew out green material this morning with no fever,oxygen level drops with ambulation 90-91,resolve quickly.Can a mobile chest Xray be ordered, due to the new left lung Rales.  [name] [phone]

## 2015-10-26 NOTE — Telephone Encounter (Signed)
A verbal order was given per Dr.Walkers request.

## 2015-10-27 DIAGNOSIS — R05 Cough: Secondary | ICD-10-CM | POA: Diagnosis not present

## 2015-10-28 ENCOUNTER — Telehealth: Payer: Self-pay | Admitting: Internal Medicine

## 2015-10-28 NOTE — Telephone Encounter (Signed)
Pt called wanting results of her xray from 6/6

## 2015-10-28 NOTE — Telephone Encounter (Signed)
Patient was informed of results.  Patient understood and no questions, comments, or concerns at this time.  

## 2015-10-28 NOTE — Telephone Encounter (Signed)
Chest xray showed no acute process.

## 2015-10-28 NOTE — Telephone Encounter (Signed)
Mobile chest xray was normal with no acute findings.

## 2015-11-03 DIAGNOSIS — R32 Unspecified urinary incontinence: Secondary | ICD-10-CM | POA: Diagnosis not present

## 2015-11-03 DIAGNOSIS — I509 Heart failure, unspecified: Secondary | ICD-10-CM | POA: Diagnosis not present

## 2015-11-03 DIAGNOSIS — I739 Peripheral vascular disease, unspecified: Secondary | ICD-10-CM | POA: Diagnosis not present

## 2015-11-03 DIAGNOSIS — I110 Hypertensive heart disease with heart failure: Secondary | ICD-10-CM | POA: Diagnosis not present

## 2015-11-03 DIAGNOSIS — J209 Acute bronchitis, unspecified: Secondary | ICD-10-CM | POA: Diagnosis not present

## 2015-11-03 DIAGNOSIS — L989 Disorder of the skin and subcutaneous tissue, unspecified: Secondary | ICD-10-CM | POA: Diagnosis not present

## 2015-11-05 ENCOUNTER — Other Ambulatory Visit: Payer: Self-pay | Admitting: Internal Medicine

## 2015-11-09 DIAGNOSIS — I110 Hypertensive heart disease with heart failure: Secondary | ICD-10-CM | POA: Diagnosis not present

## 2015-11-09 DIAGNOSIS — I509 Heart failure, unspecified: Secondary | ICD-10-CM | POA: Diagnosis not present

## 2015-11-09 DIAGNOSIS — I739 Peripheral vascular disease, unspecified: Secondary | ICD-10-CM | POA: Diagnosis not present

## 2015-11-09 DIAGNOSIS — R32 Unspecified urinary incontinence: Secondary | ICD-10-CM | POA: Diagnosis not present

## 2015-11-09 DIAGNOSIS — J209 Acute bronchitis, unspecified: Secondary | ICD-10-CM | POA: Diagnosis not present

## 2015-11-09 DIAGNOSIS — L989 Disorder of the skin and subcutaneous tissue, unspecified: Secondary | ICD-10-CM | POA: Diagnosis not present

## 2015-11-16 DIAGNOSIS — I110 Hypertensive heart disease with heart failure: Secondary | ICD-10-CM | POA: Diagnosis not present

## 2015-11-16 DIAGNOSIS — I509 Heart failure, unspecified: Secondary | ICD-10-CM | POA: Diagnosis not present

## 2015-11-16 DIAGNOSIS — I739 Peripheral vascular disease, unspecified: Secondary | ICD-10-CM | POA: Diagnosis not present

## 2015-11-16 DIAGNOSIS — R32 Unspecified urinary incontinence: Secondary | ICD-10-CM | POA: Diagnosis not present

## 2015-11-16 DIAGNOSIS — J209 Acute bronchitis, unspecified: Secondary | ICD-10-CM | POA: Diagnosis not present

## 2015-11-16 DIAGNOSIS — L989 Disorder of the skin and subcutaneous tissue, unspecified: Secondary | ICD-10-CM | POA: Diagnosis not present

## 2015-11-20 ENCOUNTER — Telehealth: Payer: Self-pay | Admitting: Internal Medicine

## 2015-11-20 NOTE — Telephone Encounter (Signed)
OK to call in verbal order?

## 2015-11-20 NOTE — Telephone Encounter (Signed)
Cindy Graham [id number] [phone] called from [facility] regarding she needs a verbal ok to continue home health nursing.   It is ok to verbal on vm. Thank you!

## 2015-11-20 NOTE — Telephone Encounter (Signed)
Verbal given 

## 2015-11-20 NOTE — Telephone Encounter (Signed)
Fine to call in verbal order to continue home health

## 2015-11-24 DIAGNOSIS — I739 Peripheral vascular disease, unspecified: Secondary | ICD-10-CM | POA: Diagnosis not present

## 2015-11-24 DIAGNOSIS — L989 Disorder of the skin and subcutaneous tissue, unspecified: Secondary | ICD-10-CM | POA: Diagnosis not present

## 2015-11-24 DIAGNOSIS — I110 Hypertensive heart disease with heart failure: Secondary | ICD-10-CM | POA: Diagnosis not present

## 2015-11-24 DIAGNOSIS — J209 Acute bronchitis, unspecified: Secondary | ICD-10-CM | POA: Diagnosis not present

## 2015-11-24 DIAGNOSIS — I509 Heart failure, unspecified: Secondary | ICD-10-CM | POA: Diagnosis not present

## 2015-11-24 DIAGNOSIS — R32 Unspecified urinary incontinence: Secondary | ICD-10-CM | POA: Diagnosis not present

## 2015-11-25 ENCOUNTER — Telehealth: Payer: Self-pay | Admitting: *Deleted

## 2015-11-25 DIAGNOSIS — J209 Acute bronchitis, unspecified: Secondary | ICD-10-CM | POA: Diagnosis not present

## 2015-11-25 DIAGNOSIS — R300 Dysuria: Secondary | ICD-10-CM | POA: Diagnosis not present

## 2015-11-25 DIAGNOSIS — R32 Unspecified urinary incontinence: Secondary | ICD-10-CM | POA: Diagnosis not present

## 2015-11-25 DIAGNOSIS — I509 Heart failure, unspecified: Secondary | ICD-10-CM | POA: Diagnosis not present

## 2015-11-25 DIAGNOSIS — I739 Peripheral vascular disease, unspecified: Secondary | ICD-10-CM | POA: Diagnosis not present

## 2015-11-25 DIAGNOSIS — L989 Disorder of the skin and subcutaneous tissue, unspecified: Secondary | ICD-10-CM | POA: Diagnosis not present

## 2015-11-25 DIAGNOSIS — Z87440 Personal history of urinary (tract) infections: Secondary | ICD-10-CM | POA: Diagnosis not present

## 2015-11-25 DIAGNOSIS — I110 Hypertensive heart disease with heart failure: Secondary | ICD-10-CM | POA: Diagnosis not present

## 2015-11-25 NOTE — Telephone Encounter (Signed)
Fine to check UA and culture

## 2015-11-25 NOTE — Telephone Encounter (Signed)
Please advise thanks.

## 2015-11-25 NOTE — Telephone Encounter (Signed)
Linda Click(nurse) [facility] stated that pt has a foul order to her urine, and would like to run a urinalysis/CNS with the approval from Dr. Gilford Rile. [name] also stated that patient has a dry hacking cough for weeks, she can hear crackles in the bilateral bases,no decrease in oxygen levels.  [facility]       [phone]

## 2015-11-25 NOTE — Telephone Encounter (Signed)
Spoke with [name] at [facility].  Gave Verbal for UA/Culture

## 2015-11-26 ENCOUNTER — Ambulatory Visit (INDEPENDENT_AMBULATORY_CARE_PROVIDER_SITE_OTHER): Payer: Medicare Other | Admitting: Internal Medicine

## 2015-11-26 ENCOUNTER — Ambulatory Visit (INDEPENDENT_AMBULATORY_CARE_PROVIDER_SITE_OTHER): Payer: Medicare Other

## 2015-11-26 ENCOUNTER — Encounter: Payer: Self-pay | Admitting: Internal Medicine

## 2015-11-26 VITALS — BP 130/50 | HR 64 | Wt 182.6 lb

## 2015-11-26 DIAGNOSIS — R05 Cough: Secondary | ICD-10-CM

## 2015-11-26 DIAGNOSIS — N183 Chronic kidney disease, stage 3 unspecified: Secondary | ICD-10-CM

## 2015-11-26 DIAGNOSIS — I10 Essential (primary) hypertension: Secondary | ICD-10-CM | POA: Diagnosis not present

## 2015-11-26 DIAGNOSIS — Z9981 Dependence on supplemental oxygen: Secondary | ICD-10-CM | POA: Diagnosis not present

## 2015-11-26 DIAGNOSIS — Z8701 Personal history of pneumonia (recurrent): Secondary | ICD-10-CM | POA: Diagnosis not present

## 2015-11-26 DIAGNOSIS — K219 Gastro-esophageal reflux disease without esophagitis: Secondary | ICD-10-CM | POA: Diagnosis not present

## 2015-11-26 DIAGNOSIS — I110 Hypertensive heart disease with heart failure: Secondary | ICD-10-CM | POA: Diagnosis not present

## 2015-11-26 DIAGNOSIS — Z87440 Personal history of urinary (tract) infections: Secondary | ICD-10-CM | POA: Diagnosis not present

## 2015-11-26 DIAGNOSIS — R32 Unspecified urinary incontinence: Secondary | ICD-10-CM | POA: Diagnosis not present

## 2015-11-26 DIAGNOSIS — Z7982 Long term (current) use of aspirin: Secondary | ICD-10-CM | POA: Diagnosis not present

## 2015-11-26 DIAGNOSIS — M2041 Other hammer toe(s) (acquired), right foot: Secondary | ICD-10-CM | POA: Diagnosis not present

## 2015-11-26 DIAGNOSIS — R053 Chronic cough: Secondary | ICD-10-CM

## 2015-11-26 DIAGNOSIS — I509 Heart failure, unspecified: Secondary | ICD-10-CM | POA: Diagnosis not present

## 2015-11-26 DIAGNOSIS — J45909 Unspecified asthma, uncomplicated: Secondary | ICD-10-CM | POA: Diagnosis not present

## 2015-11-26 DIAGNOSIS — Z8501 Personal history of malignant neoplasm of esophagus: Secondary | ICD-10-CM | POA: Diagnosis not present

## 2015-11-26 DIAGNOSIS — I739 Peripheral vascular disease, unspecified: Secondary | ICD-10-CM | POA: Diagnosis not present

## 2015-11-26 DIAGNOSIS — M19019 Primary osteoarthritis, unspecified shoulder: Secondary | ICD-10-CM | POA: Diagnosis not present

## 2015-11-26 LAB — CBC WITH DIFFERENTIAL/PLATELET
BASOS PCT: 0.6 % (ref 0.0–3.0)
Basophils Absolute: 0 10*3/uL (ref 0.0–0.1)
EOS PCT: 8.9 % — AB (ref 0.0–5.0)
Eosinophils Absolute: 0.7 10*3/uL (ref 0.0–0.7)
HCT: 37.8 % (ref 36.0–46.0)
Hemoglobin: 12.3 g/dL (ref 12.0–15.0)
LYMPHS ABS: 2.6 10*3/uL (ref 0.7–4.0)
Lymphocytes Relative: 35.3 % (ref 12.0–46.0)
MCHC: 32.5 g/dL (ref 30.0–36.0)
MCV: 94.3 fl (ref 78.0–100.0)
MONO ABS: 0.8 10*3/uL (ref 0.1–1.0)
MONOS PCT: 10.3 % (ref 3.0–12.0)
NEUTROS ABS: 3.3 10*3/uL (ref 1.4–7.7)
NEUTROS PCT: 44.9 % (ref 43.0–77.0)
PLATELETS: 279 10*3/uL (ref 150.0–400.0)
RBC: 4.01 Mil/uL (ref 3.87–5.11)
RDW: 15.2 % (ref 11.5–15.5)
WBC: 7.4 10*3/uL (ref 4.0–10.5)

## 2015-11-26 LAB — COMPREHENSIVE METABOLIC PANEL
ALK PHOS: 71 U/L (ref 39–117)
ALT: 12 U/L (ref 0–35)
AST: 23 U/L (ref 0–37)
Albumin: 3.9 g/dL (ref 3.5–5.2)
BUN: 26 mg/dL — AB (ref 6–23)
CHLORIDE: 102 meq/L (ref 96–112)
CO2: 28 meq/L (ref 19–32)
Calcium: 9.9 mg/dL (ref 8.4–10.5)
Creatinine, Ser: 1.15 mg/dL (ref 0.40–1.20)
GFR: 46.87 mL/min — AB (ref >60.00)
GLUCOSE: 95 mg/dL (ref 70–99)
POTASSIUM: 4.6 meq/L (ref 3.5–5.1)
SODIUM: 137 meq/L (ref 135–145)
TOTAL PROTEIN: 7 g/dL (ref 6.0–8.3)
Total Bilirubin: 0.4 mg/dL (ref 0.2–1.2)

## 2015-11-26 NOTE — Assessment & Plan Note (Signed)
Chronic problem, however worsening over last 1-2 weeks. Few scattered rhonchi noted on exam. Will repeat CXR today. Will also check CBC with labs today. Reviewed notes from pulmonology. Continue Anora for now with prn Albuterol.

## 2015-11-26 NOTE — Progress Notes (Signed)
Subjective:    Patient ID: Cindy Graham, female    DOB: Oct 02, 1921, 82 y.o.   MRN: [id number]  HPI  82YO female presents for follow up.  Cough - Seems worse over last few days. No fever or chills. Cough is non-productive. Started on Anoro, no improvement. Mild dyspnea with exertion unchanged.  No other new concerns today. Compliant with medications.    Wt Readings from Last 3 Encounters:  11/26/15 182 lb 9.6 oz (82.827 kg)  10/09/15 182 lb (82.555 kg)  09/17/15 182 lb (82.555 kg)   BP Readings from Last 3 Encounters:  11/26/15 130/50  10/09/15 148/62  09/17/15 126/72    Past Medical History  Diagnosis Date  . Hypertension   . Hyperlipidemia   . Hernia   . Urine incontinence   . COPD (chronic obstructive pulmonary disease) ([place])   . Arthritis     osteo  . GERD (gastroesophageal reflux disease)   . Heart murmur   . Syncope and collapse   . Melanoma ([place])     face   Family History  Problem Relation Age of Onset  . Stroke Father   . Heart disease Sister   . Cancer Brother     colon  . Heart disease Brother   . Cancer Sister     colon   Past Surgical History  Procedure Laterality Date  . Abdominal hysterectomy    . Cholecystectomy    . Hernia repair     Social History   Social History  . Marital Status: Married    Spouse Name: N/A  . Number of Children: N/A  . Years of Education: N/A   Social History Main Topics  . Smoking status: Never Smoker   . Smokeless tobacco: Never Used  . Alcohol Use: No  . Drug Use: No  . Sexual Activity: Not Currently   Other Topics Concern  . Not on file   Social History Narrative    Review of Systems  Constitutional: Negative for fever, chills, appetite change, fatigue and unexpected weight change.  HENT: Negative for congestion, postnasal drip, rhinorrhea, sinus pressure, sneezing, sore throat, trouble swallowing and voice change.   Eyes: Negative for visual disturbance.  Respiratory: Positive for cough and  shortness of breath. Negative for chest tightness and wheezing.   Cardiovascular: Negative for chest pain, palpitations and leg swelling.  Gastrointestinal: Negative for abdominal pain.  Musculoskeletal: Positive for myalgias, back pain, arthralgias and gait problem.  Skin: Negative for color change and rash.  Hematological: Negative for adenopathy. Does not bruise/bleed easily.  Psychiatric/Behavioral: Negative for sleep disturbance and dysphoric mood. The patient is not nervous/anxious.        Objective:    BP 130/50 mmHg  Pulse 64  Wt 182 lb 9.6 oz (82.827 kg)  SpO2 96% Physical Exam  Constitutional: She is oriented to person, place, and time. She appears well-developed and well-nourished. No distress.  HENT:  Head: Normocephalic and atraumatic.  Right Ear: External ear normal.  Left Ear: External ear normal.  Nose: Nose normal.  Mouth/Throat: Oropharynx is clear and moist. No oropharyngeal exudate.  Eyes: Conjunctivae are normal. Pupils are equal, round, and reactive to light. Right eye exhibits no discharge. Left eye exhibits no discharge. No scleral icterus.  Neck: Normal range of motion. Neck supple. No tracheal deviation present. No thyromegaly present.  Cardiovascular: Normal rate, regular rhythm, normal heart sounds and intact distal pulses.  Exam reveals no gallop and no friction rub.   No murmur  heard. Pulmonary/Chest: Effort normal. No accessory muscle usage. No tachypnea. No respiratory distress. She has no decreased breath sounds. She has no wheezes. She has rhonchi (rare at bilateral bases). She has no rales. She exhibits no tenderness.  Musculoskeletal: Normal range of motion. She exhibits no edema or tenderness.  Lymphadenopathy:    She has no cervical adenopathy.  Neurological: She is alert and oriented to person, place, and time. No cranial nerve deficit. She exhibits normal muscle tone. Coordination normal.  Skin: Skin is warm and dry. No rash noted. She is not  diaphoretic. No erythema. No pallor.  Psychiatric: She has a normal mood and affect. Her behavior is normal. Judgment and thought content normal.          Assessment & Plan:   Problem List Items Addressed This Visit      Unprioritized   Chronic cough    Chronic problem, however worsening over last 1-2 weeks. Few scattered rhonchi noted on exam. Will repeat CXR today. Will also check CBC with labs today. Reviewed notes from pulmonology. Continue Anora for now with prn Albuterol.      Relevant Orders   CBC with Differential/Platelet   DG Chest 2 View   Chronic kidney disease (CKD), stage III (moderate) (Chronic)    Will check renal function with labs.      Relevant Orders   Comprehensive metabolic panel   Hypertension - Primary (Chronic)    BP Readings from Last 3 Encounters:  11/26/15 130/50  10/09/15 148/62  09/17/15 126/72   BP well controlled. Renal function with labs. Continue current medications.      Relevant Orders   Comprehensive metabolic panel       Return in about 4 weeks (around 12/24/2015) for New Patient.  Ronette Deter, MD Internal Medicine [facility]

## 2015-11-26 NOTE — Patient Instructions (Signed)
Labs and chest x-ray today.

## 2015-11-26 NOTE — Assessment & Plan Note (Signed)
Will check renal function with labs. 

## 2015-11-26 NOTE — Progress Notes (Signed)
Pre visit review using our clinic review tool, if applicable. No additional management support is needed unless otherwise documented below in the visit note. 

## 2015-11-26 NOTE — Assessment & Plan Note (Signed)
BP Readings from Last 3 Encounters:  11/26/15 130/50  10/09/15 148/62  09/17/15 126/72   BP well controlled. Renal function with labs. Continue current medications.

## 2015-11-27 ENCOUNTER — Telehealth: Payer: Self-pay | Admitting: Internal Medicine

## 2015-11-27 DIAGNOSIS — I739 Peripheral vascular disease, unspecified: Secondary | ICD-10-CM | POA: Diagnosis not present

## 2015-11-27 DIAGNOSIS — Z87440 Personal history of urinary (tract) infections: Secondary | ICD-10-CM | POA: Diagnosis not present

## 2015-11-27 DIAGNOSIS — J45909 Unspecified asthma, uncomplicated: Secondary | ICD-10-CM | POA: Diagnosis not present

## 2015-11-27 DIAGNOSIS — I509 Heart failure, unspecified: Secondary | ICD-10-CM | POA: Diagnosis not present

## 2015-11-27 DIAGNOSIS — R32 Unspecified urinary incontinence: Secondary | ICD-10-CM | POA: Diagnosis not present

## 2015-11-27 DIAGNOSIS — I110 Hypertensive heart disease with heart failure: Secondary | ICD-10-CM | POA: Diagnosis not present

## 2015-11-27 NOTE — Telephone Encounter (Signed)
Can you let her know that urine culture was negative.

## 2015-11-27 NOTE — Telephone Encounter (Signed)
Patient has been informed and had no questions or concerns at this time.

## 2015-11-30 ENCOUNTER — Other Ambulatory Visit: Payer: Self-pay | Admitting: Internal Medicine

## 2015-12-02 ENCOUNTER — Telehealth: Payer: Self-pay | Admitting: *Deleted

## 2015-12-02 DIAGNOSIS — I509 Heart failure, unspecified: Secondary | ICD-10-CM | POA: Diagnosis not present

## 2015-12-02 DIAGNOSIS — R32 Unspecified urinary incontinence: Secondary | ICD-10-CM | POA: Diagnosis not present

## 2015-12-02 DIAGNOSIS — Z87440 Personal history of urinary (tract) infections: Secondary | ICD-10-CM | POA: Diagnosis not present

## 2015-12-02 DIAGNOSIS — J45909 Unspecified asthma, uncomplicated: Secondary | ICD-10-CM | POA: Diagnosis not present

## 2015-12-02 DIAGNOSIS — I739 Peripheral vascular disease, unspecified: Secondary | ICD-10-CM | POA: Diagnosis not present

## 2015-12-02 DIAGNOSIS — I110 Hypertensive heart disease with heart failure: Secondary | ICD-10-CM | POA: Diagnosis not present

## 2015-12-02 NOTE — Telephone Encounter (Signed)
Pt. Scheduled for 07/13/ with Dr. Lacinda Axon

## 2015-12-02 NOTE — Telephone Encounter (Signed)
Number provided for [name] is not correct, please advise, thanks

## 2015-12-02 NOTE — Telephone Encounter (Addendum)
Patient has a scheduled appt on 12/24/15, will a sooner appt be needed

## 2015-12-02 NOTE — Telephone Encounter (Signed)
Needs to be seen if any changes. Her recent CXR was normal.

## 2015-12-02 NOTE — Telephone Encounter (Signed)
thanks

## 2015-12-02 NOTE — Telephone Encounter (Signed)
Spoke with [name], reviewed providers note.  Patient needs an appt with provider.  Please advise and schedule patient, thanks

## 2015-12-02 NOTE — Telephone Encounter (Signed)
C[facility] from [facility] stated that pt still has a cough since last office visit. On Saturday patient started a OTC adult tussin, she currently coughs up green materials. Her lungs sounds the same, since last visit. He oxygen lever is 90% resting at room air, drops to 92% with ambulatory up to 20-30 feet,recovers rapidly. Would Dr. Gilford Rile like to make any changes [facility] contact [phone]

## 2015-12-02 NOTE — Telephone Encounter (Signed)
If her symptoms get worse yes. thanks

## 2015-12-02 NOTE — Telephone Encounter (Signed)
Please advise any changes, thanks

## 2015-12-02 NOTE — Telephone Encounter (Signed)
Correction [phone]

## 2015-12-03 ENCOUNTER — Encounter: Payer: Self-pay | Admitting: Family Medicine

## 2015-12-03 ENCOUNTER — Ambulatory Visit (INDEPENDENT_AMBULATORY_CARE_PROVIDER_SITE_OTHER): Payer: Medicare Other | Admitting: Family Medicine

## 2015-12-03 VITALS — BP 144/62 | HR 69 | Temp 97.5°F | Wt 183.4 lb

## 2015-12-03 DIAGNOSIS — R05 Cough: Secondary | ICD-10-CM | POA: Diagnosis not present

## 2015-12-03 DIAGNOSIS — R053 Chronic cough: Secondary | ICD-10-CM

## 2015-12-03 MED ORDER — MOMETASONE FURO-FORMOTEROL FUM 200-5 MCG/ACT IN AERO: 2.0000 | INHALATION_SPRAY | Freq: Two times a day (BID) | RESPIRATORY_TRACT | Status: DC

## 2015-12-03 NOTE — Progress Notes (Signed)
Pre visit review using our clinic review tool, if applicable. No additional management support is needed unless otherwise documented below in the visit note. 

## 2015-12-03 NOTE — Patient Instructions (Signed)
Stop the Anoro.  Start the [place].  Follow up closely me.  Take care  Dr. Lacinda Axon

## 2015-12-03 NOTE — Assessment & Plan Note (Signed)
Established problem, worsening. Switching to [facility]. May need follow with pulmonology. We'll monitor closely. No need for imaging as she just had a chest x-ray 6 days ago.

## 2015-12-03 NOTE — Progress Notes (Signed)
   Subjective:  Patient ID: Cindy Graham, female    DOB: Jul 01, 1921  Age: 82 y.o. MRN: [id number]  CC: Cough (chronic)  HPI:  82 year old female with a history of chronic cough likely secondary to chronic bronchitis/interstitial lung disease presents with continued complaints of cough.  Patient continues to complain of cough. She states is mildly productive as of late. She states it is productive of green sputum. She was recently seen on 7/6. Chest x-ray was obtained and was negative. She states that she's had minimal improvement with the Anoro. No known exacerbating factors. She does have some shortness of breath. No recent fever or chills. No other complaints at this time.  Social Hx   Social History   Social History  . Marital Status: Married    Spouse Name: N/A  . Number of Children: N/A  . Years of Education: N/A   Social History Main Topics  . Smoking status: Never Smoker   . Smokeless tobacco: Never Used  . Alcohol Use: No  . Drug Use: No  . Sexual Activity: Not Currently   Other Topics Concern  . None   Social History Narrative   Review of Systems  Constitutional: Negative for fever.  Respiratory: Positive for cough and shortness of breath.    Objective:  BP 144/62 mmHg  Pulse 69  Temp(Src) 97.5 F (36.4 C) (Oral)  Wt 183 lb 6 oz (83.178 kg)  SpO2 93%  BP/Weight 12/03/2015 11/26/2015 123456  Systolic BP 123456 [id number]  Diastolic BP 62 50 62  Wt. (Lbs) 183.38 182.6 182  BMI 32.49 32.35 32.25   Physical Exam  Constitutional: She appears well-developed. No distress.  Cardiovascular: Normal rate and regular rhythm.   Pulmonary/Chest: Effort normal.  Basilar crackles.  Neurological: She is alert.  Psychiatric: She has a normal mood and affect.  Vitals reviewed.  Lab Results  Component Value Date   WBC 7.4 11/26/2015   HGB 12.3 11/26/2015   HCT 37.8 11/26/2015   PLT 279.0 11/26/2015   GLUCOSE 95 11/26/2015   CHOL 214* 07/16/2014   TRIG 207.0*  07/16/2014   HDL 48.80 07/16/2014   LDLDIRECT 135.0 07/16/2014   LDLCALC 150* 03/15/2013   ALT 12 11/26/2015   AST 23 11/26/2015   NA 137 11/26/2015   K 4.6 11/26/2015   CL 102 11/26/2015   CREATININE 1.15 11/26/2015   BUN 26* 11/26/2015   CO2 28 11/26/2015   TSH 3.20 03/06/2015   HGBA1C 6.0 03/14/2013   MICROALBUR 0.7 01/15/2014   Assessment & Plan:   Problem List Items Addressed This Visit    Chronic cough - Primary    Established problem, worsening. Switching to [facility]. May need follow with pulmonology. We'll monitor closely. No need for imaging as she just had a chest x-ray 6 days ago.         Meds ordered this encounter  Medications  . mometasone-formoterol (DULERA) 200-5 MCG/ACT AERO    Sig: Inhale 2 puffs into the lungs 2 (two) times daily.    Dispense:  13 g    Refill:  6   Follow-up: PRN  [place]

## 2015-12-04 ENCOUNTER — Telehealth: Payer: Self-pay

## 2015-12-04 NOTE — Telephone Encounter (Signed)
PA for [facility] completed on Cover my meds.

## 2015-12-07 NOTE — Telephone Encounter (Signed)
PA approved from 09/05/2015-12/03/2016. thanks

## 2015-12-08 DIAGNOSIS — I509 Heart failure, unspecified: Secondary | ICD-10-CM | POA: Diagnosis not present

## 2015-12-08 DIAGNOSIS — Z87440 Personal history of urinary (tract) infections: Secondary | ICD-10-CM | POA: Diagnosis not present

## 2015-12-08 DIAGNOSIS — J45909 Unspecified asthma, uncomplicated: Secondary | ICD-10-CM | POA: Diagnosis not present

## 2015-12-08 DIAGNOSIS — I110 Hypertensive heart disease with heart failure: Secondary | ICD-10-CM | POA: Diagnosis not present

## 2015-12-08 DIAGNOSIS — R32 Unspecified urinary incontinence: Secondary | ICD-10-CM | POA: Diagnosis not present

## 2015-12-08 DIAGNOSIS — I739 Peripheral vascular disease, unspecified: Secondary | ICD-10-CM | POA: Diagnosis not present

## 2015-12-16 ENCOUNTER — Telehealth: Payer: Self-pay | Admitting: *Deleted

## 2015-12-16 DIAGNOSIS — I739 Peripheral vascular disease, unspecified: Secondary | ICD-10-CM | POA: Diagnosis not present

## 2015-12-16 DIAGNOSIS — I509 Heart failure, unspecified: Secondary | ICD-10-CM | POA: Diagnosis not present

## 2015-12-16 DIAGNOSIS — I110 Hypertensive heart disease with heart failure: Secondary | ICD-10-CM | POA: Diagnosis not present

## 2015-12-16 DIAGNOSIS — Z87440 Personal history of urinary (tract) infections: Secondary | ICD-10-CM | POA: Diagnosis not present

## 2015-12-16 DIAGNOSIS — R32 Unspecified urinary incontinence: Secondary | ICD-10-CM | POA: Diagnosis not present

## 2015-12-16 DIAGNOSIS — J45909 Unspecified asthma, uncomplicated: Secondary | ICD-10-CM | POA: Diagnosis not present

## 2015-12-16 NOTE — Telephone Encounter (Signed)
Patient has requested another medication to help with the yeast patches in he mouth . She has rinsed Biotin, however this has not worked. She currently uses the inhaler daily [facility]  [phone]

## 2015-12-16 NOTE — Telephone Encounter (Signed)
Please advise, thanks.

## 2015-12-16 NOTE — Telephone Encounter (Signed)
I have not seen her for this. She will need to be seen.

## 2015-12-17 ENCOUNTER — Other Ambulatory Visit: Payer: Self-pay

## 2015-12-17 ENCOUNTER — Telehealth: Payer: Self-pay | Admitting: Family Medicine

## 2015-12-17 ENCOUNTER — Other Ambulatory Visit: Payer: Self-pay | Admitting: Family Medicine

## 2015-12-17 DIAGNOSIS — B370 Candidal stomatitis: Secondary | ICD-10-CM

## 2015-12-17 MED ORDER — NYSTATIN 100000 UNIT/ML MT SUSP: 5.0000 mL | Freq: Four times a day (QID) | OROMUCOSAL | 0 refills | Status: DC

## 2015-12-17 MED ORDER — NYSTATIN 100000 UNIT/ML MT SUSP: 5.0000 mL | Freq: Four times a day (QID) | OROMUCOSAL | Status: DC

## 2015-12-17 NOTE — Telephone Encounter (Signed)
Patient stated that Rx did not receive her Rx

## 2015-12-17 NOTE — Telephone Encounter (Signed)
Rx sent 

## 2015-12-17 NOTE — Telephone Encounter (Signed)
Pt called and stated if Dr Lacinda Axon could prescribe the medication for her per pt she stated he prescribed something for her before. Pt stated she would rather not come in if she did not have to. Please advise?  Call pt @ [phone]. Thank you!

## 2015-12-17 NOTE — Telephone Encounter (Signed)
This was already sent in another note to Dr. Lacinda Axon for review.  Dupicate. thanks

## 2015-12-17 NOTE — Telephone Encounter (Signed)
Dr. Lacinda Axon, can you assist with this, per the patient you have prescribed something for this for her, thanks

## 2015-12-17 NOTE — Telephone Encounter (Signed)
Notified the patient thanks 

## 2015-12-17 NOTE — Telephone Encounter (Signed)
Pt saw Dr. Lacinda Axon this week. She would like to have something for her mouth. She has thrush and it hurts real bad.

## 2015-12-17 NOTE — Telephone Encounter (Signed)
Please schedule patient for an appt, new issues, thanks

## 2015-12-17 NOTE — Telephone Encounter (Signed)
Resent the RX as it was ordered as a clinic dispense, thanks

## 2015-12-21 ENCOUNTER — Other Ambulatory Visit: Payer: Self-pay | Admitting: Family Medicine

## 2015-12-21 ENCOUNTER — Other Ambulatory Visit: Payer: Self-pay

## 2015-12-21 ENCOUNTER — Telehealth: Payer: Self-pay | Admitting: Family Medicine

## 2015-12-21 DIAGNOSIS — B370 Candidal stomatitis: Secondary | ICD-10-CM

## 2015-12-21 MED ORDER — NYSTATIN 100000 UNIT/ML MT SUSP: 5.0000 mL | Freq: Four times a day (QID) | OROMUCOSAL | Status: DC

## 2015-12-21 MED ORDER — NYSTATIN 100000 UNIT/ML MT SUSP: 5.0000 mL | Freq: Four times a day (QID) | OROMUCOSAL | 0 refills | Status: DC

## 2015-12-21 NOTE — Telephone Encounter (Signed)
Rx refilled. If this persists, she will need to be seen.

## 2015-12-21 NOTE — Telephone Encounter (Signed)
Spoke with the patient, thanks

## 2015-12-21 NOTE — Telephone Encounter (Signed)
Please advise, thanks.

## 2015-12-21 NOTE — Telephone Encounter (Signed)
Pt called about still having thrush in her mouth even after taking the medication. It's better but not well. Pt wants to know when to start her inhaler? And does Dr Lacinda Axon want to refill her medication for her mouth? Because it's not gone. Please advise?  Call pt @ [phone]. Thank you!

## 2015-12-21 NOTE — Telephone Encounter (Signed)
Resent rx  thanks

## 2015-12-21 NOTE — Telephone Encounter (Signed)
Pt called stating the Rx was not at the pharmacy [facility] [place].   Thank you!

## 2015-12-21 NOTE — Telephone Encounter (Signed)
OK. I called pt and stated understanding. Thank you!

## 2015-12-22 DIAGNOSIS — J45909 Unspecified asthma, uncomplicated: Secondary | ICD-10-CM | POA: Diagnosis not present

## 2015-12-22 DIAGNOSIS — I110 Hypertensive heart disease with heart failure: Secondary | ICD-10-CM | POA: Diagnosis not present

## 2015-12-22 DIAGNOSIS — I509 Heart failure, unspecified: Secondary | ICD-10-CM | POA: Diagnosis not present

## 2015-12-22 DIAGNOSIS — Z87440 Personal history of urinary (tract) infections: Secondary | ICD-10-CM | POA: Diagnosis not present

## 2015-12-22 DIAGNOSIS — R32 Unspecified urinary incontinence: Secondary | ICD-10-CM | POA: Diagnosis not present

## 2015-12-22 DIAGNOSIS — I739 Peripheral vascular disease, unspecified: Secondary | ICD-10-CM | POA: Diagnosis not present

## 2015-12-24 ENCOUNTER — Ambulatory Visit: Payer: Medicare Other | Admitting: Internal Medicine

## 2015-12-24 ENCOUNTER — Telehealth: Payer: Self-pay | Admitting: Family Medicine

## 2015-12-24 DIAGNOSIS — I110 Hypertensive heart disease with heart failure: Secondary | ICD-10-CM | POA: Diagnosis not present

## 2015-12-24 DIAGNOSIS — R32 Unspecified urinary incontinence: Secondary | ICD-10-CM | POA: Diagnosis not present

## 2015-12-24 DIAGNOSIS — Z87440 Personal history of urinary (tract) infections: Secondary | ICD-10-CM | POA: Diagnosis not present

## 2015-12-24 DIAGNOSIS — I509 Heart failure, unspecified: Secondary | ICD-10-CM | POA: Diagnosis not present

## 2015-12-24 DIAGNOSIS — J45909 Unspecified asthma, uncomplicated: Secondary | ICD-10-CM | POA: Diagnosis not present

## 2015-12-24 DIAGNOSIS — I739 Peripheral vascular disease, unspecified: Secondary | ICD-10-CM | POA: Diagnosis not present

## 2015-12-24 NOTE — Telephone Encounter (Signed)
Patient Name: ANNAELLE VALLONE DOB: 1921-09-29 Initial Comment Caller states client blacked out last night, having trouble with right eye. Nurse Assessment Nurse: Ronnald Ramp, RN, Miranda Date/Time (Eastern Time): 12/24/2015 11:22:31 AM Confirm and document reason for call. If symptomatic, describe symptoms. You must click the next button to save text entered. ---Caller states the patient lives alone and told the caregiver this morning about an incident that happened during the night. The pt states her vision went black in her right eye for about 1 minute. This morning the pt is complaining of feeling tired. Caller states she feels the pt is more weak than normal. The pt called her home nurse, who called the office and made an appt. The appt is not until tomorrow. Has the patient traveled out of the country within the last 30 days? ---Not Applicable Does the patient have any new or worsening symptoms? ---Yes Will a triage be completed? ---Yes Related visit to physician within the last 2 weeks? ---No Does the PT have any chronic conditions? (i.e. diabetes, asthma, etc.) ---Yes List chronic conditions. ---HTN, Depression, GERD Is this a behavioral health or substance abuse call? ---No Guidelines Guideline Title Affirmed Question Affirmed Notes Weakness (Generalized) and Fatigue Taking a medicine that could cause weakness (e.g., blood pressure medications, diuretics) Final Disposition User See Physician within 24 Hours Jones, RN, Miranda Referrals REFERRED TO PCP OFFICE Disagree/Comply: Leta Baptist

## 2015-12-24 NOTE — Telephone Encounter (Signed)
PAtient on your schedule tomorrow, please advise, thanks

## 2015-12-25 ENCOUNTER — Ambulatory Visit (INDEPENDENT_AMBULATORY_CARE_PROVIDER_SITE_OTHER): Payer: Medicare Other | Admitting: Family Medicine

## 2015-12-25 ENCOUNTER — Ambulatory Visit: Payer: Medicare Other

## 2015-12-25 ENCOUNTER — Encounter: Payer: Self-pay | Admitting: Family Medicine

## 2015-12-25 VITALS — BP 113/66 | HR 73 | Temp 98.3°F

## 2015-12-25 DIAGNOSIS — G453 Amaurosis fugax: Secondary | ICD-10-CM | POA: Diagnosis not present

## 2015-12-25 DIAGNOSIS — H43813 Vitreous degeneration, bilateral: Secondary | ICD-10-CM | POA: Diagnosis not present

## 2015-12-25 LAB — SEDIMENTATION RATE: Sed Rate: 56 mm/hr — ABNORMAL HIGH (ref 0–30)

## 2015-12-25 LAB — C-REACTIVE PROTEIN: CRP: 0.1 mg/dL — ABNORMAL LOW (ref 0.5–20.0)

## 2015-12-25 NOTE — Progress Notes (Signed)
Pre visit review using our clinic review tool, if applicable. No additional management support is needed unless otherwise documented below in the visit note. 

## 2015-12-25 NOTE — Patient Instructions (Signed)
We will call with the lab results and regarding the carotid US.  Please have the eye doctor send me the information.  Take care  Dr. Lacinda Axon

## 2015-12-25 NOTE — Progress Notes (Signed)
   Subjective:  Patient ID: Cindy Graham, female    DOB: 01-16-1922  Age: 82 y.o. MRN: [id number]  CC: Vision loss.  HPI:  82 year old female with CKD, hyperlipidemia, hypertension, peripheral vessel disease presents with complaints of transient vision loss.  Patient states that Wednesday night around 11 PM she had sudden onset vision loss in her right eye. She states that her vision went completely black and then I and subsequently returned quickly (took approximately 1 min). Patient had no other associated symptoms - weakness, focal neurological deficit, confusion, etc.   Patient states that her vision is fine at this moment in time. She states that she is feeling well. She is quite concerned about her recent loss of vision. She states that she has an upcoming appointment with her ophthalmologist this afternoon. No other complaints at this time  Social Hx   Social History   Social History  . Marital status: Married    Spouse name: N/A  . Number of children: N/A  . Years of education: N/A   Social History Main Topics  . Smoking status: Never Smoker  . Smokeless tobacco: Never Used  . Alcohol use No  . Drug use: No  . Sexual activity: Not Currently   Other Topics Concern  . None   Social History Narrative  . None   Review of Systems  Constitutional: Negative.   Eyes: Positive for visual disturbance.  Neurological: Negative.    Objective:  BP 113/66 (BP Location: Left Arm, Patient Position: Sitting, Cuff Size: Normal)   Pulse 73   Temp 98.3 F (36.8 C) (Oral)   SpO2 95%   BP/Weight 12/25/2015 05/31/3233 09/27/3218  Systolic BP 254 270 623  Diastolic BP 66 62 50  Wt. (Lbs) - 183.38 182.6  BMI - 32.49 32.35   Physical Exam  Constitutional: She appears well-developed. No distress.  Cardiovascular: Normal rate and regular rhythm.   Neurological: She is alert.  No apparent focal neurological deficits. Normal muscle strength for age.  Psychiatric: She has a normal mood  and affect.  Vitals reviewed.  Lab Results  Component Value Date   WBC 7.4 11/26/2015   HGB 12.3 11/26/2015   HCT 37.8 11/26/2015   PLT 279.0 11/26/2015   GLUCOSE 95 11/26/2015   CHOL 214 (H) 07/16/2014   TRIG 207.0 (H) 07/16/2014   HDL 48.80 07/16/2014   LDLDIRECT 135.0 07/16/2014   LDLCALC 150 (H) 03/15/2013   ALT 12 11/26/2015   AST 23 11/26/2015   NA 137 11/26/2015   K 4.6 11/26/2015   CL 102 11/26/2015   CREATININE 1.15 11/26/2015   BUN 26 (H) 11/26/2015   CO2 28 11/26/2015   TSH 3.20 03/06/2015   HGBA1C 6.0 03/14/2013   MICROALBUR 0.7 01/15/2014    Assessment & Plan:   Problem List Items Addressed This Visit    Amaurosis fugax - Primary    New problem. Unclear etiology/prognosis. Obtaining further workup: ESR, CRP, carotid ultrasound. Patient needs to see her ophthalmologist today. She has an appointment.       Relevant Orders   Sed Rate (ESR)   C-reactive protein   US Carotid Duplex Bilateral   US Carotid Duplex Bilateral    Other Visit Diagnoses   None.    Follow-up: PRN  [place]

## 2015-12-25 NOTE — Assessment & Plan Note (Signed)
New problem. Unclear etiology/prognosis. Obtaining further workup: ESR, CRP, carotid ultrasound. Patient needs to see her ophthalmologist today. She has an appointment.

## 2015-12-28 ENCOUNTER — Ambulatory Visit: Payer: Medicare Other

## 2015-12-28 ENCOUNTER — Other Ambulatory Visit: Payer: Medicare Other

## 2015-12-28 ENCOUNTER — Ambulatory Visit
Admission: RE | Admit: 2015-12-28 | Discharge: 2015-12-28 | Disposition: A | Discharge status: Home / Self Care | Payer: Medicare Other | Source: Ambulatory Visit | Attending: Family Medicine | Admitting: Family Medicine

## 2015-12-28 DIAGNOSIS — G453 Amaurosis fugax: Secondary | ICD-10-CM | POA: Insufficient documentation

## 2015-12-28 DIAGNOSIS — I6523 Occlusion and stenosis of bilateral carotid arteries: Secondary | ICD-10-CM | POA: Diagnosis not present

## 2015-12-29 DIAGNOSIS — I739 Peripheral vascular disease, unspecified: Secondary | ICD-10-CM | POA: Diagnosis not present

## 2015-12-29 DIAGNOSIS — R32 Unspecified urinary incontinence: Secondary | ICD-10-CM | POA: Diagnosis not present

## 2015-12-29 DIAGNOSIS — Z87440 Personal history of urinary (tract) infections: Secondary | ICD-10-CM | POA: Diagnosis not present

## 2015-12-29 DIAGNOSIS — I110 Hypertensive heart disease with heart failure: Secondary | ICD-10-CM | POA: Diagnosis not present

## 2015-12-29 DIAGNOSIS — I509 Heart failure, unspecified: Secondary | ICD-10-CM | POA: Diagnosis not present

## 2015-12-29 DIAGNOSIS — J45909 Unspecified asthma, uncomplicated: Secondary | ICD-10-CM | POA: Diagnosis not present

## 2016-01-04 ENCOUNTER — Ambulatory Visit (INDEPENDENT_AMBULATORY_CARE_PROVIDER_SITE_OTHER): Payer: Medicare Other | Admitting: Family Medicine

## 2016-01-04 ENCOUNTER — Encounter (INDEPENDENT_AMBULATORY_CARE_PROVIDER_SITE_OTHER): Payer: Self-pay

## 2016-01-04 ENCOUNTER — Encounter: Payer: Self-pay | Admitting: Family Medicine

## 2016-01-04 DIAGNOSIS — R05 Cough: Secondary | ICD-10-CM

## 2016-01-04 DIAGNOSIS — R600 Localized edema: Secondary | ICD-10-CM

## 2016-01-04 DIAGNOSIS — R053 Chronic cough: Secondary | ICD-10-CM

## 2016-01-04 DIAGNOSIS — G453 Amaurosis fugax: Secondary | ICD-10-CM | POA: Diagnosis not present

## 2016-01-04 NOTE — Assessment & Plan Note (Signed)
Resolve. No further vision issues. Has had unremarkable workup/exam.

## 2016-01-04 NOTE — Progress Notes (Signed)
Subjective:  Patient ID: Cindy Graham, female    DOB: [birth year]  Age: 82 y.o. MRN: [id number]  CC: Follow up  HPI:  82 year old female with Chronic back pain, chronic cough/interstitial lung disease, hypertension, hyperlipidemia, peripheral vascular disease presents for follow-up.  Patient recently seen on 8/4. Had transient visual loss. Underwent carotid Dopplers and ophthalmological exam. She presents today for follow-up.  Patient has had no further issues with vision loss. She states that her eye exam was unremarkable. Carotid Dopplers were negative. Elevated ESR but no other findings concerning for temporal arteritis. Patient states that she seems to be doing fine. She does note some continued intermittent cough. She has a known history of chronic cough/chronic bronchitis/possible interstitial lung disease. She states that she is taking her dulera was today. She would like to discuss this.   Additionally, she's had some recent lower extremity edema. This is now resolved. No reports of increasing shortness of breath. No other complaints this time.  Social Hx   Social History   Social History  . Marital status: Married    Spouse name: N/A  . Number of children: N/A  . Years of education: N/A   Social History Main Topics  . Smoking status: Never Smoker  . Smokeless tobacco: Never Used  . Alcohol use No  . Drug use: No  . Sexual activity: Not Currently   Other Topics Concern  . None   Social History Narrative  . None   Review of Systems  Constitutional: Negative.   Eyes: Negative for visual disturbance.  Respiratory: Positive for cough.   Cardiovascular: Positive for leg swelling.   Objective:  BP 127/70 (BP Location: Left Arm, Patient Position: Sitting, Cuff Size: Large)   Pulse 69   Temp 98.1 F (36.7 C) (Oral)   Wt 183 lb 4 oz (83.1 kg)   SpO2 93%   BMI 32.46 kg/m   BP/Weight 01/04/2016 12/25/2015 0/01/2329  Systolic BP 076 226 333  Diastolic BP 70 66 62    Wt. (Lbs) 183.25 - 183.38  BMI 32.46 - 32.49   Physical Exam  Constitutional: She appears well-developed. No distress.  Cardiovascular: Normal rate and regular rhythm.   Murmur heard. Pulmonary/Chest: Effort normal. She has no wheezes. She has no rales.  Neurological: She is alert.  Psychiatric: She has a normal mood and affect.  Vitals reviewed.  Lab Results  Component Value Date   WBC 7.4 11/26/2015   HGB 12.3 11/26/2015   HCT 37.8 11/26/2015   PLT 279.0 11/26/2015   GLUCOSE 95 11/26/2015   CHOL 214 (H) 07/16/2014   TRIG 207.0 (H) 07/16/2014   HDL 48.80 07/16/2014   LDLDIRECT 135.0 07/16/2014   LDLCALC 150 (H) 03/15/2013   ALT 12 11/26/2015   AST 23 11/26/2015   NA 137 11/26/2015   K 4.6 11/26/2015   CL 102 11/26/2015   CREATININE 1.15 11/26/2015   BUN 26 (H) 11/26/2015   CO2 28 11/26/2015   TSH 3.20 03/06/2015   HGBA1C 6.0 03/14/2013   MICROALBUR 0.7 01/15/2014    Assessment & Plan:   Problem List Items Addressed This Visit    Amaurosis fugax    Resolve. No further vision issues. Has had unremarkable workup/exam.       Chronic cough    Stable. Patient taking dulera once a day. Advised patient to take twice daily as prescribed.      Lower extremity edema    New problem. Has now improved. Likely secondary to being sedentary/venous  stasis. Will continue to monitor closely.        Other Visit Diagnoses   None.    Follow-up: Return for 3-6 month follow up .  [place]

## 2016-01-04 NOTE — Assessment & Plan Note (Signed)
New problem. Has now improved. Likely secondary to being sedentary/venous stasis. Will continue to monitor closely.

## 2016-01-04 NOTE — Progress Notes (Signed)
Pre visit review using our clinic review tool, if applicable. No additional management support is needed unless otherwise documented below in the visit note. 

## 2016-01-04 NOTE — Assessment & Plan Note (Signed)
Stable. Patient taking dulera once a day. Advised patient to take twice daily as prescribed.

## 2016-01-07 DIAGNOSIS — I110 Hypertensive heart disease with heart failure: Secondary | ICD-10-CM | POA: Diagnosis not present

## 2016-01-07 DIAGNOSIS — J45909 Unspecified asthma, uncomplicated: Secondary | ICD-10-CM | POA: Diagnosis not present

## 2016-01-07 DIAGNOSIS — I739 Peripheral vascular disease, unspecified: Secondary | ICD-10-CM | POA: Diagnosis not present

## 2016-01-07 DIAGNOSIS — R32 Unspecified urinary incontinence: Secondary | ICD-10-CM | POA: Diagnosis not present

## 2016-01-07 DIAGNOSIS — Z87440 Personal history of urinary (tract) infections: Secondary | ICD-10-CM | POA: Diagnosis not present

## 2016-01-07 DIAGNOSIS — I509 Heart failure, unspecified: Secondary | ICD-10-CM | POA: Diagnosis not present

## 2016-01-08 ENCOUNTER — Other Ambulatory Visit: Payer: Self-pay

## 2016-01-08 MED ORDER — TRIAMTERENE-HCTZ 37.5-25 MG PO CAPS: ORAL_CAPSULE | ORAL | 1 refills | Status: DC

## 2016-01-08 MED ORDER — NITROGLYCERIN 0.3 MG SL SUBL: SUBLINGUAL_TABLET | SUBLINGUAL | 0 refills | Status: DC

## 2016-01-08 NOTE — Telephone Encounter (Signed)
Covering for Dr. Lacinda Axon. We will refill the nitroglycerin and defer verapamil refill to Dr. Lacinda Axon given potential interactions.

## 2016-01-08 NOTE — Telephone Encounter (Signed)
Refill request from [facility] for Verapamil. Last fill was 07/24/15 for 90 tablets with 1 refill. When trying to refill allergy contradiction came up. Please review. Thank you. Last office visit was 01/04/16-aa

## 2016-01-12 ENCOUNTER — Other Ambulatory Visit: Payer: Self-pay | Admitting: Family Medicine

## 2016-01-12 MED ORDER — VERAPAMIL HCL ER 180 MG PO TBCR: 180.0000 mg | EXTENDED_RELEASE_TABLET | Freq: Every day | ORAL | 3 refills | Status: DC

## 2016-01-13 DIAGNOSIS — I739 Peripheral vascular disease, unspecified: Secondary | ICD-10-CM | POA: Diagnosis not present

## 2016-01-13 DIAGNOSIS — I110 Hypertensive heart disease with heart failure: Secondary | ICD-10-CM | POA: Diagnosis not present

## 2016-01-13 DIAGNOSIS — R32 Unspecified urinary incontinence: Secondary | ICD-10-CM | POA: Diagnosis not present

## 2016-01-13 DIAGNOSIS — I509 Heart failure, unspecified: Secondary | ICD-10-CM | POA: Diagnosis not present

## 2016-01-13 DIAGNOSIS — J45909 Unspecified asthma, uncomplicated: Secondary | ICD-10-CM | POA: Diagnosis not present

## 2016-01-13 DIAGNOSIS — Z87440 Personal history of urinary (tract) infections: Secondary | ICD-10-CM | POA: Diagnosis not present

## 2016-01-15 DIAGNOSIS — Z87440 Personal history of urinary (tract) infections: Secondary | ICD-10-CM | POA: Diagnosis not present

## 2016-01-15 DIAGNOSIS — R32 Unspecified urinary incontinence: Secondary | ICD-10-CM | POA: Diagnosis not present

## 2016-01-15 DIAGNOSIS — I739 Peripheral vascular disease, unspecified: Secondary | ICD-10-CM | POA: Diagnosis not present

## 2016-01-15 DIAGNOSIS — J45909 Unspecified asthma, uncomplicated: Secondary | ICD-10-CM | POA: Diagnosis not present

## 2016-01-15 DIAGNOSIS — I110 Hypertensive heart disease with heart failure: Secondary | ICD-10-CM | POA: Diagnosis not present

## 2016-01-15 DIAGNOSIS — I509 Heart failure, unspecified: Secondary | ICD-10-CM | POA: Diagnosis not present

## 2016-01-21 DIAGNOSIS — R32 Unspecified urinary incontinence: Secondary | ICD-10-CM | POA: Diagnosis not present

## 2016-01-21 DIAGNOSIS — I110 Hypertensive heart disease with heart failure: Secondary | ICD-10-CM | POA: Diagnosis not present

## 2016-01-21 DIAGNOSIS — Z87440 Personal history of urinary (tract) infections: Secondary | ICD-10-CM | POA: Diagnosis not present

## 2016-01-21 DIAGNOSIS — J45909 Unspecified asthma, uncomplicated: Secondary | ICD-10-CM | POA: Diagnosis not present

## 2016-01-21 DIAGNOSIS — I739 Peripheral vascular disease, unspecified: Secondary | ICD-10-CM | POA: Diagnosis not present

## 2016-01-21 DIAGNOSIS — I509 Heart failure, unspecified: Secondary | ICD-10-CM | POA: Diagnosis not present

## 2016-01-22 ENCOUNTER — Telehealth: Payer: Self-pay | Admitting: Family Medicine

## 2016-01-22 NOTE — Telephone Encounter (Signed)
Please advise 

## 2016-01-22 NOTE — Telephone Encounter (Signed)
Malinda [phone] called from [facility] regarding to get a verbal ok to continue nursing nurses? VM can be left. Thank you!

## 2016-01-22 NOTE — Telephone Encounter (Signed)
Cindy Graham has been informed.

## 2016-01-22 NOTE — Telephone Encounter (Signed)
Ok to give verbal 

## 2016-01-25 DIAGNOSIS — K219 Gastro-esophageal reflux disease without esophagitis: Secondary | ICD-10-CM | POA: Diagnosis not present

## 2016-01-25 DIAGNOSIS — M2041 Other hammer toe(s) (acquired), right foot: Secondary | ICD-10-CM | POA: Diagnosis not present

## 2016-01-25 DIAGNOSIS — I739 Peripheral vascular disease, unspecified: Secondary | ICD-10-CM | POA: Diagnosis not present

## 2016-01-25 DIAGNOSIS — R32 Unspecified urinary incontinence: Secondary | ICD-10-CM | POA: Diagnosis not present

## 2016-01-25 DIAGNOSIS — I110 Hypertensive heart disease with heart failure: Secondary | ICD-10-CM | POA: Diagnosis not present

## 2016-01-25 DIAGNOSIS — Z9981 Dependence on supplemental oxygen: Secondary | ICD-10-CM | POA: Diagnosis not present

## 2016-01-25 DIAGNOSIS — J449 Chronic obstructive pulmonary disease, unspecified: Secondary | ICD-10-CM | POA: Diagnosis not present

## 2016-01-25 DIAGNOSIS — Z7982 Long term (current) use of aspirin: Secondary | ICD-10-CM | POA: Diagnosis not present

## 2016-01-25 DIAGNOSIS — Z87440 Personal history of urinary (tract) infections: Secondary | ICD-10-CM | POA: Diagnosis not present

## 2016-01-25 DIAGNOSIS — Z8701 Personal history of pneumonia (recurrent): Secondary | ICD-10-CM | POA: Diagnosis not present

## 2016-01-25 DIAGNOSIS — M19019 Primary osteoarthritis, unspecified shoulder: Secondary | ICD-10-CM | POA: Diagnosis not present

## 2016-01-25 DIAGNOSIS — Z8501 Personal history of malignant neoplasm of esophagus: Secondary | ICD-10-CM | POA: Diagnosis not present

## 2016-01-25 DIAGNOSIS — I509 Heart failure, unspecified: Secondary | ICD-10-CM | POA: Diagnosis not present

## 2016-01-25 DIAGNOSIS — J45909 Unspecified asthma, uncomplicated: Secondary | ICD-10-CM | POA: Diagnosis not present

## 2016-01-26 ENCOUNTER — Telehealth: Payer: Self-pay | Admitting: *Deleted

## 2016-01-26 ENCOUNTER — Other Ambulatory Visit: Payer: Self-pay | Admitting: Family Medicine

## 2016-01-26 DIAGNOSIS — I739 Peripheral vascular disease, unspecified: Secondary | ICD-10-CM | POA: Diagnosis not present

## 2016-01-26 DIAGNOSIS — I509 Heart failure, unspecified: Secondary | ICD-10-CM | POA: Diagnosis not present

## 2016-01-26 DIAGNOSIS — J45909 Unspecified asthma, uncomplicated: Secondary | ICD-10-CM | POA: Diagnosis not present

## 2016-01-26 DIAGNOSIS — J449 Chronic obstructive pulmonary disease, unspecified: Secondary | ICD-10-CM | POA: Diagnosis not present

## 2016-01-26 DIAGNOSIS — R252 Cramp and spasm: Secondary | ICD-10-CM

## 2016-01-26 DIAGNOSIS — I110 Hypertensive heart disease with heart failure: Secondary | ICD-10-CM | POA: Diagnosis not present

## 2016-01-26 DIAGNOSIS — R32 Unspecified urinary incontinence: Secondary | ICD-10-CM | POA: Diagnosis not present

## 2016-01-26 NOTE — Telephone Encounter (Signed)
Linda from [facility] has requested to have a Met B or a renal panel drawn on patient. Patient complained of having leg cramps in both legs.n Her blood pressure this morning was 170/72 before morning medication Contact  [employer]  [phone]

## 2016-01-26 NOTE — Telephone Encounter (Signed)
Patient has foolo wup appt with you in December, please advise, thanks

## 2016-02-02 DIAGNOSIS — J45909 Unspecified asthma, uncomplicated: Secondary | ICD-10-CM | POA: Diagnosis not present

## 2016-02-02 DIAGNOSIS — I739 Peripheral vascular disease, unspecified: Secondary | ICD-10-CM | POA: Diagnosis not present

## 2016-02-02 DIAGNOSIS — I110 Hypertensive heart disease with heart failure: Secondary | ICD-10-CM | POA: Diagnosis not present

## 2016-02-02 DIAGNOSIS — J449 Chronic obstructive pulmonary disease, unspecified: Secondary | ICD-10-CM | POA: Diagnosis not present

## 2016-02-02 DIAGNOSIS — I509 Heart failure, unspecified: Secondary | ICD-10-CM | POA: Diagnosis not present

## 2016-02-02 DIAGNOSIS — R32 Unspecified urinary incontinence: Secondary | ICD-10-CM | POA: Diagnosis not present

## 2016-02-07 ENCOUNTER — Telehealth: Payer: Self-pay | Admitting: Family Medicine

## 2016-02-07 DIAGNOSIS — R32 Unspecified urinary incontinence: Secondary | ICD-10-CM | POA: Diagnosis not present

## 2016-02-07 DIAGNOSIS — R300 Dysuria: Secondary | ICD-10-CM | POA: Diagnosis not present

## 2016-02-07 DIAGNOSIS — R350 Frequency of micturition: Secondary | ICD-10-CM | POA: Diagnosis not present

## 2016-02-07 DIAGNOSIS — I509 Heart failure, unspecified: Secondary | ICD-10-CM | POA: Diagnosis not present

## 2016-02-07 DIAGNOSIS — R3911 Hesitancy of micturition: Secondary | ICD-10-CM | POA: Diagnosis not present

## 2016-02-07 DIAGNOSIS — J449 Chronic obstructive pulmonary disease, unspecified: Secondary | ICD-10-CM | POA: Diagnosis not present

## 2016-02-07 DIAGNOSIS — Z87440 Personal history of urinary (tract) infections: Secondary | ICD-10-CM | POA: Diagnosis not present

## 2016-02-07 DIAGNOSIS — I739 Peripheral vascular disease, unspecified: Secondary | ICD-10-CM | POA: Diagnosis not present

## 2016-02-07 DIAGNOSIS — J45909 Unspecified asthma, uncomplicated: Secondary | ICD-10-CM | POA: Diagnosis not present

## 2016-02-07 DIAGNOSIS — I110 Hypertensive heart disease with heart failure: Secondary | ICD-10-CM | POA: Diagnosis not present

## 2016-02-07 NOTE — Telephone Encounter (Signed)
Received a call from [facility] regarding this patient. They report that [facility] wanted to speak with the on-call doctor about getting orders for a urine specimen. I gave verbal orders. The nurse from [facility] noted the patient called her today asking to have her urine evaluated stating she had frequency and burning and urgency. She did not know whether or not the patient had abdominal pain, fevers, chills, or any systemic symptoms. The home health nurse was outside of the patient's apartment at that time getting ready to go in to see the patient and I advised that she ask the patient if she was having abdominal pain, fevers, chills, or any systemic symptoms and if she was having these symptoms she would need to be evaluated today. If she is just having burning with urination, frequency, and urgency she can be evaluated tomorrow in our office. She noted she would advise the patient of my advice. I will forward to the PCP and his CMA to follow-up with the patient tomorrow.

## 2016-02-08 ENCOUNTER — Encounter: Payer: Self-pay | Admitting: Family Medicine

## 2016-02-08 ENCOUNTER — Ambulatory Visit (INDEPENDENT_AMBULATORY_CARE_PROVIDER_SITE_OTHER): Payer: Medicare Other | Admitting: Family Medicine

## 2016-02-08 VITALS — BP 146/66 | HR 77 | Temp 98.0°F | Wt 182.4 lb

## 2016-02-08 DIAGNOSIS — R300 Dysuria: Secondary | ICD-10-CM | POA: Diagnosis not present

## 2016-02-08 DIAGNOSIS — N390 Urinary tract infection, site not specified: Secondary | ICD-10-CM | POA: Diagnosis not present

## 2016-02-08 LAB — POCT URINALYSIS DIPSTICK
Glucose, UA: NEGATIVE
Ketones, UA: NEGATIVE
Nitrite, UA: NEGATIVE
Spec Grav, UA: 1.01
Urobilinogen, UA: 0.2
pH, UA: 7

## 2016-02-08 MED ORDER — CIPROFLOXACIN HCL 250 MG PO TABS: 250.0000 mg | ORAL_TABLET | Freq: Two times a day (BID) | ORAL | 0 refills | Status: DC

## 2016-02-08 NOTE — Telephone Encounter (Signed)
Pt was called and scheduled for today 02/08/16 at 1030.

## 2016-02-08 NOTE — Assessment & Plan Note (Signed)
Acute problem. Hx of UTI. Hx and UA consistent with UTI. Treating with Cipro (per patient request) while awaiting culture.

## 2016-02-08 NOTE — Progress Notes (Signed)
Pre visit review using our clinic review tool, if applicable. No additional management support is needed unless otherwise documented below in the visit note. 

## 2016-02-08 NOTE — Progress Notes (Signed)
Subjective:  Patient ID: Cindy Graham, female    DOB: 05-Apr-1922  Age: 82 y.o. MRN: [id number]  CC: ? UTI  HPI:  82 year old female with a complicated past medical history including CKD stage III presents with concerns for UTI.  Patient states that she developed an increase in urinary frequency as well as dysuria over the weekend. Symptoms are severe. She also states that her urine was strong in odor. She states that she has felt cold and hot. No documented fever. She reports associated lower abdominal discomfort as well. No known exacerbating or relieving factors. No reports of hematuria. No other associated symptoms. No other complaints this time.  Social Hx   Social History   Social History  . Marital status: Married    Spouse name: N/A  . Number of children: N/A  . Years of education: N/A   Social History Main Topics  . Smoking status: Never Smoker  . Smokeless tobacco: Never Used  . Alcohol use No  . Drug use: No  . Sexual activity: Not Currently   Other Topics Concern  . None   Social History Narrative  . None    Review of Systems  Constitutional: Positive for chills. Negative for fever.  Gastrointestinal: Positive for abdominal pain.  Genitourinary: Positive for dysuria and frequency.   Objective:  BP (!) 146/66 (BP Location: Left Arm, Patient Position: Sitting, Cuff Size: Large)   Pulse 77   Temp 98 F (36.7 C) (Oral)   Wt 182 lb 6 oz (82.7 kg)   SpO2 93%   BMI 32.31 kg/m   BP/Weight 02/08/2016 [id number]  Systolic BP 123456 [id number]  Diastolic BP 66 70 66  Wt. (Lbs) 182.38 183.25 -  BMI 32.31 32.46 -    Physical Exam  Constitutional: She appears well-developed. No distress.  Cardiovascular: Normal rate and regular rhythm.   Pulmonary/Chest: Effort normal and breath sounds normal.  Abdominal: Soft. She exhibits no distension. There is no tenderness. There is no rebound and no guarding.  Neurological: She is alert.  Psychiatric: She has a  normal mood and affect.  Vitals reviewed.  Lab Results  Component Value Date   WBC 7.4 11/26/2015   HGB 12.3 11/26/2015   HCT 37.8 11/26/2015   PLT 279.0 11/26/2015   GLUCOSE 95 11/26/2015   CHOL 214 (H) 07/16/2014   TRIG 207.0 (H) 07/16/2014   HDL 48.80 07/16/2014   LDLDIRECT 135.0 07/16/2014   LDLCALC 150 (H) 03/15/2013   ALT 12 11/26/2015   AST 23 11/26/2015   NA 137 11/26/2015   K 4.6 11/26/2015   CL 102 11/26/2015   CREATININE 1.15 11/26/2015   BUN 26 (H) 11/26/2015   CO2 28 11/26/2015   TSH 3.20 03/06/2015   HGBA1C 6.0 03/14/2013   MICROALBUR 0.7 01/15/2014   Results for orders placed or performed in visit on 02/08/16 (from the past 24 hour(s))  POCT Urinalysis Dipstick     Status: Abnormal   Collection Time: 02/08/16 11:18 AM  Result Value Ref Range   Color, UA yellow    Clarity, UA trubid    Glucose, UA neg    Bilirubin, UA small    Ketones, UA neg    Spec Grav, UA 1.010    Blood, UA large    pH, UA 7.0    Protein, UA 100+    Urobilinogen, UA 0.2    Nitrite, UA neg    Leukocytes, UA large (3+) (A) Negative   Assessment & Plan:  Problem List Items Addressed This Visit    UTI (lower urinary tract infection) - Primary    Acute problem. Hx of UTI. Hx and UA consistent with UTI. Treating with Cipro (per patient request) while awaiting culture.       Relevant Orders   POCT Urinalysis Dipstick (Completed)   Urine Culture    Other Visit Diagnoses   None.     Meds ordered this encounter  Medications  . ciprofloxacin (CIPRO) 250 MG tablet    Sig: Take 1 tablet (250 mg total) by mouth every 12 (twelve) hours.    Dispense:  14 tablet    Refill:  0    Follow-up: PRN  [place]

## 2016-02-09 DIAGNOSIS — I739 Peripheral vascular disease, unspecified: Secondary | ICD-10-CM | POA: Diagnosis not present

## 2016-02-09 DIAGNOSIS — R32 Unspecified urinary incontinence: Secondary | ICD-10-CM | POA: Diagnosis not present

## 2016-02-09 DIAGNOSIS — I509 Heart failure, unspecified: Secondary | ICD-10-CM | POA: Diagnosis not present

## 2016-02-09 DIAGNOSIS — J45909 Unspecified asthma, uncomplicated: Secondary | ICD-10-CM | POA: Diagnosis not present

## 2016-02-09 DIAGNOSIS — I110 Hypertensive heart disease with heart failure: Secondary | ICD-10-CM | POA: Diagnosis not present

## 2016-02-09 DIAGNOSIS — J449 Chronic obstructive pulmonary disease, unspecified: Secondary | ICD-10-CM | POA: Diagnosis not present

## 2016-02-11 ENCOUNTER — Encounter: Payer: Self-pay | Admitting: Family Medicine

## 2016-02-11 LAB — URINE CULTURE

## 2016-02-15 DIAGNOSIS — R32 Unspecified urinary incontinence: Secondary | ICD-10-CM | POA: Diagnosis not present

## 2016-02-15 DIAGNOSIS — I739 Peripheral vascular disease, unspecified: Secondary | ICD-10-CM | POA: Diagnosis not present

## 2016-02-15 DIAGNOSIS — J449 Chronic obstructive pulmonary disease, unspecified: Secondary | ICD-10-CM | POA: Diagnosis not present

## 2016-02-15 DIAGNOSIS — I110 Hypertensive heart disease with heart failure: Secondary | ICD-10-CM | POA: Diagnosis not present

## 2016-02-15 DIAGNOSIS — J45909 Unspecified asthma, uncomplicated: Secondary | ICD-10-CM | POA: Diagnosis not present

## 2016-02-15 DIAGNOSIS — I509 Heart failure, unspecified: Secondary | ICD-10-CM | POA: Diagnosis not present

## 2016-02-19 NOTE — Progress Notes (Signed)
Order(s) created erroneously. Erroneous order ID: [id number]  Order moved by: Alfonso Patten  Order move date/time: 02/19/2016 1:00 PM  Source Patient: [id number]  Source Contact: 02/11/2016  Destination Patient: [id number]  Destination Contact: 08/07/2012  Erroneous order ID: [id number]  Order moved by: Alfonso Patten  Order move date/time: 02/19/2016 1:00 PM  Source Patient: [id number]  Source Contact: 02/11/2016  Destination Patient: [id number]  Destination Contact: 08/07/2012  Erroneous order ID: [id number]  Order moved by: Alfonso Patten  Order move date/time: 02/19/2016 1:00 PM  Source Patient: [id number]  Source Contact: 02/11/2016  Destination Patient: [id number]  Destination Contact: 08/07/2012

## 2016-02-24 DIAGNOSIS — J449 Chronic obstructive pulmonary disease, unspecified: Secondary | ICD-10-CM | POA: Diagnosis not present

## 2016-02-24 DIAGNOSIS — J45909 Unspecified asthma, uncomplicated: Secondary | ICD-10-CM | POA: Diagnosis not present

## 2016-02-24 DIAGNOSIS — I509 Heart failure, unspecified: Secondary | ICD-10-CM | POA: Diagnosis not present

## 2016-02-24 DIAGNOSIS — R32 Unspecified urinary incontinence: Secondary | ICD-10-CM | POA: Diagnosis not present

## 2016-02-24 DIAGNOSIS — I110 Hypertensive heart disease with heart failure: Secondary | ICD-10-CM | POA: Diagnosis not present

## 2016-02-24 DIAGNOSIS — I739 Peripheral vascular disease, unspecified: Secondary | ICD-10-CM | POA: Diagnosis not present

## 2016-02-26 DIAGNOSIS — M5126 Other intervertebral disc displacement, lumbar region: Secondary | ICD-10-CM | POA: Diagnosis not present

## 2016-02-26 DIAGNOSIS — M1612 Unilateral primary osteoarthritis, left hip: Secondary | ICD-10-CM | POA: Diagnosis not present

## 2016-02-26 DIAGNOSIS — M1711 Unilateral primary osteoarthritis, right knee: Secondary | ICD-10-CM | POA: Diagnosis not present

## 2016-02-26 DIAGNOSIS — M5416 Radiculopathy, lumbar region: Secondary | ICD-10-CM | POA: Diagnosis not present

## 2016-02-29 DIAGNOSIS — I509 Heart failure, unspecified: Secondary | ICD-10-CM | POA: Diagnosis not present

## 2016-02-29 DIAGNOSIS — I739 Peripheral vascular disease, unspecified: Secondary | ICD-10-CM | POA: Diagnosis not present

## 2016-02-29 DIAGNOSIS — I110 Hypertensive heart disease with heart failure: Secondary | ICD-10-CM | POA: Diagnosis not present

## 2016-02-29 DIAGNOSIS — R32 Unspecified urinary incontinence: Secondary | ICD-10-CM | POA: Diagnosis not present

## 2016-02-29 DIAGNOSIS — J449 Chronic obstructive pulmonary disease, unspecified: Secondary | ICD-10-CM | POA: Diagnosis not present

## 2016-02-29 DIAGNOSIS — J45909 Unspecified asthma, uncomplicated: Secondary | ICD-10-CM | POA: Diagnosis not present

## 2016-03-07 DIAGNOSIS — J449 Chronic obstructive pulmonary disease, unspecified: Secondary | ICD-10-CM | POA: Diagnosis not present

## 2016-03-07 DIAGNOSIS — R32 Unspecified urinary incontinence: Secondary | ICD-10-CM | POA: Diagnosis not present

## 2016-03-07 DIAGNOSIS — I110 Hypertensive heart disease with heart failure: Secondary | ICD-10-CM | POA: Diagnosis not present

## 2016-03-07 DIAGNOSIS — I739 Peripheral vascular disease, unspecified: Secondary | ICD-10-CM | POA: Diagnosis not present

## 2016-03-07 DIAGNOSIS — I509 Heart failure, unspecified: Secondary | ICD-10-CM | POA: Diagnosis not present

## 2016-03-07 DIAGNOSIS — J45909 Unspecified asthma, uncomplicated: Secondary | ICD-10-CM | POA: Diagnosis not present

## 2016-03-09 ENCOUNTER — Other Ambulatory Visit: Payer: Self-pay | Admitting: Family Medicine

## 2016-03-15 DIAGNOSIS — I509 Heart failure, unspecified: Secondary | ICD-10-CM | POA: Diagnosis not present

## 2016-03-15 DIAGNOSIS — I739 Peripheral vascular disease, unspecified: Secondary | ICD-10-CM | POA: Diagnosis not present

## 2016-03-15 DIAGNOSIS — I110 Hypertensive heart disease with heart failure: Secondary | ICD-10-CM | POA: Diagnosis not present

## 2016-03-15 DIAGNOSIS — R32 Unspecified urinary incontinence: Secondary | ICD-10-CM | POA: Diagnosis not present

## 2016-03-15 DIAGNOSIS — J45909 Unspecified asthma, uncomplicated: Secondary | ICD-10-CM | POA: Diagnosis not present

## 2016-03-15 DIAGNOSIS — J449 Chronic obstructive pulmonary disease, unspecified: Secondary | ICD-10-CM | POA: Diagnosis not present

## 2016-03-22 ENCOUNTER — Telehealth: Payer: Self-pay | Admitting: Family Medicine

## 2016-03-22 DIAGNOSIS — J449 Chronic obstructive pulmonary disease, unspecified: Secondary | ICD-10-CM | POA: Diagnosis not present

## 2016-03-22 DIAGNOSIS — I509 Heart failure, unspecified: Secondary | ICD-10-CM | POA: Diagnosis not present

## 2016-03-22 DIAGNOSIS — R32 Unspecified urinary incontinence: Secondary | ICD-10-CM | POA: Diagnosis not present

## 2016-03-22 DIAGNOSIS — I739 Peripheral vascular disease, unspecified: Secondary | ICD-10-CM | POA: Diagnosis not present

## 2016-03-22 DIAGNOSIS — J45909 Unspecified asthma, uncomplicated: Secondary | ICD-10-CM | POA: Diagnosis not present

## 2016-03-22 DIAGNOSIS — I110 Hypertensive heart disease with heart failure: Secondary | ICD-10-CM | POA: Diagnosis not present

## 2016-03-22 NOTE — Telephone Encounter (Signed)
Can these orders be placed?

## 2016-03-22 NOTE — Telephone Encounter (Signed)
erbal order given and pt scheduled for 11/7 at 4 p.m. For follow up.

## 2016-03-22 NOTE — Telephone Encounter (Signed)
Will need to be seen. Can give orders for PT.

## 2016-03-22 NOTE — Telephone Encounter (Signed)
[facility] called and stated that pt has significantly worsening tremors with stuttering of her speech, increased weakness and staggering gait. Milld headache this morning, this has been going off and on for some time,. First time Nurse has seen this. Cindy Graham is requesting is a order for Physical therapy for safety, wondering if you evaluate or would you consider changing her atenolol to protenolol so this does have tremors. Needs orders to continue weekly visits for monitoring.  Call [facility] 336 [phone]

## 2016-03-24 DIAGNOSIS — Z23 Encounter for immunization: Secondary | ICD-10-CM | POA: Diagnosis not present

## 2016-03-25 DIAGNOSIS — I739 Peripheral vascular disease, unspecified: Secondary | ICD-10-CM | POA: Diagnosis not present

## 2016-03-25 DIAGNOSIS — M2041 Other hammer toe(s) (acquired), right foot: Secondary | ICD-10-CM | POA: Diagnosis not present

## 2016-03-25 DIAGNOSIS — Z7982 Long term (current) use of aspirin: Secondary | ICD-10-CM | POA: Diagnosis not present

## 2016-03-25 DIAGNOSIS — Z8701 Personal history of pneumonia (recurrent): Secondary | ICD-10-CM | POA: Diagnosis not present

## 2016-03-25 DIAGNOSIS — M19019 Primary osteoarthritis, unspecified shoulder: Secondary | ICD-10-CM | POA: Diagnosis not present

## 2016-03-25 DIAGNOSIS — I509 Heart failure, unspecified: Secondary | ICD-10-CM | POA: Diagnosis not present

## 2016-03-25 DIAGNOSIS — J449 Chronic obstructive pulmonary disease, unspecified: Secondary | ICD-10-CM | POA: Diagnosis not present

## 2016-03-25 DIAGNOSIS — Z9981 Dependence on supplemental oxygen: Secondary | ICD-10-CM | POA: Diagnosis not present

## 2016-03-25 DIAGNOSIS — Z8501 Personal history of malignant neoplasm of esophagus: Secondary | ICD-10-CM | POA: Diagnosis not present

## 2016-03-25 DIAGNOSIS — R32 Unspecified urinary incontinence: Secondary | ICD-10-CM | POA: Diagnosis not present

## 2016-03-25 DIAGNOSIS — I110 Hypertensive heart disease with heart failure: Secondary | ICD-10-CM | POA: Diagnosis not present

## 2016-03-25 DIAGNOSIS — J45909 Unspecified asthma, uncomplicated: Secondary | ICD-10-CM | POA: Diagnosis not present

## 2016-03-25 DIAGNOSIS — R262 Difficulty in walking, not elsewhere classified: Secondary | ICD-10-CM | POA: Diagnosis not present

## 2016-03-25 DIAGNOSIS — Z87440 Personal history of urinary (tract) infections: Secondary | ICD-10-CM | POA: Diagnosis not present

## 2016-03-25 DIAGNOSIS — K219 Gastro-esophageal reflux disease without esophagitis: Secondary | ICD-10-CM | POA: Diagnosis not present

## 2016-03-29 ENCOUNTER — Encounter: Payer: Self-pay | Admitting: Family Medicine

## 2016-03-29 ENCOUNTER — Ambulatory Visit (INDEPENDENT_AMBULATORY_CARE_PROVIDER_SITE_OTHER): Payer: Medicare Other | Admitting: Family Medicine

## 2016-03-29 VITALS — BP 110/50 | HR 69 | Temp 97.8°F | Resp 16 | Wt 180.5 lb

## 2016-03-29 DIAGNOSIS — R479 Unspecified speech disturbances: Secondary | ICD-10-CM | POA: Diagnosis not present

## 2016-03-29 DIAGNOSIS — R251 Tremor, unspecified: Secondary | ICD-10-CM

## 2016-03-29 MED ORDER — PROPRANOLOL HCL ER 60 MG PO CP24: 60.0000 mg | ORAL_CAPSULE | Freq: Every day | ORAL | 0 refills | Status: DC

## 2016-03-29 NOTE — Patient Instructions (Signed)
Stop the atenolol.  Start the propanolol.  We will call regarding the MRI.  Take care  Dr. Lacinda Axon

## 2016-03-29 NOTE — Assessment & Plan Note (Signed)
New problem. Unclear etiology. Concern for recent CVA. Arranging MRI.

## 2016-03-29 NOTE — Assessment & Plan Note (Signed)
New problem (to me). Unclear etiology. DDx - essential tremor, parkinson's/other neurological disorder. Offered neurology referral and patient states that she wants to wait. Given other symptoms, arranging for MRI. Switching atenolol to propranolol.

## 2016-03-29 NOTE — Progress Notes (Signed)
Pre visit review using our clinic review tool, if applicable. No additional management support is needed unless otherwise documented below in the visit note. 

## 2016-03-29 NOTE — Progress Notes (Signed)
Subjective:  Patient ID: Cindy Graham, female    DOB: May 03, 1922  Age: 82 y.o. MRN: [id number]  CC: Tremor  HPI:  82 year old female with a history of gait instability and chronic low back pain presents with the above complaint.  I received a note from [facility] on 10/31. Nurse stated that patient was having worsening tremor, stuttering of her speech as well as worsening gait and weakness. I instructed them to have her follow up.  Patient presents today for follow-up regarding these issues. She states that she's had a long-standing tremor. Tremor has never been documented in the chart and I have never seen significant tremor. She reports that she's been having tremor the hand particularly worse over the past few weeks. Additionally, she states that she's having difficulty getting her words out. This is evident during history today. She appears to know what she wants to say but has difficulty getting it out. She's had ongoing issues with gait instability and difficulty ambulating. No known inciting factor. No recent change in medications but she is on several medications that could be contributing. No reports of focal weakness per patient. No known exacerbating or relieving factors.   Social Hx   Social History   Social History  . Marital status: Married    Spouse name: N/A  . Number of children: N/A  . Years of education: N/A   Social History Main Topics  . Smoking status: Never Smoker  . Smokeless tobacco: Never Used  . Alcohol use No  . Drug use: No  . Sexual activity: Not Currently   Other Topics Concern  . None   Social History Narrative  . None   Review of Systems  Musculoskeletal: Positive for arthralgias and gait problem.  Neurological: Positive for tremors, speech difficulty, weakness and headaches.   Objective:  BP (!) 110/50 (BP Location: Left Arm, Patient Position: Sitting, Cuff Size: Large)   Pulse 69   Temp 97.8 F (36.6 C) (Oral)   Resp 16   Wt  180 lb 8 oz (81.9 kg)   SpO2 96%   BMI 31.97 kg/m   BP/Weight 03/29/2016 02/08/2016 [id number]  Systolic BP [id number]  Diastolic BP 50 66 70  Wt. (Lbs) 180.5 182.38 183.25  BMI 31.97 32.31 32.46   Physical Exam  Constitutional: She appears well-developed. No distress.  Cardiovascular: Normal rate and regular rhythm.   Pulmonary/Chest: Effort normal and breath sounds normal.  Neurological: She is alert.  Resting tremor noted of the right hand. Appears to be worse with intention. Stuttering of speech noted. Patient seems to have word finding difficulty.   Psychiatric: She has a normal mood and affect.  Vitals reviewed.  Lab Results  Component Value Date   WBC 7.4 11/26/2015   HGB 12.3 11/26/2015   HCT 37.8 11/26/2015   PLT 279.0 11/26/2015   GLUCOSE 95 11/26/2015   CHOL 214 (H) 07/16/2014   TRIG 207.0 (H) 07/16/2014   HDL 48.80 07/16/2014   LDLDIRECT 135.0 07/16/2014   LDLCALC 150 (H) 03/15/2013   ALT 12 11/26/2015   AST 23 11/26/2015   NA 137 11/26/2015   K 4.6 11/26/2015   CL 102 11/26/2015   CREATININE 1.15 11/26/2015   BUN 26 (H) 11/26/2015   CO2 28 11/26/2015   TSH 3.20 03/06/2015   HGBA1C 6.0 03/14/2013   MICROALBUR 0.7 01/15/2014    Assessment & Plan:   Problem List Items Addressed This Visit    Tremor - Primary  New problem (to me). Unclear etiology. DDx - essential tremor, parkinson's/other neurological disorder. Offered neurology referral and patient states that she wants to wait. Given other symptoms, arranging for MRI. Switching atenolol to propranolol.      Relevant Orders   MR Brain Wo Contrast   Difficulty with speech    New problem. Unclear etiology. Concern for recent CVA. Arranging MRI.      Relevant Orders   MR Brain Wo Contrast      Meds ordered this encounter  Medications  . propranolol ER (INDERAL LA) 60 MG 24 hr capsule    Sig: Take 1 capsule (60 mg total) by mouth daily.    Dispense:  90 capsule    Refill:  0     Follow-up: Return in about 1 month (around 04/28/2016), or if symptoms worsen or fail to improve.  [place]

## 2016-03-30 ENCOUNTER — Telehealth: Payer: Self-pay | Admitting: Family Medicine

## 2016-03-30 DIAGNOSIS — I509 Heart failure, unspecified: Secondary | ICD-10-CM | POA: Diagnosis not present

## 2016-03-30 DIAGNOSIS — I110 Hypertensive heart disease with heart failure: Secondary | ICD-10-CM | POA: Diagnosis not present

## 2016-03-30 DIAGNOSIS — I739 Peripheral vascular disease, unspecified: Secondary | ICD-10-CM | POA: Diagnosis not present

## 2016-03-30 DIAGNOSIS — J45909 Unspecified asthma, uncomplicated: Secondary | ICD-10-CM | POA: Diagnosis not present

## 2016-03-30 DIAGNOSIS — J449 Chronic obstructive pulmonary disease, unspecified: Secondary | ICD-10-CM | POA: Diagnosis not present

## 2016-03-30 DIAGNOSIS — R262 Difficulty in walking, not elsewhere classified: Secondary | ICD-10-CM | POA: Diagnosis not present

## 2016-03-30 NOTE — Telephone Encounter (Signed)
Cindy Graham from [facility] called and stated that pt was elevated at 178/78 in left arm sitting and 170/70 in right arm sitting. Everything else is the same, pt took atenolol this morning and will take the propranolol ER (INDERAL LA) 60 MG 24 hr capsule tomorrow. Pt states she changed her mind she will have MRI done.  Call [facility] @ [phone]

## 2016-03-30 NOTE — Telephone Encounter (Signed)
Continue meds as prescribed. I already ordered MRI.

## 2016-03-30 NOTE — Telephone Encounter (Signed)
Please place referral

## 2016-03-30 NOTE — Telephone Encounter (Signed)
[name] was called and told the information that PCP gave.

## 2016-03-31 DIAGNOSIS — J45909 Unspecified asthma, uncomplicated: Secondary | ICD-10-CM | POA: Diagnosis not present

## 2016-03-31 DIAGNOSIS — I509 Heart failure, unspecified: Secondary | ICD-10-CM | POA: Diagnosis not present

## 2016-03-31 DIAGNOSIS — J449 Chronic obstructive pulmonary disease, unspecified: Secondary | ICD-10-CM | POA: Diagnosis not present

## 2016-03-31 DIAGNOSIS — I739 Peripheral vascular disease, unspecified: Secondary | ICD-10-CM | POA: Diagnosis not present

## 2016-03-31 DIAGNOSIS — I110 Hypertensive heart disease with heart failure: Secondary | ICD-10-CM | POA: Diagnosis not present

## 2016-03-31 DIAGNOSIS — R262 Difficulty in walking, not elsewhere classified: Secondary | ICD-10-CM | POA: Diagnosis not present

## 2016-04-04 ENCOUNTER — Telehealth: Payer: Self-pay | Admitting: Internal Medicine

## 2016-04-04 DIAGNOSIS — J45909 Unspecified asthma, uncomplicated: Secondary | ICD-10-CM | POA: Diagnosis not present

## 2016-04-04 DIAGNOSIS — R262 Difficulty in walking, not elsewhere classified: Secondary | ICD-10-CM | POA: Diagnosis not present

## 2016-04-04 DIAGNOSIS — I739 Peripheral vascular disease, unspecified: Secondary | ICD-10-CM | POA: Diagnosis not present

## 2016-04-04 DIAGNOSIS — J449 Chronic obstructive pulmonary disease, unspecified: Secondary | ICD-10-CM | POA: Diagnosis not present

## 2016-04-04 DIAGNOSIS — I110 Hypertensive heart disease with heart failure: Secondary | ICD-10-CM | POA: Diagnosis not present

## 2016-04-04 DIAGNOSIS — I509 Heart failure, unspecified: Secondary | ICD-10-CM | POA: Diagnosis not present

## 2016-04-04 NOTE — Telephone Encounter (Signed)
Patient received recall letter and does not want to fu at this time.  Per patient request deleting recall.

## 2016-04-05 DIAGNOSIS — I739 Peripheral vascular disease, unspecified: Secondary | ICD-10-CM | POA: Diagnosis not present

## 2016-04-05 DIAGNOSIS — J45909 Unspecified asthma, uncomplicated: Secondary | ICD-10-CM | POA: Diagnosis not present

## 2016-04-05 DIAGNOSIS — R262 Difficulty in walking, not elsewhere classified: Secondary | ICD-10-CM | POA: Diagnosis not present

## 2016-04-05 DIAGNOSIS — I110 Hypertensive heart disease with heart failure: Secondary | ICD-10-CM | POA: Diagnosis not present

## 2016-04-05 DIAGNOSIS — J449 Chronic obstructive pulmonary disease, unspecified: Secondary | ICD-10-CM | POA: Diagnosis not present

## 2016-04-05 DIAGNOSIS — I509 Heart failure, unspecified: Secondary | ICD-10-CM | POA: Diagnosis not present

## 2016-04-07 DIAGNOSIS — J45909 Unspecified asthma, uncomplicated: Secondary | ICD-10-CM | POA: Diagnosis not present

## 2016-04-07 DIAGNOSIS — R262 Difficulty in walking, not elsewhere classified: Secondary | ICD-10-CM | POA: Diagnosis not present

## 2016-04-07 DIAGNOSIS — I509 Heart failure, unspecified: Secondary | ICD-10-CM | POA: Diagnosis not present

## 2016-04-07 DIAGNOSIS — I110 Hypertensive heart disease with heart failure: Secondary | ICD-10-CM | POA: Diagnosis not present

## 2016-04-07 DIAGNOSIS — I739 Peripheral vascular disease, unspecified: Secondary | ICD-10-CM | POA: Diagnosis not present

## 2016-04-07 DIAGNOSIS — J449 Chronic obstructive pulmonary disease, unspecified: Secondary | ICD-10-CM | POA: Diagnosis not present

## 2016-04-09 ENCOUNTER — Ambulatory Visit
Admission: RE | Admit: 2016-04-09 | Discharge: 2016-04-09 | Disposition: A | Discharge status: Home / Self Care | Payer: Medicare Other | Source: Ambulatory Visit | Attending: Family Medicine | Admitting: Family Medicine

## 2016-04-09 DIAGNOSIS — R479 Unspecified speech disturbances: Secondary | ICD-10-CM | POA: Insufficient documentation

## 2016-04-09 DIAGNOSIS — R251 Tremor, unspecified: Secondary | ICD-10-CM | POA: Insufficient documentation

## 2016-04-09 DIAGNOSIS — G319 Degenerative disease of nervous system, unspecified: Secondary | ICD-10-CM | POA: Diagnosis not present

## 2016-04-09 DIAGNOSIS — R51 Headache: Secondary | ICD-10-CM | POA: Diagnosis not present

## 2016-04-09 DIAGNOSIS — I739 Peripheral vascular disease, unspecified: Secondary | ICD-10-CM | POA: Diagnosis not present

## 2016-04-09 DIAGNOSIS — R930 Abnormal findings on diagnostic imaging of skull and head, not elsewhere classified: Secondary | ICD-10-CM | POA: Insufficient documentation

## 2016-04-11 DIAGNOSIS — I739 Peripheral vascular disease, unspecified: Secondary | ICD-10-CM | POA: Diagnosis not present

## 2016-04-11 DIAGNOSIS — J449 Chronic obstructive pulmonary disease, unspecified: Secondary | ICD-10-CM | POA: Diagnosis not present

## 2016-04-11 DIAGNOSIS — R262 Difficulty in walking, not elsewhere classified: Secondary | ICD-10-CM | POA: Diagnosis not present

## 2016-04-11 DIAGNOSIS — I509 Heart failure, unspecified: Secondary | ICD-10-CM | POA: Diagnosis not present

## 2016-04-11 DIAGNOSIS — I110 Hypertensive heart disease with heart failure: Secondary | ICD-10-CM | POA: Diagnosis not present

## 2016-04-11 DIAGNOSIS — J45909 Unspecified asthma, uncomplicated: Secondary | ICD-10-CM | POA: Diagnosis not present

## 2016-04-12 DIAGNOSIS — J45909 Unspecified asthma, uncomplicated: Secondary | ICD-10-CM | POA: Diagnosis not present

## 2016-04-12 DIAGNOSIS — I509 Heart failure, unspecified: Secondary | ICD-10-CM | POA: Diagnosis not present

## 2016-04-12 DIAGNOSIS — J449 Chronic obstructive pulmonary disease, unspecified: Secondary | ICD-10-CM | POA: Diagnosis not present

## 2016-04-12 DIAGNOSIS — I739 Peripheral vascular disease, unspecified: Secondary | ICD-10-CM | POA: Diagnosis not present

## 2016-04-12 DIAGNOSIS — R262 Difficulty in walking, not elsewhere classified: Secondary | ICD-10-CM | POA: Diagnosis not present

## 2016-04-12 DIAGNOSIS — I110 Hypertensive heart disease with heart failure: Secondary | ICD-10-CM | POA: Diagnosis not present

## 2016-04-13 ENCOUNTER — Other Ambulatory Visit: Payer: Self-pay | Admitting: Family Medicine

## 2016-04-13 MED ORDER — OXYBUTYNIN CHLORIDE 5 MG PO TABS: ORAL_TABLET | ORAL | 1 refills | Status: DC

## 2016-04-13 NOTE — Telephone Encounter (Signed)
Refilled 11/30/15. Pt last seen 03/29/16. Please advise?

## 2016-04-19 DIAGNOSIS — J449 Chronic obstructive pulmonary disease, unspecified: Secondary | ICD-10-CM | POA: Diagnosis not present

## 2016-04-19 DIAGNOSIS — R262 Difficulty in walking, not elsewhere classified: Secondary | ICD-10-CM | POA: Diagnosis not present

## 2016-04-19 DIAGNOSIS — I110 Hypertensive heart disease with heart failure: Secondary | ICD-10-CM | POA: Diagnosis not present

## 2016-04-19 DIAGNOSIS — J45909 Unspecified asthma, uncomplicated: Secondary | ICD-10-CM | POA: Diagnosis not present

## 2016-04-19 DIAGNOSIS — I509 Heart failure, unspecified: Secondary | ICD-10-CM | POA: Diagnosis not present

## 2016-04-19 DIAGNOSIS — I739 Peripheral vascular disease, unspecified: Secondary | ICD-10-CM | POA: Diagnosis not present

## 2016-04-20 ENCOUNTER — Telehealth: Payer: Self-pay | Admitting: Family Medicine

## 2016-04-20 DIAGNOSIS — I739 Peripheral vascular disease, unspecified: Secondary | ICD-10-CM | POA: Diagnosis not present

## 2016-04-20 DIAGNOSIS — J45909 Unspecified asthma, uncomplicated: Secondary | ICD-10-CM | POA: Diagnosis not present

## 2016-04-20 DIAGNOSIS — J449 Chronic obstructive pulmonary disease, unspecified: Secondary | ICD-10-CM | POA: Diagnosis not present

## 2016-04-20 DIAGNOSIS — I509 Heart failure, unspecified: Secondary | ICD-10-CM | POA: Diagnosis not present

## 2016-04-20 DIAGNOSIS — R262 Difficulty in walking, not elsewhere classified: Secondary | ICD-10-CM | POA: Diagnosis not present

## 2016-04-20 DIAGNOSIS — I110 Hypertensive heart disease with heart failure: Secondary | ICD-10-CM | POA: Diagnosis not present

## 2016-04-20 NOTE — Telephone Encounter (Signed)
Cindy Graham from [facility] called pt stated that you called with her MRI results. Pt did not understand the results just knew that there was no cancer. Cindy Graham would like you to call her with the results so that she can explain them to the pt for better understanding. Thank you!  Call [facility] @ [phone]

## 2016-04-20 NOTE — Telephone Encounter (Addendum)
Called [name] gave her the finding of her MRI per Pt request. She is going to inform pt due to pt confusion when first given results. [name] stated they switched her to propranolol and she has since stopped having tremors.

## 2016-04-20 NOTE — Telephone Encounter (Signed)
Pt called to cancel appt pt states she has too many appts. Thank you!

## 2016-04-21 NOTE — Telephone Encounter (Signed)
MRI results faxed per Pt request.

## 2016-04-21 NOTE — Telephone Encounter (Signed)
Pt called and asked if we could send [name] the MRI report.

## 2016-04-22 DIAGNOSIS — J45909 Unspecified asthma, uncomplicated: Secondary | ICD-10-CM | POA: Diagnosis not present

## 2016-04-22 DIAGNOSIS — I509 Heart failure, unspecified: Secondary | ICD-10-CM | POA: Diagnosis not present

## 2016-04-22 DIAGNOSIS — I110 Hypertensive heart disease with heart failure: Secondary | ICD-10-CM | POA: Diagnosis not present

## 2016-04-22 DIAGNOSIS — I739 Peripheral vascular disease, unspecified: Secondary | ICD-10-CM | POA: Diagnosis not present

## 2016-04-22 DIAGNOSIS — J449 Chronic obstructive pulmonary disease, unspecified: Secondary | ICD-10-CM | POA: Diagnosis not present

## 2016-04-22 DIAGNOSIS — R262 Difficulty in walking, not elsewhere classified: Secondary | ICD-10-CM | POA: Diagnosis not present

## 2016-04-26 DIAGNOSIS — I509 Heart failure, unspecified: Secondary | ICD-10-CM | POA: Diagnosis not present

## 2016-04-26 DIAGNOSIS — J449 Chronic obstructive pulmonary disease, unspecified: Secondary | ICD-10-CM | POA: Diagnosis not present

## 2016-04-26 DIAGNOSIS — J45909 Unspecified asthma, uncomplicated: Secondary | ICD-10-CM | POA: Diagnosis not present

## 2016-04-26 DIAGNOSIS — R262 Difficulty in walking, not elsewhere classified: Secondary | ICD-10-CM | POA: Diagnosis not present

## 2016-04-26 DIAGNOSIS — I739 Peripheral vascular disease, unspecified: Secondary | ICD-10-CM | POA: Diagnosis not present

## 2016-04-26 DIAGNOSIS — I110 Hypertensive heart disease with heart failure: Secondary | ICD-10-CM | POA: Diagnosis not present

## 2016-04-27 ENCOUNTER — Ambulatory Visit: Payer: Medicare Other | Admitting: Family Medicine

## 2016-05-05 ENCOUNTER — Encounter: Payer: Self-pay | Admitting: Family Medicine

## 2016-05-05 ENCOUNTER — Ambulatory Visit (INDEPENDENT_AMBULATORY_CARE_PROVIDER_SITE_OTHER): Payer: Medicare Other | Admitting: Family Medicine

## 2016-05-05 DIAGNOSIS — R21 Rash and other nonspecific skin eruption: Secondary | ICD-10-CM | POA: Diagnosis not present

## 2016-05-05 DIAGNOSIS — R479 Unspecified speech disturbances: Secondary | ICD-10-CM

## 2016-05-05 DIAGNOSIS — R251 Tremor, unspecified: Secondary | ICD-10-CM | POA: Diagnosis not present

## 2016-05-05 MED ORDER — NYSTATIN 100000 UNIT/GM EX CREA
1.0000 | TOPICAL_CREAM | Freq: Two times a day (BID) | CUTANEOUS | 6 refills | Status: DC
Start: 2016-05-05 — End: 2017-07-10

## 2016-05-05 NOTE — Patient Instructions (Signed)
I'm glad you're doing well.  Continue your meds.  I have refilled the cream.  Take care  Follow up in 3-6 months  Dr. Lacinda Axon

## 2016-05-05 NOTE — Progress Notes (Signed)
   Subjective:  Patient ID: Cindy Graham, female    DOB: 12/01/1921  Age: 82 y.o. MRN: [id number]  CC: Follow up   HPI:  82 year old female with an extensive past medical history presents for follow-up.  Tremor  Patient states that her tremor has been improving after switching her medication to propranolol.  She states that she seems to be doing well at this time.  Speech difficulty  Improving.  She states she is doing well.  She had an unremarkable MRI.  Rash  Patient has recurrent intertrigo.  She currently has a worsening rash underneath her breasts and on her bottom.  She states that she's had little improvement with nystatin powder.  She is requesting nystatin cream today.  Social Hx   Social History   Social History  . Marital status: Married    Spouse name: N/A  . Number of children: N/A  . Years of education: N/A   Social History Main Topics  . Smoking status: Never Smoker  . Smokeless tobacco: Never Used  . Alcohol use No  . Drug use: No  . Sexual activity: Not Currently   Other Topics Concern  . None   Social History Narrative  . None    Review of Systems  Constitutional: Negative.   Skin: Positive for rash.   Objective:  BP (!) 148/77 (BP Location: Left Arm, Patient Position: Sitting, Cuff Size: Large)   Pulse 72   Temp 97.6 F (36.4 C) (Oral)   Resp 16   Wt 181 lb 8 oz (82.3 kg)   SpO2 92%   BMI 32.15 kg/m   BP/Weight 05/05/2016 03/29/2016 [id number]  Systolic BP 123456 [id number]  Diastolic BP 77 50 66  Wt. (Lbs) 181.5 180.5 182.38  BMI 32.15 31.97 32.31   Physical Exam  Constitutional: She appears well-developed. No distress.  Cardiovascular: Normal rate and regular rhythm.   Pulmonary/Chest: Effort normal and breath sounds normal.  Neurological: She is alert.  Skin: Rash noted.  Psychiatric: She has a normal mood and affect.  Vitals reviewed.  Lab Results  Component Value Date   WBC 7.4 11/26/2015   HGB 12.3 11/26/2015     HCT 37.8 11/26/2015   PLT 279.0 11/26/2015   GLUCOSE 95 11/26/2015   CHOL 214 (H) 07/16/2014   TRIG 207.0 (H) 07/16/2014   HDL 48.80 07/16/2014   LDLDIRECT 135.0 07/16/2014   LDLCALC 150 (H) 03/15/2013   ALT 12 11/26/2015   AST 23 11/26/2015   NA 137 11/26/2015   K 4.6 11/26/2015   CL 102 11/26/2015   CREATININE 1.15 11/26/2015   BUN 26 (H) 11/26/2015   CO2 28 11/26/2015   TSH 3.20 03/06/2015   HGBA1C 6.0 03/14/2013   MICROALBUR 0.7 01/15/2014    Assessment & Plan:   Problem List Items Addressed This Visit    Tremor    Established problem, improving. Continue Inderal.      Rash    New problem. Secondary to use. Treating with nystatin.      Difficulty with speech    Established problem, improving. We'll continue to monitor closely.         Meds ordered this encounter  Medications  . nystatin cream (MYCOSTATIN)    Sig: Apply 1 application topically 2 (two) times daily.    Dispense:  30 g    Refill:  6    Follow-up: 3-6 months.  [place]

## 2016-05-05 NOTE — Progress Notes (Signed)
Pre visit review using our clinic review tool, if applicable. No additional management support is needed unless otherwise documented below in the visit note. 

## 2016-05-05 NOTE — Assessment & Plan Note (Signed)
New problem. Secondary to use. Treating with nystatin.

## 2016-05-05 NOTE — Assessment & Plan Note (Signed)
Established problem, improving. Continue Inderal.

## 2016-05-05 NOTE — Assessment & Plan Note (Addendum)
Established problem, improving. We'll continue to monitor closely.

## 2016-05-06 ENCOUNTER — Telehealth: Payer: Self-pay | Admitting: Family Medicine

## 2016-05-06 DIAGNOSIS — J449 Chronic obstructive pulmonary disease, unspecified: Secondary | ICD-10-CM | POA: Diagnosis not present

## 2016-05-06 DIAGNOSIS — R262 Difficulty in walking, not elsewhere classified: Secondary | ICD-10-CM | POA: Diagnosis not present

## 2016-05-06 DIAGNOSIS — I110 Hypertensive heart disease with heart failure: Secondary | ICD-10-CM | POA: Diagnosis not present

## 2016-05-06 DIAGNOSIS — J45909 Unspecified asthma, uncomplicated: Secondary | ICD-10-CM | POA: Diagnosis not present

## 2016-05-06 DIAGNOSIS — I739 Peripheral vascular disease, unspecified: Secondary | ICD-10-CM | POA: Diagnosis not present

## 2016-05-06 DIAGNOSIS — I509 Heart failure, unspecified: Secondary | ICD-10-CM | POA: Diagnosis not present

## 2016-05-06 NOTE — Telephone Encounter (Signed)
[name] a nurses from [facility] called and stated that pt's oxygen rest 87-88. She does not her any sounds in her lungs. She put pt on oxygen during the day and her O2 stats went up to 97. She would like orders to have pt evaluated tomorrow. Please advise, thank you!  Call [facility] [phone]

## 2016-05-06 NOTE — Telephone Encounter (Signed)
Sats in office were good. Unsure of cause. Recommend rechecking and with different device.

## 2016-05-06 NOTE — Telephone Encounter (Signed)
Cindy Graham was told to recheck and she stated that she wonders if it has to do with her checking her O2 sats at 8 a.m.

## 2016-05-07 DIAGNOSIS — J45909 Unspecified asthma, uncomplicated: Secondary | ICD-10-CM | POA: Diagnosis not present

## 2016-05-07 DIAGNOSIS — I509 Heart failure, unspecified: Secondary | ICD-10-CM | POA: Diagnosis not present

## 2016-05-07 DIAGNOSIS — R262 Difficulty in walking, not elsewhere classified: Secondary | ICD-10-CM | POA: Diagnosis not present

## 2016-05-07 DIAGNOSIS — I110 Hypertensive heart disease with heart failure: Secondary | ICD-10-CM | POA: Diagnosis not present

## 2016-05-07 DIAGNOSIS — I739 Peripheral vascular disease, unspecified: Secondary | ICD-10-CM | POA: Diagnosis not present

## 2016-05-07 DIAGNOSIS — J449 Chronic obstructive pulmonary disease, unspecified: Secondary | ICD-10-CM | POA: Diagnosis not present

## 2016-05-13 DIAGNOSIS — J45909 Unspecified asthma, uncomplicated: Secondary | ICD-10-CM | POA: Diagnosis not present

## 2016-05-13 DIAGNOSIS — I509 Heart failure, unspecified: Secondary | ICD-10-CM | POA: Diagnosis not present

## 2016-05-13 DIAGNOSIS — J449 Chronic obstructive pulmonary disease, unspecified: Secondary | ICD-10-CM | POA: Diagnosis not present

## 2016-05-13 DIAGNOSIS — R262 Difficulty in walking, not elsewhere classified: Secondary | ICD-10-CM | POA: Diagnosis not present

## 2016-05-13 DIAGNOSIS — I739 Peripheral vascular disease, unspecified: Secondary | ICD-10-CM | POA: Diagnosis not present

## 2016-05-13 DIAGNOSIS — I110 Hypertensive heart disease with heart failure: Secondary | ICD-10-CM | POA: Diagnosis not present

## 2016-05-20 ENCOUNTER — Telehealth: Payer: Self-pay | Admitting: Family Medicine

## 2016-05-20 DIAGNOSIS — R262 Difficulty in walking, not elsewhere classified: Secondary | ICD-10-CM | POA: Diagnosis not present

## 2016-05-20 DIAGNOSIS — I739 Peripheral vascular disease, unspecified: Secondary | ICD-10-CM | POA: Diagnosis not present

## 2016-05-20 DIAGNOSIS — J449 Chronic obstructive pulmonary disease, unspecified: Secondary | ICD-10-CM | POA: Diagnosis not present

## 2016-05-20 DIAGNOSIS — I509 Heart failure, unspecified: Secondary | ICD-10-CM | POA: Diagnosis not present

## 2016-05-20 DIAGNOSIS — I110 Hypertensive heart disease with heart failure: Secondary | ICD-10-CM | POA: Diagnosis not present

## 2016-05-20 DIAGNOSIS — J45909 Unspecified asthma, uncomplicated: Secondary | ICD-10-CM | POA: Diagnosis not present

## 2016-05-20 NOTE — Telephone Encounter (Signed)
Called Linda back and told her per PCP if patient is not having urinary symptoms such as: frequency, urgency, or burning a urine was not necessary. She asked if she started having symptoms of that nature could she have a order she was given the okay for urine if patient experiences symptoms. She was told the monitor BP and if it was still elevated to call and schedule appointment with Dr.Cook

## 2016-05-20 NOTE — Telephone Encounter (Signed)
[name] from [facility] called and stated that pt had episode of confusion last night, last time this happened she had a UTI.  She cannot get her to urinar while she is there. Would like to know i she could get an order for a urine sample.Also pt's bp is elevated today 172/74 sitting and 180/80 standing no symptoms, if urine sample is ordered she will check bp again tomorrow. Please advise, thank you!  Call [facility] @[phone]

## 2016-05-21 DIAGNOSIS — J45909 Unspecified asthma, uncomplicated: Secondary | ICD-10-CM | POA: Diagnosis not present

## 2016-05-21 DIAGNOSIS — I110 Hypertensive heart disease with heart failure: Secondary | ICD-10-CM | POA: Diagnosis not present

## 2016-05-21 DIAGNOSIS — I509 Heart failure, unspecified: Secondary | ICD-10-CM | POA: Diagnosis not present

## 2016-05-21 DIAGNOSIS — J449 Chronic obstructive pulmonary disease, unspecified: Secondary | ICD-10-CM | POA: Diagnosis not present

## 2016-05-21 DIAGNOSIS — I739 Peripheral vascular disease, unspecified: Secondary | ICD-10-CM | POA: Diagnosis not present

## 2016-05-21 DIAGNOSIS — R262 Difficulty in walking, not elsewhere classified: Secondary | ICD-10-CM | POA: Diagnosis not present

## 2016-05-24 DIAGNOSIS — Z8701 Personal history of pneumonia (recurrent): Secondary | ICD-10-CM | POA: Diagnosis not present

## 2016-05-24 DIAGNOSIS — M2041 Other hammer toe(s) (acquired), right foot: Secondary | ICD-10-CM | POA: Diagnosis not present

## 2016-05-24 DIAGNOSIS — I509 Heart failure, unspecified: Secondary | ICD-10-CM | POA: Diagnosis not present

## 2016-05-24 DIAGNOSIS — Z7982 Long term (current) use of aspirin: Secondary | ICD-10-CM | POA: Diagnosis not present

## 2016-05-24 DIAGNOSIS — Z9981 Dependence on supplemental oxygen: Secondary | ICD-10-CM | POA: Diagnosis not present

## 2016-05-24 DIAGNOSIS — J45909 Unspecified asthma, uncomplicated: Secondary | ICD-10-CM | POA: Diagnosis not present

## 2016-05-24 DIAGNOSIS — K219 Gastro-esophageal reflux disease without esophagitis: Secondary | ICD-10-CM | POA: Diagnosis not present

## 2016-05-24 DIAGNOSIS — J449 Chronic obstructive pulmonary disease, unspecified: Secondary | ICD-10-CM | POA: Diagnosis not present

## 2016-05-24 DIAGNOSIS — R32 Unspecified urinary incontinence: Secondary | ICD-10-CM | POA: Diagnosis not present

## 2016-05-24 DIAGNOSIS — I110 Hypertensive heart disease with heart failure: Secondary | ICD-10-CM | POA: Diagnosis not present

## 2016-05-24 DIAGNOSIS — I739 Peripheral vascular disease, unspecified: Secondary | ICD-10-CM | POA: Diagnosis not present

## 2016-05-24 DIAGNOSIS — M19019 Primary osteoarthritis, unspecified shoulder: Secondary | ICD-10-CM | POA: Diagnosis not present

## 2016-05-24 DIAGNOSIS — Z87440 Personal history of urinary (tract) infections: Secondary | ICD-10-CM | POA: Diagnosis not present

## 2016-05-24 DIAGNOSIS — Z8501 Personal history of malignant neoplasm of esophagus: Secondary | ICD-10-CM | POA: Diagnosis not present

## 2016-05-30 ENCOUNTER — Telehealth: Payer: Self-pay | Admitting: *Deleted

## 2016-05-30 DIAGNOSIS — K219 Gastro-esophageal reflux disease without esophagitis: Secondary | ICD-10-CM | POA: Diagnosis not present

## 2016-05-30 DIAGNOSIS — I110 Hypertensive heart disease with heart failure: Secondary | ICD-10-CM | POA: Diagnosis not present

## 2016-05-30 DIAGNOSIS — I739 Peripheral vascular disease, unspecified: Secondary | ICD-10-CM | POA: Diagnosis not present

## 2016-05-30 DIAGNOSIS — J45909 Unspecified asthma, uncomplicated: Secondary | ICD-10-CM | POA: Diagnosis not present

## 2016-05-30 DIAGNOSIS — I509 Heart failure, unspecified: Secondary | ICD-10-CM | POA: Diagnosis not present

## 2016-05-30 DIAGNOSIS — Z87440 Personal history of urinary (tract) infections: Secondary | ICD-10-CM | POA: Diagnosis not present

## 2016-05-30 DIAGNOSIS — R32 Unspecified urinary incontinence: Secondary | ICD-10-CM | POA: Diagnosis not present

## 2016-05-30 DIAGNOSIS — J449 Chronic obstructive pulmonary disease, unspecified: Secondary | ICD-10-CM | POA: Diagnosis not present

## 2016-05-30 NOTE — Telephone Encounter (Signed)
Linda Click from [facility] reported that patient has burning with urination, frequency as if bladder is not being empted. [facility] has collected a specimen because patient is symptomatic.  Linda requested to have a antibiotic called into the pharmacy (Cipro). Pt also cut her hand over the weekend, resulting ina skin tare, [name] requested consent to use [facility] to treat pt, dure to patient no being able to get out because of son's death on 15-Sep-2022. [employer]  [id number]

## 2016-05-30 NOTE — Telephone Encounter (Signed)
Did they obtain a culture. What were the results of the urine. Okay for verbal regarding care protocol/wound care.

## 2016-05-30 NOTE — Telephone Encounter (Signed)
Cindy Graham was called and asked if she had results from urine dip. She stated that she dropped off sample at [facility] and will not have results till tomorrow. Cindy Graham was told that a medication can not be prescribed without results of urine sample.

## 2016-05-30 NOTE — Telephone Encounter (Signed)
Recommendations?

## 2016-06-01 ENCOUNTER — Telehealth: Payer: Self-pay | Admitting: *Deleted

## 2016-06-01 ENCOUNTER — Other Ambulatory Visit: Payer: Self-pay | Admitting: Family Medicine

## 2016-06-01 MED ORDER — CIPROFLOXACIN HCL 250 MG PO TABS: 250.0000 mg | ORAL_TABLET | Freq: Two times a day (BID) | ORAL | 0 refills | Status: DC

## 2016-06-01 NOTE — Telephone Encounter (Signed)
Pt was called and informed of antibiotic at pharmacy.

## 2016-06-01 NOTE — Telephone Encounter (Signed)
Spoke with Cindy Graham she stated that she has not heard from [facility] on pts urine results. Cindy Graham will be contacting them to see if they have gotten those results back.

## 2016-06-01 NOTE — Telephone Encounter (Signed)
Urine results were faxed over and PCP was able to review.

## 2016-06-01 NOTE — Telephone Encounter (Signed)
Patient requested a update specimen

## 2016-06-01 NOTE — Telephone Encounter (Signed)
Rx sent 

## 2016-06-02 DIAGNOSIS — I739 Peripheral vascular disease, unspecified: Secondary | ICD-10-CM | POA: Diagnosis not present

## 2016-06-02 DIAGNOSIS — J449 Chronic obstructive pulmonary disease, unspecified: Secondary | ICD-10-CM | POA: Diagnosis not present

## 2016-06-02 DIAGNOSIS — I509 Heart failure, unspecified: Secondary | ICD-10-CM | POA: Diagnosis not present

## 2016-06-02 DIAGNOSIS — K219 Gastro-esophageal reflux disease without esophagitis: Secondary | ICD-10-CM | POA: Diagnosis not present

## 2016-06-02 DIAGNOSIS — I110 Hypertensive heart disease with heart failure: Secondary | ICD-10-CM | POA: Diagnosis not present

## 2016-06-02 DIAGNOSIS — J45909 Unspecified asthma, uncomplicated: Secondary | ICD-10-CM | POA: Diagnosis not present

## 2016-06-05 DIAGNOSIS — J449 Chronic obstructive pulmonary disease, unspecified: Secondary | ICD-10-CM | POA: Diagnosis not present

## 2016-06-05 DIAGNOSIS — J45909 Unspecified asthma, uncomplicated: Secondary | ICD-10-CM | POA: Diagnosis not present

## 2016-06-05 DIAGNOSIS — I739 Peripheral vascular disease, unspecified: Secondary | ICD-10-CM | POA: Diagnosis not present

## 2016-06-05 DIAGNOSIS — I509 Heart failure, unspecified: Secondary | ICD-10-CM | POA: Diagnosis not present

## 2016-06-05 DIAGNOSIS — I110 Hypertensive heart disease with heart failure: Secondary | ICD-10-CM | POA: Diagnosis not present

## 2016-06-05 DIAGNOSIS — K219 Gastro-esophageal reflux disease without esophagitis: Secondary | ICD-10-CM | POA: Diagnosis not present

## 2016-06-06 ENCOUNTER — Encounter: Payer: Self-pay | Admitting: Family Medicine

## 2016-06-06 NOTE — Progress Notes (Unsigned)
error 

## 2016-06-10 DIAGNOSIS — J45909 Unspecified asthma, uncomplicated: Secondary | ICD-10-CM | POA: Diagnosis not present

## 2016-06-10 DIAGNOSIS — K219 Gastro-esophageal reflux disease without esophagitis: Secondary | ICD-10-CM | POA: Diagnosis not present

## 2016-06-10 DIAGNOSIS — J449 Chronic obstructive pulmonary disease, unspecified: Secondary | ICD-10-CM | POA: Diagnosis not present

## 2016-06-10 DIAGNOSIS — I509 Heart failure, unspecified: Secondary | ICD-10-CM | POA: Diagnosis not present

## 2016-06-10 DIAGNOSIS — I739 Peripheral vascular disease, unspecified: Secondary | ICD-10-CM | POA: Diagnosis not present

## 2016-06-10 DIAGNOSIS — I110 Hypertensive heart disease with heart failure: Secondary | ICD-10-CM | POA: Diagnosis not present

## 2016-06-15 DIAGNOSIS — J449 Chronic obstructive pulmonary disease, unspecified: Secondary | ICD-10-CM | POA: Diagnosis not present

## 2016-06-15 DIAGNOSIS — K219 Gastro-esophageal reflux disease without esophagitis: Secondary | ICD-10-CM | POA: Diagnosis not present

## 2016-06-15 DIAGNOSIS — I509 Heart failure, unspecified: Secondary | ICD-10-CM | POA: Diagnosis not present

## 2016-06-15 DIAGNOSIS — I110 Hypertensive heart disease with heart failure: Secondary | ICD-10-CM | POA: Diagnosis not present

## 2016-06-15 DIAGNOSIS — I739 Peripheral vascular disease, unspecified: Secondary | ICD-10-CM | POA: Diagnosis not present

## 2016-06-15 DIAGNOSIS — J45909 Unspecified asthma, uncomplicated: Secondary | ICD-10-CM | POA: Diagnosis not present

## 2016-06-20 ENCOUNTER — Other Ambulatory Visit: Payer: Self-pay | Admitting: Family Medicine

## 2016-06-20 NOTE — Telephone Encounter (Signed)
Refilled 03/29/16. Pt last seen 03/29/16. Please advise this is for pt tremors.

## 2016-06-22 ENCOUNTER — Telehealth: Payer: Self-pay | Admitting: Family Medicine

## 2016-06-22 DIAGNOSIS — I739 Peripheral vascular disease, unspecified: Secondary | ICD-10-CM | POA: Diagnosis not present

## 2016-06-22 DIAGNOSIS — J449 Chronic obstructive pulmonary disease, unspecified: Secondary | ICD-10-CM | POA: Diagnosis not present

## 2016-06-22 DIAGNOSIS — J45909 Unspecified asthma, uncomplicated: Secondary | ICD-10-CM | POA: Diagnosis not present

## 2016-06-22 DIAGNOSIS — K219 Gastro-esophageal reflux disease without esophagitis: Secondary | ICD-10-CM | POA: Diagnosis not present

## 2016-06-22 DIAGNOSIS — I509 Heart failure, unspecified: Secondary | ICD-10-CM | POA: Diagnosis not present

## 2016-06-22 DIAGNOSIS — I110 Hypertensive heart disease with heart failure: Secondary | ICD-10-CM | POA: Diagnosis not present

## 2016-06-22 NOTE — Telephone Encounter (Signed)
Pt declined appt. Thank you!

## 2016-06-26 DIAGNOSIS — I509 Heart failure, unspecified: Secondary | ICD-10-CM | POA: Diagnosis not present

## 2016-06-26 DIAGNOSIS — K219 Gastro-esophageal reflux disease without esophagitis: Secondary | ICD-10-CM | POA: Diagnosis not present

## 2016-06-26 DIAGNOSIS — I739 Peripheral vascular disease, unspecified: Secondary | ICD-10-CM | POA: Diagnosis not present

## 2016-06-26 DIAGNOSIS — J45909 Unspecified asthma, uncomplicated: Secondary | ICD-10-CM | POA: Diagnosis not present

## 2016-06-26 DIAGNOSIS — J449 Chronic obstructive pulmonary disease, unspecified: Secondary | ICD-10-CM | POA: Diagnosis not present

## 2016-06-26 DIAGNOSIS — I110 Hypertensive heart disease with heart failure: Secondary | ICD-10-CM | POA: Diagnosis not present

## 2016-07-06 DIAGNOSIS — I509 Heart failure, unspecified: Secondary | ICD-10-CM | POA: Diagnosis not present

## 2016-07-06 DIAGNOSIS — I110 Hypertensive heart disease with heart failure: Secondary | ICD-10-CM | POA: Diagnosis not present

## 2016-07-06 DIAGNOSIS — J45909 Unspecified asthma, uncomplicated: Secondary | ICD-10-CM | POA: Diagnosis not present

## 2016-07-06 DIAGNOSIS — I739 Peripheral vascular disease, unspecified: Secondary | ICD-10-CM | POA: Diagnosis not present

## 2016-07-06 DIAGNOSIS — J449 Chronic obstructive pulmonary disease, unspecified: Secondary | ICD-10-CM | POA: Diagnosis not present

## 2016-07-06 DIAGNOSIS — K219 Gastro-esophageal reflux disease without esophagitis: Secondary | ICD-10-CM | POA: Diagnosis not present

## 2016-07-13 ENCOUNTER — Ambulatory Visit: Payer: Medicare Other

## 2016-07-14 DIAGNOSIS — I110 Hypertensive heart disease with heart failure: Secondary | ICD-10-CM | POA: Diagnosis not present

## 2016-07-14 DIAGNOSIS — K219 Gastro-esophageal reflux disease without esophagitis: Secondary | ICD-10-CM | POA: Diagnosis not present

## 2016-07-14 DIAGNOSIS — J45909 Unspecified asthma, uncomplicated: Secondary | ICD-10-CM | POA: Diagnosis not present

## 2016-07-14 DIAGNOSIS — I509 Heart failure, unspecified: Secondary | ICD-10-CM | POA: Diagnosis not present

## 2016-07-14 DIAGNOSIS — J449 Chronic obstructive pulmonary disease, unspecified: Secondary | ICD-10-CM | POA: Diagnosis not present

## 2016-07-14 DIAGNOSIS — I739 Peripheral vascular disease, unspecified: Secondary | ICD-10-CM | POA: Diagnosis not present

## 2016-07-19 ENCOUNTER — Telehealth: Payer: Self-pay | Admitting: *Deleted

## 2016-07-19 DIAGNOSIS — I110 Hypertensive heart disease with heart failure: Secondary | ICD-10-CM | POA: Diagnosis not present

## 2016-07-19 DIAGNOSIS — J45909 Unspecified asthma, uncomplicated: Secondary | ICD-10-CM | POA: Diagnosis not present

## 2016-07-19 DIAGNOSIS — I739 Peripheral vascular disease, unspecified: Secondary | ICD-10-CM | POA: Diagnosis not present

## 2016-07-19 DIAGNOSIS — I509 Heart failure, unspecified: Secondary | ICD-10-CM | POA: Diagnosis not present

## 2016-07-19 DIAGNOSIS — K219 Gastro-esophageal reflux disease without esophagitis: Secondary | ICD-10-CM | POA: Diagnosis not present

## 2016-07-19 DIAGNOSIS — J449 Chronic obstructive pulmonary disease, unspecified: Secondary | ICD-10-CM | POA: Diagnosis not present

## 2016-07-19 NOTE — Telephone Encounter (Signed)
[facility] reported pt having incontinence 2 times this week, this is not normal for pt.Pt has symptoms of urinary urgency and frequency with burning and pain. [name] requested a clean catch urinalysis and C&S. Claudette Head [phone]

## 2016-07-20 ENCOUNTER — Encounter: Payer: Self-pay | Admitting: Family Medicine

## 2016-07-20 DIAGNOSIS — I739 Peripheral vascular disease, unspecified: Secondary | ICD-10-CM | POA: Diagnosis not present

## 2016-07-20 DIAGNOSIS — I509 Heart failure, unspecified: Secondary | ICD-10-CM | POA: Diagnosis not present

## 2016-07-20 DIAGNOSIS — I110 Hypertensive heart disease with heart failure: Secondary | ICD-10-CM | POA: Diagnosis not present

## 2016-07-20 DIAGNOSIS — J45909 Unspecified asthma, uncomplicated: Secondary | ICD-10-CM | POA: Diagnosis not present

## 2016-07-20 DIAGNOSIS — J449 Chronic obstructive pulmonary disease, unspecified: Secondary | ICD-10-CM | POA: Diagnosis not present

## 2016-07-20 DIAGNOSIS — R350 Frequency of micturition: Secondary | ICD-10-CM | POA: Diagnosis not present

## 2016-07-20 DIAGNOSIS — Z87448 Personal history of other diseases of urinary system: Secondary | ICD-10-CM | POA: Diagnosis not present

## 2016-07-20 DIAGNOSIS — K219 Gastro-esophageal reflux disease without esophagitis: Secondary | ICD-10-CM | POA: Diagnosis not present

## 2016-07-20 DIAGNOSIS — R5383 Other fatigue: Secondary | ICD-10-CM | POA: Diagnosis not present

## 2016-07-20 NOTE — Telephone Encounter (Signed)
Cindy Graham was called and given verbal order for urine and culture.

## 2016-07-20 NOTE — Telephone Encounter (Signed)
Do you want to give orders or have her seen?

## 2016-07-20 NOTE — Telephone Encounter (Signed)
Okay for verbal 

## 2016-07-22 ENCOUNTER — Telehealth: Payer: Self-pay | Admitting: Family Medicine

## 2016-07-22 ENCOUNTER — Other Ambulatory Visit: Payer: Self-pay | Admitting: Family Medicine

## 2016-07-22 MED ORDER — CIPROFLOXACIN HCL 250 MG PO TABS: 250.0000 mg | ORAL_TABLET | Freq: Two times a day (BID) | ORAL | 0 refills | Status: DC

## 2016-07-22 NOTE — Telephone Encounter (Signed)
Cindy Graham from [place] called and stated that pt's u/a came back abnormal. They stated that if are going to call something in to please call the office to let them know. Thank you!  Call [facility] @ [phone]

## 2016-07-23 DIAGNOSIS — I739 Peripheral vascular disease, unspecified: Secondary | ICD-10-CM | POA: Diagnosis not present

## 2016-07-23 DIAGNOSIS — J449 Chronic obstructive pulmonary disease, unspecified: Secondary | ICD-10-CM | POA: Diagnosis not present

## 2016-07-23 DIAGNOSIS — Z7982 Long term (current) use of aspirin: Secondary | ICD-10-CM | POA: Diagnosis not present

## 2016-07-23 DIAGNOSIS — Z9981 Dependence on supplemental oxygen: Secondary | ICD-10-CM | POA: Diagnosis not present

## 2016-07-23 DIAGNOSIS — Z8501 Personal history of malignant neoplasm of esophagus: Secondary | ICD-10-CM | POA: Diagnosis not present

## 2016-07-23 DIAGNOSIS — M19019 Primary osteoarthritis, unspecified shoulder: Secondary | ICD-10-CM | POA: Diagnosis not present

## 2016-07-23 DIAGNOSIS — Z8701 Personal history of pneumonia (recurrent): Secondary | ICD-10-CM | POA: Diagnosis not present

## 2016-07-23 DIAGNOSIS — Z87440 Personal history of urinary (tract) infections: Secondary | ICD-10-CM | POA: Diagnosis not present

## 2016-07-23 DIAGNOSIS — M2041 Other hammer toe(s) (acquired), right foot: Secondary | ICD-10-CM | POA: Diagnosis not present

## 2016-07-23 DIAGNOSIS — I509 Heart failure, unspecified: Secondary | ICD-10-CM | POA: Diagnosis not present

## 2016-07-23 DIAGNOSIS — R32 Unspecified urinary incontinence: Secondary | ICD-10-CM | POA: Diagnosis not present

## 2016-07-23 DIAGNOSIS — I110 Hypertensive heart disease with heart failure: Secondary | ICD-10-CM | POA: Diagnosis not present

## 2016-07-23 DIAGNOSIS — K219 Gastro-esophageal reflux disease without esophagitis: Secondary | ICD-10-CM | POA: Diagnosis not present

## 2016-07-25 DIAGNOSIS — I739 Peripheral vascular disease, unspecified: Secondary | ICD-10-CM | POA: Diagnosis not present

## 2016-07-25 DIAGNOSIS — I110 Hypertensive heart disease with heart failure: Secondary | ICD-10-CM | POA: Diagnosis not present

## 2016-07-25 DIAGNOSIS — K219 Gastro-esophageal reflux disease without esophagitis: Secondary | ICD-10-CM | POA: Diagnosis not present

## 2016-07-25 DIAGNOSIS — I509 Heart failure, unspecified: Secondary | ICD-10-CM | POA: Diagnosis not present

## 2016-07-25 DIAGNOSIS — J449 Chronic obstructive pulmonary disease, unspecified: Secondary | ICD-10-CM | POA: Diagnosis not present

## 2016-07-25 DIAGNOSIS — Z87440 Personal history of urinary (tract) infections: Secondary | ICD-10-CM | POA: Diagnosis not present

## 2016-07-29 DIAGNOSIS — I509 Heart failure, unspecified: Secondary | ICD-10-CM | POA: Diagnosis not present

## 2016-07-29 DIAGNOSIS — K219 Gastro-esophageal reflux disease without esophagitis: Secondary | ICD-10-CM | POA: Diagnosis not present

## 2016-07-29 DIAGNOSIS — I739 Peripheral vascular disease, unspecified: Secondary | ICD-10-CM | POA: Diagnosis not present

## 2016-07-29 DIAGNOSIS — J449 Chronic obstructive pulmonary disease, unspecified: Secondary | ICD-10-CM | POA: Diagnosis not present

## 2016-07-29 DIAGNOSIS — I110 Hypertensive heart disease with heart failure: Secondary | ICD-10-CM | POA: Diagnosis not present

## 2016-07-29 DIAGNOSIS — Z87440 Personal history of urinary (tract) infections: Secondary | ICD-10-CM | POA: Diagnosis not present

## 2016-08-02 DIAGNOSIS — J449 Chronic obstructive pulmonary disease, unspecified: Secondary | ICD-10-CM | POA: Diagnosis not present

## 2016-08-02 DIAGNOSIS — I739 Peripheral vascular disease, unspecified: Secondary | ICD-10-CM | POA: Diagnosis not present

## 2016-08-02 DIAGNOSIS — K219 Gastro-esophageal reflux disease without esophagitis: Secondary | ICD-10-CM | POA: Diagnosis not present

## 2016-08-02 DIAGNOSIS — I110 Hypertensive heart disease with heart failure: Secondary | ICD-10-CM | POA: Diagnosis not present

## 2016-08-02 DIAGNOSIS — I509 Heart failure, unspecified: Secondary | ICD-10-CM | POA: Diagnosis not present

## 2016-08-02 DIAGNOSIS — Z87440 Personal history of urinary (tract) infections: Secondary | ICD-10-CM | POA: Diagnosis not present

## 2016-08-09 DIAGNOSIS — J449 Chronic obstructive pulmonary disease, unspecified: Secondary | ICD-10-CM | POA: Diagnosis not present

## 2016-08-09 DIAGNOSIS — I739 Peripheral vascular disease, unspecified: Secondary | ICD-10-CM | POA: Diagnosis not present

## 2016-08-09 DIAGNOSIS — Z87440 Personal history of urinary (tract) infections: Secondary | ICD-10-CM | POA: Diagnosis not present

## 2016-08-09 DIAGNOSIS — I110 Hypertensive heart disease with heart failure: Secondary | ICD-10-CM | POA: Diagnosis not present

## 2016-08-09 DIAGNOSIS — I509 Heart failure, unspecified: Secondary | ICD-10-CM | POA: Diagnosis not present

## 2016-08-09 DIAGNOSIS — K219 Gastro-esophageal reflux disease without esophagitis: Secondary | ICD-10-CM | POA: Diagnosis not present

## 2016-08-16 DIAGNOSIS — I739 Peripheral vascular disease, unspecified: Secondary | ICD-10-CM | POA: Diagnosis not present

## 2016-08-16 DIAGNOSIS — Z87440 Personal history of urinary (tract) infections: Secondary | ICD-10-CM | POA: Diagnosis not present

## 2016-08-16 DIAGNOSIS — I509 Heart failure, unspecified: Secondary | ICD-10-CM | POA: Diagnosis not present

## 2016-08-16 DIAGNOSIS — I110 Hypertensive heart disease with heart failure: Secondary | ICD-10-CM | POA: Diagnosis not present

## 2016-08-16 DIAGNOSIS — K219 Gastro-esophageal reflux disease without esophagitis: Secondary | ICD-10-CM | POA: Diagnosis not present

## 2016-08-16 DIAGNOSIS — J449 Chronic obstructive pulmonary disease, unspecified: Secondary | ICD-10-CM | POA: Diagnosis not present

## 2016-08-24 ENCOUNTER — Telehealth: Payer: Self-pay | Admitting: Family Medicine

## 2016-08-24 DIAGNOSIS — I739 Peripheral vascular disease, unspecified: Secondary | ICD-10-CM | POA: Diagnosis not present

## 2016-08-24 DIAGNOSIS — I110 Hypertensive heart disease with heart failure: Secondary | ICD-10-CM | POA: Diagnosis not present

## 2016-08-24 DIAGNOSIS — I509 Heart failure, unspecified: Secondary | ICD-10-CM | POA: Diagnosis not present

## 2016-08-24 DIAGNOSIS — J449 Chronic obstructive pulmonary disease, unspecified: Secondary | ICD-10-CM | POA: Diagnosis not present

## 2016-08-24 DIAGNOSIS — Z87440 Personal history of urinary (tract) infections: Secondary | ICD-10-CM | POA: Diagnosis not present

## 2016-08-24 DIAGNOSIS — K219 Gastro-esophageal reflux disease without esophagitis: Secondary | ICD-10-CM | POA: Diagnosis not present

## 2016-08-24 NOTE — Telephone Encounter (Signed)
Reason for call: right side pain ,back pain  Symptoms: itchy skin on back, skin is tender to touch , feels like a band around her on her right side radiating around back ,patient denies chest pain, arm numbness, neck pain Duration since last week Patient hasn't had shingles immunization  Spoke with patient and [name] from [facility]  Appointment scheduled 08/25/16

## 2016-08-24 NOTE — Telephone Encounter (Signed)
[name] from [facility] called and stated that pt is c/o pain in the center of her back and wraps around the side to the front. [name] states that she thinks that it is shingles though there are no lesions. Please advise, thank you!  Call [facility] @ [phone]

## 2016-08-25 ENCOUNTER — Ambulatory Visit (INDEPENDENT_AMBULATORY_CARE_PROVIDER_SITE_OTHER): Payer: Medicare Other | Admitting: Family Medicine

## 2016-08-25 ENCOUNTER — Encounter: Payer: Self-pay | Admitting: Family Medicine

## 2016-08-25 DIAGNOSIS — M549 Dorsalgia, unspecified: Secondary | ICD-10-CM | POA: Diagnosis not present

## 2016-08-25 NOTE — Progress Notes (Signed)
Pre visit review using our clinic review tool, if applicable. No additional management support is needed unless otherwise documented below in the visit note. 

## 2016-08-25 NOTE — Patient Instructions (Signed)
Let me know if it persists.  Take care  Dr. Lacinda Axon

## 2016-08-25 NOTE — Progress Notes (Signed)
   Subjective:  Patient ID: Cindy Graham, female    DOB: December 02, 1921  Age: 83 y.o. MRN: [id number]  CC: Back pain concerned about shingles  HPI:  83 year old female presents with the above complaint.  Patient reports a one-week history of right-sided back pain, particularly the low back. Patient states that the pain feels superficial. She feels like it's just under the skin. She reports that it started with itching and then progressed to discomfort. She does note dry skin. No rash noted. Mild in severity. No known exacerbating or relieving factors. She is concerned that she may have shingles.  Social Hx   Social History   Social History  . Marital status: Married    Spouse name: N/A  . Number of children: N/A  . Years of education: N/A   Social History Main Topics  . Smoking status: Never Smoker  . Smokeless tobacco: Never Used  . Alcohol use No  . Drug use: No  . Sexual activity: Not Currently   Other Topics Concern  . None   Social History Narrative  . None    Review of Systems  Constitutional: Negative.   Musculoskeletal: Positive for back pain.  Skin: Negative for rash.   Objective:  BP 140/68 (BP Location: Left Arm, Cuff Size: Normal)   Pulse 73   Temp 97.7 F (36.5 C) (Oral)   Wt 183 lb 4 oz (83.1 kg)   SpO2 94%   BMI 32.46 kg/m   BP/Weight 08/25/2016 05/05/2016 48/09/4625  Systolic BP 035 009 381  Diastolic BP 68 77 50  Wt. (Lbs) 183.25 181.5 180.5  BMI 32.46 32.15 31.97    Physical Exam  Constitutional: She is oriented to person, place, and time. She appears well-developed. No distress.  Musculoskeletal:  Lumbar spine - mildly tender to palpation (right sided). No rash noted. Dry skin noted.  Neurological: She is alert and oriented to person, place, and time.  Skin: No rash noted.  Dry skin noted.   Psychiatric: She has a normal mood and affect.  Vitals reviewed.   Lab Results  Component Value Date   WBC 7.4 11/26/2015   HGB 12.3 11/26/2015     HCT 37.8 11/26/2015   PLT 279.0 11/26/2015   GLUCOSE 95 11/26/2015   CHOL 214 (H) 07/16/2014   TRIG 207.0 (H) 07/16/2014   HDL 48.80 07/16/2014   LDLDIRECT 135.0 07/16/2014   LDLCALC 150 (H) 03/15/2013   ALT 12 11/26/2015   AST 23 11/26/2015   NA 137 11/26/2015   K 4.6 11/26/2015   CL 102 11/26/2015   CREATININE 1.15 11/26/2015   BUN 26 (H) 11/26/2015   CO2 28 11/26/2015   TSH 3.20 03/06/2015   HGBA1C 6.0 03/14/2013   MICROALBUR 0.7 01/15/2014    Assessment & Plan:   Problem List Items Addressed This Visit    Back pain    Patient has chronic back pain. This appears to be a new problem as it is different based on her history. She's describing superficial back pain. She is concerned about shingles. Patient states that it spans essentially the whole right side of her back. Shingles does not and this many dermatomes. No rash visible. I advised conservative care and continue use of her regular medications. Advised use of Cera Ve for dry skin.         Follow-up: PRN  [place]

## 2016-08-25 NOTE — Assessment & Plan Note (Signed)
Patient has chronic back pain. This appears to be a new problem as it is different based on her history. She's describing superficial back pain. She is concerned about shingles. Patient states that it spans essentially the whole right side of her back. Shingles does not and this many dermatomes. No rash visible. I advised conservative care and continue use of her regular medications. Advised use of Cera Ve for dry skin.

## 2016-08-30 DIAGNOSIS — I739 Peripheral vascular disease, unspecified: Secondary | ICD-10-CM | POA: Diagnosis not present

## 2016-08-30 DIAGNOSIS — K219 Gastro-esophageal reflux disease without esophagitis: Secondary | ICD-10-CM | POA: Diagnosis not present

## 2016-08-30 DIAGNOSIS — J449 Chronic obstructive pulmonary disease, unspecified: Secondary | ICD-10-CM | POA: Diagnosis not present

## 2016-08-30 DIAGNOSIS — I509 Heart failure, unspecified: Secondary | ICD-10-CM | POA: Diagnosis not present

## 2016-08-30 DIAGNOSIS — I110 Hypertensive heart disease with heart failure: Secondary | ICD-10-CM | POA: Diagnosis not present

## 2016-08-30 DIAGNOSIS — Z87440 Personal history of urinary (tract) infections: Secondary | ICD-10-CM | POA: Diagnosis not present

## 2016-09-02 DIAGNOSIS — M1711 Unilateral primary osteoarthritis, right knee: Secondary | ICD-10-CM | POA: Diagnosis not present

## 2016-09-02 DIAGNOSIS — M5126 Other intervertebral disc displacement, lumbar region: Secondary | ICD-10-CM | POA: Diagnosis not present

## 2016-09-02 DIAGNOSIS — M1612 Unilateral primary osteoarthritis, left hip: Secondary | ICD-10-CM | POA: Diagnosis not present

## 2016-09-02 DIAGNOSIS — M5416 Radiculopathy, lumbar region: Secondary | ICD-10-CM | POA: Diagnosis not present

## 2016-09-05 ENCOUNTER — Telehealth: Payer: Self-pay | Admitting: Family Medicine

## 2016-09-05 DIAGNOSIS — Z87440 Personal history of urinary (tract) infections: Secondary | ICD-10-CM | POA: Diagnosis not present

## 2016-09-05 DIAGNOSIS — K219 Gastro-esophageal reflux disease without esophagitis: Secondary | ICD-10-CM | POA: Diagnosis not present

## 2016-09-05 DIAGNOSIS — J449 Chronic obstructive pulmonary disease, unspecified: Secondary | ICD-10-CM | POA: Diagnosis not present

## 2016-09-05 DIAGNOSIS — I110 Hypertensive heart disease with heart failure: Secondary | ICD-10-CM | POA: Diagnosis not present

## 2016-09-05 DIAGNOSIS — I739 Peripheral vascular disease, unspecified: Secondary | ICD-10-CM | POA: Diagnosis not present

## 2016-09-05 DIAGNOSIS — I509 Heart failure, unspecified: Secondary | ICD-10-CM | POA: Diagnosis not present

## 2016-09-05 NOTE — Telephone Encounter (Signed)
Cindy Graham was called and given verbal okay for increased visits to check on pts wound.

## 2016-09-05 NOTE — Telephone Encounter (Signed)
Okay for verbal 

## 2016-09-05 NOTE — Telephone Encounter (Signed)
Called stating that pt hit her leg on toilet seat lock. She now has a skin tear. [name] put Vaseline gauze and foam. The wound is not infected but linda wants a verbal to increase home visit to check on it due to pt living alone.

## 2016-09-07 DIAGNOSIS — Z87440 Personal history of urinary (tract) infections: Secondary | ICD-10-CM | POA: Diagnosis not present

## 2016-09-07 DIAGNOSIS — K219 Gastro-esophageal reflux disease without esophagitis: Secondary | ICD-10-CM | POA: Diagnosis not present

## 2016-09-07 DIAGNOSIS — I509 Heart failure, unspecified: Secondary | ICD-10-CM | POA: Diagnosis not present

## 2016-09-07 DIAGNOSIS — I739 Peripheral vascular disease, unspecified: Secondary | ICD-10-CM | POA: Diagnosis not present

## 2016-09-07 DIAGNOSIS — J449 Chronic obstructive pulmonary disease, unspecified: Secondary | ICD-10-CM | POA: Diagnosis not present

## 2016-09-07 DIAGNOSIS — I110 Hypertensive heart disease with heart failure: Secondary | ICD-10-CM | POA: Diagnosis not present

## 2016-09-09 DIAGNOSIS — I509 Heart failure, unspecified: Secondary | ICD-10-CM | POA: Diagnosis not present

## 2016-09-09 DIAGNOSIS — J449 Chronic obstructive pulmonary disease, unspecified: Secondary | ICD-10-CM | POA: Diagnosis not present

## 2016-09-09 DIAGNOSIS — K219 Gastro-esophageal reflux disease without esophagitis: Secondary | ICD-10-CM | POA: Diagnosis not present

## 2016-09-09 DIAGNOSIS — I110 Hypertensive heart disease with heart failure: Secondary | ICD-10-CM | POA: Diagnosis not present

## 2016-09-09 DIAGNOSIS — I739 Peripheral vascular disease, unspecified: Secondary | ICD-10-CM | POA: Diagnosis not present

## 2016-09-09 DIAGNOSIS — Z87440 Personal history of urinary (tract) infections: Secondary | ICD-10-CM | POA: Diagnosis not present

## 2016-09-12 DIAGNOSIS — J449 Chronic obstructive pulmonary disease, unspecified: Secondary | ICD-10-CM | POA: Diagnosis not present

## 2016-09-12 DIAGNOSIS — Z87440 Personal history of urinary (tract) infections: Secondary | ICD-10-CM | POA: Diagnosis not present

## 2016-09-12 DIAGNOSIS — I739 Peripheral vascular disease, unspecified: Secondary | ICD-10-CM | POA: Diagnosis not present

## 2016-09-12 DIAGNOSIS — I110 Hypertensive heart disease with heart failure: Secondary | ICD-10-CM | POA: Diagnosis not present

## 2016-09-12 DIAGNOSIS — K219 Gastro-esophageal reflux disease without esophagitis: Secondary | ICD-10-CM | POA: Diagnosis not present

## 2016-09-12 DIAGNOSIS — I509 Heart failure, unspecified: Secondary | ICD-10-CM | POA: Diagnosis not present

## 2016-09-14 ENCOUNTER — Telehealth: Payer: Self-pay | Admitting: Family Medicine

## 2016-09-14 DIAGNOSIS — Z87440 Personal history of urinary (tract) infections: Secondary | ICD-10-CM | POA: Diagnosis not present

## 2016-09-14 DIAGNOSIS — I110 Hypertensive heart disease with heart failure: Secondary | ICD-10-CM | POA: Diagnosis not present

## 2016-09-14 DIAGNOSIS — J449 Chronic obstructive pulmonary disease, unspecified: Secondary | ICD-10-CM | POA: Diagnosis not present

## 2016-09-14 DIAGNOSIS — K219 Gastro-esophageal reflux disease without esophagitis: Secondary | ICD-10-CM | POA: Diagnosis not present

## 2016-09-14 DIAGNOSIS — I739 Peripheral vascular disease, unspecified: Secondary | ICD-10-CM | POA: Diagnosis not present

## 2016-09-14 DIAGNOSIS — I509 Heart failure, unspecified: Secondary | ICD-10-CM | POA: Diagnosis not present

## 2016-09-14 NOTE — Telephone Encounter (Signed)
Linda [phone] called from [place] [facility] regarding pt bp is elevated today 180/80 in both arms. Not symptomatic. Not asking for a change at this time would it be ok for pt to reassessed tomorrow. VM can be left. Thank you!

## 2016-09-15 DIAGNOSIS — Z87440 Personal history of urinary (tract) infections: Secondary | ICD-10-CM | POA: Diagnosis not present

## 2016-09-15 DIAGNOSIS — I110 Hypertensive heart disease with heart failure: Secondary | ICD-10-CM | POA: Diagnosis not present

## 2016-09-15 DIAGNOSIS — I509 Heart failure, unspecified: Secondary | ICD-10-CM | POA: Diagnosis not present

## 2016-09-15 DIAGNOSIS — I739 Peripheral vascular disease, unspecified: Secondary | ICD-10-CM | POA: Diagnosis not present

## 2016-09-15 DIAGNOSIS — K219 Gastro-esophageal reflux disease without esophagitis: Secondary | ICD-10-CM | POA: Diagnosis not present

## 2016-09-15 DIAGNOSIS — J449 Chronic obstructive pulmonary disease, unspecified: Secondary | ICD-10-CM | POA: Diagnosis not present

## 2016-09-15 NOTE — Telephone Encounter (Signed)
V/O okay to give? 

## 2016-09-15 NOTE — Telephone Encounter (Signed)
[name] called and given verbal order.

## 2016-09-15 NOTE — Telephone Encounter (Signed)
Yes

## 2016-09-16 DIAGNOSIS — K219 Gastro-esophageal reflux disease without esophagitis: Secondary | ICD-10-CM | POA: Diagnosis not present

## 2016-09-16 DIAGNOSIS — Z87440 Personal history of urinary (tract) infections: Secondary | ICD-10-CM | POA: Diagnosis not present

## 2016-09-16 DIAGNOSIS — I110 Hypertensive heart disease with heart failure: Secondary | ICD-10-CM | POA: Diagnosis not present

## 2016-09-16 DIAGNOSIS — I509 Heart failure, unspecified: Secondary | ICD-10-CM | POA: Diagnosis not present

## 2016-09-16 DIAGNOSIS — I739 Peripheral vascular disease, unspecified: Secondary | ICD-10-CM | POA: Diagnosis not present

## 2016-09-16 DIAGNOSIS — J449 Chronic obstructive pulmonary disease, unspecified: Secondary | ICD-10-CM | POA: Diagnosis not present

## 2016-09-19 DIAGNOSIS — I509 Heart failure, unspecified: Secondary | ICD-10-CM | POA: Diagnosis not present

## 2016-09-19 DIAGNOSIS — I739 Peripheral vascular disease, unspecified: Secondary | ICD-10-CM | POA: Diagnosis not present

## 2016-09-19 DIAGNOSIS — I110 Hypertensive heart disease with heart failure: Secondary | ICD-10-CM | POA: Diagnosis not present

## 2016-09-19 DIAGNOSIS — J449 Chronic obstructive pulmonary disease, unspecified: Secondary | ICD-10-CM | POA: Diagnosis not present

## 2016-09-19 DIAGNOSIS — K219 Gastro-esophageal reflux disease without esophagitis: Secondary | ICD-10-CM | POA: Diagnosis not present

## 2016-09-19 DIAGNOSIS — Z87440 Personal history of urinary (tract) infections: Secondary | ICD-10-CM | POA: Diagnosis not present

## 2016-09-21 DIAGNOSIS — J449 Chronic obstructive pulmonary disease, unspecified: Secondary | ICD-10-CM | POA: Diagnosis not present

## 2016-09-21 DIAGNOSIS — R32 Unspecified urinary incontinence: Secondary | ICD-10-CM | POA: Diagnosis not present

## 2016-09-21 DIAGNOSIS — I110 Hypertensive heart disease with heart failure: Secondary | ICD-10-CM | POA: Diagnosis not present

## 2016-09-21 DIAGNOSIS — Z87440 Personal history of urinary (tract) infections: Secondary | ICD-10-CM | POA: Diagnosis not present

## 2016-09-21 DIAGNOSIS — K219 Gastro-esophageal reflux disease without esophagitis: Secondary | ICD-10-CM | POA: Diagnosis not present

## 2016-09-21 DIAGNOSIS — I509 Heart failure, unspecified: Secondary | ICD-10-CM | POA: Diagnosis not present

## 2016-09-21 DIAGNOSIS — S81802D Unspecified open wound, left lower leg, subsequent encounter: Secondary | ICD-10-CM | POA: Diagnosis not present

## 2016-09-21 DIAGNOSIS — Z9981 Dependence on supplemental oxygen: Secondary | ICD-10-CM | POA: Diagnosis not present

## 2016-09-21 DIAGNOSIS — I739 Peripheral vascular disease, unspecified: Secondary | ICD-10-CM | POA: Diagnosis not present

## 2016-09-21 DIAGNOSIS — Z742 Need for assistance at home and no other household member able to render care: Secondary | ICD-10-CM | POA: Diagnosis not present

## 2016-09-21 DIAGNOSIS — Z8501 Personal history of malignant neoplasm of esophagus: Secondary | ICD-10-CM | POA: Diagnosis not present

## 2016-09-21 DIAGNOSIS — Z7982 Long term (current) use of aspirin: Secondary | ICD-10-CM | POA: Diagnosis not present

## 2016-09-21 DIAGNOSIS — Z8701 Personal history of pneumonia (recurrent): Secondary | ICD-10-CM | POA: Diagnosis not present

## 2016-09-23 DIAGNOSIS — S81802D Unspecified open wound, left lower leg, subsequent encounter: Secondary | ICD-10-CM | POA: Diagnosis not present

## 2016-09-23 DIAGNOSIS — K219 Gastro-esophageal reflux disease without esophagitis: Secondary | ICD-10-CM | POA: Diagnosis not present

## 2016-09-23 DIAGNOSIS — I110 Hypertensive heart disease with heart failure: Secondary | ICD-10-CM | POA: Diagnosis not present

## 2016-09-23 DIAGNOSIS — J449 Chronic obstructive pulmonary disease, unspecified: Secondary | ICD-10-CM | POA: Diagnosis not present

## 2016-09-23 DIAGNOSIS — I509 Heart failure, unspecified: Secondary | ICD-10-CM | POA: Diagnosis not present

## 2016-09-23 DIAGNOSIS — I739 Peripheral vascular disease, unspecified: Secondary | ICD-10-CM | POA: Diagnosis not present

## 2016-09-27 ENCOUNTER — Telehealth: Payer: Self-pay | Admitting: Family Medicine

## 2016-09-27 DIAGNOSIS — J449 Chronic obstructive pulmonary disease, unspecified: Secondary | ICD-10-CM | POA: Diagnosis not present

## 2016-09-27 DIAGNOSIS — I110 Hypertensive heart disease with heart failure: Secondary | ICD-10-CM | POA: Diagnosis not present

## 2016-09-27 DIAGNOSIS — I509 Heart failure, unspecified: Secondary | ICD-10-CM | POA: Diagnosis not present

## 2016-09-27 DIAGNOSIS — I739 Peripheral vascular disease, unspecified: Secondary | ICD-10-CM | POA: Diagnosis not present

## 2016-09-27 DIAGNOSIS — S81802D Unspecified open wound, left lower leg, subsequent encounter: Secondary | ICD-10-CM | POA: Diagnosis not present

## 2016-09-27 DIAGNOSIS — K219 Gastro-esophageal reflux disease without esophagitis: Secondary | ICD-10-CM | POA: Diagnosis not present

## 2016-09-27 NOTE — Telephone Encounter (Signed)
Cindy Graham was called and given verbal orders to reassess.

## 2016-09-27 NOTE — Telephone Encounter (Signed)
Linda [phone] called from [facility] regarding pt bp is up again today 180/82 in left arm and 182/80 in right arm. No symptoms is it ok for pt to be reassessed tomorrow and notify if not down or other symptoms? Verbal order is ok and msg can be left. Thank you!

## 2016-09-27 NOTE — Telephone Encounter (Signed)
V/O okay to give? 

## 2016-09-27 NOTE — Telephone Encounter (Signed)
Yes verbal is ok.

## 2016-09-28 DIAGNOSIS — J449 Chronic obstructive pulmonary disease, unspecified: Secondary | ICD-10-CM | POA: Diagnosis not present

## 2016-09-28 DIAGNOSIS — K219 Gastro-esophageal reflux disease without esophagitis: Secondary | ICD-10-CM | POA: Diagnosis not present

## 2016-09-28 DIAGNOSIS — I509 Heart failure, unspecified: Secondary | ICD-10-CM | POA: Diagnosis not present

## 2016-09-28 DIAGNOSIS — S81802D Unspecified open wound, left lower leg, subsequent encounter: Secondary | ICD-10-CM | POA: Diagnosis not present

## 2016-09-28 DIAGNOSIS — I739 Peripheral vascular disease, unspecified: Secondary | ICD-10-CM | POA: Diagnosis not present

## 2016-09-28 DIAGNOSIS — I110 Hypertensive heart disease with heart failure: Secondary | ICD-10-CM | POA: Diagnosis not present

## 2016-10-04 DIAGNOSIS — J449 Chronic obstructive pulmonary disease, unspecified: Secondary | ICD-10-CM | POA: Diagnosis not present

## 2016-10-04 DIAGNOSIS — K219 Gastro-esophageal reflux disease without esophagitis: Secondary | ICD-10-CM | POA: Diagnosis not present

## 2016-10-04 DIAGNOSIS — I509 Heart failure, unspecified: Secondary | ICD-10-CM | POA: Diagnosis not present

## 2016-10-04 DIAGNOSIS — I739 Peripheral vascular disease, unspecified: Secondary | ICD-10-CM | POA: Diagnosis not present

## 2016-10-04 DIAGNOSIS — I110 Hypertensive heart disease with heart failure: Secondary | ICD-10-CM | POA: Diagnosis not present

## 2016-10-04 DIAGNOSIS — S81802D Unspecified open wound, left lower leg, subsequent encounter: Secondary | ICD-10-CM | POA: Diagnosis not present

## 2016-10-11 DIAGNOSIS — J449 Chronic obstructive pulmonary disease, unspecified: Secondary | ICD-10-CM | POA: Diagnosis not present

## 2016-10-11 DIAGNOSIS — K219 Gastro-esophageal reflux disease without esophagitis: Secondary | ICD-10-CM | POA: Diagnosis not present

## 2016-10-11 DIAGNOSIS — I739 Peripheral vascular disease, unspecified: Secondary | ICD-10-CM | POA: Diagnosis not present

## 2016-10-11 DIAGNOSIS — I509 Heart failure, unspecified: Secondary | ICD-10-CM | POA: Diagnosis not present

## 2016-10-11 DIAGNOSIS — I110 Hypertensive heart disease with heart failure: Secondary | ICD-10-CM | POA: Diagnosis not present

## 2016-10-11 DIAGNOSIS — S81802D Unspecified open wound, left lower leg, subsequent encounter: Secondary | ICD-10-CM | POA: Diagnosis not present

## 2016-10-14 ENCOUNTER — Other Ambulatory Visit: Payer: Self-pay | Admitting: Family Medicine

## 2016-10-17 DIAGNOSIS — I739 Peripheral vascular disease, unspecified: Secondary | ICD-10-CM | POA: Diagnosis not present

## 2016-10-17 DIAGNOSIS — I509 Heart failure, unspecified: Secondary | ICD-10-CM | POA: Diagnosis not present

## 2016-10-17 DIAGNOSIS — J449 Chronic obstructive pulmonary disease, unspecified: Secondary | ICD-10-CM | POA: Diagnosis not present

## 2016-10-17 DIAGNOSIS — K219 Gastro-esophageal reflux disease without esophagitis: Secondary | ICD-10-CM | POA: Diagnosis not present

## 2016-10-17 DIAGNOSIS — I110 Hypertensive heart disease with heart failure: Secondary | ICD-10-CM | POA: Diagnosis not present

## 2016-10-17 DIAGNOSIS — S81802D Unspecified open wound, left lower leg, subsequent encounter: Secondary | ICD-10-CM | POA: Diagnosis not present

## 2016-10-18 ENCOUNTER — Telehealth: Payer: Self-pay | Admitting: Family Medicine

## 2016-10-18 NOTE — Telephone Encounter (Signed)
Okay with PT? Thanks

## 2016-10-18 NOTE — Telephone Encounter (Signed)
Linda from [place] [facility] called and stated that pt had fallen over the weekend. Pt stated that she lost her balance and fell backwards. Pt stated that this has becoming a little more frequent. [name] would like orders for PT for balance testing and safety. Please advise, thank you!  Call [facility] @ [phone]

## 2016-10-18 NOTE — Telephone Encounter (Signed)
Called [name] and gave verbal order, thanks

## 2016-10-18 NOTE — Telephone Encounter (Signed)
Yes

## 2016-10-25 DIAGNOSIS — I509 Heart failure, unspecified: Secondary | ICD-10-CM | POA: Diagnosis not present

## 2016-10-25 DIAGNOSIS — S81802D Unspecified open wound, left lower leg, subsequent encounter: Secondary | ICD-10-CM | POA: Diagnosis not present

## 2016-10-25 DIAGNOSIS — K219 Gastro-esophageal reflux disease without esophagitis: Secondary | ICD-10-CM | POA: Diagnosis not present

## 2016-10-25 DIAGNOSIS — I110 Hypertensive heart disease with heart failure: Secondary | ICD-10-CM | POA: Diagnosis not present

## 2016-10-25 DIAGNOSIS — J449 Chronic obstructive pulmonary disease, unspecified: Secondary | ICD-10-CM | POA: Diagnosis not present

## 2016-10-25 DIAGNOSIS — I739 Peripheral vascular disease, unspecified: Secondary | ICD-10-CM | POA: Diagnosis not present

## 2016-10-27 DIAGNOSIS — I739 Peripheral vascular disease, unspecified: Secondary | ICD-10-CM | POA: Diagnosis not present

## 2016-10-27 DIAGNOSIS — I110 Hypertensive heart disease with heart failure: Secondary | ICD-10-CM | POA: Diagnosis not present

## 2016-10-27 DIAGNOSIS — K219 Gastro-esophageal reflux disease without esophagitis: Secondary | ICD-10-CM | POA: Diagnosis not present

## 2016-10-27 DIAGNOSIS — S81802D Unspecified open wound, left lower leg, subsequent encounter: Secondary | ICD-10-CM | POA: Diagnosis not present

## 2016-10-27 DIAGNOSIS — J449 Chronic obstructive pulmonary disease, unspecified: Secondary | ICD-10-CM | POA: Diagnosis not present

## 2016-10-27 DIAGNOSIS — I509 Heart failure, unspecified: Secondary | ICD-10-CM | POA: Diagnosis not present

## 2016-10-29 DIAGNOSIS — S81802D Unspecified open wound, left lower leg, subsequent encounter: Secondary | ICD-10-CM | POA: Diagnosis not present

## 2016-10-29 DIAGNOSIS — K219 Gastro-esophageal reflux disease without esophagitis: Secondary | ICD-10-CM | POA: Diagnosis not present

## 2016-10-29 DIAGNOSIS — J449 Chronic obstructive pulmonary disease, unspecified: Secondary | ICD-10-CM | POA: Diagnosis not present

## 2016-10-29 DIAGNOSIS — I739 Peripheral vascular disease, unspecified: Secondary | ICD-10-CM | POA: Diagnosis not present

## 2016-10-29 DIAGNOSIS — I509 Heart failure, unspecified: Secondary | ICD-10-CM | POA: Diagnosis not present

## 2016-10-29 DIAGNOSIS — I110 Hypertensive heart disease with heart failure: Secondary | ICD-10-CM | POA: Diagnosis not present

## 2016-11-01 DIAGNOSIS — J449 Chronic obstructive pulmonary disease, unspecified: Secondary | ICD-10-CM | POA: Diagnosis not present

## 2016-11-01 DIAGNOSIS — S81802D Unspecified open wound, left lower leg, subsequent encounter: Secondary | ICD-10-CM | POA: Diagnosis not present

## 2016-11-01 DIAGNOSIS — I739 Peripheral vascular disease, unspecified: Secondary | ICD-10-CM | POA: Diagnosis not present

## 2016-11-01 DIAGNOSIS — I110 Hypertensive heart disease with heart failure: Secondary | ICD-10-CM | POA: Diagnosis not present

## 2016-11-01 DIAGNOSIS — K219 Gastro-esophageal reflux disease without esophagitis: Secondary | ICD-10-CM | POA: Diagnosis not present

## 2016-11-01 DIAGNOSIS — I509 Heart failure, unspecified: Secondary | ICD-10-CM | POA: Diagnosis not present

## 2016-11-02 DIAGNOSIS — J449 Chronic obstructive pulmonary disease, unspecified: Secondary | ICD-10-CM | POA: Diagnosis not present

## 2016-11-02 DIAGNOSIS — S81802D Unspecified open wound, left lower leg, subsequent encounter: Secondary | ICD-10-CM | POA: Diagnosis not present

## 2016-11-02 DIAGNOSIS — I509 Heart failure, unspecified: Secondary | ICD-10-CM | POA: Diagnosis not present

## 2016-11-02 DIAGNOSIS — I739 Peripheral vascular disease, unspecified: Secondary | ICD-10-CM | POA: Diagnosis not present

## 2016-11-02 DIAGNOSIS — K219 Gastro-esophageal reflux disease without esophagitis: Secondary | ICD-10-CM | POA: Diagnosis not present

## 2016-11-02 DIAGNOSIS — I110 Hypertensive heart disease with heart failure: Secondary | ICD-10-CM | POA: Diagnosis not present

## 2016-11-03 ENCOUNTER — Encounter: Payer: Self-pay | Admitting: Family Medicine

## 2016-11-03 ENCOUNTER — Ambulatory Visit (INDEPENDENT_AMBULATORY_CARE_PROVIDER_SITE_OTHER): Payer: Medicare Other | Admitting: Family Medicine

## 2016-11-03 DIAGNOSIS — M549 Dorsalgia, unspecified: Secondary | ICD-10-CM

## 2016-11-03 DIAGNOSIS — R05 Cough: Secondary | ICD-10-CM | POA: Diagnosis not present

## 2016-11-03 DIAGNOSIS — F329 Major depressive disorder, single episode, unspecified: Secondary | ICD-10-CM

## 2016-11-03 DIAGNOSIS — G8929 Other chronic pain: Secondary | ICD-10-CM | POA: Diagnosis not present

## 2016-11-03 DIAGNOSIS — I10 Essential (primary) hypertension: Secondary | ICD-10-CM | POA: Diagnosis not present

## 2016-11-03 DIAGNOSIS — F32A Depression, unspecified: Secondary | ICD-10-CM

## 2016-11-03 DIAGNOSIS — R053 Chronic cough: Secondary | ICD-10-CM

## 2016-11-03 NOTE — Assessment & Plan Note (Signed)
Stable. Continue current meds.   

## 2016-11-03 NOTE — Progress Notes (Signed)
   Subjective:  Patient ID: Cindy Graham, female    DOB: 1922/02/16  Age: 83 y.o. MRN: [id number]  CC: Follow up  HPI:  83 year old female with PVD, HTN, CKD, HLD, Chronic back pain presents for follow up.  HTN  Stable.  Compliant with Dyazide, Verapamil, Enalapril.  Depression and anxiety   Stable on Zoloft.   Low back pain  Sees [facility].  Stable currently on Gabapentin, Nucynta.  Chronic cough  Stable currently on Dulera.  No complaints of cough today.  Social Hx   Social History   Social History  . Marital status: Married    Spouse name: N/A  . Number of children: N/A  . Years of education: N/A   Social History Main Topics  . Smoking status: Never Smoker  . Smokeless tobacco: Never Used  . Alcohol use No  . Drug use: No  . Sexual activity: Not Currently   Other Topics Concern  . None   Social History Narrative  . None    Review of Systems  Constitutional: Positive for fatigue.  Respiratory: Negative.    Objective:  BP (!) 146/58   Pulse 68   Temp 98.3 F (36.8 C) (Oral)   Resp 16   Ht 5\' 3"  (1.6 m)   Wt 180 lb 12 oz (82 kg)   SpO2 95%   BMI 32.02 kg/m   BP/Weight 11/03/2016 08/25/2016 71/69/6789  Systolic BP 381 017 510  Diastolic BP 58 68 77  Wt. (Lbs) 180.75 183.25 181.5  BMI 32.02 32.46 32.15    Physical Exam  Constitutional: She is oriented to person, place, and time. She appears well-developed. No distress.  Cardiovascular: Normal rate and regular rhythm.   Pulmonary/Chest: Effort normal and breath sounds normal.  Neurological: She is alert and oriented to person, place, and time.  Psychiatric: She has a normal mood and affect.  Vitals reviewed.   Lab Results  Component Value Date   WBC 7.4 11/26/2015   HGB 12.3 11/26/2015   HCT 37.8 11/26/2015   PLT 279.0 11/26/2015   GLUCOSE 95 11/26/2015   CHOL 214 (H) 07/16/2014   TRIG 207.0 (H) 07/16/2014   HDL 48.80 07/16/2014   LDLDIRECT 135.0 07/16/2014   LDLCALC 150 (H)  03/15/2013   ALT 12 11/26/2015   AST 23 11/26/2015   NA 137 11/26/2015   K 4.6 11/26/2015   CL 102 11/26/2015   CREATININE 1.15 11/26/2015   BUN 26 (H) 11/26/2015   CO2 28 11/26/2015   TSH 3.20 03/06/2015   HGBA1C 6.0 03/14/2013   MICROALBUR 0.7 01/15/2014    Assessment & Plan:   Problem List Items Addressed This Visit      Cardiovascular and Mediastinum   Hypertension (Chronic)    Stable. Continue current meds.        Other   Depression/Anxiety    Stable on Zoloft. Continue.       Chronic cough    Stable. No complaints regarding this today. Continue dulera.      Chronic back pain greater than 3 months duration (Chronic)    Stable currently.         Follow-up: 6 months  [place] DO [facility]

## 2016-11-03 NOTE — Assessment & Plan Note (Signed)
Stable on Zoloft. Continue.

## 2016-11-03 NOTE — Patient Instructions (Signed)
Continue your meds.  Follow up in 6 months.  Take care  Dr. Dora Clauss  

## 2016-11-03 NOTE — Assessment & Plan Note (Signed)
Stable currently

## 2016-11-03 NOTE — Assessment & Plan Note (Signed)
Stable. No complaints regarding this today. Continue dulera.

## 2016-11-04 DIAGNOSIS — K219 Gastro-esophageal reflux disease without esophagitis: Secondary | ICD-10-CM | POA: Diagnosis not present

## 2016-11-04 DIAGNOSIS — I739 Peripheral vascular disease, unspecified: Secondary | ICD-10-CM | POA: Diagnosis not present

## 2016-11-04 DIAGNOSIS — I110 Hypertensive heart disease with heart failure: Secondary | ICD-10-CM | POA: Diagnosis not present

## 2016-11-04 DIAGNOSIS — J449 Chronic obstructive pulmonary disease, unspecified: Secondary | ICD-10-CM | POA: Diagnosis not present

## 2016-11-04 DIAGNOSIS — S81802D Unspecified open wound, left lower leg, subsequent encounter: Secondary | ICD-10-CM | POA: Diagnosis not present

## 2016-11-04 DIAGNOSIS — I509 Heart failure, unspecified: Secondary | ICD-10-CM | POA: Diagnosis not present

## 2016-11-08 DIAGNOSIS — I509 Heart failure, unspecified: Secondary | ICD-10-CM | POA: Diagnosis not present

## 2016-11-08 DIAGNOSIS — I110 Hypertensive heart disease with heart failure: Secondary | ICD-10-CM | POA: Diagnosis not present

## 2016-11-08 DIAGNOSIS — I739 Peripheral vascular disease, unspecified: Secondary | ICD-10-CM | POA: Diagnosis not present

## 2016-11-08 DIAGNOSIS — J449 Chronic obstructive pulmonary disease, unspecified: Secondary | ICD-10-CM | POA: Diagnosis not present

## 2016-11-08 DIAGNOSIS — S81802D Unspecified open wound, left lower leg, subsequent encounter: Secondary | ICD-10-CM | POA: Diagnosis not present

## 2016-11-08 DIAGNOSIS — K219 Gastro-esophageal reflux disease without esophagitis: Secondary | ICD-10-CM | POA: Diagnosis not present

## 2016-11-09 DIAGNOSIS — S81802D Unspecified open wound, left lower leg, subsequent encounter: Secondary | ICD-10-CM | POA: Diagnosis not present

## 2016-11-09 DIAGNOSIS — I509 Heart failure, unspecified: Secondary | ICD-10-CM | POA: Diagnosis not present

## 2016-11-09 DIAGNOSIS — I110 Hypertensive heart disease with heart failure: Secondary | ICD-10-CM | POA: Diagnosis not present

## 2016-11-09 DIAGNOSIS — K219 Gastro-esophageal reflux disease without esophagitis: Secondary | ICD-10-CM | POA: Diagnosis not present

## 2016-11-09 DIAGNOSIS — J449 Chronic obstructive pulmonary disease, unspecified: Secondary | ICD-10-CM | POA: Diagnosis not present

## 2016-11-09 DIAGNOSIS — I739 Peripheral vascular disease, unspecified: Secondary | ICD-10-CM | POA: Diagnosis not present

## 2016-11-11 DIAGNOSIS — I739 Peripheral vascular disease, unspecified: Secondary | ICD-10-CM | POA: Diagnosis not present

## 2016-11-11 DIAGNOSIS — S81802D Unspecified open wound, left lower leg, subsequent encounter: Secondary | ICD-10-CM | POA: Diagnosis not present

## 2016-11-11 DIAGNOSIS — K219 Gastro-esophageal reflux disease without esophagitis: Secondary | ICD-10-CM | POA: Diagnosis not present

## 2016-11-11 DIAGNOSIS — I110 Hypertensive heart disease with heart failure: Secondary | ICD-10-CM | POA: Diagnosis not present

## 2016-11-11 DIAGNOSIS — J449 Chronic obstructive pulmonary disease, unspecified: Secondary | ICD-10-CM | POA: Diagnosis not present

## 2016-11-11 DIAGNOSIS — I509 Heart failure, unspecified: Secondary | ICD-10-CM | POA: Diagnosis not present

## 2016-11-16 DIAGNOSIS — K219 Gastro-esophageal reflux disease without esophagitis: Secondary | ICD-10-CM | POA: Diagnosis not present

## 2016-11-16 DIAGNOSIS — I110 Hypertensive heart disease with heart failure: Secondary | ICD-10-CM | POA: Diagnosis not present

## 2016-11-16 DIAGNOSIS — I739 Peripheral vascular disease, unspecified: Secondary | ICD-10-CM | POA: Diagnosis not present

## 2016-11-16 DIAGNOSIS — S81802D Unspecified open wound, left lower leg, subsequent encounter: Secondary | ICD-10-CM | POA: Diagnosis not present

## 2016-11-16 DIAGNOSIS — I509 Heart failure, unspecified: Secondary | ICD-10-CM | POA: Diagnosis not present

## 2016-11-16 DIAGNOSIS — J449 Chronic obstructive pulmonary disease, unspecified: Secondary | ICD-10-CM | POA: Diagnosis not present

## 2016-11-18 ENCOUNTER — Telehealth: Payer: Self-pay | Admitting: *Deleted

## 2016-11-18 DIAGNOSIS — I110 Hypertensive heart disease with heart failure: Secondary | ICD-10-CM | POA: Diagnosis not present

## 2016-11-18 DIAGNOSIS — S81802D Unspecified open wound, left lower leg, subsequent encounter: Secondary | ICD-10-CM | POA: Diagnosis not present

## 2016-11-18 DIAGNOSIS — I509 Heart failure, unspecified: Secondary | ICD-10-CM | POA: Diagnosis not present

## 2016-11-18 DIAGNOSIS — I739 Peripheral vascular disease, unspecified: Secondary | ICD-10-CM | POA: Diagnosis not present

## 2016-11-18 DIAGNOSIS — J449 Chronic obstructive pulmonary disease, unspecified: Secondary | ICD-10-CM | POA: Diagnosis not present

## 2016-11-18 DIAGNOSIS — K219 Gastro-esophageal reflux disease without esophagitis: Secondary | ICD-10-CM | POA: Diagnosis not present

## 2016-11-18 NOTE — Telephone Encounter (Signed)
Linda [facility] from [facility] reported pt having faint realm in left lower lobe, normal heard in right lower lobe. Pt reported having increase fatigue, denied cough. Weight is stable, no increase in edema . [name] requested to know if it would be okay to just monitor patient   Contact linda click [phone]

## 2016-11-18 NOTE — Telephone Encounter (Signed)
Linda has been informed

## 2016-11-18 NOTE — Telephone Encounter (Signed)
Okay to monitor 

## 2016-11-20 DIAGNOSIS — J449 Chronic obstructive pulmonary disease, unspecified: Secondary | ICD-10-CM | POA: Diagnosis not present

## 2016-11-20 DIAGNOSIS — I739 Peripheral vascular disease, unspecified: Secondary | ICD-10-CM | POA: Diagnosis not present

## 2016-11-20 DIAGNOSIS — K219 Gastro-esophageal reflux disease without esophagitis: Secondary | ICD-10-CM | POA: Diagnosis not present

## 2016-11-20 DIAGNOSIS — Z87440 Personal history of urinary (tract) infections: Secondary | ICD-10-CM | POA: Diagnosis not present

## 2016-11-20 DIAGNOSIS — Z9981 Dependence on supplemental oxygen: Secondary | ICD-10-CM | POA: Diagnosis not present

## 2016-11-20 DIAGNOSIS — Z8701 Personal history of pneumonia (recurrent): Secondary | ICD-10-CM | POA: Diagnosis not present

## 2016-11-20 DIAGNOSIS — Z742 Need for assistance at home and no other household member able to render care: Secondary | ICD-10-CM | POA: Diagnosis not present

## 2016-11-20 DIAGNOSIS — I110 Hypertensive heart disease with heart failure: Secondary | ICD-10-CM | POA: Diagnosis not present

## 2016-11-20 DIAGNOSIS — Z7982 Long term (current) use of aspirin: Secondary | ICD-10-CM | POA: Diagnosis not present

## 2016-11-20 DIAGNOSIS — R32 Unspecified urinary incontinence: Secondary | ICD-10-CM | POA: Diagnosis not present

## 2016-11-20 DIAGNOSIS — Z8501 Personal history of malignant neoplasm of esophagus: Secondary | ICD-10-CM | POA: Diagnosis not present

## 2016-11-20 DIAGNOSIS — I509 Heart failure, unspecified: Secondary | ICD-10-CM | POA: Diagnosis not present

## 2016-11-21 ENCOUNTER — Other Ambulatory Visit: Payer: Self-pay | Admitting: Radiology

## 2016-11-21 ENCOUNTER — Other Ambulatory Visit: Payer: Self-pay

## 2016-11-21 MED ORDER — SERTRALINE HCL 100 MG PO TABS: ORAL_TABLET | ORAL | 3 refills | Status: DC

## 2016-11-21 NOTE — Telephone Encounter (Signed)
Last office visit  11/03/16  Next office visit 03/10/17 Previously rx'd by Dr Gilford Rile

## 2016-11-22 ENCOUNTER — Other Ambulatory Visit: Payer: Self-pay

## 2016-11-22 MED ORDER — OMEPRAZOLE 20 MG PO CPDR: DELAYED_RELEASE_CAPSULE | ORAL | 3 refills | Status: DC

## 2016-11-22 MED ORDER — GABAPENTIN 300 MG PO CAPS: ORAL_CAPSULE | ORAL | 3 refills | Status: DC

## 2016-11-23 DIAGNOSIS — R32 Unspecified urinary incontinence: Secondary | ICD-10-CM | POA: Diagnosis not present

## 2016-11-23 DIAGNOSIS — I509 Heart failure, unspecified: Secondary | ICD-10-CM | POA: Diagnosis not present

## 2016-11-23 DIAGNOSIS — I110 Hypertensive heart disease with heart failure: Secondary | ICD-10-CM | POA: Diagnosis not present

## 2016-11-23 DIAGNOSIS — K219 Gastro-esophageal reflux disease without esophagitis: Secondary | ICD-10-CM | POA: Diagnosis not present

## 2016-11-23 DIAGNOSIS — I739 Peripheral vascular disease, unspecified: Secondary | ICD-10-CM | POA: Diagnosis not present

## 2016-11-23 DIAGNOSIS — J449 Chronic obstructive pulmonary disease, unspecified: Secondary | ICD-10-CM | POA: Diagnosis not present

## 2016-11-30 ENCOUNTER — Telehealth: Payer: Self-pay | Admitting: *Deleted

## 2016-11-30 DIAGNOSIS — I110 Hypertensive heart disease with heart failure: Secondary | ICD-10-CM | POA: Diagnosis not present

## 2016-11-30 DIAGNOSIS — J449 Chronic obstructive pulmonary disease, unspecified: Secondary | ICD-10-CM | POA: Diagnosis not present

## 2016-11-30 DIAGNOSIS — K219 Gastro-esophageal reflux disease without esophagitis: Secondary | ICD-10-CM | POA: Diagnosis not present

## 2016-11-30 DIAGNOSIS — I739 Peripheral vascular disease, unspecified: Secondary | ICD-10-CM | POA: Diagnosis not present

## 2016-11-30 DIAGNOSIS — I509 Heart failure, unspecified: Secondary | ICD-10-CM | POA: Diagnosis not present

## 2016-11-30 DIAGNOSIS — R32 Unspecified urinary incontinence: Secondary | ICD-10-CM | POA: Diagnosis not present

## 2016-11-30 NOTE — Telephone Encounter (Signed)
Linda Click from [place] [facility] questioned if Dr. Lacinda Axon would consider a script for generic miralax

## 2016-12-01 ENCOUNTER — Other Ambulatory Visit: Payer: Self-pay | Admitting: Family Medicine

## 2016-12-01 MED ORDER — POLYETHYLENE GLYCOL 3350 17 GM/SCOOP PO POWD: 17.0000 g | Freq: Every day | ORAL | 1 refills | Status: DC | PRN

## 2016-12-01 NOTE — Progress Notes (Signed)
mir

## 2016-12-01 NOTE — Telephone Encounter (Signed)
Sent!

## 2016-12-06 ENCOUNTER — Telehealth: Payer: Self-pay | Admitting: *Deleted

## 2016-12-06 DIAGNOSIS — K219 Gastro-esophageal reflux disease without esophagitis: Secondary | ICD-10-CM | POA: Diagnosis not present

## 2016-12-06 DIAGNOSIS — R32 Unspecified urinary incontinence: Secondary | ICD-10-CM | POA: Diagnosis not present

## 2016-12-06 DIAGNOSIS — J449 Chronic obstructive pulmonary disease, unspecified: Secondary | ICD-10-CM | POA: Diagnosis not present

## 2016-12-06 DIAGNOSIS — I509 Heart failure, unspecified: Secondary | ICD-10-CM | POA: Diagnosis not present

## 2016-12-06 DIAGNOSIS — I110 Hypertensive heart disease with heart failure: Secondary | ICD-10-CM | POA: Diagnosis not present

## 2016-12-06 DIAGNOSIS — I739 Peripheral vascular disease, unspecified: Secondary | ICD-10-CM | POA: Diagnosis not present

## 2016-12-06 NOTE — Telephone Encounter (Signed)
[place] [employer] advised of orders below

## 2016-12-06 NOTE — Telephone Encounter (Signed)
Linda [facility] reported pt having increase tiredness 2-3 days, pt has a elevated blood pressure 178/78 left arm and 180/78 in the right arm. Pt urine has a foul order. Linda requested orders to recheck pt's urine on tomorrow for a urinalysis C and S.  [employer] [phone]

## 2016-12-06 NOTE — Telephone Encounter (Signed)
Please triage

## 2016-12-06 NOTE — Telephone Encounter (Signed)
Reason for call: urine foul odor, elevated BP  Symptoms: foul odor, cloudy ,  increased fatigue 2-3 days, nausea Duration 2-3 days  Medications: Last seen for this problem: Seen by: Bp elevated 170/78 LA , 180/78 RA, she will re-check Bp tomorrow  Wants order urine , culture and sensitivity will do this tomorrow  Georgiann Cocker , [facility]

## 2016-12-06 NOTE — Telephone Encounter (Signed)
Okay for urine studies.

## 2016-12-07 ENCOUNTER — Telehealth: Payer: Self-pay | Admitting: Family Medicine

## 2016-12-07 DIAGNOSIS — K219 Gastro-esophageal reflux disease without esophagitis: Secondary | ICD-10-CM | POA: Diagnosis not present

## 2016-12-07 DIAGNOSIS — R5383 Other fatigue: Secondary | ICD-10-CM | POA: Diagnosis not present

## 2016-12-07 DIAGNOSIS — I509 Heart failure, unspecified: Secondary | ICD-10-CM | POA: Diagnosis not present

## 2016-12-07 DIAGNOSIS — R32 Unspecified urinary incontinence: Secondary | ICD-10-CM | POA: Diagnosis not present

## 2016-12-07 DIAGNOSIS — I110 Hypertensive heart disease with heart failure: Secondary | ICD-10-CM | POA: Diagnosis not present

## 2016-12-07 DIAGNOSIS — I739 Peripheral vascular disease, unspecified: Secondary | ICD-10-CM | POA: Diagnosis not present

## 2016-12-07 DIAGNOSIS — J449 Chronic obstructive pulmonary disease, unspecified: Secondary | ICD-10-CM | POA: Diagnosis not present

## 2016-12-07 DIAGNOSIS — Z87440 Personal history of urinary (tract) infections: Secondary | ICD-10-CM | POA: Diagnosis not present

## 2016-12-07 NOTE — Telephone Encounter (Signed)
BP well controlled.

## 2016-12-07 NOTE — Telephone Encounter (Signed)
eror

## 2016-12-07 NOTE — Telephone Encounter (Signed)
[name] called back with an update with pt's blood pressure. Bp on the right arm was 130/60 and 132/60 on the left.

## 2016-12-07 NOTE — Telephone Encounter (Signed)
Linda [employer] advised of below orders .

## 2016-12-19 ENCOUNTER — Other Ambulatory Visit: Payer: Self-pay | Admitting: Family Medicine

## 2016-12-21 ENCOUNTER — Telehealth: Payer: Self-pay | Admitting: Family Medicine

## 2016-12-21 DIAGNOSIS — I739 Peripheral vascular disease, unspecified: Secondary | ICD-10-CM | POA: Diagnosis not present

## 2016-12-21 DIAGNOSIS — R32 Unspecified urinary incontinence: Secondary | ICD-10-CM | POA: Diagnosis not present

## 2016-12-21 DIAGNOSIS — J449 Chronic obstructive pulmonary disease, unspecified: Secondary | ICD-10-CM | POA: Diagnosis not present

## 2016-12-21 DIAGNOSIS — K219 Gastro-esophageal reflux disease without esophagitis: Secondary | ICD-10-CM | POA: Diagnosis not present

## 2016-12-21 DIAGNOSIS — I509 Heart failure, unspecified: Secondary | ICD-10-CM | POA: Diagnosis not present

## 2016-12-21 DIAGNOSIS — I110 Hypertensive heart disease with heart failure: Secondary | ICD-10-CM | POA: Diagnosis not present

## 2016-12-21 NOTE — Telephone Encounter (Signed)
Call and schedule if she needs to be seen.

## 2016-12-21 NOTE — Telephone Encounter (Signed)
Linda Click [phone] called from [place] [facility] regarding drastic change in fatigue increased within the last two weeks. On Sunday pt stated she had a bad headache but then later she started having some yellow nasal drainage and the headache improved pt continues to have some yellow drainage and she's having post nasal drip and cannot cough it up no fever. Was the urine specimen results received? Do you want to see pt for an appt? Please call pt to sch. And let [employer] know. Thank you! VM can be left.

## 2016-12-21 NOTE — Telephone Encounter (Signed)
Spoke with patient and [facility] nurse. Patient has been scheduled for tomorrow morning at 8:30.

## 2016-12-21 NOTE — Telephone Encounter (Signed)
PA [id number] for dulera was started on cover my meds.

## 2016-12-22 ENCOUNTER — Encounter: Payer: Self-pay | Admitting: Family Medicine

## 2016-12-22 ENCOUNTER — Ambulatory Visit (INDEPENDENT_AMBULATORY_CARE_PROVIDER_SITE_OTHER): Payer: Medicare Other | Admitting: Family Medicine

## 2016-12-22 VITALS — BP 130/58 | HR 73 | Temp 98.2°F | Wt 181.4 lb

## 2016-12-22 DIAGNOSIS — R7303 Prediabetes: Secondary | ICD-10-CM

## 2016-12-22 DIAGNOSIS — R5383 Other fatigue: Secondary | ICD-10-CM | POA: Insufficient documentation

## 2016-12-22 DIAGNOSIS — E538 Deficiency of other specified B group vitamins: Secondary | ICD-10-CM | POA: Diagnosis not present

## 2016-12-22 LAB — COMPREHENSIVE METABOLIC PANEL
ALK PHOS: 68 U/L (ref 39–117)
ALT: 14 U/L (ref 0–35)
AST: 25 U/L (ref 0–37)
Albumin: 3.8 g/dL (ref 3.5–5.2)
BILIRUBIN TOTAL: 0.5 mg/dL (ref 0.2–1.2)
BUN: 18 mg/dL (ref 6–23)
CO2: 29 mEq/L (ref 19–32)
Calcium: 9.6 mg/dL (ref 8.4–10.5)
Chloride: 96 mEq/L (ref 96–112)
Creatinine, Ser: 1.04 mg/dL (ref 0.40–1.20)
GFR: 52.52 mL/min — ABNORMAL LOW (ref >60.00)
GLUCOSE: 100 mg/dL — AB (ref 70–99)
POTASSIUM: 4.3 meq/L (ref 3.5–5.1)
SODIUM: 132 meq/L — AB (ref 135–145)
TOTAL PROTEIN: 7 g/dL (ref 6.0–8.3)

## 2016-12-22 LAB — CBC
HCT: 38.9 % (ref 36.0–46.0)
Hemoglobin: 12.7 g/dL (ref 12.0–15.0)
MCHC: 32.7 g/dL (ref 30.0–36.0)
MCV: 97.4 fl (ref 78.0–100.0)
Platelets: 299 10*3/uL (ref 150.0–400.0)
RBC: 3.99 Mil/uL (ref 3.87–5.11)
RDW: 14 % (ref 11.5–15.5)
WBC: 7.6 10*3/uL (ref 4.0–10.5)

## 2016-12-22 LAB — HEMOGLOBIN A1C: Hgb A1c MFr Bld: 5.8 % (ref 4.6–6.5)

## 2016-12-22 LAB — VITAMIN B12: Vitamin B-12: >1500 pg/mL — ABNORMAL HIGH (ref 211–911)

## 2016-12-22 LAB — TSH: TSH: 2.6 u[IU]/mL (ref 0.35–4.50)

## 2016-12-22 NOTE — Telephone Encounter (Signed)
Prior authorization approved

## 2016-12-22 NOTE — Progress Notes (Signed)
Subjective:  Patient ID: Cindy Graham, female    DOB: 09-13-1921  Age: 83 y.o. MRN: [id number]  CC: Fatigue  HPI:  83 year old female with chronic kidney disease, chronic back pain, chronic cough, depression/anxiety, hyperlipidemia, hypertension, osteoarthritis, presents with complaints of fatigue.  Patient reports that she's had worsening fatigue over the past month. She's noted some associated increasing shortness of breath. She is sedentary. She reports limited mobility secondary to fatigue and chronic back pain as well as hip pain. She reports that she's had some recent postnasal drip. No urinary symptoms. No chest pain. She does note that she's had recent stresses. She's recently lost some family members and this has been weighing on her. No known exacerbating or relieving factors. No other associated symptoms. No other complaints or concerns this time.  Social Hx   Social History   Social History  . Marital status: Married    Spouse name: N/A  . Number of children: N/A  . Years of education: N/A   Social History Main Topics  . Smoking status: Never Smoker  . Smokeless tobacco: Never Used  . Alcohol use No  . Drug use: No  . Sexual activity: Not Currently   Other Topics Concern  . None   Social History Narrative  . None   Review of Systems  Constitutional: Positive for fatigue.  HENT: Positive for postnasal drip.   Respiratory: Positive for shortness of breath.    Objective:  BP (!) 130/58 (BP Location: Left Arm, Patient Position: Sitting, Cuff Size: Normal)   Pulse 73   Temp 98.2 F (36.8 C) (Oral)   Wt 181 lb 6 oz (82.3 kg)   SpO2 97%   BMI 32.13 kg/m   BP/Weight 12/22/2016 1/60/1093 06/26/5571  Systolic BP 220 254 270  Diastolic BP 58 58 68  Wt. (Lbs) 181.38 180.75 183.25  BMI 32.13 32.02 32.46    Physical Exam  Constitutional: She is oriented to person, place, and time.  Well-appearing, no acute distress.  HENT:  Head: Normocephalic and atraumatic.    Mouth/Throat: Oropharynx is clear and moist.  Eyes: Conjunctivae are normal.  Cardiovascular: Normal rate and regular rhythm.   Pulmonary/Chest: Effort normal and breath sounds normal. She has no wheezes. She has no rales.  Neurological: She is alert and oriented to person, place, and time.  Psychiatric: She has a normal mood and affect.  Vitals reviewed.   Lab Results  Component Value Date   WBC 7.4 11/26/2015   HGB 12.3 11/26/2015   HCT 37.8 11/26/2015   PLT 279.0 11/26/2015   GLUCOSE 95 11/26/2015   CHOL 214 (H) 07/16/2014   TRIG 207.0 (H) 07/16/2014   HDL 48.80 07/16/2014   LDLDIRECT 135.0 07/16/2014   LDLCALC 150 (H) 03/15/2013   ALT 12 11/26/2015   AST 23 11/26/2015   NA 137 11/26/2015   K 4.6 11/26/2015   CL 102 11/26/2015   CREATININE 1.15 11/26/2015   BUN 26 (H) 11/26/2015   CO2 28 11/26/2015   TSH 3.20 03/06/2015   HGBA1C 6.0 03/14/2013   MICROALBUR 0.7 01/15/2014    Assessment & Plan:   Problem List Items Addressed This Visit    Other fatigue - Primary    New problem. Uncertain etiology but likely multifactorial (deconditioning, depression, etc). Workup today with laboratory studies.      Relevant Orders   CBC   Comprehensive metabolic panel   TSH   Vitamin B12    Other Visit Diagnoses    Vitamin  B12 deficiency       Relevant Orders   Vitamin B12   Prediabetes       Relevant Orders   Hemoglobin A1c     Follow-up: Pending work up.  Vera [place]

## 2016-12-22 NOTE — Patient Instructions (Signed)
We will call with the results.  Follow up as needed.   Take care  Dr. Lacinda Axon

## 2016-12-22 NOTE — Assessment & Plan Note (Signed)
New problem. Uncertain etiology but likely multifactorial (deconditioning, depression, etc). Workup today with laboratory studies.

## 2016-12-26 ENCOUNTER — Encounter: Payer: Self-pay | Admitting: Family Medicine

## 2016-12-27 DIAGNOSIS — M1612 Unilateral primary osteoarthritis, left hip: Secondary | ICD-10-CM | POA: Diagnosis not present

## 2016-12-28 DIAGNOSIS — J449 Chronic obstructive pulmonary disease, unspecified: Secondary | ICD-10-CM | POA: Diagnosis not present

## 2016-12-28 DIAGNOSIS — R32 Unspecified urinary incontinence: Secondary | ICD-10-CM | POA: Diagnosis not present

## 2016-12-28 DIAGNOSIS — K219 Gastro-esophageal reflux disease without esophagitis: Secondary | ICD-10-CM | POA: Diagnosis not present

## 2016-12-28 DIAGNOSIS — I739 Peripheral vascular disease, unspecified: Secondary | ICD-10-CM | POA: Diagnosis not present

## 2016-12-28 DIAGNOSIS — I509 Heart failure, unspecified: Secondary | ICD-10-CM | POA: Diagnosis not present

## 2016-12-28 DIAGNOSIS — I110 Hypertensive heart disease with heart failure: Secondary | ICD-10-CM | POA: Diagnosis not present

## 2017-01-06 DIAGNOSIS — I110 Hypertensive heart disease with heart failure: Secondary | ICD-10-CM | POA: Diagnosis not present

## 2017-01-06 DIAGNOSIS — I509 Heart failure, unspecified: Secondary | ICD-10-CM | POA: Diagnosis not present

## 2017-01-06 DIAGNOSIS — J449 Chronic obstructive pulmonary disease, unspecified: Secondary | ICD-10-CM | POA: Diagnosis not present

## 2017-01-06 DIAGNOSIS — I739 Peripheral vascular disease, unspecified: Secondary | ICD-10-CM | POA: Diagnosis not present

## 2017-01-06 DIAGNOSIS — R32 Unspecified urinary incontinence: Secondary | ICD-10-CM | POA: Diagnosis not present

## 2017-01-06 DIAGNOSIS — K219 Gastro-esophageal reflux disease without esophagitis: Secondary | ICD-10-CM | POA: Diagnosis not present

## 2017-01-13 ENCOUNTER — Other Ambulatory Visit: Payer: Self-pay | Admitting: Family Medicine

## 2017-01-16 ENCOUNTER — Telehealth: Payer: Self-pay | Admitting: *Deleted

## 2017-01-16 NOTE — Telephone Encounter (Signed)
Cindy Graham from [facility] has requested to have verbal order renewed for home health [place] [phone]

## 2017-01-16 NOTE — Telephone Encounter (Signed)
Verbal order can be given.  

## 2017-01-16 NOTE — Telephone Encounter (Signed)
Please advise 

## 2017-01-16 NOTE — Telephone Encounter (Signed)
Verbal order given  

## 2017-01-18 DIAGNOSIS — K219 Gastro-esophageal reflux disease without esophagitis: Secondary | ICD-10-CM | POA: Diagnosis not present

## 2017-01-18 DIAGNOSIS — I509 Heart failure, unspecified: Secondary | ICD-10-CM | POA: Diagnosis not present

## 2017-01-18 DIAGNOSIS — R32 Unspecified urinary incontinence: Secondary | ICD-10-CM | POA: Diagnosis not present

## 2017-01-18 DIAGNOSIS — J449 Chronic obstructive pulmonary disease, unspecified: Secondary | ICD-10-CM | POA: Diagnosis not present

## 2017-01-18 DIAGNOSIS — I110 Hypertensive heart disease with heart failure: Secondary | ICD-10-CM | POA: Diagnosis not present

## 2017-01-18 DIAGNOSIS — I739 Peripheral vascular disease, unspecified: Secondary | ICD-10-CM | POA: Diagnosis not present

## 2017-01-25 ENCOUNTER — Ambulatory Visit (INDEPENDENT_AMBULATORY_CARE_PROVIDER_SITE_OTHER): Payer: Medicare Other

## 2017-01-25 VITALS — BP 128/62 | HR 70 | Temp 98.1°F | Resp 14 | Ht 59.0 in | Wt 184.1 lb

## 2017-01-25 DIAGNOSIS — Z0000 Encounter for general adult medical examination without abnormal findings: Secondary | ICD-10-CM | POA: Diagnosis not present

## 2017-01-25 NOTE — Patient Instructions (Addendum)
  Cindy Graham , Thank you for taking time to come for your Medicare Wellness Visit. I appreciate your ongoing commitment to your health goals. Please review the following plan we discussed and let me know if I can assist you in the future.   These are the goals we discussed: Goals    . Healthy Lifestyle          Walk for exercise, as tolerated Drink plenty of water, stay hydrated Choose low carb foods, fruits and vegetables        This is a list of the screening recommended for you and due dates:  Health Maintenance  Topic Date Due  . Tetanus Vaccine  08/10/1942  . Flu Shot  12/21/2016  . DEXA scan (bone density measurement)  Completed  . Pneumonia vaccines  Completed

## 2017-01-25 NOTE — Progress Notes (Signed)
Subjective:   Cindy Graham is a 83 y.o. female who presents for Medicare Annual (Subsequent) preventive examination.  Review of Systems:  No ROS.  Medicare Wellness Visit. Additional risk factors are reflected in the social history.  Cardiac Risk Factors include: advanced age (>40men, >98 women);hypertension     Objective:     Vitals: BP 128/62 (BP Location: Left Arm, Patient Position: Sitting, Cuff Size: Normal)   Pulse 70   Temp 98.1 F (36.7 C) (Oral)   Resp 14   Ht 4\' 11"  (1.499 m)   Wt 184 lb 1.9 oz (83.5 kg)   SpO2 95%   BMI 37.19 kg/m   Body mass index is 37.19 kg/m.   Tobacco History  Smoking Status  . Never Smoker  Smokeless Tobacco  . Never Used     Counseling given: Not Answered   Past Medical History:  Diagnosis Date  . Arthritis    osteo  . COPD (chronic obstructive pulmonary disease) ([place])   . GERD (gastroesophageal reflux disease)   . Heart murmur   . Hernia   . Hyperlipidemia   . Hypertension   . Melanoma ([place])    face  . Syncope and collapse   . Urine incontinence    Past Surgical History:  Procedure Laterality Date  . ABDOMINAL HYSTERECTOMY    . CHOLECYSTECTOMY    . HERNIA REPAIR     Family History  Problem Relation Age of Onset  . Stroke Father   . Heart disease Sister   . Cancer Brother        colon  . Heart disease Brother   . Cancer Sister        colon  . Heart attack Brother   . Heart attack Son   . Parkinson's disease Son    History  Sexual Activity  . Sexual activity: Not Currently    Outpatient Encounter Prescriptions as of 01/25/2017  Medication Sig  . aspirin 81 MG tablet Take 81 mg by mouth daily.    . Biotin 1000 MCG tablet Take 1,000 mcg by mouth daily.    . cholecalciferol (VITAMIN D) 400 UNITS TABS Take 400 Units by mouth 2 (two) times daily.    . DULERA 200-5 MCG/ACT AERO INHALE 2 PUFFS INTO THE LUNGS TWO TIMES PER DAY  . enalapril (VASOTEC) 20 MG tablet TAKE ONE (1) TABLET BY MOUTH EVERY DAY  .  Flaxseed, Linseed, (FLAX SEED OIL) 1000 MG CAPS Take 1 capsule by mouth daily.    Marland Kitchen gabapentin (NEURONTIN) 300 MG capsule TAKE ONE (1) CAPSULE EACH DAY  . Multiple Vitamin (MULTIVITAMIN) capsule Take 1 capsule by mouth daily.    . nitroGLYCERIN (NITROSTAT) 0.3 MG SL tablet DISSOLVE 1 TABLET UNDER TONGUE EVERY 5 MINUTES FOR 3 DOSES AS NEEDED FOR CHEST PAIN. IF NO RELIEF GO TO ER.  . NUCYNTA 50 MG TABS tablet Take 50 mg by mouth 2 (two) times daily as needed (Take 1 tablet twice a day as needed).   . nystatin cream (MYCOSTATIN) Apply 1 application topically 2 (two) times daily.  Marland Kitchen omeprazole (PRILOSEC) 20 MG capsule TAKE ONE (1) CAPSULE EACH DAY  . oxybutynin (DITROPAN) 5 MG tablet TAKE ONE TABLET BY MOUTH EVERY DAY  . polyethylene glycol powder (GLYCOLAX/MIRALAX) powder Take 17 g by mouth daily as needed.  . propranolol ER (INDERAL LA) 60 MG 24 hr capsule TAKE 1 CAPSULE BY MOUTH EVERY DAY  . sertraline (ZOLOFT) 100 MG tablet TAKE ONE (1) TABLET EACH DAY  .  triamterene-hydrochlorothiazide (DYAZIDE) 37.5-25 MG capsule TAKE ONE CAPSULE BY MOUTH DAILY  . verapamil (CALAN-SR) 180 MG CR tablet TAKE ONE TABLET BY MOUTH EVERY NIGHT AT BEDTIME   No facility-administered encounter medications on file as of 01/25/2017.     Activities of Daily Living In your present state of health, do you have any difficulty performing the following activities: 01/25/2017  Hearing? Y  Comment Hearing aid  Vision? N  Difficulty concentrating or making decisions? N  Walking or climbing stairs? Y  Dressing or bathing? Y  Doing errands, shopping? Y  Preparing Food and eating ? Y  Using the Toilet? N  In the past six months, have you accidently leaked urine? N  Do you have problems with loss of bowel control? N  Managing your Medications? N  Managing your Finances? N  Housekeeping or managing your Housekeeping? Y  Some recent data might be hidden    Patient Care Team: Leone Haven, MD as PCP - General (Family  Medicine)    Assessment:    This is a routine wellness examination for  Lin. The goal of the wellness visit is to assist the patient how to close the gaps in care and create a preventative care plan for the patient.   The roster of all physicians providing medical care to patient is listed in the Snapshot section of the chart.  Taking calcium VIT D as appropriate/Osteoporosis risk reviewed.    Safety issues reviewed; Life alert, smoke and carbon monoxide detectors in the home. No firearms in the home.  Wears seatbelts when riding with others. Patient does wear sunscreen or protective clothing when in direct sunlight. No violence in the home.  Patient is alert, normal appearance, oriented to person/place/and time.  Correctly identified the president of the [place], recall of 3/3 words, and performing simple calculations. Displays appropriate judgement and can read correct time from watch face.   No new identified risk were noted.  No failures at ADL's or IADL's.  Ambulates with walker.  BMI- discussed the importance of a healthy diet, water intake and the benefits of aerobic exercise. Educational material provided.   Daily fluid intake: 0 cups of caffeine, 6-8 cups of water  Dental- dentures.  Eye- Visual acuity not assessed per patient preference since they have regular follow up with the ophthalmologist.  Wears corrective lenses.  Sleep patterns- Sleeps 6 hours at night.  2L Oxygen at night. Wakes feeling rested.    TDAP vaccine deferred per patient preference.  Follow up with insurance.  Educational material provided.  Influenza vaccine; she plans to receive it from [facility], will notify when completed.  Patient Concerns: None at this time. Follow up with PCP as needed.  Exercise Activities and Dietary recommendations Current Exercise Habits: The patient does not participate in regular exercise at present  Goals    . Healthy Lifestyle          Walk for exercise, as  tolerated Drink plenty of water, stay hydrated Choose low carb foods, fruits and vegetables       Fall Risk Fall Risk  01/25/2017 11/26/2015 07/14/2015 07/16/2014 01/15/2014  Falls in the past year? Yes No No No No  Number falls in past yr: 1 - - - -  Follow up Falls prevention discussed;Education provided - - - -   Depression Screen PHQ 2/9 Scores 01/25/2017 11/26/2015 07/14/2015 07/16/2014  PHQ - 2 Score 0 0 0 0  PHQ- 9 Score 0 - - -  Cognitive Function MMSE - Mini Mental State Exam 07/14/2015  Orientation to time 5  Orientation to Place 5  Registration 3  Attention/ Calculation 5  Recall 2  Recall-comments 2/3 recalled  Language- name 2 objects 2  Language- repeat 1  Language- follow 3 step command 3  Language- read & follow direction 1  Write a sentence 0  Write a sentence-comments Difficulty writing. Tremors.  Copy design 1  Total score 28     6CIT Screen 01/25/2017  What Year? 0 points  What month? 0 points  What time? 0 points  Count back from 20 0 points  Months in reverse 0 points  Repeat phrase 0 points  Total Score 0    Immunization History  Administered Date(s) Administered  . Influenza Split 02/25/2011, 02/14/2012, 02/25/2015  . Influenza-Unspecified 02/20/2013, 03/15/2014, 01/22/2015, 03/01/2016  . Pneumococcal Conjugate-13 06/28/2013  . Pneumococcal Polysaccharide-23 10/25/2001   Screening Tests Health Maintenance  Topic Date Due  . TETANUS/TDAP  08/10/1942  . INFLUENZA VACCINE  12/21/2016  . DEXA SCAN  Completed  . PNA vac Low Risk Adult  Completed      Plan:    End of life planning; Advance aging; Advanced directives discussed. Copy of current HCPOA/Living Will requested.    I have personally reviewed and noted the following in the patient's chart:   . Medical and social history . Use of alcohol, tobacco or illicit drugs  . Current medications and supplements . Functional ability and status . Nutritional status . Physical  activity . Advanced directives . List of other physicians . Hospitalizations, surgeries, and ER visits in previous 12 months . Vitals . Screenings to include cognitive, depression, and falls . Referrals and appointments  In addition, I have reviewed and discussed with patient certain preventive protocols, quality metrics, and best practice recommendations. A written personalized care plan for preventive services as well as general preventive health recommendations were provided to patient.     Varney Biles, LPN  01/21/4781   Reviewed above information.  Agree with assessment and plan.  Dr Nicki Reaper

## 2017-02-14 ENCOUNTER — Telehealth: Payer: Self-pay | Admitting: *Deleted

## 2017-02-14 NOTE — Telephone Encounter (Signed)
Please advise 

## 2017-02-14 NOTE — Telephone Encounter (Signed)
Cindy Graham  from [facility] requested a script for a raised toilet with grab bars  Contact [phone] Fax [phone]

## 2017-02-15 ENCOUNTER — Telehealth: Payer: Self-pay | Admitting: Family Medicine

## 2017-02-15 NOTE — Telephone Encounter (Signed)
Cindy Graham from [facility] called and stated that pt has a area on her rt outer ankle that has a pin point opening, tender, red, and swelling. Pt is scheduled for 9/28 @ 8 am. Please advise, thank you!  Call [facility] @ [phone]

## 2017-02-15 NOTE — Telephone Encounter (Signed)
They declined urgent care. [facility] nurse says that the opening is on a bony prominence and shows all classic signs of pressure ulcer. She has instructed patient to turn and reposition and float heels while sitting or in bed for long periods of time. She will see you on Friday unless she develops any acute symptoms.

## 2017-02-16 ENCOUNTER — Other Ambulatory Visit: Payer: Self-pay | Admitting: Family Medicine

## 2017-02-16 NOTE — Telephone Encounter (Signed)
Noted  

## 2017-02-17 ENCOUNTER — Encounter: Payer: Self-pay | Admitting: Family Medicine

## 2017-02-17 ENCOUNTER — Ambulatory Visit (INDEPENDENT_AMBULATORY_CARE_PROVIDER_SITE_OTHER): Payer: Medicare Other | Admitting: Family Medicine

## 2017-02-17 ENCOUNTER — Ambulatory Visit
Admission: RE | Admit: 2017-02-17 | Discharge: 2017-02-17 | Disposition: A | Discharge status: Home / Self Care | Payer: Medicare Other | Source: Ambulatory Visit | Attending: Family Medicine | Admitting: Family Medicine

## 2017-02-17 VITALS — BP 148/60 | HR 81 | Temp 97.9°F | Wt 184.0 lb

## 2017-02-17 DIAGNOSIS — L89511 Pressure ulcer of right ankle, stage 1: Secondary | ICD-10-CM

## 2017-02-17 DIAGNOSIS — M7989 Other specified soft tissue disorders: Secondary | ICD-10-CM

## 2017-02-17 DIAGNOSIS — R600 Localized edema: Secondary | ICD-10-CM | POA: Diagnosis not present

## 2017-02-17 DIAGNOSIS — L89509 Pressure ulcer of unspecified ankle, unspecified stage: Secondary | ICD-10-CM | POA: Insufficient documentation

## 2017-02-17 NOTE — Progress Notes (Signed)
  Tommi Rumps, MD Phone: [phone]  Cindy Graham is a 83 y.o. female who presents today for same-day visit.  Patient notes for the last 2 weeks she's had a spot on her right ankle over the lateral malleolus. It's a slight area of erythema with minimal skin breakdown. She notes at times over the last couple of days that her right leg below her knee will swell significantly and then come down a little bit and then the next day will swell again. She notes the erythema moves up her leg with this swelling and then resolves. She notes the erythema has resolved at this time other than at the small spot. She notes there is no pain other than with palpation of this area. She does sleep on her right side. She's not had fevers. She has no history of significant swelling in her right leg.   ROS see history of present illness  Objective  Physical Exam Vitals:   02/17/17 0812  BP: (!) 148/60  Pulse: 81  Temp: 97.9 F (36.6 C)  SpO2: 96%    BP Readings from Last 3 Encounters:  02/17/17 (!) 148/60  01/25/17 128/62  12/22/16 (!) 130/58   Wt Readings from Last 3 Encounters:  02/17/17 184 lb (83.5 kg)  01/25/17 184 lb 1.9 oz (83.5 kg)  12/22/16 181 lb 6 oz (82.3 kg)    Physical Exam  Constitutional: No distress.  Cardiovascular: Normal rate, regular rhythm and normal heart sounds.   Pulmonary/Chest: Effort normal and breath sounds normal.  Musculoskeletal:       Feet:  Right lower extremity from the knee down does appear to be swollen compared to the left, right calf measures 32 cm, left calf measures 29 cm, no calf tenderness or cords bilaterally  Skin: She is not diaphoretic.     Assessment/Plan: Please see individual problem list.  Pressure ulcer of ankle Suspect pressure ulcer of right ankle. Lesion is consistent with this. Unlikely infection given that erythema proceeds when she does have it and has not progressed. Discussed floating or ankle and using a pillow to take  pressure off of this area. She'll monitor and if she has any progression of erythema or develops fever or any signs of infection she'll be reevaluated. She does have home health and we will see if they can keep an eye on this. She does have a fair bit of swelling in the right lower extremity and thus we will obtain a Doppler to rule out DVT as a contributing cause. She's given return precautions.   Orders Placed This Encounter  Procedures  . US Venous Img Lower Unilateral Right    Standing Status:   Future    Number of Occurrences:   1    Standing Expiration Date:   04/19/2018    Order Specific Question:   Reason for Exam (SYMPTOM  OR DIAGNOSIS REQUIRED)    Answer:   RLE swelling, pain near ankle    Order Specific Question:   Preferred imaging location?    Answer:   [facility]    Order Specific Question:   Call Results- Best Contact Number?    Answer:   [phone]-hold patient    Tommi Rumps, MD [place]

## 2017-02-17 NOTE — Progress Notes (Signed)
faxed

## 2017-02-17 NOTE — Progress Notes (Signed)
Patient states she would like to call us to make a 2 week follow up appmt, informed home health nurse to monitor spot on foot, do you want wound care as well? Fax # [phone] [name]

## 2017-02-17 NOTE — Assessment & Plan Note (Signed)
Suspect pressure ulcer of right ankle. Lesion is consistent with this. Unlikely infection given that erythema proceeds when she does have it and has not progressed. Discussed floating or ankle and using a pillow to take pressure off of this area. She'll monitor and if she has any progression of erythema or develops fever or any signs of infection she'll be reevaluated. She does have home health and we will see if they can keep an eye on this. She does have a fair bit of swelling in the right lower extremity and thus we will obtain a Doppler to rule out DVT as a contributing cause. She's given return precautions.

## 2017-02-17 NOTE — Patient Instructions (Addendum)
Nice to see you. We are going to get an ultrasound of her leg to rule out DVT. Please float your ankle as discussed on a pillow to help the pressure ulcer improve. If you develop spreading redness, worsening swelling, fevers, or any new or changing symptoms please seek medical attention.

## 2017-02-19 NOTE — Telephone Encounter (Signed)
Order pended. Please contact them to see what diagnosis they need this for. I need to have a diagnosis to attach to the order. Thanks.

## 2017-02-20 NOTE — Telephone Encounter (Signed)
Prescription written.  Please fax.

## 2017-02-20 NOTE — Telephone Encounter (Signed)
faxed

## 2017-02-20 NOTE — Telephone Encounter (Signed)
Cindy Graham states she has difficulty ambulating and she is incontinent, she states they usually do not need a diagnosis code.

## 2017-02-23 ENCOUNTER — Telehealth: Payer: Self-pay | Admitting: Family Medicine

## 2017-02-23 DIAGNOSIS — I739 Peripheral vascular disease, unspecified: Secondary | ICD-10-CM | POA: Diagnosis not present

## 2017-02-23 DIAGNOSIS — Z87440 Personal history of urinary (tract) infections: Secondary | ICD-10-CM | POA: Diagnosis not present

## 2017-02-23 DIAGNOSIS — L89512 Pressure ulcer of right ankle, stage 2: Secondary | ICD-10-CM | POA: Diagnosis not present

## 2017-02-23 DIAGNOSIS — Z8701 Personal history of pneumonia (recurrent): Secondary | ICD-10-CM | POA: Diagnosis not present

## 2017-02-23 DIAGNOSIS — I509 Heart failure, unspecified: Secondary | ICD-10-CM | POA: Diagnosis not present

## 2017-02-23 DIAGNOSIS — Z7982 Long term (current) use of aspirin: Secondary | ICD-10-CM | POA: Diagnosis not present

## 2017-02-23 DIAGNOSIS — R32 Unspecified urinary incontinence: Secondary | ICD-10-CM | POA: Diagnosis not present

## 2017-02-23 DIAGNOSIS — Z9981 Dependence on supplemental oxygen: Secondary | ICD-10-CM | POA: Diagnosis not present

## 2017-02-23 DIAGNOSIS — Z742 Need for assistance at home and no other household member able to render care: Secondary | ICD-10-CM | POA: Diagnosis not present

## 2017-02-23 DIAGNOSIS — J449 Chronic obstructive pulmonary disease, unspecified: Secondary | ICD-10-CM | POA: Diagnosis not present

## 2017-02-23 DIAGNOSIS — I110 Hypertensive heart disease with heart failure: Secondary | ICD-10-CM | POA: Diagnosis not present

## 2017-02-23 DIAGNOSIS — K219 Gastro-esophageal reflux disease without esophagitis: Secondary | ICD-10-CM | POA: Diagnosis not present

## 2017-02-23 NOTE — Telephone Encounter (Signed)
LINDA FROM [facility] IS WANTING TO KNOW IF DR. SONNENBERG CAN ORDER PT FOR THE PATIENT. SHE STATES THAT IT IS FOR HER LOWER BACK PAIN THAT SHE SEE'S DR. Sharlet Salina FOR, BUT WOULD LIKE TO SEE IF PT CAN OFFER ANY OTHER TYPE SUGGESTIONS FOR HER PAIN MANAGEMENT.  LINDA [phone]

## 2017-02-23 NOTE — Telephone Encounter (Signed)
Please advise 

## 2017-02-25 NOTE — Telephone Encounter (Signed)
Is this for home health PT?

## 2017-02-27 NOTE — Telephone Encounter (Signed)
Left message to return call 

## 2017-03-01 DIAGNOSIS — I739 Peripheral vascular disease, unspecified: Secondary | ICD-10-CM | POA: Diagnosis not present

## 2017-03-01 DIAGNOSIS — J449 Chronic obstructive pulmonary disease, unspecified: Secondary | ICD-10-CM | POA: Diagnosis not present

## 2017-03-01 DIAGNOSIS — I110 Hypertensive heart disease with heart failure: Secondary | ICD-10-CM | POA: Diagnosis not present

## 2017-03-01 DIAGNOSIS — L89512 Pressure ulcer of right ankle, stage 2: Secondary | ICD-10-CM | POA: Diagnosis not present

## 2017-03-01 DIAGNOSIS — K219 Gastro-esophageal reflux disease without esophagitis: Secondary | ICD-10-CM | POA: Diagnosis not present

## 2017-03-01 DIAGNOSIS — I509 Heart failure, unspecified: Secondary | ICD-10-CM | POA: Diagnosis not present

## 2017-03-01 NOTE — Telephone Encounter (Signed)
Verbal given for home health pt

## 2017-03-07 ENCOUNTER — Telehealth: Payer: Self-pay

## 2017-03-07 NOTE — Telephone Encounter (Signed)
Verbal order can be given.  

## 2017-03-07 NOTE — Telephone Encounter (Signed)
[facility] [phone] Rip Harbour calling to extend PT evaluation for Thursday end date on order is  tomorrow .

## 2017-03-08 NOTE — Telephone Encounter (Signed)
Verbal order given as stated below

## 2017-03-09 ENCOUNTER — Telehealth: Payer: Self-pay | Admitting: Family Medicine

## 2017-03-09 DIAGNOSIS — I110 Hypertensive heart disease with heart failure: Secondary | ICD-10-CM | POA: Diagnosis not present

## 2017-03-09 DIAGNOSIS — J449 Chronic obstructive pulmonary disease, unspecified: Secondary | ICD-10-CM | POA: Diagnosis not present

## 2017-03-09 DIAGNOSIS — I509 Heart failure, unspecified: Secondary | ICD-10-CM | POA: Diagnosis not present

## 2017-03-09 DIAGNOSIS — L89512 Pressure ulcer of right ankle, stage 2: Secondary | ICD-10-CM | POA: Diagnosis not present

## 2017-03-09 DIAGNOSIS — I739 Peripheral vascular disease, unspecified: Secondary | ICD-10-CM | POA: Diagnosis not present

## 2017-03-09 DIAGNOSIS — K219 Gastro-esophageal reflux disease without esophagitis: Secondary | ICD-10-CM | POA: Diagnosis not present

## 2017-03-09 NOTE — Telephone Encounter (Signed)
Cindy Graham from [facility] called looking for verbals orders for 1x1 2x2 1x3. Please advise, thank you!  Call @ [phone]

## 2017-03-09 NOTE — Telephone Encounter (Signed)
Spoke with Raquel Sarna, gave verbal orders, thanks

## 2017-03-09 NOTE — Telephone Encounter (Signed)
Verbal orders can be given. 

## 2017-03-09 NOTE — Telephone Encounter (Signed)
Please advise if you are ok with this, thanks

## 2017-03-10 DIAGNOSIS — L89512 Pressure ulcer of right ankle, stage 2: Secondary | ICD-10-CM | POA: Diagnosis not present

## 2017-03-10 DIAGNOSIS — I739 Peripheral vascular disease, unspecified: Secondary | ICD-10-CM | POA: Diagnosis not present

## 2017-03-10 DIAGNOSIS — I509 Heart failure, unspecified: Secondary | ICD-10-CM | POA: Diagnosis not present

## 2017-03-10 DIAGNOSIS — I110 Hypertensive heart disease with heart failure: Secondary | ICD-10-CM | POA: Diagnosis not present

## 2017-03-10 DIAGNOSIS — M5126 Other intervertebral disc displacement, lumbar region: Secondary | ICD-10-CM | POA: Diagnosis not present

## 2017-03-10 DIAGNOSIS — K219 Gastro-esophageal reflux disease without esophagitis: Secondary | ICD-10-CM | POA: Diagnosis not present

## 2017-03-10 DIAGNOSIS — J449 Chronic obstructive pulmonary disease, unspecified: Secondary | ICD-10-CM | POA: Diagnosis not present

## 2017-03-10 DIAGNOSIS — Z23 Encounter for immunization: Secondary | ICD-10-CM | POA: Diagnosis not present

## 2017-03-10 DIAGNOSIS — M1711 Unilateral primary osteoarthritis, right knee: Secondary | ICD-10-CM | POA: Diagnosis not present

## 2017-03-10 DIAGNOSIS — M5416 Radiculopathy, lumbar region: Secondary | ICD-10-CM | POA: Diagnosis not present

## 2017-03-10 DIAGNOSIS — M7062 Trochanteric bursitis, left hip: Secondary | ICD-10-CM | POA: Diagnosis not present

## 2017-03-13 ENCOUNTER — Telehealth: Payer: Self-pay | Admitting: Family Medicine

## 2017-03-13 DIAGNOSIS — N183 Chronic kidney disease, stage 3 (moderate): Secondary | ICD-10-CM | POA: Diagnosis not present

## 2017-03-13 DIAGNOSIS — N3941 Urge incontinence: Secondary | ICD-10-CM | POA: Diagnosis not present

## 2017-03-13 DIAGNOSIS — F325 Major depressive disorder, single episode, in full remission: Secondary | ICD-10-CM | POA: Diagnosis not present

## 2017-03-13 DIAGNOSIS — K219 Gastro-esophageal reflux disease without esophagitis: Secondary | ICD-10-CM | POA: Diagnosis not present

## 2017-03-13 DIAGNOSIS — I10 Essential (primary) hypertension: Secondary | ICD-10-CM | POA: Diagnosis not present

## 2017-03-13 NOTE — Telephone Encounter (Signed)
Please advise 

## 2017-03-13 NOTE — Telephone Encounter (Signed)
Rip Harbour [phone] called from [facility] regarding a Plan of Care it was faxed ib 03/08/2017. She will refax again today. Fax number [phone]

## 2017-03-14 DIAGNOSIS — I110 Hypertensive heart disease with heart failure: Secondary | ICD-10-CM | POA: Diagnosis not present

## 2017-03-14 DIAGNOSIS — I739 Peripheral vascular disease, unspecified: Secondary | ICD-10-CM | POA: Diagnosis not present

## 2017-03-14 DIAGNOSIS — L89512 Pressure ulcer of right ankle, stage 2: Secondary | ICD-10-CM | POA: Diagnosis not present

## 2017-03-14 DIAGNOSIS — I509 Heart failure, unspecified: Secondary | ICD-10-CM | POA: Diagnosis not present

## 2017-03-14 DIAGNOSIS — K219 Gastro-esophageal reflux disease without esophagitis: Secondary | ICD-10-CM | POA: Diagnosis not present

## 2017-03-14 DIAGNOSIS — J449 Chronic obstructive pulmonary disease, unspecified: Secondary | ICD-10-CM | POA: Diagnosis not present

## 2017-03-15 DIAGNOSIS — I509 Heart failure, unspecified: Secondary | ICD-10-CM | POA: Diagnosis not present

## 2017-03-15 DIAGNOSIS — I739 Peripheral vascular disease, unspecified: Secondary | ICD-10-CM | POA: Diagnosis not present

## 2017-03-15 DIAGNOSIS — J449 Chronic obstructive pulmonary disease, unspecified: Secondary | ICD-10-CM | POA: Diagnosis not present

## 2017-03-15 DIAGNOSIS — K219 Gastro-esophageal reflux disease without esophagitis: Secondary | ICD-10-CM | POA: Diagnosis not present

## 2017-03-15 DIAGNOSIS — I110 Hypertensive heart disease with heart failure: Secondary | ICD-10-CM | POA: Diagnosis not present

## 2017-03-15 DIAGNOSIS — L89512 Pressure ulcer of right ankle, stage 2: Secondary | ICD-10-CM | POA: Diagnosis not present

## 2017-03-15 NOTE — Telephone Encounter (Signed)
faxed

## 2017-03-15 NOTE — Telephone Encounter (Signed)
Signed. Please fax.

## 2017-03-16 DIAGNOSIS — K219 Gastro-esophageal reflux disease without esophagitis: Secondary | ICD-10-CM | POA: Diagnosis not present

## 2017-03-16 DIAGNOSIS — I509 Heart failure, unspecified: Secondary | ICD-10-CM | POA: Diagnosis not present

## 2017-03-16 DIAGNOSIS — J449 Chronic obstructive pulmonary disease, unspecified: Secondary | ICD-10-CM | POA: Diagnosis not present

## 2017-03-16 DIAGNOSIS — L89512 Pressure ulcer of right ankle, stage 2: Secondary | ICD-10-CM | POA: Diagnosis not present

## 2017-03-16 DIAGNOSIS — I110 Hypertensive heart disease with heart failure: Secondary | ICD-10-CM | POA: Diagnosis not present

## 2017-03-16 DIAGNOSIS — I739 Peripheral vascular disease, unspecified: Secondary | ICD-10-CM | POA: Diagnosis not present

## 2017-03-21 DIAGNOSIS — K219 Gastro-esophageal reflux disease without esophagitis: Secondary | ICD-10-CM | POA: Diagnosis not present

## 2017-03-21 DIAGNOSIS — J449 Chronic obstructive pulmonary disease, unspecified: Secondary | ICD-10-CM | POA: Diagnosis not present

## 2017-03-21 DIAGNOSIS — I509 Heart failure, unspecified: Secondary | ICD-10-CM | POA: Diagnosis not present

## 2017-03-21 DIAGNOSIS — L89512 Pressure ulcer of right ankle, stage 2: Secondary | ICD-10-CM | POA: Diagnosis not present

## 2017-03-21 DIAGNOSIS — I110 Hypertensive heart disease with heart failure: Secondary | ICD-10-CM | POA: Diagnosis not present

## 2017-03-21 DIAGNOSIS — I739 Peripheral vascular disease, unspecified: Secondary | ICD-10-CM | POA: Diagnosis not present

## 2017-03-22 DIAGNOSIS — L89512 Pressure ulcer of right ankle, stage 2: Secondary | ICD-10-CM | POA: Diagnosis not present

## 2017-03-22 DIAGNOSIS — I739 Peripheral vascular disease, unspecified: Secondary | ICD-10-CM | POA: Diagnosis not present

## 2017-03-22 DIAGNOSIS — K219 Gastro-esophageal reflux disease without esophagitis: Secondary | ICD-10-CM | POA: Diagnosis not present

## 2017-03-22 DIAGNOSIS — J449 Chronic obstructive pulmonary disease, unspecified: Secondary | ICD-10-CM | POA: Diagnosis not present

## 2017-03-22 DIAGNOSIS — I509 Heart failure, unspecified: Secondary | ICD-10-CM | POA: Diagnosis not present

## 2017-03-22 DIAGNOSIS — I110 Hypertensive heart disease with heart failure: Secondary | ICD-10-CM | POA: Diagnosis not present

## 2017-03-23 ENCOUNTER — Telehealth: Payer: Self-pay

## 2017-03-23 DIAGNOSIS — I509 Heart failure, unspecified: Secondary | ICD-10-CM | POA: Diagnosis not present

## 2017-03-23 DIAGNOSIS — I110 Hypertensive heart disease with heart failure: Secondary | ICD-10-CM | POA: Diagnosis not present

## 2017-03-23 DIAGNOSIS — N39498 Other specified urinary incontinence: Secondary | ICD-10-CM | POA: Diagnosis not present

## 2017-03-23 DIAGNOSIS — J449 Chronic obstructive pulmonary disease, unspecified: Secondary | ICD-10-CM | POA: Diagnosis not present

## 2017-03-23 DIAGNOSIS — L89512 Pressure ulcer of right ankle, stage 2: Secondary | ICD-10-CM | POA: Diagnosis not present

## 2017-03-23 DIAGNOSIS — N390 Urinary tract infection, site not specified: Secondary | ICD-10-CM | POA: Diagnosis not present

## 2017-03-23 DIAGNOSIS — I739 Peripheral vascular disease, unspecified: Secondary | ICD-10-CM | POA: Diagnosis not present

## 2017-03-23 DIAGNOSIS — K219 Gastro-esophageal reflux disease without esophagitis: Secondary | ICD-10-CM | POA: Diagnosis not present

## 2017-03-23 DIAGNOSIS — Z87440 Personal history of urinary (tract) infections: Secondary | ICD-10-CM | POA: Diagnosis not present

## 2017-03-23 NOTE — Telephone Encounter (Signed)
Linda from [facility] called and states patients urine has an odor, she is incontinent at night, slight confusion. Per [name], in the past patient has had confusion with uti's. She states it is difficult for patient to come in and have family bring her. Can she have a verbal for a clean catch urine specimen. Pressure ulcer has almost cleared up completely. [phone].

## 2017-03-23 NOTE — Telephone Encounter (Signed)
Spoke to patient and informed her that she will need to be evaluated due to her symptoms. Patient states she is not sure if she can get evaluated, patient sounded very confused on the phone. Informed her she would need to be evaluated today. Patient states she will see if she can but she is not sure. Due to confusion, I called [name], [facility] nurse,to notify her that the patient needs to be evaluated today and informed her of mine and the patients conversation as mentioned above. I informed [name] that the patient did not sound like she was going to be evaluated, she informed me that the patient has a hard time finding a ride and that she is almost positive that the patient will not go to be evaluated. Gave linda verbal orders for UA and culture. Stressed the importance of having the patient evaluated. [name] verbalized understanding.

## 2017-03-23 NOTE — Telephone Encounter (Signed)
CMA spoke with patient and home health nurse. See her note.

## 2017-03-23 NOTE — Telephone Encounter (Signed)
Noted. Agree.

## 2017-03-24 DIAGNOSIS — L89512 Pressure ulcer of right ankle, stage 2: Secondary | ICD-10-CM | POA: Diagnosis not present

## 2017-03-24 DIAGNOSIS — J449 Chronic obstructive pulmonary disease, unspecified: Secondary | ICD-10-CM | POA: Diagnosis not present

## 2017-03-24 DIAGNOSIS — I110 Hypertensive heart disease with heart failure: Secondary | ICD-10-CM | POA: Diagnosis not present

## 2017-03-24 DIAGNOSIS — K219 Gastro-esophageal reflux disease without esophagitis: Secondary | ICD-10-CM | POA: Diagnosis not present

## 2017-03-24 DIAGNOSIS — I509 Heart failure, unspecified: Secondary | ICD-10-CM | POA: Diagnosis not present

## 2017-03-24 DIAGNOSIS — I739 Peripheral vascular disease, unspecified: Secondary | ICD-10-CM | POA: Diagnosis not present

## 2017-03-27 ENCOUNTER — Telehealth: Payer: Self-pay | Admitting: Family Medicine

## 2017-03-27 NOTE — Telephone Encounter (Deleted)
Copied from [place] #[id number]. Topic: Quick Communication - See Telephone Encounter >> Mar 27, 2017  3:49 PM Aurelio Brash B wrote: CRM for notification. See Telephone encounter for:   03/27/17. PT asking for results of U/A  from Nov 1

## 2017-03-27 NOTE — Telephone Encounter (Signed)
Results given to DR.Caryl Bis

## 2017-03-27 NOTE — Telephone Encounter (Signed)
Copied from [place] #[id number]. Topic: Quick Communication - See Telephone Encounter >> Mar 27, 2017  3:49 PM Aurelio Brash B wrote: CRM for notification. See Telephone encounter for:   03/27/17. PT asking for results of U/A  from Nov 1

## 2017-03-27 NOTE — Telephone Encounter (Deleted)
Copied from [place] #[id number]. Topic: Quick Communication - See Telephone Encounter >> Mar 27, 2017  3:49 PM Aurelio Brash B wrote: CRM for notification. See Telephone encounter for:  03/27/17.

## 2017-03-27 NOTE — Telephone Encounter (Signed)
Urine negative for infection.  Results will be scanned into system.

## 2017-03-28 DIAGNOSIS — I110 Hypertensive heart disease with heart failure: Secondary | ICD-10-CM | POA: Diagnosis not present

## 2017-03-28 DIAGNOSIS — L89512 Pressure ulcer of right ankle, stage 2: Secondary | ICD-10-CM | POA: Diagnosis not present

## 2017-03-28 DIAGNOSIS — I509 Heart failure, unspecified: Secondary | ICD-10-CM | POA: Diagnosis not present

## 2017-03-28 DIAGNOSIS — I739 Peripheral vascular disease, unspecified: Secondary | ICD-10-CM | POA: Diagnosis not present

## 2017-03-28 DIAGNOSIS — J449 Chronic obstructive pulmonary disease, unspecified: Secondary | ICD-10-CM | POA: Diagnosis not present

## 2017-03-28 DIAGNOSIS — K219 Gastro-esophageal reflux disease without esophagitis: Secondary | ICD-10-CM | POA: Diagnosis not present

## 2017-03-28 NOTE — Telephone Encounter (Signed)
Patient aware.

## 2017-04-01 DIAGNOSIS — K219 Gastro-esophageal reflux disease without esophagitis: Secondary | ICD-10-CM | POA: Diagnosis not present

## 2017-04-01 DIAGNOSIS — I110 Hypertensive heart disease with heart failure: Secondary | ICD-10-CM | POA: Diagnosis not present

## 2017-04-01 DIAGNOSIS — I509 Heart failure, unspecified: Secondary | ICD-10-CM | POA: Diagnosis not present

## 2017-04-01 DIAGNOSIS — I739 Peripheral vascular disease, unspecified: Secondary | ICD-10-CM | POA: Diagnosis not present

## 2017-04-01 DIAGNOSIS — J449 Chronic obstructive pulmonary disease, unspecified: Secondary | ICD-10-CM | POA: Diagnosis not present

## 2017-04-01 DIAGNOSIS — L89512 Pressure ulcer of right ankle, stage 2: Secondary | ICD-10-CM | POA: Diagnosis not present

## 2017-04-03 DIAGNOSIS — L89512 Pressure ulcer of right ankle, stage 2: Secondary | ICD-10-CM | POA: Diagnosis not present

## 2017-04-03 DIAGNOSIS — I739 Peripheral vascular disease, unspecified: Secondary | ICD-10-CM | POA: Diagnosis not present

## 2017-04-03 DIAGNOSIS — J449 Chronic obstructive pulmonary disease, unspecified: Secondary | ICD-10-CM | POA: Diagnosis not present

## 2017-04-03 DIAGNOSIS — I110 Hypertensive heart disease with heart failure: Secondary | ICD-10-CM | POA: Diagnosis not present

## 2017-04-03 DIAGNOSIS — I509 Heart failure, unspecified: Secondary | ICD-10-CM | POA: Diagnosis not present

## 2017-04-03 DIAGNOSIS — K219 Gastro-esophageal reflux disease without esophagitis: Secondary | ICD-10-CM | POA: Diagnosis not present

## 2017-04-05 DIAGNOSIS — I739 Peripheral vascular disease, unspecified: Secondary | ICD-10-CM | POA: Diagnosis not present

## 2017-04-05 DIAGNOSIS — K219 Gastro-esophageal reflux disease without esophagitis: Secondary | ICD-10-CM | POA: Diagnosis not present

## 2017-04-05 DIAGNOSIS — L89512 Pressure ulcer of right ankle, stage 2: Secondary | ICD-10-CM | POA: Diagnosis not present

## 2017-04-05 DIAGNOSIS — I509 Heart failure, unspecified: Secondary | ICD-10-CM | POA: Diagnosis not present

## 2017-04-05 DIAGNOSIS — I110 Hypertensive heart disease with heart failure: Secondary | ICD-10-CM | POA: Diagnosis not present

## 2017-04-05 DIAGNOSIS — J449 Chronic obstructive pulmonary disease, unspecified: Secondary | ICD-10-CM | POA: Diagnosis not present

## 2017-04-10 DIAGNOSIS — I739 Peripheral vascular disease, unspecified: Secondary | ICD-10-CM | POA: Diagnosis not present

## 2017-04-10 DIAGNOSIS — J449 Chronic obstructive pulmonary disease, unspecified: Secondary | ICD-10-CM | POA: Diagnosis not present

## 2017-04-10 DIAGNOSIS — L89512 Pressure ulcer of right ankle, stage 2: Secondary | ICD-10-CM | POA: Diagnosis not present

## 2017-04-10 DIAGNOSIS — I110 Hypertensive heart disease with heart failure: Secondary | ICD-10-CM | POA: Diagnosis not present

## 2017-04-10 DIAGNOSIS — I509 Heart failure, unspecified: Secondary | ICD-10-CM | POA: Diagnosis not present

## 2017-04-10 DIAGNOSIS — K219 Gastro-esophageal reflux disease without esophagitis: Secondary | ICD-10-CM | POA: Diagnosis not present

## 2017-04-15 ENCOUNTER — Telehealth: Payer: Self-pay | Admitting: Medical

## 2017-04-15 ENCOUNTER — Encounter: Payer: Self-pay | Admitting: Family Medicine

## 2017-04-15 DIAGNOSIS — I509 Heart failure, unspecified: Secondary | ICD-10-CM | POA: Diagnosis not present

## 2017-04-15 DIAGNOSIS — Z87440 Personal history of urinary (tract) infections: Secondary | ICD-10-CM | POA: Diagnosis not present

## 2017-04-15 DIAGNOSIS — I110 Hypertensive heart disease with heart failure: Secondary | ICD-10-CM | POA: Diagnosis not present

## 2017-04-15 DIAGNOSIS — I739 Peripheral vascular disease, unspecified: Secondary | ICD-10-CM | POA: Diagnosis not present

## 2017-04-15 DIAGNOSIS — J449 Chronic obstructive pulmonary disease, unspecified: Secondary | ICD-10-CM | POA: Diagnosis not present

## 2017-04-15 DIAGNOSIS — L89512 Pressure ulcer of right ankle, stage 2: Secondary | ICD-10-CM | POA: Diagnosis not present

## 2017-04-15 DIAGNOSIS — N390 Urinary tract infection, site not specified: Secondary | ICD-10-CM | POA: Diagnosis not present

## 2017-04-15 DIAGNOSIS — K219 Gastro-esophageal reflux disease without esophagitis: Secondary | ICD-10-CM | POA: Diagnosis not present

## 2017-04-15 MED ORDER — CIPROFLOXACIN HCL 250 MG PO TABS: 250.0000 mg | ORAL_TABLET | Freq: Two times a day (BID) | ORAL | 0 refills | Status: DC

## 2017-04-15 NOTE — Telephone Encounter (Signed)
I got a call from home health earlier in the day.  Backup/on call for Dr. Nani Ravens.  They were trying to contact him but for whatever reason could not reach him.  I did talk with home health and patient had from frequent and painful urination recently.  History of UTI per home health.  In the past she is tolerating Cipro without any problems.  They wanted to know if they could collect urine today and get C&S.  I gave verbal order and that also called in Cipro 250 mg twice daily for 3 days pending the culture result.  I called that prescription to pharmacy med cap.   I have asked home health to go out either Monday afternoon or Tuesday morning to see how the patient is doing.  This was so if culture delayed we could make a clinical decision if she seems to be worsening by then.

## 2017-04-18 ENCOUNTER — Telehealth: Payer: Self-pay

## 2017-04-18 DIAGNOSIS — I509 Heart failure, unspecified: Secondary | ICD-10-CM | POA: Diagnosis not present

## 2017-04-18 DIAGNOSIS — I110 Hypertensive heart disease with heart failure: Secondary | ICD-10-CM | POA: Diagnosis not present

## 2017-04-18 DIAGNOSIS — J449 Chronic obstructive pulmonary disease, unspecified: Secondary | ICD-10-CM | POA: Diagnosis not present

## 2017-04-18 DIAGNOSIS — L89512 Pressure ulcer of right ankle, stage 2: Secondary | ICD-10-CM | POA: Diagnosis not present

## 2017-04-18 DIAGNOSIS — K219 Gastro-esophageal reflux disease without esophagitis: Secondary | ICD-10-CM | POA: Diagnosis not present

## 2017-04-18 DIAGNOSIS — I739 Peripheral vascular disease, unspecified: Secondary | ICD-10-CM | POA: Diagnosis not present

## 2017-04-18 NOTE — Telephone Encounter (Signed)
Please see if she has continued to have symptoms. Please get the UA results. It appears she should be done with the cipro.

## 2017-04-18 NOTE — Telephone Encounter (Signed)
Copied from [place] [phone]. Topic: General - Other >> Apr 18, 2017  2:50 PM Marin Olp L wrote: Reason for CRM: Saturday patient had burning pain and pressure when trying to urinate. Called physician on call and got an order for urinalysis and a CNS . 3 days of Cipro, last one taken last night. Waiting for results of specimen. UA is back but [facility] did notrun CNS, which wont be ready until Thursday. Continue Cipro or wait for the final result for CNS?

## 2017-04-19 NOTE — Telephone Encounter (Signed)
Cindy Graham is going to have them refax in the morning. I gave her the number to the fax machine back here with [place]. Also, Cindy Graham is needing another verbal order saying that you are going to continue signing patients home health order. Ok to give?

## 2017-04-19 NOTE — Telephone Encounter (Signed)
Noted.  Please check to see if we receive the urinalysis results.  Thanks.

## 2017-04-19 NOTE — Telephone Encounter (Signed)
Patients home care nurse [name] says that symptoms have improved but are not gone completely. UA results were faxed over to our office yesterday. Will check with Janett Billow to see if she received them. Last dose of Cipro was taken on Monday night. CNS will be back Thursday.

## 2017-04-19 NOTE — Telephone Encounter (Signed)
Can you check on this?

## 2017-04-19 NOTE — Telephone Encounter (Signed)
I have not received this

## 2017-04-20 DIAGNOSIS — K219 Gastro-esophageal reflux disease without esophagitis: Secondary | ICD-10-CM | POA: Diagnosis not present

## 2017-04-20 DIAGNOSIS — L89512 Pressure ulcer of right ankle, stage 2: Secondary | ICD-10-CM | POA: Diagnosis not present

## 2017-04-20 DIAGNOSIS — J449 Chronic obstructive pulmonary disease, unspecified: Secondary | ICD-10-CM | POA: Diagnosis not present

## 2017-04-20 DIAGNOSIS — I739 Peripheral vascular disease, unspecified: Secondary | ICD-10-CM | POA: Diagnosis not present

## 2017-04-20 DIAGNOSIS — I509 Heart failure, unspecified: Secondary | ICD-10-CM | POA: Diagnosis not present

## 2017-04-20 DIAGNOSIS — I110 Hypertensive heart disease with heart failure: Secondary | ICD-10-CM | POA: Diagnosis not present

## 2017-04-20 NOTE — Telephone Encounter (Signed)
We still have not received fax. Will call again tomorrow.

## 2017-04-20 NOTE — Telephone Encounter (Signed)
Verbal order can be given.  Can you check to ensure that this was faxed to [place]?  Thanks.

## 2017-04-21 ENCOUNTER — Telehealth: Payer: Self-pay

## 2017-04-21 MED ORDER — CIPROFLOXACIN HCL 250 MG PO TABS: 250.0000 mg | ORAL_TABLET | Freq: Two times a day (BID) | ORAL | 0 refills | Status: DC

## 2017-04-21 NOTE — Telephone Encounter (Signed)
Still have not received fax, calling [facility] again to get them to refax    Copied from [place]. Topic: General - Other >> Apr 20, 2017  1:15 PM Marin Olp L wrote: Reason for CRM: Letting Larena Glassman know Sent preliminary lab results to office today. Culture is not back yet.

## 2017-04-21 NOTE — Telephone Encounter (Signed)
Spoke with [facility]. They have just received the script via fax.

## 2017-04-21 NOTE — Telephone Encounter (Addendum)
Noted. We will retreat with cipro given her allergies. They should collect another UA and urine culture. It also appears she established care at [facility] for primary care. Please check to see if this is the case and if so she needs to follow-up with them regarding this next week and her home health needs to be changed to her new PCP. She will need to be seen in the office here for follow-up if she is continuing to see [place]. Thanks.

## 2017-04-21 NOTE — Telephone Encounter (Signed)
Repeat as soon as possible. Thanks.

## 2017-04-21 NOTE — Telephone Encounter (Signed)
Patient is going to continue to be seen by you. She has an appointment with you on 05/05/2017. If you would like her to be seen sooner I can work her in whenever you would like. I have given verbals to [facility] for repeat UA and Culture, she just wants to know how soon you would like it to be done.

## 2017-04-21 NOTE — Telephone Encounter (Signed)
Copied from [place] [id number]. Topic: Quick Communication - See Telephone Encounter >> Apr 21, 2017  3:23 PM Synthia Innocent wrote: CRM for notification. See Telephone encounter for:  Pharmacy did not receive refill on ciprofloxacin (CIPRO) 250 MG tablet  04/21/17.

## 2017-04-21 NOTE — Telephone Encounter (Signed)
[name] from [facility] states that patient is still having symptoms. She is still complaining of pressure and saying that she just doesn't feel well. She also stated the patient says the 3 days of antibiotics did help but symptoms did not resolve completely.

## 2017-04-21 NOTE — Addendum Note (Signed)
Addended by: Caryl Bis, Lilyona Richner G on: 04/21/2017 12:08 PM   Modules accepted: Orders

## 2017-04-21 NOTE — Telephone Encounter (Signed)
Left detailed message for [name] Click to let her know when to repeat UA

## 2017-04-21 NOTE — Telephone Encounter (Signed)
Urine culture results received. Appears to have a UTI and is sensitive to cipro which she was treated with. Please see if her symptoms have improved. Thanks.

## 2017-04-24 DIAGNOSIS — Z9981 Dependence on supplemental oxygen: Secondary | ICD-10-CM | POA: Diagnosis not present

## 2017-04-24 DIAGNOSIS — I509 Heart failure, unspecified: Secondary | ICD-10-CM | POA: Diagnosis not present

## 2017-04-24 DIAGNOSIS — I739 Peripheral vascular disease, unspecified: Secondary | ICD-10-CM | POA: Diagnosis not present

## 2017-04-24 DIAGNOSIS — L989 Disorder of the skin and subcutaneous tissue, unspecified: Secondary | ICD-10-CM | POA: Diagnosis not present

## 2017-04-24 DIAGNOSIS — Z7982 Long term (current) use of aspirin: Secondary | ICD-10-CM | POA: Diagnosis not present

## 2017-04-24 DIAGNOSIS — Z742 Need for assistance at home and no other household member able to render care: Secondary | ICD-10-CM | POA: Diagnosis not present

## 2017-04-24 DIAGNOSIS — Z87440 Personal history of urinary (tract) infections: Secondary | ICD-10-CM | POA: Diagnosis not present

## 2017-04-24 DIAGNOSIS — Z8701 Personal history of pneumonia (recurrent): Secondary | ICD-10-CM | POA: Diagnosis not present

## 2017-04-24 DIAGNOSIS — R32 Unspecified urinary incontinence: Secondary | ICD-10-CM | POA: Diagnosis not present

## 2017-04-24 DIAGNOSIS — K219 Gastro-esophageal reflux disease without esophagitis: Secondary | ICD-10-CM | POA: Diagnosis not present

## 2017-04-24 DIAGNOSIS — J449 Chronic obstructive pulmonary disease, unspecified: Secondary | ICD-10-CM | POA: Diagnosis not present

## 2017-04-24 DIAGNOSIS — I110 Hypertensive heart disease with heart failure: Secondary | ICD-10-CM | POA: Diagnosis not present

## 2017-04-26 DIAGNOSIS — L989 Disorder of the skin and subcutaneous tissue, unspecified: Secondary | ICD-10-CM | POA: Diagnosis not present

## 2017-04-26 DIAGNOSIS — J449 Chronic obstructive pulmonary disease, unspecified: Secondary | ICD-10-CM | POA: Diagnosis not present

## 2017-04-26 DIAGNOSIS — Z87440 Personal history of urinary (tract) infections: Secondary | ICD-10-CM | POA: Diagnosis not present

## 2017-04-26 DIAGNOSIS — I739 Peripheral vascular disease, unspecified: Secondary | ICD-10-CM | POA: Diagnosis not present

## 2017-04-26 DIAGNOSIS — N390 Urinary tract infection, site not specified: Secondary | ICD-10-CM | POA: Diagnosis not present

## 2017-04-26 DIAGNOSIS — I110 Hypertensive heart disease with heart failure: Secondary | ICD-10-CM | POA: Diagnosis not present

## 2017-04-26 DIAGNOSIS — I509 Heart failure, unspecified: Secondary | ICD-10-CM | POA: Diagnosis not present

## 2017-05-01 NOTE — Telephone Encounter (Signed)
Please let the patient know that her urine culture did not reveal any infection.  Thanks.

## 2017-05-03 DIAGNOSIS — J449 Chronic obstructive pulmonary disease, unspecified: Secondary | ICD-10-CM | POA: Diagnosis not present

## 2017-05-03 DIAGNOSIS — Z87440 Personal history of urinary (tract) infections: Secondary | ICD-10-CM | POA: Diagnosis not present

## 2017-05-03 DIAGNOSIS — I739 Peripheral vascular disease, unspecified: Secondary | ICD-10-CM | POA: Diagnosis not present

## 2017-05-03 DIAGNOSIS — L989 Disorder of the skin and subcutaneous tissue, unspecified: Secondary | ICD-10-CM | POA: Diagnosis not present

## 2017-05-03 DIAGNOSIS — I110 Hypertensive heart disease with heart failure: Secondary | ICD-10-CM | POA: Diagnosis not present

## 2017-05-03 DIAGNOSIS — I509 Heart failure, unspecified: Secondary | ICD-10-CM | POA: Diagnosis not present

## 2017-05-03 NOTE — Telephone Encounter (Signed)
Patient aware.

## 2017-05-04 ENCOUNTER — Ambulatory Visit: Payer: Medicare Other | Admitting: Family Medicine

## 2017-05-05 ENCOUNTER — Other Ambulatory Visit: Payer: Self-pay

## 2017-05-05 ENCOUNTER — Encounter: Payer: Self-pay | Admitting: Family Medicine

## 2017-05-05 ENCOUNTER — Ambulatory Visit (INDEPENDENT_AMBULATORY_CARE_PROVIDER_SITE_OTHER): Payer: Medicare Other | Admitting: Family Medicine

## 2017-05-05 DIAGNOSIS — F329 Major depressive disorder, single episode, unspecified: Secondary | ICD-10-CM | POA: Diagnosis not present

## 2017-05-05 DIAGNOSIS — N3000 Acute cystitis without hematuria: Secondary | ICD-10-CM

## 2017-05-05 DIAGNOSIS — L89511 Pressure ulcer of right ankle, stage 1: Secondary | ICD-10-CM

## 2017-05-05 DIAGNOSIS — I10 Essential (primary) hypertension: Secondary | ICD-10-CM

## 2017-05-05 DIAGNOSIS — F32A Depression, unspecified: Secondary | ICD-10-CM

## 2017-05-05 DIAGNOSIS — R053 Chronic cough: Secondary | ICD-10-CM

## 2017-05-05 DIAGNOSIS — R05 Cough: Secondary | ICD-10-CM

## 2017-05-05 NOTE — Assessment & Plan Note (Signed)
Reports this is related to asthma.  No symptoms since going on Dulera.  She will monitor.

## 2017-05-05 NOTE — Progress Notes (Signed)
  Tommi Rumps, MD Phone: [phone]  Cindy Graham is a 83 y.o. female who presents today for follow-up.  Hypertension: Typically runs between 120-156/57-76.  Taking verapamil, Dyazide, propranolol, and enalapril.  No chest pain, shortness of breath, or edema.  Patient previously had a pressure ulcer on her right lateral malleolus.  This has improved significantly.  No signs of infection.  Depression: Notes this is stable.  Occasionally has symptoms.  No SI.  Taking Zoloft.  Asthma: Has been on [facility] for some time now notes this controls her symptoms very well.  No shortness of breath, cough, or wheezing.  Has not had to use any albuterol.  She was previously treated for UTI and notes her symptoms have resolved.  Social History   Tobacco Use  Smoking Status Never Smoker  Smokeless Tobacco Never Used     ROS see history of present illness  Objective  Physical Exam Vitals:   05/05/17 1412  BP: 140/60  Pulse: 75  Temp: 97.8 F (36.6 C)  SpO2: 96%    BP Readings from Last 3 Encounters:  05/05/17 140/60  02/17/17 (!) 148/60  01/25/17 128/62   Wt Readings from Last 3 Encounters:  05/05/17 182 lb 3.2 oz (82.6 kg)  02/17/17 184 lb (83.5 kg)  01/25/17 184 lb 1.9 oz (83.5 kg)    Physical Exam  Constitutional: No distress.  Cardiovascular: Normal rate, regular rhythm and normal heart sounds.  Pulmonary/Chest: Effort normal and breath sounds normal.  Neurological: She is alert. Gait normal.  Skin: Skin is warm and dry. She is not diaphoretic.  No evidence of right ankle pressure ulcer     Assessment/Plan: Please see individual problem list.  Chronic cough Reports this is related to asthma.  No symptoms since going on Dulera.  She will monitor.  Pressure ulcer of ankle Well-healed.  Monitor for recurrence.  Hypertension Well-controlled for age.  Continue current regimen.  Depression/Anxiety Well-controlled.  Continue Zoloft.  UTI (urinary tract  infection) Recently treated.  Symptoms have resolved.  Monitor for recurrence.   Cindy Graham was seen today for follow-up.  Diagnoses and all orders for this visit:  Chronic cough  Pressure injury of right ankle, stage 1  Essential hypertension  Depression, unspecified depression type  Acute cystitis without hematuria    No orders of the defined types were placed in this encounter.   No orders of the defined types were placed in this encounter.    Tommi Rumps, MD [place]

## 2017-05-05 NOTE — Patient Instructions (Signed)
Nice to see you. Please continue your current blood pressure regimen. Please monitor the area on your right ankle and if it worsens again please let us know.

## 2017-05-05 NOTE — Assessment & Plan Note (Signed)
Well-healed.  Monitor for recurrence.

## 2017-05-05 NOTE — Assessment & Plan Note (Signed)
Well-controlled for age.  Continue current regimen. 

## 2017-05-05 NOTE — Assessment & Plan Note (Signed)
Well-controlled.  Continue Zoloft. 

## 2017-05-05 NOTE — Assessment & Plan Note (Signed)
Recently treated.  Symptoms have resolved.  Monitor for recurrence.

## 2017-05-09 DIAGNOSIS — I110 Hypertensive heart disease with heart failure: Secondary | ICD-10-CM | POA: Diagnosis not present

## 2017-05-09 DIAGNOSIS — J449 Chronic obstructive pulmonary disease, unspecified: Secondary | ICD-10-CM | POA: Diagnosis not present

## 2017-05-09 DIAGNOSIS — I739 Peripheral vascular disease, unspecified: Secondary | ICD-10-CM | POA: Diagnosis not present

## 2017-05-09 DIAGNOSIS — L989 Disorder of the skin and subcutaneous tissue, unspecified: Secondary | ICD-10-CM | POA: Diagnosis not present

## 2017-05-09 DIAGNOSIS — I509 Heart failure, unspecified: Secondary | ICD-10-CM | POA: Diagnosis not present

## 2017-05-09 DIAGNOSIS — Z87440 Personal history of urinary (tract) infections: Secondary | ICD-10-CM | POA: Diagnosis not present

## 2017-05-18 DIAGNOSIS — I509 Heart failure, unspecified: Secondary | ICD-10-CM | POA: Diagnosis not present

## 2017-05-18 DIAGNOSIS — L989 Disorder of the skin and subcutaneous tissue, unspecified: Secondary | ICD-10-CM | POA: Diagnosis not present

## 2017-05-18 DIAGNOSIS — J449 Chronic obstructive pulmonary disease, unspecified: Secondary | ICD-10-CM | POA: Diagnosis not present

## 2017-05-18 DIAGNOSIS — Z87440 Personal history of urinary (tract) infections: Secondary | ICD-10-CM | POA: Diagnosis not present

## 2017-05-18 DIAGNOSIS — I110 Hypertensive heart disease with heart failure: Secondary | ICD-10-CM | POA: Diagnosis not present

## 2017-05-18 DIAGNOSIS — I739 Peripheral vascular disease, unspecified: Secondary | ICD-10-CM | POA: Diagnosis not present

## 2017-05-24 ENCOUNTER — Other Ambulatory Visit: Payer: Self-pay | Admitting: Family Medicine

## 2017-05-25 DIAGNOSIS — I739 Peripheral vascular disease, unspecified: Secondary | ICD-10-CM | POA: Diagnosis not present

## 2017-05-25 DIAGNOSIS — I509 Heart failure, unspecified: Secondary | ICD-10-CM | POA: Diagnosis not present

## 2017-05-25 DIAGNOSIS — I110 Hypertensive heart disease with heart failure: Secondary | ICD-10-CM | POA: Diagnosis not present

## 2017-05-25 DIAGNOSIS — J449 Chronic obstructive pulmonary disease, unspecified: Secondary | ICD-10-CM | POA: Diagnosis not present

## 2017-05-25 DIAGNOSIS — Z87440 Personal history of urinary (tract) infections: Secondary | ICD-10-CM | POA: Diagnosis not present

## 2017-05-25 DIAGNOSIS — L989 Disorder of the skin and subcutaneous tissue, unspecified: Secondary | ICD-10-CM | POA: Diagnosis not present

## 2017-05-26 NOTE — Telephone Encounter (Signed)
Last office visit 05/05/17 Prescribed by Dr Lacinda Axon Next office visit 08/04/17

## 2017-05-31 DIAGNOSIS — I739 Peripheral vascular disease, unspecified: Secondary | ICD-10-CM | POA: Diagnosis not present

## 2017-05-31 DIAGNOSIS — I110 Hypertensive heart disease with heart failure: Secondary | ICD-10-CM | POA: Diagnosis not present

## 2017-05-31 DIAGNOSIS — L989 Disorder of the skin and subcutaneous tissue, unspecified: Secondary | ICD-10-CM | POA: Diagnosis not present

## 2017-05-31 DIAGNOSIS — J449 Chronic obstructive pulmonary disease, unspecified: Secondary | ICD-10-CM | POA: Diagnosis not present

## 2017-05-31 DIAGNOSIS — I509 Heart failure, unspecified: Secondary | ICD-10-CM | POA: Diagnosis not present

## 2017-05-31 DIAGNOSIS — Z87440 Personal history of urinary (tract) infections: Secondary | ICD-10-CM | POA: Diagnosis not present

## 2017-06-02 DIAGNOSIS — L97521 Non-pressure chronic ulcer of other part of left foot limited to breakdown of skin: Secondary | ICD-10-CM | POA: Diagnosis not present

## 2017-06-02 DIAGNOSIS — L02612 Cutaneous abscess of left foot: Secondary | ICD-10-CM | POA: Diagnosis not present

## 2017-06-02 DIAGNOSIS — M79672 Pain in left foot: Secondary | ICD-10-CM | POA: Diagnosis not present

## 2017-06-02 DIAGNOSIS — B351 Tinea unguium: Secondary | ICD-10-CM | POA: Diagnosis not present

## 2017-06-02 DIAGNOSIS — M79671 Pain in right foot: Secondary | ICD-10-CM | POA: Diagnosis not present

## 2017-06-03 DIAGNOSIS — I739 Peripheral vascular disease, unspecified: Secondary | ICD-10-CM | POA: Diagnosis not present

## 2017-06-03 DIAGNOSIS — L989 Disorder of the skin and subcutaneous tissue, unspecified: Secondary | ICD-10-CM | POA: Diagnosis not present

## 2017-06-03 DIAGNOSIS — I509 Heart failure, unspecified: Secondary | ICD-10-CM | POA: Diagnosis not present

## 2017-06-03 DIAGNOSIS — I110 Hypertensive heart disease with heart failure: Secondary | ICD-10-CM | POA: Diagnosis not present

## 2017-06-03 DIAGNOSIS — J449 Chronic obstructive pulmonary disease, unspecified: Secondary | ICD-10-CM | POA: Diagnosis not present

## 2017-06-03 DIAGNOSIS — Z87440 Personal history of urinary (tract) infections: Secondary | ICD-10-CM | POA: Diagnosis not present

## 2017-06-06 DIAGNOSIS — I509 Heart failure, unspecified: Secondary | ICD-10-CM | POA: Diagnosis not present

## 2017-06-06 DIAGNOSIS — I739 Peripheral vascular disease, unspecified: Secondary | ICD-10-CM | POA: Diagnosis not present

## 2017-06-06 DIAGNOSIS — Z87440 Personal history of urinary (tract) infections: Secondary | ICD-10-CM | POA: Diagnosis not present

## 2017-06-06 DIAGNOSIS — L989 Disorder of the skin and subcutaneous tissue, unspecified: Secondary | ICD-10-CM | POA: Diagnosis not present

## 2017-06-06 DIAGNOSIS — J449 Chronic obstructive pulmonary disease, unspecified: Secondary | ICD-10-CM | POA: Diagnosis not present

## 2017-06-06 DIAGNOSIS — I110 Hypertensive heart disease with heart failure: Secondary | ICD-10-CM | POA: Diagnosis not present

## 2017-06-08 DIAGNOSIS — L97521 Non-pressure chronic ulcer of other part of left foot limited to breakdown of skin: Secondary | ICD-10-CM | POA: Diagnosis not present

## 2017-06-12 ENCOUNTER — Telehealth: Payer: Self-pay | Admitting: Family Medicine

## 2017-06-12 DIAGNOSIS — Q141 Congenital malformation of retina: Secondary | ICD-10-CM | POA: Diagnosis not present

## 2017-06-12 NOTE — Telephone Encounter (Signed)
Verbal orders can be given. 

## 2017-06-12 NOTE — Telephone Encounter (Signed)
Please advise 

## 2017-06-12 NOTE — Telephone Encounter (Signed)
Copied from [place]. Topic: Quick Communication - See Telephone Encounter >> Jun 12, 2017  3:17 PM Cleaster Corin, [place] wrote: CRM for notification. See Telephone encounter for:   06/12/17. linda click calling [facility] to get verbal orders for home health nursing for pt.new wound left foot third toe. Being treated by Dr. Vickki Muff [facility]) [phone]

## 2017-06-13 DIAGNOSIS — I509 Heart failure, unspecified: Secondary | ICD-10-CM | POA: Diagnosis not present

## 2017-06-13 DIAGNOSIS — J449 Chronic obstructive pulmonary disease, unspecified: Secondary | ICD-10-CM | POA: Diagnosis not present

## 2017-06-13 DIAGNOSIS — I110 Hypertensive heart disease with heart failure: Secondary | ICD-10-CM | POA: Diagnosis not present

## 2017-06-13 DIAGNOSIS — I739 Peripheral vascular disease, unspecified: Secondary | ICD-10-CM | POA: Diagnosis not present

## 2017-06-13 DIAGNOSIS — L989 Disorder of the skin and subcutaneous tissue, unspecified: Secondary | ICD-10-CM | POA: Diagnosis not present

## 2017-06-13 DIAGNOSIS — Z87440 Personal history of urinary (tract) infections: Secondary | ICD-10-CM | POA: Diagnosis not present

## 2017-06-13 NOTE — Telephone Encounter (Signed)
Verbal order given  

## 2017-06-16 DIAGNOSIS — M5126 Other intervertebral disc displacement, lumbar region: Secondary | ICD-10-CM | POA: Diagnosis not present

## 2017-06-16 DIAGNOSIS — M7062 Trochanteric bursitis, left hip: Secondary | ICD-10-CM | POA: Diagnosis not present

## 2017-06-16 DIAGNOSIS — M1711 Unilateral primary osteoarthritis, right knee: Secondary | ICD-10-CM | POA: Diagnosis not present

## 2017-06-16 DIAGNOSIS — M5136 Other intervertebral disc degeneration, lumbar region: Secondary | ICD-10-CM | POA: Diagnosis not present

## 2017-06-16 DIAGNOSIS — M5416 Radiculopathy, lumbar region: Secondary | ICD-10-CM | POA: Diagnosis not present

## 2017-06-20 DIAGNOSIS — I110 Hypertensive heart disease with heart failure: Secondary | ICD-10-CM | POA: Diagnosis not present

## 2017-06-20 DIAGNOSIS — I509 Heart failure, unspecified: Secondary | ICD-10-CM | POA: Diagnosis not present

## 2017-06-20 DIAGNOSIS — I739 Peripheral vascular disease, unspecified: Secondary | ICD-10-CM | POA: Diagnosis not present

## 2017-06-20 DIAGNOSIS — Z87440 Personal history of urinary (tract) infections: Secondary | ICD-10-CM | POA: Diagnosis not present

## 2017-06-20 DIAGNOSIS — J449 Chronic obstructive pulmonary disease, unspecified: Secondary | ICD-10-CM | POA: Diagnosis not present

## 2017-06-20 DIAGNOSIS — L989 Disorder of the skin and subcutaneous tissue, unspecified: Secondary | ICD-10-CM | POA: Diagnosis not present

## 2017-06-22 DIAGNOSIS — J449 Chronic obstructive pulmonary disease, unspecified: Secondary | ICD-10-CM | POA: Diagnosis not present

## 2017-06-22 DIAGNOSIS — L97521 Non-pressure chronic ulcer of other part of left foot limited to breakdown of skin: Secondary | ICD-10-CM | POA: Diagnosis not present

## 2017-06-22 DIAGNOSIS — I509 Heart failure, unspecified: Secondary | ICD-10-CM | POA: Diagnosis not present

## 2017-06-22 DIAGNOSIS — M2042 Other hammer toe(s) (acquired), left foot: Secondary | ICD-10-CM | POA: Diagnosis not present

## 2017-06-22 DIAGNOSIS — Z87440 Personal history of urinary (tract) infections: Secondary | ICD-10-CM | POA: Diagnosis not present

## 2017-06-22 DIAGNOSIS — L989 Disorder of the skin and subcutaneous tissue, unspecified: Secondary | ICD-10-CM | POA: Diagnosis not present

## 2017-06-22 DIAGNOSIS — I110 Hypertensive heart disease with heart failure: Secondary | ICD-10-CM | POA: Diagnosis not present

## 2017-06-22 DIAGNOSIS — I739 Peripheral vascular disease, unspecified: Secondary | ICD-10-CM | POA: Diagnosis not present

## 2017-06-23 DIAGNOSIS — Z8701 Personal history of pneumonia (recurrent): Secondary | ICD-10-CM | POA: Diagnosis not present

## 2017-06-23 DIAGNOSIS — L97521 Non-pressure chronic ulcer of other part of left foot limited to breakdown of skin: Secondary | ICD-10-CM | POA: Diagnosis not present

## 2017-06-23 DIAGNOSIS — Z7982 Long term (current) use of aspirin: Secondary | ICD-10-CM | POA: Diagnosis not present

## 2017-06-23 DIAGNOSIS — I739 Peripheral vascular disease, unspecified: Secondary | ICD-10-CM | POA: Diagnosis not present

## 2017-06-23 DIAGNOSIS — I509 Heart failure, unspecified: Secondary | ICD-10-CM | POA: Diagnosis not present

## 2017-06-23 DIAGNOSIS — I110 Hypertensive heart disease with heart failure: Secondary | ICD-10-CM | POA: Diagnosis not present

## 2017-06-23 DIAGNOSIS — K219 Gastro-esophageal reflux disease without esophagitis: Secondary | ICD-10-CM | POA: Diagnosis not present

## 2017-06-23 DIAGNOSIS — R32 Unspecified urinary incontinence: Secondary | ICD-10-CM | POA: Diagnosis not present

## 2017-06-23 DIAGNOSIS — Z9981 Dependence on supplemental oxygen: Secondary | ICD-10-CM | POA: Diagnosis not present

## 2017-06-23 DIAGNOSIS — Z87440 Personal history of urinary (tract) infections: Secondary | ICD-10-CM | POA: Diagnosis not present

## 2017-06-23 DIAGNOSIS — Z742 Need for assistance at home and no other household member able to render care: Secondary | ICD-10-CM | POA: Diagnosis not present

## 2017-06-23 DIAGNOSIS — J449 Chronic obstructive pulmonary disease, unspecified: Secondary | ICD-10-CM | POA: Diagnosis not present

## 2017-06-26 NOTE — Telephone Encounter (Signed)
Copied from [place]. Topic: General - Other >> Jun 26, 2017  9:43 AM Neva Seat wrote: Pt returned missed call.  Please call pt back if needed.

## 2017-06-27 DIAGNOSIS — J449 Chronic obstructive pulmonary disease, unspecified: Secondary | ICD-10-CM | POA: Diagnosis not present

## 2017-06-27 DIAGNOSIS — I739 Peripheral vascular disease, unspecified: Secondary | ICD-10-CM | POA: Diagnosis not present

## 2017-06-27 DIAGNOSIS — L97521 Non-pressure chronic ulcer of other part of left foot limited to breakdown of skin: Secondary | ICD-10-CM | POA: Diagnosis not present

## 2017-06-27 DIAGNOSIS — I110 Hypertensive heart disease with heart failure: Secondary | ICD-10-CM | POA: Diagnosis not present

## 2017-06-27 DIAGNOSIS — I509 Heart failure, unspecified: Secondary | ICD-10-CM | POA: Diagnosis not present

## 2017-06-27 DIAGNOSIS — K219 Gastro-esophageal reflux disease without esophagitis: Secondary | ICD-10-CM | POA: Diagnosis not present

## 2017-07-05 DIAGNOSIS — I739 Peripheral vascular disease, unspecified: Secondary | ICD-10-CM | POA: Diagnosis not present

## 2017-07-05 DIAGNOSIS — J449 Chronic obstructive pulmonary disease, unspecified: Secondary | ICD-10-CM | POA: Diagnosis not present

## 2017-07-05 DIAGNOSIS — K219 Gastro-esophageal reflux disease without esophagitis: Secondary | ICD-10-CM | POA: Diagnosis not present

## 2017-07-05 DIAGNOSIS — I110 Hypertensive heart disease with heart failure: Secondary | ICD-10-CM | POA: Diagnosis not present

## 2017-07-05 DIAGNOSIS — I509 Heart failure, unspecified: Secondary | ICD-10-CM | POA: Diagnosis not present

## 2017-07-05 DIAGNOSIS — L97521 Non-pressure chronic ulcer of other part of left foot limited to breakdown of skin: Secondary | ICD-10-CM | POA: Diagnosis not present

## 2017-07-06 DIAGNOSIS — L97521 Non-pressure chronic ulcer of other part of left foot limited to breakdown of skin: Secondary | ICD-10-CM | POA: Diagnosis not present

## 2017-07-10 ENCOUNTER — Other Ambulatory Visit: Payer: Self-pay

## 2017-07-10 MED ORDER — VERAPAMIL HCL ER 180 MG PO TBCR: 180.0000 mg | EXTENDED_RELEASE_TABLET | Freq: Every day | ORAL | 3 refills | Status: DC

## 2017-07-10 MED ORDER — NYSTATIN 100000 UNIT/GM EX CREA: 1.0000 "application " | TOPICAL_CREAM | Freq: Two times a day (BID) | CUTANEOUS | 6 refills | Status: DC

## 2017-07-10 NOTE — Telephone Encounter (Signed)
Last OV 05/05/17 last filled by Dr.Cook  Verapamil 12/19/16 90 1rf Nystatin 05/05/16 30 g 6rf

## 2017-07-10 NOTE — Telephone Encounter (Signed)
Refill sent to pharmacy.  Please see what she takes the nystatin for.

## 2017-07-11 NOTE — Telephone Encounter (Signed)
Patient states she takes it for a yeast rash from taking antibiotics.It has been helping and the rash is clearing up.

## 2017-07-14 DIAGNOSIS — I110 Hypertensive heart disease with heart failure: Secondary | ICD-10-CM | POA: Diagnosis not present

## 2017-07-14 DIAGNOSIS — L97521 Non-pressure chronic ulcer of other part of left foot limited to breakdown of skin: Secondary | ICD-10-CM | POA: Diagnosis not present

## 2017-07-14 DIAGNOSIS — I509 Heart failure, unspecified: Secondary | ICD-10-CM | POA: Diagnosis not present

## 2017-07-14 DIAGNOSIS — I739 Peripheral vascular disease, unspecified: Secondary | ICD-10-CM | POA: Diagnosis not present

## 2017-07-14 DIAGNOSIS — K219 Gastro-esophageal reflux disease without esophagitis: Secondary | ICD-10-CM | POA: Diagnosis not present

## 2017-07-14 DIAGNOSIS — J449 Chronic obstructive pulmonary disease, unspecified: Secondary | ICD-10-CM | POA: Diagnosis not present

## 2017-07-19 DIAGNOSIS — J449 Chronic obstructive pulmonary disease, unspecified: Secondary | ICD-10-CM | POA: Diagnosis not present

## 2017-07-19 DIAGNOSIS — I739 Peripheral vascular disease, unspecified: Secondary | ICD-10-CM | POA: Diagnosis not present

## 2017-07-19 DIAGNOSIS — K219 Gastro-esophageal reflux disease without esophagitis: Secondary | ICD-10-CM | POA: Diagnosis not present

## 2017-07-19 DIAGNOSIS — I110 Hypertensive heart disease with heart failure: Secondary | ICD-10-CM | POA: Diagnosis not present

## 2017-07-19 DIAGNOSIS — L97521 Non-pressure chronic ulcer of other part of left foot limited to breakdown of skin: Secondary | ICD-10-CM | POA: Diagnosis not present

## 2017-07-19 DIAGNOSIS — I509 Heart failure, unspecified: Secondary | ICD-10-CM | POA: Diagnosis not present

## 2017-07-25 DIAGNOSIS — I110 Hypertensive heart disease with heart failure: Secondary | ICD-10-CM | POA: Diagnosis not present

## 2017-07-25 DIAGNOSIS — L97521 Non-pressure chronic ulcer of other part of left foot limited to breakdown of skin: Secondary | ICD-10-CM | POA: Diagnosis not present

## 2017-07-25 DIAGNOSIS — I509 Heart failure, unspecified: Secondary | ICD-10-CM | POA: Diagnosis not present

## 2017-07-25 DIAGNOSIS — J449 Chronic obstructive pulmonary disease, unspecified: Secondary | ICD-10-CM | POA: Diagnosis not present

## 2017-07-25 DIAGNOSIS — K219 Gastro-esophageal reflux disease without esophagitis: Secondary | ICD-10-CM | POA: Diagnosis not present

## 2017-07-25 DIAGNOSIS — I739 Peripheral vascular disease, unspecified: Secondary | ICD-10-CM | POA: Diagnosis not present

## 2017-08-04 ENCOUNTER — Ambulatory Visit: Payer: Medicare Other | Admitting: Family Medicine

## 2017-08-09 ENCOUNTER — Ambulatory Visit: Payer: Self-pay

## 2017-08-09 DIAGNOSIS — I110 Hypertensive heart disease with heart failure: Secondary | ICD-10-CM | POA: Diagnosis not present

## 2017-08-09 DIAGNOSIS — I739 Peripheral vascular disease, unspecified: Secondary | ICD-10-CM | POA: Diagnosis not present

## 2017-08-09 DIAGNOSIS — I509 Heart failure, unspecified: Secondary | ICD-10-CM | POA: Diagnosis not present

## 2017-08-09 DIAGNOSIS — L97521 Non-pressure chronic ulcer of other part of left foot limited to breakdown of skin: Secondary | ICD-10-CM | POA: Diagnosis not present

## 2017-08-09 DIAGNOSIS — K219 Gastro-esophageal reflux disease without esophagitis: Secondary | ICD-10-CM | POA: Diagnosis not present

## 2017-08-09 DIAGNOSIS — J449 Chronic obstructive pulmonary disease, unspecified: Secondary | ICD-10-CM | POA: Diagnosis not present

## 2017-08-09 NOTE — Telephone Encounter (Signed)
Pt. Reports her Dulera inhaler causes her mouth to be sore. She uses good mouth care after using it. Reports when she has stopped it a few days, the soreness goes away. Please advise pt.

## 2017-08-09 NOTE — Telephone Encounter (Signed)
Please find out what she is doing regarding mouth care following using the [facility].  Please see if this is a new issue or if this has been going on for some time.  Last time I saw her she had been doing well with the [facility].  We could certainly try to change this if needed though she may have the same issue with an alternative medication.

## 2017-08-09 NOTE — Telephone Encounter (Signed)
Please advise 

## 2017-08-10 NOTE — Telephone Encounter (Signed)
Verbal order given  

## 2017-08-10 NOTE — Telephone Encounter (Signed)
Noted.  You can give verbal orders to continue home health nursing.  We could certainly try switching her back to Flovent as long as it was working well for her symptoms.  I will forward this to [place] as well to see if she could help with a conversion from [facility] at the patient's current dosing to Flovent.  Thanks.

## 2017-08-10 NOTE — Telephone Encounter (Signed)
Linda from home health states she is rinsing her mouth with warm water and gurggeling, also using mouth wash. [name] states she has not reported it but the patient informed her it has been happening but the patient would just stop using her inhaler to have this cleared up.[name] states she was on Flovent before and did not have this issue, the rx was changed because at that time the insurance would not cover.   [name] would like order to continue home health nursing

## 2017-08-12 ENCOUNTER — Telehealth: Payer: Self-pay | Admitting: Family Medicine

## 2017-08-12 MED ORDER — FLUTICASONE PROPIONATE HFA 220 MCG/ACT IN AERO: 2.0000 | INHALATION_SPRAY | Freq: Two times a day (BID) | RESPIRATORY_TRACT | 12 refills | Status: DC

## 2017-08-12 NOTE — Telephone Encounter (Signed)
Please let the patient know that I heard back from our clinical pharmacist regarding transitioning from [facility] to [place].  I have sent in the appropriate dosing of Flovent.  When she picks this up she should discontinue the [facility].  If she has increased issues with her breathing or making this switch she needs to let us know.

## 2017-08-12 NOTE — Telephone Encounter (Signed)
-----   Message from Carlean Jews, [facility] sent at 08/10/2017  1:49 PM EDT ----- Regarding: Ruthe Mannan > Flovent Hey,   You can switch high steroid dose Dulera to high steroid dose flovent by giving flovent 220 mcg HFA -  2 puffs BID. For some reason, I cannot open the encounter as I 'don't have security' to do this? I'll submit Epic ticket.   Carlean Jews, Pharm.D., BCPS PGY2 Ambulatory Care Pharmacy Resident Phone: [phone]

## 2017-08-14 NOTE — Telephone Encounter (Signed)
Patient notified

## 2017-08-19 DIAGNOSIS — K219 Gastro-esophageal reflux disease without esophagitis: Secondary | ICD-10-CM | POA: Diagnosis not present

## 2017-08-19 DIAGNOSIS — I739 Peripheral vascular disease, unspecified: Secondary | ICD-10-CM | POA: Diagnosis not present

## 2017-08-19 DIAGNOSIS — L97521 Non-pressure chronic ulcer of other part of left foot limited to breakdown of skin: Secondary | ICD-10-CM | POA: Diagnosis not present

## 2017-08-19 DIAGNOSIS — I509 Heart failure, unspecified: Secondary | ICD-10-CM | POA: Diagnosis not present

## 2017-08-19 DIAGNOSIS — J449 Chronic obstructive pulmonary disease, unspecified: Secondary | ICD-10-CM | POA: Diagnosis not present

## 2017-08-19 DIAGNOSIS — I110 Hypertensive heart disease with heart failure: Secondary | ICD-10-CM | POA: Diagnosis not present

## 2017-08-22 DIAGNOSIS — K219 Gastro-esophageal reflux disease without esophagitis: Secondary | ICD-10-CM | POA: Diagnosis not present

## 2017-08-22 DIAGNOSIS — Z9981 Dependence on supplemental oxygen: Secondary | ICD-10-CM | POA: Diagnosis not present

## 2017-08-22 DIAGNOSIS — Z87440 Personal history of urinary (tract) infections: Secondary | ICD-10-CM | POA: Diagnosis not present

## 2017-08-22 DIAGNOSIS — R32 Unspecified urinary incontinence: Secondary | ICD-10-CM | POA: Diagnosis not present

## 2017-08-22 DIAGNOSIS — I509 Heart failure, unspecified: Secondary | ICD-10-CM | POA: Diagnosis not present

## 2017-08-22 DIAGNOSIS — L89611 Pressure ulcer of right heel, stage 1: Secondary | ICD-10-CM | POA: Diagnosis not present

## 2017-08-22 DIAGNOSIS — Z742 Need for assistance at home and no other household member able to render care: Secondary | ICD-10-CM | POA: Diagnosis not present

## 2017-08-22 DIAGNOSIS — J449 Chronic obstructive pulmonary disease, unspecified: Secondary | ICD-10-CM | POA: Diagnosis not present

## 2017-08-22 DIAGNOSIS — Z7982 Long term (current) use of aspirin: Secondary | ICD-10-CM | POA: Diagnosis not present

## 2017-08-22 DIAGNOSIS — I739 Peripheral vascular disease, unspecified: Secondary | ICD-10-CM | POA: Diagnosis not present

## 2017-08-22 DIAGNOSIS — Z8701 Personal history of pneumonia (recurrent): Secondary | ICD-10-CM | POA: Diagnosis not present

## 2017-08-22 DIAGNOSIS — I110 Hypertensive heart disease with heart failure: Secondary | ICD-10-CM | POA: Diagnosis not present

## 2017-08-24 DIAGNOSIS — J449 Chronic obstructive pulmonary disease, unspecified: Secondary | ICD-10-CM | POA: Diagnosis not present

## 2017-08-24 DIAGNOSIS — I509 Heart failure, unspecified: Secondary | ICD-10-CM | POA: Diagnosis not present

## 2017-08-24 DIAGNOSIS — L89611 Pressure ulcer of right heel, stage 1: Secondary | ICD-10-CM | POA: Diagnosis not present

## 2017-08-24 DIAGNOSIS — I110 Hypertensive heart disease with heart failure: Secondary | ICD-10-CM | POA: Diagnosis not present

## 2017-08-24 DIAGNOSIS — K219 Gastro-esophageal reflux disease without esophagitis: Secondary | ICD-10-CM | POA: Diagnosis not present

## 2017-08-24 DIAGNOSIS — I739 Peripheral vascular disease, unspecified: Secondary | ICD-10-CM | POA: Diagnosis not present

## 2017-08-30 DIAGNOSIS — I509 Heart failure, unspecified: Secondary | ICD-10-CM | POA: Diagnosis not present

## 2017-08-30 DIAGNOSIS — I110 Hypertensive heart disease with heart failure: Secondary | ICD-10-CM | POA: Diagnosis not present

## 2017-08-30 DIAGNOSIS — I739 Peripheral vascular disease, unspecified: Secondary | ICD-10-CM | POA: Diagnosis not present

## 2017-08-30 DIAGNOSIS — J449 Chronic obstructive pulmonary disease, unspecified: Secondary | ICD-10-CM | POA: Diagnosis not present

## 2017-08-30 DIAGNOSIS — L89611 Pressure ulcer of right heel, stage 1: Secondary | ICD-10-CM | POA: Diagnosis not present

## 2017-08-30 DIAGNOSIS — K219 Gastro-esophageal reflux disease without esophagitis: Secondary | ICD-10-CM | POA: Diagnosis not present

## 2017-09-05 ENCOUNTER — Ambulatory Visit (INDEPENDENT_AMBULATORY_CARE_PROVIDER_SITE_OTHER): Payer: Medicare Other | Admitting: Family Medicine

## 2017-09-05 ENCOUNTER — Other Ambulatory Visit: Payer: Self-pay

## 2017-09-05 VITALS — BP 138/66 | HR 69 | Temp 97.9°F | Wt 176.0 lb

## 2017-09-05 DIAGNOSIS — R7303 Prediabetes: Secondary | ICD-10-CM | POA: Diagnosis not present

## 2017-09-05 DIAGNOSIS — J4520 Mild intermittent asthma, uncomplicated: Secondary | ICD-10-CM | POA: Diagnosis not present

## 2017-09-05 DIAGNOSIS — R251 Tremor, unspecified: Secondary | ICD-10-CM

## 2017-09-05 DIAGNOSIS — E538 Deficiency of other specified B group vitamins: Secondary | ICD-10-CM | POA: Diagnosis not present

## 2017-09-05 DIAGNOSIS — K5900 Constipation, unspecified: Secondary | ICD-10-CM

## 2017-09-05 DIAGNOSIS — I10 Essential (primary) hypertension: Secondary | ICD-10-CM | POA: Diagnosis not present

## 2017-09-05 NOTE — Assessment & Plan Note (Signed)
Possibly related to nucynta.  Benign abdominal exam.  Discussed fiber supplement and if not improving she will call us.  Given return precautions.

## 2017-09-05 NOTE — Assessment & Plan Note (Signed)
Asymptomatic.  Continue Flovent.

## 2017-09-05 NOTE — Assessment & Plan Note (Signed)
Benign essential tremor.  Stable.  Continue to monitor.

## 2017-09-05 NOTE — Assessment & Plan Note (Signed)
Stable.  Continue current regimen.  Check lab work.

## 2017-09-05 NOTE — Patient Instructions (Signed)
Nice to see you. We will check lab work today and contact you with the results. Please try the benefiber to see if that helps with your patient.  If you are not having good bowel movements in the next week or so please contact us. If you develop abdominal pain or blood in your stool please be evaluated.

## 2017-09-05 NOTE — Assessment & Plan Note (Signed)
Needs recheck of her B12.

## 2017-09-05 NOTE — Progress Notes (Signed)
  Tommi Rumps, MD Phone: [phone]  Cindy Graham is a 84 y.o. female who presents today for f/u.  HYPERTENSION  Disease Monitoring  Home BP Monitoring well controlled Chest pain- no    Dyspnea- no Medications  Compliance-  Taking triamterene, HCTZ, propranolol, verapamil, enalapril. Lightheadedness-  no  Edema- no  Constipation: She has been taking MiraLAX daily.  She has been having 1 bowel movement a week.  Occasionally has to strain.  Hard balls occasionally.  They are brown.  No blood.  No abdominal pain.  She is on Nucynta.  Asthma: She is on Flovent now.  No cough or wheezing.  Does not have to use her albuterol.  She has a benign essential tremor.  That has been present for many years.  It is unchanged.  Previously evaluated by her PCP.    Social History   Tobacco Use  Smoking Status Never Smoker  Smokeless Tobacco Never Used     ROS see history of present illness  Objective  Physical Exam Vitals:   09/05/17 1528  BP: 138/66  Pulse: 69  Temp: 97.9 F (36.6 C)    BP Readings from Last 3 Encounters:  09/05/17 138/66  05/05/17 140/60  02/17/17 (!) 148/60   Wt Readings from Last 3 Encounters:  09/05/17 176 lb (79.8 kg)  05/05/17 182 lb 3.2 oz (82.6 kg)  02/17/17 184 lb (83.5 kg)    Physical Exam  Constitutional: No distress.  Cardiovascular: Normal rate, regular rhythm and normal heart sounds.  Pulmonary/Chest: Effort normal and breath sounds normal.  Abdominal: Soft. Bowel sounds are normal. She exhibits no distension. There is no tenderness.  Musculoskeletal: She exhibits no edema.  Neurological: She is alert.  Tremor noted when she is trying to do something with her hands or legs, no cogwheel rigidity in her upper extremities  Skin: Skin is warm and dry. She is not diaphoretic.     Assessment/Plan: Please see individual problem list.  Hypertension Stable.  Continue current regimen.  Check lab work.  Tremor Benign essential tremor.   Stable.  Continue to monitor.  Asthma Asymptomatic.  Continue Flovent.  B12 deficiency Needs recheck of her B12.  Constipation Possibly related to nucynta.  Benign abdominal exam.  Discussed fiber supplement and if not improving she will call us.  Given return precautions.   Orders Placed This Encounter  Procedures  . CBC  . B12  . Comp Met (CMET)  . HgB A1c    No orders of the defined types were placed in this encounter.    Tommi Rumps, MD [place]

## 2017-09-06 DIAGNOSIS — K219 Gastro-esophageal reflux disease without esophagitis: Secondary | ICD-10-CM | POA: Diagnosis not present

## 2017-09-06 DIAGNOSIS — I739 Peripheral vascular disease, unspecified: Secondary | ICD-10-CM | POA: Diagnosis not present

## 2017-09-06 DIAGNOSIS — L89611 Pressure ulcer of right heel, stage 1: Secondary | ICD-10-CM | POA: Diagnosis not present

## 2017-09-06 DIAGNOSIS — I509 Heart failure, unspecified: Secondary | ICD-10-CM | POA: Diagnosis not present

## 2017-09-06 DIAGNOSIS — I110 Hypertensive heart disease with heart failure: Secondary | ICD-10-CM | POA: Diagnosis not present

## 2017-09-06 DIAGNOSIS — J449 Chronic obstructive pulmonary disease, unspecified: Secondary | ICD-10-CM | POA: Diagnosis not present

## 2017-09-06 LAB — COMPREHENSIVE METABOLIC PANEL
ALBUMIN: 3.7 g/dL (ref 3.5–5.2)
ALT: 13 U/L (ref 0–35)
AST: 23 U/L (ref 0–37)
Alkaline Phosphatase: 63 U/L (ref 39–117)
BUN: 25 mg/dL — ABNORMAL HIGH (ref 6–23)
CALCIUM: 9.7 mg/dL (ref 8.4–10.5)
CHLORIDE: 97 meq/L (ref 96–112)
CO2: 26 meq/L (ref 19–32)
Creatinine, Ser: 1.34 mg/dL — ABNORMAL HIGH (ref 0.40–1.20)
GFR: 39.14 mL/min — AB (ref >60.00)
Glucose, Bld: 95 mg/dL (ref 70–99)
POTASSIUM: 4.5 meq/L (ref 3.5–5.1)
Sodium: 134 mEq/L — ABNORMAL LOW (ref 135–145)
Total Bilirubin: 0.4 mg/dL (ref 0.2–1.2)
Total Protein: 6.9 g/dL (ref 6.0–8.3)

## 2017-09-06 LAB — HEMOGLOBIN A1C: HEMOGLOBIN A1C: 6 % (ref 4.6–6.5)

## 2017-09-06 LAB — CBC
HCT: 39.7 % (ref 36.0–46.0)
Hemoglobin: 13.1 g/dL (ref 12.0–15.0)
MCHC: 32.8 g/dL (ref 30.0–36.0)
MCV: 95.9 fl (ref 78.0–100.0)
Platelets: 279 10*3/uL (ref 150.0–400.0)
RBC: 4.14 Mil/uL (ref 3.87–5.11)
RDW: 14.8 % (ref 11.5–15.5)
WBC: 7.9 10*3/uL (ref 4.0–10.5)

## 2017-09-06 LAB — VITAMIN B12: Vitamin B-12: >1500 pg/mL — ABNORMAL HIGH (ref 211–911)

## 2017-09-14 ENCOUNTER — Telehealth: Payer: Self-pay | Admitting: Radiology

## 2017-09-14 ENCOUNTER — Telehealth: Payer: Self-pay

## 2017-09-14 ENCOUNTER — Other Ambulatory Visit (INDEPENDENT_AMBULATORY_CARE_PROVIDER_SITE_OTHER): Payer: Medicare Other

## 2017-09-14 DIAGNOSIS — N179 Acute kidney failure, unspecified: Secondary | ICD-10-CM | POA: Diagnosis not present

## 2017-09-14 NOTE — Telephone Encounter (Signed)
We could prescribe a medication to help her have a better BM if she would like.

## 2017-09-14 NOTE — Telephone Encounter (Signed)
Please advise 

## 2017-09-14 NOTE — Telephone Encounter (Signed)
Pt put on lab schedule today, please place future orders. Thank you

## 2017-09-14 NOTE — Telephone Encounter (Signed)
Copied from [place] [phone]. Topic: Inquiry >> Sep 14, 2017  9:29 AM Scherrie Gerlach wrote: Reason for CRM: pt states she is to report about her bowel movements.  Pt states they have not improved, about the same. Pt has labs on Monday, no office visits scheduled.

## 2017-09-14 NOTE — Addendum Note (Signed)
Addended by: Caryl Bis, ERIC G on: 09/14/2017 03:00 PM   Modules accepted: Orders

## 2017-09-14 NOTE — Telephone Encounter (Signed)
Patients appointment next Monday was cancelled by pec and then rescheduled for today by pec

## 2017-09-14 NOTE — Telephone Encounter (Signed)
Ordered

## 2017-09-15 ENCOUNTER — Other Ambulatory Visit: Payer: Self-pay | Admitting: Family Medicine

## 2017-09-15 ENCOUNTER — Telehealth: Payer: Self-pay | Admitting: *Deleted

## 2017-09-15 LAB — BASIC METABOLIC PANEL
BUN: 23 mg/dL (ref 6–23)
CALCIUM: 9.5 mg/dL (ref 8.4–10.5)
CO2: 27 mEq/L (ref 19–32)
Chloride: 102 mEq/L (ref 96–112)
Creatinine, Ser: 1.2 mg/dL (ref 0.40–1.20)
GFR: 44.45 mL/min — AB (ref >60.00)
GLUCOSE: 94 mg/dL (ref 70–99)
POTASSIUM: 4.6 meq/L (ref 3.5–5.1)
SODIUM: 138 meq/L (ref 135–145)

## 2017-09-15 MED ORDER — LACTULOSE 10 GM/15ML PO SOLN: 10.0000 g | Freq: Every day | ORAL | 0 refills | Status: DC | PRN

## 2017-09-15 NOTE — Telephone Encounter (Signed)
Copied from [place] [phone]. Topic: Inquiry >> Sep 14, 2017  9:29 AM Scherrie Gerlach wrote: Reason for CRM: pt states she is to report about her bowel movements.  Pt states they have not improved, about the same. Pt has labs on Monday, no office visits scheduled.   >> Sep 15, 2017  4:21 PM Percell Belt A wrote: Pt called in and said that she called the pharmacy and they have not rec'd any meds.  She would like to know if this is going to be called in today.  She stated that she really needed it before the weekend?

## 2017-09-15 NOTE — Addendum Note (Signed)
Addended by: Leone Haven on: 09/15/2017 05:04 PM   Modules accepted: Orders

## 2017-09-15 NOTE — Telephone Encounter (Signed)
Last OV 09/05/17 last filled by Dr.Cook  Enalapril 01/13/17 90 1rf Triamterene 01/13/17 90 1rf Oxybutynin 02/16/17 90 1rf

## 2017-09-15 NOTE — Telephone Encounter (Signed)
Lactulose sent to pharmacy

## 2017-09-15 NOTE — Telephone Encounter (Signed)
Lactulose just sent to the pharmacy.

## 2017-09-15 NOTE — Telephone Encounter (Signed)
Patient notified and would like this sent to [facility]

## 2017-09-15 NOTE — Telephone Encounter (Signed)
Please advise 

## 2017-09-18 ENCOUNTER — Other Ambulatory Visit: Payer: Medicare Other

## 2017-09-18 NOTE — Telephone Encounter (Signed)
Notified patient medication at pharmacy she verbalized understanding.

## 2017-09-19 ENCOUNTER — Telehealth: Payer: Self-pay | Admitting: Family Medicine

## 2017-09-19 DIAGNOSIS — I739 Peripheral vascular disease, unspecified: Secondary | ICD-10-CM | POA: Diagnosis not present

## 2017-09-19 DIAGNOSIS — I509 Heart failure, unspecified: Secondary | ICD-10-CM | POA: Diagnosis not present

## 2017-09-19 DIAGNOSIS — J449 Chronic obstructive pulmonary disease, unspecified: Secondary | ICD-10-CM | POA: Diagnosis not present

## 2017-09-19 DIAGNOSIS — K219 Gastro-esophageal reflux disease without esophagitis: Secondary | ICD-10-CM | POA: Diagnosis not present

## 2017-09-19 DIAGNOSIS — L89611 Pressure ulcer of right heel, stage 1: Secondary | ICD-10-CM | POA: Diagnosis not present

## 2017-09-19 DIAGNOSIS — I110 Hypertensive heart disease with heart failure: Secondary | ICD-10-CM | POA: Diagnosis not present

## 2017-09-19 NOTE — Telephone Encounter (Signed)
Please advise 

## 2017-09-19 NOTE — Telephone Encounter (Signed)
Is this ok to give?

## 2017-09-19 NOTE — Telephone Encounter (Signed)
I am unsure if this is ok to share with them. Please check with [name] or [name] to find out. If it is ok, let them know I placed her on lactulose for constipation.

## 2017-09-19 NOTE — Telephone Encounter (Signed)
Please advise, is this ok to inform [facility] of?

## 2017-09-19 NOTE — Telephone Encounter (Signed)
Yes it is fine to inform [facility].

## 2017-09-19 NOTE — Telephone Encounter (Signed)
Copied from [place] [id number]. Topic: Quick Communication - See Telephone Encounter >> Sep 19, 2017  3:09 PM Robina Ade, Helene Kelp D wrote: CRM for notification. See Telephone encounter for: 09/19/17. Linda Click with [facility] called and would like to know the patient lab results and what medication the provider put patient on. She can be reached at [phone].

## 2017-09-20 ENCOUNTER — Telehealth: Payer: Self-pay

## 2017-09-20 NOTE — Telephone Encounter (Signed)
Copied from [place] [phone]. Topic: Quick Communication - Lab Results >> Sep 20, 2017  9:03 AM Hewitt Shorts wrote: Looking for lab results   Best number [phone]

## 2017-09-20 NOTE — Telephone Encounter (Signed)
Patient notified of lab results, she would like to know if she needs to continue taking the b12 supplement?

## 2017-09-20 NOTE — Telephone Encounter (Signed)
Patient notified

## 2017-09-20 NOTE — Telephone Encounter (Signed)
She can continue the over-the-counter B12 supplement.

## 2017-09-27 ENCOUNTER — Telehealth: Payer: Self-pay | Admitting: Family Medicine

## 2017-09-27 DIAGNOSIS — I509 Heart failure, unspecified: Secondary | ICD-10-CM | POA: Diagnosis not present

## 2017-09-27 DIAGNOSIS — L89611 Pressure ulcer of right heel, stage 1: Secondary | ICD-10-CM | POA: Diagnosis not present

## 2017-09-27 DIAGNOSIS — I739 Peripheral vascular disease, unspecified: Secondary | ICD-10-CM | POA: Diagnosis not present

## 2017-09-27 DIAGNOSIS — K219 Gastro-esophageal reflux disease without esophagitis: Secondary | ICD-10-CM | POA: Diagnosis not present

## 2017-09-27 DIAGNOSIS — J449 Chronic obstructive pulmonary disease, unspecified: Secondary | ICD-10-CM | POA: Diagnosis not present

## 2017-09-27 DIAGNOSIS — I110 Hypertensive heart disease with heart failure: Secondary | ICD-10-CM | POA: Diagnosis not present

## 2017-09-27 MED ORDER — LACTULOSE 10 GM/15ML PO SOLN: 20.0000 g | Freq: Every day | ORAL | 0 refills | Status: DC | PRN

## 2017-09-27 NOTE — Telephone Encounter (Signed)
We can increase the dosage.  I sent the new dosage to her pharmacy.  If she does not have a bowel movement after several days of this she should be evaluated.  If she has abdominal pain or is passing blood or has nausea or vomiting she needs to be evaluated.  Thanks.

## 2017-09-27 NOTE — Telephone Encounter (Signed)
Please advise 

## 2017-09-27 NOTE — Telephone Encounter (Signed)
Left detailed message to notify [facility] with [facility]

## 2017-09-27 NOTE — Telephone Encounter (Signed)
Copied from [place] [id number]. Topic: Quick Communication - See Telephone Encounter >> Sep 27, 2017  2:03 PM Rutherford Nail, NT wrote: CRM for notification. See Telephone encounter for: 09/27/17. [name] with [facility] calling and states that Dr Caryl Bis order Lactulose 15 ml for constipation for the patient. Patient picked it up at the pharmacy and started last Tuesday night(09/19/17) and took 2 colace for 2 nights following. Did not have BM until Friday(09/22/17). Has been taking 15 ml every night since and has not had a bowel movement. Wondering if she can increase dosage to help with constipation? Please advise. CB#: [phone]

## 2017-10-03 DIAGNOSIS — J449 Chronic obstructive pulmonary disease, unspecified: Secondary | ICD-10-CM | POA: Diagnosis not present

## 2017-10-03 DIAGNOSIS — L89611 Pressure ulcer of right heel, stage 1: Secondary | ICD-10-CM | POA: Diagnosis not present

## 2017-10-03 DIAGNOSIS — I509 Heart failure, unspecified: Secondary | ICD-10-CM | POA: Diagnosis not present

## 2017-10-03 DIAGNOSIS — I110 Hypertensive heart disease with heart failure: Secondary | ICD-10-CM | POA: Diagnosis not present

## 2017-10-03 DIAGNOSIS — K219 Gastro-esophageal reflux disease without esophagitis: Secondary | ICD-10-CM | POA: Diagnosis not present

## 2017-10-03 DIAGNOSIS — I739 Peripheral vascular disease, unspecified: Secondary | ICD-10-CM | POA: Diagnosis not present

## 2017-10-10 ENCOUNTER — Telehealth: Payer: Self-pay | Admitting: Family Medicine

## 2017-10-10 NOTE — Telephone Encounter (Signed)
Verbal order given  

## 2017-10-10 NOTE — Telephone Encounter (Signed)
Verbal orders can be given. 

## 2017-10-10 NOTE — Telephone Encounter (Signed)
Copied from [place] [phone]. Topic: Inquiry >> Oct 10, 2017  2:55 PM Scherrie Gerlach wrote: Reason for CRM: [name] with [facility] calling for verbal to continue home health nursing. It is time for re cert

## 2017-10-10 NOTE — Telephone Encounter (Signed)
Please advise 

## 2017-10-12 DIAGNOSIS — I739 Peripheral vascular disease, unspecified: Secondary | ICD-10-CM | POA: Diagnosis not present

## 2017-10-12 DIAGNOSIS — I509 Heart failure, unspecified: Secondary | ICD-10-CM | POA: Diagnosis not present

## 2017-10-12 DIAGNOSIS — L89611 Pressure ulcer of right heel, stage 1: Secondary | ICD-10-CM | POA: Diagnosis not present

## 2017-10-12 DIAGNOSIS — J449 Chronic obstructive pulmonary disease, unspecified: Secondary | ICD-10-CM | POA: Diagnosis not present

## 2017-10-12 DIAGNOSIS — K219 Gastro-esophageal reflux disease without esophagitis: Secondary | ICD-10-CM | POA: Diagnosis not present

## 2017-10-12 DIAGNOSIS — I110 Hypertensive heart disease with heart failure: Secondary | ICD-10-CM | POA: Diagnosis not present

## 2017-10-16 DIAGNOSIS — I110 Hypertensive heart disease with heart failure: Secondary | ICD-10-CM | POA: Diagnosis not present

## 2017-10-16 DIAGNOSIS — J449 Chronic obstructive pulmonary disease, unspecified: Secondary | ICD-10-CM | POA: Diagnosis not present

## 2017-10-16 DIAGNOSIS — I739 Peripheral vascular disease, unspecified: Secondary | ICD-10-CM | POA: Diagnosis not present

## 2017-10-16 DIAGNOSIS — L89611 Pressure ulcer of right heel, stage 1: Secondary | ICD-10-CM | POA: Diagnosis not present

## 2017-10-16 DIAGNOSIS — K219 Gastro-esophageal reflux disease without esophagitis: Secondary | ICD-10-CM | POA: Diagnosis not present

## 2017-10-16 DIAGNOSIS — I509 Heart failure, unspecified: Secondary | ICD-10-CM | POA: Diagnosis not present

## 2017-10-17 ENCOUNTER — Other Ambulatory Visit: Payer: Self-pay | Admitting: Physical Medicine and Rehabilitation

## 2017-10-17 DIAGNOSIS — M5416 Radiculopathy, lumbar region: Secondary | ICD-10-CM

## 2017-10-21 DIAGNOSIS — Z9981 Dependence on supplemental oxygen: Secondary | ICD-10-CM | POA: Diagnosis not present

## 2017-10-21 DIAGNOSIS — R238 Other skin changes: Secondary | ICD-10-CM | POA: Diagnosis not present

## 2017-10-21 DIAGNOSIS — I110 Hypertensive heart disease with heart failure: Secondary | ICD-10-CM | POA: Diagnosis not present

## 2017-10-21 DIAGNOSIS — J449 Chronic obstructive pulmonary disease, unspecified: Secondary | ICD-10-CM | POA: Diagnosis not present

## 2017-10-21 DIAGNOSIS — M5412 Radiculopathy, cervical region: Secondary | ICD-10-CM | POA: Diagnosis not present

## 2017-10-21 DIAGNOSIS — I739 Peripheral vascular disease, unspecified: Secondary | ICD-10-CM | POA: Diagnosis not present

## 2017-10-21 DIAGNOSIS — M19041 Primary osteoarthritis, right hand: Secondary | ICD-10-CM | POA: Diagnosis not present

## 2017-10-21 DIAGNOSIS — Z8701 Personal history of pneumonia (recurrent): Secondary | ICD-10-CM | POA: Diagnosis not present

## 2017-10-21 DIAGNOSIS — K219 Gastro-esophageal reflux disease without esophagitis: Secondary | ICD-10-CM | POA: Diagnosis not present

## 2017-10-21 DIAGNOSIS — M5116 Intervertebral disc disorders with radiculopathy, lumbar region: Secondary | ICD-10-CM | POA: Diagnosis not present

## 2017-10-21 DIAGNOSIS — R32 Unspecified urinary incontinence: Secondary | ICD-10-CM | POA: Diagnosis not present

## 2017-10-21 DIAGNOSIS — Z7982 Long term (current) use of aspirin: Secondary | ICD-10-CM | POA: Diagnosis not present

## 2017-10-21 DIAGNOSIS — Z87440 Personal history of urinary (tract) infections: Secondary | ICD-10-CM | POA: Diagnosis not present

## 2017-10-21 DIAGNOSIS — Z742 Need for assistance at home and no other household member able to render care: Secondary | ICD-10-CM | POA: Diagnosis not present

## 2017-10-21 DIAGNOSIS — M19042 Primary osteoarthritis, left hand: Secondary | ICD-10-CM | POA: Diagnosis not present

## 2017-10-21 DIAGNOSIS — I509 Heart failure, unspecified: Secondary | ICD-10-CM | POA: Diagnosis not present

## 2017-10-23 ENCOUNTER — Telehealth: Payer: Self-pay

## 2017-10-23 ENCOUNTER — Encounter: Payer: Self-pay | Admitting: Family Medicine

## 2017-10-23 DIAGNOSIS — I110 Hypertensive heart disease with heart failure: Secondary | ICD-10-CM | POA: Diagnosis not present

## 2017-10-23 DIAGNOSIS — M5116 Intervertebral disc disorders with radiculopathy, lumbar region: Secondary | ICD-10-CM | POA: Diagnosis not present

## 2017-10-23 DIAGNOSIS — Z87440 Personal history of urinary (tract) infections: Secondary | ICD-10-CM | POA: Diagnosis not present

## 2017-10-23 DIAGNOSIS — J449 Chronic obstructive pulmonary disease, unspecified: Secondary | ICD-10-CM | POA: Diagnosis not present

## 2017-10-23 DIAGNOSIS — I739 Peripheral vascular disease, unspecified: Secondary | ICD-10-CM | POA: Diagnosis not present

## 2017-10-23 DIAGNOSIS — I509 Heart failure, unspecified: Secondary | ICD-10-CM | POA: Diagnosis not present

## 2017-10-23 DIAGNOSIS — R238 Other skin changes: Secondary | ICD-10-CM | POA: Diagnosis not present

## 2017-10-23 DIAGNOSIS — R32 Unspecified urinary incontinence: Secondary | ICD-10-CM | POA: Diagnosis not present

## 2017-10-23 NOTE — Telephone Encounter (Signed)
Copied from [place] [phone]. Topic: Appointment Scheduling - Scheduling Inquiry for Clinic >> Oct 23, 2017  9:37 AM Boyd Kerbs wrote: Reason for CRM:   She is having pain urinating, overall condition is not good she is saying. She says this has been going on for a week.  No openings, Asking if could be worked in today   Returned patients call. She states that she has been having frequent urination for a few days, and fever last night. She states that she did have some burning yesterday but it has gone away. She wants to be seen today but Dr. Caryl Bis and other providers in the office do not have any openings. I informed her that Sharene Butters will be in the office tomorrow. She states she doesn't have transportation but will see if her home health nurse can come see her, I informed if she could not to give the office a call back. Patient verbalized understanding.

## 2017-10-23 NOTE — Telephone Encounter (Signed)
Copied from [place] [phone]. Topic: Inquiry >> Oct 23, 2017 11:52 AM Oliver Pila B wrote: Reason for CRM: Amy from [facility] called for verbal orders for the pt, she spoke of neeeding a U.A. For CNS, call [phone] leave a message for approval if needed

## 2017-10-23 NOTE — Telephone Encounter (Signed)
Cindy Graham from [facility] states the patient is having chills, decreased appetite, foul odor in urine, feels like she is not able to urinate enough since last. Symptoms started last Thursday.

## 2017-10-23 NOTE — Telephone Encounter (Signed)
Please advise 

## 2017-10-23 NOTE — Telephone Encounter (Signed)
I would advise she be evaluated if she is having chills to ensure that she does not have a more serious infection. If they refuse to have her evaluated it would be ok to do a UA and culture.

## 2017-10-23 NOTE — Telephone Encounter (Signed)
I informed patient that she will need to be evaluated she states her son who takes her to appointments is out of town. I called Amy and gave ok for UA and culture. Informed her that patient needs to be evaluated if symptoms worsen.

## 2017-10-23 NOTE — Telephone Encounter (Signed)
Please find out what they need.  It looks like they are requesting a UA though I need to know if she is having symptoms.  Please find out what symptoms she is having.  Thanks.

## 2017-10-25 ENCOUNTER — Telehealth: Payer: Self-pay | Admitting: Family Medicine

## 2017-10-25 DIAGNOSIS — R238 Other skin changes: Secondary | ICD-10-CM | POA: Diagnosis not present

## 2017-10-25 DIAGNOSIS — J449 Chronic obstructive pulmonary disease, unspecified: Secondary | ICD-10-CM | POA: Diagnosis not present

## 2017-10-25 DIAGNOSIS — M5116 Intervertebral disc disorders with radiculopathy, lumbar region: Secondary | ICD-10-CM | POA: Diagnosis not present

## 2017-10-25 DIAGNOSIS — I110 Hypertensive heart disease with heart failure: Secondary | ICD-10-CM | POA: Diagnosis not present

## 2017-10-25 DIAGNOSIS — I739 Peripheral vascular disease, unspecified: Secondary | ICD-10-CM | POA: Diagnosis not present

## 2017-10-25 DIAGNOSIS — I509 Heart failure, unspecified: Secondary | ICD-10-CM | POA: Diagnosis not present

## 2017-10-25 MED ORDER — CIPROFLOXACIN HCL 250 MG PO TABS: 250.0000 mg | ORAL_TABLET | Freq: Two times a day (BID) | ORAL | 0 refills | Status: DC

## 2017-10-25 NOTE — Telephone Encounter (Signed)
Copied from [place] [id number]. Topic: Quick Communication - See Telephone Encounter >> Oct 25, 2017  1:32 PM Selinda Flavin B, NT wrote: CRM for notification. See Telephone encounter for: 10/25/17. Amy with [facility] calling and states that she dropped a UA off at [facility] this morning and is wanting to touch base with the patient with the results. Please advise. CB#: [phone]

## 2017-10-25 NOTE — Telephone Encounter (Signed)
Patient notified and verbalized understanding. 

## 2017-10-25 NOTE — Telephone Encounter (Signed)
Placed on your desk. 

## 2017-10-25 NOTE — Telephone Encounter (Signed)
Please contact the patient and let her know that her urine grew out bacteria.  In the past she has been treated with ciprofloxacin and it should be sensitive to this.  I will send that to her pharmacy.  If she is not improving she needs to be reevaluated.

## 2017-10-28 ENCOUNTER — Ambulatory Visit
Admission: RE | Admit: 2017-10-28 | Discharge: 2017-10-28 | Disposition: A | Discharge status: Home / Self Care | Payer: Medicare Other | Source: Ambulatory Visit | Attending: Physical Medicine and Rehabilitation | Admitting: Physical Medicine and Rehabilitation

## 2017-10-28 DIAGNOSIS — M419 Scoliosis, unspecified: Secondary | ICD-10-CM | POA: Diagnosis not present

## 2017-10-28 DIAGNOSIS — M2578 Osteophyte, vertebrae: Secondary | ICD-10-CM | POA: Insufficient documentation

## 2017-10-28 DIAGNOSIS — M5416 Radiculopathy, lumbar region: Secondary | ICD-10-CM

## 2017-10-28 DIAGNOSIS — M48061 Spinal stenosis, lumbar region without neurogenic claudication: Secondary | ICD-10-CM | POA: Diagnosis not present

## 2017-10-28 DIAGNOSIS — M5126 Other intervertebral disc displacement, lumbar region: Secondary | ICD-10-CM | POA: Diagnosis not present

## 2017-10-28 DIAGNOSIS — M4726 Other spondylosis with radiculopathy, lumbar region: Secondary | ICD-10-CM | POA: Diagnosis not present

## 2017-10-30 ENCOUNTER — Ambulatory Visit: Payer: Self-pay

## 2017-10-30 ENCOUNTER — Other Ambulatory Visit: Payer: Self-pay | Admitting: Family Medicine

## 2017-10-30 DIAGNOSIS — M5116 Intervertebral disc disorders with radiculopathy, lumbar region: Secondary | ICD-10-CM | POA: Diagnosis not present

## 2017-10-30 DIAGNOSIS — I509 Heart failure, unspecified: Secondary | ICD-10-CM | POA: Diagnosis not present

## 2017-10-30 DIAGNOSIS — I739 Peripheral vascular disease, unspecified: Secondary | ICD-10-CM | POA: Diagnosis not present

## 2017-10-30 DIAGNOSIS — R238 Other skin changes: Secondary | ICD-10-CM | POA: Diagnosis not present

## 2017-10-30 DIAGNOSIS — J449 Chronic obstructive pulmonary disease, unspecified: Secondary | ICD-10-CM | POA: Diagnosis not present

## 2017-10-30 DIAGNOSIS — I110 Hypertensive heart disease with heart failure: Secondary | ICD-10-CM | POA: Diagnosis not present

## 2017-10-30 NOTE — Telephone Encounter (Signed)
Patient called in with c/o "sore to leg." She says "it has been there for about 4 weeks, a small place the size of a pinto bean. I forgot it was there and scratched it. Now it is red and looks like it has pus in the center of it, it's tender to touch." I asked how did it initially happen, she says "I don't remember." I asked about fever, she says "no fever." According to protocol, see PCP within 24 hours, no availability with PCP or any provider for today or tomorrow, Fransisco Beau, [facility] called to ask if the patient can be scheduled in a Same Day slot, he says the only one is on Wednesday at 1315 with Mable Paris, patient agrees to that appointment, Fransisco Beau, [facility] scheduled the patient. Care advice given, patient verbalized understanding.  Reason for Disposition . [1] Looks infected (spreading redness, pus) AND [2] no fever  Answer Assessment - Initial Assessment Questions 1. APPEARANCE of INJURY: "What does the injury look like?"      Red, tender 2. SIZE: "How large is the cut?"      Size of a bean 3. BLEEDING: "Is it bleeding now?" If so, ask: "Is it difficult to stop?"     Not 4. LOCATION: "Where is the injury located?"      Right lower, outside of leg near knee 5. ONSET: "How long ago did the injury occur?"      About 3-4 weeks 6. MECHANISM: "Tell me how it happened."      Unknown 7. TETANUS: "When was the last tetanus booster?"     Unknown 8. PREGNANCY: "Is there any chance you are pregnant?" "When was your last menstrual period?"     No  Protocols used: SKIN INJURY-A-AH

## 2017-10-30 NOTE — Telephone Encounter (Signed)
Patient called and asked about her UTI symptoms, she says "I was given only 6 pills and I'm still having some pressure. I wanted to know if he can send in some more to knock the infection on out." I advised I will send this to Dr. Caryl Bis for review and recommendation, she verbalized understanding.  Ciprofloxacin-last refill 10/25/17 6 tabs/0 refills Lactulose refill Last OV:09/05/17 for Lactulose Last refill:09/27/17 240 ml/0 refills HAF:[id number] Pharmacy: [place], [place] - [phone] (Phone) [phone] (Fax)

## 2017-10-30 NOTE — Telephone Encounter (Signed)
Copied from [place] [id number]. Topic: Quick Communication - Rx Refill/Question >> Oct 30, 2017  1:18 PM Tye [place] wrote: [facility] pt: [name] [phone]  1. Medication: lactulose (CHRONULAC) 10 GM/15ML solution [[id number]] - pt is out and needing refill  2. ciprofloxacin (CIPRO) 250 MG tablet [[id number]] - for UTI; pt is still feeling the symptoms, please advise  3. Pt has a lesion on side of right leg that is not open, pt had scratched it and now its inflamed and tender to touch  If pt is needing an appt call pt to schedule

## 2017-11-01 ENCOUNTER — Ambulatory Visit (INDEPENDENT_AMBULATORY_CARE_PROVIDER_SITE_OTHER): Payer: Medicare Other | Admitting: Family

## 2017-11-01 ENCOUNTER — Encounter: Payer: Self-pay | Admitting: Family

## 2017-11-01 ENCOUNTER — Telehealth: Payer: Self-pay | Admitting: Family Medicine

## 2017-11-01 ENCOUNTER — Other Ambulatory Visit: Payer: Self-pay | Admitting: Family Medicine

## 2017-11-01 VITALS — BP 138/68 | HR 61 | Temp 98.0°F | Resp 15 | Wt 175.5 lb

## 2017-11-01 DIAGNOSIS — K5900 Constipation, unspecified: Secondary | ICD-10-CM

## 2017-11-01 DIAGNOSIS — S81802A Unspecified open wound, left lower leg, initial encounter: Secondary | ICD-10-CM | POA: Diagnosis not present

## 2017-11-01 MED ORDER — MUPIROCIN CALCIUM 2 % EX CREA
1.0000 | TOPICAL_CREAM | Freq: Three times a day (TID) | CUTANEOUS | 0 refills | Status: DC
Start: 2017-11-01 — End: 2018-06-16

## 2017-11-01 MED ORDER — LACTULOSE 10 GM/15ML PO SOLN: 20.0000 g | Freq: Every day | ORAL | 0 refills | Status: DC | PRN

## 2017-11-01 NOTE — Progress Notes (Signed)
Subjective:    Patient ID: Cindy Graham, female    DOB: 1921-09-19, 84 y.o.   MRN: [id number]  CC: GAE BIHL is a 84 y.o. female who presents today for an acute visit.    HPI: Chief complaint of red bump on right leg, x 4 weeks. Not bothering me at all, until 'scratched the top off'.  Tried neosporin with no relief.  with drainage. No calf pain. Has h/o ankle swelling, none today. Wearing compression stockings.  No SOB, cp, N, fever, V. Known calf size difference.   No recent change in activity. No recent surgeries.   H/o PVD, CKD   Recently treated for UTI with cipro by pcp. Feels better. No dysuria, urinary frequency.   History of chronic constipation.  Needs refill of lactulose.  This works well for her.     HISTORY:  Past Medical History:  Diagnosis Date  . Arthritis    osteo  . COPD (chronic obstructive pulmonary disease) ([place])   . GERD (gastroesophageal reflux disease)   . Heart murmur   . Hernia   . Hyperlipidemia   . Hypertension   . Melanoma ([place])    face  . Syncope and collapse   . Urine incontinence    Past Surgical History:  Procedure Laterality Date  . ABDOMINAL HYSTERECTOMY    . CHOLECYSTECTOMY    . HERNIA REPAIR     Family History  Problem Relation Age of Onset  . Stroke Father   . Heart disease Sister   . Cancer Brother        colon  . Heart disease Brother   . Cancer Sister        colon  . Heart attack Brother   . Heart attack Son   . Parkinson's disease Son     Allergies: Statins; Tramadol; Neosporin [neomycin-bacitracin zn-polymyx]; Penicillins cross reactors; Sulfa antibiotics; and Tetracyclines & related Current Outpatient Medications on File Prior to Visit  Medication Sig Dispense Refill  . aspirin 81 MG tablet Take 81 mg by mouth daily.      . Biotin 1000 MCG tablet Take 1,000 mcg by mouth daily.      . cholecalciferol (VITAMIN D) 400 UNITS TABS Take 400 Units by mouth 2 (two) times daily.      . enalapril (VASOTEC) 20 MG  tablet TAKE ONE TABLET BY MOUTH EVERY DAY 90 tablet 1  . Flaxseed, Linseed, (FLAX SEED OIL) 1000 MG CAPS Take 1 capsule by mouth daily.      . fluticasone (FLOVENT HFA) 220 MCG/ACT inhaler Inhale 2 puffs into the lungs 2 (two) times daily. 1 Inhaler 12  . gabapentin (NEURONTIN) 300 MG capsule TAKE ONE (1) CAPSULE EACH DAY 90 capsule 3  . Multiple Vitamin (MULTIVITAMIN) capsule Take 1 capsule by mouth daily.      . nitroGLYCERIN (NITROSTAT) 0.3 MG SL tablet DISSOLVE 1 TABLET UNDER TONGUE EVERY 5 MINUTES FOR 3 DOSES AS NEEDED FOR CHEST PAIN. IF NO RELIEF GO TO ER. 30 tablet 0  . NUCYNTA 50 MG TABS tablet Take 50 mg by mouth 2 (two) times daily as needed (Take 1 tablet twice a day as needed).     . nystatin cream (MYCOSTATIN) Apply 1 application topically 2 (two) times daily. 30 g 6  . omeprazole (PRILOSEC) 20 MG capsule TAKE ONE (1) CAPSULE EACH DAY 90 capsule 3  . oxybutynin (DITROPAN) 5 MG tablet TAKE ONE TABLET BY MOUTH EVERY DAY 90 tablet 1  . propranolol ER (  INDERAL LA) 60 MG 24 hr capsule TAKE 1 CAPSULE BY MOUTH EVERY DAY 90 capsule 1  . sertraline (ZOLOFT) 100 MG tablet TAKE ONE (1) TABLET EACH DAY 90 tablet 3  . triamterene-hydrochlorothiazide (DYAZIDE) 37.5-25 MG capsule TAKE 1 CAPSULE BY MOUTH EVERY DAY 90 capsule 1  . verapamil (CALAN-SR) 180 MG CR tablet Take 1 tablet (180 mg total) by mouth at bedtime. 90 tablet 3   No current facility-administered medications on file prior to visit.     Social History   Tobacco Use  . Smoking status: Never Smoker  . Smokeless tobacco: Never Used  Substance Use Topics  . Alcohol use: No  . Drug use: No    Review of Systems  Constitutional: Negative for chills and fever.  Respiratory: Negative for cough and shortness of breath.   Cardiovascular: Negative for chest pain, palpitations and leg swelling.  Gastrointestinal: Positive for constipation. Negative for abdominal distention, abdominal pain, nausea and vomiting.  Genitourinary: Negative  for difficulty urinating, dysuria and frequency.  Skin: Positive for color change and wound.      Objective:    BP 138/68 (BP Location: Right Arm, Patient Position: Sitting, Cuff Size: Normal)   Pulse 61   Temp 98 F (36.7 C) (Oral)   Resp 15   Wt 175 lb 8 oz (79.6 kg)   SpO2 98%   BMI 35.45 kg/m    Physical Exam  Constitutional: She appears well-developed and well-nourished.  Eyes: Conjunctivae are normal.  Cardiovascular: Normal rate, regular rhythm, normal heart sounds and normal pulses.  No LE edema, palpable cords or masses.   asymmetry in calf size when compared bilaterally, R> L   varicosities noted. LE warm and palpable pedal pulses.   Pulmonary/Chest: Effort normal and breath sounds normal. She has no wheezes. She has no rhonchi. She has no rales.  Neurological: She is alert.  Skin: Skin is warm and dry.     Approximately 2 cm well-circumscribed area of erythema.  No increased warmth, discharge, streaking.  Small scab noted in the middle.  Psychiatric: She has a normal mood and affect. Her speech is normal and behavior is normal. Thought content normal.  Vitals reviewed.      Assessment & Plan:   Problem List Items Addressed This Visit      Other   Constipation    Refilled lactulose patient today.  Advised her to let us know because patient not resolved.  Also advised that she is to continue follow-up with PCP      Wound of left leg - Primary    Suspect localized bacterial infection. Patient well appearing. Known right calf greater than left. Negative DVT 01/2017.  Patient was recently treated with ciprofloxacin.  We jointly agreed that likely this infection appears to be superficial and a topical antibiotic appropriate.  Advised close vigilance to patient and daughter was with her today.  Patient let me know if no improvement or if she develops systemic features       Relevant Medications   mupirocin cream (BACTROBAN) 2 %        I have discontinued  Minette Headland. Reeb's polyethylene glycol powder and ciprofloxacin. I am also having her start on mupirocin cream. Additionally, I am having her maintain her aspirin, multivitamin, cholecalciferol, Biotin, Flax Seed Oil, NUCYNTA, nitroGLYCERIN, sertraline, omeprazole, gabapentin, propranolol ER, verapamil, nystatin cream, fluticasone, triamterene-hydrochlorothiazide, enalapril, oxybutynin, and lactulose.   Meds ordered this encounter  Medications  . mupirocin cream (BACTROBAN) 2 %  Sig: Apply 1 application topically 3 (three) times daily.    Dispense:  15 g    Refill:  0    Order Specific Question:   Supervising Provider    Answer:   Derrel Nip, TERESA L [[id number]]  . lactulose (CHRONULAC) 10 GM/15ML solution    Sig: Take 30 mLs (20 g total) by mouth daily as needed for mild constipation.    Dispense:  240 mL    Refill:  0    Order Specific Question:   Supervising Provider    Answer:   Crecencio Mc [[id number]]    Return precautions given.   Risks, benefits, and alternatives of the medications and treatment plan prescribed today were discussed, and patient expressed understanding.   Education regarding symptom management and diagnosis given to patient on AVS.  Continue to follow with Leone Haven, MD for routine health maintenance.   Clearnce Hasten and I agreed with plan.   Mable Paris, FNP

## 2017-11-01 NOTE — Assessment & Plan Note (Signed)
Suspect localized bacterial infection. Patient well appearing. Known right calf greater than left. Negative DVT 01/2017.  Patient was recently treated with ciprofloxacin.  We jointly agreed that likely this infection appears to be superficial and a topical antibiotic appropriate.  Advised close vigilance to patient and daughter was with her today.  Patient let me know if no improvement or if she develops systemic features

## 2017-11-01 NOTE — Telephone Encounter (Signed)
Copied from [place] [id number]. Topic: Quick Communication - Rx Refill/Question >> Nov 01, 2017  2:40 PM Lennox Solders wrote: Medication: mupirocin cream was sent to pharm. The pharm would like to know if md would sent in mupirocin ointment due to cost is cheaper (Agent: If no, request that the patient contact the pharmacy for the refill.) (Agent: If yes, when and what did the pharmacy advise?)  Preferred Pharmacy (with phone number or street name): [facility] in [place]: Please be advised that RX refills may take up to 3 business days. We ask that you follow-up with your pharmacy.

## 2017-11-01 NOTE — Assessment & Plan Note (Signed)
Refilled lactulose patient today.  Advised her to let us know because patient not resolved.  Also advised that she is to continue follow-up with PCP

## 2017-11-01 NOTE — Patient Instructions (Addendum)
Please call and let us know if constipation doesn't resolve with lactulose. May follow up with [facility] for this.   Trial of bactroban on right leg wound to cover for infection. Stay vigilant.  Please let me know of any increased redness, increased pain. If you have swelling, fever, please call us right away  Pleasure meeting you

## 2017-11-02 DIAGNOSIS — M79672 Pain in left foot: Secondary | ICD-10-CM | POA: Diagnosis not present

## 2017-11-02 DIAGNOSIS — B351 Tinea unguium: Secondary | ICD-10-CM | POA: Diagnosis not present

## 2017-11-02 DIAGNOSIS — M79671 Pain in right foot: Secondary | ICD-10-CM | POA: Diagnosis not present

## 2017-11-03 ENCOUNTER — Telehealth: Payer: Self-pay | Admitting: *Deleted

## 2017-11-03 ENCOUNTER — Emergency Department: Payer: Medicare Other

## 2017-11-03 ENCOUNTER — Emergency Department
Admission: EM | Admit: 2017-11-03 | Discharge: 2017-11-03 | Disposition: A | Discharge status: Home / Self Care | Payer: Medicare Other | Attending: Student in an Organized Health Care Education/Training Program | Admitting: Student in an Organized Health Care Education/Training Program

## 2017-11-03 ENCOUNTER — Other Ambulatory Visit: Payer: Self-pay

## 2017-11-03 ENCOUNTER — Telehealth: Payer: Self-pay

## 2017-11-03 DIAGNOSIS — W19XXXA Unspecified fall, initial encounter: Secondary | ICD-10-CM

## 2017-11-03 DIAGNOSIS — Y999 Unspecified external cause status: Secondary | ICD-10-CM | POA: Diagnosis not present

## 2017-11-03 DIAGNOSIS — R51 Headache: Secondary | ICD-10-CM | POA: Diagnosis not present

## 2017-11-03 DIAGNOSIS — M545 Low back pain: Secondary | ICD-10-CM | POA: Diagnosis not present

## 2017-11-03 DIAGNOSIS — J45909 Unspecified asthma, uncomplicated: Secondary | ICD-10-CM | POA: Diagnosis not present

## 2017-11-03 DIAGNOSIS — M791 Myalgia, unspecified site: Secondary | ICD-10-CM

## 2017-11-03 DIAGNOSIS — Y939 Activity, unspecified: Secondary | ICD-10-CM | POA: Insufficient documentation

## 2017-11-03 DIAGNOSIS — R079 Chest pain, unspecified: Secondary | ICD-10-CM | POA: Diagnosis not present

## 2017-11-03 DIAGNOSIS — S0003XA Contusion of scalp, initial encounter: Secondary | ICD-10-CM | POA: Insufficient documentation

## 2017-11-03 DIAGNOSIS — Y92010 Kitchen of single-family (private) house as the place of occurrence of the external cause: Secondary | ICD-10-CM | POA: Insufficient documentation

## 2017-11-03 DIAGNOSIS — Z79899 Other long term (current) drug therapy: Secondary | ICD-10-CM | POA: Diagnosis not present

## 2017-11-03 DIAGNOSIS — W0110XA Fall on same level from slipping, tripping and stumbling with subsequent striking against unspecified object, initial encounter: Secondary | ICD-10-CM | POA: Diagnosis not present

## 2017-11-03 DIAGNOSIS — S199XXA Unspecified injury of neck, initial encounter: Secondary | ICD-10-CM | POA: Diagnosis not present

## 2017-11-03 DIAGNOSIS — S0990XA Unspecified injury of head, initial encounter: Secondary | ICD-10-CM | POA: Diagnosis not present

## 2017-11-03 DIAGNOSIS — M25512 Pain in left shoulder: Secondary | ICD-10-CM | POA: Diagnosis not present

## 2017-11-03 DIAGNOSIS — N183 Chronic kidney disease, stage 3 (moderate): Secondary | ICD-10-CM | POA: Diagnosis not present

## 2017-11-03 DIAGNOSIS — M542 Cervicalgia: Secondary | ICD-10-CM | POA: Diagnosis not present

## 2017-11-03 DIAGNOSIS — M25552 Pain in left hip: Secondary | ICD-10-CM | POA: Diagnosis not present

## 2017-11-03 DIAGNOSIS — I129 Hypertensive chronic kidney disease with stage 1 through stage 4 chronic kidney disease, or unspecified chronic kidney disease: Secondary | ICD-10-CM | POA: Diagnosis not present

## 2017-11-03 DIAGNOSIS — Y92009 Unspecified place in unspecified non-institutional (private) residence as the place of occurrence of the external cause: Secondary | ICD-10-CM

## 2017-11-03 DIAGNOSIS — M7918 Myalgia, other site: Secondary | ICD-10-CM | POA: Insufficient documentation

## 2017-11-03 DIAGNOSIS — S4992XA Unspecified injury of left shoulder and upper arm, initial encounter: Secondary | ICD-10-CM | POA: Diagnosis not present

## 2017-11-03 DIAGNOSIS — S299XXA Unspecified injury of thorax, initial encounter: Secondary | ICD-10-CM | POA: Diagnosis not present

## 2017-11-03 DIAGNOSIS — I10 Essential (primary) hypertension: Secondary | ICD-10-CM | POA: Diagnosis not present

## 2017-11-03 LAB — CBC WITH DIFFERENTIAL/PLATELET
BASOS ABS: 0.1 10*3/uL (ref 0–0.1)
Basophils Relative: 1 %
EOS PCT: 3 %
Eosinophils Absolute: 0.3 10*3/uL (ref 0–0.7)
HCT: 38.3 % (ref 35.0–47.0)
Hemoglobin: 12.8 g/dL (ref 12.0–16.0)
LYMPHS ABS: 2.2 10*3/uL (ref 1.0–3.6)
LYMPHS PCT: 19 %
MCH: 32.1 pg (ref 26.0–34.0)
MCHC: 33.4 g/dL (ref 32.0–36.0)
MCV: 96 fL (ref 80.0–100.0)
Monocytes Absolute: 1 10*3/uL — ABNORMAL HIGH (ref 0.2–0.9)
Monocytes Relative: 9 %
NEUTROS PCT: 68 %
Neutro Abs: 8 10*3/uL — ABNORMAL HIGH (ref 1.4–6.5)
PLATELETS: 265 10*3/uL (ref 150–440)
RBC: 3.99 MIL/uL (ref 3.80–5.20)
RDW: 14.5 % (ref 11.5–14.5)
WBC: 11.6 10*3/uL — AB (ref 3.6–11.0)

## 2017-11-03 LAB — COMPREHENSIVE METABOLIC PANEL
ALT: 15 U/L (ref 14–54)
ANION GAP: 11 (ref 5–15)
AST: 32 U/L (ref 15–41)
Albumin: 3.5 g/dL (ref 3.5–5.0)
Alkaline Phosphatase: 66 U/L (ref 38–126)
BUN: 19 mg/dL (ref 6–20)
CO2: 25 mmol/L (ref 22–32)
Calcium: 9.2 mg/dL (ref 8.9–10.3)
Chloride: 94 mmol/L — ABNORMAL LOW (ref 101–111)
Creatinine, Ser: 1.06 mg/dL — ABNORMAL HIGH (ref 0.44–1.00)
GFR, EST AFRICAN AMERICAN: 50 mL/min — AB (ref >60)
GFR, EST NON AFRICAN AMERICAN: 44 mL/min — AB (ref >60)
Glucose, Bld: 104 mg/dL — ABNORMAL HIGH (ref 65–99)
POTASSIUM: 4.6 mmol/L (ref 3.5–5.1)
Sodium: 130 mmol/L — ABNORMAL LOW (ref 135–145)
TOTAL PROTEIN: 6.8 g/dL (ref 6.5–8.1)
Total Bilirubin: 0.8 mg/dL (ref 0.3–1.2)

## 2017-11-03 MED ORDER — DEXAMETHASONE SODIUM PHOSPHATE 10 MG/ML IJ SOLN
10.0000 mg | Freq: Once | INTRAMUSCULAR | Status: DC
  Filled 2017-11-03: qty 1

## 2017-11-03 MED ORDER — DEXAMETHASONE SODIUM PHOSPHATE 10 MG/ML IJ SOLN
10.0000 mg | Freq: Once | INTRAMUSCULAR | Status: AC
  Administered 2017-11-03: 10 mg via INTRAVENOUS

## 2017-11-03 MED ORDER — PREDNISONE 20 MG PO TABS: 20.0000 mg | ORAL_TABLET | Freq: Every day | ORAL | 0 refills | Status: AC

## 2017-11-03 MED ORDER — IOHEXOL 300 MG/ML  SOLN
60.0000 mL | Freq: Once | INTRAMUSCULAR | Status: AC | PRN
  Administered 2017-11-03: 60 mL via INTRAVENOUS
  Filled 2017-11-03: qty 60

## 2017-11-03 NOTE — Telephone Encounter (Signed)
Copied from [place] [phone]. Topic: Referral - Medical Records >> Nov 03, 2017  1:02 PM Scherrie Gerlach wrote: Reason for CRM: jill with [facility] pt care manager requesting notes from pt's visit yesterday to Fax: [phone]  They want to know what is going on with wound and how to address it.

## 2017-11-03 NOTE — Telephone Encounter (Signed)
Wound care instructions faxed [phone] . Will follow up Monday to see to make sure they received .

## 2017-11-03 NOTE — ED Notes (Signed)
See triage note  Presents s/p fall ..states she uses a walker but bent down to pick up a dish towel  Lost balance  Hit her head,left shoulder and hip area  Denies any LOC  Family at bedside

## 2017-11-03 NOTE — ED Provider Notes (Signed)
[facility] Emergency Department Provider Note  ____________________________________________  Time seen: Approximately 3:32 PM  I have reviewed the triage vital signs and the nursing notes.   HISTORY  Chief Complaint Fall    HPI Cindy Graham is a 84 y.o. female who presents the emergency department with multiple complaints status post a fall.  Patient reports that she was in her kitchen, became unsteady on her feet, fell backwards.   Patient reports that she struck her head during the fall.  She is unsure when she struck her head on.  She reports a large "knot" to the posterior scalp.  She endorses a headache but denies any loss of consciousness, vision changes.  She endorses neck pain, left shoulder pain, left upper arm pain, lower back, left hip pain.  Patient does have a history of spinal stenosis with chronic radicular symptoms in the left lower extremity.  She states that these are worsened but she has had no bowel or bladder dysfunction, saddle anesthesia, paresthesias.  No medications prior to arrival.  Patient reports that she is able to ambulate but she has significantly reduced motion to the left shoulder.  Patient has a history of osteoarthritis, COPD, GERD, hypertension, syncope and collapse.  Patient denies complaints with chronic medical problems.  Patient reports that she did not pass out during this episode.   Past Medical History:  Diagnosis Date  . Arthritis    osteo  . COPD (chronic obstructive pulmonary disease) ([place])   . GERD (gastroesophageal reflux disease)   . Heart murmur   . Hernia   . Hyperlipidemia   . Hypertension   . Melanoma ([place])    face  . Syncope and collapse   . Urine incontinence     Patient Active Problem List   Diagnosis Date Noted  . Wound of left leg 11/01/2017  . Constipation 09/05/2017  . Other fatigue 12/22/2016  . Tremor 03/29/2016  . Amaurosis fugax 12/25/2015  . Asthma 09/17/2015  . PVD (peripheral  vascular disease) ([place]) 05/28/2015  . B12 deficiency 08/29/2014  . Chronic back pain greater than 3 months duration 04/23/2013  . Chronic kidney disease (CKD), stage III (moderate) ([place]) 04/12/2012  . Hyperlipidemia LDL goal < 100 03/05/2012  . Osteoarthritis 07/06/2011  . Hypertension 02/21/2011  . Depression/Anxiety 02/21/2011    Past Surgical History:  Procedure Laterality Date  . ABDOMINAL HYSTERECTOMY    . CHOLECYSTECTOMY    . HERNIA REPAIR      Prior to Admission medications   Medication Sig Start Date End Date Taking? Authorizing Provider  aspirin 81 MG tablet Take 81 mg by mouth daily.      [provider]  Biotin 1000 MCG tablet Take 1,000 mcg by mouth daily.      [provider]  cholecalciferol (VITAMIN D) 400 UNITS TABS Take 400 Units by mouth 2 (two) times daily.      [provider]  enalapril (VASOTEC) 20 MG tablet TAKE ONE TABLET BY MOUTH EVERY DAY 09/15/17   Leone Haven, MD  Flaxseed, Linseed, (FLAX SEED OIL) 1000 MG CAPS Take 1 capsule by mouth daily.      [provider]  fluticasone (FLOVENT HFA) 220 MCG/ACT inhaler Inhale 2 puffs into the lungs 2 (two) times daily. 08/12/17   Leone Haven, MD  gabapentin (NEURONTIN) 300 MG capsule TAKE ONE (1) CAPSULE EACH DAY 11/22/16   Thersa Salt G, DO  lactulose (CHRONULAC) 10 GM/15ML solution Take 30 mLs (20 g  total) by mouth daily as needed for mild constipation. 11/01/17   Burnard Hawthorne, FNP  Multiple Vitamin (MULTIVITAMIN) capsule Take 1 capsule by mouth daily.      [provider]  mupirocin cream (BACTROBAN) 2 % Apply 1 application topically 3 (three) times daily. 11/01/17   Burnard Hawthorne, FNP  nitroGLYCERIN (NITROSTAT) 0.3 MG SL tablet DISSOLVE 1 TABLET UNDER TONGUE EVERY 5 MINUTES FOR 3 DOSES AS NEEDED FOR CHEST PAIN. IF NO RELIEF GO TO ER. 01/08/16   Leone Haven, MD  NUCYNTA 50 MG TABS tablet Take 50 mg by mouth 2 (two) times daily as needed (Take 1  tablet twice a day as needed).  04/12/13   [provider]  nystatin cream (MYCOSTATIN) Apply 1 application topically 2 (two) times daily. 07/10/17   Leone Haven, MD  omeprazole (PRILOSEC) 20 MG capsule TAKE ONE (1) CAPSULE EACH DAY 11/22/16   Coral Spikes, DO  oxybutynin (DITROPAN) 5 MG tablet TAKE ONE TABLET BY MOUTH EVERY DAY 09/15/17   Leone Haven, MD  predniSONE (DELTASONE) 20 MG tablet Take 1 tablet (20 mg total) by mouth daily with breakfast for 5 days. 11/03/17 11/08/17  Menshew, Dannielle Karvonen, PA-C  propranolol ER (INDERAL LA) 60 MG 24 hr capsule TAKE 1 CAPSULE BY MOUTH EVERY DAY 01/13/17   Thersa Salt G, DO  sertraline (ZOLOFT) 100 MG tablet TAKE ONE (1) TABLET EACH DAY 11/21/16   Cook, [place] G, DO  triamterene-hydrochlorothiazide (DYAZIDE) 37.5-25 MG capsule TAKE 1 CAPSULE BY MOUTH EVERY DAY 09/15/17   Leone Haven, MD  verapamil (CALAN-SR) 180 MG CR tablet Take 1 tablet (180 mg total) by mouth at bedtime. 07/10/17   Leone Haven, MD    Allergies Statins; Tramadol; Penicillins cross reactors; Sulfa antibiotics; and Tetracyclines & related  Family History  Problem Relation Age of Onset  . Stroke Father   . Heart disease Sister   . Cancer Brother        colon  . Heart disease Brother   . Cancer Sister        colon  . Heart attack Brother   . Heart attack Son   . Parkinson's disease Son     Social History Social History   Tobacco Use  . Smoking status: Never Smoker  . Smokeless tobacco: Never Used  Substance Use Topics  . Alcohol use: No  . Drug use: No     Review of Systems  Constitutional: No fever/chills Eyes: No visual changes. No discharge ENT: No upper respiratory complaints. Cardiovascular: no chest pain.  Positive for weakness. Respiratory: no cough. No SOB. Gastrointestinal: No abdominal pain.  No nausea, no vomiting.   Musculoskeletal: Positive for neck, left shoulder, left humerus, left hip, lower back pain Skin: Negative  for rash, abrasions, lacerations, ecchymosis. Neurological: Positive for headache but denies focal weakness or numbness. 10-point ROS otherwise negative.  ____________________________________________   PHYSICAL EXAM:  VITAL SIGNS: ED Triage Vitals  Enc Vitals Group     BP 11/03/17 1450 (!) 151/55     Pulse Rate 11/03/17 1450 77     Resp 11/03/17 1450 16     Temp 11/03/17 1450 98 F (36.7 C)     Temp Source 11/03/17 1450 Oral     SpO2 11/03/17 1450 96 %     Weight 11/03/17 1451 175 lb (79.4 kg)     Height 11/03/17 1451 5\' 3"  (1.6 m)     Head Circumference --  Peak Flow --      Pain Score 11/03/17 1451 6     Pain Loc --      Pain Edu? --      Excl. in [place]? --      Constitutional: Alert and oriented. Well appearing and in no acute distress. Eyes: Conjunctivae are normal. PERRL. EOMI. Head: Hematoma noted to the posterior occipital skull.  No bleeding or lacerations.  Area is tender to palpation with no crepitus.  No other tenderness to palpation or palpable abnormality to the skull or face.  No battle signs, raccoon eyes, serosanguineous fluid drainage from the ears or nares. ENT:      Ears:       Nose: No congestion/rhinnorhea.      Mouth/Throat: Mucous membranes are moist.  Neck: No stridor.  Diffuse midline cervical spine tenderness to palpation.  No palpable abnormality or step-off.  Radial pulse intact bilateral upper extremities.  Sensation intact and equal in all dermatomal distributions bilateral upper extremities.  Cardiovascular: Normal rate, regular rhythm. Normal S1 and S2.  Good peripheral circulation. Respiratory: Normal respiratory effort without tachypnea or retractions. Lungs CTAB. Good air entry to the bases with no decreased or absent breath sounds. Gastrointestinal: Bowel sounds 4 quadrants. Soft and nontender to palpation. No guarding or rigidity. No palpable masses. No distention. No CVA tenderness. Musculoskeletal: Full range of motion to all  extremities. No gross deformities appreciated.  Visualization of the left shoulder reveals no gross edema, erythema, ecchymosis, abrasions or laceration.  No deformity.  Limited range of motion due to pain.  Patient is tender to palpation along the left inferior scapular region with no palpable abnormality.  Mild tenderness to palpation over the lateral shoulder.  No palpable abnormality.  Full range of motion testing is not performed due to pain.  Examination of the elbow and wrist distally is unremarkable.  Radial pulse and sensation intact distally.  Examination of the lumbar spine reveals no gross deformity.  Diffuse tenderness to palpation in the lumbar spine with no specific point tenderness or palpable abnormality or step-off.  No tenderness to palpation over bilateral sciatic notches.  Patient is mildly tender to palpation over the left lateral hip over the greater trochanteric region with no palpable abnormality.  No other tenderness to palpation.  No tenderness to palpation in the groin.  Patient is able to flex, extend, internally and externally rotate the left hip.  No shortening or internal rotation of the left lower extremity is appreciated.  Examination of the knee and ankle is unremarkable.  Dorsalis pedis pulse intact bilateral lower extremity's.  Sensation intact and equal in all dermatomal distributions bilateral lower extremities. Neurologic:  Normal speech and language. No gross focal neurologic deficits are appreciated.  Skin:  Skin is warm, dry and intact. No rash noted. Psychiatric: Mood and affect are normal. Speech and behavior are normal. Patient exhibits appropriate insight and judgement.   ____________________________________________   LABS (all labs ordered are listed, but only abnormal results are displayed)  Labs Reviewed  COMPREHENSIVE METABOLIC PANEL - Abnormal; Notable for the following components:      Result Value   Sodium 130 (*)    Chloride 94 (*)    Glucose,  Bld 104 (*)    Creatinine, Ser 1.06 (*)    GFR calc non Af Amer 44 (*)    GFR calc Af Amer 50 (*)    All other components within normal limits  CBC WITH DIFFERENTIAL/PLATELET - Abnormal; Notable for the  following components:   WBC 11.6 (*)    Neutro Abs 8.0 (*)    Monocytes Absolute 1.0 (*)    All other components within normal limits   ____________________________________________  EKG  ED ECG REPORT I, Charline Bills Delanna Blacketer,  personally viewed and interpreted this ECG.   Date: 11/03/2017  EKG Time: 1647 hrs.  Rate: 70 bpm  Rhythm: normal sinus rhythm, 1st degree AV block, change from previous EKG with first-degree AV block, otherwise no changes  Axis: Normal axis  Intervals:first-degree A-V block   ST&T Change: No ST elevations or depressions noted  No ST elevations or depressions.  First-degree AV block, sinus rhythm.  ____________________________________________  RADIOLOGY I personally viewed and evaluated these images as part of my medical decision making, as well as reviewing the written report by the radiologist.  I concur with radiologist finding of cortical irregularity of the scapular region consistent with possible fracture.  Otherwise, no acute intracranial or osseous abnormality to the head, neck, pelvis or hip region.  Dg Lumbar Spine Complete  Result Date: 11/03/2017 CLINICAL DATA:  Initial evaluation for acute trauma, fall, lower back pain. EXAM: LUMBAR SPINE - COMPLETE 4+ VIEW COMPARISON:  Prior MRI from 10/28/2017. FINDINGS: Severe levoscoliosis, similar to previous. Grade 1 anterolisthesis of L4 on L5, also unchanged. Vertebral body heights are maintained with mild chronic height loss at the superior endplate of L3. Vertebral body heights otherwise grossly maintained without evidence for acute or interval fracture. Visualized sacrum intact. Moderate to advanced degenerative spondylolysis at L1-2 and L2-3, stable. SI joints approximated and symmetric. Visualized  soft tissues demonstrate no acute abnormality. Cholecystectomy clips noted. Aortic atherosclerosis. IMPRESSION: 1. No radiographic evidence for acute abnormality within the lumbar spine. 2. Severe levoscoliosis with multilevel degenerative spondylolysis, most notable at L1-2 and L2-3. Electronically Signed   By: Jeannine Boga M.D.   On: 11/03/2017 16:58   Ct Head Wo Contrast  Result Date: 11/03/2017 CLINICAL DATA:  Pain after fall EXAM: CT HEAD WITHOUT CONTRAST CT CERVICAL SPINE WITHOUT CONTRAST TECHNIQUE: Multidetector CT imaging of the head and cervical spine was performed following the standard protocol without intravenous contrast. Multiplanar CT image reconstructions of the cervical spine were also generated. COMPARISON:  None. FINDINGS: CT HEAD FINDINGS Brain: No subdural, epidural, or subarachnoid hemorrhage. Cerebellum, brainstem, and basal cisterns are normal. Ventricles and sulci are unremarkable for age with volume loss. Minimal white matter changes. No acute cortical ischemia or infarct. No mass effect or midline shift. There is a left-sided calcified meningioma adjacent to the left frontal lobe. Vascular: Calcified atherosclerosis in the intracranial carotids. Skull: Normal. Negative for fracture or focal lesion. Sinuses/Orbits: No acute finding. Other: None. CT CERVICAL SPINE FINDINGS Alignment: Minimal anterolisthesis of C4 versus C5, measuring 2 mm. No other malalignment. Skull base and vertebrae: No acute fracture. No primary bone lesion or focal pathologic process. Soft tissues and spinal canal: No prevertebral fluid or swelling. No visible canal hematoma. Disc levels: Mild degenerative disc disease with tiny anterior osteophytes. Moderate to severe facet degenerative changes throughout most of the cervical spine. Upper chest: Negative. Other: No other abnormalities. IMPRESSION: 1. No acute intracranial abnormalities. 2. Minimal anterolisthesis of C4 versus C5, measuring 2 mm, likely  degenerative. No fractures or traumatic malalignment identified. Multilevel degenerative changes, particularly in the facets. Electronically Signed   By: Dorise Bullion III M.D   On: 11/03/2017 16:37   Ct Chest W Contrast  Result Date: 11/03/2017 CLINICAL DATA:  Fall, chest trauma EXAM: CT CHEST WITH  CONTRAST TECHNIQUE: Multidetector CT imaging of the chest was performed during intravenous contrast administration. CONTRAST:  72mL OMNIPAQUE IOHEXOL 300 MG/ML  SOLN COMPARISON:  11/26/2015 FINDINGS: Cardiovascular: Heart size within normal limits. Coronary artery calcification. Atherosclerotic aortic arch. No aortic injury. No dissection or aneurysm. Mild pulmonary artery enlargement Mediastinum/Nodes: No mass or adenopathy Lungs/Pleura: Lungs are clear without infiltrate or effusion. No pneumothorax. Mild scarring. Subpleural blebs in the lung bases. Upper Abdomen: 2 cm ill-defined lesion in the inferior lobe of the liver on the right, indeterminate. No free fluid Musculoskeletal: No acute fracture. 10 mm calcified mass in the spinal canal on the left at T10-11. Possible meningioma. Degenerative change. IMPRESSION: No acute injury identified 2 cm liver lesion, indeterminate Probable 10 mm meningioma in the spinal canal at T10-11. Aortic Atherosclerosis (ICD10-I70.0). Electronically Signed   By: Franchot Gallo M.D.   On: 11/03/2017 19:05   Ct Cervical Spine Wo Contrast  Result Date: 11/03/2017 CLINICAL DATA:  Pain after fall EXAM: CT HEAD WITHOUT CONTRAST CT CERVICAL SPINE WITHOUT CONTRAST TECHNIQUE: Multidetector CT imaging of the head and cervical spine was performed following the standard protocol without intravenous contrast. Multiplanar CT image reconstructions of the cervical spine were also generated. COMPARISON:  None. FINDINGS: CT HEAD FINDINGS Brain: No subdural, epidural, or subarachnoid hemorrhage. Cerebellum, brainstem, and basal cisterns are normal. Ventricles and sulci are unremarkable for age  with volume loss. Minimal white matter changes. No acute cortical ischemia or infarct. No mass effect or midline shift. There is a left-sided calcified meningioma adjacent to the left frontal lobe. Vascular: Calcified atherosclerosis in the intracranial carotids. Skull: Normal. Negative for fracture or focal lesion. Sinuses/Orbits: No acute finding. Other: None. CT CERVICAL SPINE FINDINGS Alignment: Minimal anterolisthesis of C4 versus C5, measuring 2 mm. No other malalignment. Skull base and vertebrae: No acute fracture. No primary bone lesion or focal pathologic process. Soft tissues and spinal canal: No prevertebral fluid or swelling. No visible canal hematoma. Disc levels: Mild degenerative disc disease with tiny anterior osteophytes. Moderate to severe facet degenerative changes throughout most of the cervical spine. Upper chest: Negative. Other: No other abnormalities. IMPRESSION: 1. No acute intracranial abnormalities. 2. Minimal anterolisthesis of C4 versus C5, measuring 2 mm, likely degenerative. No fractures or traumatic malalignment identified. Multilevel degenerative changes, particularly in the facets. Electronically Signed   By: Dorise Bullion III M.D   On: 11/03/2017 16:37   Dg Shoulder Left  Result Date: 11/03/2017 CLINICAL DATA:  Initial evaluation for acute trauma, fall. EXAM: LEFT SHOULDER - 2+ VIEW COMPARISON:  None. FINDINGS: There is question of subtle cortical irregularity at the inferior aspect of the left scapular wing, which could reflect an acute nondisplaced fracture. No other acute fracture or dislocation about the shoulder. Humeral head in normal line with the glenoid. AC joint approximated. Osteopenia. Degenerative osteoarthritic changes noted about the acromioclavicular and glenohumeral joints. No acute soft tissue abnormality identified. Visualized left hemithorax grossly clear. IMPRESSION: 1. Question subtle cortical irregularity at the inferior aspect of the left scapular  wing, which could reflect an acute nondisplaced fracture. Correlation with physical exam for possible pain at this location recommended. 2. No other acute osseous abnormality about the shoulder. Electronically Signed   By: Jeannine Boga M.D.   On: 11/03/2017 16:52   Dg Hip Unilat W Or Wo Pelvis 2-3 Views Left  Result Date: 11/03/2017 CLINICAL DATA:  Initial evaluation for acute trauma, fall. EXAM: DG HIP (WITH OR WITHOUT PELVIS) 2-3V LEFT COMPARISON:  None. FINDINGS: No acute  fracture dislocation. Femoral heads in normal alignment within the acetabula. Femoral head heights maintained. Bony pelvis intact. Diffuse osteopenia. No acute soft tissue abnormality. Vascular calcifications noted within the upper left thigh. Extensive degenerative changes noted within the lower lumbar spine. IMPRESSION: No acute osseous abnormality about the left hip. Electronically Signed   By: Jeannine Boga M.D.   On: 11/03/2017 17:03    ____________________________________________    PROCEDURES  Procedure(s) performed:    Procedures    Medications  iohexol (OMNIPAQUE) 300 MG/ML solution 60 mL (60 mLs Intravenous Contrast Given 11/03/17 1836)  dexamethasone (DECADRON) injection 10 mg (10 mg Intravenous Given 11/03/17 2041)     ____________________________________________   INITIAL IMPRESSION / ASSESSMENT AND PLAN / ED COURSE  Pertinent labs & imaging results that were available during my care of the patient were reviewed by me and considered in my medical decision making (see chart for details).  Review of the  [place]RS was performed in accordance of the [place] prior to dispensing any controlled drugs.      Patient's diagnosis is consistent with fall resulting in scalp contusion, headache, multiple contusions.  Patient presented to the emergency department after being dizzy, stepping backwards falling striking her head.  She reported with headache, hematoma to the head, neck pain, shoulder pain,  lower back pain, left hip pain.  Imaging returned with findings consistent with scapular fracture to the left scapula.  This is consistent with physical exam.  CT scan is ordered for further evaluation.  Labs returned with reassuring results.  Awaiting CT at this time.  Patient care is transferred to [facility], PA-C.  Final disposition and diagnosis will be provided by this provider at this time.    ____________________________________________  FINAL CLINICAL IMPRESSION(S) / ED DIAGNOSES  Final diagnoses:  Fall in home, initial encounter  Contusion of scalp, initial encounter  Muscular pain      NEW MEDICATIONS STARTED DURING THIS VISIT:  ED Discharge Orders        Ordered    predniSONE (DELTASONE) 20 MG tablet  Daily with breakfast     11/03/17 2019          This chart was dictated using voice recognition software/Dragon. Despite best efforts to proofread, errors can occur which can change the meaning. Any change was purely unintentional.    Darletta Moll, PA-C 11/03/17 2324    Merlyn Lot, MD 11/03/17 2330

## 2017-11-03 NOTE — Progress Notes (Signed)
Patient escorted to [facility] with assistance from this RN and primary RN, Remo Lipps.  Patient's daughter concerned about patient going home.  Patient endorsed having a rollator walker and raised BSC over toilet, stated these are sturdy.  Patient tolerated transferring well as a standby assist.  Remains sitting in WC, awaiting pain meds and discharge.

## 2017-11-03 NOTE — ED Provider Notes (Signed)
ED ECG REPORT I, Darel Hong, the attending physician, personally viewed and interpreted this ECG.  Date: 11/03/2017 EKG Time:  Rate: 70 Rhythm: normal sinus rhythm QRS Axis: Leftward axis Intervals: First-degree AV block ST/T Wave abnormalities: normal Narrative Interpretation: no evidence of acute ischemia    Darel Hong, MD 11/03/17 1651

## 2017-11-03 NOTE — Telephone Encounter (Signed)
I see she is in ED  Please keep wound clean, dry. Open to air.  Apply bactroban TID as prescribed.  Let me know of any increased redness, any swelling or increased pain.  Monitor for fever  F.u pcp if doesn't resolve

## 2017-11-03 NOTE — Telephone Encounter (Addendum)
Spoke with Sharee Pimple patient care [place] will fax notes. Notes faxed .  How to you want them to address wound?  FYI patient is in ED she fell and hit head.

## 2017-11-03 NOTE — ED Notes (Signed)
First Nurse Note: Pt to ED via [facility] c/o fall. Pt lost balance and fell hitting the back of her head. Pt is c/o pain to the back of her head, and the left shoulder and left side of back. Pt denies LOC

## 2017-11-03 NOTE — ED Triage Notes (Signed)
Pt states that she dropped a dish cloth in the kitchen and attempted to pick it up and fell backwards after she lost her balance. Pt is c/o left shoulder pain, pt states that her neck is hurting but was hurting prior to the fall as well, pt also states that she is having some pain left leg pain, pt denies loc

## 2017-11-03 NOTE — Discharge Instructions (Addendum)
Your exam, x-rays, and CT scan are essentially normal and negative for any acute fracture or brain injury. Take you home pain medicine along with the prednisone. Apply ice or moist heat to any sore muscles. Follow-up with Dr. Caryl Bis for ongoing symptoms.

## 2017-11-03 NOTE — Telephone Encounter (Signed)
Copied from [place] [id number]. Topic: General - Other >> Nov 03, 2017  3:02 PM Yvette Rack wrote: Reason for CRM: Estill Bamberg with [place], [place] states a request to change the Rx from a cream to an ointment was submitted on 11/01/17 and they have not received a response. Estill Bamberg is requesting a call back. Cb# [phone]

## 2017-11-03 NOTE — Telephone Encounter (Signed)
Please advise, patient saw Mable Paris

## 2017-11-03 NOTE — Telephone Encounter (Signed)
Patient saw Mable Paris

## 2017-11-06 ENCOUNTER — Ambulatory Visit: Payer: Medicare Other | Admitting: Podiatry

## 2017-11-07 DIAGNOSIS — I739 Peripheral vascular disease, unspecified: Secondary | ICD-10-CM | POA: Diagnosis not present

## 2017-11-07 DIAGNOSIS — I110 Hypertensive heart disease with heart failure: Secondary | ICD-10-CM | POA: Diagnosis not present

## 2017-11-07 DIAGNOSIS — J449 Chronic obstructive pulmonary disease, unspecified: Secondary | ICD-10-CM | POA: Diagnosis not present

## 2017-11-07 DIAGNOSIS — I509 Heart failure, unspecified: Secondary | ICD-10-CM | POA: Diagnosis not present

## 2017-11-07 DIAGNOSIS — R238 Other skin changes: Secondary | ICD-10-CM | POA: Diagnosis not present

## 2017-11-07 DIAGNOSIS — M5116 Intervertebral disc disorders with radiculopathy, lumbar region: Secondary | ICD-10-CM | POA: Diagnosis not present

## 2017-11-13 ENCOUNTER — Telehealth: Payer: Self-pay | Admitting: Family Medicine

## 2017-11-13 ENCOUNTER — Other Ambulatory Visit: Payer: Self-pay | Admitting: Family Medicine

## 2017-11-13 DIAGNOSIS — J449 Chronic obstructive pulmonary disease, unspecified: Secondary | ICD-10-CM | POA: Diagnosis not present

## 2017-11-13 DIAGNOSIS — I110 Hypertensive heart disease with heart failure: Secondary | ICD-10-CM | POA: Diagnosis not present

## 2017-11-13 DIAGNOSIS — M5116 Intervertebral disc disorders with radiculopathy, lumbar region: Secondary | ICD-10-CM | POA: Diagnosis not present

## 2017-11-13 DIAGNOSIS — I509 Heart failure, unspecified: Secondary | ICD-10-CM | POA: Diagnosis not present

## 2017-11-13 DIAGNOSIS — I739 Peripheral vascular disease, unspecified: Secondary | ICD-10-CM | POA: Diagnosis not present

## 2017-11-13 DIAGNOSIS — R238 Other skin changes: Secondary | ICD-10-CM | POA: Diagnosis not present

## 2017-11-13 NOTE — Telephone Encounter (Signed)
Copied from [place] [id number]. Topic: Inquiry >> Nov 13, 2017 11:07 AM Lennox Solders wrote: Reason for ADL:[id number] rn adv home care would like a order for PT for safety

## 2017-11-13 NOTE — Telephone Encounter (Signed)
Please advise 

## 2017-11-13 NOTE — Telephone Encounter (Signed)
[name] would also like orders for physical therapy for safety due to patients fall last week.

## 2017-11-13 NOTE — Telephone Encounter (Signed)
It is okay to give verbal orders for physical therapy.  Please check to see that the patient had no injuries from her falls.

## 2017-11-13 NOTE — Telephone Encounter (Signed)
Last Ov 09/05/17 last filled by Dr.Cook Gabapentin 11/22/16 90 3rf Sertraline 11/21/16 90 3rf Omeprazole 11/22/16 90 3rf

## 2017-11-13 NOTE — Telephone Encounter (Signed)
Copied from [place] [id number]. Topic: Inquiry >> Nov 13, 2017 11:09 AM Lennox Solders wrote: Reason for CRM: Cindy Graham  adv home care is calling . Pt had 3 day of cipro for uti. Pt finished on 10-25-17. Pt is still having symptoms. Pt was incontinence of urine during the night this past Friday which is abnormal . Pt is still having urinary frequency and general malaise . [name] would like to obtain a clean catch for urinalysis C/S to be sure patient uti has been resolved

## 2017-11-13 NOTE — Telephone Encounter (Signed)
She needs to be evaluated if she has had continued symptoms despite treatment. Please inform them of this. Thanks.

## 2017-11-13 NOTE — Telephone Encounter (Signed)
Patient notified and scheduled 

## 2017-11-14 ENCOUNTER — Ambulatory Visit (INDEPENDENT_AMBULATORY_CARE_PROVIDER_SITE_OTHER): Payer: Medicare Other | Admitting: Internal Medicine

## 2017-11-14 ENCOUNTER — Encounter: Payer: Self-pay | Admitting: Internal Medicine

## 2017-11-14 VITALS — BP 138/62 | HR 75 | Temp 98.3°F | Ht 63.0 in | Wt 175.2 lb

## 2017-11-14 DIAGNOSIS — R269 Unspecified abnormalities of gait and mobility: Secondary | ICD-10-CM | POA: Diagnosis not present

## 2017-11-14 DIAGNOSIS — Z789 Other specified health status: Secondary | ICD-10-CM

## 2017-11-14 DIAGNOSIS — R300 Dysuria: Secondary | ICD-10-CM

## 2017-11-14 DIAGNOSIS — E871 Hypo-osmolality and hyponatremia: Secondary | ICD-10-CM | POA: Diagnosis not present

## 2017-11-14 DIAGNOSIS — D72829 Elevated white blood cell count, unspecified: Secondary | ICD-10-CM

## 2017-11-14 DIAGNOSIS — Z7409 Other reduced mobility: Secondary | ICD-10-CM | POA: Diagnosis not present

## 2017-11-14 DIAGNOSIS — N3000 Acute cystitis without hematuria: Secondary | ICD-10-CM | POA: Diagnosis not present

## 2017-11-14 LAB — CBC WITH DIFFERENTIAL/PLATELET
BASOS ABS: 0.1 10*3/uL (ref 0.0–0.1)
BASOS PCT: 0.8 % (ref 0.0–3.0)
EOS ABS: 0.3 10*3/uL (ref 0.0–0.7)
Eosinophils Relative: 3.7 % (ref 0.0–5.0)
HEMATOCRIT: 36.3 % (ref 36.0–46.0)
HEMOGLOBIN: 12 g/dL (ref 12.0–15.0)
LYMPHS PCT: 29.1 % (ref 12.0–46.0)
Lymphs Abs: 2.4 10*3/uL (ref 0.7–4.0)
MCHC: 33.1 g/dL (ref 30.0–36.0)
MCV: 96 fl (ref 78.0–100.0)
MONO ABS: 1 10*3/uL (ref 0.1–1.0)
Monocytes Relative: 11.9 % (ref 3.0–12.0)
Neutro Abs: 4.5 10*3/uL (ref 1.4–7.7)
Neutrophils Relative %: 54.5 % (ref 43.0–77.0)
Platelets: 319 10*3/uL (ref 150.0–400.0)
RBC: 3.79 Mil/uL — ABNORMAL LOW (ref 3.87–5.11)
RDW: 14.5 % (ref 11.5–15.5)
WBC: 8.2 10*3/uL (ref 4.0–10.5)

## 2017-11-14 LAB — BASIC METABOLIC PANEL
BUN: 17 mg/dL (ref 6–23)
CALCIUM: 9.1 mg/dL (ref 8.4–10.5)
CHLORIDE: 94 meq/L — AB (ref 96–112)
CO2: 28 mEq/L (ref 19–32)
CREATININE: 1.27 mg/dL — AB (ref 0.40–1.20)
GFR: 41.62 mL/min — AB (ref >60.00)
Glucose, Bld: 113 mg/dL — ABNORMAL HIGH (ref 70–99)
Potassium: 4.2 mEq/L (ref 3.5–5.1)
Sodium: 130 mEq/L — ABNORMAL LOW (ref 135–145)

## 2017-11-14 NOTE — Progress Notes (Signed)
Chief Complaint  Patient presents with  . Urinary Tract Infection   F/u with daughte r 1. UTI had 10/23/17 E coli completed cipro 250 bid x 3 days and still with increased freq and reports bed wetting not like her she is wearing pull ups and pads no dysuria.   2. Reduced adls and iadls unable to cook, clean bath alone but able to put on clothes with help she feel 11/03/17 trying to clean and daughter concerned they are considering facility short tem. Reviewed imaging 11/03/17 left scapula non displaced fracture possible.    Review of Systems  Constitutional: Negative for weight loss.  Genitourinary: Positive for frequency. Negative for dysuria.  Musculoskeletal: Positive for falls.  Neurological:       -reduced adls    Past Medical History:  Diagnosis Date  . Arthritis    osteo  . COPD (chronic obstructive pulmonary disease) ([place])   . GERD (gastroesophageal reflux disease)   . Heart murmur   . Hernia   . Hyperlipidemia   . Hypertension   . Melanoma ([place])    face  . Syncope and collapse   . Urine incontinence    Past Surgical History:  Procedure Laterality Date  . ABDOMINAL HYSTERECTOMY    . CHOLECYSTECTOMY    . HERNIA REPAIR     Family History  Problem Relation Age of Onset  . Stroke Father   . Heart disease Sister   . Cancer Brother        colon  . Heart disease Brother   . Cancer Sister        colon  . Heart attack Brother   . Heart attack Son   . Parkinson's disease Son    Social History   Socioeconomic History  . Marital status: Married    Spouse name: Not on file  . Number of children: Not on file  . Years of education: Not on file  . Highest education level: Not on file  Occupational History  . Not on file  Social Needs  . Financial resource strain: Not on file  . Food insecurity:    Worry: Not on file    Inability: Not on file  . Transportation needs:    Medical: Not on file    Non-medical: Not on file  Tobacco Use  . Smoking status: Never  Smoker  . Smokeless tobacco: Never Used  Substance and Sexual Activity  . Alcohol use: No  . Drug use: No  . Sexual activity: Not Currently  Lifestyle  . Physical activity:    Days per week: Not on file    Minutes per session: Not on file  . Stress: Not on file  Relationships  . Social connections:    Talks on phone: Not on file    Gets together: Not on file    Attends religious service: Not on file    Active member of club or organization: Not on file    Attends meetings of clubs or organizations: Not on file    Relationship status: Not on file  . Intimate partner violence:    Fear of current or ex partner: Not on file    Emotionally abused: Not on file    Physically abused: Not on file    Forced sexual activity: Not on file  Other Topics Concern  . Not on file  Social History Narrative  . Not on file   Current Meds  Medication Sig  . aspirin 81 MG tablet Take 81  mg by mouth daily.    . Biotin 1000 MCG tablet Take 1,000 mcg by mouth daily.    . cholecalciferol (VITAMIN D) 400 UNITS TABS Take 400 Units by mouth 2 (two) times daily.    . enalapril (VASOTEC) 20 MG tablet TAKE ONE TABLET BY MOUTH EVERY DAY  . Flaxseed, Linseed, (FLAX SEED OIL) 1000 MG CAPS Take 1 capsule by mouth daily.    . fluticasone (FLOVENT HFA) 220 MCG/ACT inhaler Inhale 2 puffs into the lungs 2 (two) times daily.  Marland Kitchen gabapentin (NEURONTIN) 300 MG capsule TAKE ONE (1) CAPSULE EACH DAY  . lactulose (CHRONULAC) 10 GM/15ML solution Take 30 mLs (20 g total) by mouth daily as needed for mild constipation.  . Multiple Vitamin (MULTIVITAMIN) capsule Take 1 capsule by mouth daily.    . mupirocin cream (BACTROBAN) 2 % Apply 1 application topically 3 (three) times daily.  . nitroGLYCERIN (NITROSTAT) 0.3 MG SL tablet DISSOLVE 1 TABLET UNDER TONGUE EVERY 5 MINUTES FOR 3 DOSES AS NEEDED FOR CHEST PAIN. IF NO RELIEF GO TO ER.  . NUCYNTA 50 MG TABS tablet Take 50 mg by mouth 2 (two) times daily as needed (Take 1 tablet  twice a day as needed).   . nystatin cream (MYCOSTATIN) Apply 1 application topically 2 (two) times daily.  Marland Kitchen omeprazole (PRILOSEC) 20 MG capsule TAKE ONE (1) CAPSULE EACH DAY  . oxybutynin (DITROPAN) 5 MG tablet TAKE ONE TABLET BY MOUTH EVERY DAY  . propranolol ER (INDERAL LA) 60 MG 24 hr capsule TAKE 1 CAPSULE BY MOUTH EVERY DAY  . sertraline (ZOLOFT) 100 MG tablet TAKE ONE (1) TABLET EACH DAY  . triamterene-hydrochlorothiazide (DYAZIDE) 37.5-25 MG capsule TAKE 1 CAPSULE BY MOUTH EVERY DAY  . verapamil (CALAN-SR) 180 MG CR tablet Take 1 tablet (180 mg total) by mouth at bedtime.   Allergies  Allergen Reactions  . Statins Other (See Comments)    Very bad muscle aches  . Tramadol Itching  . Neomycin-Polymyxin-Gramicidin Other (See Comments)  . Penicillins Cross Reactors   . Sulfa Antibiotics   . Tetracyclines & Related    Recent Results (from the past 2160 hour(s))  CBC     Status: None   Collection Time: 09/05/17  3:52 PM  Result Value Ref Range   WBC 7.9 4.0 - 10.5 K/uL   RBC 4.14 3.87 - 5.11 Mil/uL   Platelets 279.0 150.0 - 400.0 K/uL   Hemoglobin 13.1 12.0 - 15.0 g/dL   HCT 39.7 36.0 - 46.0 %   MCV 95.9 78.0 - 100.0 fl   MCHC 32.8 30.0 - 36.0 g/dL   RDW 14.8 11.5 - 15.5 %  B12     Status: Abnormal   Collection Time: 09/05/17  3:52 PM  Result Value Ref Range   Vitamin B-12 >1500 (H) 211 - 911 pg/mL  Comp Met (CMET)     Status: Abnormal   Collection Time: 09/05/17  3:52 PM  Result Value Ref Range   Sodium 134 (L) 135 - 145 mEq/L   Potassium 4.5 3.5 - 5.1 mEq/L   Chloride 97 96 - 112 mEq/L   CO2 26 19 - 32 mEq/L   Glucose, Bld 95 70 - 99 mg/dL   BUN 25 (H) 6 - 23 mg/dL   Creatinine, Ser 1.34 (H) 0.40 - 1.20 mg/dL   Total Bilirubin 0.4 0.2 - 1.2 mg/dL   Alkaline Phosphatase 63 39 - 117 U/L   AST 23 0 - 37 U/L   ALT 13 0 -  35 U/L   Total Protein 6.9 6.0 - 8.3 g/dL   Albumin 3.7 3.5 - 5.2 g/dL   Calcium 9.7 8.4 - 10.5 mg/dL   GFR 39.14 (L) >60.00 mL/min  HgB A1c      Status: None   Collection Time: 09/05/17  3:52 PM  Result Value Ref Range   Hgb A1c MFr Bld 6.0 4.6 - 6.5 %    Comment: Glycemic Control Guidelines for People with Diabetes:Non Diabetic:  <6%Goal of Therapy: <7%Additional Action Suggested:  >9%   Basic Metabolic Panel (BMET)     Status: Abnormal   Collection Time: 09/14/17  3:37 PM  Result Value Ref Range   Sodium 138 135 - 145 mEq/L   Potassium 4.6 3.5 - 5.1 mEq/L   Chloride 102 96 - 112 mEq/L   CO2 27 19 - 32 mEq/L   Glucose, Bld 94 70 - 99 mg/dL   BUN 23 6 - 23 mg/dL   Creatinine, Ser 1.20 0.40 - 1.20 mg/dL   Calcium 9.5 8.4 - 10.5 mg/dL   GFR 44.45 (L) >60.00 mL/min  Comprehensive metabolic panel     Status: Abnormal   Collection Time: 11/03/17  5:44 PM  Result Value Ref Range   Sodium 130 (L) 135 - 145 mmol/L   Potassium 4.6 3.5 - 5.1 mmol/L   Chloride 94 (L) 101 - 111 mmol/L   CO2 25 22 - 32 mmol/L   Glucose, Bld 104 (H) 65 - 99 mg/dL   BUN 19 6 - 20 mg/dL   Creatinine, Ser 1.06 (H) 0.44 - 1.00 mg/dL   Calcium 9.2 8.9 - 10.3 mg/dL   Total Protein 6.8 6.5 - 8.1 g/dL   Albumin 3.5 3.5 - 5.0 g/dL   AST 32 15 - 41 U/L   ALT 15 14 - 54 U/L   Alkaline Phosphatase 66 38 - 126 U/L   Total Bilirubin 0.8 0.3 - 1.2 mg/dL   GFR calc non Af Amer 44 (L) >60 mL/min   GFR calc Af Amer 50 (L) >60 mL/min    Comment: (NOTE) The eGFR has been calculated using the CKD EPI equation. This calculation has not been validated in all clinical situations. eGFR's persistently <60 mL/min signify possible Chronic Kidney Disease.    Anion gap 11 5 - 15    Comment: Performed at [facility], [address] [place], [place] [address]  CBC with Differential     Status: Abnormal   Collection Time: 11/03/17  5:44 PM  Result Value Ref Range   WBC 11.6 (H) 3.6 - 11.0 K/uL   RBC 3.99 3.80 - 5.20 MIL/uL   Hemoglobin 12.8 12.0 - 16.0 g/dL   HCT 38.3 35.0 - 47.0 %   MCV 96.0 80.0 - 100.0 fL   MCH 32.1 26.0 - 34.0 pg   MCHC 33.4 32.0 - 36.0  g/dL   RDW 14.5 11.5 - 14.5 %   Platelets 265 150 - 440 K/uL   Neutrophils Relative % 68 %   Neutro Abs 8.0 (H) 1.4 - 6.5 K/uL   Lymphocytes Relative 19 %   Lymphs Abs 2.2 1.0 - 3.6 K/uL   Monocytes Relative 9 %   Monocytes Absolute 1.0 (H) 0.2 - 0.9 K/uL   Eosinophils Relative 3 %   Eosinophils Absolute 0.3 0 - 0.7 K/uL   Basophils Relative 1 %   Basophils Absolute 0.1 0 - 0.1 K/uL    Comment: Performed at [facility], [address] [place],  [place] [address]   Objective  Body mass index is 31.04 kg/m. Wt Readings from Last 3 Encounters:  11/14/17 175 lb 3.2 oz (79.5 kg)  11/03/17 175 lb (79.4 kg)  11/01/17 175 lb 8 oz (79.6 kg)   Temp Readings from Last 3 Encounters:  11/14/17 98.3 F (36.8 C) (Oral)  11/03/17 98.1 F (36.7 C) (Oral)  11/01/17 98 F (36.7 C) (Oral)   BP Readings from Last 3 Encounters:  11/14/17 138/62  11/03/17 (!) 160/58  11/01/17 138/68   Pulse Readings from Last 3 Encounters:  11/14/17 75  11/03/17 75  11/01/17 61    Physical Exam  Constitutional: She is oriented to person, place, and time. Vital signs are normal. She appears well-developed and well-nourished. She is cooperative.  HENT:  Head: Normocephalic and atraumatic.  Mouth/Throat: Oropharynx is clear and moist and mucous membranes are normal.  Cardiovascular: Normal rate, regular rhythm and normal heart sounds.  Pulmonary/Chest: Effort normal and breath sounds normal.  Abdominal: There is no tenderness.  Neurological: She is alert and oriented to person, place, and time. Gait normal.  Rolling walker with seat   Skin: Skin is warm, dry and intact. Bruising noted.     Psychiatric: She has a normal mood and affect. Her speech is normal and behavior is normal. Judgment and thought content normal. Cognition and memory are normal.  Nursing note and vitals reviewed.   Assessment   1. UTI 10/23/17 E coli  2. Fall and reduced adls and iadls  3. Hyponatremia and  leukocytosis Plan  1. UA and culture today completed cipro 250 bid x 3 days Hold abx for now  2. Pt to bring in fl2 to next visit for snf placement  3. Check BMET,and cbc   Provider: Dr. Olivia Mackie McLean-Scocuzza-Internal Medicine

## 2017-11-14 NOTE — Patient Instructions (Addendum)
Bring FL2 form to next visit with Dr. Caryl Bis to consider skilled nursing facility  F/u in 2-3 weeks   Fall Prevention in the [place] can cause injuries. They can happen to people of all ages. There are many things you can do to make your home safe and to help prevent falls. What can I do on the outside of my home?  Regularly fix the edges of walkways and driveways and fix any cracks.  Remove anything that might make you trip as you walk through a door, such as a raised step or threshold.  Trim any bushes or trees on the path to your home.  Use bright outdoor lighting.  Clear any walking paths of anything that might make someone trip, such as rocks or tools.  Regularly check to see if handrails are loose or broken. Make sure that both sides of any steps have handrails.  Any raised decks and porches should have guardrails on the edges.  Have any leaves, snow, or ice cleared regularly.  Use sand or salt on walking paths during winter.  Clean up any spills in your garage right away. This includes oil or grease spills. What can I do in the bathroom?  Use night lights.  Install grab bars by the toilet and in the tub and shower. Do not use towel bars as grab bars.  Use non-skid mats or decals in the tub or shower.  If you need to sit down in the shower, use a plastic, non-slip stool.  Keep the floor dry. Clean up any water that spills on the floor as soon as it happens.  Remove soap buildup in the tub or shower regularly.  Attach bath mats securely with double-sided non-slip rug tape.  Do not have throw rugs and other things on the floor that can make you trip. What can I do in the bedroom?  Use night lights.  Make sure that you have a light by your bed that is easy to reach.  Do not use any sheets or blankets that are too big for your bed. They should not hang down onto the floor.  Have a firm chair that has side arms. You can use this for support while you get  dressed.  Do not have throw rugs and other things on the floor that can make you trip. What can I do in the kitchen?  Clean up any spills right away.  Avoid walking on wet floors.  Keep items that you use a lot in easy-to-reach places.  If you need to reach something above you, use a strong step stool that has a grab bar.  Keep electrical cords out of the way.  Do not use floor polish or wax that makes floors slippery. If you must use wax, use non-skid floor wax.  Do not have throw rugs and other things on the floor that can make you trip. What can I do with my stairs?  Do not leave any items on the stairs.  Make sure that there are handrails on both sides of the stairs and use them. Fix handrails that are broken or loose. Make sure that handrails are as long as the stairways.  Check any carpeting to make sure that it is firmly attached to the stairs. Fix any carpet that is loose or worn.  Avoid having throw rugs at the top or bottom of the stairs. If you do have throw rugs, attach them to the floor with carpet tape.  Make sure  that you have a light switch at the top of the stairs and the bottom of the stairs. If you do not have them, ask someone to add them for you. What else can I do to help prevent falls?  Wear shoes that: ? Do not have high heels. ? Have rubber bottoms. ? Are comfortable and fit you well. ? Are closed at the toe. Do not wear sandals.  If you use a stepladder: ? Make sure that it is fully opened. Do not climb a closed stepladder. ? Make sure that both sides of the stepladder are locked into place. ? Ask someone to hold it for you, if possible.  Clearly mark and make sure that you can see: ? Any grab bars or handrails. ? First and last steps. ? Where the edge of each step is.  Use tools that help you move around (mobility aids) if they are needed. These include: ? Canes. ? Walkers. ? Scooters. ? Crutches.  Turn on the lights when you go into a  dark area. Replace any light bulbs as soon as they burn out.  Set up your furniture so you have a clear path. Avoid moving your furniture around.  If any of your floors are uneven, fix them.  If there are any pets around you, be aware of where they are.  Review your medicines with your doctor. Some medicines can make you feel dizzy. This can increase your chance of falling. Ask your doctor what other things that you can do to help prevent falls. This information is not intended to replace advice given to you by your health care provider. Make sure you discuss any questions you have with your health care provider. Document Released: 03/05/2009 Document Revised: 10/15/2015 Document Reviewed: 06/13/2014 Elsevier Interactive Patient Education  [employer].

## 2017-11-14 NOTE — Telephone Encounter (Signed)
It looks like Joycelyn Schmid filled this on 11-01-17. Please advise the pharmacy wants to know if something cheaper could be sent in.

## 2017-11-14 NOTE — Progress Notes (Signed)
Pre visit review using our clinic review tool, if applicable. No additional management support is needed unless otherwise documented below in the visit note. 

## 2017-11-14 NOTE — Telephone Encounter (Signed)
Verbal order given. Patient was evaluated in ER for fall

## 2017-11-15 LAB — MICROSCOPIC EXAMINATION
CASTS: NONE SEEN /LPF
WBC, UA: >30 /hpf — AB (ref 0–5)

## 2017-11-15 LAB — URINALYSIS, ROUTINE W REFLEX MICROSCOPIC
Bilirubin, UA: NEGATIVE
Glucose, UA: NEGATIVE
Ketones, UA: NEGATIVE
Nitrite, UA: NEGATIVE
PH UA: 7 (ref 5.0–7.5)
Protein, UA: NEGATIVE
RBC, UA: NEGATIVE
Specific Gravity, UA: 1.012 (ref 1.005–1.030)
Urobilinogen, Ur: 0.2 mg/dL (ref 0.2–1.0)

## 2017-11-15 NOTE — Telephone Encounter (Signed)
This was addressed  in [place]

## 2017-11-15 NOTE — Telephone Encounter (Signed)
Caller name: Bri Relationship: pharmacist  Pharmacy: [place], [place] - [address] [phone] (Phone) [phone] (Fax)    Reason for call:  Pharmacist checking on the status of medication request, informed pharmacy please allow 72 hour turn around time, request was sent in on 11/13/17, please advise

## 2017-11-15 NOTE — Telephone Encounter (Signed)
Spoke to pharmacy and medication was covered and patient has already picked up medication

## 2017-11-15 NOTE — Telephone Encounter (Signed)
Call pharmacy  Ask what they would recommend based on patient's insurance?   If it better to go with mupirocin ointment? What is making it expensive?

## 2017-11-16 ENCOUNTER — Telehealth: Payer: Self-pay

## 2017-11-16 ENCOUNTER — Telehealth: Payer: Self-pay | Admitting: Family Medicine

## 2017-11-16 NOTE — Telephone Encounter (Signed)
Copied from [place] [id number]. Topic: General - Other >> Nov 16, 2017  3:46 PM Valla Leaver wrote: Reason for CRM: Raquel Sarna, PT with [facility]  calling to notify Dr. Caryl Bis that there was a break in physical therapy that will remain until Monday July 1st. Eval will take place Monday.

## 2017-11-16 NOTE — Telephone Encounter (Signed)
Daughter dropped off FL2 paper work to be completed. Paper work is up front in [employer]

## 2017-11-16 NOTE — Telephone Encounter (Signed)
Placed in red folder  

## 2017-11-16 NOTE — Telephone Encounter (Signed)
fyi

## 2017-11-16 NOTE — Telephone Encounter (Signed)
Noted  

## 2017-11-17 ENCOUNTER — Telehealth: Payer: Self-pay | Admitting: Family Medicine

## 2017-11-17 NOTE — Telephone Encounter (Signed)
Please advise 

## 2017-11-17 NOTE — Telephone Encounter (Signed)
Patient was informed of Prelim results.  Patient understood and no questions, comments, or concerns at this time.

## 2017-11-17 NOTE — Telephone Encounter (Signed)
Copied from [place] [id number]. Topic: Quick Communication - See Telephone Encounter >> Nov 17, 2017  3:59 PM Bea Graff, NT wrote: CRM for notification. See Telephone encounter for: 11/17/17. Pt would like a call with her lab and urine results.

## 2017-11-18 LAB — URINE CULTURE

## 2017-11-19 ENCOUNTER — Other Ambulatory Visit: Payer: Self-pay | Admitting: Internal Medicine

## 2017-11-19 DIAGNOSIS — N3000 Acute cystitis without hematuria: Secondary | ICD-10-CM

## 2017-11-19 MED ORDER — LEVOFLOXACIN 250 MG PO TABS: 250.0000 mg | ORAL_TABLET | Freq: Every day | ORAL | 0 refills | Status: DC

## 2017-11-20 DIAGNOSIS — I110 Hypertensive heart disease with heart failure: Secondary | ICD-10-CM | POA: Diagnosis not present

## 2017-11-20 DIAGNOSIS — I509 Heart failure, unspecified: Secondary | ICD-10-CM | POA: Diagnosis not present

## 2017-11-20 DIAGNOSIS — J449 Chronic obstructive pulmonary disease, unspecified: Secondary | ICD-10-CM | POA: Diagnosis not present

## 2017-11-20 DIAGNOSIS — I739 Peripheral vascular disease, unspecified: Secondary | ICD-10-CM | POA: Diagnosis not present

## 2017-11-20 DIAGNOSIS — M5116 Intervertebral disc disorders with radiculopathy, lumbar region: Secondary | ICD-10-CM | POA: Diagnosis not present

## 2017-11-20 DIAGNOSIS — R238 Other skin changes: Secondary | ICD-10-CM | POA: Diagnosis not present

## 2017-11-21 ENCOUNTER — Ambulatory Visit: Payer: Medicare Other

## 2017-11-21 ENCOUNTER — Ambulatory Visit (INDEPENDENT_AMBULATORY_CARE_PROVIDER_SITE_OTHER): Payer: Medicare Other | Admitting: Family Medicine

## 2017-11-21 ENCOUNTER — Telehealth: Payer: Self-pay | Admitting: Family Medicine

## 2017-11-21 ENCOUNTER — Encounter: Payer: Self-pay | Admitting: Family Medicine

## 2017-11-21 VITALS — BP 150/62 | HR 67 | Temp 98.3°F | Wt 175.2 lb

## 2017-11-21 DIAGNOSIS — Z111 Encounter for screening for respiratory tuberculosis: Secondary | ICD-10-CM | POA: Diagnosis not present

## 2017-11-21 DIAGNOSIS — N3090 Cystitis, unspecified without hematuria: Secondary | ICD-10-CM | POA: Diagnosis not present

## 2017-11-21 DIAGNOSIS — R34 Anuria and oliguria: Secondary | ICD-10-CM

## 2017-11-21 NOTE — Telephone Encounter (Signed)
Called and gave verbal orders for PT to Cherly Anderson at [facility].

## 2017-11-21 NOTE — Telephone Encounter (Signed)
Copied from [place] [id number]. Topic: General - Other >> Nov 21, 2017  9:59 AM Yvette Rack wrote: Reason for CRM: Cherly Anderson with [facility] request verbal orders for physical therapy for 2 times a week for 2 weeks and 1 time a week for 2 weeks. Cb# [phone]

## 2017-11-21 NOTE — Progress Notes (Signed)
Patient ID: Cindy Graham, female   DOB: [birth year], 84 y.o.   MRN: [id number]    PCP: Leone Haven, MD  Subjective:  Cindy Graham is a 84 y.o. year old very pleasant female patient who presents with Urinary Tract symptoms: symptoms including dysuria,  nocturia, and urgency. She reported one episode of hesitancy however she also has had wet pull ups/pads. She reports that she has only changed her pad once today which is less than usual which is why she is seeking care today. -started:over one week ago , symptoms are not worsening; started antibiotic yesterday  -previous treatments: Nothing other than Levaquin. Two doses have been taken at this time. She reports drinking water but cannot quantify how much water she is drinking and she reports voiding prior to this appointment but states that volume is decreased. History of UTI on 10/23/17;  E. Coli; where she completed cipro 250 mg bid x 3 days with increasing frequency and bed wetting. On 11/14/17 she was evaluated for dysuria and her culture was positive on enterococcus and she was prescribed Levaquin 250 mg daily with food X 1 week. She has taken two tablets at this time as this was started one day ago. She denies adverse effects at this time from [place].  ROS-denies fever, chills, sweats, N/V, flank pain, or blood in urine  Pertinent Past Medical History- HTN, PVD, Asthma, OA, CKD stage III  Medications- reviewed  Current Outpatient Medications  Medication Sig Dispense Refill  . aspirin 81 MG tablet Take 81 mg by mouth daily.      . Biotin 1000 MCG tablet Take 1,000 mcg by mouth daily.      . cholecalciferol (VITAMIN D) 400 UNITS TABS Take 400 Units by mouth 2 (two) times daily.      . enalapril (VASOTEC) 20 MG tablet TAKE ONE TABLET BY MOUTH EVERY DAY 90 tablet 1  . Flaxseed, Linseed, (FLAX SEED OIL) 1000 MG CAPS Take 1 capsule by mouth daily.      . fluticasone (FLOVENT HFA) 220 MCG/ACT inhaler Inhale 2 puffs into the lungs 2  (two) times daily. 1 Inhaler 12  . gabapentin (NEURONTIN) 300 MG capsule TAKE 1 CAPSULE BY MOUTH EVERY DAY. 90 capsule 1  . lactulose (CHRONULAC) 10 GM/15ML solution Take 30 mLs (20 g total) by mouth daily as needed for mild constipation. 240 mL 0  . levofloxacin (LEVAQUIN) 250 MG tablet Take 1 tablet (250 mg total) by mouth daily. With food 7 tablet 0  . Multiple Vitamin (MULTIVITAMIN) capsule Take 1 capsule by mouth daily.      . mupirocin cream (BACTROBAN) 2 % Apply 1 application topically 3 (three) times daily. 15 g 0  . nitroGLYCERIN (NITROSTAT) 0.3 MG SL tablet DISSOLVE 1 TABLET UNDER TONGUE EVERY 5 MINUTES FOR 3 DOSES AS NEEDED FOR CHEST PAIN. IF NO RELIEF GO TO ER. 30 tablet 0  . NUCYNTA 50 MG TABS tablet Take 50 mg by mouth 2 (two) times daily as needed (Take 1 tablet twice a day as needed).     . nystatin cream (MYCOSTATIN) Apply 1 application topically 2 (two) times daily. 30 g 6  . omeprazole (PRILOSEC) 20 MG capsule TAKE 1 CAPSULE BY MOUTH EVERY DAY. 90 capsule 1  . oxybutynin (DITROPAN) 5 MG tablet TAKE ONE TABLET BY MOUTH EVERY DAY 90 tablet 1  . propranolol ER (INDERAL LA) 60 MG 24 hr capsule TAKE 1 CAPSULE BY MOUTH EVERY DAY 90 capsule 1  . sertraline (  ZOLOFT) 100 MG tablet TAKE ONE TABLET BY MOUTH EVERY DAY 90 tablet 1  . triamterene-hydrochlorothiazide (DYAZIDE) 37.5-25 MG capsule TAKE 1 CAPSULE BY MOUTH EVERY DAY 90 capsule 1  . verapamil (CALAN-SR) 180 MG CR tablet Take 1 tablet (180 mg total) by mouth at bedtime. 90 tablet 3   No current facility-administered medications for this visit.     Objective: BP: 150/62; T: 98.3 F; SpO2: 99%; HR: 67 Gen: NAD, resting comfortably HEENT: Oropharynx is clear and moist. CV: RRR no murmurs rubs or gallops Lungs: CTAB no crackles, wheeze, rhonchi Abdomen: soft/nontender/nondistended/normal bowel sounds. No rebound or guarding.  No CVA tenderness.   Suprapubic tenderness not present Ext: no edema Skin: warm, dry, no rash Neuro:  grossly normal, moves all extremities  Assessment/Plan: 1. Cystitis Urine culture resulted on 11/19/17 +Enterococcus; previous provider prescribed levaquin 250 mg daily with food x 1 week which culture noted sensitivity to this antibiotic. Patient has started antibiotic and denies adverse effects. Advised patient and daughter who accompanied patient to complete antibiotic course as prescribed.  2. Decreased urine output Difficult to determine specific information related to decrease in urine output. Patient reports a decrease in her urine output and also states that she has voided today and wears incontinence products which have been changed one time by the time of the appointment. She states that this is less than usual. We discussed the importance of hydration and she will increase water intake to improve urine output. She is concerned about this decrease so we opted to check BMP today to assess kidney function. Further advised patient to focus on documenting urine output and water intake to provide information for developing further evaluation. Also, advised patient and daughter the importance of seeking medical care if she is not able to urinate after initiating measures to increase water intake.  - Basic metabolic panel  Advised patient to complete antibiotic and she should follow up if her symptoms do not improve in 2 to 3 days, worsen, she develops a fever >101, back pain, or is unable to urinate. Patient voiced understanding and agreed with plan.  Finally, we reviewed reasons to return to care including if symptoms worsen or persist or new concerns arise- once again particularly fever, N/V, or flank pain.    Laurita Quint, FNP

## 2017-11-21 NOTE — Patient Instructions (Signed)
Please increase water intake and drink enough water to keep your urine pale yellow or clear.  If you are not able to urinate with increasing your fluids or symptoms worsen, please seek care immediately.  It is important to treat the symptoms of your urinary tract infection. Please continue your medication as prescribed by your previous provider.   Urinary Tract Infection, Adult A urinary tract infection (UTI) is an infection of any part of the urinary tract. The urinary tract includes the:  Kidneys.  Ureters.  Bladder.  Urethra.  These organs make, store, and get rid of pee (urine) in the body. Follow these instructions at home:  Take over-the-counter and prescription medicines only as told by your doctor.  If you were prescribed an antibiotic medicine, take it as told by your doctor. Do not stop taking the antibiotic even if you start to feel better.  Avoid the following drinks: ? Alcohol. ? Caffeine. ? Tea. ? Carbonated drinks.  Drink enough fluid to keep your pee clear or pale yellow.  Keep all follow-up visits as told by your doctor. This is important.  Make sure to: ? Empty your bladder often and completely. Do not to hold pee for long periods of time. ? Empty your bladder before and after sex. ? Wipe from front to back after a bowel movement if you are female. Use each tissue one time when you wipe. Contact a doctor if:  You have back pain.  You have a fever.  You feel sick to your stomach (nauseous).  You throw up (vomit).  Your symptoms do not get better after 3 days.  Your symptoms go away and then come back. Get help right away if:  You have very bad back pain.  You have very bad lower belly (abdominal) pain.  You are throwing up and cannot keep down any medicines or water. This information is not intended to replace advice given to you by your health care provider. Make sure you discuss any questions you have with your health care  provider. Document Released: 10/26/2007 Document Revised: 10/15/2015 Document Reviewed: 03/30/2015 Elsevier Interactive Patient Education  [employer].

## 2017-11-22 DIAGNOSIS — I110 Hypertensive heart disease with heart failure: Secondary | ICD-10-CM | POA: Diagnosis not present

## 2017-11-22 DIAGNOSIS — I739 Peripheral vascular disease, unspecified: Secondary | ICD-10-CM | POA: Diagnosis not present

## 2017-11-22 DIAGNOSIS — M5116 Intervertebral disc disorders with radiculopathy, lumbar region: Secondary | ICD-10-CM | POA: Diagnosis not present

## 2017-11-22 DIAGNOSIS — R238 Other skin changes: Secondary | ICD-10-CM | POA: Diagnosis not present

## 2017-11-22 DIAGNOSIS — I509 Heart failure, unspecified: Secondary | ICD-10-CM | POA: Diagnosis not present

## 2017-11-22 DIAGNOSIS — J449 Chronic obstructive pulmonary disease, unspecified: Secondary | ICD-10-CM | POA: Diagnosis not present

## 2017-11-22 LAB — BASIC METABOLIC PANEL
BUN: 20 mg/dL (ref 6–23)
CALCIUM: 9.5 mg/dL (ref 8.4–10.5)
CO2: 26 meq/L (ref 19–32)
Chloride: 92 mEq/L — ABNORMAL LOW (ref 96–112)
Creatinine, Ser: 1.03 mg/dL (ref 0.40–1.20)
GFR: 53 mL/min — AB (ref >60.00)
Glucose, Bld: 101 mg/dL — ABNORMAL HIGH (ref 70–99)
POTASSIUM: 4.2 meq/L (ref 3.5–5.1)
SODIUM: 128 meq/L — AB (ref 135–145)

## 2017-11-23 NOTE — Telephone Encounter (Signed)
Attempted to call patient and discuss lab results however no answer.  Lab results reveal a low sodium. Advise discontinuing Dyazide and confirm that patient is not experiencing polydipsia (increased thirst) and see how she is feeling. At visit patient was not experiencing increased thirst, HA, confusion, N/V, or dizziness.  Recheck of sodium early next week. Please make an appointment for lab recheck when patient is at her appointment tomorrow for reading of her PPD result.

## 2017-11-24 ENCOUNTER — Telehealth: Payer: Self-pay

## 2017-11-24 ENCOUNTER — Other Ambulatory Visit: Payer: Self-pay | Admitting: Family Medicine

## 2017-11-24 ENCOUNTER — Telehealth: Payer: Self-pay | Admitting: *Deleted

## 2017-11-24 ENCOUNTER — Encounter: Payer: Self-pay | Admitting: Family Medicine

## 2017-11-24 ENCOUNTER — Ambulatory Visit: Payer: Medicare Other

## 2017-11-24 ENCOUNTER — Ambulatory Visit (INDEPENDENT_AMBULATORY_CARE_PROVIDER_SITE_OTHER): Payer: Medicare Other | Admitting: Family Medicine

## 2017-11-24 VITALS — BP 140/68 | HR 76 | Temp 98.5°F | Resp 16 | Wt 169.0 lb

## 2017-11-24 DIAGNOSIS — R300 Dysuria: Secondary | ICD-10-CM | POA: Diagnosis not present

## 2017-11-24 DIAGNOSIS — R519 Headache, unspecified: Secondary | ICD-10-CM | POA: Insufficient documentation

## 2017-11-24 DIAGNOSIS — N3000 Acute cystitis without hematuria: Secondary | ICD-10-CM | POA: Diagnosis not present

## 2017-11-24 DIAGNOSIS — G44209 Tension-type headache, unspecified, not intractable: Secondary | ICD-10-CM | POA: Diagnosis not present

## 2017-11-24 DIAGNOSIS — E871 Hypo-osmolality and hyponatremia: Secondary | ICD-10-CM | POA: Diagnosis not present

## 2017-11-24 DIAGNOSIS — R51 Headache: Secondary | ICD-10-CM

## 2017-11-24 LAB — POCT URINALYSIS DIPSTICK
BILIRUBIN UA: NEGATIVE
Glucose, UA: NEGATIVE
Ketones, UA: NEGATIVE
NITRITE UA: NEGATIVE
PH UA: 7 (ref 5.0–8.0)
PROTEIN UA: NEGATIVE
Spec Grav, UA: 1.015 (ref 1.010–1.025)
UROBILINOGEN UA: 0.2 U/dL

## 2017-11-24 LAB — TB SKIN TEST: TB Skin Test: NEGATIVE

## 2017-11-24 NOTE — Telephone Encounter (Signed)
Patient was seen in the office today

## 2017-11-24 NOTE — Telephone Encounter (Addendum)
Called patient to review lab results from 11/21/17 . She stated that she thinks she had reaction from antibiotic Levaquin she states this was prescribed by Dr Aundra Dubin  .   She states on 11/22/17  She experienced hot/cold sweats , hallucinations seeing letters on walls.   She is urinating better still has a little pressure  Urine output is better overall feels better. She wonders if antibiotic could be called in.  Jefm Miles, NP on 11/21/17 See lab result 11/21/17 patient had low sodium

## 2017-11-24 NOTE — Telephone Encounter (Signed)
Patient states she has been drinking increased fluids as she was advised by Almyra Free, NP on office visit on 11/21/17. She has dry mouth chronically , but has been thirsty a lot recently.  Still has headache off and on , fatigue.   She stopped antibiotic on Wednesday at 3:00pm .   She is coming in for PPD read at 2:00 pm.

## 2017-11-24 NOTE — Assessment & Plan Note (Addendum)
Patient recently treated for UTI with Levaquin.  Urine culture revealed enterococcus sensitive to Levaquin.  Based on review of up-to-date 3 days of Levaquin should be adequate treatment.  Her symptoms have improved significantly.  We will recheck her urine today and send for culture and microscopy.  I discussed with the patient that if there was not overwhelming evidence on the urinalysis and given that she has improved significantly we would await the urine culture to determine if she needed further antibiotics.  She voiced understanding.  Urinalysis returned after the patient left and there are leukocytes though no nitrites on urinalysis.  Given that she has improved from previously we will await urine culture to determine if there truly is still infection.  She is given return precautions.  It is a little difficult to tell whether or not the symptoms she had the other night could have been related to hyponatremia, the antibiotic, or her UTI.  She has improved from those symptoms.  I discussed if they recurred she needed to be evaluated over the weekend.

## 2017-11-24 NOTE — Telephone Encounter (Addendum)
I spoke with patient this am , see lab results.  Thanks.

## 2017-11-24 NOTE — Telephone Encounter (Signed)
Copied from [place] [id number]. Topic: Quick Communication - See Telephone Encounter >> Nov 24, 2017  9:05 AM Antonieta Iba C wrote: CRM for notification. See Telephone encounter for: 11/24/17.  Pt's son Quillian Quince called in today to make provider aware that medication that was prescribed for UTI Is not helping pt. Pt is unable to take medication, son says that it causes nausea. Pt's mom says that he think that medication is causing some confusion. Son would like to know if provider could send in a different antibiotic for pt? He said that pt get UTI commonly and PCP usually send something else in for her that works just fine.   ALSO, son says that they are trying to get pt in to an assistant living house at [place]. He says that the office have a FL2 form that has to be completed. Son says that they have an apt with DSS on Monday and would like to have form then Pt has and TB test reading today at 2 if need to discuss in person.   Please assist further.   Pharmacy: [facility], [address]

## 2017-11-24 NOTE — Progress Notes (Signed)
Tommi Rumps, MD Phone: [phone]  Cindy MONRROY is a 84 y.o. female who presents today for same-day visit.  UTI: Patient recently seen and treated for UTI by 1 of the other physicians here.  She completed 3 days of Levaquin.  She notes her dysuria is almost completely resolved.  She notes mild pressure sensation in her suprapubic area though no abdominal pain or hematuria.  She feels significantly improved from this.    Hyponatremia: Found to be hyponatremic.  She was advised to discontinue the Dyazide earlier today.  She notes no history of hyponatremia.  She does note 2 nights ago she felt a little nauseated and had some sweats though no fever.  They report that she had been seeing letters on the wall.  Those things resolved with discontinuing the Levaquin.  She notes no current hallucinations or hallucinations today.  She denies confusion.  No nausea today.  Overall feels weak.  No balance issues.  Her son notes she appears significantly improved.  Headache: They report she has had a mild intermittent headache posteriorly since she fell in the middle of June.  She had an extensive evaluation in the ED with imaging of her cervical spine as well as head at that time with no noted cause.  She notes that the headache is not currently present.  Feels as though it comes from the musculature in her neck moving up into her head.  She has had no numbness or focal weakness.  No vision changes.  She notes her fall was related to her having been bent over and then falling over.  Social History   Tobacco Use  Smoking Status Never Smoker  Smokeless Tobacco Never Used     ROS see history of present illness  Objective  Physical Exam Vitals:   11/24/17 1459  BP: 140/68  Pulse: 76  Resp: 16  Temp: 98.5 F (36.9 C)  SpO2: 93%    BP Readings from Last 3 Encounters:  11/24/17 140/68  11/21/17 (!) 150/62  11/14/17 138/62   Wt Readings from Last 3 Encounters:  11/24/17 169 lb (76.7 kg)    11/21/17 175 lb 4 oz (79.5 kg)  11/14/17 175 lb 3.2 oz (79.5 kg)    Physical Exam  Constitutional: No distress.  Cardiovascular: Normal rate, regular rhythm and normal heart sounds.  Pulmonary/Chest: Effort normal and breath sounds normal.  Abdominal: Soft. Bowel sounds are normal. She exhibits no distension. There is no tenderness.  Musculoskeletal: She exhibits no edema.  No midline neck tenderness, no midline neck step-off, no muscular neck tenderness  Neurological: She is alert.  Decreased hearing to finger rub, otherwise CN 2-12 intact, 5/5 strength in bilateral biceps, triceps, grip, quads, hamstrings, plantar and dorsiflexion, sensation to light touch intact in bilateral UE and LE, patient is able to stand on her own with use of her walker  Skin: Skin is warm and dry. She is not diaphoretic.     Assessment/Plan: Please see individual problem list.  UTI (urinary tract infection) Patient recently treated for UTI with Levaquin.  Urine culture revealed enterococcus sensitive to Levaquin.  Based on review of up-to-date 3 days of Levaquin should be adequate treatment.  Her symptoms have improved significantly.  We will recheck her urine today and send for culture and microscopy.  I discussed with the patient that if there was not overwhelming evidence on the urinalysis and given that she has improved significantly we would await the urine culture to determine if she needed  further antibiotics.  She voiced understanding.  Urinalysis returned after the patient left and there are leukocytes though no nitrites on urinalysis.  Given that she has improved from previously we will await urine culture to determine if there truly is still infection.  She is given return precautions.  It is a little difficult to tell whether or not the symptoms she had the other night could have been related to hyponatremia, the antibiotic, or her UTI.  She has improved from those symptoms.  I discussed if they recurred  she needed to be evaluated over the weekend.  Hyponatremia Patient discontinued Dyazide.  We are going to recheck her sodium level today given her symptoms that were reported 2 days ago.  The symptoms have improved so I would expect the sodium level to be improved.  I discussed if she has recurrence of the symptoms she needs to go to the emergency room.  Headache Intermittent headache.  Neurologically intact.  I suspect this is related to tension and muscular strain from her prior fall.  She had imaging of her head and neck which did not reveal a contributing cause to her headaches.  Headache could also be related to hyponatremia.  We will recheck her sodium level.  She is given return precautions.   Orders Placed This Encounter  Procedures  . Urine Culture  . Basic Metabolic Panel (BMET)  . Urine Microscopic Only  . POCT Urinalysis Dipstick    No orders of the defined types were placed in this encounter.    Tommi Rumps, MD [place]

## 2017-11-24 NOTE — Patient Instructions (Signed)
Nice to see you. We will recheck your urine today. We will check your sodium level as well.  If you have recurrence of nausea or you develop vomiting, hallucination, confusion, balance issues, or any new symptoms he should go to the emergency room.

## 2017-11-24 NOTE — Assessment & Plan Note (Signed)
Intermittent headache.  Neurologically intact.  I suspect this is related to tension and muscular strain from her prior fall.  She had imaging of her head and neck which did not reveal a contributing cause to her headaches.  Headache could also be related to hyponatremia.  We will recheck her sodium level.  She is given return precautions.

## 2017-11-24 NOTE — Assessment & Plan Note (Signed)
Patient discontinued Dyazide.  We are going to recheck her sodium level today given her symptoms that were reported 2 days ago.  The symptoms have improved so I would expect the sodium level to be improved.  I discussed if she has recurrence of the symptoms she needs to go to the emergency room.

## 2017-11-24 NOTE — Telephone Encounter (Signed)
Note reviewed. Labs reviewed. Urine culture reviewed. Did she stop the antibiotic? Has she had any increased thirst, nausea, vomiting, headache, confusion, or other symptoms? It sounds as though she will likely need to be seen today given her low sodium though we need to see if she is having any symptoms.

## 2017-11-25 ENCOUNTER — Emergency Department
Admission: EM | Admit: 2017-11-25 | Discharge: 2017-11-25 | Disposition: A | Discharge status: Home / Self Care | Payer: Medicare Other | Attending: Emergency Medicine | Admitting: Emergency Medicine

## 2017-11-25 ENCOUNTER — Telehealth: Payer: Self-pay | Admitting: Family Medicine

## 2017-11-25 ENCOUNTER — Encounter: Payer: Self-pay | Admitting: Emergency Medicine

## 2017-11-25 DIAGNOSIS — I129 Hypertensive chronic kidney disease with stage 1 through stage 4 chronic kidney disease, or unspecified chronic kidney disease: Secondary | ICD-10-CM | POA: Insufficient documentation

## 2017-11-25 DIAGNOSIS — Z7982 Long term (current) use of aspirin: Secondary | ICD-10-CM | POA: Diagnosis not present

## 2017-11-25 DIAGNOSIS — E871 Hypo-osmolality and hyponatremia: Secondary | ICD-10-CM

## 2017-11-25 DIAGNOSIS — J449 Chronic obstructive pulmonary disease, unspecified: Secondary | ICD-10-CM | POA: Insufficient documentation

## 2017-11-25 DIAGNOSIS — R5383 Other fatigue: Secondary | ICD-10-CM | POA: Diagnosis not present

## 2017-11-25 DIAGNOSIS — N183 Chronic kidney disease, stage 3 (moderate): Secondary | ICD-10-CM | POA: Diagnosis not present

## 2017-11-25 DIAGNOSIS — Z79899 Other long term (current) drug therapy: Secondary | ICD-10-CM | POA: Diagnosis not present

## 2017-11-25 DIAGNOSIS — R110 Nausea: Secondary | ICD-10-CM | POA: Diagnosis not present

## 2017-11-25 DIAGNOSIS — R300 Dysuria: Secondary | ICD-10-CM | POA: Diagnosis not present

## 2017-11-25 LAB — URINE CULTURE
MICRO NUMBER:: 90799229
SPECIMEN QUALITY: ADEQUATE

## 2017-11-25 LAB — CBC WITH DIFFERENTIAL/PLATELET
Basophils Absolute: 0.1 10*3/uL (ref 0–0.1)
Basophils Relative: 1 %
EOS ABS: 0.2 10*3/uL (ref 0–0.7)
EOS PCT: 4 %
HCT: 35.7 % (ref 35.0–47.0)
Hemoglobin: 12.3 g/dL (ref 12.0–16.0)
LYMPHS ABS: 1.5 10*3/uL (ref 1.0–3.6)
Lymphocytes Relative: 28 %
MCH: 32.6 pg (ref 26.0–34.0)
MCHC: 34.5 g/dL (ref 32.0–36.0)
MCV: 94.6 fL (ref 80.0–100.0)
MONOS PCT: 11 %
Monocytes Absolute: 0.6 10*3/uL (ref 0.2–0.9)
Neutro Abs: 3.2 10*3/uL (ref 1.4–6.5)
Neutrophils Relative %: 56 %
Platelets: 282 10*3/uL (ref 150–440)
RBC: 3.77 MIL/uL — ABNORMAL LOW (ref 3.80–5.20)
RDW: 13.9 % (ref 11.5–14.5)
WBC: 5.5 10*3/uL (ref 3.6–11.0)

## 2017-11-25 LAB — URINALYSIS, COMPLETE (UACMP) WITH MICROSCOPIC
BILIRUBIN URINE: NEGATIVE
Bacteria, UA: NONE SEEN
Glucose, UA: NEGATIVE mg/dL
HGB URINE DIPSTICK: NEGATIVE
Ketones, ur: NEGATIVE mg/dL
NITRITE: NEGATIVE
PROTEIN: NEGATIVE mg/dL
Specific Gravity, Urine: 1.005 (ref 1.005–1.030)
pH: 7 (ref 5.0–8.0)

## 2017-11-25 LAB — COMPREHENSIVE METABOLIC PANEL
ALT: 14 U/L (ref 0–44)
AST: 26 U/L (ref 15–41)
Albumin: 3.5 g/dL (ref 3.5–5.0)
Alkaline Phosphatase: 70 U/L (ref 38–126)
Anion gap: 10 (ref 5–15)
BUN: 22 mg/dL (ref 8–23)
CHLORIDE: 92 mmol/L — AB (ref 98–111)
CO2: 25 mmol/L (ref 22–32)
CREATININE: 0.89 mg/dL (ref 0.44–1.00)
Calcium: 9.1 mg/dL (ref 8.9–10.3)
GFR calc Af Amer: >60 mL/min (ref >60)
GFR calc non Af Amer: 54 mL/min — ABNORMAL LOW (ref >60)
Glucose, Bld: 122 mg/dL — ABNORMAL HIGH (ref 70–99)
Potassium: 3.7 mmol/L (ref 3.5–5.1)
SODIUM: 127 mmol/L — AB (ref 135–145)
Total Bilirubin: 1 mg/dL (ref 0.3–1.2)
Total Protein: 6.7 g/dL (ref 6.5–8.1)

## 2017-11-25 LAB — URINALYSIS, MICROSCOPIC ONLY: HYALINE CAST: NONE SEEN /LPF

## 2017-11-25 LAB — BASIC METABOLIC PANEL
BUN / CREAT RATIO: 18 (calc) (ref 6–22)
BUN: 20 mg/dL (ref 7–25)
CALCIUM: 9.9 mg/dL (ref 8.6–10.4)
CO2: 26 mmol/L (ref 20–32)
Chloride: 90 mmol/L — ABNORMAL LOW (ref 98–110)
Creat: 1.1 mg/dL — ABNORMAL HIGH (ref 0.60–0.88)
Glucose, Bld: 96 mg/dL (ref 65–99)
Potassium: 4.2 mmol/L (ref 3.5–5.3)
Sodium: 125 mmol/L — ABNORMAL LOW (ref 135–146)

## 2017-11-25 MED ORDER — SODIUM CHLORIDE 0.9 % IV BOLUS
500.0000 mL | Freq: Once | INTRAVENOUS | Status: AC
Start: 2017-11-25 — End: 2017-11-25
  Administered 2017-11-25: 500 mL via INTRAVENOUS

## 2017-11-25 MED ORDER — SODIUM CHLORIDE 0.9 % IV BOLUS
500.0000 mL | Freq: Once | INTRAVENOUS | Status: AC
  Administered 2017-11-25: 500 mL via INTRAVENOUS

## 2017-11-25 MED ORDER — SODIUM CHLORIDE 0.9 % IV BOLUS: 1000.0000 mL | Freq: Once | INTRAVENOUS | Status: DC

## 2017-11-25 NOTE — Telephone Encounter (Signed)
Called and spoke with the patient regarding her sodium. It has continued to trend down now to 125. She notes continued nausea and headaches. The low sodium could be medication related, though given that it has trended down further and she continues to have some symptoms I have advised that she be evaluated in the ED for recheck of sodium and consideration of IV fluids if appropriate with possible admission. She voiced understanding and she will call her son to transport her to the ED.

## 2017-11-25 NOTE — Telephone Encounter (Signed)
Completed.  Please make available to patient's son to pick up.

## 2017-11-25 NOTE — ED Provider Notes (Signed)
[facility] Emergency Department Provider Note ____________________________________________   First MD Initiated Contact with Patient 11/25/17 1105     (approximate)  I have reviewed the triage vital signs and the nursing notes.   HISTORY  Chief Complaint Abnormal Lab    HPI COTY STUDENT is a 84 y.o. female with PMH as noted below who presents with concern for hyponatremia, gradual onset, and measured to 125 yesterday at her doctor's office.  The patient reports some generalized fatigue, nausea, and some pressure when she urinates.  The patient was treated for a UTI earlier this week and took 3 doses of Levaquin but stated that it made her feel sick, so it was discontinued.  She saw her doctor yesterday and was called today because of the low sodium level and instructed to come into the hospital.   Past Medical History:  Diagnosis Date  . Arthritis    osteo  . COPD (chronic obstructive pulmonary disease) ([place])   . GERD (gastroesophageal reflux disease)   . Heart murmur   . Hernia   . Hyperlipidemia   . Hypertension   . Melanoma ([place])    face  . Syncope and collapse   . Urine incontinence     Patient Active Problem List   Diagnosis Date Noted  . Hyponatremia 11/24/2017  . Headache 11/24/2017  . Wound of left leg 11/01/2017  . Constipation 09/05/2017  . Other fatigue 12/22/2016  . Tremor 03/29/2016  . Amaurosis fugax 12/25/2015  . Asthma 09/17/2015  . PVD (peripheral vascular disease) ([place]) 05/28/2015  . B12 deficiency 08/29/2014  . Chronic back pain greater than 3 months duration 04/23/2013  . Chronic kidney disease (CKD), stage III (moderate) ([place]) 04/12/2012  . Hyperlipidemia LDL goal < 100 03/05/2012  . UTI (urinary tract infection) 07/26/2011  . Osteoarthritis 07/06/2011  . Hypertension 02/21/2011  . Depression/Anxiety 02/21/2011    Past Surgical History:  Procedure Laterality Date  . ABDOMINAL HYSTERECTOMY    .  CHOLECYSTECTOMY    . HERNIA REPAIR      Prior to Admission medications   Medication Sig Start Date End Date Taking? Authorizing Provider  aspirin 81 MG tablet Take 81 mg by mouth daily.      [provider]  Biotin 1000 MCG tablet Take 1,000 mcg by mouth daily.      [provider]  cholecalciferol (VITAMIN D) 400 UNITS TABS Take 400 Units by mouth 2 (two) times daily.      [provider]  enalapril (VASOTEC) 20 MG tablet TAKE ONE TABLET BY MOUTH EVERY DAY 09/15/17   Leone Haven, MD  Flaxseed, Linseed, (FLAX SEED OIL) 1000 MG CAPS Take 1 capsule by mouth daily.      [provider]  fluticasone (FLOVENT HFA) 220 MCG/ACT inhaler Inhale 2 puffs into the lungs 2 (two) times daily. 08/12/17   Leone Haven, MD  gabapentin (NEURONTIN) 300 MG capsule TAKE 1 CAPSULE BY MOUTH EVERY DAY. 11/15/17   Leone Haven, MD  lactulose (CHRONULAC) 10 GM/15ML solution Take 30 mLs (20 g total) by mouth daily as needed for mild constipation. 11/01/17   Burnard Hawthorne, FNP  Multiple Vitamin (MULTIVITAMIN) capsule Take 1 capsule by mouth daily.      [provider]  mupirocin cream (BACTROBAN) 2 % Apply 1 application topically 3 (three) times daily. 11/01/17   Burnard Hawthorne, FNP  nitroGLYCERIN (NITROSTAT) 0.3 MG SL tablet DISSOLVE 1 TABLET UNDER TONGUE EVERY 5 MINUTES  FOR 3 DOSES AS NEEDED FOR CHEST PAIN. IF NO RELIEF GO TO ER. 01/08/16   Leone Haven, MD  NUCYNTA 50 MG TABS tablet Take 50 mg by mouth 2 (two) times daily as needed (Take 1 tablet twice a day as needed).  04/12/13   [provider]  nystatin cream (MYCOSTATIN) Apply 1 application topically 2 (two) times daily. 07/10/17   Leone Haven, MD  omeprazole (PRILOSEC) 20 MG capsule TAKE 1 CAPSULE BY MOUTH EVERY DAY. 11/15/17   Leone Haven, MD  oxybutynin (DITROPAN) 5 MG tablet TAKE ONE TABLET BY MOUTH EVERY DAY 09/15/17   Leone Haven, MD  propranolol ER (INDERAL  LA) 60 MG 24 hr capsule TAKE 1 CAPSULE BY MOUTH EVERY DAY 01/13/17   Thersa Salt G, DO  sertraline (ZOLOFT) 100 MG tablet TAKE ONE TABLET BY MOUTH EVERY DAY 11/15/17   Leone Haven, MD  triamterene-hydrochlorothiazide (DYAZIDE) 37.5-25 MG capsule TAKE 1 CAPSULE BY MOUTH EVERY DAY 09/15/17   Leone Haven, MD  verapamil (CALAN-SR) 180 MG CR tablet Take 1 tablet (180 mg total) by mouth at bedtime. 07/10/17   Leone Haven, MD    Allergies Statins; Tramadol; Levaquin [levofloxacin in d5w]; Neomycin-polymyxin-gramicidin; Penicillins cross reactors; Sulfa antibiotics; and Tetracyclines & related  Family History  Problem Relation Age of Onset  . Stroke Father   . Heart disease Sister   . Cancer Brother        colon  . Heart disease Brother   . Cancer Sister        colon  . Heart attack Brother   . Heart attack Son   . Parkinson's disease Son     Social History Social History   Tobacco Use  . Smoking status: Never Smoker  . Smokeless tobacco: Never Used  Substance Use Topics  . Alcohol use: No  . Drug use: No    Review of Systems  Constitutional: No fever.  Positive for fatigue. Eyes: No redness. ENT: No sore throat. Cardiovascular: Denies chest pain. Respiratory: Denies shortness of breath. Gastrointestinal: Positive for nausea. Genitourinary: Positive for dysuria.  Musculoskeletal: Negative for back pain. Skin: Negative for rash. Neurological: Negative for headache.    ____________________________________________   PHYSICAL EXAM:  VITAL SIGNS: ED Triage Vitals  Enc Vitals Group     BP 11/25/17 1031 (!) 159/60     Pulse Rate 11/25/17 1031 74     Resp 11/25/17 1031 16     Temp 11/25/17 1031 98.7 F (37.1 C)     Temp Source 11/25/17 1031 Oral     SpO2 11/25/17 1031 97 %     Weight 11/25/17 1022 173 lb (78.5 kg)     Height 11/25/17 1022 5\' 4"  (1.626 m)     Head Circumference --      Peak Flow --      Pain Score 11/25/17 1022 0     Pain Loc --       Pain Edu? --      Excl. in [place]? --     Constitutional: Alert and oriented.  Relatively well appearing and in no acute distress. Eyes: Conjunctivae are normal.  Head: Atraumatic. Nose: No congestion/rhinnorhea. Mouth/Throat: Mucous membranes are moist.   Neck: Normal range of motion.  Cardiovascular: Good peripheral circulation. Respiratory: Normal respiratory effort.  Gastrointestinal: Soft and nontender. No distention.  Genitourinary: No CVA tenderness. Musculoskeletal: No lower extremity edema.  Extremities warm and well perfused.  Neurologic:  Normal speech and  language. No gross focal neurologic deficits are appreciated.  Skin:  Skin is warm and dry. No rash noted. Psychiatric: Mood and affect are normal. Speech and behavior are normal.  ____________________________________________   LABS (all labs ordered are listed, but only abnormal results are displayed)  Labs Reviewed  COMPREHENSIVE METABOLIC PANEL - Abnormal; Notable for the following components:      Result Value   Sodium 127 (*)    Chloride 92 (*)    Glucose, Bld 122 (*)    GFR calc non Af Amer 54 (*)    All other components within normal limits  CBC WITH DIFFERENTIAL/PLATELET - Abnormal; Notable for the following components:   RBC 3.77 (*)    All other components within normal limits  URINALYSIS, COMPLETE (UACMP) WITH MICROSCOPIC - Abnormal; Notable for the following components:   Color, Urine STRAW (*)    APPearance CLEAR (*)    Leukocytes, UA SMALL (*)    All other components within normal limits   ____________________________________________  EKG   ____________________________________________  RADIOLOGY    ____________________________________________   PROCEDURES  Procedure(s) performed: No  Procedures  Critical Care performed: No ____________________________________________   INITIAL IMPRESSION / ASSESSMENT AND PLAN / ED COURSE  Pertinent labs & imaging results that were available  during my care of the patient were reviewed by me and considered in my medical decision making (see chart for details).  84 year old female with PMH as noted above presents for evaluation of hyponatremia.  Patient reports some generalized fatigue, nausea, as well as some dysuria after she was recently treated for UTI.  I reviewed past medical records in Epic; the patient was diagnosed with the UTI on 11/21/2017 and treated with Levaquin although she states that it made her feel worse and so she discontinued after 3 doses.  I reviewed Dr. Ellen Henri notes from this week.  He saw the patient yesterday.  UA at that time was equivocal, so he elected to have the patient wait for culture before resuming a different antibiotic.  On exam, the patient is relatively well-appearing, vital signs are normal except for hypertension, and the exam is otherwise unremarkable.  Plan: Repeat labs, likely IV fluids if patient is still hyponatremic, and then consider resuming treatment for UTI.  ----------------------------------------- 1:19 PM on 11/25/2017 -----------------------------------------  Sodium today was still low but slightly better than yesterday.  I gave a total of 1 L of NS.  Given that the patient has only minimal symptoms there is no indication to recheck it at this time.  She can have it rechecked by Dr. Caryl Bis during the week.  Her UA today shows small leukocytes but is otherwise negative, so I have a low suspicion for active UTI.  I agree with Dr. Ellen Henri plan to reassess when the culture is back.  I counseled the patient and her son extensively on the results of the work-up.  Return precautions given, and they expressed understanding.  ____________________________________________   FINAL CLINICAL IMPRESSION(S) / ED DIAGNOSES  Final diagnoses:  Hyponatremia      NEW MEDICATIONS STARTED DURING THIS VISIT:  New Prescriptions   No medications on file     Note:  This  document was prepared using Dragon voice recognition software and may include unintentional dictation errors.    Arta Silence, MD 11/25/17 1321

## 2017-11-25 NOTE — Discharge Instructions (Addendum)
Talk to Dr. Caryl Bis on Monday to decide on a follow-up plan to have the sodium rechecked, and to get the results of the urine culture.  Return to the ER for new, worsening, persistent weakness, fevers, vomiting, or any other new or worsening symptoms that concern you.

## 2017-11-25 NOTE — ED Triage Notes (Signed)
Patient presents to the ED after being called by her doctor and being told to come to the ED to get fluids due to low sodium.  Patient is currently being treated for a UTI.  Patient is alert and oriented x 4.

## 2017-11-27 ENCOUNTER — Telehealth: Payer: Self-pay | Admitting: *Deleted

## 2017-11-27 NOTE — Telephone Encounter (Signed)
Patients daughter notified and form placed at front desk

## 2017-11-27 NOTE — Telephone Encounter (Signed)
See result note.  

## 2017-11-27 NOTE — Telephone Encounter (Signed)
Noted Followed by sonnenberg Not sure why I was on this communication

## 2017-11-27 NOTE — Telephone Encounter (Signed)
Copied from [place] [id number]. Topic: Quick Communication - Lab Results >> Nov 27, 2017  2:40 PM Scherrie Gerlach wrote: Daughter in law calling for results of UA done at the hospital.  Pt is at her home so please call them.

## 2017-11-28 ENCOUNTER — Telehealth: Payer: Self-pay | Admitting: *Deleted

## 2017-11-28 ENCOUNTER — Other Ambulatory Visit: Payer: Self-pay | Admitting: Family Medicine

## 2017-11-28 ENCOUNTER — Other Ambulatory Visit (INDEPENDENT_AMBULATORY_CARE_PROVIDER_SITE_OTHER): Payer: Medicare Other

## 2017-11-28 DIAGNOSIS — N390 Urinary tract infection, site not specified: Secondary | ICD-10-CM

## 2017-11-28 LAB — POCT URINALYSIS DIPSTICK
Glucose, UA: NEGATIVE
NITRITE UA: NEGATIVE
PH UA: 5.5 (ref 5.0–8.0)
PROTEIN UA: POSITIVE — AB
Spec Grav, UA: 1.025 (ref 1.010–1.025)
UROBILINOGEN UA: 0.2 U/dL

## 2017-11-28 LAB — URINALYSIS, MICROSCOPIC ONLY

## 2017-11-28 NOTE — Telephone Encounter (Signed)
Please place orders

## 2017-11-28 NOTE — Telephone Encounter (Signed)
Please see the result note. 

## 2017-11-28 NOTE — Telephone Encounter (Signed)
Please advise 

## 2017-11-28 NOTE — Telephone Encounter (Signed)
Orders placed.

## 2017-11-28 NOTE — Telephone Encounter (Signed)
Patient's daughter called to request that the results of the urine test be given at the following phone [phone], because patient will be spending the night with the daughter.

## 2017-11-28 NOTE — Telephone Encounter (Signed)
Pt was added to lab schedule for a urine & no order found. Please place future order.

## 2017-12-01 ENCOUNTER — Telehealth: Payer: Self-pay

## 2017-12-01 ENCOUNTER — Telehealth: Payer: Self-pay | Admitting: Family Medicine

## 2017-12-01 NOTE — Telephone Encounter (Signed)
Copied from [place] [phone]. Topic: Quick Communication - See Telephone Encounter >> Dec 01, 2017 10:49 AM Juanda Chance, CMA wrote: CRM for notification. See Telephone encounter for: 12/01/17. Left message to return call, ok for pec to speak to geraldine, medication list faxed yesterday is up to date and fl2 form was just recently filled out >> Dec 01, 2017 11:46 AM Boyd Kerbs wrote: Mikle Bosworth from [facility]  is needing one for this month.   Needs one with July date  Fax # [phone]

## 2017-12-01 NOTE — Telephone Encounter (Signed)
Pt. Requests that when the rest of her culture/sensitivity comes back, to call her son's (Dan) cell, due to her moving. [phone].

## 2017-12-01 NOTE — Telephone Encounter (Signed)
Noted Cindy Graham aware.

## 2017-12-01 NOTE — Telephone Encounter (Signed)
Please advise 

## 2017-12-01 NOTE — Telephone Encounter (Signed)
Copied from [place] [phone]. Topic: Quick Communication - See Telephone Encounter >> Dec 01, 2017 10:11 AM Nils Flack wrote: CRM for notification. See Telephone encounter for: 12/01/17. Mikle Bosworth called from [facility].  They need a new fl2 with a newer date on it.   They also need new update medication list. [phone]

## 2017-12-01 NOTE — Telephone Encounter (Signed)
See other message

## 2017-12-01 NOTE — Telephone Encounter (Signed)
Left message to return call, ok for pec to speak to Cindy Graham, medication list faxed yesterday is up to date and fl2 form was just recently filled out

## 2017-12-03 ENCOUNTER — Telehealth: Payer: Self-pay | Admitting: Family Medicine

## 2017-12-03 ENCOUNTER — Other Ambulatory Visit: Payer: Self-pay | Admitting: Family Medicine

## 2017-12-03 DIAGNOSIS — N390 Urinary tract infection, site not specified: Secondary | ICD-10-CM

## 2017-12-03 LAB — URINE CULTURE
MICRO NUMBER:: 90811118
SPECIMEN QUALITY: ADEQUATE

## 2017-12-03 MED ORDER — FOSFOMYCIN TROMETHAMINE 3 G PO PACK: 3.0000 g | PACK | Freq: Once | ORAL | 0 refills | Status: AC

## 2017-12-03 NOTE — Telephone Encounter (Signed)
Please call the patient to see if she has had any UTI symptoms (burning with urination, frequency, urgency). If she does not have symptoms she will not need an antibiotic. Please see if she took the antibiotic that was called in by the on call provider. If she continues to have the pressure sensation we may need to have her see urology.

## 2017-12-03 NOTE — Telephone Encounter (Signed)
Please contact Cindy Graham and determine what exactly they need. It seems as though they're requesting an fl2 and medication list though I want to confirm this. Thanks.

## 2017-12-03 NOTE — Telephone Encounter (Signed)
-----   Message from Ann Held, DO sent at 12/03/2017 12:17 PM EDT ----- Lab called with sensitivity on the urine---  Only 50-100,000 col  Sensitive to PCN, rocephin Spoke to pt son--- she is not having symptoms other than slight pressure---- but he had not talked to her today He will see her later ---  I called in Monurol to try if her symptoms have worsened but he will hold off until tomorrow if she is not having any symptoms and wait to here from you.

## 2017-12-04 NOTE — Telephone Encounter (Signed)
They need an fl2 form from the last 30 days, the last one was signed 10/27/17. Patient is coming in [place]

## 2017-12-04 NOTE — Telephone Encounter (Signed)
Patients son states she is having urinary pressure but no other symptoms.  He states she is experiencing bilateral ankle swelling that is radiating up to her knee since friday, spoke to [place] and he recommends that the patient is evaluated by urgent care or the walk in for the swelling.Informed patients son of this and he verbalized understanding, informed him she does not need to start the antibiotics.

## 2017-12-05 ENCOUNTER — Telehealth: Payer: Self-pay

## 2017-12-05 DIAGNOSIS — R269 Unspecified abnormalities of gait and mobility: Secondary | ICD-10-CM | POA: Diagnosis not present

## 2017-12-05 DIAGNOSIS — M5116 Intervertebral disc disorders with radiculopathy, lumbar region: Secondary | ICD-10-CM | POA: Diagnosis not present

## 2017-12-05 DIAGNOSIS — I739 Peripheral vascular disease, unspecified: Secondary | ICD-10-CM | POA: Diagnosis not present

## 2017-12-05 DIAGNOSIS — I509 Heart failure, unspecified: Secondary | ICD-10-CM | POA: Diagnosis not present

## 2017-12-05 DIAGNOSIS — I872 Venous insufficiency (chronic) (peripheral): Secondary | ICD-10-CM | POA: Diagnosis not present

## 2017-12-05 DIAGNOSIS — G894 Chronic pain syndrome: Secondary | ICD-10-CM | POA: Diagnosis not present

## 2017-12-05 DIAGNOSIS — R238 Other skin changes: Secondary | ICD-10-CM | POA: Diagnosis not present

## 2017-12-05 DIAGNOSIS — J449 Chronic obstructive pulmonary disease, unspecified: Secondary | ICD-10-CM | POA: Diagnosis not present

## 2017-12-05 DIAGNOSIS — I110 Hypertensive heart disease with heart failure: Secondary | ICD-10-CM | POA: Diagnosis not present

## 2017-12-05 DIAGNOSIS — I129 Hypertensive chronic kidney disease with stage 1 through stage 4 chronic kidney disease, or unspecified chronic kidney disease: Secondary | ICD-10-CM | POA: Diagnosis not present

## 2017-12-05 NOTE — Telephone Encounter (Signed)
Copied from [place] [phone]. Topic: Quick Communication - See Telephone Encounter >> Dec 05, 2017 12:45 PM Percell Belt A wrote: Mikle Bosworth from [facility] called in and is faxing over new FL2 today, they are needing a updated one.  Should rec a fax from them today   Fax number  [phone]

## 2017-12-06 ENCOUNTER — Encounter: Payer: Self-pay | Admitting: Family Medicine

## 2017-12-06 ENCOUNTER — Telehealth: Payer: Self-pay | Admitting: Family Medicine

## 2017-12-06 ENCOUNTER — Ambulatory Visit (INDEPENDENT_AMBULATORY_CARE_PROVIDER_SITE_OTHER): Payer: Medicare Other | Admitting: Family Medicine

## 2017-12-06 VITALS — BP 140/70 | HR 78 | Temp 98.0°F

## 2017-12-06 DIAGNOSIS — R600 Localized edema: Secondary | ICD-10-CM | POA: Diagnosis not present

## 2017-12-06 DIAGNOSIS — K5900 Constipation, unspecified: Secondary | ICD-10-CM | POA: Diagnosis not present

## 2017-12-06 DIAGNOSIS — N3000 Acute cystitis without hematuria: Secondary | ICD-10-CM | POA: Diagnosis not present

## 2017-12-06 DIAGNOSIS — E871 Hypo-osmolality and hyponatremia: Secondary | ICD-10-CM | POA: Diagnosis not present

## 2017-12-06 DIAGNOSIS — I10 Essential (primary) hypertension: Secondary | ICD-10-CM | POA: Diagnosis not present

## 2017-12-06 LAB — BASIC METABOLIC PANEL
BUN: 10 mg/dL (ref 6–23)
CHLORIDE: 96 meq/L (ref 96–112)
CO2: 27 meq/L (ref 19–32)
Calcium: 9.4 mg/dL (ref 8.4–10.5)
Creatinine, Ser: 0.91 mg/dL (ref 0.40–1.20)
GFR: 61.14 mL/min (ref >60.00)
GLUCOSE: 100 mg/dL — AB (ref 70–99)
POTASSIUM: 4 meq/L (ref 3.5–5.1)
SODIUM: 132 meq/L — AB (ref 135–145)

## 2017-12-06 LAB — TSH: TSH: 3.17 u[IU]/mL (ref 0.35–4.50)

## 2017-12-06 MED ORDER — GABAPENTIN 300 MG PO CAPS
300.0000 mg | ORAL_CAPSULE | Freq: Every day | ORAL | 1 refills | Status: DC
Start: 2017-12-06 — End: 2021-11-02

## 2017-12-06 MED ORDER — LACTULOSE 10 GM/15ML PO SOLN: 20.0000 g | Freq: Every day | ORAL | 0 refills | Status: DC | PRN

## 2017-12-06 NOTE — Telephone Encounter (Signed)
Please advise 

## 2017-12-06 NOTE — Telephone Encounter (Signed)
Cindy Graham from [facility] and state they have not received the FL2 form and checking status.

## 2017-12-06 NOTE — Assessment & Plan Note (Signed)
Patient with prior UTI.  She has had no symptoms.  I suspect the strep viridans that she had on her urine culture is related to colonization.  I discussed the difference between UTI and asymptomatic bacteriuria.  I discussed that given that she has no symptoms she does not require treatment.  If she develops symptoms they will let us know.

## 2017-12-06 NOTE — Telephone Encounter (Signed)
Received. Patient has an appointment today with Dr.Sonnenberg and will fax after visit

## 2017-12-06 NOTE — Patient Instructions (Signed)
Nice to see you. We will check your sodium again today. I sent in a refill of your lactulose. We will confirm what medications you have been taking at the assisted living facility and then determine if anything needs to be discontinued due to your hyponatremia and if you need to be on any additional medication.

## 2017-12-06 NOTE — Telephone Encounter (Signed)
Copied from [place] [phone]. Topic: General - Other >> Dec 06, 2017 11:09 AM Burchel, Abbi R wrote: If pain meds are changed during 7/17 OV, please send Dc order for previous orders.  Ph: [place]

## 2017-12-06 NOTE — Telephone Encounter (Signed)
I do not manage her pain medications. No action needed on this.

## 2017-12-06 NOTE — Assessment & Plan Note (Signed)
I suspect this is related to her narcotic use for her chronic pain.  We will refill lactulose.  I discussed using it daily until she has consistent bowel movements and then using it every other day or every third day to maintain herself with her bowel movements.

## 2017-12-06 NOTE — Assessment & Plan Note (Signed)
Well-controlled on recheck.  Continue current regimen.  We will request her MAR from her nursing facility to determine if she is taking the Dyazide as the patient could not remember.

## 2017-12-06 NOTE — Assessment & Plan Note (Signed)
Asymptomatic.  Recheck sodium today.  Will confirm whether or not she is still on Dyazide.  If sodium continues to remain low we will need to find an alternative antidepressant.

## 2017-12-06 NOTE — Progress Notes (Signed)
Cindy Rumps, MD Phone: [phone]  Cindy Graham is a 84 y.o. female who presents today for follow-up.  CC: Hyponatremia, recurrent UTI, hypertension, constipation  HYPERTENSION  Disease Monitoring  Home BP Monitoring at ALF Chest pain- no    Dyspnea- no Medications  Compliance-  Taking enalapril, propranolol, verapamil, unsure if she is taking Dyazide.           Edema-over the weekend developed edema up to her knees bilaterally, it has improved over the last 2 days, no orthopnea or PND, if she is off the Dyazide that potentially could be contributing  Prior UTI: Recent culture with strep viridans 50000-100000 colony-forming units.  She has had no dysuria, abdominal pain, or abdominal pressure.  She has chronic stable urinary urgency and frequency that is unchanged over many years.  Hyponatremia: Was evaluated in the emergency department and her sodium came up to 127.  She is on Zoloft.  They are unsure if she is taking Dyazide.  She had a mild episode of nausea this morning though that has resolved.  No vomiting.  No confusion.  Constipation: She does well with the lactulose and when taking it has bowel movements daily.  If she does not take it she will get to where she goes several days without a bowel movement.  Last bowel movement was Monday and it was a small amount.    Social History   Tobacco Use  Smoking Status Never Smoker  Smokeless Tobacco Never Used     ROS see history of present illness  Objective  Physical Exam Vitals:   12/06/17 1133 12/06/17 1206  BP: (!) 152/78 140/70  Pulse: 78   Temp: 98 F (36.7 C)   SpO2: 98%     BP Readings from Last 3 Encounters:  12/06/17 140/70  11/25/17 (!) 165/56  11/24/17 140/68   Wt Readings from Last 3 Encounters:  11/25/17 173 lb (78.5 kg)  11/24/17 169 lb (76.7 kg)  11/21/17 175 lb 4 oz (79.5 kg)    Physical Exam  Constitutional: No distress.  Cardiovascular: Normal rate, regular rhythm and normal heart  sounds.  Pulmonary/Chest: Effort normal and breath sounds normal.  Abdominal: Soft. Bowel sounds are normal. She exhibits no distension. There is no tenderness.  Exam done in patient's wheelchair given chronic difficulty ambulating  Musculoskeletal: She exhibits edema (1+ pitting edema bilateral lower extremities up to mid shin).  Neurological: She is alert.  Skin: Skin is warm and dry. She is not diaphoretic.     Assessment/Plan: Please see individual problem list.  Hypertension Well-controlled on recheck.  Continue current regimen.  We will request her MAR from her nursing facility to determine if she is taking the Dyazide as the patient could not remember.  UTI (urinary tract infection) Patient with prior UTI.  She has had no symptoms.  I suspect the strep viridans that she had on her urine culture is related to colonization.  I discussed the difference between UTI and asymptomatic bacteriuria.  I discussed that given that she has no symptoms she does not require treatment.  If she develops symptoms they will let us know.  Constipation I suspect this is related to her narcotic use for her chronic pain.  We will refill lactulose.  I discussed using it daily until she has consistent bowel movements and then using it every other day or every third day to maintain herself with her bowel movements.  Hyponatremia Asymptomatic.  Recheck sodium today.  Will confirm whether or  not she is still on Dyazide.  If sodium continues to remain low we will need to find an alternative antidepressant.   Orders Placed This Encounter  Procedures  . Basic Metabolic Panel (BMET)  . TSH    Meds ordered this encounter  Medications  . lactulose (CHRONULAC) 10 GM/15ML solution    Sig: Take 30 mLs (20 g total) by mouth daily as needed for mild constipation.    Dispense:  240 mL    Refill:  0  . gabapentin (NEURONTIN) 300 MG capsule    Sig: Take 1 capsule (300 mg total) by mouth at bedtime.    Dispense:   90 capsule    Refill:  1     Cindy Rumps, MD [place]

## 2017-12-06 NOTE — Telephone Encounter (Signed)
Is this ready?

## 2017-12-07 MED ORDER — AMLODIPINE BESYLATE 5 MG PO TABS: 5.0000 mg | ORAL_TABLET | Freq: Every day | ORAL | 3 refills | Status: DC

## 2017-12-07 NOTE — Telephone Encounter (Signed)
Patient is already at assisted living, can they check her blood pressure there in 1 week? [facility]

## 2017-12-07 NOTE — Telephone Encounter (Signed)
Amlodipine sent to pharmacy.  That is fine for them to check her blood pressure in 1 week though they need to contact us.  I have also put blood pressure guidelines on her FL 2.

## 2017-12-07 NOTE — Telephone Encounter (Signed)
I have signed this form.  It appears that the patient is still getting the Dyazide.  I would like for her to discontinue this given her recurrent hyponatremia.  I would like to replace it with amlodipine and have her return in 1 week for blood pressure check.  Please see if they are willing to do this and if so I will send in the amlodipine and then we will have her updated medication list to send with the FL to form.

## 2017-12-08 ENCOUNTER — Telehealth: Payer: Self-pay | Admitting: Family Medicine

## 2017-12-08 ENCOUNTER — Telehealth: Payer: Self-pay

## 2017-12-08 DIAGNOSIS — M6281 Muscle weakness (generalized): Secondary | ICD-10-CM | POA: Diagnosis not present

## 2017-12-08 DIAGNOSIS — R2681 Unsteadiness on feet: Secondary | ICD-10-CM | POA: Diagnosis not present

## 2017-12-08 NOTE — Telephone Encounter (Signed)
Noted  

## 2017-12-08 NOTE — Telephone Encounter (Signed)
Copied from [place] [phone]. Topic: Quick Communication - See Telephone Encounter >> Dec 08, 2017  4:01 PM Mylinda Latina, NT wrote: CRM for notification. See Telephone encounter for: 12/08/17. Raquel Sarna calling from [facility] states that the patient decline the rest of her Home health PT, She moved to an assistant living facility and may be getting PT there. Please advise .

## 2017-12-08 NOTE — Telephone Encounter (Signed)
Copied from [place] [phone]. Topic: General - Other >> Dec 08, 2017 10:38 AM Yvette Rack wrote: Reason for CRM: Dawn from [facility] calling for pt med list they fax # [phone] ([phone]

## 2017-12-08 NOTE — Telephone Encounter (Signed)
Pharmacy is wanting clarification as to whether the amlodipine is in addition to the other blood pressure medicines or if something was discontinued.

## 2017-12-08 NOTE — Telephone Encounter (Signed)
This was to replace the patient's Dyazide.  That medication was discontinued due to hyponatremia.

## 2017-12-08 NOTE — Telephone Encounter (Signed)
Copied from [place] [phone]. Topic: Quick Communication - See Telephone Encounter >> Dec 07, 2017  4:18 PM Antonieta Iba C wrote: CRM for notification. See Telephone encounter for: 12/07/17.   Pharmacy called in to clarify. They are stating that pt is taking several BP medications they would like to be sure that the amlodipine is in addition to OR if provider discontinued a medication?    Please advise.  [phone]

## 2017-12-09 ENCOUNTER — Other Ambulatory Visit: Payer: Self-pay | Admitting: Family Medicine

## 2017-12-09 DIAGNOSIS — E871 Hypo-osmolality and hyponatremia: Secondary | ICD-10-CM

## 2017-12-09 NOTE — Progress Notes (Signed)
bmet  

## 2017-12-11 ENCOUNTER — Telehealth: Payer: Self-pay | Admitting: *Deleted

## 2017-12-11 DIAGNOSIS — Z79899 Other long term (current) drug therapy: Secondary | ICD-10-CM | POA: Diagnosis not present

## 2017-12-11 NOTE — Telephone Encounter (Signed)
faxed

## 2017-12-11 NOTE — Telephone Encounter (Signed)
Patients son will call back to let us know if the patient is taking this.

## 2017-12-11 NOTE — Telephone Encounter (Signed)
Sorry, there is another phone note more recent than this one. I overlooked that she was on verapamil when I prescribed there amlodipine. She should not be on both. I will forward to Thurmond Butts to contact the patient or her son to determine why she is on verapamil. She should not start on amlodipine until we determine why she is taking verapamil.

## 2017-12-11 NOTE — Telephone Encounter (Signed)
Copied from [place] [phone]. Topic: Quick Communication - See Telephone Encounter >> Dec 07, 2017  4:18 PM Antonieta Iba C wrote: CRM for notification. See Telephone encounter for: 12/07/17.   Pharmacy called in to clarify. They are stating that pt is taking several BP medications they would like to be sure that the amlodipine is in addition to OR if provider discontinued a medication?    Please advise.  [phone]  >> Dec 11, 2017 10:43 AM Yvette Rack wrote: Dion Saucier pt son calling wanting someone to call the       [place], [place] -        about his Mothers rx of Amlodipine the pharmacy states that she has 2 RX that's in the same family as the Amlodipine and want to know which one she need to take  Please call him at (C) [phone]  (H) [phone]

## 2017-12-11 NOTE — Telephone Encounter (Signed)
Noted.  I overlooked the verapamil.  That is a similar medication to the amlodipine.  Please see what she takes the verapamil for.  They should hold off on starting the amlodipine until we have confirmed what she is taking the verapamil for.  Thanks.

## 2017-12-11 NOTE — Telephone Encounter (Signed)
Please advise 

## 2017-12-11 NOTE — Telephone Encounter (Signed)
Patients son will call back to let us know if the patient is taking this

## 2017-12-11 NOTE — Telephone Encounter (Signed)
Spoke with Izora Gala, pharmacist, and she stated that the amlodipine and the verapamil are very closely related and people typically aren't on both of those medications at the same time. Izora Gala is aware that she needs to d/c the dyazide but is also wanting to know if you would like to d/c the verapamil?

## 2017-12-12 NOTE — Telephone Encounter (Signed)
Patients son states she is taking verapamil, he notified the pharmacy not to fill the amlodipine and [facility] is aware as well.

## 2017-12-12 NOTE — Telephone Encounter (Signed)
Noted.  Please follow-up with the assisted living facility to ensure that they are going to be checking the patient's blood pressure.  They should contact us with several blood pressure readings.  I will need to know this to determine if we need to place her on an additional blood pressure medication given that I discontinued the Dyazide.

## 2017-12-13 DIAGNOSIS — M6281 Muscle weakness (generalized): Secondary | ICD-10-CM | POA: Diagnosis not present

## 2017-12-13 DIAGNOSIS — R2681 Unsteadiness on feet: Secondary | ICD-10-CM | POA: Diagnosis not present

## 2017-12-13 NOTE — Telephone Encounter (Signed)
Called [facility] in [place], receptionist states she will have nurse call us back

## 2017-12-14 DIAGNOSIS — R2681 Unsteadiness on feet: Secondary | ICD-10-CM | POA: Diagnosis not present

## 2017-12-14 DIAGNOSIS — M6281 Muscle weakness (generalized): Secondary | ICD-10-CM | POA: Diagnosis not present

## 2017-12-15 DIAGNOSIS — M5126 Other intervertebral disc displacement, lumbar region: Secondary | ICD-10-CM | POA: Diagnosis not present

## 2017-12-15 DIAGNOSIS — M5136 Other intervertebral disc degeneration, lumbar region: Secondary | ICD-10-CM | POA: Diagnosis not present

## 2017-12-15 DIAGNOSIS — M5416 Radiculopathy, lumbar region: Secondary | ICD-10-CM | POA: Diagnosis not present

## 2017-12-15 DIAGNOSIS — M6281 Muscle weakness (generalized): Secondary | ICD-10-CM | POA: Diagnosis not present

## 2017-12-15 DIAGNOSIS — R2681 Unsteadiness on feet: Secondary | ICD-10-CM | POA: Diagnosis not present

## 2017-12-16 DIAGNOSIS — M6281 Muscle weakness (generalized): Secondary | ICD-10-CM | POA: Diagnosis not present

## 2017-12-16 DIAGNOSIS — R2681 Unsteadiness on feet: Secondary | ICD-10-CM | POA: Diagnosis not present

## 2017-12-18 DIAGNOSIS — R2681 Unsteadiness on feet: Secondary | ICD-10-CM | POA: Diagnosis not present

## 2017-12-18 DIAGNOSIS — M6281 Muscle weakness (generalized): Secondary | ICD-10-CM | POA: Diagnosis not present

## 2017-12-19 DIAGNOSIS — J449 Chronic obstructive pulmonary disease, unspecified: Secondary | ICD-10-CM | POA: Diagnosis not present

## 2017-12-19 DIAGNOSIS — M5116 Intervertebral disc disorders with radiculopathy, lumbar region: Secondary | ICD-10-CM | POA: Diagnosis not present

## 2017-12-19 DIAGNOSIS — I739 Peripheral vascular disease, unspecified: Secondary | ICD-10-CM | POA: Diagnosis not present

## 2017-12-19 DIAGNOSIS — I110 Hypertensive heart disease with heart failure: Secondary | ICD-10-CM | POA: Diagnosis not present

## 2017-12-19 DIAGNOSIS — M6281 Muscle weakness (generalized): Secondary | ICD-10-CM | POA: Diagnosis not present

## 2017-12-19 DIAGNOSIS — I509 Heart failure, unspecified: Secondary | ICD-10-CM | POA: Diagnosis not present

## 2017-12-19 DIAGNOSIS — R238 Other skin changes: Secondary | ICD-10-CM | POA: Diagnosis not present

## 2017-12-19 DIAGNOSIS — R2681 Unsteadiness on feet: Secondary | ICD-10-CM | POA: Diagnosis not present

## 2017-12-20 DIAGNOSIS — I10 Essential (primary) hypertension: Secondary | ICD-10-CM | POA: Diagnosis not present

## 2017-12-20 DIAGNOSIS — M1990 Unspecified osteoarthritis, unspecified site: Secondary | ICD-10-CM | POA: Diagnosis not present

## 2017-12-20 DIAGNOSIS — F39 Unspecified mood [affective] disorder: Secondary | ICD-10-CM | POA: Diagnosis not present

## 2017-12-20 DIAGNOSIS — M6281 Muscle weakness (generalized): Secondary | ICD-10-CM | POA: Diagnosis not present

## 2017-12-20 DIAGNOSIS — R269 Unspecified abnormalities of gait and mobility: Secondary | ICD-10-CM | POA: Diagnosis not present

## 2017-12-20 DIAGNOSIS — R2681 Unsteadiness on feet: Secondary | ICD-10-CM | POA: Diagnosis not present

## 2017-12-23 DIAGNOSIS — M6281 Muscle weakness (generalized): Secondary | ICD-10-CM | POA: Diagnosis not present

## 2017-12-23 DIAGNOSIS — R2681 Unsteadiness on feet: Secondary | ICD-10-CM | POA: Diagnosis not present

## 2017-12-25 ENCOUNTER — Telehealth: Payer: Self-pay

## 2017-12-25 DIAGNOSIS — M6281 Muscle weakness (generalized): Secondary | ICD-10-CM | POA: Diagnosis not present

## 2017-12-25 DIAGNOSIS — R2681 Unsteadiness on feet: Secondary | ICD-10-CM | POA: Diagnosis not present

## 2017-12-25 NOTE — Telephone Encounter (Signed)
No return call from nurse at [facility], unable to reach nurse

## 2017-12-25 NOTE — Telephone Encounter (Signed)
Copied from [place] [phone]. Topic: Appointment Scheduling - Scheduling Inquiry for Clinic >> Dec 25, 2017 11:31 AM Scherrie Gerlach wrote: Reason for CRM: daughter called to cancel lab appt for Thursday, and advised please do not schedule any more appts for her until they call again

## 2017-12-26 DIAGNOSIS — J189 Pneumonia, unspecified organism: Secondary | ICD-10-CM | POA: Diagnosis not present

## 2017-12-26 DIAGNOSIS — M6281 Muscle weakness (generalized): Secondary | ICD-10-CM | POA: Diagnosis not present

## 2017-12-26 DIAGNOSIS — R2681 Unsteadiness on feet: Secondary | ICD-10-CM | POA: Diagnosis not present

## 2017-12-27 DIAGNOSIS — R2681 Unsteadiness on feet: Secondary | ICD-10-CM | POA: Diagnosis not present

## 2017-12-27 DIAGNOSIS — R269 Unspecified abnormalities of gait and mobility: Secondary | ICD-10-CM | POA: Diagnosis not present

## 2017-12-27 DIAGNOSIS — M1990 Unspecified osteoarthritis, unspecified site: Secondary | ICD-10-CM | POA: Diagnosis not present

## 2017-12-27 DIAGNOSIS — I10 Essential (primary) hypertension: Secondary | ICD-10-CM | POA: Diagnosis not present

## 2017-12-27 DIAGNOSIS — F39 Unspecified mood [affective] disorder: Secondary | ICD-10-CM | POA: Diagnosis not present

## 2017-12-27 DIAGNOSIS — M6281 Muscle weakness (generalized): Secondary | ICD-10-CM | POA: Diagnosis not present

## 2017-12-28 ENCOUNTER — Other Ambulatory Visit: Payer: Medicare Other

## 2017-12-29 DIAGNOSIS — M6281 Muscle weakness (generalized): Secondary | ICD-10-CM | POA: Diagnosis not present

## 2017-12-29 DIAGNOSIS — R2681 Unsteadiness on feet: Secondary | ICD-10-CM | POA: Diagnosis not present

## 2017-12-30 DIAGNOSIS — M6281 Muscle weakness (generalized): Secondary | ICD-10-CM | POA: Diagnosis not present

## 2017-12-30 DIAGNOSIS — R2681 Unsteadiness on feet: Secondary | ICD-10-CM | POA: Diagnosis not present

## 2018-01-01 DIAGNOSIS — M6281 Muscle weakness (generalized): Secondary | ICD-10-CM | POA: Diagnosis not present

## 2018-01-01 DIAGNOSIS — R2681 Unsteadiness on feet: Secondary | ICD-10-CM | POA: Diagnosis not present

## 2018-01-02 DIAGNOSIS — R2681 Unsteadiness on feet: Secondary | ICD-10-CM | POA: Diagnosis not present

## 2018-01-02 DIAGNOSIS — M6281 Muscle weakness (generalized): Secondary | ICD-10-CM | POA: Diagnosis not present

## 2018-01-03 DIAGNOSIS — R2681 Unsteadiness on feet: Secondary | ICD-10-CM | POA: Diagnosis not present

## 2018-01-03 DIAGNOSIS — M6281 Muscle weakness (generalized): Secondary | ICD-10-CM | POA: Diagnosis not present

## 2018-01-04 DIAGNOSIS — M6281 Muscle weakness (generalized): Secondary | ICD-10-CM | POA: Diagnosis not present

## 2018-01-04 DIAGNOSIS — R2681 Unsteadiness on feet: Secondary | ICD-10-CM | POA: Diagnosis not present

## 2018-01-05 DIAGNOSIS — R2681 Unsteadiness on feet: Secondary | ICD-10-CM | POA: Diagnosis not present

## 2018-01-05 DIAGNOSIS — M6281 Muscle weakness (generalized): Secondary | ICD-10-CM | POA: Diagnosis not present

## 2018-01-06 DIAGNOSIS — M6281 Muscle weakness (generalized): Secondary | ICD-10-CM | POA: Diagnosis not present

## 2018-01-06 DIAGNOSIS — R2681 Unsteadiness on feet: Secondary | ICD-10-CM | POA: Diagnosis not present

## 2018-01-09 DIAGNOSIS — R2681 Unsteadiness on feet: Secondary | ICD-10-CM | POA: Diagnosis not present

## 2018-01-09 DIAGNOSIS — M6281 Muscle weakness (generalized): Secondary | ICD-10-CM | POA: Diagnosis not present

## 2018-01-10 DIAGNOSIS — R2681 Unsteadiness on feet: Secondary | ICD-10-CM | POA: Diagnosis not present

## 2018-01-10 DIAGNOSIS — M6281 Muscle weakness (generalized): Secondary | ICD-10-CM | POA: Diagnosis not present

## 2018-01-11 DIAGNOSIS — M6281 Muscle weakness (generalized): Secondary | ICD-10-CM | POA: Diagnosis not present

## 2018-01-11 DIAGNOSIS — R2681 Unsteadiness on feet: Secondary | ICD-10-CM | POA: Diagnosis not present

## 2018-01-12 DIAGNOSIS — M6281 Muscle weakness (generalized): Secondary | ICD-10-CM | POA: Diagnosis not present

## 2018-01-12 DIAGNOSIS — R2681 Unsteadiness on feet: Secondary | ICD-10-CM | POA: Diagnosis not present

## 2018-01-13 DIAGNOSIS — R2681 Unsteadiness on feet: Secondary | ICD-10-CM | POA: Diagnosis not present

## 2018-01-13 DIAGNOSIS — M6281 Muscle weakness (generalized): Secondary | ICD-10-CM | POA: Diagnosis not present

## 2018-01-15 DIAGNOSIS — R2681 Unsteadiness on feet: Secondary | ICD-10-CM | POA: Diagnosis not present

## 2018-01-15 DIAGNOSIS — M6281 Muscle weakness (generalized): Secondary | ICD-10-CM | POA: Diagnosis not present

## 2018-01-16 DIAGNOSIS — R2681 Unsteadiness on feet: Secondary | ICD-10-CM | POA: Diagnosis not present

## 2018-01-16 DIAGNOSIS — M6281 Muscle weakness (generalized): Secondary | ICD-10-CM | POA: Diagnosis not present

## 2018-01-17 DIAGNOSIS — I10 Essential (primary) hypertension: Secondary | ICD-10-CM | POA: Diagnosis not present

## 2018-01-17 DIAGNOSIS — R269 Unspecified abnormalities of gait and mobility: Secondary | ICD-10-CM | POA: Diagnosis not present

## 2018-01-17 DIAGNOSIS — F39 Unspecified mood [affective] disorder: Secondary | ICD-10-CM | POA: Diagnosis not present

## 2018-01-17 DIAGNOSIS — M6281 Muscle weakness (generalized): Secondary | ICD-10-CM | POA: Diagnosis not present

## 2018-01-17 DIAGNOSIS — M25532 Pain in left wrist: Secondary | ICD-10-CM | POA: Diagnosis not present

## 2018-01-17 DIAGNOSIS — R2681 Unsteadiness on feet: Secondary | ICD-10-CM | POA: Diagnosis not present

## 2018-01-19 DIAGNOSIS — R2681 Unsteadiness on feet: Secondary | ICD-10-CM | POA: Diagnosis not present

## 2018-01-19 DIAGNOSIS — I129 Hypertensive chronic kidney disease with stage 1 through stage 4 chronic kidney disease, or unspecified chronic kidney disease: Secondary | ICD-10-CM | POA: Diagnosis not present

## 2018-01-19 DIAGNOSIS — I739 Peripheral vascular disease, unspecified: Secondary | ICD-10-CM | POA: Diagnosis not present

## 2018-01-19 DIAGNOSIS — M6281 Muscle weakness (generalized): Secondary | ICD-10-CM | POA: Diagnosis not present

## 2018-01-19 DIAGNOSIS — N189 Chronic kidney disease, unspecified: Secondary | ICD-10-CM | POA: Diagnosis not present

## 2018-01-19 DIAGNOSIS — J449 Chronic obstructive pulmonary disease, unspecified: Secondary | ICD-10-CM | POA: Diagnosis not present

## 2018-01-20 DIAGNOSIS — M6281 Muscle weakness (generalized): Secondary | ICD-10-CM | POA: Diagnosis not present

## 2018-01-20 DIAGNOSIS — R2681 Unsteadiness on feet: Secondary | ICD-10-CM | POA: Diagnosis not present

## 2018-01-23 DIAGNOSIS — R2681 Unsteadiness on feet: Secondary | ICD-10-CM | POA: Diagnosis not present

## 2018-01-23 DIAGNOSIS — M6281 Muscle weakness (generalized): Secondary | ICD-10-CM | POA: Diagnosis not present

## 2018-01-24 DIAGNOSIS — R269 Unspecified abnormalities of gait and mobility: Secondary | ICD-10-CM | POA: Diagnosis not present

## 2018-01-24 DIAGNOSIS — F39 Unspecified mood [affective] disorder: Secondary | ICD-10-CM | POA: Diagnosis not present

## 2018-01-24 DIAGNOSIS — R2681 Unsteadiness on feet: Secondary | ICD-10-CM | POA: Diagnosis not present

## 2018-01-24 DIAGNOSIS — M6281 Muscle weakness (generalized): Secondary | ICD-10-CM | POA: Diagnosis not present

## 2018-01-24 DIAGNOSIS — M1990 Unspecified osteoarthritis, unspecified site: Secondary | ICD-10-CM | POA: Diagnosis not present

## 2018-01-24 DIAGNOSIS — I10 Essential (primary) hypertension: Secondary | ICD-10-CM | POA: Diagnosis not present

## 2018-01-25 DIAGNOSIS — R2681 Unsteadiness on feet: Secondary | ICD-10-CM | POA: Diagnosis not present

## 2018-01-25 DIAGNOSIS — M6281 Muscle weakness (generalized): Secondary | ICD-10-CM | POA: Diagnosis not present

## 2018-01-26 ENCOUNTER — Ambulatory Visit: Payer: Medicare Other

## 2018-01-27 DIAGNOSIS — M6281 Muscle weakness (generalized): Secondary | ICD-10-CM | POA: Diagnosis not present

## 2018-01-27 DIAGNOSIS — R2681 Unsteadiness on feet: Secondary | ICD-10-CM | POA: Diagnosis not present

## 2018-01-29 DIAGNOSIS — M6281 Muscle weakness (generalized): Secondary | ICD-10-CM | POA: Diagnosis not present

## 2018-01-29 DIAGNOSIS — R2681 Unsteadiness on feet: Secondary | ICD-10-CM | POA: Diagnosis not present

## 2018-01-30 DIAGNOSIS — M6281 Muscle weakness (generalized): Secondary | ICD-10-CM | POA: Diagnosis not present

## 2018-01-30 DIAGNOSIS — R2681 Unsteadiness on feet: Secondary | ICD-10-CM | POA: Diagnosis not present

## 2018-01-31 NOTE — Telephone Encounter (Signed)
Patient seen on 11/01/17

## 2018-02-03 DIAGNOSIS — M6281 Muscle weakness (generalized): Secondary | ICD-10-CM | POA: Diagnosis not present

## 2018-02-03 DIAGNOSIS — R2681 Unsteadiness on feet: Secondary | ICD-10-CM | POA: Diagnosis not present

## 2018-02-06 DIAGNOSIS — R2681 Unsteadiness on feet: Secondary | ICD-10-CM | POA: Diagnosis not present

## 2018-02-06 DIAGNOSIS — M6281 Muscle weakness (generalized): Secondary | ICD-10-CM | POA: Diagnosis not present

## 2018-02-07 DIAGNOSIS — M1990 Unspecified osteoarthritis, unspecified site: Secondary | ICD-10-CM | POA: Diagnosis not present

## 2018-02-07 DIAGNOSIS — M79604 Pain in right leg: Secondary | ICD-10-CM | POA: Diagnosis not present

## 2018-02-07 DIAGNOSIS — R2241 Localized swelling, mass and lump, right lower limb: Secondary | ICD-10-CM | POA: Diagnosis not present

## 2018-02-07 DIAGNOSIS — R609 Edema, unspecified: Secondary | ICD-10-CM | POA: Diagnosis not present

## 2018-02-07 DIAGNOSIS — Z79899 Other long term (current) drug therapy: Secondary | ICD-10-CM | POA: Diagnosis not present

## 2018-02-07 DIAGNOSIS — I10 Essential (primary) hypertension: Secondary | ICD-10-CM | POA: Diagnosis not present

## 2018-02-07 DIAGNOSIS — R21 Rash and other nonspecific skin eruption: Secondary | ICD-10-CM | POA: Diagnosis not present

## 2018-02-10 DIAGNOSIS — R2681 Unsteadiness on feet: Secondary | ICD-10-CM | POA: Diagnosis not present

## 2018-02-10 DIAGNOSIS — M6281 Muscle weakness (generalized): Secondary | ICD-10-CM | POA: Diagnosis not present

## 2018-02-11 ENCOUNTER — Other Ambulatory Visit: Payer: Self-pay

## 2018-02-11 ENCOUNTER — Encounter: Payer: Self-pay | Admitting: Emergency Medicine

## 2018-02-11 ENCOUNTER — Emergency Department
Admission: EM | Admit: 2018-02-11 | Discharge: 2018-02-11 | Disposition: A | Discharge status: Home / Self Care | Payer: Medicare Other | Attending: Student in an Organized Health Care Education/Training Program | Admitting: Student in an Organized Health Care Education/Training Program

## 2018-02-11 DIAGNOSIS — Y999 Unspecified external cause status: Secondary | ICD-10-CM | POA: Insufficient documentation

## 2018-02-11 DIAGNOSIS — Z79899 Other long term (current) drug therapy: Secondary | ICD-10-CM | POA: Insufficient documentation

## 2018-02-11 DIAGNOSIS — W228XXA Striking against or struck by other objects, initial encounter: Secondary | ICD-10-CM | POA: Diagnosis not present

## 2018-02-11 DIAGNOSIS — J449 Chronic obstructive pulmonary disease, unspecified: Secondary | ICD-10-CM | POA: Insufficient documentation

## 2018-02-11 DIAGNOSIS — Z7982 Long term (current) use of aspirin: Secondary | ICD-10-CM | POA: Insufficient documentation

## 2018-02-11 DIAGNOSIS — Y939 Activity, unspecified: Secondary | ICD-10-CM | POA: Diagnosis not present

## 2018-02-11 DIAGNOSIS — Z85820 Personal history of malignant melanoma of skin: Secondary | ICD-10-CM | POA: Diagnosis not present

## 2018-02-11 DIAGNOSIS — Y92129 Unspecified place in nursing home as the place of occurrence of the external cause: Secondary | ICD-10-CM | POA: Diagnosis not present

## 2018-02-11 DIAGNOSIS — I10 Essential (primary) hypertension: Secondary | ICD-10-CM | POA: Diagnosis not present

## 2018-02-11 DIAGNOSIS — S81812A Laceration without foreign body, left lower leg, initial encounter: Secondary | ICD-10-CM | POA: Diagnosis not present

## 2018-02-11 DIAGNOSIS — Z23 Encounter for immunization: Secondary | ICD-10-CM | POA: Insufficient documentation

## 2018-02-11 MED ORDER — TETANUS-DIPHTH-ACELL PERTUSSIS 5-2.5-18.5 LF-MCG/0.5 IM SUSP
0.5000 mL | Freq: Once | INTRAMUSCULAR | Status: AC
  Administered 2018-02-11: 0.5 mL via INTRAMUSCULAR
  Filled 2018-02-11: qty 0.5

## 2018-02-11 NOTE — Discharge Instructions (Addendum)
Keep the leg as dry as possible for the next 5 to 7 days.  The Steri-Strips will peel off on their own.  The Dermabond will peel off on its own.  Try not to disrupt the wound.  Reapply a dry dressing daily to keep the wound covered.

## 2018-02-11 NOTE — ED Notes (Signed)
Pt sustained a skin tear last pm Bumped it on a metal walker staff at assisted living dressed it last pm  - bleeding  subsided

## 2018-02-11 NOTE — ED Triage Notes (Signed)
Hit left lower leg on walker last night.  Patient has a large skin tear. Bleeding controlled.  Dressing intact.

## 2018-02-11 NOTE — ED Notes (Signed)
tdap r arm

## 2018-02-11 NOTE — ED Provider Notes (Signed)
[facility] Emergency Department Provider Note  ____________________________________________   First MD Initiated Contact with Patient 02/11/18 1146     (approximate)  I have reviewed the triage vital signs and the nursing notes.   HISTORY  Chief Complaint Leg Injury    HPI Cindy Graham is a 84 y.o. female presents emergency department complaining of a skin tear to the left lower leg.  She states that she bumped her leg on the edge of her walker last night.  She states that the nurse came in cleaned it and put a bandage on it.  She is unsure of her last tetanus.    Past Medical History:  Diagnosis Date  . Arthritis    osteo  . COPD (chronic obstructive pulmonary disease) ([place])   . GERD (gastroesophageal reflux disease)   . Heart murmur   . Hernia   . Hyperlipidemia   . Hypertension   . Melanoma ([place])    face  . Syncope and collapse   . Urine incontinence     Patient Active Problem List   Diagnosis Date Noted  . Hyponatremia 11/24/2017  . Headache 11/24/2017  . Wound of left leg 11/01/2017  . Constipation 09/05/2017  . Other fatigue 12/22/2016  . Tremor 03/29/2016  . Amaurosis fugax 12/25/2015  . Asthma 09/17/2015  . PVD (peripheral vascular disease) ([place]) 05/28/2015  . B12 deficiency 08/29/2014  . Chronic back pain greater than 3 months duration 04/23/2013  . Chronic kidney disease (CKD), stage III (moderate) ([place]) 04/12/2012  . Hyperlipidemia LDL goal < 100 03/05/2012  . UTI (urinary tract infection) 07/26/2011  . Osteoarthritis 07/06/2011  . Hypertension 02/21/2011  . Depression/Anxiety 02/21/2011    Past Surgical History:  Procedure Laterality Date  . ABDOMINAL HYSTERECTOMY    . CHOLECYSTECTOMY    . HERNIA REPAIR      Prior to Admission medications   Medication Sig Start Date End Date Taking? Authorizing Provider  amLODipine (NORVASC) 5 MG tablet Take 1 tablet (5 mg total) by mouth daily. 12/07/17   Leone Haven,  MD  aspirin 81 MG tablet Take 81 mg by mouth daily.      [provider]  Biotin 1000 MCG tablet Take 1,000 mcg by mouth daily.      [provider]  cholecalciferol (VITAMIN D) 400 UNITS TABS Take 400 Units by mouth 2 (two) times daily.      [provider]  enalapril (VASOTEC) 20 MG tablet TAKE ONE TABLET BY MOUTH EVERY DAY 09/15/17   Leone Haven, MD  Flaxseed, Linseed, (FLAX SEED OIL) 1000 MG CAPS Take 1 capsule by mouth daily.      [provider]  fluticasone (FLOVENT HFA) 220 MCG/ACT inhaler Inhale 2 puffs into the lungs 2 (two) times daily. 08/12/17   Leone Haven, MD  gabapentin (NEURONTIN) 300 MG capsule Take 1 capsule (300 mg total) by mouth at bedtime. 12/06/17   Leone Haven, MD  lactulose (CHRONULAC) 10 GM/15ML solution Take 30 mLs (20 g total) by mouth daily as needed for mild constipation. 12/06/17   Leone Haven, MD  Multiple Vitamin (MULTIVITAMIN) capsule Take 1 capsule by mouth daily.      [provider]  mupirocin cream (BACTROBAN) 2 % Apply 1 application topically 3 (three) times daily. 11/01/17   Burnard Hawthorne, FNP  nitroGLYCERIN (NITROSTAT) 0.3 MG SL tablet DISSOLVE 1 TABLET UNDER TONGUE EVERY 5 MINUTES FOR 3 DOSES AS NEEDED FOR CHEST PAIN.  IF NO RELIEF GO TO ER. 01/08/16   Leone Haven, MD  NUCYNTA 50 MG TABS tablet Take 50 mg by mouth 2 (two) times daily as needed (Take 1 tablet twice a day as needed).  04/12/13   [provider]  nystatin cream (MYCOSTATIN) Apply 1 application topically 2 (two) times daily. 07/10/17   Leone Haven, MD  omeprazole (PRILOSEC) 20 MG capsule TAKE 1 CAPSULE BY MOUTH EVERY DAY. 11/15/17   Leone Haven, MD  oxybutynin (DITROPAN) 5 MG tablet TAKE ONE TABLET BY MOUTH EVERY DAY 09/15/17   Leone Haven, MD  propranolol ER (INDERAL LA) 60 MG 24 hr capsule TAKE 1 CAPSULE BY MOUTH EVERY DAY 01/13/17   Thersa Salt G, DO  sertraline (ZOLOFT) 100 MG tablet TAKE  ONE TABLET BY MOUTH EVERY DAY 11/15/17   Leone Haven, MD  verapamil (CALAN-SR) 180 MG CR tablet Take 1 tablet (180 mg total) by mouth at bedtime. 07/10/17   Leone Haven, MD    Allergies Statins; Tramadol; Levaquin [levofloxacin in d5w]; Neomycin-polymyxin-gramicidin; Penicillins cross reactors; Sulfa antibiotics; and Tetracyclines & related  Family History  Problem Relation Age of Onset  . Stroke Father   . Heart disease Sister   . Cancer Brother        colon  . Heart disease Brother   . Cancer Sister        colon  . Heart attack Brother   . Heart attack Son   . Parkinson's disease Son     Social History Social History   Tobacco Use  . Smoking status: Never Smoker  . Smokeless tobacco: Never Used  Substance Use Topics  . Alcohol use: No  . Drug use: No    Review of Systems  Constitutional: No fever/chills Eyes: No visual changes. ENT: No sore throat. Respiratory: Denies cough Genitourinary: Negative for dysuria. Musculoskeletal: Negative for back pain. Skin: Negative for rash.  Positive for skin tear to the left lower leg    ____________________________________________   PHYSICAL EXAM:  VITAL SIGNS: ED Triage Vitals  Enc Vitals Group     BP 02/11/18 1101 (!) 138/51     Pulse Rate 02/11/18 1101 70     Resp 02/11/18 1101 20     Temp 02/11/18 1101 97.6 F (36.4 C)     Temp Source 02/11/18 1101 Oral     SpO2 02/11/18 1101 95 %     Weight 02/11/18 1101 170 lb (77.1 kg)     Height 02/11/18 1101 5' 3.5" (1.613 m)     Head Circumference --      Peak Flow --      Pain Score 02/11/18 1108 2     Pain Loc --      Pain Edu? --      Excl. in [place]? --     Constitutional: Alert and oriented. Well appearing and in no acute distress. Eyes: Conjunctivae are normal.  Head: Atraumatic. Nose: No congestion/rhinnorhea. Mouth/Throat: Mucous membranes are moist.   Neck:  supple no lymphadenopathy noted Cardiovascular: Normal rate, regular rhythm.    Respiratory: Normal respiratory effort.  No retractions GU: deferred Musculoskeletal: FROM all extremities, warm and well perfused.  No bony tenderness is noted.  The left lower leg has a flap-like skin tear in the shape of the left angle.  No foreign bodies noted.  The area is not actively bleeding. Neurologic:  Normal speech and language.  Skin:  Skin is warm, dry, positive for skin  tear. No rash noted. Psychiatric: Mood and affect are normal. Speech and behavior are normal.  ____________________________________________   LABS (all labs ordered are listed, but only abnormal results are displayed)  Labs Reviewed - No data to display ____________________________________________   ____________________________________________  RADIOLOGY    ____________________________________________   PROCEDURES  Procedure(s) performed: The left lower leg skin tear was repaired by rolling the skin out and applying Steri-Strips to anchor the skin.  Dermabond was applied around the edges.  Patient tolerated procedure well.  Procedures    ____________________________________________   INITIAL IMPRESSION / ASSESSMENT AND PLAN / ED COURSE  Pertinent labs & imaging results that were available during my care of the patient were reviewed by me and considered in my medical decision making (see chart for details).   Patient is a 84 year old female presents emergency department for skin tear to the left lower leg.  On physical exam the left lower leg has a 2 inch x 2 inch left ankle shaped skin tear.  No active bleeding is noted.  The area is clean  The area was repaired Steri-Strips and Dermabond.  Patient was given a tetanus. Dressing was applied after the Dermabond dried by nursing staff.  Discharge instructions were given to the patient and were written as information for the patient.  Nursing home.  Her son is with her and will comply the instructions.  The patient was discharged in stable  condition in the care of her son.     As part of my medical decision making, I reviewed the following data within the [place] History obtained from family, Nursing notes reviewed and incorporated, Old chart reviewed, Notes from prior ED visits and [place] Controlled Substance Database  ____________________________________________   FINAL CLINICAL IMPRESSION(S) / ED DIAGNOSES  Final diagnoses:  Skin tear of left lower leg without complication, initial encounter      NEW MEDICATIONS STARTED DURING THIS VISIT:  Discharge Medication List as of 02/11/2018 12:24 PM       Note:  This document was prepared using Dragon voice recognition software and may include unintentional dictation errors.    Versie Starks, PA-C 02/11/18 1533    Merlyn Lot, MD 02/16/18 1242

## 2018-02-12 DIAGNOSIS — R609 Edema, unspecified: Secondary | ICD-10-CM | POA: Diagnosis not present

## 2018-02-12 DIAGNOSIS — M6281 Muscle weakness (generalized): Secondary | ICD-10-CM | POA: Diagnosis not present

## 2018-02-12 DIAGNOSIS — S81812D Laceration without foreign body, left lower leg, subsequent encounter: Secondary | ICD-10-CM | POA: Diagnosis not present

## 2018-02-12 DIAGNOSIS — R2681 Unsteadiness on feet: Secondary | ICD-10-CM | POA: Diagnosis not present

## 2018-02-12 DIAGNOSIS — I10 Essential (primary) hypertension: Secondary | ICD-10-CM | POA: Diagnosis not present

## 2018-02-12 DIAGNOSIS — M1990 Unspecified osteoarthritis, unspecified site: Secondary | ICD-10-CM | POA: Diagnosis not present

## 2018-02-13 DIAGNOSIS — N183 Chronic kidney disease, stage 3 (moderate): Secondary | ICD-10-CM | POA: Diagnosis not present

## 2018-02-13 DIAGNOSIS — M6281 Muscle weakness (generalized): Secondary | ICD-10-CM | POA: Diagnosis not present

## 2018-02-13 DIAGNOSIS — M545 Low back pain: Secondary | ICD-10-CM | POA: Diagnosis not present

## 2018-02-13 DIAGNOSIS — R2681 Unsteadiness on feet: Secondary | ICD-10-CM | POA: Diagnosis not present

## 2018-02-13 DIAGNOSIS — G8929 Other chronic pain: Secondary | ICD-10-CM | POA: Diagnosis not present

## 2018-02-13 DIAGNOSIS — M7062 Trochanteric bursitis, left hip: Secondary | ICD-10-CM | POA: Diagnosis not present

## 2018-02-13 DIAGNOSIS — M150 Primary generalized (osteo)arthritis: Secondary | ICD-10-CM | POA: Diagnosis not present

## 2018-02-14 DIAGNOSIS — M1990 Unspecified osteoarthritis, unspecified site: Secondary | ICD-10-CM | POA: Diagnosis not present

## 2018-02-14 DIAGNOSIS — I10 Essential (primary) hypertension: Secondary | ICD-10-CM | POA: Diagnosis not present

## 2018-02-14 DIAGNOSIS — R269 Unspecified abnormalities of gait and mobility: Secondary | ICD-10-CM | POA: Diagnosis not present

## 2018-02-14 DIAGNOSIS — S81812S Laceration without foreign body, left lower leg, sequela: Secondary | ICD-10-CM | POA: Diagnosis not present

## 2018-02-16 DIAGNOSIS — M6281 Muscle weakness (generalized): Secondary | ICD-10-CM | POA: Diagnosis not present

## 2018-02-16 DIAGNOSIS — R2681 Unsteadiness on feet: Secondary | ICD-10-CM | POA: Diagnosis not present

## 2018-02-19 DIAGNOSIS — M810 Age-related osteoporosis without current pathological fracture: Secondary | ICD-10-CM | POA: Diagnosis not present

## 2018-02-21 DIAGNOSIS — R21 Rash and other nonspecific skin eruption: Secondary | ICD-10-CM | POA: Diagnosis not present

## 2018-02-21 DIAGNOSIS — M6281 Muscle weakness (generalized): Secondary | ICD-10-CM | POA: Diagnosis not present

## 2018-02-21 DIAGNOSIS — J449 Chronic obstructive pulmonary disease, unspecified: Secondary | ICD-10-CM | POA: Diagnosis not present

## 2018-02-21 DIAGNOSIS — R269 Unspecified abnormalities of gait and mobility: Secondary | ICD-10-CM | POA: Diagnosis not present

## 2018-02-21 DIAGNOSIS — K219 Gastro-esophageal reflux disease without esophagitis: Secondary | ICD-10-CM | POA: Diagnosis not present

## 2018-02-22 DIAGNOSIS — M6281 Muscle weakness (generalized): Secondary | ICD-10-CM | POA: Diagnosis not present

## 2018-02-26 DIAGNOSIS — J449 Chronic obstructive pulmonary disease, unspecified: Secondary | ICD-10-CM | POA: Diagnosis not present

## 2018-02-26 DIAGNOSIS — M6281 Muscle weakness (generalized): Secondary | ICD-10-CM | POA: Diagnosis not present

## 2018-02-26 DIAGNOSIS — R269 Unspecified abnormalities of gait and mobility: Secondary | ICD-10-CM | POA: Diagnosis not present

## 2018-02-26 DIAGNOSIS — G894 Chronic pain syndrome: Secondary | ICD-10-CM | POA: Diagnosis not present

## 2018-02-26 DIAGNOSIS — I129 Hypertensive chronic kidney disease with stage 1 through stage 4 chronic kidney disease, or unspecified chronic kidney disease: Secondary | ICD-10-CM | POA: Diagnosis not present

## 2018-02-28 DIAGNOSIS — E785 Hyperlipidemia, unspecified: Secondary | ICD-10-CM | POA: Diagnosis not present

## 2018-02-28 DIAGNOSIS — R269 Unspecified abnormalities of gait and mobility: Secondary | ICD-10-CM | POA: Diagnosis not present

## 2018-02-28 DIAGNOSIS — I872 Venous insufficiency (chronic) (peripheral): Secondary | ICD-10-CM | POA: Diagnosis not present

## 2018-02-28 DIAGNOSIS — M6281 Muscle weakness (generalized): Secondary | ICD-10-CM | POA: Diagnosis not present

## 2018-02-28 DIAGNOSIS — R21 Rash and other nonspecific skin eruption: Secondary | ICD-10-CM | POA: Diagnosis not present

## 2018-03-01 DIAGNOSIS — M6281 Muscle weakness (generalized): Secondary | ICD-10-CM | POA: Diagnosis not present

## 2018-03-03 DIAGNOSIS — M6281 Muscle weakness (generalized): Secondary | ICD-10-CM | POA: Diagnosis not present

## 2018-03-05 DIAGNOSIS — S81812D Laceration without foreign body, left lower leg, subsequent encounter: Secondary | ICD-10-CM | POA: Diagnosis not present

## 2018-03-05 DIAGNOSIS — I10 Essential (primary) hypertension: Secondary | ICD-10-CM | POA: Diagnosis not present

## 2018-03-05 DIAGNOSIS — L03116 Cellulitis of left lower limb: Secondary | ICD-10-CM | POA: Diagnosis not present

## 2018-03-05 DIAGNOSIS — M159 Polyosteoarthritis, unspecified: Secondary | ICD-10-CM | POA: Diagnosis not present

## 2018-03-07 DIAGNOSIS — M6281 Muscle weakness (generalized): Secondary | ICD-10-CM | POA: Diagnosis not present

## 2018-03-10 DIAGNOSIS — S81812D Laceration without foreign body, left lower leg, subsequent encounter: Secondary | ICD-10-CM | POA: Diagnosis not present

## 2018-03-10 DIAGNOSIS — J449 Chronic obstructive pulmonary disease, unspecified: Secondary | ICD-10-CM | POA: Diagnosis not present

## 2018-03-10 DIAGNOSIS — I129 Hypertensive chronic kidney disease with stage 1 through stage 4 chronic kidney disease, or unspecified chronic kidney disease: Secondary | ICD-10-CM | POA: Diagnosis not present

## 2018-03-10 DIAGNOSIS — F419 Anxiety disorder, unspecified: Secondary | ICD-10-CM | POA: Diagnosis not present

## 2018-03-10 DIAGNOSIS — Z7982 Long term (current) use of aspirin: Secondary | ICD-10-CM | POA: Diagnosis not present

## 2018-03-10 DIAGNOSIS — E785 Hyperlipidemia, unspecified: Secondary | ICD-10-CM | POA: Diagnosis not present

## 2018-03-10 DIAGNOSIS — F329 Major depressive disorder, single episode, unspecified: Secondary | ICD-10-CM | POA: Diagnosis not present

## 2018-03-10 DIAGNOSIS — N183 Chronic kidney disease, stage 3 (moderate): Secondary | ICD-10-CM | POA: Diagnosis not present

## 2018-03-10 DIAGNOSIS — I252 Old myocardial infarction: Secondary | ICD-10-CM | POA: Diagnosis not present

## 2018-03-10 DIAGNOSIS — M1991 Primary osteoarthritis, unspecified site: Secondary | ICD-10-CM | POA: Diagnosis not present

## 2018-03-10 DIAGNOSIS — K219 Gastro-esophageal reflux disease without esophagitis: Secondary | ICD-10-CM | POA: Diagnosis not present

## 2018-03-10 DIAGNOSIS — Z85820 Personal history of malignant melanoma of skin: Secondary | ICD-10-CM | POA: Diagnosis not present

## 2018-03-10 DIAGNOSIS — Z9181 History of falling: Secondary | ICD-10-CM | POA: Diagnosis not present

## 2018-03-10 DIAGNOSIS — I739 Peripheral vascular disease, unspecified: Secondary | ICD-10-CM | POA: Diagnosis not present

## 2018-03-10 DIAGNOSIS — R32 Unspecified urinary incontinence: Secondary | ICD-10-CM | POA: Diagnosis not present

## 2018-03-12 ENCOUNTER — Ambulatory Visit: Payer: Medicare Other | Admitting: Family Medicine

## 2018-03-14 DIAGNOSIS — I129 Hypertensive chronic kidney disease with stage 1 through stage 4 chronic kidney disease, or unspecified chronic kidney disease: Secondary | ICD-10-CM | POA: Diagnosis not present

## 2018-03-14 DIAGNOSIS — M1991 Primary osteoarthritis, unspecified site: Secondary | ICD-10-CM | POA: Diagnosis not present

## 2018-03-14 DIAGNOSIS — I739 Peripheral vascular disease, unspecified: Secondary | ICD-10-CM | POA: Diagnosis not present

## 2018-03-14 DIAGNOSIS — N183 Chronic kidney disease, stage 3 (moderate): Secondary | ICD-10-CM | POA: Diagnosis not present

## 2018-03-14 DIAGNOSIS — J449 Chronic obstructive pulmonary disease, unspecified: Secondary | ICD-10-CM | POA: Diagnosis not present

## 2018-03-14 DIAGNOSIS — S81812D Laceration without foreign body, left lower leg, subsequent encounter: Secondary | ICD-10-CM | POA: Diagnosis not present

## 2018-03-16 DIAGNOSIS — J449 Chronic obstructive pulmonary disease, unspecified: Secondary | ICD-10-CM | POA: Diagnosis not present

## 2018-03-16 DIAGNOSIS — N183 Chronic kidney disease, stage 3 (moderate): Secondary | ICD-10-CM | POA: Diagnosis not present

## 2018-03-16 DIAGNOSIS — I129 Hypertensive chronic kidney disease with stage 1 through stage 4 chronic kidney disease, or unspecified chronic kidney disease: Secondary | ICD-10-CM | POA: Diagnosis not present

## 2018-03-16 DIAGNOSIS — M1991 Primary osteoarthritis, unspecified site: Secondary | ICD-10-CM | POA: Diagnosis not present

## 2018-03-16 DIAGNOSIS — S81812D Laceration without foreign body, left lower leg, subsequent encounter: Secondary | ICD-10-CM | POA: Diagnosis not present

## 2018-03-16 DIAGNOSIS — I739 Peripheral vascular disease, unspecified: Secondary | ICD-10-CM | POA: Diagnosis not present

## 2018-03-17 DIAGNOSIS — Z23 Encounter for immunization: Secondary | ICD-10-CM | POA: Diagnosis not present

## 2018-03-19 DIAGNOSIS — M5136 Other intervertebral disc degeneration, lumbar region: Secondary | ICD-10-CM | POA: Diagnosis not present

## 2018-03-19 DIAGNOSIS — M1991 Primary osteoarthritis, unspecified site: Secondary | ICD-10-CM | POA: Diagnosis not present

## 2018-03-19 DIAGNOSIS — I129 Hypertensive chronic kidney disease with stage 1 through stage 4 chronic kidney disease, or unspecified chronic kidney disease: Secondary | ICD-10-CM | POA: Diagnosis not present

## 2018-03-19 DIAGNOSIS — M5416 Radiculopathy, lumbar region: Secondary | ICD-10-CM | POA: Diagnosis not present

## 2018-03-19 DIAGNOSIS — J449 Chronic obstructive pulmonary disease, unspecified: Secondary | ICD-10-CM | POA: Diagnosis not present

## 2018-03-19 DIAGNOSIS — N183 Chronic kidney disease, stage 3 (moderate): Secondary | ICD-10-CM | POA: Diagnosis not present

## 2018-03-19 DIAGNOSIS — I739 Peripheral vascular disease, unspecified: Secondary | ICD-10-CM | POA: Diagnosis not present

## 2018-03-19 DIAGNOSIS — S81812D Laceration without foreign body, left lower leg, subsequent encounter: Secondary | ICD-10-CM | POA: Diagnosis not present

## 2018-03-22 ENCOUNTER — Telehealth: Payer: Self-pay | Admitting: Family Medicine

## 2018-03-22 DIAGNOSIS — M1991 Primary osteoarthritis, unspecified site: Secondary | ICD-10-CM | POA: Diagnosis not present

## 2018-03-22 DIAGNOSIS — J449 Chronic obstructive pulmonary disease, unspecified: Secondary | ICD-10-CM | POA: Diagnosis not present

## 2018-03-22 DIAGNOSIS — I129 Hypertensive chronic kidney disease with stage 1 through stage 4 chronic kidney disease, or unspecified chronic kidney disease: Secondary | ICD-10-CM | POA: Diagnosis not present

## 2018-03-22 DIAGNOSIS — S81812D Laceration without foreign body, left lower leg, subsequent encounter: Secondary | ICD-10-CM | POA: Diagnosis not present

## 2018-03-22 DIAGNOSIS — I739 Peripheral vascular disease, unspecified: Secondary | ICD-10-CM | POA: Diagnosis not present

## 2018-03-22 DIAGNOSIS — N183 Chronic kidney disease, stage 3 (moderate): Secondary | ICD-10-CM | POA: Diagnosis not present

## 2018-03-22 NOTE — Telephone Encounter (Signed)
Called and spoke with patient's daughter in-law. She is NOT listed on patient DPR. Asked her to inform the patient's son who is on the patient DPR to give [place] a call back. CRM created and sent to [facility] pool.

## 2018-03-22 NOTE — Telephone Encounter (Signed)
Please contact the patient and let her know that I received a request from [facility] regarding oxygen therapy and equipment.  It does not appear that we have discussed this in the last 6 months and they require that.  Please see if we can get her set up for a follow-up appointment to discuss her oxygen therapy.  It would be okay to schedule her for a 430 appointment. Thanks.

## 2018-03-23 NOTE — Telephone Encounter (Signed)
Pt's son call back who is listed on the patient's DPR. He stated that he is unsure if this is needed or not will speak with his mother and will get back to [place] since his mother is currently living in a nursing home unsure if this is something they would handle there.   Will try to call son again later if I do NOT hear back from him in regards to their choice.

## 2018-03-26 DIAGNOSIS — G894 Chronic pain syndrome: Secondary | ICD-10-CM | POA: Diagnosis not present

## 2018-03-26 DIAGNOSIS — I739 Peripheral vascular disease, unspecified: Secondary | ICD-10-CM | POA: Diagnosis not present

## 2018-03-26 DIAGNOSIS — S81812D Laceration without foreign body, left lower leg, subsequent encounter: Secondary | ICD-10-CM | POA: Diagnosis not present

## 2018-03-26 DIAGNOSIS — J449 Chronic obstructive pulmonary disease, unspecified: Secondary | ICD-10-CM | POA: Diagnosis not present

## 2018-03-26 DIAGNOSIS — I10 Essential (primary) hypertension: Secondary | ICD-10-CM | POA: Diagnosis not present

## 2018-03-26 DIAGNOSIS — I129 Hypertensive chronic kidney disease with stage 1 through stage 4 chronic kidney disease, or unspecified chronic kidney disease: Secondary | ICD-10-CM | POA: Diagnosis not present

## 2018-03-26 DIAGNOSIS — M1991 Primary osteoarthritis, unspecified site: Secondary | ICD-10-CM | POA: Diagnosis not present

## 2018-03-26 DIAGNOSIS — R269 Unspecified abnormalities of gait and mobility: Secondary | ICD-10-CM | POA: Diagnosis not present

## 2018-03-26 DIAGNOSIS — N183 Chronic kidney disease, stage 3 (moderate): Secondary | ICD-10-CM | POA: Diagnosis not present

## 2018-03-26 NOTE — Telephone Encounter (Signed)
Called pt's son and left a VM to call back.

## 2018-03-29 DIAGNOSIS — I129 Hypertensive chronic kidney disease with stage 1 through stage 4 chronic kidney disease, or unspecified chronic kidney disease: Secondary | ICD-10-CM | POA: Diagnosis not present

## 2018-03-29 DIAGNOSIS — M1991 Primary osteoarthritis, unspecified site: Secondary | ICD-10-CM | POA: Diagnosis not present

## 2018-03-29 DIAGNOSIS — N183 Chronic kidney disease, stage 3 (moderate): Secondary | ICD-10-CM | POA: Diagnosis not present

## 2018-03-29 DIAGNOSIS — J449 Chronic obstructive pulmonary disease, unspecified: Secondary | ICD-10-CM | POA: Diagnosis not present

## 2018-03-29 DIAGNOSIS — I739 Peripheral vascular disease, unspecified: Secondary | ICD-10-CM | POA: Diagnosis not present

## 2018-03-29 DIAGNOSIS — S81812D Laceration without foreign body, left lower leg, subsequent encounter: Secondary | ICD-10-CM | POA: Diagnosis not present

## 2018-03-29 NOTE — Telephone Encounter (Signed)
Called and spoke with patients son. Son stated that she has the oxygen tanks and stuff to use the oxygen machine however, she has been fine with out using it and has not had to use it. Son doesn't feel that this is needed.

## 2018-03-30 NOTE — Telephone Encounter (Signed)
If the patient is living in a skilled nursing facility they should be able to take care of this there. She should have a physician following her there that can complete any forms or visits that would be needed for this. Thanks.

## 2018-04-02 DIAGNOSIS — S81812D Laceration without foreign body, left lower leg, subsequent encounter: Secondary | ICD-10-CM | POA: Diagnosis not present

## 2018-04-02 DIAGNOSIS — J441 Chronic obstructive pulmonary disease with (acute) exacerbation: Secondary | ICD-10-CM | POA: Diagnosis not present

## 2018-04-02 DIAGNOSIS — M1990 Unspecified osteoarthritis, unspecified site: Secondary | ICD-10-CM | POA: Diagnosis not present

## 2018-04-02 DIAGNOSIS — J449 Chronic obstructive pulmonary disease, unspecified: Secondary | ICD-10-CM | POA: Diagnosis not present

## 2018-04-02 DIAGNOSIS — M1991 Primary osteoarthritis, unspecified site: Secondary | ICD-10-CM | POA: Diagnosis not present

## 2018-04-02 DIAGNOSIS — R05 Cough: Secondary | ICD-10-CM | POA: Diagnosis not present

## 2018-04-02 DIAGNOSIS — N183 Chronic kidney disease, stage 3 (moderate): Secondary | ICD-10-CM | POA: Diagnosis not present

## 2018-04-02 DIAGNOSIS — I129 Hypertensive chronic kidney disease with stage 1 through stage 4 chronic kidney disease, or unspecified chronic kidney disease: Secondary | ICD-10-CM | POA: Diagnosis not present

## 2018-04-02 DIAGNOSIS — R0902 Hypoxemia: Secondary | ICD-10-CM | POA: Diagnosis not present

## 2018-04-02 DIAGNOSIS — J189 Pneumonia, unspecified organism: Secondary | ICD-10-CM | POA: Diagnosis not present

## 2018-04-02 DIAGNOSIS — R0989 Other specified symptoms and signs involving the circulatory and respiratory systems: Secondary | ICD-10-CM | POA: Diagnosis not present

## 2018-04-02 DIAGNOSIS — I739 Peripheral vascular disease, unspecified: Secondary | ICD-10-CM | POA: Diagnosis not present

## 2018-04-03 DIAGNOSIS — Z79899 Other long term (current) drug therapy: Secondary | ICD-10-CM | POA: Diagnosis not present

## 2018-04-05 DIAGNOSIS — M1991 Primary osteoarthritis, unspecified site: Secondary | ICD-10-CM | POA: Diagnosis not present

## 2018-04-05 DIAGNOSIS — N183 Chronic kidney disease, stage 3 (moderate): Secondary | ICD-10-CM | POA: Diagnosis not present

## 2018-04-05 DIAGNOSIS — M2011 Hallux valgus (acquired), right foot: Secondary | ICD-10-CM | POA: Diagnosis not present

## 2018-04-05 DIAGNOSIS — L84 Corns and callosities: Secondary | ICD-10-CM | POA: Diagnosis not present

## 2018-04-05 DIAGNOSIS — L603 Nail dystrophy: Secondary | ICD-10-CM | POA: Diagnosis not present

## 2018-04-05 DIAGNOSIS — M2012 Hallux valgus (acquired), left foot: Secondary | ICD-10-CM | POA: Diagnosis not present

## 2018-04-05 DIAGNOSIS — J449 Chronic obstructive pulmonary disease, unspecified: Secondary | ICD-10-CM | POA: Diagnosis not present

## 2018-04-05 DIAGNOSIS — I739 Peripheral vascular disease, unspecified: Secondary | ICD-10-CM | POA: Diagnosis not present

## 2018-04-05 DIAGNOSIS — S81812D Laceration without foreign body, left lower leg, subsequent encounter: Secondary | ICD-10-CM | POA: Diagnosis not present

## 2018-04-05 DIAGNOSIS — I129 Hypertensive chronic kidney disease with stage 1 through stage 4 chronic kidney disease, or unspecified chronic kidney disease: Secondary | ICD-10-CM | POA: Diagnosis not present

## 2018-04-05 DIAGNOSIS — L97512 Non-pressure chronic ulcer of other part of right foot with fat layer exposed: Secondary | ICD-10-CM | POA: Diagnosis not present

## 2018-04-09 DIAGNOSIS — J449 Chronic obstructive pulmonary disease, unspecified: Secondary | ICD-10-CM | POA: Diagnosis not present

## 2018-04-09 DIAGNOSIS — M1991 Primary osteoarthritis, unspecified site: Secondary | ICD-10-CM | POA: Diagnosis not present

## 2018-04-09 DIAGNOSIS — I129 Hypertensive chronic kidney disease with stage 1 through stage 4 chronic kidney disease, or unspecified chronic kidney disease: Secondary | ICD-10-CM | POA: Diagnosis not present

## 2018-04-09 DIAGNOSIS — S81812D Laceration without foreign body, left lower leg, subsequent encounter: Secondary | ICD-10-CM | POA: Diagnosis not present

## 2018-04-09 DIAGNOSIS — N183 Chronic kidney disease, stage 3 (moderate): Secondary | ICD-10-CM | POA: Diagnosis not present

## 2018-04-09 DIAGNOSIS — I739 Peripheral vascular disease, unspecified: Secondary | ICD-10-CM | POA: Diagnosis not present

## 2018-04-16 DIAGNOSIS — M1991 Primary osteoarthritis, unspecified site: Secondary | ICD-10-CM | POA: Diagnosis not present

## 2018-04-16 DIAGNOSIS — S81812D Laceration without foreign body, left lower leg, subsequent encounter: Secondary | ICD-10-CM | POA: Diagnosis not present

## 2018-04-16 DIAGNOSIS — I10 Essential (primary) hypertension: Secondary | ICD-10-CM | POA: Diagnosis not present

## 2018-04-16 DIAGNOSIS — R269 Unspecified abnormalities of gait and mobility: Secondary | ICD-10-CM | POA: Diagnosis not present

## 2018-04-16 DIAGNOSIS — I129 Hypertensive chronic kidney disease with stage 1 through stage 4 chronic kidney disease, or unspecified chronic kidney disease: Secondary | ICD-10-CM | POA: Diagnosis not present

## 2018-04-16 DIAGNOSIS — N183 Chronic kidney disease, stage 3 (moderate): Secondary | ICD-10-CM | POA: Diagnosis not present

## 2018-04-16 DIAGNOSIS — L03039 Cellulitis of unspecified toe: Secondary | ICD-10-CM | POA: Diagnosis not present

## 2018-04-16 DIAGNOSIS — I739 Peripheral vascular disease, unspecified: Secondary | ICD-10-CM | POA: Diagnosis not present

## 2018-04-16 DIAGNOSIS — M1990 Unspecified osteoarthritis, unspecified site: Secondary | ICD-10-CM | POA: Diagnosis not present

## 2018-04-16 DIAGNOSIS — J449 Chronic obstructive pulmonary disease, unspecified: Secondary | ICD-10-CM | POA: Diagnosis not present

## 2018-04-16 DIAGNOSIS — L89890 Pressure ulcer of other site, unstageable: Secondary | ICD-10-CM | POA: Diagnosis not present

## 2018-04-17 DIAGNOSIS — I10 Essential (primary) hypertension: Secondary | ICD-10-CM | POA: Diagnosis not present

## 2018-04-27 DIAGNOSIS — J449 Chronic obstructive pulmonary disease, unspecified: Secondary | ICD-10-CM | POA: Diagnosis not present

## 2018-04-27 DIAGNOSIS — S81812D Laceration without foreign body, left lower leg, subsequent encounter: Secondary | ICD-10-CM | POA: Diagnosis not present

## 2018-04-27 DIAGNOSIS — M1991 Primary osteoarthritis, unspecified site: Secondary | ICD-10-CM | POA: Diagnosis not present

## 2018-04-27 DIAGNOSIS — I739 Peripheral vascular disease, unspecified: Secondary | ICD-10-CM | POA: Diagnosis not present

## 2018-04-27 DIAGNOSIS — N183 Chronic kidney disease, stage 3 (moderate): Secondary | ICD-10-CM | POA: Diagnosis not present

## 2018-04-27 DIAGNOSIS — I129 Hypertensive chronic kidney disease with stage 1 through stage 4 chronic kidney disease, or unspecified chronic kidney disease: Secondary | ICD-10-CM | POA: Diagnosis not present

## 2018-04-30 DIAGNOSIS — I739 Peripheral vascular disease, unspecified: Secondary | ICD-10-CM | POA: Diagnosis not present

## 2018-04-30 DIAGNOSIS — S81812D Laceration without foreign body, left lower leg, subsequent encounter: Secondary | ICD-10-CM | POA: Diagnosis not present

## 2018-04-30 DIAGNOSIS — R269 Unspecified abnormalities of gait and mobility: Secondary | ICD-10-CM | POA: Diagnosis not present

## 2018-04-30 DIAGNOSIS — I129 Hypertensive chronic kidney disease with stage 1 through stage 4 chronic kidney disease, or unspecified chronic kidney disease: Secondary | ICD-10-CM | POA: Diagnosis not present

## 2018-04-30 DIAGNOSIS — J449 Chronic obstructive pulmonary disease, unspecified: Secondary | ICD-10-CM | POA: Diagnosis not present

## 2018-04-30 DIAGNOSIS — M1991 Primary osteoarthritis, unspecified site: Secondary | ICD-10-CM | POA: Diagnosis not present

## 2018-04-30 DIAGNOSIS — M1990 Unspecified osteoarthritis, unspecified site: Secondary | ICD-10-CM | POA: Diagnosis not present

## 2018-04-30 DIAGNOSIS — N183 Chronic kidney disease, stage 3 (moderate): Secondary | ICD-10-CM | POA: Diagnosis not present

## 2018-04-30 DIAGNOSIS — I10 Essential (primary) hypertension: Secondary | ICD-10-CM | POA: Diagnosis not present

## 2018-05-03 DIAGNOSIS — J449 Chronic obstructive pulmonary disease, unspecified: Secondary | ICD-10-CM | POA: Diagnosis not present

## 2018-05-03 DIAGNOSIS — N183 Chronic kidney disease, stage 3 (moderate): Secondary | ICD-10-CM | POA: Diagnosis not present

## 2018-05-03 DIAGNOSIS — E785 Hyperlipidemia, unspecified: Secondary | ICD-10-CM | POA: Diagnosis not present

## 2018-05-03 DIAGNOSIS — R269 Unspecified abnormalities of gait and mobility: Secondary | ICD-10-CM | POA: Diagnosis not present

## 2018-05-03 DIAGNOSIS — M1991 Primary osteoarthritis, unspecified site: Secondary | ICD-10-CM | POA: Diagnosis not present

## 2018-05-03 DIAGNOSIS — S81812D Laceration without foreign body, left lower leg, subsequent encounter: Secondary | ICD-10-CM | POA: Diagnosis not present

## 2018-05-03 DIAGNOSIS — R21 Rash and other nonspecific skin eruption: Secondary | ICD-10-CM | POA: Diagnosis not present

## 2018-05-03 DIAGNOSIS — I129 Hypertensive chronic kidney disease with stage 1 through stage 4 chronic kidney disease, or unspecified chronic kidney disease: Secondary | ICD-10-CM | POA: Diagnosis not present

## 2018-05-03 DIAGNOSIS — I739 Peripheral vascular disease, unspecified: Secondary | ICD-10-CM | POA: Diagnosis not present

## 2018-05-03 DIAGNOSIS — M1990 Unspecified osteoarthritis, unspecified site: Secondary | ICD-10-CM | POA: Diagnosis not present

## 2018-05-08 DIAGNOSIS — M1991 Primary osteoarthritis, unspecified site: Secondary | ICD-10-CM | POA: Diagnosis not present

## 2018-05-08 DIAGNOSIS — J449 Chronic obstructive pulmonary disease, unspecified: Secondary | ICD-10-CM | POA: Diagnosis not present

## 2018-05-08 DIAGNOSIS — I129 Hypertensive chronic kidney disease with stage 1 through stage 4 chronic kidney disease, or unspecified chronic kidney disease: Secondary | ICD-10-CM | POA: Diagnosis not present

## 2018-05-08 DIAGNOSIS — S81812D Laceration without foreign body, left lower leg, subsequent encounter: Secondary | ICD-10-CM | POA: Diagnosis not present

## 2018-05-08 DIAGNOSIS — N183 Chronic kidney disease, stage 3 (moderate): Secondary | ICD-10-CM | POA: Diagnosis not present

## 2018-05-08 DIAGNOSIS — I739 Peripheral vascular disease, unspecified: Secondary | ICD-10-CM | POA: Diagnosis not present

## 2018-05-09 DIAGNOSIS — I129 Hypertensive chronic kidney disease with stage 1 through stage 4 chronic kidney disease, or unspecified chronic kidney disease: Secondary | ICD-10-CM | POA: Diagnosis not present

## 2018-05-09 DIAGNOSIS — I739 Peripheral vascular disease, unspecified: Secondary | ICD-10-CM | POA: Diagnosis not present

## 2018-05-09 DIAGNOSIS — Z85820 Personal history of malignant melanoma of skin: Secondary | ICD-10-CM | POA: Diagnosis not present

## 2018-05-09 DIAGNOSIS — J449 Chronic obstructive pulmonary disease, unspecified: Secondary | ICD-10-CM | POA: Diagnosis not present

## 2018-05-09 DIAGNOSIS — L89892 Pressure ulcer of other site, stage 2: Secondary | ICD-10-CM | POA: Diagnosis not present

## 2018-05-09 DIAGNOSIS — K219 Gastro-esophageal reflux disease without esophagitis: Secondary | ICD-10-CM | POA: Diagnosis not present

## 2018-05-09 DIAGNOSIS — N183 Chronic kidney disease, stage 3 (moderate): Secondary | ICD-10-CM | POA: Diagnosis not present

## 2018-05-09 DIAGNOSIS — F329 Major depressive disorder, single episode, unspecified: Secondary | ICD-10-CM | POA: Diagnosis not present

## 2018-05-09 DIAGNOSIS — I252 Old myocardial infarction: Secondary | ICD-10-CM | POA: Diagnosis not present

## 2018-05-14 DIAGNOSIS — I252 Old myocardial infarction: Secondary | ICD-10-CM | POA: Diagnosis not present

## 2018-05-14 DIAGNOSIS — I739 Peripheral vascular disease, unspecified: Secondary | ICD-10-CM | POA: Diagnosis not present

## 2018-05-14 DIAGNOSIS — L89892 Pressure ulcer of other site, stage 2: Secondary | ICD-10-CM | POA: Diagnosis not present

## 2018-05-14 DIAGNOSIS — N183 Chronic kidney disease, stage 3 (moderate): Secondary | ICD-10-CM | POA: Diagnosis not present

## 2018-05-14 DIAGNOSIS — J449 Chronic obstructive pulmonary disease, unspecified: Secondary | ICD-10-CM | POA: Diagnosis not present

## 2018-05-14 DIAGNOSIS — I129 Hypertensive chronic kidney disease with stage 1 through stage 4 chronic kidney disease, or unspecified chronic kidney disease: Secondary | ICD-10-CM | POA: Diagnosis not present

## 2018-05-17 DIAGNOSIS — I129 Hypertensive chronic kidney disease with stage 1 through stage 4 chronic kidney disease, or unspecified chronic kidney disease: Secondary | ICD-10-CM | POA: Diagnosis not present

## 2018-05-17 DIAGNOSIS — I252 Old myocardial infarction: Secondary | ICD-10-CM | POA: Diagnosis not present

## 2018-05-17 DIAGNOSIS — N183 Chronic kidney disease, stage 3 (moderate): Secondary | ICD-10-CM | POA: Diagnosis not present

## 2018-05-17 DIAGNOSIS — J449 Chronic obstructive pulmonary disease, unspecified: Secondary | ICD-10-CM | POA: Diagnosis not present

## 2018-05-17 DIAGNOSIS — L89892 Pressure ulcer of other site, stage 2: Secondary | ICD-10-CM | POA: Diagnosis not present

## 2018-05-17 DIAGNOSIS — I739 Peripheral vascular disease, unspecified: Secondary | ICD-10-CM | POA: Diagnosis not present

## 2018-05-22 DIAGNOSIS — N183 Chronic kidney disease, stage 3 (moderate): Secondary | ICD-10-CM | POA: Diagnosis not present

## 2018-05-22 DIAGNOSIS — I252 Old myocardial infarction: Secondary | ICD-10-CM | POA: Diagnosis not present

## 2018-05-22 DIAGNOSIS — L89892 Pressure ulcer of other site, stage 2: Secondary | ICD-10-CM | POA: Diagnosis not present

## 2018-05-22 DIAGNOSIS — I129 Hypertensive chronic kidney disease with stage 1 through stage 4 chronic kidney disease, or unspecified chronic kidney disease: Secondary | ICD-10-CM | POA: Diagnosis not present

## 2018-05-22 DIAGNOSIS — J449 Chronic obstructive pulmonary disease, unspecified: Secondary | ICD-10-CM | POA: Diagnosis not present

## 2018-05-22 DIAGNOSIS — I739 Peripheral vascular disease, unspecified: Secondary | ICD-10-CM | POA: Diagnosis not present

## 2018-05-24 DIAGNOSIS — I252 Old myocardial infarction: Secondary | ICD-10-CM | POA: Diagnosis not present

## 2018-05-24 DIAGNOSIS — I739 Peripheral vascular disease, unspecified: Secondary | ICD-10-CM | POA: Diagnosis not present

## 2018-05-24 DIAGNOSIS — N183 Chronic kidney disease, stage 3 (moderate): Secondary | ICD-10-CM | POA: Diagnosis not present

## 2018-05-24 DIAGNOSIS — J449 Chronic obstructive pulmonary disease, unspecified: Secondary | ICD-10-CM | POA: Diagnosis not present

## 2018-05-24 DIAGNOSIS — L89892 Pressure ulcer of other site, stage 2: Secondary | ICD-10-CM | POA: Diagnosis not present

## 2018-05-24 DIAGNOSIS — I129 Hypertensive chronic kidney disease with stage 1 through stage 4 chronic kidney disease, or unspecified chronic kidney disease: Secondary | ICD-10-CM | POA: Diagnosis not present

## 2018-05-28 DIAGNOSIS — I252 Old myocardial infarction: Secondary | ICD-10-CM | POA: Diagnosis not present

## 2018-05-28 DIAGNOSIS — I739 Peripheral vascular disease, unspecified: Secondary | ICD-10-CM | POA: Diagnosis not present

## 2018-05-28 DIAGNOSIS — I129 Hypertensive chronic kidney disease with stage 1 through stage 4 chronic kidney disease, or unspecified chronic kidney disease: Secondary | ICD-10-CM | POA: Diagnosis not present

## 2018-05-28 DIAGNOSIS — N183 Chronic kidney disease, stage 3 (moderate): Secondary | ICD-10-CM | POA: Diagnosis not present

## 2018-05-28 DIAGNOSIS — L89892 Pressure ulcer of other site, stage 2: Secondary | ICD-10-CM | POA: Diagnosis not present

## 2018-05-28 DIAGNOSIS — J449 Chronic obstructive pulmonary disease, unspecified: Secondary | ICD-10-CM | POA: Diagnosis not present

## 2018-05-31 DIAGNOSIS — J449 Chronic obstructive pulmonary disease, unspecified: Secondary | ICD-10-CM | POA: Diagnosis not present

## 2018-05-31 DIAGNOSIS — I252 Old myocardial infarction: Secondary | ICD-10-CM | POA: Diagnosis not present

## 2018-05-31 DIAGNOSIS — N183 Chronic kidney disease, stage 3 (moderate): Secondary | ICD-10-CM | POA: Diagnosis not present

## 2018-05-31 DIAGNOSIS — I129 Hypertensive chronic kidney disease with stage 1 through stage 4 chronic kidney disease, or unspecified chronic kidney disease: Secondary | ICD-10-CM | POA: Diagnosis not present

## 2018-05-31 DIAGNOSIS — I739 Peripheral vascular disease, unspecified: Secondary | ICD-10-CM | POA: Diagnosis not present

## 2018-05-31 DIAGNOSIS — L89892 Pressure ulcer of other site, stage 2: Secondary | ICD-10-CM | POA: Diagnosis not present

## 2018-06-01 DIAGNOSIS — M5136 Other intervertebral disc degeneration, lumbar region: Secondary | ICD-10-CM | POA: Diagnosis not present

## 2018-06-01 DIAGNOSIS — M7062 Trochanteric bursitis, left hip: Secondary | ICD-10-CM | POA: Diagnosis not present

## 2018-06-01 DIAGNOSIS — M5416 Radiculopathy, lumbar region: Secondary | ICD-10-CM | POA: Diagnosis not present

## 2018-06-04 DIAGNOSIS — N183 Chronic kidney disease, stage 3 (moderate): Secondary | ICD-10-CM | POA: Diagnosis not present

## 2018-06-04 DIAGNOSIS — L89892 Pressure ulcer of other site, stage 2: Secondary | ICD-10-CM | POA: Diagnosis not present

## 2018-06-04 DIAGNOSIS — I739 Peripheral vascular disease, unspecified: Secondary | ICD-10-CM | POA: Diagnosis not present

## 2018-06-04 DIAGNOSIS — I252 Old myocardial infarction: Secondary | ICD-10-CM | POA: Diagnosis not present

## 2018-06-04 DIAGNOSIS — I129 Hypertensive chronic kidney disease with stage 1 through stage 4 chronic kidney disease, or unspecified chronic kidney disease: Secondary | ICD-10-CM | POA: Diagnosis not present

## 2018-06-04 DIAGNOSIS — J449 Chronic obstructive pulmonary disease, unspecified: Secondary | ICD-10-CM | POA: Diagnosis not present

## 2018-06-06 DIAGNOSIS — L03031 Cellulitis of right toe: Secondary | ICD-10-CM | POA: Diagnosis not present

## 2018-06-06 DIAGNOSIS — J449 Chronic obstructive pulmonary disease, unspecified: Secondary | ICD-10-CM | POA: Diagnosis not present

## 2018-06-06 DIAGNOSIS — J00 Acute nasopharyngitis [common cold]: Secondary | ICD-10-CM | POA: Diagnosis not present

## 2018-06-06 DIAGNOSIS — M1990 Unspecified osteoarthritis, unspecified site: Secondary | ICD-10-CM | POA: Diagnosis not present

## 2018-06-07 DIAGNOSIS — M1990 Unspecified osteoarthritis, unspecified site: Secondary | ICD-10-CM | POA: Diagnosis not present

## 2018-06-07 DIAGNOSIS — N183 Chronic kidney disease, stage 3 (moderate): Secondary | ICD-10-CM | POA: Diagnosis not present

## 2018-06-07 DIAGNOSIS — L89892 Pressure ulcer of other site, stage 2: Secondary | ICD-10-CM | POA: Diagnosis not present

## 2018-06-07 DIAGNOSIS — I129 Hypertensive chronic kidney disease with stage 1 through stage 4 chronic kidney disease, or unspecified chronic kidney disease: Secondary | ICD-10-CM | POA: Diagnosis not present

## 2018-06-07 DIAGNOSIS — I252 Old myocardial infarction: Secondary | ICD-10-CM | POA: Diagnosis not present

## 2018-06-07 DIAGNOSIS — L03031 Cellulitis of right toe: Secondary | ICD-10-CM | POA: Diagnosis not present

## 2018-06-07 DIAGNOSIS — I739 Peripheral vascular disease, unspecified: Secondary | ICD-10-CM | POA: Diagnosis not present

## 2018-06-07 DIAGNOSIS — J449 Chronic obstructive pulmonary disease, unspecified: Secondary | ICD-10-CM | POA: Diagnosis not present

## 2018-06-07 DIAGNOSIS — J00 Acute nasopharyngitis [common cold]: Secondary | ICD-10-CM | POA: Diagnosis not present

## 2018-06-11 DIAGNOSIS — K5903 Drug induced constipation: Secondary | ICD-10-CM | POA: Diagnosis not present

## 2018-06-11 DIAGNOSIS — L03031 Cellulitis of right toe: Secondary | ICD-10-CM | POA: Diagnosis not present

## 2018-06-11 DIAGNOSIS — J00 Acute nasopharyngitis [common cold]: Secondary | ICD-10-CM | POA: Diagnosis not present

## 2018-06-11 DIAGNOSIS — M1990 Unspecified osteoarthritis, unspecified site: Secondary | ICD-10-CM | POA: Diagnosis not present

## 2018-06-12 ENCOUNTER — Other Ambulatory Visit: Payer: Self-pay

## 2018-06-12 ENCOUNTER — Inpatient Hospital Stay
Admission: EM | Admit: 2018-06-12 | Discharge: 2018-06-16 | DRG: 256 | Disposition: A | Discharge status: Skilled Nursing Facility | Payer: Medicare Other | Source: Skilled Nursing Facility | Attending: Internal Medicine | Admitting: Internal Medicine

## 2018-06-12 ENCOUNTER — Emergency Department: Payer: Medicare Other

## 2018-06-12 ENCOUNTER — Encounter: Payer: Self-pay | Admitting: Emergency Medicine

## 2018-06-12 DIAGNOSIS — Z820 Family history of epilepsy and other diseases of the nervous system: Secondary | ICD-10-CM

## 2018-06-12 DIAGNOSIS — Z90710 Acquired absence of both cervix and uterus: Secondary | ICD-10-CM

## 2018-06-12 DIAGNOSIS — I739 Peripheral vascular disease, unspecified: Secondary | ICD-10-CM | POA: Diagnosis not present

## 2018-06-12 DIAGNOSIS — K219 Gastro-esophageal reflux disease without esophagitis: Secondary | ICD-10-CM | POA: Diagnosis present

## 2018-06-12 DIAGNOSIS — S92502A Displaced unspecified fracture of left lesser toe(s), initial encounter for closed fracture: Secondary | ICD-10-CM | POA: Diagnosis not present

## 2018-06-12 DIAGNOSIS — L89892 Pressure ulcer of other site, stage 2: Secondary | ICD-10-CM | POA: Diagnosis not present

## 2018-06-12 DIAGNOSIS — Z8249 Family history of ischemic heart disease and other diseases of the circulatory system: Secondary | ICD-10-CM | POA: Diagnosis not present

## 2018-06-12 DIAGNOSIS — L03031 Cellulitis of right toe: Secondary | ICD-10-CM | POA: Diagnosis present

## 2018-06-12 DIAGNOSIS — Z66 Do not resuscitate: Secondary | ICD-10-CM | POA: Diagnosis present

## 2018-06-12 DIAGNOSIS — Z7951 Long term (current) use of inhaled steroids: Secondary | ICD-10-CM

## 2018-06-12 DIAGNOSIS — I70268 Atherosclerosis of native arteries of extremities with gangrene, other extremity: Secondary | ICD-10-CM | POA: Diagnosis not present

## 2018-06-12 DIAGNOSIS — I96 Gangrene, not elsewhere classified: Secondary | ICD-10-CM | POA: Diagnosis not present

## 2018-06-12 DIAGNOSIS — N183 Chronic kidney disease, stage 3 (moderate): Secondary | ICD-10-CM | POA: Diagnosis present

## 2018-06-12 DIAGNOSIS — E785 Hyperlipidemia, unspecified: Secondary | ICD-10-CM | POA: Diagnosis present

## 2018-06-12 DIAGNOSIS — I252 Old myocardial infarction: Secondary | ICD-10-CM | POA: Diagnosis not present

## 2018-06-12 DIAGNOSIS — L0390 Cellulitis, unspecified: Secondary | ICD-10-CM | POA: Diagnosis not present

## 2018-06-12 DIAGNOSIS — M79672 Pain in left foot: Secondary | ICD-10-CM | POA: Diagnosis not present

## 2018-06-12 DIAGNOSIS — M1990 Unspecified osteoarthritis, unspecified site: Secondary | ICD-10-CM | POA: Diagnosis present

## 2018-06-12 DIAGNOSIS — I10 Essential (primary) hypertension: Secondary | ICD-10-CM | POA: Diagnosis not present

## 2018-06-12 DIAGNOSIS — S92911A Unspecified fracture of right toe(s), initial encounter for closed fracture: Secondary | ICD-10-CM | POA: Diagnosis present

## 2018-06-12 DIAGNOSIS — Z809 Family history of malignant neoplasm, unspecified: Secondary | ICD-10-CM

## 2018-06-12 DIAGNOSIS — Z823 Family history of stroke: Secondary | ICD-10-CM

## 2018-06-12 DIAGNOSIS — L089 Local infection of the skin and subcutaneous tissue, unspecified: Secondary | ICD-10-CM | POA: Diagnosis not present

## 2018-06-12 DIAGNOSIS — R52 Pain, unspecified: Secondary | ICD-10-CM | POA: Diagnosis not present

## 2018-06-12 DIAGNOSIS — R011 Cardiac murmur, unspecified: Secondary | ICD-10-CM | POA: Diagnosis present

## 2018-06-12 DIAGNOSIS — I2510 Atherosclerotic heart disease of native coronary artery without angina pectoris: Secondary | ICD-10-CM | POA: Diagnosis not present

## 2018-06-12 DIAGNOSIS — Z9049 Acquired absence of other specified parts of digestive tract: Secondary | ICD-10-CM | POA: Diagnosis not present

## 2018-06-12 DIAGNOSIS — I129 Hypertensive chronic kidney disease with stage 1 through stage 4 chronic kidney disease, or unspecified chronic kidney disease: Secondary | ICD-10-CM | POA: Diagnosis not present

## 2018-06-12 DIAGNOSIS — Z7983 Long term (current) use of bisphosphonates: Secondary | ICD-10-CM | POA: Diagnosis not present

## 2018-06-12 DIAGNOSIS — Z89421 Acquired absence of other right toe(s): Secondary | ICD-10-CM | POA: Diagnosis not present

## 2018-06-12 DIAGNOSIS — Z7982 Long term (current) use of aspirin: Secondary | ICD-10-CM

## 2018-06-12 DIAGNOSIS — Z85820 Personal history of malignant melanoma of skin: Secondary | ICD-10-CM | POA: Diagnosis not present

## 2018-06-12 DIAGNOSIS — E538 Deficiency of other specified B group vitamins: Secondary | ICD-10-CM | POA: Diagnosis present

## 2018-06-12 DIAGNOSIS — E569 Vitamin deficiency, unspecified: Secondary | ICD-10-CM | POA: Diagnosis not present

## 2018-06-12 DIAGNOSIS — Z79899 Other long term (current) drug therapy: Secondary | ICD-10-CM

## 2018-06-12 DIAGNOSIS — S92511A Displaced fracture of proximal phalanx of right lesser toe(s), initial encounter for closed fracture: Secondary | ICD-10-CM | POA: Diagnosis not present

## 2018-06-12 DIAGNOSIS — M869 Osteomyelitis, unspecified: Secondary | ICD-10-CM | POA: Diagnosis present

## 2018-06-12 DIAGNOSIS — Z885 Allergy status to narcotic agent status: Secondary | ICD-10-CM

## 2018-06-12 DIAGNOSIS — M810 Age-related osteoporosis without current pathological fracture: Secondary | ICD-10-CM | POA: Diagnosis not present

## 2018-06-12 DIAGNOSIS — Z882 Allergy status to sulfonamides status: Secondary | ICD-10-CM

## 2018-06-12 DIAGNOSIS — J449 Chronic obstructive pulmonary disease, unspecified: Secondary | ICD-10-CM | POA: Diagnosis present

## 2018-06-12 DIAGNOSIS — R300 Dysuria: Secondary | ICD-10-CM | POA: Diagnosis not present

## 2018-06-12 DIAGNOSIS — Z881 Allergy status to other antibiotic agents status: Secondary | ICD-10-CM

## 2018-06-12 DIAGNOSIS — Z888 Allergy status to other drugs, medicaments and biological substances status: Secondary | ICD-10-CM

## 2018-06-12 DIAGNOSIS — M6281 Muscle weakness (generalized): Secondary | ICD-10-CM | POA: Diagnosis not present

## 2018-06-12 DIAGNOSIS — R262 Difficulty in walking, not elsewhere classified: Secondary | ICD-10-CM | POA: Diagnosis not present

## 2018-06-12 DIAGNOSIS — Z880 Allergy status to penicillin: Secondary | ICD-10-CM

## 2018-06-12 DIAGNOSIS — I209 Angina pectoris, unspecified: Secondary | ICD-10-CM | POA: Diagnosis not present

## 2018-06-12 DIAGNOSIS — F3289 Other specified depressive episodes: Secondary | ICD-10-CM | POA: Diagnosis not present

## 2018-06-12 DIAGNOSIS — R062 Wheezing: Secondary | ICD-10-CM | POA: Diagnosis not present

## 2018-06-12 DIAGNOSIS — N179 Acute kidney failure, unspecified: Secondary | ICD-10-CM | POA: Diagnosis present

## 2018-06-12 LAB — CBC WITH DIFFERENTIAL/PLATELET
Abs Immature Granulocytes: 0.08 10*3/uL — ABNORMAL HIGH (ref 0.00–0.07)
Basophils Absolute: 0.1 10*3/uL (ref 0.0–0.1)
Basophils Relative: 0 %
EOS PCT: 2 %
Eosinophils Absolute: 0.3 10*3/uL (ref 0.0–0.5)
HCT: 46.2 % — ABNORMAL HIGH (ref 36.0–46.0)
Hemoglobin: 14.9 g/dL (ref 12.0–15.0)
Immature Granulocytes: 1 %
Lymphocytes Relative: 30 %
Lymphs Abs: 3.6 10*3/uL (ref 0.7–4.0)
MCH: 30.7 pg (ref 26.0–34.0)
MCHC: 32.3 g/dL (ref 30.0–36.0)
MCV: 95.1 fL (ref 80.0–100.0)
MONO ABS: 0.9 10*3/uL (ref 0.1–1.0)
MONOS PCT: 7 %
Neutro Abs: 7.2 10*3/uL (ref 1.7–7.7)
Neutrophils Relative %: 60 %
PLATELETS: 309 10*3/uL (ref 150–400)
RBC: 4.86 MIL/uL (ref 3.87–5.11)
RDW: 13.8 % (ref 11.5–15.5)
WBC: 12 10*3/uL — AB (ref 4.0–10.5)
nRBC: 0 % (ref 0.0–0.2)

## 2018-06-12 LAB — LACTIC ACID, PLASMA: Lactic Acid, Venous: 1.3 mmol/L (ref 0.5–1.9)

## 2018-06-12 LAB — COMPREHENSIVE METABOLIC PANEL
ALK PHOS: 86 U/L (ref 38–126)
ALT: 19 U/L (ref 0–44)
AST: 34 U/L (ref 15–41)
Albumin: 3.6 g/dL (ref 3.5–5.0)
Anion gap: 13 (ref 5–15)
BILIRUBIN TOTAL: 0.8 mg/dL (ref 0.3–1.2)
BUN: 27 mg/dL — AB (ref 8–23)
CALCIUM: 9.4 mg/dL (ref 8.9–10.3)
CO2: 24 mmol/L (ref 22–32)
CREATININE: 1.34 mg/dL — AB (ref 0.44–1.00)
Chloride: 95 mmol/L — ABNORMAL LOW (ref 98–111)
GFR calc Af Amer: 39 mL/min — ABNORMAL LOW (ref >60)
GFR calc non Af Amer: 34 mL/min — ABNORMAL LOW (ref >60)
GLUCOSE: 114 mg/dL — AB (ref 70–99)
Potassium: 4.8 mmol/L (ref 3.5–5.1)
Sodium: 132 mmol/L — ABNORMAL LOW (ref 135–145)
TOTAL PROTEIN: 7.8 g/dL (ref 6.5–8.1)

## 2018-06-12 LAB — URINALYSIS, COMPLETE (UACMP) WITH MICROSCOPIC
Bacteria, UA: NONE SEEN
Bilirubin Urine: NEGATIVE
Glucose, UA: NEGATIVE mg/dL
Hgb urine dipstick: NEGATIVE
Ketones, ur: NEGATIVE mg/dL
Leukocytes, UA: NEGATIVE
Nitrite: NEGATIVE
PH: 6 (ref 5.0–8.0)
Protein, ur: NEGATIVE mg/dL
SPECIFIC GRAVITY, URINE: 1.006 (ref 1.005–1.030)

## 2018-06-12 LAB — MRSA PCR SCREENING: MRSA BY PCR: NEGATIVE

## 2018-06-12 MED ORDER — GABAPENTIN 300 MG PO CAPS
300.0000 mg | ORAL_CAPSULE | Freq: Every day | ORAL | Status: DC
  Administered 2018-06-13 – 2018-06-14 (×2): 300 mg via ORAL
  Filled 2018-06-12 (×3): qty 1

## 2018-06-12 MED ORDER — BIOTIN 1000 MCG PO TABS: 1000.0000 ug | ORAL_TABLET | Freq: Every day | ORAL | Status: DC

## 2018-06-12 MED ORDER — PANTOPRAZOLE SODIUM 40 MG PO TBEC
40.0000 mg | DELAYED_RELEASE_TABLET | Freq: Every day | ORAL | Status: DC
  Administered 2018-06-13 – 2018-06-16 (×3): 40 mg via ORAL
  Filled 2018-06-12 (×3): qty 1

## 2018-06-12 MED ORDER — TAPENTADOL HCL 50 MG PO TABS
50.0000 mg | ORAL_TABLET | Freq: Two times a day (BID) | ORAL | Status: DC | PRN
  Administered 2018-06-12 – 2018-06-13 (×2): 50 mg via ORAL
  Filled 2018-06-12 (×2): qty 1

## 2018-06-12 MED ORDER — ASPIRIN EC 81 MG PO TBEC
81.0000 mg | DELAYED_RELEASE_TABLET | Freq: Every day | ORAL | Status: DC
  Administered 2018-06-13 – 2018-06-16 (×3): 81 mg via ORAL
  Filled 2018-06-12 (×3): qty 1

## 2018-06-12 MED ORDER — SERTRALINE HCL 50 MG PO TABS
100.0000 mg | ORAL_TABLET | Freq: Every day | ORAL | Status: DC
  Administered 2018-06-13 – 2018-06-16 (×3): 100 mg via ORAL
  Filled 2018-06-12 (×3): qty 2

## 2018-06-12 MED ORDER — CHOLECALCIFEROL 10 MCG (400 UNIT) PO TABS
400.0000 [IU] | ORAL_TABLET | Freq: Two times a day (BID) | ORAL | Status: DC
  Administered 2018-06-12 – 2018-06-16 (×7): 400 [IU] via ORAL
  Filled 2018-06-12 (×9): qty 1

## 2018-06-12 MED ORDER — SODIUM CHLORIDE 0.9 % IV SOLN
1.0000 g | INTRAVENOUS | Status: DC
  Administered 2018-06-13 – 2018-06-14 (×2): 1 g via INTRAVENOUS
  Filled 2018-06-12: qty 10
  Filled 2018-06-12 (×2): qty 1

## 2018-06-12 MED ORDER — HEPARIN SODIUM (PORCINE) 5000 UNIT/ML IJ SOLN
5000.0000 [IU] | Freq: Three times a day (TID) | INTRAMUSCULAR | Status: DC
  Administered 2018-06-12 – 2018-06-13 (×3): 5000 [IU] via SUBCUTANEOUS
  Filled 2018-06-12 (×3): qty 1

## 2018-06-12 MED ORDER — SODIUM CHLORIDE 0.9 % IV SOLN: 2.0000 g | Freq: Once | INTRAVENOUS | Status: DC

## 2018-06-12 MED ORDER — ADULT MULTIVITAMIN W/MINERALS CH
1.0000 | ORAL_TABLET | Freq: Every day | ORAL | Status: DC
  Administered 2018-06-13 – 2018-06-16 (×3): 1 via ORAL
  Filled 2018-06-12 (×3): qty 1

## 2018-06-12 MED ORDER — NITROGLYCERIN 0.4 MG SL SUBL: 0.4000 mg | SUBLINGUAL_TABLET | SUBLINGUAL | Status: DC | PRN

## 2018-06-12 MED ORDER — OXYBUTYNIN CHLORIDE 5 MG PO TABS
5.0000 mg | ORAL_TABLET | Freq: Every day | ORAL | Status: DC
  Administered 2018-06-13 – 2018-06-16 (×3): 5 mg via ORAL
  Filled 2018-06-12 (×4): qty 1

## 2018-06-12 MED ORDER — SODIUM CHLORIDE 0.9 % IV SOLN
INTRAVENOUS | Status: DC
  Administered 2018-06-12 – 2018-06-14 (×3): via INTRAVENOUS

## 2018-06-12 MED ORDER — ACETAMINOPHEN 500 MG PO TABS
500.0000 mg | ORAL_TABLET | Freq: Four times a day (QID) | ORAL | Status: DC | PRN
  Administered 2018-06-13: 500 mg via ORAL
  Filled 2018-06-12 (×2): qty 1

## 2018-06-12 MED ORDER — PROPRANOLOL HCL ER 60 MG PO CP24
60.0000 mg | ORAL_CAPSULE | Freq: Every day | ORAL | Status: DC
  Administered 2018-06-13 – 2018-06-16 (×4): 60 mg via ORAL
  Filled 2018-06-12 (×4): qty 1

## 2018-06-12 MED ORDER — VERAPAMIL HCL ER 240 MG PO TBCR
240.0000 mg | EXTENDED_RELEASE_TABLET | Freq: Every day | ORAL | Status: DC
  Administered 2018-06-12 – 2018-06-15 (×4): 240 mg via ORAL
  Filled 2018-06-12 (×5): qty 1

## 2018-06-12 MED ORDER — FLUTICASONE PROPIONATE HFA 220 MCG/ACT IN AERO: 2.0000 | INHALATION_SPRAY | Freq: Two times a day (BID) | RESPIRATORY_TRACT | Status: DC

## 2018-06-12 MED ORDER — SODIUM CHLORIDE 0.9 % IV SOLN
1.0000 g | Freq: Once | INTRAVENOUS | Status: AC
  Administered 2018-06-12: 1 g via INTRAVENOUS
  Filled 2018-06-12: qty 10

## 2018-06-12 MED ORDER — AMLODIPINE BESYLATE 5 MG PO TABS
5.0000 mg | ORAL_TABLET | Freq: Every day | ORAL | Status: DC
  Administered 2018-06-13: 5 mg via ORAL
  Filled 2018-06-12: qty 1

## 2018-06-12 MED ORDER — ALENDRONATE SODIUM 70 MG PO TABS: 70.0000 mg | ORAL_TABLET | ORAL | Status: DC

## 2018-06-12 MED ORDER — FLAX SEED OIL 1000 MG PO CAPS: 1.0000 | ORAL_CAPSULE | Freq: Every day | ORAL | Status: DC

## 2018-06-12 MED ORDER — VANCOMYCIN HCL 10 G IV SOLR: 1250.0000 mg | INTRAVENOUS | Status: DC

## 2018-06-12 MED ORDER — VANCOMYCIN HCL IN DEXTROSE 1-5 GM/200ML-% IV SOLN: 1000.0000 mg | Freq: Once | INTRAVENOUS | Status: DC

## 2018-06-12 MED ORDER — GUAIFENESIN 100 MG/5ML PO SOLN
200.0000 mg | Freq: Three times a day (TID) | ORAL | Status: DC | PRN
  Filled 2018-06-12: qty 10

## 2018-06-12 MED ORDER — VANCOMYCIN HCL 10 G IV SOLR
1250.0000 mg | Freq: Once | INTRAVENOUS | Status: DC
  Filled 2018-06-12: qty 1250

## 2018-06-12 MED ORDER — MAGNESIUM HYDROXIDE 400 MG/5ML PO SUSP: 30.0000 mL | Freq: Every day | ORAL | Status: DC | PRN

## 2018-06-12 MED ORDER — BUDESONIDE 0.25 MG/2ML IN SUSP
0.2500 mg | Freq: Two times a day (BID) | RESPIRATORY_TRACT | Status: DC
  Administered 2018-06-12 – 2018-06-16 (×8): 0.25 mg via RESPIRATORY_TRACT
  Filled 2018-06-12 (×8): qty 2

## 2018-06-12 MED ORDER — DOCUSATE SODIUM 100 MG PO CAPS
100.0000 mg | ORAL_CAPSULE | Freq: Two times a day (BID) | ORAL | Status: DC | PRN
  Administered 2018-06-15: 100 mg via ORAL
  Filled 2018-06-12: qty 1

## 2018-06-12 NOTE — ED Provider Notes (Signed)
[facility] Emergency Department Provider Note    First MD Initiated Contact with Patient 06/12/18 1322     (approximate)  I have reviewed the triage vital signs and the nursing notes.   HISTORY  Chief Complaint No chief complaint on file.    HPI Cindy Graham is a 85 y.o. female the below listed past medical history presents the ER for evaluation of skin breakdown and cellulitis of the right minor toe.  Reportedly has had issues with ulcers and skin breakdown to her toes previously but is worried that the right minor neglected.  Denies any interval trauma.  States is been sore for the past several days.  Denies any fevers.  Not currently on any antibiotics.  Does not have a history of diabetes reportedly.  Not on any blood thinners.    Past Medical History:  Diagnosis Date  . Arthritis    osteo  . COPD (chronic obstructive pulmonary disease) ([place])   . GERD (gastroesophageal reflux disease)   . Heart murmur   . Hernia   . Hyperlipidemia   . Hypertension   . Melanoma ([place])    face  . Syncope and collapse   . Urine incontinence    Family History  Problem Relation Age of Onset  . Stroke Father   . Heart disease Sister   . Cancer Brother        colon  . Heart disease Brother   . Cancer Sister        colon  . Heart attack Brother   . Heart attack Son   . Parkinson's disease Son    Past Surgical History:  Procedure Laterality Date  . ABDOMINAL HYSTERECTOMY    . CHOLECYSTECTOMY    . HERNIA REPAIR     Patient Active Problem List   Diagnosis Date Noted  . Hyponatremia 11/24/2017  . Headache 11/24/2017  . Wound of left leg 11/01/2017  . Constipation 09/05/2017  . Other fatigue 12/22/2016  . Tremor 03/29/2016  . Amaurosis fugax 12/25/2015  . Asthma 09/17/2015  . PVD (peripheral vascular disease) ([place]) 05/28/2015  . B12 deficiency 08/29/2014  . Chronic back pain greater than 3 months duration 04/23/2013  . Chronic kidney disease (CKD),  stage III (moderate) ([place]) 04/12/2012  . Hyperlipidemia LDL goal < 100 03/05/2012  . UTI (urinary tract infection) 07/26/2011  . Osteoarthritis 07/06/2011  . Hypertension 02/21/2011  . Depression/Anxiety 02/21/2011      Prior to Admission medications   Medication Sig Start Date End Date Taking? Authorizing Provider  acetaminophen (TYLENOL) 500 MG tablet Take 500 mg by mouth every 6 (six) hours as needed.   Yes [provider]  alendronate (FOSAMAX) 70 MG tablet Take 70 mg by mouth every 7 (seven) days. 02/22/18  Yes [provider]  alum & mag hydroxide-simeth ([place]) 200-200-20 MG/5ML suspension Take 30 mLs by mouth every 6 (six) hours as needed for indigestion or heartburn.   Yes [provider]  aspirin 81 MG tablet Take 81 mg by mouth daily.     Yes [provider]  Biotin 1000 MCG tablet Take 1,000 mcg by mouth daily.     Yes [provider]  cephALEXin (KEFLEX) 500 MG capsule Take 500 mg by mouth 2 (two) times daily. 06/06/18  Yes [provider]  cholecalciferol (VITAMIN D) 400 UNITS TABS Take 400 Units by mouth 2 (two) times daily.     Yes [provider]  enalapril (VASOTEC) 20 MG tablet  TAKE ONE TABLET BY MOUTH EVERY DAY 09/15/17  Yes Leone Haven, MD  Flaxseed, Linseed, (FLAX SEED OIL) 1000 MG CAPS Take 1 capsule by mouth daily.     Yes [provider]  fluticasone (FLOVENT HFA) 220 MCG/ACT inhaler Inhale 2 puffs into the lungs 2 (two) times daily. 08/12/17  Yes Leone Haven, MD  gabapentin (NEURONTIN) 300 MG capsule Take 1 capsule (300 mg total) by mouth at bedtime. 12/06/17  Yes Leone Haven, MD  guaifenesin (ROBITUSSIN) 100 MG/5ML syrup Take 200 mg by mouth 3 (three) times daily as needed for cough.   Yes [provider]  loperamide (IMODIUM) 2 MG capsule Take 2 mg by mouth as needed for diarrhea or loose stools.   Yes [provider]  magnesium hydroxide (MILK OF MAGNESIA)  400 MG/5ML suspension Take 30 mLs by mouth daily as needed for mild constipation.   Yes [provider]  Multiple Vitamin (MULTIVITAMIN) capsule Take 1 capsule by mouth daily.     Yes [provider]  NUCYNTA 50 MG TABS tablet Take 50 mg by mouth 2 (two) times daily as needed (Take 1 tablet twice a day as needed).  04/12/13  Yes [provider]  omeprazole (PRILOSEC) 20 MG capsule TAKE 1 CAPSULE BY MOUTH EVERY DAY. 11/15/17  Yes Leone Haven, MD  oxybutynin (DITROPAN) 5 MG tablet TAKE ONE TABLET BY MOUTH EVERY DAY 09/15/17  Yes Leone Haven, MD  propranolol ER (INDERAL LA) 60 MG 24 hr capsule TAKE 1 CAPSULE BY MOUTH EVERY DAY 01/13/17  Yes Cook, Jayce G, DO  sertraline (ZOLOFT) 100 MG tablet TAKE ONE TABLET BY MOUTH EVERY DAY 11/15/17  Yes Leone Haven, MD  triamcinolone cream (KENALOG) 0.1 % Apply 1 application topically 2 (two) times daily.   Yes [provider]  triamterene-hydrochlorothiazide (DYAZIDE) 37.5-25 MG capsule Take 1 capsule by mouth daily. 05/04/18  Yes [provider]  trolamine salicylate (ASPERCREME) 10 % cream Apply 1 application topically as needed for muscle pain.   Yes [provider]  verapamil (CALAN-SR) 240 MG CR tablet Take 240 mg by mouth at bedtime.   Yes [provider]  amLODipine (NORVASC) 5 MG tablet Take 1 tablet (5 mg total) by mouth daily. Patient not taking: Reported on 06/12/2018 12/07/17   Leone Haven, MD  lactulose (CHRONULAC) 10 GM/15ML solution Take 30 mLs (20 g total) by mouth daily as needed for mild constipation. 12/06/17   Leone Haven, MD  mupirocin cream (BACTROBAN) 2 % Apply 1 application topically 3 (three) times daily. 11/01/17   Burnard Hawthorne, FNP  NARCAN 4 MG/0.1ML LIQD nasal spray kit Place 1 spray into the nose as directed. 03/19/18   [provider]  nitroGLYCERIN (NITROSTAT) 0.3 MG SL tablet DISSOLVE 1 TABLET UNDER TONGUE EVERY 5 MINUTES FOR 3  DOSES AS NEEDED FOR CHEST PAIN. IF NO RELIEF GO TO ER. 01/08/16   Leone Haven, MD  nystatin cream (MYCOSTATIN) Apply 1 application topically 2 (two) times daily. Patient not taking: Reported on 06/12/2018 07/10/17   Leone Haven, MD    Allergies Statins; Tramadol; Levaquin [levofloxacin in d5w]; Neomycin-polymyxin-gramicidin; Penicillins cross reactors; Sulfa antibiotics; and Tetracyclines & related    Social History Social History   Tobacco Use  . Smoking status: Never Smoker  . Smokeless tobacco: Never Used  Substance Use Topics  . Alcohol use: No  . Drug use: No    Review of Systems Patient denies  headaches, rhinorrhea, blurry vision, numbness, shortness of breath, chest pain, edema, cough, abdominal pain, nausea, vomiting, diarrhea, dysuria, fevers, rashes or hallucinations unless otherwise stated above in HPI. ____________________________________________   PHYSICAL EXAM:  VITAL SIGNS: Vitals:   06/12/18 1501 06/12/18 1530  BP: (!) 152/56 (!) 143/47  Pulse: 67 72  Resp: 16 18  Temp:    SpO2: 97% 96%    Constitutional: Alert and oriented.  Eyes: Conjunctivae are normal.  Head: Atraumatic. Nose: No congestion/rhinnorhea. Mouth/Throat: Mucous membranes are moist.   Neck: No stridor. Painless ROM.  Cardiovascular: Normal rate, regular rhythm. Grossly normal heart sounds.  Good peripheral circulation. Respiratory: Normal respiratory effort.  No retractions. Lungs CTAB. Gastrointestinal: Soft and nontender. No distention. No abdominal bruits. No CVA tenderness. Genitourinary:  Musculoskeletal: right minor tow ecchymosis and gangrenous changes to dorsal aspect with surrounding cellulitis and warmth.  No crepitus.  dp and pt pulse palpable. No lower extremity tenderness nor edema.  No joint effusions. Neurologic:  Normal speech and language. No gross focal neurologic deficits are appreciated. No facial droop Skin:  Skin is warm, dry and intact. No rash  noted. Psychiatric: Mood and affect are normal. Speech and behavior are normal.  ____________________________________________   LABS (all labs ordered are listed, but only abnormal results are displayed)  Results for orders placed or performed during the hospital encounter of 06/12/18 (from the past 24 hour(s))  Lactic acid, plasma     Status: None   Collection Time: 06/12/18 11:49 AM  Result Value Ref Range   Lactic Acid, Venous 1.3 0.5 - 1.9 mmol/L  Comprehensive metabolic panel     Status: Abnormal   Collection Time: 06/12/18 11:49 AM  Result Value Ref Range   Sodium 132 (L) 135 - 145 mmol/L   Potassium 4.8 3.5 - 5.1 mmol/L   Chloride 95 (L) 98 - 111 mmol/L   CO2 24 22 - 32 mmol/L   Glucose, Bld 114 (H) 70 - 99 mg/dL   BUN 27 (H) 8 - 23 mg/dL   Creatinine, Ser 1.34 (H) 0.44 - 1.00 mg/dL   Calcium 9.4 8.9 - 10.3 mg/dL   Total Protein 7.8 6.5 - 8.1 g/dL   Albumin 3.6 3.5 - 5.0 g/dL   AST 34 15 - 41 U/L   ALT 19 0 - 44 U/L   Alkaline Phosphatase 86 38 - 126 U/L   Total Bilirubin 0.8 0.3 - 1.2 mg/dL   GFR calc non Af Amer 34 (L) >60 mL/min   GFR calc Af Amer 39 (L) >60 mL/min   Anion gap 13 5 - 15  CBC with Differential     Status: Abnormal   Collection Time: 06/12/18 11:49 AM  Result Value Ref Range   WBC 12.0 (H) 4.0 - 10.5 K/uL   RBC 4.86 3.87 - 5.11 MIL/uL   Hemoglobin 14.9 12.0 - 15.0 g/dL   HCT 46.2 (H) 36.0 - 46.0 %   MCV 95.1 80.0 - 100.0 fL   MCH 30.7 26.0 - 34.0 pg   MCHC 32.3 30.0 - 36.0 g/dL   RDW 13.8 11.5 - 15.5 %   Platelets 309 150 - 400 K/uL   nRBC 0.0 0.0 - 0.2 %   Neutrophils Relative % 60 %   Neutro Abs 7.2 1.7 - 7.7 K/uL   Lymphocytes Relative 30 %   Lymphs Abs 3.6 0.7 - 4.0 K/uL   Monocytes Relative 7 %   Monocytes Absolute 0.9 0.1 - 1.0 K/uL   Eosinophils Relative 2 %  Eosinophils Absolute 0.3 0.0 - 0.5 K/uL   Basophils Relative 0 %   Basophils Absolute 0.1 0.0 - 0.1 K/uL   Immature Granulocytes 1 %   Abs Immature Granulocytes 0.08 (H)  0.00 - 0.07 K/uL  Urinalysis, Complete w Microscopic     Status: Abnormal   Collection Time: 06/12/18  2:21 PM  Result Value Ref Range   Color, Urine YELLOW (A) YELLOW   APPearance CLEAR (A) CLEAR   Specific Gravity, Urine 1.006 1.005 - 1.030   pH 6.0 5.0 - 8.0   Glucose, UA NEGATIVE NEGATIVE mg/dL   Hgb urine dipstick NEGATIVE NEGATIVE   Bilirubin Urine NEGATIVE NEGATIVE   Ketones, ur NEGATIVE NEGATIVE mg/dL   Protein, ur NEGATIVE NEGATIVE mg/dL   Nitrite NEGATIVE NEGATIVE   Leukocytes, UA NEGATIVE NEGATIVE   RBC / HPF 0-5 0 - 5 RBC/hpf   WBC, UA 0-5 0 - 5 WBC/hpf   Bacteria, UA NONE SEEN NONE SEEN   Squamous Epithelial / LPF 0-5 0 - 5   Hyaline Casts, UA PRESENT    ____________________________________________  EKG My review and personal interpretation at Time: 16:07   Indication: sinus  Rate: 70  Rhythm: sinus Axis: normal Other: no stemi,  ____________________________________________  RADIOLOGY  I personally reviewed all radiographic images ordered to evaluate for the above acute complaints and reviewed radiology reports and findings.  These findings were personally discussed with the patient.  Please see medical record for radiology report.  ____________________________________________   PROCEDURES  Procedure(s) performed:  Procedures    Critical Care performed: no ____________________________________________   INITIAL IMPRESSION / ASSESSMENT AND PLAN / ED COURSE  Pertinent labs & imaging results that were available during my care of the patient were reviewed by me and considered in my medical decision making (see chart for details).   DDX: fracture, osteo, pad, gangrene  MARYAM FEELY is a 85 y.o. who presents to the ED with wound to the right minor toe as described above.  Patient with palpable pulses.  No fever and nontoxic-appearing but does have gangrenous changes to the dorsal loss of the right toe.  Does have evidence of underlying fracture but does  not remember trauma.  No clear evidence of osteo-.  Family reports that she is already been on antibiotics but cannot recall what antibiotic for the past week and is she is having worsening skin breakdown will discuss with hospitalist for admission for IV antibiotics.      As part of my medical decision making, I reviewed the following data within the [place] notes reviewed and incorporated, Labs reviewed, notes from prior ED visits.   ____________________________________________   FINAL CLINICAL IMPRESSION(S) / ED DIAGNOSES  Final diagnoses:  Gangrene of toe of right foot ([place])      NEW MEDICATIONS STARTED DURING THIS VISIT:  New Prescriptions   No medications on file     Note:  This document was prepared using Dragon voice recognition software and may include unintentional dictation errors.    Merlyn Lot, MD 06/12/18 1623

## 2018-06-12 NOTE — H&P (Signed)
[facility] NAME: Cindy Graham    MR#:  [id number]  DATE OF BIRTH:  Sep 29, 1921  DATE OF ADMISSION:  06/12/2018  PRIMARY CARE PHYSICIAN: Leone Haven, MD   REQUESTING/REFERRING PHYSICIAN: Quentin Cornwall  CHIEF COMPLAINT:  No chief complaint on file.   HISTORY OF PRESENT ILLNESS: Cindy Graham  is a 85 y.o. female with a known history of arthritis, COPD, gastroesophageal reflux disease, hyperlipidemia, hypertension, urine incontinence-lives in [facility] due to her old age where she is able to walk with a walker.  For last few days 2 weeks she had right fifth toe redness and pain and she complained that to her visiting doctors at the facility, they started on antibiotics.  She is taking antibiotic for last 4 days but her pain and swelling continued to get worse. The doctor that facility had checked the wound again today and as it was getting worse they advised her to go to emergency room. Patient denies any fever or chills.  Due to significant pain she is not able to walk much now.  PAST MEDICAL HISTORY:   Past Medical History:  Diagnosis Date  . Arthritis    osteo  . COPD (chronic obstructive pulmonary disease) ([place])   . GERD (gastroesophageal reflux disease)   . Heart murmur   . Hernia   . Hyperlipidemia   . Hypertension   . Melanoma ([place])    face  . Syncope and collapse   . Urine incontinence     PAST SURGICAL HISTORY:  Past Surgical History:  Procedure Laterality Date  . ABDOMINAL HYSTERECTOMY    . CHOLECYSTECTOMY    . HERNIA REPAIR      SOCIAL HISTORY:  Social History   Tobacco Use  . Smoking status: Never Smoker  . Smokeless tobacco: Never Used  Substance Use Topics  . Alcohol use: No    FAMILY HISTORY:  Family History  Problem Relation Age of Onset  . Stroke Father   . Heart disease Sister   . Cancer Brother        colon  . Heart disease Brother   . Cancer Sister        colon  . Heart attack  Brother   . Heart attack Son   . Parkinson's disease Son     DRUG ALLERGIES:  Allergies  Allergen Reactions  . Statins Other (See Comments)    Very bad muscle aches  . Tramadol Itching  . Levaquin [Levofloxacin In D5w] Diarrhea and Other (See Comments)    Confusion, hallucinations  . Neomycin-Polymyxin-Gramicidin Other (See Comments)  . Penicillins Cross Reactors   . Sulfa Antibiotics   . Tetracyclines & Related     REVIEW OF SYSTEMS:   CONSTITUTIONAL: No fever, fatigue or weakness.  EYES: No blurred or double vision.  EARS, NOSE, AND THROAT: No tinnitus or ear pain.  RESPIRATORY: No cough, shortness of breath, wheezing or hemoptysis.  CARDIOVASCULAR: No chest pain, orthopnea, edema.  GASTROINTESTINAL: No nausea, vomiting, diarrhea or abdominal pain.  GENITOURINARY: No dysuria, hematuria.  ENDOCRINE: No polyuria, nocturia,  HEMATOLOGY: No anemia, easy bruising or bleeding SKIN: No rash or lesion. MUSCULOSKELETAL: No joint pain or arthritis.   NEUROLOGIC: No tingling, numbness, weakness.  PSYCHIATRY: No anxiety or depression.   MEDICATIONS AT HOME:  Prior to Admission medications   Medication Sig Start Date End Date Taking? Authorizing Provider  acetaminophen (TYLENOL) 500 MG tablet Take 500 mg by mouth every 6 (six) hours  as needed.   Yes [provider]  alendronate (FOSAMAX) 70 MG tablet Take 70 mg by mouth every 7 (seven) days. 02/22/18  Yes [provider]  alum & mag hydroxide-simeth ([place]) 200-200-20 MG/5ML suspension Take 30 mLs by mouth every 6 (six) hours as needed for indigestion or heartburn.   Yes [provider]  aspirin 81 MG tablet Take 81 mg by mouth daily.     Yes [provider]  Biotin 1000 MCG tablet Take 1,000 mcg by mouth daily.     Yes [provider]  cephALEXin (KEFLEX) 500 MG capsule Take 500 mg by mouth 2 (two) times daily. 06/06/18  Yes [provider]  cholecalciferol (VITAMIN D) 400 UNITS  TABS Take 400 Units by mouth 2 (two) times daily.     Yes [provider]  enalapril (VASOTEC) 20 MG tablet TAKE ONE TABLET BY MOUTH EVERY DAY 09/15/17  Yes Sonnenberg, Angela Adam, MD  Flaxseed, Linseed, (FLAX SEED OIL) 1000 MG CAPS Take 1 capsule by mouth daily.     Yes [provider]  fluticasone (FLOVENT HFA) 220 MCG/ACT inhaler Inhale 2 puffs into the lungs 2 (two) times daily. 08/12/17  Yes Leone Haven, MD  gabapentin (NEURONTIN) 300 MG capsule Take 1 capsule (300 mg total) by mouth at bedtime. 12/06/17  Yes Leone Haven, MD  guaifenesin (ROBITUSSIN) 100 MG/5ML syrup Take 200 mg by mouth 3 (three) times daily as needed for cough.   Yes [provider]  loperamide (IMODIUM) 2 MG capsule Take 2 mg by mouth as needed for diarrhea or loose stools.   Yes [provider]  magnesium hydroxide (MILK OF MAGNESIA) 400 MG/5ML suspension Take 30 mLs by mouth daily as needed for mild constipation.   Yes [provider]  Multiple Vitamin (MULTIVITAMIN) capsule Take 1 capsule by mouth daily.     Yes [provider]  NUCYNTA 50 MG TABS tablet Take 50 mg by mouth 2 (two) times daily as needed (Take 1 tablet twice a day as needed).  04/12/13  Yes [provider]  omeprazole (PRILOSEC) 20 MG capsule TAKE 1 CAPSULE BY MOUTH EVERY DAY. 11/15/17  Yes Leone Haven, MD  oxybutynin (DITROPAN) 5 MG tablet TAKE ONE TABLET BY MOUTH EVERY DAY 09/15/17  Yes Leone Haven, MD  propranolol ER (INDERAL LA) 60 MG 24 hr capsule TAKE 1 CAPSULE BY MOUTH EVERY DAY 01/13/17  Yes Cook, Jayce G, DO  sertraline (ZOLOFT) 100 MG tablet TAKE ONE TABLET BY MOUTH EVERY DAY 11/15/17  Yes Leone Haven, MD  triamcinolone cream (KENALOG) 0.1 % Apply 1 application topically 2 (two) times daily.   Yes [provider]  triamterene-hydrochlorothiazide (DYAZIDE) 37.5-25 MG capsule Take 1 capsule by mouth daily. 05/04/18  Yes [provider]  trolamine  salicylate (ASPERCREME) 10 % cream Apply 1 application topically as needed for muscle pain.   Yes [provider]  verapamil (CALAN-SR) 240 MG CR tablet Take 240 mg by mouth at bedtime.   Yes [provider]  amLODipine (NORVASC) 5 MG tablet Take 1 tablet (5 mg total) by mouth daily. Patient not taking: Reported on 06/12/2018 12/07/17   Leone Haven, MD  lactulose (CHRONULAC) 10 GM/15ML solution Take 30 mLs (20 g total) by mouth daily as needed for mild constipation. 12/06/17   Leone Haven, MD  mupirocin cream (BACTROBAN) 2 % Apply 1 application topically 3 (three) times daily. 11/01/17   Burnard Hawthorne, FNP  NARCAN 4 MG/0.1ML LIQD nasal spray kit Place 1 spray into the nose as directed. 03/19/18   [provider]  nitroGLYCERIN (NITROSTAT) 0.3 MG SL tablet DISSOLVE 1 TABLET UNDER TONGUE EVERY 5 MINUTES FOR 3 DOSES AS NEEDED FOR CHEST PAIN. IF NO RELIEF GO TO ER. 01/08/16   Leone Haven, MD  nystatin cream (MYCOSTATIN) Apply 1 application topically 2 (two) times daily. Patient not taking: Reported on 06/12/2018 07/10/17   Leone Haven, MD      PHYSICAL EXAMINATION:   VITAL SIGNS: Blood pressure (!) 157/60, pulse 75, temperature 98.3 F (36.8 C), resp. rate 18, height 5' 3.5" (1.613 m), weight 77.1 kg, SpO2 96 %.  GENERAL:  85 y.o.-year-old patient lying in the bed with no acute distress.  EYES: Pupils equal, round, reactive to light and accommodation. No scleral icterus. Extraocular muscles intact.  HEENT: Head atraumatic, normocephalic. Oropharynx and nasopharynx clear.  NECK:  Supple, no jugular venous distention. No thyroid enlargement, no tenderness.  LUNGS: Normal breath sounds bilaterally, no wheezing, rales,rhonchi or crepitation. No use of accessory muscles of respiration.  CARDIOVASCULAR: S1, S2 normal. No murmurs, rubs, or gallops.  ABDOMEN: Soft, nontender, nondistended. Bowel sounds present. No organomegaly or mass.  EXTREMITIES:  No pedal edema, cyanosis, or clubbing.  Right foot fifth toe is in dressing.  It is somewhat discolored and tender to touch. NEUROLOGIC: Cranial nerves II through XII are intact. Muscle strength 4/5 in all extremities. Sensation intact. Gait not checked.  PSYCHIATRIC: The patient is alert and oriented x 3.  SKIN: No obvious rash, lesion, or ulcer.   LABORATORY PANEL:   CBC Recent Labs  Lab 06/12/18 1149  WBC 12.0*  HGB 14.9  HCT 46.2*  PLT 309  MCV 95.1  MCH 30.7  MCHC 32.3  RDW 13.8  LYMPHSABS 3.6  MONOABS 0.9  EOSABS 0.3  BASOSABS 0.1   ------------------------------------------------------------------------------------------------------------------  Chemistries  Recent Labs  Lab 06/12/18 1149  NA 132*  K 4.8  CL 95*  CO2 24  GLUCOSE 114*  BUN 27*  CREATININE 1.34*  CALCIUM 9.4  AST 34  ALT 19  ALKPHOS 86  BILITOT 0.8   ------------------------------------------------------------------------------------------------------------------ estimated creatinine clearance is 25.5 mL/min (A) (by C-G formula based on SCr of 1.34 mg/dL (H)). ------------------------------------------------------------------------------------------------------------------ No results for input(s): TSH, T4TOTAL, T3FREE, THYROIDAB in the last 72 hours.  Invalid input(s): FREET3   Coagulation profile No results for input(s): INR, PROTIME in the last 168 hours. ------------------------------------------------------------------------------------------------------------------- No results for input(s): DDIMER in the last 72 hours. -------------------------------------------------------------------------------------------------------------------  Cardiac Enzymes No results for input(s): CKMB, TROPONINI, MYOGLOBIN in the last 168 hours.  Invalid input(s): CK ------------------------------------------------------------------------------------------------------------------ Invalid input(s):  POCBNP  ---------------------------------------------------------------------------------------------------------------  Urinalysis    Component Value Date/Time   COLORURINE YELLOW (A) 06/12/2018 1421   APPEARANCEUR CLEAR (A) 06/12/2018 1421   APPEARANCEUR Cloudy (A) 11/14/2017 1411   LABSPEC 1.006 06/12/2018 1421   LABSPEC 1.018 06/01/2013 0933   PHURINE 6.0 06/12/2018 1421   GLUCOSEU NEGATIVE 06/12/2018 1421   GLUCOSEU NEGATIVE 06/17/2014 1123   HGBUR NEGATIVE 06/12/2018 1421   BILIRUBINUR NEGATIVE 06/12/2018 1421   BILIRUBINUR small 11/28/2017 1207   BILIRUBINUR Negative 11/14/2017 1411   BILIRUBINUR Negative 06/01/2013 0933   KETONESUR NEGATIVE 06/12/2018 1421   PROTEINUR NEGATIVE 06/12/2018 1421   UROBILINOGEN 0.2 11/28/2017 1207   UROBILINOGEN 0.2 06/17/2014 1123   NITRITE NEGATIVE 06/12/2018 1421   LEUKOCYTESUR NEGATIVE 06/12/2018 1421   LEUKOCYTESUR 3+ (A) 11/14/2017 1411   LEUKOCYTESUR 2+ 06/01/2013 0933  RADIOLOGY: Dg Toe 5th Right  Result Date: 06/12/2018 CLINICAL DATA:  85 year old female with RIGHT 5th toe redness and swelling. EXAM: RIGHT FIFTH TOE COMPARISON:  None. FINDINGS: A fracture of the distal aspect of the little toe proximal phalanx is noted with at least 8 mm dorsal displacement. No subluxation or dislocation identified. No evidence of focal osteolysis. IMPRESSION: Displaced fracture of the little toe proximal phalanx with at least 8 mm dorsal displacement. No dislocation. Electronically Signed   By: Margarette Canada M.D.   On: 06/12/2018 15:11    EKG: Orders placed or performed during the hospital encounter of 06/12/18  . EKG 12-Lead  . EKG 12-Lead    IMPRESSION AND PLAN:  *Cellulitis Failed outpatient treatment  On x-ray there is a broken bone in right fifth toe. Broad-spectrum antibiotics as ordered by ER physician for now. Patient failed oral therapy as outpatient. Podiatry consult.  *Acute renal failure We will give gentle IV hydration  and monitor renal function  *Hypertension Continue home medications except for diuretics and enalapril  *Gastroesophageal reflux disease Continue PPI.  *COPD Continue inhalers  All the records are reviewed and case discussed with ED provider. Management plans discussed with the patient, family and they are in agreement.  CODE STATUS:    Code Status Orders  (From admission, onward)         Start     Ordered   06/12/18 1827  Do not attempt resuscitation (DNR)  Continuous    Question Answer Comment  In the event of cardiac or respiratory ARREST Do not call a "code blue"   In the event of cardiac or respiratory ARREST Do not perform Intubation, CPR, defibrillation or ACLS   In the event of cardiac or respiratory ARREST Use medication by any route, position, wound care, and other measures to relive pain and suffering. May use oxygen, suction and manual treatment of airway obstruction as needed for comfort.      06/12/18 1826        Code Status History    This patient has a current code status but no historical code status.       TOTAL TIME TAKING CARE OF THIS PATIENT: 45 minutes.    [name] M.D on 06/12/2018   Between 7am to 6pm - Pager - [phone]  After 6pm go to [email or web address] - password [email or web address] [facility]  Office  [phone]  CC: Primary care physician; Leone Haven, MD   Note: This dictation was prepared with Dragon dictation along with smaller phrase technology. Any transcriptional errors that result from this process are unintentional.

## 2018-06-12 NOTE — Consult Note (Signed)
Pharmacy Antibiotic Note  Cindy Graham is a 85 y.o. female admitted on 06/12/2018 with cellulitis.  Pharmacy has been consulted for vancomycin dosing. She has an allergy to penicillin that she described to me as causing her arm to break out. In January she was prescribed cephalexin, which she remembers and tells me she tolerated it well. Her SCr is 1.34 but it is difficult to determine her baseline. About a year ago she had a wound culture that grew Staphylococcus lugdunensis resistant to clindamycin and penicillin.  Plan: 1) Vancomycin 1250 mg IV loading dose, then 1250mg  Q 48 hrs. Goal AUC 400-550. Expected AUC: 494.5 SCr used: 1.34  2) ceftriaxone 1 gram IV every 24 hours  Height: 5' 3.5" (161.3 cm) Weight: 170 lb (77.1 kg) IBW/kg (Calculated) : 53.55  Temp (24hrs), Avg:98.3 F (36.8 C), Min:98.3 F (36.8 C), Max:98.3 F (36.8 C)  Recent Labs  Lab 06/12/18 1149  WBC 12.0*  CREATININE 1.34*  LATICACIDVEN 1.3    Estimated Creatinine Clearance: 25.5 mL/min (A) (by C-G formula based on SCr of 1.34 mg/dL (H)).    Antimicrobials this admission: ceftriaxone 1/21 >>  vancomycin 1/21 >>   Microbiology results: 1/21 UCx: pending   Thank you for allowing pharmacy to be a part of this patient's care.  Dallie Piles, PharmD 06/12/2018 4:22 PM

## 2018-06-12 NOTE — Progress Notes (Signed)
Family Meeting Note  Advance Directive:yes  Today a meeting took place with the Patient and daughter, grand daughter.   The following clinical team members were present during this meeting:MD  The following were discussed:Patient's diagnosis: Old age, functional decline, COPD, cellulitis, acute renal failure, Patient's progosis: Unable to determine and Goals for treatment: DNR  Additional follow-up to be provided: Podiatry  Time spent during discussion:20 minutes  [name], MD

## 2018-06-12 NOTE — ED Notes (Signed)
Pt assisted to bathroom at this time.

## 2018-06-12 NOTE — ED Notes (Signed)
First Nurse Note: Patient to ED from [facility] with complaint of right "pinky toe" pain.  Seen by NP this AM with cellulitis both feet.  VSS.  BP 153/104.  Sitting upright in WC with sister.  HOH.

## 2018-06-12 NOTE — Progress Notes (Signed)
PHARMACIST - PHYSICIAN ORDER COMMUNICATION  CONCERNING: P&T Medication Policy on Herbal Medications  DESCRIPTION:  This patient's order for:  Flax Seed Oil, Biotin  has been noted.  This product(s) is classified as an "herbal" or natural product. Due to a lack of definitive safety studies or FDA approval, nonstandard manufacturing practices, plus the potential risk of unknown drug-drug interactions while on inpatient medications, the Pharmacy and Therapeutics Committee does not permit the use of "herbal" or natural products of this type within [facility].   ACTION TAKEN: The pharmacy department is unable to verify this order at this time and your patient has been informed of this safety policy. Please reevaluate patient's clinical condition at discharge and address if the herbal or natural product(s) should be resumed at that time.

## 2018-06-12 NOTE — ED Notes (Signed)
ED TO INPATIENT HANDOFF REPORT  Name/Age/Gender Cindy Graham 85 y.o. female  Code Status   Home/SNF/Other [place]  house  Chief Complaint toe pain  Level of Care/Admitting Diagnosis ED Disposition    ED Disposition Condition [facility] Area: [place] [100120]  Level of Care: Med-Surg [16]  Diagnosis: Cellulitis [656812]  Admitting Physician: [name] [[id number]]  Attending Physician: [name] [phone]  Estimated length of stay: past midnight tomorrow  Certification:: I certify this patient will need inpatient services for at least 2 midnights  PT Class (Do Not Modify): Inpatient [101]  PT Acc Code (Do Not Modify): Private [1]       Medical History Past Medical History:  Diagnosis Date  . Arthritis    osteo  . COPD (chronic obstructive pulmonary disease) ([place])   . GERD (gastroesophageal reflux disease)   . Heart murmur   . Hernia   . Hyperlipidemia   . Hypertension   . Melanoma ([place])    face  . Syncope and collapse   . Urine incontinence     Allergies Allergies  Allergen Reactions  . Statins Other (See Comments)    Very bad muscle aches  . Tramadol Itching  . Levaquin [Levofloxacin In D5w] Diarrhea and Other (See Comments)    Confusion, hallucinations  . Neomycin-Polymyxin-Gramicidin Other (See Comments)  . Penicillins Cross Reactors   . Sulfa Antibiotics   . Tetracyclines & Related     IV Location/Drains/Wounds Patient Lines/Drains/Airways Status   Active Line/Drains/Airways    Name:   Placement date:   Placement time:   Site:   Days:   Peripheral IV 06/12/18 Right Antecubital   06/12/18    1432    Antecubital   less than 1          Labs/Imaging Results for orders placed or performed during the hospital encounter of 06/12/18 (from the past 48 hour(s))  Lactic acid, plasma     Status: None   Collection Time: 06/12/18 11:49 AM  Result Value Ref Range   Lactic Acid, Venous  1.3 0.5 - 1.9 mmol/L    Comment: Performed at [facility], [address] [place], [place] [address]  Comprehensive metabolic panel     Status: Abnormal   Collection Time: 06/12/18 11:49 AM  Result Value Ref Range   Sodium 132 (L) 135 - 145 mmol/L   Potassium 4.8 3.5 - 5.1 mmol/L    Comment: HEMOLYSIS AT THIS LEVEL MAY AFFECT RESULT   Chloride 95 (L) 98 - 111 mmol/L   CO2 24 22 - 32 mmol/L   Glucose, Bld 114 (H) 70 - 99 mg/dL   BUN 27 (H) 8 - 23 mg/dL   Creatinine, Ser 1.34 (H) 0.44 - 1.00 mg/dL   Calcium 9.4 8.9 - 10.3 mg/dL   Total Protein 7.8 6.5 - 8.1 g/dL   Albumin 3.6 3.5 - 5.0 g/dL   AST 34 15 - 41 U/L   ALT 19 0 - 44 U/L   Alkaline Phosphatase 86 38 - 126 U/L   Total Bilirubin 0.8 0.3 - 1.2 mg/dL   GFR calc non Af Amer 34 (L) >60 mL/min   GFR calc Af Amer 39 (L) >60 mL/min   Anion gap 13 5 - 15    Comment: Performed at [facility], [address], [place], [place] [address]  CBC with Differential     Status: Abnormal   Collection Time: 06/12/18 11:49 AM  Result Value  Ref Range   WBC 12.0 (H) 4.0 - 10.5 K/uL   RBC 4.86 3.87 - 5.11 MIL/uL   Hemoglobin 14.9 12.0 - 15.0 g/dL   HCT 46.2 (H) 36.0 - 46.0 %   MCV 95.1 80.0 - 100.0 fL   MCH 30.7 26.0 - 34.0 pg   MCHC 32.3 30.0 - 36.0 g/dL   RDW 13.8 11.5 - 15.5 %   Platelets 309 150 - 400 K/uL   nRBC 0.0 0.0 - 0.2 %   Neutrophils Relative % 60 %   Neutro Abs 7.2 1.7 - 7.7 K/uL   Lymphocytes Relative 30 %   Lymphs Abs 3.6 0.7 - 4.0 K/uL   Monocytes Relative 7 %   Monocytes Absolute 0.9 0.1 - 1.0 K/uL   Eosinophils Relative 2 %   Eosinophils Absolute 0.3 0.0 - 0.5 K/uL   Basophils Relative 0 %   Basophils Absolute 0.1 0.0 - 0.1 K/uL   Immature Granulocytes 1 %   Abs Immature Granulocytes 0.08 (H) 0.00 - 0.07 K/uL    Comment: Performed at [facility], [address] [place], [place] [address]  Urinalysis, Complete w Microscopic     Status: Abnormal   Collection Time: 06/12/18  2:21 PM   Result Value Ref Range   Color, Urine YELLOW (A) YELLOW   APPearance CLEAR (A) CLEAR   Specific Gravity, Urine 1.006 1.005 - 1.030   pH 6.0 5.0 - 8.0   Glucose, UA NEGATIVE NEGATIVE mg/dL   Hgb urine dipstick NEGATIVE NEGATIVE   Bilirubin Urine NEGATIVE NEGATIVE   Ketones, ur NEGATIVE NEGATIVE mg/dL   Protein, ur NEGATIVE NEGATIVE mg/dL   Nitrite NEGATIVE NEGATIVE   Leukocytes, UA NEGATIVE NEGATIVE   RBC / HPF 0-5 0 - 5 RBC/hpf   WBC, UA 0-5 0 - 5 WBC/hpf   Bacteria, UA NONE SEEN NONE SEEN   Squamous Epithelial / LPF 0-5 0 - 5   Hyaline Casts, UA PRESENT     Comment: Performed at [facility], [address], [place], [place] [address]   Dg Toe 5th Right  Result Date: 06/12/2018 CLINICAL DATA:  85 year old female with RIGHT 5th toe redness and swelling. EXAM: RIGHT FIFTH TOE COMPARISON:  None. FINDINGS: A fracture of the distal aspect of the little toe proximal phalanx is noted with at least 8 mm dorsal displacement. No subluxation or dislocation identified. No evidence of focal osteolysis. IMPRESSION: Displaced fracture of the little toe proximal phalanx with at least 8 mm dorsal displacement. No dislocation. Electronically Signed   By: Margarette Canada M.D.   On: 06/12/2018 15:11    Pending Labs [employer] (From admission, onward)    Start     Ordered   Signed and Occupational hygienist morning,   R     Signed and Held   Signed and Held  CBC  Tomorrow morning,   R     Signed and Held   Signed and Held  CBC  (heparin)  Once,   R    Comments:  Baseline for heparin therapy IF NOT ALREADY DRAWN.  Notify MD if PLT < 100 K.    Signed and Held   Signed and Held  Creatinine, serum  (heparin)  Once,   R    Comments:  Baseline for heparin therapy IF NOT ALREADY DRAWN.    Signed and Held          Vitals/Pain Today's Vitals   06/12/18 1126 06/12/18 1501 06/12/18  1530 06/12/18 1730  BP:  (!) 152/56 (!) 143/47 (!) 168/58  Pulse:  67 72 73  Resp:  16 18  15   Temp:      TempSrc:      SpO2:  97% 96% 97%  Weight: 77.1 kg     Height: 5' 3.5" (1.613 m)     PainSc:        Isolation Precautions No active isolations  Medications Medications  vancomycin (VANCOCIN) 1,250 mg in sodium chloride 0.9 % 250 mL IVPB (has no administration in time range)  cefTRIAXone (ROCEPHIN) 1 g in sodium chloride 0.9 % 100 mL IVPB (1 g Intravenous New Bag/Given 06/12/18 1744)    Mobility Assist x2

## 2018-06-12 NOTE — Care Management Note (Signed)
Case Management Note  Patient Details  Name: Cindy Graham MRN: [id number] Date of Birth: [birth year]  Subjective/Objective:      Patient is being seen in the ED for left toe pain and possible infection.  Patient is from [facility] and gets [place] with [facility], Drue Novel is aware of patient visit to the ED.  Home Health RN came out today and thought the toe looked necrotic and instructed patient to visit the ED.  The patient's foot is red coming from the wound site and the wound is necrotic and the rest of the toe is purple.  Patient and daughter report that the dressing on the toe has not been changed since Thursday but neither of them are good historians.  PCP is confirmed as Press photographer, patient could not tell RNCM who was prescribing wound care.  ED MD has decided on patient admission, patient and family are aware of this decision.  RNCM will follow for discharge needs.  Patient will need more assistance with wound care than what home health can offer.  Drue Novel with [facility] reports that the [facility] RN can only come out 3 times per week max.  Patient needs a consult for wound management during hospital course.  Doran Clay RN BSN [phone]                 Action/Plan:   Expected Discharge Date:                  Expected Discharge Plan:  [place]  In-House Referral:     Discharge planning Services  CM Consult  Post Acute Care Choice:    Choice offered to:     DME Arranged:    DME Agency:     HH Arranged:  (RN with [facility]) [place]:  [facility] (now [facility])(patient is open with [facility])  Status of Service:  In process, will continue to follow  If discussed at Long Length of Stay Meetings, dates discussed:    Additional Comments:  Shelbie Hutching, RN 06/12/2018, 5:09 PM

## 2018-06-12 NOTE — ED Triage Notes (Signed)
Patient presents to the ED with infection to 5th toe on right foot.  Patient states she has been treated for the infection by her doctor at [facility] x 3 weeks but today when the nurse came to treat it, she felt like patient needed to come to the hospital.  Toe is bandaged at this time.  Patient requested toe not be un bandaged in triage.  Redness is radiating from toe into foot.  Foot feels somewhat warm.  Patient is also complaining of left hip pain that is chronic pain.  Patient states she is supposed to take pain medication at noon.

## 2018-06-12 NOTE — ED Notes (Signed)
5th right toe dressed with xeroform and wrapped in gauze and tape.

## 2018-06-12 NOTE — Progress Notes (Signed)

## 2018-06-13 LAB — BASIC METABOLIC PANEL
Anion gap: 7 (ref 5–15)
BUN: 20 mg/dL (ref 8–23)
CO2: 27 mmol/L (ref 22–32)
Calcium: 8.7 mg/dL — ABNORMAL LOW (ref 8.9–10.3)
Chloride: 101 mmol/L (ref 98–111)
Creatinine, Ser: 1.09 mg/dL — ABNORMAL HIGH (ref 0.44–1.00)
GFR calc Af Amer: 50 mL/min — ABNORMAL LOW (ref >60)
GFR calc non Af Amer: 43 mL/min — ABNORMAL LOW (ref >60)
GLUCOSE: 99 mg/dL (ref 70–99)
Potassium: 3.7 mmol/L (ref 3.5–5.1)
Sodium: 135 mmol/L (ref 135–145)

## 2018-06-13 LAB — CBC
HCT: 40.2 % (ref 36.0–46.0)
Hemoglobin: 13 g/dL (ref 12.0–15.0)
MCH: 30.7 pg (ref 26.0–34.0)
MCHC: 32.3 g/dL (ref 30.0–36.0)
MCV: 95 fL (ref 80.0–100.0)
Platelets: 278 10*3/uL (ref 150–400)
RBC: 4.23 MIL/uL (ref 3.87–5.11)
RDW: 13.8 % (ref 11.5–15.5)
WBC: 8.1 10*3/uL (ref 4.0–10.5)
nRBC: 0 % (ref 0.0–0.2)

## 2018-06-13 MED ORDER — TAPENTADOL HCL 50 MG PO TABS: 50.0000 mg | ORAL_TABLET | Freq: Three times a day (TID) | ORAL | Status: DC | PRN

## 2018-06-13 MED ORDER — HYDRALAZINE HCL 20 MG/ML IJ SOLN: 10.0000 mg | Freq: Four times a day (QID) | INTRAMUSCULAR | Status: DC | PRN

## 2018-06-13 MED ORDER — VANCOMYCIN HCL IN DEXTROSE 750-5 MG/150ML-% IV SOLN
750.0000 mg | INTRAVENOUS | Status: DC
  Filled 2018-06-13: qty 150

## 2018-06-13 MED ORDER — TAPENTADOL HCL 50 MG PO TABS
50.0000 mg | ORAL_TABLET | Freq: Three times a day (TID) | ORAL | Status: DC | PRN
  Administered 2018-06-13 – 2018-06-16 (×5): 50 mg via ORAL
  Filled 2018-06-13 (×6): qty 1

## 2018-06-13 MED ORDER — VANCOMYCIN HCL 10 G IV SOLR
1500.0000 mg | INTRAVENOUS | Status: AC
  Administered 2018-06-13: 1500 mg via INTRAVENOUS
  Filled 2018-06-13: qty 1500

## 2018-06-13 MED ORDER — ENALAPRIL MALEATE 20 MG PO TABS
20.0000 mg | ORAL_TABLET | Freq: Every day | ORAL | Status: DC
  Administered 2018-06-13 – 2018-06-16 (×3): 20 mg via ORAL
  Filled 2018-06-13 (×4): qty 1

## 2018-06-13 NOTE — Clinical Social Work Placement (Signed)
   CLINICAL SOCIAL WORK PLACEMENT  NOTE  Date:  06/13/2018  Patient Details  Name: Cindy Graham MRN: [id number] Date of Birth: [birth year]  Clinical Social Work is seeking post-discharge placement for this patient at the [place] level of care (*CSW will initial, date and re-position this form in  chart as items are completed):  Yes   Patient/family provided with [place] Work Department's list of facilities offering this level of care within the geographic area requested by the patient (or if unable, by the patient's family).  Yes   Patient/family informed of their freedom to choose among providers that offer the needed level of care, that participate in Medicare, Medicaid or managed care program needed by the patient, have an available bed and are willing to accept the patient.  Yes   Patient/family informed of [place]'s ownership interest in [facility] and [facility], as well as of the fact that they are under no obligation to receive care at these facilities.  PASRR submitted to EDS on 06/13/18     PASRR number received on 06/13/18     Existing PASRR number confirmed on       FL2 transmitted to all facilities in geographic area requested by pt/family on 06/13/18     FL2 transmitted to all facilities within larger geographic area on       Patient informed that his/her managed care company has contracts with or will negotiate with certain facilities, including the following:            Patient/family informed of bed offers received.  Patient chooses bed at       Physician recommends and patient chooses bed at      Patient to be transferred to   on  .  Patient to be transferred to facility by       Patient family notified on   of transfer.  Name of family member notified:        PHYSICIAN       Additional Comment:    _______________________________________________ Mavrick Mcquigg, Veronia Beets, LCSW 06/13/2018, 11:39 AM

## 2018-06-13 NOTE — Clinical Social Work Note (Signed)
Clinical Social Work Assessment  Patient Details  Name: Cindy Graham MRN: [id number] Date of Birth: 1921/11/26  Date of referral:  06/13/18               Reason for consult:  Facility Placement                Permission sought to share information with:  Chartered certified accountant granted to share information::  Yes, Verbal Permission Granted  Name::      [place]::   [place]   Relationship::     Contact Information:     Housing/Transportation Living arrangements for the past 2 months:  [place] of Information:  Adult Children Patient Interpreter Needed:  None Criminal Activity/Legal Involvement Pertinent to Current Situation/Hospitalization:  No - Comment as needed Significant Relationships:  Adult Children Lives with:  Facility Resident Do you feel safe going back to the place where you live?  Yes Need for family participation in patient care:  Yes (Comment)  Care giving concerns:  Patient is a resident at [facility].    Social Worker assessment / plan:  Holiday representative (CSW) reviewed chart and noted that patient is from [facility]. Per chart patient will have a toe amputation tomorrow. PT is pending. CSW attempted to meet with patient however the nursing staff was changing her clothes. CSW contacted patient's son Cindy Graham. Per son patient has been at [facility] on the ALF side since July 2019. Per son patient had 5 adult children and only 4 are still living. Per son he helps patient make decisions however he is not her official HPOA. Per son patient does not have a HPOA. CSW explained to son that after surgery PT will evaluate her and make a recommendation of home health or SNF. CSW explained that patient may need to go to SNF for a few weeks after surgery before returning to ALF. Son verbalized his understanding and is agreeable to SNF search in [place]. CSW explained that medicare  requires a 3 night qualifying inpatient hospital stay in order to pay for SNF. Patient was admitted to inpatient 1/21. Per son patient has never been to SNF before. Son reported that patient will likely have a hard time walking after surgery because she was not walking well prior to surgery. FL2 complete and faxed out. CSW will continue to follow and assist as needed.   Employment status:  Disabled (Comment on whether or not currently receiving Disability), Retired Forensic scientist:  Medicare, Medicaid In [place] PT Recommendations:  Not assessed at this time Information / Referral to community resources:  Skilled Nursing Facility(SNF vs. ALF )  Patient/Family's Response to care:  Patient's son is agreeable to SNF search in [place].   Patient/Family's Understanding of and Emotional Response to Diagnosis, Current Treatment, and Prognosis:  Patient's son was very pleasant and thanked CSW for assistance.   Emotional Assessment Appearance:  Appears stated age Attitude/Demeanor/Rapport:    Affect (typically observed):  Pleasant Orientation:  Oriented to Self, Oriented to Place, Oriented to Situation, Oriented to  Time Alcohol / Substance use:  Not Applicable Psych involvement (Current and /or in the community):  No (Comment)  Discharge Needs  Concerns to be addressed:  Discharge Planning Concerns Readmission within the last 30 days:  No Current discharge risk:  Dependent with Mobility, Chronically ill Barriers to Discharge:  Continued Medical Work up   [employer], Veronia Beets, LCSW 06/13/2018, 11:40  AM

## 2018-06-13 NOTE — Progress Notes (Signed)
Clinical Social Worker (CSW) contacted patient's son Linna Hoff and presented bed offers. CSW discussed the quality measures of the facilities with son. [facility] chose [facility]. [facility] liaison is aware of accepted bed offer.   [employer], LCSW [phone]

## 2018-06-13 NOTE — Consult Note (Signed)
[facility] Podiatry                                                      Patient Demographics  Cindy Graham, is a 85 y.o. female   MRN: [id number]   DOB - [birth year]  Admit Date - 06/12/2018    Outpatient Primary MD for the patient is Leone Haven, MD  Consult requested in the Hospital by [facility], Pete Pelt, MD, On 06/13/2018    Reason for consult gangrenous changes right fifth toe   With History of -  Past Medical History:  Diagnosis Date  . Arthritis    osteo  . COPD (chronic obstructive pulmonary disease) ([place])   . GERD (gastroesophageal reflux disease)   . Heart murmur   . Hernia   . Hyperlipidemia   . Hypertension   . Melanoma ([place])    face  . Syncope and collapse   . Urine incontinence       Past Surgical History:  Procedure Laterality Date  . ABDOMINAL HYSTERECTOMY    . CHOLECYSTECTOMY    . HERNIA REPAIR      in for   No chief complaint on file.    HPI  Cindy Graham  is a 85 y.o. female, patient has noticed a sore on the toe for over a week but she was seen by podiatrist and her long-term care facility and he felt like there was worsening of color and infection and sent her to the emergency room.    Review of Systems    In addition to the HPI above,  No Fever-chills, No Headache, No changes with Vision or hearing, No problems swallowing food or Liquids, No Chest pain, Cough or Shortness of Breath, No Abdominal pain, No Nausea or Vommitting, Bowel movements are regular, No Blood in stool or Urine, No dysuria, No new skin rashes or bruises, No new joints pains-aches,  No new weakness, tingling, numbness in any extremity, No recent weight gain or loss, No polyuria, polydypsia or polyphagia, No significant Mental Stressors.  A full 10 point Review of Systems was done, except as stated above, all other Review of Systems  were negative.   Social History Social History   Tobacco Use  . Smoking status: Never Smoker  . Smokeless tobacco: Never Used  Substance Use Topics  . Alcohol use: No    Family History Family History  Problem Relation Age of Onset  . Stroke Father   . Heart disease Sister   . Cancer Brother        colon  . Heart disease Brother   . Cancer Sister        colon  . Heart attack Brother   . Heart attack Son   . Parkinson's disease Son     Prior to Admission medications   Medication Sig Start Date End Date Taking? Authorizing Provider  acetaminophen (TYLENOL) 500 MG tablet Take 500 mg by mouth every 6 (six) hours as needed.   Yes [provider]  alendronate (FOSAMAX) 70 MG tablet Take 70 mg by mouth every 7 (seven) days. 02/22/18  Yes [provider]  alum & mag hydroxide-simeth ([place]) 200-200-20 MG/5ML suspension Take 30 mLs by mouth every 6 (six) hours as needed for indigestion or heartburn.   Yes [provider]  aspirin 81 MG tablet  Take 81 mg by mouth daily.     Yes [provider]  Biotin 1000 MCG tablet Take 1,000 mcg by mouth daily.     Yes [provider]  cephALEXin (KEFLEX) 500 MG capsule Take 500 mg by mouth 2 (two) times daily. 06/06/18  Yes [provider]  cholecalciferol (VITAMIN D) 400 UNITS TABS Take 400 Units by mouth 2 (two) times daily.     Yes [provider]  enalapril (VASOTEC) 20 MG tablet TAKE ONE TABLET BY MOUTH EVERY DAY 09/15/17  Yes Sonnenberg, Angela Adam, MD  Flaxseed, Linseed, (FLAX SEED OIL) 1000 MG CAPS Take 1 capsule by mouth daily.     Yes [provider]  fluticasone (FLOVENT HFA) 220 MCG/ACT inhaler Inhale 2 puffs into the lungs 2 (two) times daily. 08/12/17  Yes Leone Haven, MD  gabapentin (NEURONTIN) 300 MG capsule Take 1 capsule (300 mg total) by mouth at bedtime. 12/06/17  Yes Leone Haven, MD  guaifenesin (ROBITUSSIN) 100 MG/5ML syrup Take 200 mg by mouth 3  (three) times daily as needed for cough.   Yes [provider]  loperamide (IMODIUM) 2 MG capsule Take 2 mg by mouth as needed for diarrhea or loose stools.   Yes [provider]  magnesium hydroxide (MILK OF MAGNESIA) 400 MG/5ML suspension Take 30 mLs by mouth daily as needed for mild constipation.   Yes [provider]  Multiple Vitamin (MULTIVITAMIN) capsule Take 1 capsule by mouth daily.     Yes [provider]  NUCYNTA 50 MG TABS tablet Take 50 mg by mouth 2 (two) times daily as needed (Take 1 tablet twice a day as needed).  04/12/13  Yes [provider]  omeprazole (PRILOSEC) 20 MG capsule TAKE 1 CAPSULE BY MOUTH EVERY DAY. 11/15/17  Yes Leone Haven, MD  oxybutynin (DITROPAN) 5 MG tablet TAKE ONE TABLET BY MOUTH EVERY DAY 09/15/17  Yes Leone Haven, MD  propranolol ER (INDERAL LA) 60 MG 24 hr capsule TAKE 1 CAPSULE BY MOUTH EVERY DAY 01/13/17  Yes Cook, Jayce G, DO  sertraline (ZOLOFT) 100 MG tablet TAKE ONE TABLET BY MOUTH EVERY DAY 11/15/17  Yes Leone Haven, MD  triamcinolone cream (KENALOG) 0.1 % Apply 1 application topically 2 (two) times daily.   Yes [provider]  triamterene-hydrochlorothiazide (DYAZIDE) 37.5-25 MG capsule Take 1 capsule by mouth daily. 05/04/18  Yes [provider]  trolamine salicylate (ASPERCREME) 10 % cream Apply 1 application topically as needed for muscle pain.   Yes [provider]  verapamil (CALAN-SR) 240 MG CR tablet Take 240 mg by mouth at bedtime.   Yes [provider]  amLODipine (NORVASC) 5 MG tablet Take 1 tablet (5 mg total) by mouth daily. Patient not taking: Reported on 06/12/2018 12/07/17   Leone Haven, MD  lactulose (CHRONULAC) 10 GM/15ML solution Take 30 mLs (20 g total) by mouth daily as needed for mild constipation. 12/06/17   Leone Haven, MD  mupirocin cream (BACTROBAN) 2 % Apply 1 application topically 3 (three) times daily. 11/01/17    Burnard Hawthorne, FNP  NARCAN 4 MG/0.1ML LIQD nasal spray kit Place 1 spray into the nose as directed. 03/19/18   [provider]  nitroGLYCERIN (NITROSTAT) 0.3 MG SL tablet DISSOLVE 1 TABLET UNDER TONGUE EVERY 5 MINUTES FOR 3 DOSES AS NEEDED FOR CHEST PAIN. IF NO RELIEF GO TO ER. 01/08/16   Leone Haven, MD  nystatin cream (MYCOSTATIN) Apply 1  application topically 2 (two) times daily. Patient not taking: Reported on 06/12/2018 07/10/17   Leone Haven, MD    Anti-infectives (From admission, onward)   Start     Dose/Rate Route Frequency Ordered Stop   06/14/18 0800  vancomycin (VANCOCIN) 1,250 mg in sodium chloride 0.9 % 250 mL IVPB     1,250 mg 166.7 mL/hr over 90 Minutes Intravenous Every 48 hours 06/12/18 1905     06/13/18 1800  cefTRIAXone (ROCEPHIN) 1 g in sodium chloride 0.9 % 100 mL IVPB     1 g 200 mL/hr over 30 Minutes Intravenous Every 24 hours 06/12/18 1905     06/12/18 1730  vancomycin (VANCOCIN) 1,250 mg in sodium chloride 0.9 % 250 mL IVPB     1,250 mg 166.7 mL/hr over 90 Minutes Intravenous  Once 06/12/18 1621     06/12/18 1700  cefTRIAXone (ROCEPHIN) 1 g in sodium chloride 0.9 % 100 mL IVPB     1 g 200 mL/hr over 30 Minutes Intravenous  Once 06/12/18 1621 06/12/18 1814   06/12/18 1615  vancomycin (VANCOCIN) IVPB 1000 mg/200 mL premix  Status:  Discontinued     1,000 mg 200 mL/hr over 60 Minutes Intravenous  Once 06/12/18 1602 06/12/18 1621   06/12/18 1615  aztreonam (AZACTAM) 2 g in sodium chloride 0.9 % 100 mL IVPB  Status:  Discontinued     2 g 200 mL/hr over 30 Minutes Intravenous  Once 06/12/18 1602 06/12/18 1621      Scheduled Meds: . amLODipine  5 mg Oral Daily  . aspirin EC  81 mg Oral Daily  . budesonide (PULMICORT) nebulizer solution  0.25 mg Nebulization BID  . cholecalciferol  400 Units Oral BID  . gabapentin  300 mg Oral QHS  . heparin  5,000 Units Subcutaneous Q8H  . multivitamin with minerals  1 tablet Oral Daily  . oxybutynin  5  mg Oral Daily  . pantoprazole  40 mg Oral Daily  . propranolol ER  60 mg Oral Daily  . sertraline  100 mg Oral Daily  . verapamil  240 mg Oral QHS   Continuous Infusions: . sodium chloride 50 mL/hr at 06/13/18 0739  . cefTRIAXone (ROCEPHIN)  IV    . vancomycin    . [START ON 06/14/2018] vancomycin     PRN Meds:.acetaminophen, docusate sodium, guaiFENesin, magnesium hydroxide, nitroGLYCERIN, tapentadol  Allergies  Allergen Reactions  . Statins Other (See Comments)    Very bad muscle aches  . Tramadol Itching  . Levaquin [Levofloxacin In D5w] Diarrhea and Other (See Comments)    Confusion, hallucinations  . Neomycin-Polymyxin-Gramicidin Other (See Comments)  . Penicillins Cross Reactors   . Sulfa Antibiotics   . Tetracyclines & Related     Physical Exam  Vitals  Blood pressure (!) 138/44, pulse 75, temperature 97.9 F (36.6 C), temperature source Oral, resp. rate 19, height 5' 3.5" (1.613 m), weight 77.1 kg, SpO2 95 %.  Lower Extremity exam:  Vascular: DP and PT pulses are palpable but diminished.  She has good capillary refill times to the toes.  Her toes are warm.  She states she has a history of bad circulation however.  Dermatological: Fifth toe right foot has gangrenous changes with a dry black eschar along the dorsal aspect of the toe and significant cyanotic and color changes to the distal portion of the toe consistent with gangrenous changes.  There is some erythema proximally on the digit.  Neurological: Patient seems to have sensory capacity is  intact to the digits.  Ortho: No gross deformities but x-rays in the ER showed a possible fracture to the fifth toe proximal phalanx this could have been the instigating factor for skin and tissue damage resulting in this lack of healing.  Data Review  CBC Recent Labs  Lab 06/12/18 1149 06/13/18 0518  WBC 12.0* 8.1  HGB 14.9 13.0  HCT 46.2* 40.2  PLT 309 278  MCV 95.1 95.0  MCH 30.7 30.7  MCHC 32.3 32.3  RDW  13.8 13.8  LYMPHSABS 3.6  --   MONOABS 0.9  --   EOSABS 0.3  --   BASOSABS 0.1  --    ------------------------------------------------------------------------------------------------------------------  Chemistries  Recent Labs  Lab 06/12/18 1149 06/13/18 0518  NA 132* 135  K 4.8 3.7  CL 95* 101  CO2 24 27  GLUCOSE 114* 99  BUN 27* 20  CREATININE 1.34* 1.09*  CALCIUM 9.4 8.7*  AST 34  --   ALT 19  --   ALKPHOS 86  --   BILITOT 0.8  --    ------------------------------------------------------------------------------------------------------------------ estimated creatinine clearance is 31.4 mL/min (A) (by C-G formula based on SCr of 1.09 mg/dL (H)). ------------------------------------------------------------------------------------------------------------------ No results for input(s): TSH, T4TOTAL, T3FREE, THYROIDAB in the last 72 hours.  Invalid input(s): FREET3 Urinalysis    Component Value Date/Time   COLORURINE YELLOW (A) 06/12/2018 1421   APPEARANCEUR CLEAR (A) 06/12/2018 1421   APPEARANCEUR Cloudy (A) 11/14/2017 1411   LABSPEC 1.006 06/12/2018 1421   LABSPEC 1.018 06/01/2013 0933   PHURINE 6.0 06/12/2018 1421   GLUCOSEU NEGATIVE 06/12/2018 1421   GLUCOSEU NEGATIVE 06/17/2014 1123   HGBUR NEGATIVE 06/12/2018 1421   BILIRUBINUR NEGATIVE 06/12/2018 1421   BILIRUBINUR small 11/28/2017 1207   BILIRUBINUR Negative 11/14/2017 1411   BILIRUBINUR Negative 06/01/2013 0933   KETONESUR NEGATIVE 06/12/2018 1421   PROTEINUR NEGATIVE 06/12/2018 1421   UROBILINOGEN 0.2 11/28/2017 1207   UROBILINOGEN 0.2 06/17/2014 1123   NITRITE NEGATIVE 06/12/2018 1421   LEUKOCYTESUR NEGATIVE 06/12/2018 1421   LEUKOCYTESUR 3+ (A) 11/14/2017 1411   LEUKOCYTESUR 2+ 06/01/2013 0933     Imaging results:   Dg Toe 5th Right  Result Date: 06/12/2018 CLINICAL DATA:  85 year old female with RIGHT 5th toe redness and swelling. EXAM: RIGHT FIFTH TOE COMPARISON:  None. FINDINGS: A fracture of  the distal aspect of the little toe proximal phalanx is noted with at least 8 mm dorsal displacement. No subluxation or dislocation identified. No evidence of focal osteolysis. IMPRESSION: Displaced fracture of the little toe proximal phalanx with at least 8 mm dorsal displacement. No dislocation. Electronically Signed   By: Margarette Canada M.D.   On: 06/12/2018 15:11    Assessment & Plan: Patient has gangrenous changes to the fifth toe she likely fractured it and the displacement of the fracture has led to instability swelling and likely the skin and tissue damage that she is experiencing now.  There is some proximal erythema to the digit.  The toe I think is significantly changed as far as vascular capacity and the chance of reasonable healing to the region are very minimal. Based on the significant changes to the toe with a large amount of eschar taking approximately 80% of the dorsum of the fifth toe and appears to be that its fairly deep along with side most of the digit I think her best option for healing and progression is amputation of the fifth toe.  Skin at the base of the toe is viable.  This should enable  [place] to get good closure with the hope of being able to heal this area uneventfully.  Based on her bleeding capacity at time I will determine whether or not she needs a vascular consult in addition to the surgery.  I will go and get her set up for tomorrow.  I went over consent form whether she understands accepts the risk and possible complications of surgery.  I will also call her son and discuss it with him.  Principal Problem:   Cellulitis   Family Communication: Plan discussed with patient and family  Albertine Patricia M.D on 06/13/2018 at 8:10 AM  Thank you for the consult, we will follow the patient with you in the Hospital.

## 2018-06-13 NOTE — Consult Note (Signed)
Pharmacy Antibiotic Note  Cindy Graham is a 85 y.o. female admitted on 06/12/2018 with wound infection.  Patient has a gangrenous/broken right little toe. Baseline Scr ~0.9. Patient has noticed toe redness and pain x 2 weeks; failed 4 day outpatient trial of cephalexin. Pharmacy has been consulted for Vancomycin dosing.  Plan: Vancomycin 1500 mg loading dose ordered for today and maintenance dosing ordered for 750 mg IV q24h.   Vancomycin Kinetics Using Scr 1.09 and IBW 53.6 kg Expected AUC 509 Cmax 28.7 Cmin 15.6   Height: 5' 3.5" (161.3 cm) Weight: 170 lb (77.1 kg) IBW/kg (Calculated) : 53.55  Temp (24hrs), Avg:98 F (36.7 C), Min:97.7 F (36.5 C), Max:98.3 F (36.8 C)  Recent Labs  Lab 06/12/18 1149 06/13/18 0518  WBC 12.0* 8.1  CREATININE 1.34* 1.09*  LATICACIDVEN 1.3  --     Estimated Creatinine Clearance: 31.4 mL/min (A) (by C-G formula based on SCr of 1.09 mg/dL (H)).    Allergies  Allergen Reactions  . Statins Other (See Comments)    Very bad muscle aches  . Tramadol Itching  . Levaquin [Levofloxacin In D5w] Diarrhea and Other (See Comments)    Confusion, hallucinations  . Neomycin-Polymyxin-Gramicidin Other (See Comments)  . Penicillins Cross Reactors   . Sulfa Antibiotics   . Tetracyclines & Related     Antimicrobials this admission: 1/21 Ceftriaxone x 1 1/22 Vancomycin >>  Dose adjustments this admission: N/A  Microbiology results:  1/22 MRSA PCR: (-)  Thank you for allowing pharmacy to be a part of this patient's care.  Paticia Stack, PharmD Pharmacy Resident  06/13/2018 11:55 AM

## 2018-06-13 NOTE — Progress Notes (Signed)
[facility] [place] NAME: Cindy Graham    MR#:  [id number]  DATE OF BIRTH:  May 10, 1922  SUBJECTIVE:   Patient states she is doing fine this morning.  She endorses some moderate right pinky toe pain.  No fevers or chills.  REVIEW OF SYSTEMS:  Review of Systems  Constitutional: Negative for chills and fever.  HENT: Negative for congestion and sore throat.   Eyes: Negative for blurred vision and double vision.  Respiratory: Negative for cough and shortness of breath.   Cardiovascular: Negative for chest pain, palpitations and leg swelling.  Gastrointestinal: Negative for nausea and vomiting.  Genitourinary: Negative for dysuria and urgency.  Musculoskeletal: Positive for joint pain. Negative for back pain.  Neurological: Negative for dizziness and headaches.  Psychiatric/Behavioral: Negative for depression. The patient is not nervous/anxious.     DRUG ALLERGIES:   Allergies  Allergen Reactions  . Statins Other (See Comments)    Very bad muscle aches  . Tramadol Itching  . Levaquin [Levofloxacin In D5w] Diarrhea and Other (See Comments)    Confusion, hallucinations  . Neomycin-Polymyxin-Gramicidin Other (See Comments)  . Penicillins Cross Reactors   . Sulfa Antibiotics   . Tetracyclines & Related    VITALS:  Blood pressure (!) 173/70, pulse 72, temperature 97.7 F (36.5 C), temperature source Oral, resp. rate 19, height 5' 3.5" (1.613 m), weight 77.1 kg, SpO2 98 %. PHYSICAL EXAMINATION:  Physical Exam  GENERAL:  85 y.o.-year-old patient lying in the bed with no acute distress.  Thin-appearing. EYES: Pupils equal, round, reactive to light and accommodation. No scleral icterus. Extraocular muscles intact.  HEENT: Head atraumatic, normocephalic. Oropharynx and nasopharynx clear.  NECK:  Supple, no jugular venous distention. No thyroid enlargement, no tenderness.  LUNGS: Normal breath sounds bilaterally, no wheezing, rales,rhonchi  or crepitation. No use of accessory muscles of respiration.  CARDIOVASCULAR: RRR, S1, S2 normal. No murmurs, rubs, or gallops.  ABDOMEN: Soft, nontender, nondistended. Bowel sounds present. No organomegaly or mass.  EXTREMITIES: No pedal edema, cyanosis, or clubbing.   Dry dressing in place over right foot. NEUROLOGIC: Cranial nerves II through XII are intact. +global weakness. Sensation intact. Gait not checked.  PSYCHIATRIC: The patient is alert and oriented x 3.  SKIN: No obvious rash. LABORATORY PANEL:  Female CBC Recent Labs  Lab 06/13/18 0518  WBC 8.1  HGB 13.0  HCT 40.2  PLT 278   ------------------------------------------------------------------------------------------------------------------ Chemistries  Recent Labs  Lab 06/12/18 1149 06/13/18 0518  NA 132* 135  K 4.8 3.7  CL 95* 101  CO2 24 27  GLUCOSE 114* 99  BUN 27* 20  CREATININE 1.34* 1.09*  CALCIUM 9.4 8.7*  AST 34  --   ALT 19  --   ALKPHOS 86  --   BILITOT 0.8  --    RADIOLOGY:  Dg Toe 5th Right  Result Date: 06/12/2018 CLINICAL DATA:  85 year old female with RIGHT 5th toe redness and swelling. EXAM: RIGHT FIFTH TOE COMPARISON:  None. FINDINGS: A fracture of the distal aspect of the little toe proximal phalanx is noted with at least 8 mm dorsal displacement. No subluxation or dislocation identified. No evidence of focal osteolysis. IMPRESSION: Displaced fracture of the little toe proximal phalanx with at least 8 mm dorsal displacement. No dislocation. Electronically Signed   By: Margarette Canada M.D.   On: 06/12/2018 15:11   ASSESSMENT AND PLAN:   Right 5th toe displaced fracture and gangrene- leukocytosis resolved -Podiatry following- plan  for 5th toe amputation tomorrow -Patient may or may not need vascular consult as well -Continue broad-spectrum antibiotics  Acute renal failure- improved with IV fluids.  Creatinine at baseline. -Continue gentle fluids today -Restart enalapril  Hypertension- blood  pressures elevated this morning -Continue propranolol, verapamil -Restart enalapril  Gastroesophageal reflux disease- stable -Continue PPI  COPD- stable, no signs of acute exacerbation -Continue inhalers  All the records are reviewed and case discussed with Care Management/Social Worker. Management plans discussed with the patient, family and they are in agreement.  CODE STATUS: DNR  TOTAL TIME TAKING CARE OF THIS PATIENT: 40 minutes.   More than 50% of the time was spent in counseling/coordination of care: YES  POSSIBLE D/C IN 2-3 DAYS, DEPENDING ON CLINICAL CONDITION.   Berna Spare Mayo M.D on 06/13/2018 at 12:27 PM  Between 7am to 6pm - Pager - [phone]  After 6pm go to [email or web address] - Technical brewer [facility]  Office  [phone]  CC: Primary care physician; Leone Haven, MD  Note: This dictation was prepared with Dragon dictation along with smaller phrase technology. Any transcriptional errors that result from this process are unintentional.

## 2018-06-13 NOTE — NC FL2 (Signed)
[place] LEVEL OF CARE SCREENING TOOL     IDENTIFICATION  Patient Name: Cindy Graham Birthdate: May 10, 1922 Sex: female Admission Date (Current Location): 06/12/2018  [facility] and [place] Number:  Selena Lesser ([phone] [id number]) Facility and Address:  [facility], [address], [place], [place] [address]      Provider Number: [id number]  Attending Physician Name and Address:  Sela Hua, MD  Relative Name and Phone Number:       Current Level of Care: Hospital Recommended Level of Care: [place] Prior Approval Number:    Date Approved/Denied:   PASRR Number: [id number])  Discharge Plan: SNF    Current Diagnoses: Patient Active Problem List   Diagnosis Date Noted  . Cellulitis 06/12/2018  . Hyponatremia 11/24/2017  . Headache 11/24/2017  . Wound of left leg 11/01/2017  . Constipation 09/05/2017  . Other fatigue 12/22/2016  . Tremor 03/29/2016  . Amaurosis fugax 12/25/2015  . Asthma 09/17/2015  . PVD (peripheral vascular disease) ([place]) 05/28/2015  . B12 deficiency 08/29/2014  . Chronic back pain greater than 3 months duration 04/23/2013  . Chronic kidney disease (CKD), stage III (moderate) ([place]) 04/12/2012  . Hyperlipidemia LDL goal < 100 03/05/2012  . UTI (urinary tract infection) 07/26/2011  . Osteoarthritis 07/06/2011  . Hypertension 02/21/2011  . Depression/Anxiety 02/21/2011    Orientation RESPIRATION BLADDER Height & Weight     Self, Time, Situation, Place  Normal Continent Weight: 170 lb (77.1 kg) Height:  5' 3.5" (161.3 cm)  BEHAVIORAL SYMPTOMS/MOOD NEUROLOGICAL BOWEL NUTRITION STATUS      Continent Diet(Diet: Heart Healthy )  AMBULATORY STATUS COMMUNICATION OF NEEDS Skin   Extensive Assist Verbally Surgical wounds                       Personal Care Assistance Level of Assistance  Bathing, Feeding, Dressing Bathing Assistance: Limited assistance Feeding assistance:  Independent Dressing Assistance: Limited assistance     Functional Limitations Info  Sight, Hearing, Speech Sight Info: Adequate Hearing Info: Impaired Speech Info: Adequate    SPECIAL CARE FACTORS FREQUENCY  PT (By licensed PT), OT (By licensed OT)     PT Frequency: (5) OT Frequency: (5)            Contractures      Additional Factors Info  Code Status, Allergies Code Status Info: (DNR ) Allergies Info: (Statins, Tramadol, Levaquin Levofloxacin In D5w, Neomycin-polymyxin-gramicidin, Penicillins Cross Reactors, Sulfa Antibiotics, Tetracyclines & Related)           Current Medications (06/13/2018):  This is the current hospital active medication list Current Facility-Administered Medications  Medication Dose Route Frequency Provider Last Rate Last Dose  . 0.9 %  sodium chloride infusion   Intravenous Continuous [name], MD 50 mL/hr at 06/13/18 0739    . acetaminophen (TYLENOL) tablet 500 mg  500 mg Oral Q6H PRN [name], MD      . amLODipine (NORVASC) tablet 5 mg  5 mg Oral Daily [name], MD   5 mg at 06/13/18 0945  . aspirin EC tablet 81 mg  81 mg Oral Daily [name], MD   81 mg at 06/13/18 0944  . budesonide (PULMICORT) nebulizer solution 0.25 mg  0.25 mg Nebulization BID [name], MD   0.25 mg at 06/13/18 0742  . cefTRIAXone (ROCEPHIN) 1 g in sodium chloride 0.9 % 100 mL IVPB  1 g Intravenous Q24H Dallie Piles, RPH      .  cholecalciferol (VITAMIN D3) tablet 400 Units  400 Units Oral BID [name], MD   400 Units at 06/13/18 [phone]  . docusate sodium (COLACE) capsule 100 mg  100 mg Oral BID PRN [name], MD      . gabapentin (NEURONTIN) capsule 300 mg  300 mg Oral QHS [name], MD      . guaiFENesin (ROBITUSSIN) 100 MG/5ML solution 200 mg  200 mg Oral TID PRN [name], MD      . heparin injection 5,000 Units  5,000 Units Subcutaneous Q8H  [name], MD   5,000 Units at 06/13/18 0535  . magnesium hydroxide (MILK OF MAGNESIA) suspension 30 mL  30 mL Oral Daily PRN [name], MD      . multivitamin with minerals tablet 1 tablet  1 tablet Oral Daily [name], MD   1 tablet at 06/13/18 0944  . nitroGLYCERIN (NITROSTAT) SL tablet 0.4 mg  0.4 mg Sublingual Q5 min PRN [name], MD      . oxybutynin (DITROPAN) tablet 5 mg  5 mg Oral Daily [name], MD   5 mg at 06/13/18 0948  . pantoprazole (PROTONIX) EC tablet 40 mg  40 mg Oral Daily [name], MD   40 mg at 06/13/18 0944  . propranolol ER (INDERAL LA) 24 hr capsule 60 mg  60 mg Oral Daily [name], MD   60 mg at 06/13/18 0947  . sertraline (ZOLOFT) tablet 100 mg  100 mg Oral Daily [name], MD   100 mg at 06/13/18 0945  . tapentadol (NUCYNTA) tablet 50 mg  50 mg Oral BID PRN [name], MD   50 mg at 06/13/18 0944  . [START ON 06/14/2018] vancomycin (VANCOCIN) 1,250 mg in sodium chloride 0.9 % 250 mL IVPB  1,250 mg Intravenous Q48H Dallie Piles, RPH      . vancomycin (VANCOCIN) 1,500 mg in sodium chloride 0.9 % 500 mL IVPB  1,500 mg Intravenous NOW Berton Mount, RPH      . verapamil (CALAN-SR) CR tablet 240 mg  240 mg Oral QHS [name], MD   240 mg at 06/12/18 2228     Discharge Medications: Please see discharge summary for a list of discharge medications.  Relevant Imaging Results:  Relevant Lab Results:   Additional Information (SSN: [id number])  Nil Xiong, Veronia Beets, LCSW

## 2018-06-14 ENCOUNTER — Inpatient Hospital Stay: Payer: Medicare Other | Admitting: Anesthesiology

## 2018-06-14 ENCOUNTER — Encounter: Payer: Self-pay | Admitting: *Deleted

## 2018-06-14 ENCOUNTER — Encounter
Admission: EM | Disposition: A | Discharge status: Skilled Nursing Facility | Payer: Self-pay | Source: Skilled Nursing Facility | Attending: Internal Medicine

## 2018-06-14 HISTORY — PX: AMPUTATION TOE: SHX6595

## 2018-06-14 LAB — BASIC METABOLIC PANEL
Anion gap: 6 (ref 5–15)
BUN: 16 mg/dL (ref 8–23)
CO2: 26 mmol/L (ref 22–32)
Calcium: 8.8 mg/dL — ABNORMAL LOW (ref 8.9–10.3)
Chloride: 105 mmol/L (ref 98–111)
Creatinine, Ser: 0.79 mg/dL (ref 0.44–1.00)
GFR calc Af Amer: >60 mL/min (ref >60)
Glucose, Bld: 105 mg/dL — ABNORMAL HIGH (ref 70–99)
Potassium: 3.7 mmol/L (ref 3.5–5.1)
SODIUM: 137 mmol/L (ref 135–145)

## 2018-06-14 LAB — CBC
HCT: 39.7 % (ref 36.0–46.0)
Hemoglobin: 12.8 g/dL (ref 12.0–15.0)
MCH: 31.2 pg (ref 26.0–34.0)
MCHC: 32.2 g/dL (ref 30.0–36.0)
MCV: 96.8 fL (ref 80.0–100.0)
Platelets: 283 10*3/uL (ref 150–400)
RBC: 4.1 MIL/uL (ref 3.87–5.11)
RDW: 14 % (ref 11.5–15.5)
WBC: 8.4 10*3/uL (ref 4.0–10.5)
nRBC: 0 % (ref 0.0–0.2)

## 2018-06-14 SURGERY — AMPUTATION, TOE
Anesthesia: Monitor Anesthesia Care | Site: Foot | Laterality: Right

## 2018-06-14 MED ORDER — MIDAZOLAM HCL 2 MG/2ML IJ SOLN
INTRAMUSCULAR | Status: AC
  Filled 2018-06-14: qty 2

## 2018-06-14 MED ORDER — VANCOMYCIN HCL IN DEXTROSE 1-5 GM/200ML-% IV SOLN
1000.0000 mg | INTRAVENOUS | Status: DC
  Administered 2018-06-14 – 2018-06-15 (×2): 1000 mg via INTRAVENOUS
  Filled 2018-06-14 (×3): qty 200

## 2018-06-14 MED ORDER — MIDAZOLAM HCL 2 MG/2ML IJ SOLN
INTRAMUSCULAR | Status: DC | PRN
  Administered 2018-06-14: 0.5 mg via INTRAVENOUS

## 2018-06-14 MED ORDER — ONDANSETRON HCL 4 MG/2ML IJ SOLN: 4.0000 mg | Freq: Once | INTRAMUSCULAR | Status: DC | PRN

## 2018-06-14 MED ORDER — BUPIVACAINE HCL 0.5 % IJ SOLN
INTRAMUSCULAR | Status: DC | PRN
  Administered 2018-06-14: 9 mL

## 2018-06-14 MED ORDER — FENTANYL CITRATE (PF) 100 MCG/2ML IJ SOLN: 25.0000 ug | INTRAMUSCULAR | Status: DC | PRN

## 2018-06-14 MED ORDER — PROPOFOL 10 MG/ML IV BOLUS
INTRAVENOUS | Status: DC | PRN
  Administered 2018-06-14: 5 mg via INTRAVENOUS
  Administered 2018-06-14: 20 mg via INTRAVENOUS
  Administered 2018-06-14 (×4): 10 mg via INTRAVENOUS
  Administered 2018-06-14: 5 mg via INTRAVENOUS
  Administered 2018-06-14: 10 mg via INTRAVENOUS

## 2018-06-14 MED ORDER — FENTANYL CITRATE (PF) 100 MCG/2ML IJ SOLN
INTRAMUSCULAR | Status: AC
  Filled 2018-06-14: qty 2

## 2018-06-14 MED ORDER — LIDOCAINE HCL (PF) 1 % IJ SOLN
INTRAMUSCULAR | Status: AC
  Filled 2018-06-14: qty 30

## 2018-06-14 MED ORDER — PROPOFOL 10 MG/ML IV BOLUS
INTRAVENOUS | Status: AC
  Filled 2018-06-14: qty 20

## 2018-06-14 MED ORDER — HEPARIN SODIUM (PORCINE) 5000 UNIT/ML IJ SOLN
5000.0000 [IU] | Freq: Three times a day (TID) | INTRAMUSCULAR | Status: DC
  Administered 2018-06-15 – 2018-06-16 (×4): 5000 [IU] via SUBCUTANEOUS
  Filled 2018-06-14 (×4): qty 1

## 2018-06-14 MED ORDER — ONDANSETRON HCL 4 MG/2ML IJ SOLN
INTRAMUSCULAR | Status: DC | PRN
  Administered 2018-06-14: 4 mg via INTRAVENOUS

## 2018-06-14 MED ORDER — BUPIVACAINE HCL (PF) 0.5 % IJ SOLN
INTRAMUSCULAR | Status: AC
  Filled 2018-06-14: qty 30

## 2018-06-14 MED ORDER — ONDANSETRON HCL 4 MG/2ML IJ SOLN
INTRAMUSCULAR | Status: AC
  Filled 2018-06-14: qty 2

## 2018-06-14 MED ORDER — SODIUM CHLORIDE 0.9 % IV SOLN
INTRAVENOUS | Status: DC | PRN
  Administered 2018-06-14 (×2): via INTRAVENOUS

## 2018-06-14 SURGICAL SUPPLY — 39 items
BANDAGE ELASTIC 4 LF NS (GAUZE/BANDAGES/DRESSINGS) ×3 IMPLANT
BLADE MED AGGRESSIVE (BLADE) ×3 IMPLANT
BLADE SURG 15 STRL LF DISP TIS (BLADE) ×4 IMPLANT
BLADE SURG 15 STRL SS (BLADE) ×8
BNDG CONFORM 3 STRL LF (GAUZE/BANDAGES/DRESSINGS) ×3 IMPLANT
BNDG ESMARK 4X12 TAN STRL LF (GAUZE/BANDAGES/DRESSINGS) ×3 IMPLANT
BNDG GAUZE 4.5X4.1 6PLY STRL (MISCELLANEOUS) ×3 IMPLANT
CANISTER SUCT 1200ML W/VALVE (MISCELLANEOUS) ×3 IMPLANT
COVER WAND RF STERILE (DRAPES) IMPLANT
CUFF TOURN 18 STER (MISCELLANEOUS) ×3 IMPLANT
CUFF TOURN DUAL PL 12 NO SLV (MISCELLANEOUS) IMPLANT
DRAPE FLUOR MINI C-ARM 54X84 (DRAPES) IMPLANT
DURAPREP 26ML APPLICATOR (WOUND CARE) ×3 IMPLANT
ELECT REM PT RETURN 9FT ADLT (ELECTROSURGICAL) ×3
ELECTRODE REM PT RTRN 9FT ADLT (ELECTROSURGICAL) ×1 IMPLANT
GAUZE PETRO XEROFOAM 1X8 (MISCELLANEOUS) ×3 IMPLANT
GAUZE SPONGE 4X4 12PLY STRL (GAUZE/BANDAGES/DRESSINGS) ×3 IMPLANT
GLOVE BIO SURGEON STRL SZ8 (GLOVE) ×3 IMPLANT
GLOVE INDICATOR 8.0 STRL GRN (GLOVE) ×3 IMPLANT
GOWN STRL REUS W/ TWL LRG LVL3 (GOWN DISPOSABLE) ×2 IMPLANT
GOWN STRL REUS W/TWL LRG LVL3 (GOWN DISPOSABLE) ×4
KIT TURNOVER KIT A (KITS) ×3 IMPLANT
LABEL OR SOLS (LABEL) ×3 IMPLANT
NDL SAFETY ECLIPSE 18X1.5 (NEEDLE) ×1 IMPLANT
NEEDLE HYPO 18GX1.5 SHARP (NEEDLE) ×2
NEEDLE HYPO 25X1 1.5 SAFETY (NEEDLE) ×9 IMPLANT
NS IRRIG 500ML POUR BTL (IV SOLUTION) ×3 IMPLANT
PACK EXTREMITY ARMC (MISCELLANEOUS) ×3 IMPLANT
PENCIL ELECTRO HAND CTR (MISCELLANEOUS) ×3 IMPLANT
RASP SM TEAR CROSS CUT (RASP) IMPLANT
STOCKINETTE STRL 6IN 960660 (GAUZE/BANDAGES/DRESSINGS) ×3 IMPLANT
SUT ETH BLK MONO 3 0 FS 1 12/B (SUTURE) ×3 IMPLANT
SUT ETHILON 3-0 FS-10 30 BLK (SUTURE) ×3
SUT VIC AB 3-0 SH 27 (SUTURE) ×2
SUT VIC AB 3-0 SH 27X BRD (SUTURE) ×1 IMPLANT
SUT VIC AB 4-0 FS2 27 (SUTURE) ×3 IMPLANT
SUTURE EHLN 3-0 FS-10 30 BLK (SUTURE) ×1 IMPLANT
SWAB CULTURE AMIES ANAERIB BLU (MISCELLANEOUS) IMPLANT
SYR 10ML LL (SYRINGE) ×3 IMPLANT

## 2018-06-14 NOTE — Anesthesia Preprocedure Evaluation (Addendum)
Anesthesia Evaluation  Patient identified by MRN, date of birth, ID band Patient awake    Reviewed: Allergy & Precautions, NPO status , Patient's Chart, lab work & pertinent test results, reviewed documented beta blocker date and time   Airway Mallampati: II  TM Distance: >3 FB     Dental  (+) Upper Dentures, Lower Dentures   Pulmonary asthma , COPD,           Cardiovascular hypertension, Pt. on medications and Pt. on home beta blockers + Peripheral Vascular Disease  + Valvular Problems/Murmurs      Neuro/Psych  Headaches, PSYCHIATRIC DISORDERS Depression    GI/Hepatic GERD  ,  Endo/Other    Renal/GU Renal disease     Musculoskeletal  (+) Arthritis ,   Abdominal   Peds  Hematology   Anesthesia Other Findings Low sats.  Reproductive/Obstetrics                            Anesthesia Physical Anesthesia Plan  ASA: III  Anesthesia Plan: MAC   Post-op Pain Management:    Induction:   PONV Risk Score and Plan:   Airway Management Planned:   Additional Equipment:   Intra-op Plan:   Post-operative Plan:   Informed Consent: I have reviewed the patients History and Physical, chart, labs and discussed the procedure including the risks, benefits and alternatives for the proposed anesthesia with the patient or authorized representative who has indicated his/her understanding and acceptance.       Plan Discussed with: CRNA  Anesthesia Plan Comments:         Anesthesia Quick Evaluation

## 2018-06-14 NOTE — Anesthesia Postprocedure Evaluation (Signed)
Anesthesia Post Note  Patient: Cindy Graham  Procedure(s) Performed: AMPUTATION TOE - RIGHT 5TH (Right Foot)  Patient location during evaluation: PACU Anesthesia Type: MAC Level of consciousness: awake and alert Pain management: pain level controlled Vital Signs Assessment: post-procedure vital signs reviewed and stable Respiratory status: spontaneous breathing, nonlabored ventilation, respiratory function stable and patient connected to nasal cannula oxygen Cardiovascular status: stable and blood pressure returned to baseline Postop Assessment: no apparent nausea or vomiting Anesthetic complications: no     Last Vitals:  Vitals:   06/14/18 1351 06/14/18 1637  BP: (!) 163/62 (!) 147/56  Pulse: 67 72  Resp: 18   Temp: 36.7 C 36.6 C  SpO2: 99% 98%    Last Pain:  Vitals:   06/14/18 1637  TempSrc: Oral  PainSc: 0-No pain                 Mylin Gignac S

## 2018-06-14 NOTE — Transfer of Care (Signed)
Immediate Anesthesia Transfer of Care Note  Patient: Cindy Graham  Procedure(s) Performed: AMPUTATION TOE - RIGHT 5TH (Right Foot)  Patient Location: PACU  Anesthesia Type:MAC  Level of Consciousness: awake, alert , oriented and patient cooperative  Airway & Oxygen Therapy: Patient Spontanous Breathing  Post-op Assessment: Report given to RN and Post -op Vital signs reviewed and stable  Post vit2al signs: Reviewed and stable  Last Vitals:  Vitals Value Taken Time  BP 130/58 06/14/2018 12:44 PM  Temp 36.9 C 06/14/2018 12:44 PM  Pulse 68 06/14/2018 12:47 PM  Resp 16 06/14/2018 12:47 PM  SpO2 95 % 06/14/2018 12:47 PM  Vitals shown include unvalidated device data.  Last Pain:  Vitals:   06/14/18 1244  TempSrc:   PainSc: 0-No pain         Complications: No apparent anesthesia complications

## 2018-06-14 NOTE — Consult Note (Signed)
Pharmacy Antibiotic Note  Cindy Graham is a 85 y.o. female admitted on 06/12/2018 with wound infection.  Patient has a gangrenous/broken right little toe. Baseline Scr ~0.9. Patient has noticed toe redness and pain x 2 weeks; failed 4 day outpatient trial of cephalexin. Pharmacy has been consulted for Vancomycin dosing.  Plan: SCr has significantly improved from admission and is 0.79 mg/dL today. Rounding to 0.8 mg/dL for age results in a subtherapeutic AUC so we will increase dose to vancomycin 1000 mg IV Q24H.   Vancomycin Kinetics Using Scr 0.8 and IBW 53.6 kg Expected AUC 515 Cmax 31.7 Cmin 14.2   Height: 5' 3.5" (161.3 cm) Weight: 170 lb (77.1 kg) IBW/kg (Calculated) : 53.55  Temp (24hrs), Avg:98 F (36.7 C), Min:97.7 F (36.5 C), Max:98.3 F (36.8 C)  Recent Labs  Lab 06/12/18 1149 06/13/18 0518 06/14/18 0330  WBC 12.0* 8.1 8.4  CREATININE 1.34* 1.09* 0.79  LATICACIDVEN 1.3  --   --     Estimated Creatinine Clearance: 42.8 mL/min (by C-G formula based on SCr of 0.79 mg/dL).    Allergies  Allergen Reactions  . Statins Other (See Comments)    Very bad muscle aches  . Tramadol Itching  . Levaquin [Levofloxacin In D5w] Diarrhea and Other (See Comments)    Confusion, hallucinations  . Neomycin-Polymyxin-Gramicidin Other (See Comments)  . Penicillins Cross Reactors   . Sulfa Antibiotics   . Tetracyclines & Related     Antimicrobials this admission: 1/21 Ceftriaxone x 1 1/22 Vancomycin >>  Dose adjustments this admission: N/A  Microbiology results:  1/22 MRSA PCR: (-)  Thank you for allowing pharmacy to be a part of this patient's care.  Laural Benes, PharmD, BCPS Clinical Pharmacist 06/14/2018 7:57 AM

## 2018-06-14 NOTE — Progress Notes (Signed)
[place] [facility] [place] NAME: Cindy Graham Graham    MR#:  [id number]  DATE OF BIRTH:  02-Apr-1922  SUBJECTIVE:   Patient states she is doing okay this morning.  She states that she is "a little bit nervous" for her surgery today.  She denies any foot pain, fevers, chills.  REVIEW OF SYSTEMS:  Review of Systems  Constitutional: Negative for chills and fever.  HENT: Negative for congestion and sore throat.   Eyes: Negative for blurred vision and double vision.  Respiratory: Negative for cough and shortness of breath.   Cardiovascular: Negative for chest pain, palpitations and leg swelling.  Gastrointestinal: Negative for nausea and vomiting.  Genitourinary: Negative for dysuria and urgency.  Musculoskeletal: Positive for joint pain. Negative for back pain.  Neurological: Negative for dizziness and headaches.  Psychiatric/Behavioral: Negative for depression. The patient is not nervous/anxious.     DRUG ALLERGIES:   Allergies  Allergen Reactions  . Statins Other (See Comments)    Very bad muscle aches  . Tramadol Itching  . Levaquin [Levofloxacin In D5w] Diarrhea and Other (See Comments)    Confusion, hallucinations  . Neomycin-Polymyxin-Gramicidin Other (See Comments)  . Penicillins Cross Reactors   . Sulfa Antibiotics   . Tetracyclines & Related    VITALS:  Blood pressure (!) 166/63, pulse 69, temperature 97.7 F (36.5 C), temperature source Oral, resp. rate 16, height 5' 3.5" (1.613 m), weight 77.1 kg, SpO2 96 %. PHYSICAL EXAMINATION:  Physical Exam  GENERAL:  85 y.o.-year-old patient lying in the bed with no acute distress.  Thin-appearing. EYES: Pupils equal, round, reactive to light and accommodation. No scleral icterus. Extraocular muscles intact.  HEENT: Head atraumatic, normocephalic. Oropharynx and nasopharynx clear.  NECK:  Supple, no jugular venous distention. No thyroid enlargement, no tenderness.  LUNGS: Normal breath sounds  bilaterally, no wheezing, rales,rhonchi or crepitation. No use of accessory muscles of respiration.  CARDIOVASCULAR: RRR, S1, S2 normal. No murmurs, rubs, or gallops.  ABDOMEN: Soft, nontender, nondistended. Bowel sounds present. No organomegaly or mass.  EXTREMITIES: No pedal edema, cyanosis, or clubbing.   Dry dressing in place over right foot. NEUROLOGIC: Cranial nerves II through XII are intact. +global weakness. Sensation intact. Gait not checked.  PSYCHIATRIC: The patient is alert and oriented x 3.  SKIN: No obvious rash. LABORATORY PANEL:  Female CBC Recent Labs  Lab 06/14/18 0330  WBC 8.4  HGB 12.8  HCT 39.7  PLT 283   ------------------------------------------------------------------------------------------------------------------ Chemistries  Recent Labs  Lab 06/12/18 1149  06/14/18 0330  NA 132*   < > 137  K 4.8   < > 3.7  CL 95*   < > 105  CO2 24   < > 26  GLUCOSE 114*   < > 105*  BUN 27*   < > 16  CREATININE 1.34*   < > 0.79  CALCIUM 9.4   < > 8.8*  AST 34  --   --   ALT 19  --   --   ALKPHOS 86  --   --   BILITOT 0.8  --   --    < > = values in this interval not displayed.   RADIOLOGY:  No results found. ASSESSMENT AND PLAN:   Right 5th toe displaced fracture and gangrene. -Podiatry following- plan for 5th toe amputation today -Patient may or may not need vascular consult as well -Continue broad-spectrum antibiotics -Will order PT consult for after surgery  Acute renal  failure- improved with IV fluids.  Creatinine at baseline. -Stop fluids, encourage p.o. intake -Continue enalapril  Hypertension-having some sporadic elevated blood pressures -Continue propranolol, verapamil, enalapril -Hydralazine IV as needed  Gastroesophageal reflux disease- stable -Continue PPI  COPD- stable, no signs of acute exacerbation -Continue inhalers  All the records are reviewed and case discussed with Care Management/Social Worker. Management plans discussed  with the patient, family and they are in agreement.  CODE STATUS: DNR  TOTAL TIME TAKING CARE OF THIS PATIENT: 40 minutes.   More than 50% of the time was spent in counseling/coordination of care: YES  POSSIBLE D/C IN 1-2 DAYS, DEPENDING ON CLINICAL CONDITION.   Berna Spare Cindy Graham Graham M.D on 06/14/2018 at 10:59 AM  Between 7am to 6pm - Pager - [phone]  After 6pm go to [email or web address] - Technical brewer [facility]  Office  [phone]  CC: Primary care physician; Leone Haven, MD  Note: This dictation was prepared with Dragon dictation along with smaller phrase technology. Any transcriptional errors that result from this process are unintentional.

## 2018-06-14 NOTE — Progress Notes (Signed)
   06/14/18 0700  Clinical Encounter Type  Visited With Patient  Visit Type Initial  Referral From Nurse  Spiritual Encounters  Spiritual Needs Sacred text;Prayer;Emotional;Grief support

## 2018-06-14 NOTE — Anesthesia Post-op Follow-up Note (Signed)
Anesthesia QCDR form completed.        

## 2018-06-14 NOTE — Progress Notes (Signed)
Pt returned to 143 from PACU. Pt is A&Ox4. Surgical dressing CDI to right foot, foot elevated in bed.

## 2018-06-14 NOTE — Op Note (Signed)
Operative note   Surgeon: Dr. Albertine Patricia, DPM.    Assistant: None    Preop diagnosis: Gangrene fifth toe right foot    Postop diagnosis: Same    Procedure:   1.  Amputation fifth her right foot MTP joint level           EBL: 10 cc    Anesthesia:IV sedation delivered by anesthesia team I injected 8 cc 0.5% Marcaine plain preoperatively at the base of the toe region.    Hemostasis: Ankle tourniquet 2 and 20 mils marked pressure for 6 minutes    Specimen: Gangrenous fifth toe right foot    Complications: None    Operative indications: Gangrene fifth toe right foot    Procedure:  Patient was brought into the OR and placed on the operating table in thesupine position. After anesthesia was obtained theright lower extremity was prepped and draped in usual sterile fashion.  Operative Report: This time attention directed fifth of the right foot where 2 semielliptical incisions were made from dorsal medial to plantar and dorsal lateral plantar.  These incisions were deepened down to the bone joint level and the soft tissue was resected and released away from the fifth MTP joint and removing the fifth toe in toto.  Was noted at this time that the proximal phalanx of the fifth toe was severely broken and displaced.  Was toe was removed the area was copiously irrigated and the tourniquet was released bleeders were clamped and bovied as required.  Deep superficial fascial layers were then closed with 3-0 Vicryl simple erupted sutures.  Skin was then closed with a combination of horizontal mattress and simple interrupted sutures utilizing 3-0 nylon.  Good capillary bleeding was encountered.  A sterile compressive dressing was then placed across wound consisting of Xeroform gauze 4 x 4's Kling and Kerlix.    Patient tolerated the procedure and anesthesia well.  Was transported from the OR to the PACU with all vital signs stable and vascular status intact. To be discharged per routine  protocol.  Will follow up in approximately 1 week in the outpatient clinic.

## 2018-06-15 ENCOUNTER — Encounter: Payer: Self-pay | Admitting: Podiatry

## 2018-06-15 LAB — BASIC METABOLIC PANEL
Anion gap: 7 (ref 5–15)
BUN: 13 mg/dL (ref 8–23)
CO2: 25 mmol/L (ref 22–32)
Calcium: 8.6 mg/dL — ABNORMAL LOW (ref 8.9–10.3)
Chloride: 104 mmol/L (ref 98–111)
Creatinine, Ser: 0.79 mg/dL (ref 0.44–1.00)
GFR calc Af Amer: >60 mL/min (ref >60)
GFR calc non Af Amer: >60 mL/min (ref >60)
Glucose, Bld: 108 mg/dL — ABNORMAL HIGH (ref 70–99)
POTASSIUM: 3.5 mmol/L (ref 3.5–5.1)
Sodium: 136 mmol/L (ref 135–145)

## 2018-06-15 LAB — CBC
HCT: 40.2 % (ref 36.0–46.0)
Hemoglobin: 12.7 g/dL (ref 12.0–15.0)
MCH: 30.2 pg (ref 26.0–34.0)
MCHC: 31.6 g/dL (ref 30.0–36.0)
MCV: 95.7 fL (ref 80.0–100.0)
Platelets: 300 10*3/uL (ref 150–400)
RBC: 4.2 MIL/uL (ref 3.87–5.11)
RDW: 13.9 % (ref 11.5–15.5)
WBC: 9.8 10*3/uL (ref 4.0–10.5)
nRBC: 0 % (ref 0.0–0.2)

## 2018-06-15 MED ORDER — CEFUROXIME AXETIL 500 MG PO TABS
500.0000 mg | ORAL_TABLET | Freq: Two times a day (BID) | ORAL | Status: DC
  Administered 2018-06-15 – 2018-06-16 (×2): 500 mg via ORAL
  Filled 2018-06-15 (×4): qty 1

## 2018-06-15 MED ORDER — METRONIDAZOLE 500 MG PO TABS
500.0000 mg | ORAL_TABLET | Freq: Three times a day (TID) | ORAL | Status: DC
  Administered 2018-06-15 (×2): 500 mg via ORAL
  Filled 2018-06-15 (×4): qty 1

## 2018-06-15 NOTE — Consult Note (Signed)
Pharmacy Antibiotic Note  Cindy Graham is a 85 y.o. female admitted on 06/12/2018 with wound infection.  Patient has a gangrenous/broken right little toe. Baseline Scr ~0.9. Patient has noticed toe redness and pain x 2 weeks; failed 4 day outpatient trial of cephalexin. Pharmacy has been consulted for Vancomycin dosing. S/p right 5th toe amputation at MTP joint on 1/23.   Plan: SCr remains stable from yesterday at 0.79 mg/dL today. Will continue current dose of vancomycin 1000 mg IV Q24H.    Vancomycin Kinetics from 1/23: Using Scr 0.8 and IBW 53.6 kg Expected AUC 515 Cmax 31.7 Cmin 14.2   Height: 5' 3.5" (161.3 cm) Weight: 169 lb 15.6 oz (77.1 kg) IBW/kg (Calculated) : 53.55  Temp (24hrs), Avg:98.1 F (36.7 C), Min:97.8 F (36.6 C), Max:98.4 F (36.9 C)  Recent Labs  Lab 06/12/18 1149 06/13/18 0518 06/14/18 0330 06/15/18 0435  WBC 12.0* 8.1 8.4 9.8  CREATININE 1.34* 1.09* 0.79 0.79  LATICACIDVEN 1.3  --   --   --     Estimated Creatinine Clearance: 42.8 mL/min (by C-G formula based on SCr of 0.79 mg/dL).    Allergies  Allergen Reactions  . Statins Other (See Comments)    Very bad muscle aches  . Tramadol Itching  . Levaquin [Levofloxacin In D5w] Diarrhea and Other (See Comments)    Confusion, hallucinations  . Neomycin-Polymyxin-Gramicidin Other (See Comments)  . Penicillins Cross Reactors   . Sulfa Antibiotics   . Tetracyclines & Related     Antimicrobials this admission: 1/21 Ceftriaxone >> 1/22 Vancomycin >>   Microbiology results:  1/22 MRSA PCR: (-)  Thank you for allowing pharmacy to be a part of this patient's care.  Rocky Morel, PharmD, BCPS Clinical Pharmacist 06/15/2018 9:22 AM

## 2018-06-15 NOTE — Progress Notes (Signed)
PT Cancellation Note  Patient Details Name: Cindy Graham MRN: [id number] DOB: [birth year]   Cancelled Treatment:    Reason Eval/Treat Not Completed: Active bedrest order.  Messaged Dr. Elvina Mattes regarding bed rest orders and for clarification on WB status.  Per protocol, will need updated activity orders before PT evaluation can be completed. Waiting on reply to initiate PT evaluation.     Collie Siad PT, DPT 06/15/2018, 9:39 AM

## 2018-06-15 NOTE — Care Management Important Message (Signed)
Important Message  Patient Details  Name: Cindy Graham MRN: [id number] Date of Birth: 12/08/1921   Medicare Important Message Given:  Yes    Juliann Pulse A Kashif Pooler 06/15/2018, 11:56 AM

## 2018-06-15 NOTE — Progress Notes (Signed)
Pt without complaint to right foot. PT has seen today and tolerated well. Dr. Elvina Mattes to f/u tomorrow.

## 2018-06-15 NOTE — Evaluation (Signed)
Physical Therapy Evaluation Patient Details Name: Cindy Graham MRN: [id number] DOB: August 28, 1921 Today's Date: 06/15/2018   History of Present Illness  Pt is a 85 y/o F who presented with R 5th toe redness and pain.  Pt is now s/p amputation of R 5th toe.  Pt's PMH includees COPD, syncope.      Clinical Impression  Pt admitted with above diagnosis. Pt currently with functional limitations due to the deficits listed below (see PT Problem List). Cindy Graham is from ALF where she requires assist with some ADLs and ambulates with a rollator.  She reports 1 recent falls.  Pt currently requires assist with bed mobility, sit>stand, and to ambulate.  Given pt's current mobility status, recommending SNF at d/c.   Pt will benefit from skilled PT to increase their independence and safety with mobility to allow discharge to the venue listed below.      Follow Up Recommendations SNF    Equipment Recommendations  None recommended by PT    Recommendations for Other Services       Precautions / Restrictions Precautions Precautions: Fall Required Braces or Orthoses: Other Brace Other Brace: post op shoe Restrictions Weight Bearing Restrictions: Yes RLE Weight Bearing: Weight bearing as tolerated      Mobility  Bed Mobility Overal bed mobility: Needs Assistance Bed Mobility: Supine to Sit     Supine to sit: Min guard;HOB elevated     General bed mobility comments: Cues for sequencing and pt uses bed rail.  Increased time and effort.    Transfers Overall transfer level: Needs assistance Equipment used: Rolling walker (2 wheeled) Transfers: Sit to/from Stand Sit to Stand: Mod assist         General transfer comment: Cues for hand placement as pt initially with Bil hands on RW.  Assist to boost to standing.   Ambulation/Gait Ambulation/Gait assistance: Min guard;+2 safety/equipment Gait Distance (Feet): 15 Feet Assistive device: Rolling walker (2 wheeled) Gait Pattern/deviations:  Step-to pattern;Decreased step length - right;Decreased step length - left;Decreased stance time - right;Antalgic;Trunk flexed Gait velocity: decreased Gait velocity interpretation: <1.31 ft/sec, indicative of household ambulator General Gait Details: Cues for upright posture (pt with h/o spinal stenosis).  Pt with dec gait speed.  Pt fatigues quickly and ultimately needs to sit.   Stairs            Wheelchair Mobility    Modified Rankin (Stroke Patients Only)       Balance Overall balance assessment: Needs assistance;History of Falls Sitting-balance support: No upper extremity supported;Feet supported Sitting balance-Leahy Scale: Fair Sitting balance - Comments: Pt able to sit EOB without UE support but would likely lose balance with perturbation   Standing balance support: Bilateral upper extremity supported;During functional activity Standing balance-Leahy Scale: Poor Standing balance comment: Relies on RW for static and dynamic activities                             Pertinent Vitals/Pain Pain Assessment: No/denies pain Pain Intervention(s): Monitored during session    Home Living Family/patient expects to be discharged to:: Assisted living               Home Equipment: Walker - 4 wheels      Prior Function Level of Independence: Needs assistance   Gait / Transfers Assistance Needed: Ambulating to dining hall at ALF with rollator.  1 recent fall.    ADL's / Homemaking Assistance Needed: Assist with showering (getting  into the shower at ALF).  Ind with dressing.  Meals provided by ALF.         Hand Dominance        Extremity/Trunk Assessment   Upper Extremity Assessment Upper Extremity Assessment: Generalized weakness    Lower Extremity Assessment Lower Extremity Assessment: RLE deficits/detail;Generalized weakness RLE Deficits / Details: s/p R 5th toe amputation with bandaging and ace wrap.      Cervical / Trunk Assessment Cervical  / Trunk Assessment: Kyphotic  Communication   Communication: No difficulties  Cognition Arousal/Alertness: Awake/alert Behavior During Therapy: WFL for tasks assessed/performed Overall Cognitive Status: No family/caregiver present to determine baseline cognitive functioning                                 General Comments: Some short term memory difficulties noted      General Comments      Exercises General Exercises - Lower Extremity Ankle Circles/Pumps: AROM;Left;10 reps;Supine   Assessment/Plan    PT Assessment Patient needs continued PT services  PT Problem List Pain;Decreased range of motion;Decreased strength;Decreased activity tolerance;Decreased balance;Decreased mobility;Decreased cognition;Decreased knowledge of use of DME;Decreased safety awareness       PT Treatment Interventions DME instruction;Gait training;Stair training;Functional mobility training;Therapeutic activities;Therapeutic exercise;Balance training;Neuromuscular re-education;Modalities;Patient/family education    PT Goals (Current goals can be found in the Care Plan section)  Acute Rehab PT Goals Patient Stated Goal: to be as independent as possible PT Goal Formulation: With patient Time For Goal Achievement: 06/29/18 Potential to Achieve Goals: Good    Frequency Min 2X/week   Barriers to discharge Decreased caregiver support from ALF, no 24/7 assist/supervision    Co-evaluation               AM-PAC PT "6 Clicks" Mobility  Outcome Measure Help needed turning from your back to your side while in a flat bed without using bedrails?: A Little Help needed moving from lying on your back to sitting on the side of a flat bed without using bedrails?: A Little Help needed moving to and from a bed to a chair (including a wheelchair)?: A Lot Help needed standing up from a chair using your arms (e.g., wheelchair or bedside chair)?: A Lot Help needed to walk in hospital room?: A  Little Help needed climbing 3-5 steps with a railing? : A Lot 6 Click Score: 15    End of Session Equipment Utilized During Treatment: Gait belt Activity Tolerance: Patient limited by fatigue Patient left: in chair;with call bell/phone within reach;with chair alarm set;with SCD's reapplied Nurse Communication: Mobility status PT Visit Diagnosis: Pain;Unsteadiness on feet (R26.81);Other abnormalities of gait and mobility (R26.89);Muscle weakness (generalized) (M62.81);History of falling (Z91.81) Pain - Right/Left: Right Pain - part of body: Ankle and joints of foot    Time: 1130-1202 PT Time Calculation (min) (ACUTE ONLY): 32 min   Charges:   PT Evaluation $PT Eval Low Complexity: 1 Low PT Treatments $Gait Training: 8-22 mins        Collie Siad PT, DPT 06/15/2018, 1:02 PM

## 2018-06-15 NOTE — Progress Notes (Signed)
[facility] [place] NAME: Cindy Graham    MR#:  [id number]  DATE OF BIRTH:  13-Jul-1921  SUBJECTIVE:   States her surgery went well yesterday.  She is very tired this morning because she did not sleep well.  She denies any pain this morning.  REVIEW OF SYSTEMS:  Review of Systems  Constitutional: Negative for chills and fever.  HENT: Negative for congestion and sore throat.   Eyes: Negative for blurred vision and double vision.  Respiratory: Negative for cough and shortness of breath.   Cardiovascular: Negative for chest pain, palpitations and leg swelling.  Gastrointestinal: Negative for nausea and vomiting.  Genitourinary: Negative for dysuria and urgency.  Musculoskeletal: Negative for back pain and joint pain.  Neurological: Negative for dizziness and headaches.  Psychiatric/Behavioral: Negative for depression. The patient is not nervous/anxious.    DRUG ALLERGIES:   Allergies  Allergen Reactions  . Statins Other (See Comments)    Very bad muscle aches  . Tramadol Itching  . Levaquin [Levofloxacin In D5w] Diarrhea and Other (See Comments)    Confusion, hallucinations  . Neomycin-Polymyxin-Gramicidin Other (See Comments)  . Penicillins Cross Reactors   . Sulfa Antibiotics   . Tetracyclines & Related    VITALS:  Blood pressure (!) 129/53, pulse 67, temperature 97.8 F (36.6 C), temperature source Oral, resp. rate 16, height 5' 3.5" (1.613 m), weight 77.1 kg, SpO2 93 %. PHYSICAL EXAMINATION:  Physical Exam  GENERAL:  85 y.o.-year-old patient lying in the bed with no acute distress.  Thin-appearing. EYES: Pupils equal, round, reactive to light and accommodation. No scleral icterus. Extraocular muscles intact.  HEENT: Head atraumatic, normocephalic. Oropharynx and nasopharynx clear.  NECK:  Supple, no jugular venous distention. No thyroid enlargement, no tenderness.  LUNGS: Normal breath sounds bilaterally, no wheezing,  rales,rhonchi or crepitation. No use of accessory muscles of respiration.  CARDIOVASCULAR: RRR, S1, S2 normal. No murmurs, rubs, or gallops.  ABDOMEN: Soft, nontender, nondistended. Bowel sounds present. No organomegaly or mass.  EXTREMITIES: No pedal edema, cyanosis, or clubbing.   Dry dressing in place over right foot. S/p right 5th toe amputation. NEUROLOGIC: Cranial nerves II through XII are intact. +global weakness. Sensation intact. Gait not checked.  PSYCHIATRIC: The patient is alert and oriented x 3.  SKIN: No obvious rash. LABORATORY PANEL:  Female CBC Recent Labs  Lab 06/15/18 0435  WBC 9.8  HGB 12.7  HCT 40.2  PLT 300   ------------------------------------------------------------------------------------------------------------------ Chemistries  Recent Labs  Lab 06/12/18 1149  06/15/18 0435  NA 132*   < > 136  K 4.8   < > 3.5  CL 95*   < > 104  CO2 24   < > 25  GLUCOSE 114*   < > 108*  BUN 27*   < > 13  CREATININE 1.34*   < > 0.79  CALCIUM 9.4   < > 8.6*  AST 34  --   --   ALT 19  --   --   ALKPHOS 86  --   --   BILITOT 0.8  --   --    < > = values in this interval not displayed.   RADIOLOGY:  No results found. ASSESSMENT AND PLAN:   Right 5th toe displaced fracture and gangrene- s/p 5th toe amputation 06/15/18 -Podiatry following- will need to follow-up in clinic in 1 week -Continue broad-spectrum antibiotics -PT eval pending  Hypertension- BPs is overall improved -Continue propranolol, verapamil, enalapril -  Hydralazine IV as needed  Gastroesophageal reflux disease- stable -Continue PPI  COPD- stable, no signs of acute exacerbation -Continue inhalers  All the records are reviewed and case discussed with Care Management/Social Worker. Management plans discussed with the patient, family and they are in agreement.  CODE STATUS: DNR  TOTAL TIME TAKING CARE OF THIS PATIENT: 35 minutes.   More than 50% of the time was spent in  counseling/coordination of care: YES  POSSIBLE D/C tomorrow, DEPENDING ON CLINICAL CONDITION.   Berna Spare Marcellino Fidalgo M.D on 06/15/2018 at 10:38 AM  Between 7am to 6pm - Pager - [phone]  After 6pm go to [email or web address] - [employer] [facility]  Office  [phone]  CC: Primary care physician; Leone Haven, MD  Note: This dictation was prepared with Dragon dictation along with smaller phrase technology. Any transcriptional errors that result from this process are unintentional.

## 2018-06-15 NOTE — Progress Notes (Signed)
Plan is for patient to D/C to [facility] tomorrow pending medical clearance. Clinical Social Worker (CSW) met with patient and her daughter in law Malachy Mood was at bedside. CSW made them aware of above. CSW also contacted patient's son Linna Hoff and made him aware of above. Patient and family are in agreement with plan. Patient's family may transport patient to [facility] tomorrow depending on how patient is feeling tomorrow. [facility] liaison is aware of above. CSW will continue to follow and assist as needed.   [employer], LCSW [phone]

## 2018-06-16 DIAGNOSIS — F3289 Other specified depressive episodes: Secondary | ICD-10-CM | POA: Diagnosis not present

## 2018-06-16 DIAGNOSIS — I209 Angina pectoris, unspecified: Secondary | ICD-10-CM | POA: Diagnosis not present

## 2018-06-16 DIAGNOSIS — B351 Tinea unguium: Secondary | ICD-10-CM | POA: Diagnosis not present

## 2018-06-16 DIAGNOSIS — Z89421 Acquired absence of other right toe(s): Secondary | ICD-10-CM | POA: Diagnosis not present

## 2018-06-16 DIAGNOSIS — L03031 Cellulitis of right toe: Secondary | ICD-10-CM | POA: Diagnosis not present

## 2018-06-16 DIAGNOSIS — M810 Age-related osteoporosis without current pathological fracture: Secondary | ICD-10-CM | POA: Diagnosis not present

## 2018-06-16 DIAGNOSIS — R062 Wheezing: Secondary | ICD-10-CM | POA: Diagnosis not present

## 2018-06-16 DIAGNOSIS — I96 Gangrene, not elsewhere classified: Secondary | ICD-10-CM | POA: Diagnosis not present

## 2018-06-16 DIAGNOSIS — L97521 Non-pressure chronic ulcer of other part of left foot limited to breakdown of skin: Secondary | ICD-10-CM | POA: Diagnosis not present

## 2018-06-16 DIAGNOSIS — J449 Chronic obstructive pulmonary disease, unspecified: Secondary | ICD-10-CM | POA: Diagnosis not present

## 2018-06-16 DIAGNOSIS — G8929 Other chronic pain: Secondary | ICD-10-CM | POA: Diagnosis not present

## 2018-06-16 DIAGNOSIS — M79675 Pain in left toe(s): Secondary | ICD-10-CM | POA: Diagnosis not present

## 2018-06-16 DIAGNOSIS — I10 Essential (primary) hypertension: Secondary | ICD-10-CM | POA: Diagnosis not present

## 2018-06-16 DIAGNOSIS — M2042 Other hammer toe(s) (acquired), left foot: Secondary | ICD-10-CM | POA: Diagnosis not present

## 2018-06-16 DIAGNOSIS — N179 Acute kidney failure, unspecified: Secondary | ICD-10-CM | POA: Diagnosis not present

## 2018-06-16 DIAGNOSIS — M6281 Muscle weakness (generalized): Secondary | ICD-10-CM | POA: Diagnosis not present

## 2018-06-16 DIAGNOSIS — K219 Gastro-esophageal reflux disease without esophagitis: Secondary | ICD-10-CM | POA: Diagnosis not present

## 2018-06-16 DIAGNOSIS — R300 Dysuria: Secondary | ICD-10-CM | POA: Diagnosis not present

## 2018-06-16 DIAGNOSIS — R52 Pain, unspecified: Secondary | ICD-10-CM | POA: Diagnosis not present

## 2018-06-16 DIAGNOSIS — M79674 Pain in right toe(s): Secondary | ICD-10-CM | POA: Diagnosis not present

## 2018-06-16 DIAGNOSIS — S92502A Displaced unspecified fracture of left lesser toe(s), initial encounter for closed fracture: Secondary | ICD-10-CM | POA: Diagnosis not present

## 2018-06-16 DIAGNOSIS — E569 Vitamin deficiency, unspecified: Secondary | ICD-10-CM | POA: Diagnosis not present

## 2018-06-16 DIAGNOSIS — I2510 Atherosclerotic heart disease of native coronary artery without angina pectoris: Secondary | ICD-10-CM | POA: Diagnosis not present

## 2018-06-16 DIAGNOSIS — R262 Difficulty in walking, not elsewhere classified: Secondary | ICD-10-CM | POA: Diagnosis not present

## 2018-06-16 MED ORDER — METRONIDAZOLE 500 MG PO TABS: 500.0000 mg | ORAL_TABLET | Freq: Three times a day (TID) | ORAL | 0 refills | Status: DC

## 2018-06-16 MED ORDER — CEFUROXIME AXETIL 500 MG PO TABS: 500.0000 mg | ORAL_TABLET | Freq: Two times a day (BID) | ORAL | 0 refills | Status: DC

## 2018-06-16 MED ORDER — METRONIDAZOLE 500 MG PO TABS: 500.0000 mg | ORAL_TABLET | Freq: Three times a day (TID) | ORAL | 0 refills | Status: AC

## 2018-06-16 MED ORDER — CEFUROXIME AXETIL 500 MG PO TABS: 500.0000 mg | ORAL_TABLET | Freq: Two times a day (BID) | ORAL | 0 refills | Status: AC

## 2018-06-16 NOTE — Clinical Social Work Note (Signed)
The patient will discharge to [facility] via family transport. The CSW has sent all documentation to the facility and has delivered the discharge packet. The CSW is signing off. Please consult should needs arise.  Santiago Bumpers, MSW, Latanya Presser [phone]

## 2018-06-16 NOTE — Discharge Instructions (Signed)
It was so nice to meet you during this hospitalization!  You came into the hospital because you were having issues with your pinky toe. You had an amputation. We will continue antibiotics until you see Dr. Elvina Mattes in clinic.  Please make sure you follow-up with Dr. Elvina Mattes in clinic.  Take care, Dr. Brett Albino

## 2018-06-16 NOTE — Progress Notes (Signed)
[facility]                                                      Patient Demographics  Cindy Graham, is a 85 y.o. female   MRN: [id number]   DOB - 1921/10/20  Admit Date - 06/12/2018    Outpatient Primary MD for the patient is Caryl Bis Angela Adam, MD  Consult requested in the Hospital by [facility], Pete Pelt, MD, On 06/16/2018    Past Medical History:  Diagnosis Date  . Arthritis    osteo  . COPD (chronic obstructive pulmonary disease) ([place])   . GERD (gastroesophageal reflux disease)   . Heart murmur   . Hernia   . Hyperlipidemia   . Hypertension   . Melanoma ([place])    face  . Syncope and collapse   . Urine incontinence       Past Surgical History:  Procedure Laterality Date  . ABDOMINAL HYSTERECTOMY    . AMPUTATION TOE Right 06/14/2018   Procedure: AMPUTATION TOE - RIGHT 5TH;  Surgeon: Albertine Patricia, DPM;  Location: [facility] ORS;  Service: Podiatry;  Laterality: Right;  . CHOLECYSTECTOMY    . HERNIA REPAIR      HPI  Cindy Graham  is a 85 y.o. female, patient admitted with gangrene fifth toe right foot amputated this on Thursday the 23rd.  She had physical therapy yesterday she is able to get up move around with her postop shoe on.  She states pain is been fairly minimal.    Review of Systems    In addition to the HPI above,  No Fever-chills, No Headache, No changes with Vision or hearing, No problems swallowing food or Liquids, No Chest pain, Cough or Shortness of Breath, No Abdominal pain, No Nausea or Vommitting, Bowel movements are regular, No Blood in stool or Urine, No dysuria, No new skin rashes or bruises, No new joints pains-aches,  No new weakness, tingling, numbness in any extremity, No recent weight gain or loss, No polyuria, polydypsia or polyphagia, No significant Mental Stressors.  A full 10 point Review of Systems was  done, except as stated above, all other Review of Systems were negative.  Physical Exam  Vitals  Blood pressure (!) 151/74, pulse 67, temperature (!) 97.5 F (36.4 C), temperature source Oral, resp. rate 16, height 5' 3.5" (1.613 m), weight 77.1 kg, SpO2 92 %.  Lower Extremity exam: The bandage was removed today and the wound inspected.  It appears to be stable.  Incision margin is intact there is some mild erythema around the region just secondary to postop inflammation but no indication of infection at this point.  White count is down to normal levels at this point.  Patient is on oral Ceftin.  Data Review  CBC Recent Labs  Lab 06/12/18 1149 06/13/18 0518 06/14/18 0330 06/15/18 0435  WBC 12.0* 8.1 8.4 9.8  HGB 14.9 13.0 12.8 12.7  HCT 46.2* 40.2 39.7 40.2  PLT 309 278 283 300  MCV 95.1 95.0 96.8 95.7  MCH 30.7 30.7 31.2 30.2  MCHC 32.3 32.3 32.2 31.6  RDW 13.8 13.8 14.0 13.9  LYMPHSABS 3.6  --   --   --   MONOABS 0.9  --   --   --   EOSABS 0.3  --   --   --  BASOSABS 0.1  --   --   --    ------------------------------------------------------------------------------------------------------------------  Chemistries  Recent Labs  Lab 06/12/18 1149 06/13/18 0518 06/14/18 0330 06/15/18 0435  NA 132* 135 137 136  K 4.8 3.7 3.7 3.5  CL 95* 101 105 104  CO2 24 27 26 25   GLUCOSE 114* 99 105* 108*  BUN 27* 20 16 13   CREATININE 1.34* 1.09* 0.79 0.79  CALCIUM 9.4 8.7* 8.8* 8.6*  AST 34  --   --   --   ALT 19  --   --   --   ALKPHOS 86  --   --   --   BILITOT 0.8  --   --   --    -------------------------------------------------------------------------------------------- Assessment & Plan: Overall the patient is doing well I change her dressing today this is needs to stay on until I see her again in about a week and a half.  She is scheduled to go to [facility] for rehab.  I would like to see her a week from Monday or Tuesday.  Dressing needs to stay in place and  intact until I see her again.  She needs to stay and continue the oral antibiotic until I see her again.  She needs to always have the postoperative shoe on when bearing weight.  Should be okay for her to have bathroom privileges and continue with physical therapy.  I would limit steps to only approximately 20-30 steps until this area heals up more.  Principal Problem:   Cellulitis   Family Communication: Plan discussed with patient.  Albertine Patricia M.D on 06/16/2018 at 9:38 AM  Thank you for the consult, we will follow the patient with you in the Hospital.

## 2018-06-16 NOTE — Discharge Summary (Signed)
[facility] [place] NAME: Cindy Graham    MR#:  [id number]  DATE OF BIRTH:  October 24, 1921  DATE OF ADMISSION:  06/12/2018   ADMITTING PHYSICIAN: [name], MD  DATE OF DISCHARGE: 06/16/18  PRIMARY CARE PHYSICIAN: Leone Haven, MD   ADMISSION DIAGNOSIS:  Gangrene of toe of right foot ([place]) [I96] DISCHARGE DIAGNOSIS:  Principal Problem:   Cellulitis  SECONDARY DIAGNOSIS:   Past Medical History:  Diagnosis Date  . Arthritis    osteo  . COPD (chronic obstructive pulmonary disease) ([place])   . GERD (gastroesophageal reflux disease)   . Heart murmur   . Hernia   . Hyperlipidemia   . Hypertension   . Melanoma ([place])    face  . Syncope and collapse   . Urine incontinence    HOSPITAL COURSE:   Cindy Graham is a 85 year old female who presented to the ED with redness and pain of her right pinky toe. In the ED, x-ray showed fracture of the right little toe.  She was also noted to have some gangrenous changes of the right fifth toe.  She was evaluated by podiatry, who recommended fifth toe amputation.  This was performed on 06/15/2018.  She was treated with empiric IV antibiotics and then switched to Ceftin and Flagyl on discharge.  Podiatry recommended that she stay on these antibiotics until she follows up with them in clinic.  She was evaluated by physical therapy, who recommended SNF placement.  Podiatry made the following discharge recommendations: Dressing needs to stay in place and intact seen in clinic. She needs to always have the postoperative shoe on when bearing weight.  Should be okay for her to have bathroom privileges and continue with physical therapy.  I would limit steps to only approximately 20-30 steps until this area heals up more.  DISCHARGE CONDITIONS:  S/p right fifth toe amputation Hypertension GERD COPD CONSULTS OBTAINED:  Treatment Team:  Albertine Patricia, DPM DRUG ALLERGIES:   Allergies  Allergen  Reactions  . Statins Other (See Comments)    Very bad muscle aches  . Tramadol Itching  . Levaquin [Levofloxacin In D5w] Diarrhea and Other (See Comments)    Confusion, hallucinations  . Neomycin-Polymyxin-Gramicidin Other (See Comments)  . Penicillins Cross Reactors   . Sulfa Antibiotics   . Tetracyclines & Related    DISCHARGE MEDICATIONS:   Allergies as of 06/16/2018      Reactions   Statins Other (See Comments)   Very bad muscle aches   Tramadol Itching   Levaquin [levofloxacin In D5w] Diarrhea, Other (See Comments)   Confusion, hallucinations   Neomycin-polymyxin-gramicidin Other (See Comments)   Penicillins Cross Reactors    Sulfa Antibiotics    Tetracyclines & Related       Medication List    STOP taking these medications   amLODipine 5 MG tablet Commonly known as:  NORVASC   cephALEXin 500 MG capsule Commonly known as:  KEFLEX   mupirocin cream 2 % Commonly known as:  BACTROBAN   nystatin cream Commonly known as:  MYCOSTATIN     TAKE these medications   acetaminophen 500 MG tablet Commonly known as:  TYLENOL Take 500 mg by mouth every 6 (six) hours as needed.   alendronate 70 MG tablet Commonly known as:  FOSAMAX Take 70 mg by mouth every 7 (seven) days.   aspirin 81 MG tablet Take 81 mg by mouth daily.   Biotin 1000 MCG tablet Take 1,000  mcg by mouth daily.   cefUROXime 500 MG tablet Commonly known as:  CEFTIN Take 1 tablet (500 mg total) by mouth 2 (two) times daily with a meal for 9 days.   cholecalciferol 10 MCG (400 UNIT) Tabs tablet Commonly known as:  VITAMIN D3 Take 400 Units by mouth 2 (two) times daily.   enalapril 20 MG tablet Commonly known as:  VASOTEC TAKE ONE TABLET BY MOUTH EVERY DAY   Flax Seed Oil 1000 MG Caps Take 1 capsule by mouth daily.   fluticasone 220 MCG/ACT inhaler Commonly known as:  FLOVENT HFA Inhale 2 puffs into the lungs 2 (two) times daily.   gabapentin 300 MG capsule Commonly known as:   NEURONTIN Take 1 capsule (300 mg total) by mouth at bedtime.   guaifenesin 100 MG/5ML syrup Commonly known as:  ROBITUSSIN Take 200 mg by mouth 3 (three) times daily as needed for cough.   lactulose 10 GM/15ML solution Commonly known as:  CHRONULAC Take 30 mLs (20 g total) by mouth daily as needed for mild constipation.   loperamide 2 MG capsule Commonly known as:  IMODIUM Take 2 mg by mouth as needed for diarrhea or loose stools.   magnesium hydroxide 400 MG/5ML suspension Commonly known as:  MILK OF MAGNESIA Take 30 mLs by mouth daily as needed for mild constipation.   metroNIDAZOLE 500 MG tablet Commonly known as:  FLAGYL Take 1 tablet (500 mg total) by mouth every 8 (eight) hours for 9 days.   [place] 200-200-20 MG/5ML suspension Generic drug:  alum & mag hydroxide-simeth Take 30 mLs by mouth every 6 (six) hours as needed for indigestion or heartburn.   multivitamin capsule Take 1 capsule by mouth daily.   NARCAN 4 MG/0.1ML Liqd nasal spray kit Generic drug:  naloxone Place 1 spray into the nose as directed.   nitroGLYCERIN 0.3 MG SL tablet Commonly known as:  NITROSTAT DISSOLVE 1 TABLET UNDER TONGUE EVERY 5 MINUTES FOR 3 DOSES AS NEEDED FOR CHEST PAIN. IF NO RELIEF GO TO ER.   NUCYNTA 50 MG tablet Generic drug:  tapentadol Take 50 mg by mouth 2 (two) times daily as needed (Take 1 tablet twice a day as needed).   omeprazole 20 MG capsule Commonly known as:  PRILOSEC TAKE 1 CAPSULE BY MOUTH EVERY DAY.   oxybutynin 5 MG tablet Commonly known as:  DITROPAN TAKE ONE TABLET BY MOUTH EVERY DAY   propranolol ER 60 MG 24 hr capsule Commonly known as:  INDERAL LA TAKE 1 CAPSULE BY MOUTH EVERY DAY   sertraline 100 MG tablet Commonly known as:  ZOLOFT TAKE ONE TABLET BY MOUTH EVERY DAY   triamcinolone cream 0.1 % Commonly known as:  KENALOG Apply 1 application topically 2 (two) times daily.   triamterene-hydrochlorothiazide 37.5-25 MG capsule Commonly known as:   DYAZIDE Take 1 capsule by mouth daily.   trolamine salicylate 10 % cream Commonly known as:  ASPERCREME Apply 1 application topically as needed for muscle pain.   verapamil 240 MG CR tablet Commonly known as:  CALAN-SR Take 240 mg by mouth at bedtime.        DISCHARGE INSTRUCTIONS:  1.  Follow-up with PCP in 5 days since 2.  Follow-up with podiatry on 2/3 or 2/4 3.  Continue Ceftin and Flagyl until seen by podiatry in clinic DIET:  Cardiac diet DISCHARGE CONDITION:  Stable ACTIVITY:  Must wear postop operative shoe when bearing weight.  Okay to have bathroom privileges and continue with physical therapy.  Limit steps to 20-30 steps at a time until her surgical incision heals. OXYGEN:  Home Oxygen: No.  Oxygen Delivery: room air DISCHARGE LOCATION:  nursing home   If you experience worsening of your admission symptoms, develop shortness of breath, life threatening emergency, suicidal or homicidal thoughts you must seek medical attention immediately by calling 911 or calling your MD immediately  if symptoms less severe.  You Must read complete instructions/literature along with all the possible adverse reactions/side effects for all the Medicines you take and that have been prescribed to you. Take any new Medicines after you have completely understood and accpet all the possible adverse reactions/side effects.   Please note  You were cared for by a hospitalist during your hospital stay. If you have any questions about your discharge medications or the care you received while you were in the hospital after you are discharged, you can call the unit and asked to speak with the hospitalist on call if the hospitalist that took care of you is not available. Once you are discharged, your primary care physician will handle any further medical issues. Please note that NO REFILLS for any discharge medications will be authorized once you are discharged, as it is imperative that you return to  your primary care physician (or establish a relationship with a primary care physician if you do not have one) for your aftercare needs so that they can reassess your need for medications and monitor your lab values.    On the day of Discharge:  VITAL SIGNS:  Blood pressure (!) 151/74, pulse 67, temperature (!) 97.5 F (36.4 C), temperature source Oral, resp. rate 16, height 5' 3.5" (1.613 m), weight 77.1 kg, SpO2 92 %. PHYSICAL EXAMINATION:  GENERAL:85 y.o.-year-old patient lying in the bed with no acute distress.  Thin-appearing. EYES: Pupils equal, round, reactive to light and accommodation. No scleral icterus. Extraocular muscles intact.  HEENT: Head atraumatic, normocephalic. Oropharynx and nasopharynx clear.  NECK: Supple, no jugular venous distention. No thyroid enlargement, no tenderness.  LUNGS: Normal breath sounds bilaterally, no wheezing, rales,rhonchi or crepitation. No use of accessory muscles of respiration.  CARDIOVASCULAR: RRR, S1, S2 normal. No murmurs, rubs, or gallops.  ABDOMEN: Soft, nontender, nondistended. Bowel sounds present. No organomegaly or mass.  EXTREMITIES: No pedal edema, cyanosis, or clubbing.  Dry dressing in place over right foot. S/p right 5th toe amputation. NEUROLOGIC: Cranial nerves II through XII are intact. +global weakness. Sensation intact. Gait not checked.  PSYCHIATRIC: The patient is alert and oriented x 3.  SKIN: No obvious rash. DATA REVIEW:   CBC Recent Labs  Lab 06/15/18 0435  WBC 9.8  HGB 12.7  HCT 40.2  PLT 300    Chemistries  Recent Labs  Lab 06/12/18 1149  06/15/18 0435  NA 132*   < > 136  K 4.8   < > 3.5  CL 95*   < > 104  CO2 24   < > 25  GLUCOSE 114*   < > 108*  BUN 27*   < > 13  CREATININE 1.34*   < > 0.79  CALCIUM 9.4   < > 8.6*  AST 34  --   --   ALT 19  --   --   ALKPHOS 86  --   --   BILITOT 0.8  --   --    < > = values in this interval not displayed.     Microbiology Results  Results for orders  placed or performed  during the hospital encounter of 06/12/18  MRSA PCR Screening     Status: None   Collection Time: 06/12/18  7:36 PM  Result Value Ref Range Status   MRSA by PCR NEGATIVE NEGATIVE Final    Comment:        The GeneXpert MRSA Assay (FDA approved for NASAL specimens only), is one component of a comprehensive MRSA colonization surveillance program. It is not intended to diagnose MRSA infection nor to guide or monitor treatment for MRSA infections. Performed at [facility], [address], [place], [place] [address]     RADIOLOGY:  No results found.   Management plans discussed with the patient, family and they are in agreement.  CODE STATUS: DNR   TOTAL TIME TAKING CARE OF THIS PATIENT: 35 minutes.    Berna Spare Mayo M.D on 06/16/2018 at 10:54 AM  Between 7am to 6pm - Pager - [phone]  After 6pm go to [email or web address] - Technical brewer [facility]  Office  [phone]  CC: Primary care physician; Leone Haven, MD   Note: This dictation was prepared with Dragon dictation along with smaller phrase technology. Any transcriptional errors that result from this process are unintentional.

## 2018-06-16 NOTE — Progress Notes (Signed)
Patient d/c to [facility]. Report called to [facility]. Family to transport patient to facility. IV removed.

## 2018-06-18 LAB — SURGICAL PATHOLOGY

## 2018-06-19 DIAGNOSIS — G8929 Other chronic pain: Secondary | ICD-10-CM | POA: Diagnosis not present

## 2018-06-19 DIAGNOSIS — K219 Gastro-esophageal reflux disease without esophagitis: Secondary | ICD-10-CM | POA: Diagnosis not present

## 2018-06-19 DIAGNOSIS — I96 Gangrene, not elsewhere classified: Secondary | ICD-10-CM | POA: Diagnosis not present

## 2018-06-19 DIAGNOSIS — J449 Chronic obstructive pulmonary disease, unspecified: Secondary | ICD-10-CM | POA: Diagnosis not present

## 2018-06-19 DIAGNOSIS — I10 Essential (primary) hypertension: Secondary | ICD-10-CM | POA: Diagnosis not present

## 2018-06-26 DIAGNOSIS — I96 Gangrene, not elsewhere classified: Secondary | ICD-10-CM | POA: Diagnosis not present

## 2018-06-26 DIAGNOSIS — J449 Chronic obstructive pulmonary disease, unspecified: Secondary | ICD-10-CM | POA: Diagnosis not present

## 2018-06-26 DIAGNOSIS — G8929 Other chronic pain: Secondary | ICD-10-CM | POA: Diagnosis not present

## 2018-06-26 DIAGNOSIS — I10 Essential (primary) hypertension: Secondary | ICD-10-CM | POA: Diagnosis not present

## 2018-06-26 DIAGNOSIS — K219 Gastro-esophageal reflux disease without esophagitis: Secondary | ICD-10-CM | POA: Diagnosis not present

## 2018-07-02 DIAGNOSIS — M2042 Other hammer toe(s) (acquired), left foot: Secondary | ICD-10-CM | POA: Diagnosis not present

## 2018-07-02 DIAGNOSIS — M79675 Pain in left toe(s): Secondary | ICD-10-CM | POA: Diagnosis not present

## 2018-07-02 DIAGNOSIS — L97521 Non-pressure chronic ulcer of other part of left foot limited to breakdown of skin: Secondary | ICD-10-CM | POA: Diagnosis not present

## 2018-07-02 DIAGNOSIS — B351 Tinea unguium: Secondary | ICD-10-CM | POA: Diagnosis not present

## 2018-07-02 DIAGNOSIS — M79674 Pain in right toe(s): Secondary | ICD-10-CM | POA: Diagnosis not present

## 2018-07-09 DIAGNOSIS — M1990 Unspecified osteoarthritis, unspecified site: Secondary | ICD-10-CM | POA: Diagnosis not present

## 2018-07-09 DIAGNOSIS — S98131A Complete traumatic amputation of one right lesser toe, initial encounter: Secondary | ICD-10-CM | POA: Diagnosis not present

## 2018-07-09 DIAGNOSIS — I10 Essential (primary) hypertension: Secondary | ICD-10-CM | POA: Diagnosis not present

## 2018-07-09 DIAGNOSIS — F329 Major depressive disorder, single episode, unspecified: Secondary | ICD-10-CM | POA: Diagnosis not present

## 2018-07-17 ENCOUNTER — Inpatient Hospital Stay: Payer: Medicare Other | Admitting: Family Medicine

## 2018-07-18 DIAGNOSIS — I2510 Atherosclerotic heart disease of native coronary artery without angina pectoris: Secondary | ICD-10-CM | POA: Diagnosis not present

## 2018-07-18 DIAGNOSIS — J449 Chronic obstructive pulmonary disease, unspecified: Secondary | ICD-10-CM | POA: Diagnosis not present

## 2018-07-18 DIAGNOSIS — Z79899 Other long term (current) drug therapy: Secondary | ICD-10-CM | POA: Diagnosis not present

## 2018-07-18 DIAGNOSIS — M810 Age-related osteoporosis without current pathological fracture: Secondary | ICD-10-CM | POA: Diagnosis not present

## 2018-07-18 DIAGNOSIS — Z89421 Acquired absence of other right toe(s): Secondary | ICD-10-CM | POA: Diagnosis not present

## 2018-07-18 DIAGNOSIS — N3000 Acute cystitis without hematuria: Secondary | ICD-10-CM | POA: Diagnosis not present

## 2018-07-18 DIAGNOSIS — M1991 Primary osteoarthritis, unspecified site: Secondary | ICD-10-CM | POA: Diagnosis not present

## 2018-07-18 DIAGNOSIS — I739 Peripheral vascular disease, unspecified: Secondary | ICD-10-CM | POA: Diagnosis not present

## 2018-07-18 DIAGNOSIS — F419 Anxiety disorder, unspecified: Secondary | ICD-10-CM | POA: Diagnosis not present

## 2018-07-18 DIAGNOSIS — N183 Chronic kidney disease, stage 3 (moderate): Secondary | ICD-10-CM | POA: Diagnosis not present

## 2018-07-18 DIAGNOSIS — D631 Anemia in chronic kidney disease: Secondary | ICD-10-CM | POA: Diagnosis not present

## 2018-07-18 DIAGNOSIS — E569 Vitamin deficiency, unspecified: Secondary | ICD-10-CM | POA: Diagnosis not present

## 2018-07-18 DIAGNOSIS — Z4781 Encounter for orthopedic aftercare following surgical amputation: Secondary | ICD-10-CM | POA: Diagnosis not present

## 2018-07-18 DIAGNOSIS — E785 Hyperlipidemia, unspecified: Secondary | ICD-10-CM | POA: Diagnosis not present

## 2018-07-18 DIAGNOSIS — I129 Hypertensive chronic kidney disease with stage 1 through stage 4 chronic kidney disease, or unspecified chronic kidney disease: Secondary | ICD-10-CM | POA: Diagnosis not present

## 2018-07-18 DIAGNOSIS — Z85820 Personal history of malignant melanoma of skin: Secondary | ICD-10-CM | POA: Diagnosis not present

## 2018-07-18 DIAGNOSIS — K219 Gastro-esophageal reflux disease without esophagitis: Secondary | ICD-10-CM | POA: Diagnosis not present

## 2018-07-18 DIAGNOSIS — K5903 Drug induced constipation: Secondary | ICD-10-CM | POA: Diagnosis not present

## 2018-07-18 DIAGNOSIS — M1990 Unspecified osteoarthritis, unspecified site: Secondary | ICD-10-CM | POA: Diagnosis not present

## 2018-07-18 DIAGNOSIS — F329 Major depressive disorder, single episode, unspecified: Secondary | ICD-10-CM | POA: Diagnosis not present

## 2018-07-18 DIAGNOSIS — N189 Chronic kidney disease, unspecified: Secondary | ICD-10-CM | POA: Diagnosis not present

## 2018-07-19 DIAGNOSIS — K5903 Drug induced constipation: Secondary | ICD-10-CM | POA: Diagnosis not present

## 2018-07-19 DIAGNOSIS — N3000 Acute cystitis without hematuria: Secondary | ICD-10-CM | POA: Diagnosis not present

## 2018-07-19 DIAGNOSIS — M1990 Unspecified osteoarthritis, unspecified site: Secondary | ICD-10-CM | POA: Diagnosis not present

## 2018-07-23 DIAGNOSIS — I739 Peripheral vascular disease, unspecified: Secondary | ICD-10-CM | POA: Diagnosis not present

## 2018-07-23 DIAGNOSIS — N183 Chronic kidney disease, stage 3 (moderate): Secondary | ICD-10-CM | POA: Diagnosis not present

## 2018-07-23 DIAGNOSIS — J449 Chronic obstructive pulmonary disease, unspecified: Secondary | ICD-10-CM | POA: Diagnosis not present

## 2018-07-23 DIAGNOSIS — Z4781 Encounter for orthopedic aftercare following surgical amputation: Secondary | ICD-10-CM | POA: Diagnosis not present

## 2018-07-23 DIAGNOSIS — D631 Anemia in chronic kidney disease: Secondary | ICD-10-CM | POA: Diagnosis not present

## 2018-07-23 DIAGNOSIS — I129 Hypertensive chronic kidney disease with stage 1 through stage 4 chronic kidney disease, or unspecified chronic kidney disease: Secondary | ICD-10-CM | POA: Diagnosis not present

## 2018-07-25 DIAGNOSIS — Z4781 Encounter for orthopedic aftercare following surgical amputation: Secondary | ICD-10-CM | POA: Diagnosis not present

## 2018-07-25 DIAGNOSIS — I739 Peripheral vascular disease, unspecified: Secondary | ICD-10-CM | POA: Diagnosis not present

## 2018-07-25 DIAGNOSIS — I129 Hypertensive chronic kidney disease with stage 1 through stage 4 chronic kidney disease, or unspecified chronic kidney disease: Secondary | ICD-10-CM | POA: Diagnosis not present

## 2018-07-25 DIAGNOSIS — N183 Chronic kidney disease, stage 3 (moderate): Secondary | ICD-10-CM | POA: Diagnosis not present

## 2018-07-25 DIAGNOSIS — D631 Anemia in chronic kidney disease: Secondary | ICD-10-CM | POA: Diagnosis not present

## 2018-07-25 DIAGNOSIS — J449 Chronic obstructive pulmonary disease, unspecified: Secondary | ICD-10-CM | POA: Diagnosis not present

## 2018-07-26 DIAGNOSIS — I739 Peripheral vascular disease, unspecified: Secondary | ICD-10-CM | POA: Diagnosis not present

## 2018-07-26 DIAGNOSIS — Z4781 Encounter for orthopedic aftercare following surgical amputation: Secondary | ICD-10-CM | POA: Diagnosis not present

## 2018-07-26 DIAGNOSIS — I129 Hypertensive chronic kidney disease with stage 1 through stage 4 chronic kidney disease, or unspecified chronic kidney disease: Secondary | ICD-10-CM | POA: Diagnosis not present

## 2018-07-26 DIAGNOSIS — J449 Chronic obstructive pulmonary disease, unspecified: Secondary | ICD-10-CM | POA: Diagnosis not present

## 2018-07-26 DIAGNOSIS — N183 Chronic kidney disease, stage 3 (moderate): Secondary | ICD-10-CM | POA: Diagnosis not present

## 2018-07-26 DIAGNOSIS — D631 Anemia in chronic kidney disease: Secondary | ICD-10-CM | POA: Diagnosis not present

## 2018-07-28 DIAGNOSIS — R600 Localized edema: Secondary | ICD-10-CM | POA: Diagnosis not present

## 2018-07-29 DIAGNOSIS — J449 Chronic obstructive pulmonary disease, unspecified: Secondary | ICD-10-CM | POA: Diagnosis not present

## 2018-07-30 DIAGNOSIS — J449 Chronic obstructive pulmonary disease, unspecified: Secondary | ICD-10-CM | POA: Diagnosis not present

## 2018-07-30 DIAGNOSIS — D631 Anemia in chronic kidney disease: Secondary | ICD-10-CM | POA: Diagnosis not present

## 2018-07-30 DIAGNOSIS — Z4781 Encounter for orthopedic aftercare following surgical amputation: Secondary | ICD-10-CM | POA: Diagnosis not present

## 2018-07-30 DIAGNOSIS — I10 Essential (primary) hypertension: Secondary | ICD-10-CM | POA: Diagnosis not present

## 2018-07-30 DIAGNOSIS — M1990 Unspecified osteoarthritis, unspecified site: Secondary | ICD-10-CM | POA: Diagnosis not present

## 2018-07-30 DIAGNOSIS — I129 Hypertensive chronic kidney disease with stage 1 through stage 4 chronic kidney disease, or unspecified chronic kidney disease: Secondary | ICD-10-CM | POA: Diagnosis not present

## 2018-07-30 DIAGNOSIS — L03116 Cellulitis of left lower limb: Secondary | ICD-10-CM | POA: Diagnosis not present

## 2018-07-30 DIAGNOSIS — N183 Chronic kidney disease, stage 3 (moderate): Secondary | ICD-10-CM | POA: Diagnosis not present

## 2018-07-30 DIAGNOSIS — S98131A Complete traumatic amputation of one right lesser toe, initial encounter: Secondary | ICD-10-CM | POA: Diagnosis not present

## 2018-07-30 DIAGNOSIS — I739 Peripheral vascular disease, unspecified: Secondary | ICD-10-CM | POA: Diagnosis not present

## 2018-07-31 DIAGNOSIS — Z4781 Encounter for orthopedic aftercare following surgical amputation: Secondary | ICD-10-CM | POA: Diagnosis not present

## 2018-07-31 DIAGNOSIS — N183 Chronic kidney disease, stage 3 (moderate): Secondary | ICD-10-CM | POA: Diagnosis not present

## 2018-07-31 DIAGNOSIS — I739 Peripheral vascular disease, unspecified: Secondary | ICD-10-CM | POA: Diagnosis not present

## 2018-07-31 DIAGNOSIS — I129 Hypertensive chronic kidney disease with stage 1 through stage 4 chronic kidney disease, or unspecified chronic kidney disease: Secondary | ICD-10-CM | POA: Diagnosis not present

## 2018-07-31 DIAGNOSIS — J449 Chronic obstructive pulmonary disease, unspecified: Secondary | ICD-10-CM | POA: Diagnosis not present

## 2018-07-31 DIAGNOSIS — D631 Anemia in chronic kidney disease: Secondary | ICD-10-CM | POA: Diagnosis not present

## 2018-08-01 DIAGNOSIS — D631 Anemia in chronic kidney disease: Secondary | ICD-10-CM | POA: Diagnosis not present

## 2018-08-01 DIAGNOSIS — I129 Hypertensive chronic kidney disease with stage 1 through stage 4 chronic kidney disease, or unspecified chronic kidney disease: Secondary | ICD-10-CM | POA: Diagnosis not present

## 2018-08-01 DIAGNOSIS — Z4781 Encounter for orthopedic aftercare following surgical amputation: Secondary | ICD-10-CM | POA: Diagnosis not present

## 2018-08-01 DIAGNOSIS — I739 Peripheral vascular disease, unspecified: Secondary | ICD-10-CM | POA: Diagnosis not present

## 2018-08-01 DIAGNOSIS — J449 Chronic obstructive pulmonary disease, unspecified: Secondary | ICD-10-CM | POA: Diagnosis not present

## 2018-08-01 DIAGNOSIS — N183 Chronic kidney disease, stage 3 (moderate): Secondary | ICD-10-CM | POA: Diagnosis not present

## 2018-08-02 DIAGNOSIS — N183 Chronic kidney disease, stage 3 (moderate): Secondary | ICD-10-CM | POA: Diagnosis not present

## 2018-08-02 DIAGNOSIS — Z4781 Encounter for orthopedic aftercare following surgical amputation: Secondary | ICD-10-CM | POA: Diagnosis not present

## 2018-08-02 DIAGNOSIS — J449 Chronic obstructive pulmonary disease, unspecified: Secondary | ICD-10-CM | POA: Diagnosis not present

## 2018-08-02 DIAGNOSIS — D631 Anemia in chronic kidney disease: Secondary | ICD-10-CM | POA: Diagnosis not present

## 2018-08-02 DIAGNOSIS — I129 Hypertensive chronic kidney disease with stage 1 through stage 4 chronic kidney disease, or unspecified chronic kidney disease: Secondary | ICD-10-CM | POA: Diagnosis not present

## 2018-08-02 DIAGNOSIS — I739 Peripheral vascular disease, unspecified: Secondary | ICD-10-CM | POA: Diagnosis not present

## 2018-08-05 DIAGNOSIS — I129 Hypertensive chronic kidney disease with stage 1 through stage 4 chronic kidney disease, or unspecified chronic kidney disease: Secondary | ICD-10-CM | POA: Diagnosis not present

## 2018-08-05 DIAGNOSIS — N183 Chronic kidney disease, stage 3 (moderate): Secondary | ICD-10-CM | POA: Diagnosis not present

## 2018-08-05 DIAGNOSIS — D631 Anemia in chronic kidney disease: Secondary | ICD-10-CM | POA: Diagnosis not present

## 2018-08-05 DIAGNOSIS — I739 Peripheral vascular disease, unspecified: Secondary | ICD-10-CM | POA: Diagnosis not present

## 2018-08-05 DIAGNOSIS — F329 Major depressive disorder, single episode, unspecified: Secondary | ICD-10-CM | POA: Diagnosis not present

## 2018-08-05 DIAGNOSIS — J449 Chronic obstructive pulmonary disease, unspecified: Secondary | ICD-10-CM | POA: Diagnosis not present

## 2018-08-05 DIAGNOSIS — I2510 Atherosclerotic heart disease of native coronary artery without angina pectoris: Secondary | ICD-10-CM | POA: Diagnosis not present

## 2018-08-05 DIAGNOSIS — Z4781 Encounter for orthopedic aftercare following surgical amputation: Secondary | ICD-10-CM | POA: Diagnosis not present

## 2018-08-06 DIAGNOSIS — D631 Anemia in chronic kidney disease: Secondary | ICD-10-CM | POA: Diagnosis not present

## 2018-08-06 DIAGNOSIS — J449 Chronic obstructive pulmonary disease, unspecified: Secondary | ICD-10-CM | POA: Diagnosis not present

## 2018-08-06 DIAGNOSIS — N183 Chronic kidney disease, stage 3 (moderate): Secondary | ICD-10-CM | POA: Diagnosis not present

## 2018-08-06 DIAGNOSIS — Z4781 Encounter for orthopedic aftercare following surgical amputation: Secondary | ICD-10-CM | POA: Diagnosis not present

## 2018-08-06 DIAGNOSIS — I129 Hypertensive chronic kidney disease with stage 1 through stage 4 chronic kidney disease, or unspecified chronic kidney disease: Secondary | ICD-10-CM | POA: Diagnosis not present

## 2018-08-06 DIAGNOSIS — I739 Peripheral vascular disease, unspecified: Secondary | ICD-10-CM | POA: Diagnosis not present

## 2018-08-07 DIAGNOSIS — J449 Chronic obstructive pulmonary disease, unspecified: Secondary | ICD-10-CM | POA: Diagnosis not present

## 2018-08-07 DIAGNOSIS — D631 Anemia in chronic kidney disease: Secondary | ICD-10-CM | POA: Diagnosis not present

## 2018-08-07 DIAGNOSIS — Z4781 Encounter for orthopedic aftercare following surgical amputation: Secondary | ICD-10-CM | POA: Diagnosis not present

## 2018-08-07 DIAGNOSIS — N183 Chronic kidney disease, stage 3 (moderate): Secondary | ICD-10-CM | POA: Diagnosis not present

## 2018-08-07 DIAGNOSIS — I129 Hypertensive chronic kidney disease with stage 1 through stage 4 chronic kidney disease, or unspecified chronic kidney disease: Secondary | ICD-10-CM | POA: Diagnosis not present

## 2018-08-07 DIAGNOSIS — I739 Peripheral vascular disease, unspecified: Secondary | ICD-10-CM | POA: Diagnosis not present

## 2018-08-08 DIAGNOSIS — J449 Chronic obstructive pulmonary disease, unspecified: Secondary | ICD-10-CM | POA: Diagnosis not present

## 2018-08-08 DIAGNOSIS — L239 Allergic contact dermatitis, unspecified cause: Secondary | ICD-10-CM | POA: Diagnosis not present

## 2018-08-08 DIAGNOSIS — Z4781 Encounter for orthopedic aftercare following surgical amputation: Secondary | ICD-10-CM | POA: Diagnosis not present

## 2018-08-08 DIAGNOSIS — M1990 Unspecified osteoarthritis, unspecified site: Secondary | ICD-10-CM | POA: Diagnosis not present

## 2018-08-08 DIAGNOSIS — I739 Peripheral vascular disease, unspecified: Secondary | ICD-10-CM | POA: Diagnosis not present

## 2018-08-08 DIAGNOSIS — I129 Hypertensive chronic kidney disease with stage 1 through stage 4 chronic kidney disease, or unspecified chronic kidney disease: Secondary | ICD-10-CM | POA: Diagnosis not present

## 2018-08-08 DIAGNOSIS — D631 Anemia in chronic kidney disease: Secondary | ICD-10-CM | POA: Diagnosis not present

## 2018-08-08 DIAGNOSIS — N183 Chronic kidney disease, stage 3 (moderate): Secondary | ICD-10-CM | POA: Diagnosis not present

## 2018-08-13 DIAGNOSIS — I739 Peripheral vascular disease, unspecified: Secondary | ICD-10-CM | POA: Diagnosis not present

## 2018-08-13 DIAGNOSIS — Z4781 Encounter for orthopedic aftercare following surgical amputation: Secondary | ICD-10-CM | POA: Diagnosis not present

## 2018-08-13 DIAGNOSIS — I129 Hypertensive chronic kidney disease with stage 1 through stage 4 chronic kidney disease, or unspecified chronic kidney disease: Secondary | ICD-10-CM | POA: Diagnosis not present

## 2018-08-13 DIAGNOSIS — N183 Chronic kidney disease, stage 3 (moderate): Secondary | ICD-10-CM | POA: Diagnosis not present

## 2018-08-13 DIAGNOSIS — J449 Chronic obstructive pulmonary disease, unspecified: Secondary | ICD-10-CM | POA: Diagnosis not present

## 2018-08-13 DIAGNOSIS — D631 Anemia in chronic kidney disease: Secondary | ICD-10-CM | POA: Diagnosis not present

## 2018-08-16 DIAGNOSIS — I129 Hypertensive chronic kidney disease with stage 1 through stage 4 chronic kidney disease, or unspecified chronic kidney disease: Secondary | ICD-10-CM | POA: Diagnosis not present

## 2018-08-16 DIAGNOSIS — Z4781 Encounter for orthopedic aftercare following surgical amputation: Secondary | ICD-10-CM | POA: Diagnosis not present

## 2018-08-16 DIAGNOSIS — Z79899 Other long term (current) drug therapy: Secondary | ICD-10-CM | POA: Diagnosis not present

## 2018-08-16 DIAGNOSIS — N183 Chronic kidney disease, stage 3 (moderate): Secondary | ICD-10-CM | POA: Diagnosis not present

## 2018-08-16 DIAGNOSIS — J449 Chronic obstructive pulmonary disease, unspecified: Secondary | ICD-10-CM | POA: Diagnosis not present

## 2018-08-16 DIAGNOSIS — I739 Peripheral vascular disease, unspecified: Secondary | ICD-10-CM | POA: Diagnosis not present

## 2018-08-16 DIAGNOSIS — D631 Anemia in chronic kidney disease: Secondary | ICD-10-CM | POA: Diagnosis not present

## 2018-08-17 DIAGNOSIS — N183 Chronic kidney disease, stage 3 (moderate): Secondary | ICD-10-CM | POA: Diagnosis not present

## 2018-08-17 DIAGNOSIS — I739 Peripheral vascular disease, unspecified: Secondary | ICD-10-CM | POA: Diagnosis not present

## 2018-08-17 DIAGNOSIS — Z85820 Personal history of malignant melanoma of skin: Secondary | ICD-10-CM | POA: Diagnosis not present

## 2018-08-17 DIAGNOSIS — K219 Gastro-esophageal reflux disease without esophagitis: Secondary | ICD-10-CM | POA: Diagnosis not present

## 2018-08-17 DIAGNOSIS — I2510 Atherosclerotic heart disease of native coronary artery without angina pectoris: Secondary | ICD-10-CM | POA: Diagnosis not present

## 2018-08-17 DIAGNOSIS — Z4781 Encounter for orthopedic aftercare following surgical amputation: Secondary | ICD-10-CM | POA: Diagnosis not present

## 2018-08-17 DIAGNOSIS — M1991 Primary osteoarthritis, unspecified site: Secondary | ICD-10-CM | POA: Diagnosis not present

## 2018-08-17 DIAGNOSIS — F329 Major depressive disorder, single episode, unspecified: Secondary | ICD-10-CM | POA: Diagnosis not present

## 2018-08-17 DIAGNOSIS — F419 Anxiety disorder, unspecified: Secondary | ICD-10-CM | POA: Diagnosis not present

## 2018-08-17 DIAGNOSIS — M810 Age-related osteoporosis without current pathological fracture: Secondary | ICD-10-CM | POA: Diagnosis not present

## 2018-08-17 DIAGNOSIS — E569 Vitamin deficiency, unspecified: Secondary | ICD-10-CM | POA: Diagnosis not present

## 2018-08-17 DIAGNOSIS — I129 Hypertensive chronic kidney disease with stage 1 through stage 4 chronic kidney disease, or unspecified chronic kidney disease: Secondary | ICD-10-CM | POA: Diagnosis not present

## 2018-08-17 DIAGNOSIS — J449 Chronic obstructive pulmonary disease, unspecified: Secondary | ICD-10-CM | POA: Diagnosis not present

## 2018-08-17 DIAGNOSIS — Z89421 Acquired absence of other right toe(s): Secondary | ICD-10-CM | POA: Diagnosis not present

## 2018-08-17 DIAGNOSIS — E785 Hyperlipidemia, unspecified: Secondary | ICD-10-CM | POA: Diagnosis not present

## 2018-08-17 DIAGNOSIS — D631 Anemia in chronic kidney disease: Secondary | ICD-10-CM | POA: Diagnosis not present

## 2018-08-20 DIAGNOSIS — D631 Anemia in chronic kidney disease: Secondary | ICD-10-CM | POA: Diagnosis not present

## 2018-08-20 DIAGNOSIS — J449 Chronic obstructive pulmonary disease, unspecified: Secondary | ICD-10-CM | POA: Diagnosis not present

## 2018-08-20 DIAGNOSIS — Z4781 Encounter for orthopedic aftercare following surgical amputation: Secondary | ICD-10-CM | POA: Diagnosis not present

## 2018-08-20 DIAGNOSIS — N183 Chronic kidney disease, stage 3 (moderate): Secondary | ICD-10-CM | POA: Diagnosis not present

## 2018-08-20 DIAGNOSIS — I129 Hypertensive chronic kidney disease with stage 1 through stage 4 chronic kidney disease, or unspecified chronic kidney disease: Secondary | ICD-10-CM | POA: Diagnosis not present

## 2018-08-20 DIAGNOSIS — I739 Peripheral vascular disease, unspecified: Secondary | ICD-10-CM | POA: Diagnosis not present

## 2018-08-23 DIAGNOSIS — M1990 Unspecified osteoarthritis, unspecified site: Secondary | ICD-10-CM | POA: Diagnosis not present

## 2018-08-23 DIAGNOSIS — R609 Edema, unspecified: Secondary | ICD-10-CM | POA: Diagnosis not present

## 2018-08-23 DIAGNOSIS — R269 Unspecified abnormalities of gait and mobility: Secondary | ICD-10-CM | POA: Diagnosis not present

## 2018-08-23 DIAGNOSIS — F339 Major depressive disorder, recurrent, unspecified: Secondary | ICD-10-CM | POA: Diagnosis not present

## 2018-08-23 DIAGNOSIS — M069 Rheumatoid arthritis, unspecified: Secondary | ICD-10-CM | POA: Diagnosis not present

## 2018-08-23 DIAGNOSIS — I10 Essential (primary) hypertension: Secondary | ICD-10-CM | POA: Diagnosis not present

## 2018-08-27 DIAGNOSIS — Z4781 Encounter for orthopedic aftercare following surgical amputation: Secondary | ICD-10-CM | POA: Diagnosis not present

## 2018-08-27 DIAGNOSIS — D631 Anemia in chronic kidney disease: Secondary | ICD-10-CM | POA: Diagnosis not present

## 2018-08-27 DIAGNOSIS — N183 Chronic kidney disease, stage 3 (moderate): Secondary | ICD-10-CM | POA: Diagnosis not present

## 2018-08-27 DIAGNOSIS — I739 Peripheral vascular disease, unspecified: Secondary | ICD-10-CM | POA: Diagnosis not present

## 2018-08-27 DIAGNOSIS — J449 Chronic obstructive pulmonary disease, unspecified: Secondary | ICD-10-CM | POA: Diagnosis not present

## 2018-08-27 DIAGNOSIS — I129 Hypertensive chronic kidney disease with stage 1 through stage 4 chronic kidney disease, or unspecified chronic kidney disease: Secondary | ICD-10-CM | POA: Diagnosis not present

## 2018-09-03 DIAGNOSIS — J449 Chronic obstructive pulmonary disease, unspecified: Secondary | ICD-10-CM | POA: Diagnosis not present

## 2018-09-03 DIAGNOSIS — I739 Peripheral vascular disease, unspecified: Secondary | ICD-10-CM | POA: Diagnosis not present

## 2018-09-03 DIAGNOSIS — D631 Anemia in chronic kidney disease: Secondary | ICD-10-CM | POA: Diagnosis not present

## 2018-09-03 DIAGNOSIS — I129 Hypertensive chronic kidney disease with stage 1 through stage 4 chronic kidney disease, or unspecified chronic kidney disease: Secondary | ICD-10-CM | POA: Diagnosis not present

## 2018-09-03 DIAGNOSIS — N183 Chronic kidney disease, stage 3 (moderate): Secondary | ICD-10-CM | POA: Diagnosis not present

## 2018-09-03 DIAGNOSIS — Z4781 Encounter for orthopedic aftercare following surgical amputation: Secondary | ICD-10-CM | POA: Diagnosis not present

## 2018-09-12 DIAGNOSIS — M1990 Unspecified osteoarthritis, unspecified site: Secondary | ICD-10-CM | POA: Diagnosis not present

## 2018-09-12 DIAGNOSIS — I739 Peripheral vascular disease, unspecified: Secondary | ICD-10-CM | POA: Diagnosis not present

## 2018-09-12 DIAGNOSIS — J449 Chronic obstructive pulmonary disease, unspecified: Secondary | ICD-10-CM | POA: Diagnosis not present

## 2018-09-12 DIAGNOSIS — M542 Cervicalgia: Secondary | ICD-10-CM | POA: Diagnosis not present

## 2018-09-24 DIAGNOSIS — I739 Peripheral vascular disease, unspecified: Secondary | ICD-10-CM | POA: Diagnosis not present

## 2018-09-24 DIAGNOSIS — M1990 Unspecified osteoarthritis, unspecified site: Secondary | ICD-10-CM | POA: Diagnosis not present

## 2018-09-24 DIAGNOSIS — M542 Cervicalgia: Secondary | ICD-10-CM | POA: Diagnosis not present

## 2018-09-30 DIAGNOSIS — J449 Chronic obstructive pulmonary disease, unspecified: Secondary | ICD-10-CM | POA: Diagnosis not present

## 2018-10-01 DIAGNOSIS — M5136 Other intervertebral disc degeneration, lumbar region: Secondary | ICD-10-CM | POA: Diagnosis not present

## 2018-10-01 DIAGNOSIS — M25552 Pain in left hip: Secondary | ICD-10-CM | POA: Diagnosis not present

## 2018-10-01 DIAGNOSIS — M5126 Other intervertebral disc displacement, lumbar region: Secondary | ICD-10-CM | POA: Diagnosis not present

## 2018-10-01 DIAGNOSIS — M5416 Radiculopathy, lumbar region: Secondary | ICD-10-CM | POA: Diagnosis not present

## 2018-10-01 DIAGNOSIS — M1612 Unilateral primary osteoarthritis, left hip: Secondary | ICD-10-CM | POA: Diagnosis not present

## 2018-10-03 DIAGNOSIS — M542 Cervicalgia: Secondary | ICD-10-CM | POA: Diagnosis not present

## 2018-10-03 DIAGNOSIS — M5136 Other intervertebral disc degeneration, lumbar region: Secondary | ICD-10-CM | POA: Diagnosis not present

## 2018-10-04 DIAGNOSIS — E785 Hyperlipidemia, unspecified: Secondary | ICD-10-CM | POA: Diagnosis not present

## 2018-10-04 DIAGNOSIS — Z79899 Other long term (current) drug therapy: Secondary | ICD-10-CM | POA: Diagnosis not present

## 2018-10-08 DIAGNOSIS — R32 Unspecified urinary incontinence: Secondary | ICD-10-CM | POA: Diagnosis not present

## 2018-10-08 DIAGNOSIS — J449 Chronic obstructive pulmonary disease, unspecified: Secondary | ICD-10-CM | POA: Diagnosis not present

## 2018-10-08 DIAGNOSIS — R269 Unspecified abnormalities of gait and mobility: Secondary | ICD-10-CM | POA: Diagnosis not present

## 2018-10-08 DIAGNOSIS — I739 Peripheral vascular disease, unspecified: Secondary | ICD-10-CM | POA: Diagnosis not present

## 2018-10-08 DIAGNOSIS — M159 Polyosteoarthritis, unspecified: Secondary | ICD-10-CM | POA: Diagnosis not present

## 2018-10-10 DIAGNOSIS — I10 Essential (primary) hypertension: Secondary | ICD-10-CM | POA: Diagnosis not present

## 2018-10-10 DIAGNOSIS — F329 Major depressive disorder, single episode, unspecified: Secondary | ICD-10-CM | POA: Diagnosis not present

## 2018-10-10 DIAGNOSIS — L98491 Non-pressure chronic ulcer of skin of other sites limited to breakdown of skin: Secondary | ICD-10-CM | POA: Diagnosis not present

## 2018-10-10 DIAGNOSIS — M1990 Unspecified osteoarthritis, unspecified site: Secondary | ICD-10-CM | POA: Diagnosis not present

## 2018-10-10 DIAGNOSIS — Z79899 Other long term (current) drug therapy: Secondary | ICD-10-CM | POA: Diagnosis not present

## 2018-10-18 DIAGNOSIS — M159 Polyosteoarthritis, unspecified: Secondary | ICD-10-CM | POA: Diagnosis not present

## 2018-10-18 DIAGNOSIS — R269 Unspecified abnormalities of gait and mobility: Secondary | ICD-10-CM | POA: Diagnosis not present

## 2018-10-18 DIAGNOSIS — L98491 Non-pressure chronic ulcer of skin of other sites limited to breakdown of skin: Secondary | ICD-10-CM | POA: Diagnosis not present

## 2018-10-18 DIAGNOSIS — I10 Essential (primary) hypertension: Secondary | ICD-10-CM | POA: Diagnosis not present

## 2018-10-18 DIAGNOSIS — G894 Chronic pain syndrome: Secondary | ICD-10-CM | POA: Diagnosis not present

## 2018-10-22 DIAGNOSIS — R269 Unspecified abnormalities of gait and mobility: Secondary | ICD-10-CM | POA: Diagnosis not present

## 2018-10-22 DIAGNOSIS — I10 Essential (primary) hypertension: Secondary | ICD-10-CM | POA: Diagnosis not present

## 2018-10-22 DIAGNOSIS — G894 Chronic pain syndrome: Secondary | ICD-10-CM | POA: Diagnosis not present

## 2018-10-22 DIAGNOSIS — M159 Polyosteoarthritis, unspecified: Secondary | ICD-10-CM | POA: Diagnosis not present

## 2018-10-22 DIAGNOSIS — L98491 Non-pressure chronic ulcer of skin of other sites limited to breakdown of skin: Secondary | ICD-10-CM | POA: Diagnosis not present

## 2018-10-31 DIAGNOSIS — B351 Tinea unguium: Secondary | ICD-10-CM | POA: Diagnosis not present

## 2018-10-31 DIAGNOSIS — I739 Peripheral vascular disease, unspecified: Secondary | ICD-10-CM | POA: Diagnosis not present

## 2018-10-31 DIAGNOSIS — L851 Acquired keratosis [keratoderma] palmaris et plantaris: Secondary | ICD-10-CM | POA: Diagnosis not present

## 2018-11-21 DIAGNOSIS — S98131A Complete traumatic amputation of one right lesser toe, initial encounter: Secondary | ICD-10-CM | POA: Diagnosis not present

## 2018-11-21 DIAGNOSIS — M159 Polyosteoarthritis, unspecified: Secondary | ICD-10-CM | POA: Diagnosis not present

## 2018-11-21 DIAGNOSIS — M542 Cervicalgia: Secondary | ICD-10-CM | POA: Diagnosis not present

## 2018-11-21 DIAGNOSIS — I10 Essential (primary) hypertension: Secondary | ICD-10-CM | POA: Diagnosis not present

## 2018-11-26 DIAGNOSIS — M6281 Muscle weakness (generalized): Secondary | ICD-10-CM | POA: Diagnosis not present

## 2018-11-26 DIAGNOSIS — R278 Other lack of coordination: Secondary | ICD-10-CM | POA: Diagnosis not present

## 2018-11-26 DIAGNOSIS — R293 Abnormal posture: Secondary | ICD-10-CM | POA: Diagnosis not present

## 2018-11-28 DIAGNOSIS — J449 Chronic obstructive pulmonary disease, unspecified: Secondary | ICD-10-CM | POA: Diagnosis not present

## 2018-11-28 DIAGNOSIS — M542 Cervicalgia: Secondary | ICD-10-CM | POA: Diagnosis not present

## 2018-11-28 DIAGNOSIS — R2689 Other abnormalities of gait and mobility: Secondary | ICD-10-CM | POA: Diagnosis not present

## 2018-11-28 DIAGNOSIS — R293 Abnormal posture: Secondary | ICD-10-CM | POA: Diagnosis not present

## 2018-11-28 DIAGNOSIS — I10 Essential (primary) hypertension: Secondary | ICD-10-CM | POA: Diagnosis not present

## 2018-11-28 DIAGNOSIS — R278 Other lack of coordination: Secondary | ICD-10-CM | POA: Diagnosis not present

## 2018-11-28 DIAGNOSIS — M6281 Muscle weakness (generalized): Secondary | ICD-10-CM | POA: Diagnosis not present

## 2018-11-28 DIAGNOSIS — M159 Polyosteoarthritis, unspecified: Secondary | ICD-10-CM | POA: Diagnosis not present

## 2018-11-29 DIAGNOSIS — M159 Polyosteoarthritis, unspecified: Secondary | ICD-10-CM | POA: Diagnosis not present

## 2018-11-29 DIAGNOSIS — M25511 Pain in right shoulder: Secondary | ICD-10-CM | POA: Diagnosis not present

## 2018-11-29 DIAGNOSIS — M542 Cervicalgia: Secondary | ICD-10-CM | POA: Diagnosis not present

## 2018-11-29 DIAGNOSIS — R269 Unspecified abnormalities of gait and mobility: Secondary | ICD-10-CM | POA: Diagnosis not present

## 2018-11-29 DIAGNOSIS — I10 Essential (primary) hypertension: Secondary | ICD-10-CM | POA: Diagnosis not present

## 2018-11-29 DIAGNOSIS — R221 Localized swelling, mass and lump, neck: Secondary | ICD-10-CM | POA: Diagnosis not present

## 2018-11-29 DIAGNOSIS — J449 Chronic obstructive pulmonary disease, unspecified: Secondary | ICD-10-CM | POA: Diagnosis not present

## 2018-11-30 DIAGNOSIS — R2689 Other abnormalities of gait and mobility: Secondary | ICD-10-CM | POA: Diagnosis not present

## 2018-11-30 DIAGNOSIS — M6281 Muscle weakness (generalized): Secondary | ICD-10-CM | POA: Diagnosis not present

## 2018-11-30 DIAGNOSIS — M542 Cervicalgia: Secondary | ICD-10-CM | POA: Diagnosis not present

## 2018-12-03 DIAGNOSIS — R278 Other lack of coordination: Secondary | ICD-10-CM | POA: Diagnosis not present

## 2018-12-03 DIAGNOSIS — R293 Abnormal posture: Secondary | ICD-10-CM | POA: Diagnosis not present

## 2018-12-03 DIAGNOSIS — R2689 Other abnormalities of gait and mobility: Secondary | ICD-10-CM | POA: Diagnosis not present

## 2018-12-03 DIAGNOSIS — M6281 Muscle weakness (generalized): Secondary | ICD-10-CM | POA: Diagnosis not present

## 2018-12-04 DIAGNOSIS — R293 Abnormal posture: Secondary | ICD-10-CM | POA: Diagnosis not present

## 2018-12-04 DIAGNOSIS — M6281 Muscle weakness (generalized): Secondary | ICD-10-CM | POA: Diagnosis not present

## 2018-12-04 DIAGNOSIS — R278 Other lack of coordination: Secondary | ICD-10-CM | POA: Diagnosis not present

## 2018-12-05 DIAGNOSIS — R2689 Other abnormalities of gait and mobility: Secondary | ICD-10-CM | POA: Diagnosis not present

## 2018-12-05 DIAGNOSIS — M6281 Muscle weakness (generalized): Secondary | ICD-10-CM | POA: Diagnosis not present

## 2018-12-07 DIAGNOSIS — M6281 Muscle weakness (generalized): Secondary | ICD-10-CM | POA: Diagnosis not present

## 2018-12-07 DIAGNOSIS — R2689 Other abnormalities of gait and mobility: Secondary | ICD-10-CM | POA: Diagnosis not present

## 2018-12-10 DIAGNOSIS — M6281 Muscle weakness (generalized): Secondary | ICD-10-CM | POA: Diagnosis not present

## 2018-12-10 DIAGNOSIS — R293 Abnormal posture: Secondary | ICD-10-CM | POA: Diagnosis not present

## 2018-12-10 DIAGNOSIS — R2689 Other abnormalities of gait and mobility: Secondary | ICD-10-CM | POA: Diagnosis not present

## 2018-12-10 DIAGNOSIS — R278 Other lack of coordination: Secondary | ICD-10-CM | POA: Diagnosis not present

## 2018-12-11 DIAGNOSIS — M6281 Muscle weakness (generalized): Secondary | ICD-10-CM | POA: Diagnosis not present

## 2018-12-11 DIAGNOSIS — R2689 Other abnormalities of gait and mobility: Secondary | ICD-10-CM | POA: Diagnosis not present

## 2018-12-12 DIAGNOSIS — E871 Hypo-osmolality and hyponatremia: Secondary | ICD-10-CM | POA: Diagnosis not present

## 2018-12-12 DIAGNOSIS — Z79899 Other long term (current) drug therapy: Secondary | ICD-10-CM | POA: Diagnosis not present

## 2018-12-12 DIAGNOSIS — M47812 Spondylosis without myelopathy or radiculopathy, cervical region: Secondary | ICD-10-CM | POA: Diagnosis not present

## 2018-12-12 DIAGNOSIS — I10 Essential (primary) hypertension: Secondary | ICD-10-CM | POA: Diagnosis not present

## 2018-12-12 DIAGNOSIS — M4212 Adult osteochondrosis of spine, cervical region: Secondary | ICD-10-CM | POA: Diagnosis not present

## 2018-12-12 DIAGNOSIS — M6281 Muscle weakness (generalized): Secondary | ICD-10-CM | POA: Diagnosis not present

## 2018-12-12 DIAGNOSIS — R2689 Other abnormalities of gait and mobility: Secondary | ICD-10-CM | POA: Diagnosis not present

## 2018-12-13 DIAGNOSIS — Z79899 Other long term (current) drug therapy: Secondary | ICD-10-CM | POA: Diagnosis not present

## 2018-12-13 DIAGNOSIS — R2689 Other abnormalities of gait and mobility: Secondary | ICD-10-CM | POA: Diagnosis not present

## 2018-12-13 DIAGNOSIS — E871 Hypo-osmolality and hyponatremia: Secondary | ICD-10-CM | POA: Diagnosis not present

## 2018-12-13 DIAGNOSIS — I10 Essential (primary) hypertension: Secondary | ICD-10-CM | POA: Diagnosis not present

## 2018-12-13 DIAGNOSIS — M6281 Muscle weakness (generalized): Secondary | ICD-10-CM | POA: Diagnosis not present

## 2018-12-14 DIAGNOSIS — M6281 Muscle weakness (generalized): Secondary | ICD-10-CM | POA: Diagnosis not present

## 2018-12-14 DIAGNOSIS — R2689 Other abnormalities of gait and mobility: Secondary | ICD-10-CM | POA: Diagnosis not present

## 2018-12-17 DIAGNOSIS — R293 Abnormal posture: Secondary | ICD-10-CM | POA: Diagnosis not present

## 2018-12-17 DIAGNOSIS — R278 Other lack of coordination: Secondary | ICD-10-CM | POA: Diagnosis not present

## 2018-12-17 DIAGNOSIS — R2689 Other abnormalities of gait and mobility: Secondary | ICD-10-CM | POA: Diagnosis not present

## 2018-12-17 DIAGNOSIS — M6281 Muscle weakness (generalized): Secondary | ICD-10-CM | POA: Diagnosis not present

## 2018-12-18 DIAGNOSIS — M6281 Muscle weakness (generalized): Secondary | ICD-10-CM | POA: Diagnosis not present

## 2018-12-18 DIAGNOSIS — R2689 Other abnormalities of gait and mobility: Secondary | ICD-10-CM | POA: Diagnosis not present

## 2018-12-19 DIAGNOSIS — R293 Abnormal posture: Secondary | ICD-10-CM | POA: Diagnosis not present

## 2018-12-19 DIAGNOSIS — R278 Other lack of coordination: Secondary | ICD-10-CM | POA: Diagnosis not present

## 2018-12-19 DIAGNOSIS — R2689 Other abnormalities of gait and mobility: Secondary | ICD-10-CM | POA: Diagnosis not present

## 2018-12-19 DIAGNOSIS — M6281 Muscle weakness (generalized): Secondary | ICD-10-CM | POA: Diagnosis not present

## 2018-12-20 DIAGNOSIS — R2689 Other abnormalities of gait and mobility: Secondary | ICD-10-CM | POA: Diagnosis not present

## 2018-12-20 DIAGNOSIS — M6281 Muscle weakness (generalized): Secondary | ICD-10-CM | POA: Diagnosis not present

## 2018-12-21 ENCOUNTER — Other Ambulatory Visit: Payer: Self-pay

## 2018-12-21 DIAGNOSIS — R2689 Other abnormalities of gait and mobility: Secondary | ICD-10-CM | POA: Diagnosis not present

## 2018-12-21 DIAGNOSIS — M6281 Muscle weakness (generalized): Secondary | ICD-10-CM | POA: Diagnosis not present

## 2018-12-24 DIAGNOSIS — R293 Abnormal posture: Secondary | ICD-10-CM | POA: Diagnosis not present

## 2018-12-24 DIAGNOSIS — R278 Other lack of coordination: Secondary | ICD-10-CM | POA: Diagnosis not present

## 2018-12-24 DIAGNOSIS — M6281 Muscle weakness (generalized): Secondary | ICD-10-CM | POA: Diagnosis not present

## 2018-12-24 DIAGNOSIS — R2689 Other abnormalities of gait and mobility: Secondary | ICD-10-CM | POA: Diagnosis not present

## 2018-12-25 DIAGNOSIS — M6281 Muscle weakness (generalized): Secondary | ICD-10-CM | POA: Diagnosis not present

## 2018-12-25 DIAGNOSIS — R2689 Other abnormalities of gait and mobility: Secondary | ICD-10-CM | POA: Diagnosis not present

## 2018-12-26 DIAGNOSIS — R2689 Other abnormalities of gait and mobility: Secondary | ICD-10-CM | POA: Diagnosis not present

## 2018-12-26 DIAGNOSIS — M159 Polyosteoarthritis, unspecified: Secondary | ICD-10-CM | POA: Diagnosis not present

## 2018-12-26 DIAGNOSIS — I10 Essential (primary) hypertension: Secondary | ICD-10-CM | POA: Diagnosis not present

## 2018-12-26 DIAGNOSIS — M542 Cervicalgia: Secondary | ICD-10-CM | POA: Diagnosis not present

## 2018-12-26 DIAGNOSIS — S81811A Laceration without foreign body, right lower leg, initial encounter: Secondary | ICD-10-CM | POA: Diagnosis not present

## 2018-12-26 DIAGNOSIS — M6281 Muscle weakness (generalized): Secondary | ICD-10-CM | POA: Diagnosis not present

## 2018-12-27 DIAGNOSIS — R2689 Other abnormalities of gait and mobility: Secondary | ICD-10-CM | POA: Diagnosis not present

## 2018-12-27 DIAGNOSIS — M6281 Muscle weakness (generalized): Secondary | ICD-10-CM | POA: Diagnosis not present

## 2018-12-28 DIAGNOSIS — M6281 Muscle weakness (generalized): Secondary | ICD-10-CM | POA: Diagnosis not present

## 2018-12-28 DIAGNOSIS — R2689 Other abnormalities of gait and mobility: Secondary | ICD-10-CM | POA: Diagnosis not present

## 2018-12-29 DIAGNOSIS — R278 Other lack of coordination: Secondary | ICD-10-CM | POA: Diagnosis not present

## 2018-12-29 DIAGNOSIS — M6281 Muscle weakness (generalized): Secondary | ICD-10-CM | POA: Diagnosis not present

## 2018-12-29 DIAGNOSIS — R293 Abnormal posture: Secondary | ICD-10-CM | POA: Diagnosis not present

## 2018-12-31 DIAGNOSIS — R2689 Other abnormalities of gait and mobility: Secondary | ICD-10-CM | POA: Diagnosis not present

## 2018-12-31 DIAGNOSIS — R278 Other lack of coordination: Secondary | ICD-10-CM | POA: Diagnosis not present

## 2018-12-31 DIAGNOSIS — R293 Abnormal posture: Secondary | ICD-10-CM | POA: Diagnosis not present

## 2018-12-31 DIAGNOSIS — M6281 Muscle weakness (generalized): Secondary | ICD-10-CM | POA: Diagnosis not present

## 2019-01-01 DIAGNOSIS — R2689 Other abnormalities of gait and mobility: Secondary | ICD-10-CM | POA: Diagnosis not present

## 2019-01-01 DIAGNOSIS — M5030 Other cervical disc degeneration, unspecified cervical region: Secondary | ICD-10-CM | POA: Diagnosis not present

## 2019-01-01 DIAGNOSIS — M62838 Other muscle spasm: Secondary | ICD-10-CM | POA: Diagnosis not present

## 2019-01-01 DIAGNOSIS — M6281 Muscle weakness (generalized): Secondary | ICD-10-CM | POA: Diagnosis not present

## 2019-01-01 DIAGNOSIS — M47812 Spondylosis without myelopathy or radiculopathy, cervical region: Secondary | ICD-10-CM | POA: Diagnosis not present

## 2019-01-01 DIAGNOSIS — M5136 Other intervertebral disc degeneration, lumbar region: Secondary | ICD-10-CM | POA: Diagnosis not present

## 2019-01-01 DIAGNOSIS — M5416 Radiculopathy, lumbar region: Secondary | ICD-10-CM | POA: Diagnosis not present

## 2019-01-02 DIAGNOSIS — M6281 Muscle weakness (generalized): Secondary | ICD-10-CM | POA: Diagnosis not present

## 2019-01-02 DIAGNOSIS — R2689 Other abnormalities of gait and mobility: Secondary | ICD-10-CM | POA: Diagnosis not present

## 2019-01-03 DIAGNOSIS — R2689 Other abnormalities of gait and mobility: Secondary | ICD-10-CM | POA: Diagnosis not present

## 2019-01-03 DIAGNOSIS — M6281 Muscle weakness (generalized): Secondary | ICD-10-CM | POA: Diagnosis not present

## 2019-01-04 DIAGNOSIS — R2689 Other abnormalities of gait and mobility: Secondary | ICD-10-CM | POA: Diagnosis not present

## 2019-01-04 DIAGNOSIS — M6281 Muscle weakness (generalized): Secondary | ICD-10-CM | POA: Diagnosis not present

## 2019-01-07 DIAGNOSIS — R278 Other lack of coordination: Secondary | ICD-10-CM | POA: Diagnosis not present

## 2019-01-07 DIAGNOSIS — M6281 Muscle weakness (generalized): Secondary | ICD-10-CM | POA: Diagnosis not present

## 2019-01-07 DIAGNOSIS — R293 Abnormal posture: Secondary | ICD-10-CM | POA: Diagnosis not present

## 2019-01-08 DIAGNOSIS — M6281 Muscle weakness (generalized): Secondary | ICD-10-CM | POA: Diagnosis not present

## 2019-01-08 DIAGNOSIS — R2689 Other abnormalities of gait and mobility: Secondary | ICD-10-CM | POA: Diagnosis not present

## 2019-01-08 DIAGNOSIS — R278 Other lack of coordination: Secondary | ICD-10-CM | POA: Diagnosis not present

## 2019-01-08 DIAGNOSIS — R293 Abnormal posture: Secondary | ICD-10-CM | POA: Diagnosis not present

## 2019-01-09 DIAGNOSIS — J449 Chronic obstructive pulmonary disease, unspecified: Secondary | ICD-10-CM | POA: Diagnosis not present

## 2019-01-09 DIAGNOSIS — G309 Alzheimer's disease, unspecified: Secondary | ICD-10-CM | POA: Diagnosis not present

## 2019-01-09 DIAGNOSIS — R278 Other lack of coordination: Secondary | ICD-10-CM | POA: Diagnosis not present

## 2019-01-09 DIAGNOSIS — I10 Essential (primary) hypertension: Secondary | ICD-10-CM | POA: Diagnosis not present

## 2019-01-09 DIAGNOSIS — R2689 Other abnormalities of gait and mobility: Secondary | ICD-10-CM | POA: Diagnosis not present

## 2019-01-09 DIAGNOSIS — M159 Polyosteoarthritis, unspecified: Secondary | ICD-10-CM | POA: Diagnosis not present

## 2019-01-09 DIAGNOSIS — M6281 Muscle weakness (generalized): Secondary | ICD-10-CM | POA: Diagnosis not present

## 2019-01-09 DIAGNOSIS — R293 Abnormal posture: Secondary | ICD-10-CM | POA: Diagnosis not present

## 2019-01-10 DIAGNOSIS — R293 Abnormal posture: Secondary | ICD-10-CM | POA: Diagnosis not present

## 2019-01-10 DIAGNOSIS — M6281 Muscle weakness (generalized): Secondary | ICD-10-CM | POA: Diagnosis not present

## 2019-01-10 DIAGNOSIS — R2689 Other abnormalities of gait and mobility: Secondary | ICD-10-CM | POA: Diagnosis not present

## 2019-01-10 DIAGNOSIS — R278 Other lack of coordination: Secondary | ICD-10-CM | POA: Diagnosis not present

## 2019-01-11 DIAGNOSIS — R278 Other lack of coordination: Secondary | ICD-10-CM | POA: Diagnosis not present

## 2019-01-11 DIAGNOSIS — M6281 Muscle weakness (generalized): Secondary | ICD-10-CM | POA: Diagnosis not present

## 2019-01-11 DIAGNOSIS — R293 Abnormal posture: Secondary | ICD-10-CM | POA: Diagnosis not present

## 2019-01-11 DIAGNOSIS — R2689 Other abnormalities of gait and mobility: Secondary | ICD-10-CM | POA: Diagnosis not present

## 2019-01-14 DIAGNOSIS — R278 Other lack of coordination: Secondary | ICD-10-CM | POA: Diagnosis not present

## 2019-01-14 DIAGNOSIS — R293 Abnormal posture: Secondary | ICD-10-CM | POA: Diagnosis not present

## 2019-01-14 DIAGNOSIS — M6281 Muscle weakness (generalized): Secondary | ICD-10-CM | POA: Diagnosis not present

## 2019-01-14 DIAGNOSIS — R2689 Other abnormalities of gait and mobility: Secondary | ICD-10-CM | POA: Diagnosis not present

## 2019-01-15 DIAGNOSIS — R2689 Other abnormalities of gait and mobility: Secondary | ICD-10-CM | POA: Diagnosis not present

## 2019-01-15 DIAGNOSIS — M6281 Muscle weakness (generalized): Secondary | ICD-10-CM | POA: Diagnosis not present

## 2019-01-15 DIAGNOSIS — R278 Other lack of coordination: Secondary | ICD-10-CM | POA: Diagnosis not present

## 2019-01-15 DIAGNOSIS — R293 Abnormal posture: Secondary | ICD-10-CM | POA: Diagnosis not present

## 2019-01-16 DIAGNOSIS — M6281 Muscle weakness (generalized): Secondary | ICD-10-CM | POA: Diagnosis not present

## 2019-01-16 DIAGNOSIS — M159 Polyosteoarthritis, unspecified: Secondary | ICD-10-CM | POA: Diagnosis not present

## 2019-01-16 DIAGNOSIS — G309 Alzheimer's disease, unspecified: Secondary | ICD-10-CM | POA: Diagnosis not present

## 2019-01-16 DIAGNOSIS — N3001 Acute cystitis with hematuria: Secondary | ICD-10-CM | POA: Diagnosis not present

## 2019-01-16 DIAGNOSIS — S81811D Laceration without foreign body, right lower leg, subsequent encounter: Secondary | ICD-10-CM | POA: Diagnosis not present

## 2019-01-16 DIAGNOSIS — R2689 Other abnormalities of gait and mobility: Secondary | ICD-10-CM | POA: Diagnosis not present

## 2019-01-17 DIAGNOSIS — R278 Other lack of coordination: Secondary | ICD-10-CM | POA: Diagnosis not present

## 2019-01-17 DIAGNOSIS — R293 Abnormal posture: Secondary | ICD-10-CM | POA: Diagnosis not present

## 2019-01-17 DIAGNOSIS — R2689 Other abnormalities of gait and mobility: Secondary | ICD-10-CM | POA: Diagnosis not present

## 2019-01-17 DIAGNOSIS — M6281 Muscle weakness (generalized): Secondary | ICD-10-CM | POA: Diagnosis not present

## 2019-01-18 DIAGNOSIS — R278 Other lack of coordination: Secondary | ICD-10-CM | POA: Diagnosis not present

## 2019-01-18 DIAGNOSIS — M6281 Muscle weakness (generalized): Secondary | ICD-10-CM | POA: Diagnosis not present

## 2019-01-18 DIAGNOSIS — R293 Abnormal posture: Secondary | ICD-10-CM | POA: Diagnosis not present

## 2019-01-18 DIAGNOSIS — N390 Urinary tract infection, site not specified: Secondary | ICD-10-CM | POA: Diagnosis not present

## 2019-01-18 DIAGNOSIS — R2689 Other abnormalities of gait and mobility: Secondary | ICD-10-CM | POA: Diagnosis not present

## 2019-01-18 DIAGNOSIS — Z79899 Other long term (current) drug therapy: Secondary | ICD-10-CM | POA: Diagnosis not present

## 2019-01-19 DIAGNOSIS — R293 Abnormal posture: Secondary | ICD-10-CM | POA: Diagnosis not present

## 2019-01-19 DIAGNOSIS — R278 Other lack of coordination: Secondary | ICD-10-CM | POA: Diagnosis not present

## 2019-01-19 DIAGNOSIS — M6281 Muscle weakness (generalized): Secondary | ICD-10-CM | POA: Diagnosis not present

## 2019-01-21 DIAGNOSIS — R2689 Other abnormalities of gait and mobility: Secondary | ICD-10-CM | POA: Diagnosis not present

## 2019-01-21 DIAGNOSIS — M6281 Muscle weakness (generalized): Secondary | ICD-10-CM | POA: Diagnosis not present

## 2019-01-21 DIAGNOSIS — R293 Abnormal posture: Secondary | ICD-10-CM | POA: Diagnosis not present

## 2019-01-21 DIAGNOSIS — R278 Other lack of coordination: Secondary | ICD-10-CM | POA: Diagnosis not present

## 2019-01-22 DIAGNOSIS — M6281 Muscle weakness (generalized): Secondary | ICD-10-CM | POA: Diagnosis not present

## 2019-01-22 DIAGNOSIS — R293 Abnormal posture: Secondary | ICD-10-CM | POA: Diagnosis not present

## 2019-01-22 DIAGNOSIS — R2689 Other abnormalities of gait and mobility: Secondary | ICD-10-CM | POA: Diagnosis not present

## 2019-01-22 DIAGNOSIS — R278 Other lack of coordination: Secondary | ICD-10-CM | POA: Diagnosis not present

## 2019-01-23 DIAGNOSIS — M542 Cervicalgia: Secondary | ICD-10-CM | POA: Diagnosis not present

## 2019-01-23 DIAGNOSIS — M6281 Muscle weakness (generalized): Secondary | ICD-10-CM | POA: Diagnosis not present

## 2019-01-23 DIAGNOSIS — R278 Other lack of coordination: Secondary | ICD-10-CM | POA: Diagnosis not present

## 2019-01-23 DIAGNOSIS — R293 Abnormal posture: Secondary | ICD-10-CM | POA: Diagnosis not present

## 2019-01-23 DIAGNOSIS — R2689 Other abnormalities of gait and mobility: Secondary | ICD-10-CM | POA: Diagnosis not present

## 2019-01-23 DIAGNOSIS — M159 Polyosteoarthritis, unspecified: Secondary | ICD-10-CM | POA: Diagnosis not present

## 2019-01-23 DIAGNOSIS — N3001 Acute cystitis with hematuria: Secondary | ICD-10-CM | POA: Diagnosis not present

## 2019-01-23 DIAGNOSIS — S81811D Laceration without foreign body, right lower leg, subsequent encounter: Secondary | ICD-10-CM | POA: Diagnosis not present

## 2019-01-24 DIAGNOSIS — N189 Chronic kidney disease, unspecified: Secondary | ICD-10-CM | POA: Diagnosis not present

## 2019-01-24 DIAGNOSIS — R319 Hematuria, unspecified: Secondary | ICD-10-CM | POA: Diagnosis not present

## 2019-01-24 DIAGNOSIS — R2689 Other abnormalities of gait and mobility: Secondary | ICD-10-CM | POA: Diagnosis not present

## 2019-01-24 DIAGNOSIS — R293 Abnormal posture: Secondary | ICD-10-CM | POA: Diagnosis not present

## 2019-01-24 DIAGNOSIS — M6281 Muscle weakness (generalized): Secondary | ICD-10-CM | POA: Diagnosis not present

## 2019-01-24 DIAGNOSIS — R278 Other lack of coordination: Secondary | ICD-10-CM | POA: Diagnosis not present

## 2019-01-24 DIAGNOSIS — Z79899 Other long term (current) drug therapy: Secondary | ICD-10-CM | POA: Diagnosis not present

## 2019-01-25 DIAGNOSIS — R2689 Other abnormalities of gait and mobility: Secondary | ICD-10-CM | POA: Diagnosis not present

## 2019-01-25 DIAGNOSIS — R293 Abnormal posture: Secondary | ICD-10-CM | POA: Diagnosis not present

## 2019-01-25 DIAGNOSIS — M6281 Muscle weakness (generalized): Secondary | ICD-10-CM | POA: Diagnosis not present

## 2019-01-25 DIAGNOSIS — R278 Other lack of coordination: Secondary | ICD-10-CM | POA: Diagnosis not present

## 2019-01-28 DIAGNOSIS — R2689 Other abnormalities of gait and mobility: Secondary | ICD-10-CM | POA: Diagnosis not present

## 2019-01-28 DIAGNOSIS — R293 Abnormal posture: Secondary | ICD-10-CM | POA: Diagnosis not present

## 2019-01-28 DIAGNOSIS — M6281 Muscle weakness (generalized): Secondary | ICD-10-CM | POA: Diagnosis not present

## 2019-01-28 DIAGNOSIS — R278 Other lack of coordination: Secondary | ICD-10-CM | POA: Diagnosis not present

## 2019-01-29 DIAGNOSIS — M6281 Muscle weakness (generalized): Secondary | ICD-10-CM | POA: Diagnosis not present

## 2019-01-29 DIAGNOSIS — R278 Other lack of coordination: Secondary | ICD-10-CM | POA: Diagnosis not present

## 2019-01-29 DIAGNOSIS — R293 Abnormal posture: Secondary | ICD-10-CM | POA: Diagnosis not present

## 2019-01-29 DIAGNOSIS — R2689 Other abnormalities of gait and mobility: Secondary | ICD-10-CM | POA: Diagnosis not present

## 2019-01-30 ENCOUNTER — Other Ambulatory Visit: Payer: Self-pay | Admitting: Physical Medicine and Rehabilitation

## 2019-01-30 DIAGNOSIS — M5412 Radiculopathy, cervical region: Secondary | ICD-10-CM

## 2019-01-30 DIAGNOSIS — M069 Rheumatoid arthritis, unspecified: Secondary | ICD-10-CM | POA: Diagnosis not present

## 2019-01-30 DIAGNOSIS — M159 Polyosteoarthritis, unspecified: Secondary | ICD-10-CM | POA: Diagnosis not present

## 2019-01-30 DIAGNOSIS — R2689 Other abnormalities of gait and mobility: Secondary | ICD-10-CM | POA: Diagnosis not present

## 2019-01-30 DIAGNOSIS — I10 Essential (primary) hypertension: Secondary | ICD-10-CM | POA: Diagnosis not present

## 2019-01-30 DIAGNOSIS — R293 Abnormal posture: Secondary | ICD-10-CM | POA: Diagnosis not present

## 2019-01-30 DIAGNOSIS — N3001 Acute cystitis with hematuria: Secondary | ICD-10-CM | POA: Diagnosis not present

## 2019-01-30 DIAGNOSIS — R278 Other lack of coordination: Secondary | ICD-10-CM | POA: Diagnosis not present

## 2019-01-30 DIAGNOSIS — M6281 Muscle weakness (generalized): Secondary | ICD-10-CM | POA: Diagnosis not present

## 2019-01-30 DIAGNOSIS — M542 Cervicalgia: Secondary | ICD-10-CM | POA: Diagnosis not present

## 2019-01-31 DIAGNOSIS — R278 Other lack of coordination: Secondary | ICD-10-CM | POA: Diagnosis not present

## 2019-01-31 DIAGNOSIS — R2689 Other abnormalities of gait and mobility: Secondary | ICD-10-CM | POA: Diagnosis not present

## 2019-01-31 DIAGNOSIS — Z79899 Other long term (current) drug therapy: Secondary | ICD-10-CM | POA: Diagnosis not present

## 2019-01-31 DIAGNOSIS — M6281 Muscle weakness (generalized): Secondary | ICD-10-CM | POA: Diagnosis not present

## 2019-01-31 DIAGNOSIS — N390 Urinary tract infection, site not specified: Secondary | ICD-10-CM | POA: Diagnosis not present

## 2019-01-31 DIAGNOSIS — R293 Abnormal posture: Secondary | ICD-10-CM | POA: Diagnosis not present

## 2019-02-01 DIAGNOSIS — R2689 Other abnormalities of gait and mobility: Secondary | ICD-10-CM | POA: Diagnosis not present

## 2019-02-01 DIAGNOSIS — R278 Other lack of coordination: Secondary | ICD-10-CM | POA: Diagnosis not present

## 2019-02-01 DIAGNOSIS — R293 Abnormal posture: Secondary | ICD-10-CM | POA: Diagnosis not present

## 2019-02-01 DIAGNOSIS — M6281 Muscle weakness (generalized): Secondary | ICD-10-CM | POA: Diagnosis not present

## 2019-02-04 ENCOUNTER — Other Ambulatory Visit: Payer: Self-pay

## 2019-02-04 ENCOUNTER — Ambulatory Visit
Admission: RE | Admit: 2019-02-04 | Discharge: 2019-02-04 | Disposition: A | Discharge status: Home / Self Care | Payer: Medicare Other | Source: Ambulatory Visit | Attending: Physical Medicine and Rehabilitation | Admitting: Physical Medicine and Rehabilitation

## 2019-02-04 DIAGNOSIS — R293 Abnormal posture: Secondary | ICD-10-CM | POA: Diagnosis not present

## 2019-02-04 DIAGNOSIS — M542 Cervicalgia: Secondary | ICD-10-CM | POA: Diagnosis not present

## 2019-02-04 DIAGNOSIS — M5412 Radiculopathy, cervical region: Secondary | ICD-10-CM | POA: Insufficient documentation

## 2019-02-04 DIAGNOSIS — R278 Other lack of coordination: Secondary | ICD-10-CM | POA: Diagnosis not present

## 2019-02-04 DIAGNOSIS — M6281 Muscle weakness (generalized): Secondary | ICD-10-CM | POA: Diagnosis not present

## 2019-02-04 DIAGNOSIS — R2689 Other abnormalities of gait and mobility: Secondary | ICD-10-CM | POA: Diagnosis not present

## 2019-02-05 DIAGNOSIS — R2689 Other abnormalities of gait and mobility: Secondary | ICD-10-CM | POA: Diagnosis not present

## 2019-02-05 DIAGNOSIS — M6281 Muscle weakness (generalized): Secondary | ICD-10-CM | POA: Diagnosis not present

## 2019-02-05 DIAGNOSIS — R278 Other lack of coordination: Secondary | ICD-10-CM | POA: Diagnosis not present

## 2019-02-05 DIAGNOSIS — R293 Abnormal posture: Secondary | ICD-10-CM | POA: Diagnosis not present

## 2019-02-06 DIAGNOSIS — M6281 Muscle weakness (generalized): Secondary | ICD-10-CM | POA: Diagnosis not present

## 2019-02-06 DIAGNOSIS — I10 Essential (primary) hypertension: Secondary | ICD-10-CM | POA: Diagnosis not present

## 2019-02-06 DIAGNOSIS — M069 Rheumatoid arthritis, unspecified: Secondary | ICD-10-CM | POA: Diagnosis not present

## 2019-02-06 DIAGNOSIS — M159 Polyosteoarthritis, unspecified: Secondary | ICD-10-CM | POA: Diagnosis not present

## 2019-02-06 DIAGNOSIS — M542 Cervicalgia: Secondary | ICD-10-CM | POA: Diagnosis not present

## 2019-02-06 DIAGNOSIS — R2689 Other abnormalities of gait and mobility: Secondary | ICD-10-CM | POA: Diagnosis not present

## 2019-02-07 DIAGNOSIS — R2689 Other abnormalities of gait and mobility: Secondary | ICD-10-CM | POA: Diagnosis not present

## 2019-02-07 DIAGNOSIS — R278 Other lack of coordination: Secondary | ICD-10-CM | POA: Diagnosis not present

## 2019-02-07 DIAGNOSIS — R293 Abnormal posture: Secondary | ICD-10-CM | POA: Diagnosis not present

## 2019-02-07 DIAGNOSIS — M6281 Muscle weakness (generalized): Secondary | ICD-10-CM | POA: Diagnosis not present

## 2019-02-08 DIAGNOSIS — M6281 Muscle weakness (generalized): Secondary | ICD-10-CM | POA: Diagnosis not present

## 2019-02-08 DIAGNOSIS — R293 Abnormal posture: Secondary | ICD-10-CM | POA: Diagnosis not present

## 2019-02-08 DIAGNOSIS — R278 Other lack of coordination: Secondary | ICD-10-CM | POA: Diagnosis not present

## 2019-02-08 DIAGNOSIS — R2689 Other abnormalities of gait and mobility: Secondary | ICD-10-CM | POA: Diagnosis not present

## 2019-02-09 DIAGNOSIS — M6281 Muscle weakness (generalized): Secondary | ICD-10-CM | POA: Diagnosis not present

## 2019-02-09 DIAGNOSIS — R293 Abnormal posture: Secondary | ICD-10-CM | POA: Diagnosis not present

## 2019-02-09 DIAGNOSIS — R278 Other lack of coordination: Secondary | ICD-10-CM | POA: Diagnosis not present

## 2019-02-11 DIAGNOSIS — M6281 Muscle weakness (generalized): Secondary | ICD-10-CM | POA: Diagnosis not present

## 2019-02-11 DIAGNOSIS — R293 Abnormal posture: Secondary | ICD-10-CM | POA: Diagnosis not present

## 2019-02-11 DIAGNOSIS — R278 Other lack of coordination: Secondary | ICD-10-CM | POA: Diagnosis not present

## 2019-02-11 DIAGNOSIS — R2689 Other abnormalities of gait and mobility: Secondary | ICD-10-CM | POA: Diagnosis not present

## 2019-02-12 DIAGNOSIS — R2689 Other abnormalities of gait and mobility: Secondary | ICD-10-CM | POA: Diagnosis not present

## 2019-02-12 DIAGNOSIS — R293 Abnormal posture: Secondary | ICD-10-CM | POA: Diagnosis not present

## 2019-02-12 DIAGNOSIS — R278 Other lack of coordination: Secondary | ICD-10-CM | POA: Diagnosis not present

## 2019-02-12 DIAGNOSIS — M6281 Muscle weakness (generalized): Secondary | ICD-10-CM | POA: Diagnosis not present

## 2019-02-13 DIAGNOSIS — R278 Other lack of coordination: Secondary | ICD-10-CM | POA: Diagnosis not present

## 2019-02-13 DIAGNOSIS — M6281 Muscle weakness (generalized): Secondary | ICD-10-CM | POA: Diagnosis not present

## 2019-02-13 DIAGNOSIS — R2689 Other abnormalities of gait and mobility: Secondary | ICD-10-CM | POA: Diagnosis not present

## 2019-02-13 DIAGNOSIS — R293 Abnormal posture: Secondary | ICD-10-CM | POA: Diagnosis not present

## 2019-02-14 DIAGNOSIS — M6281 Muscle weakness (generalized): Secondary | ICD-10-CM | POA: Diagnosis not present

## 2019-02-14 DIAGNOSIS — R2689 Other abnormalities of gait and mobility: Secondary | ICD-10-CM | POA: Diagnosis not present

## 2019-02-15 DIAGNOSIS — R278 Other lack of coordination: Secondary | ICD-10-CM | POA: Diagnosis not present

## 2019-02-15 DIAGNOSIS — R293 Abnormal posture: Secondary | ICD-10-CM | POA: Diagnosis not present

## 2019-02-15 DIAGNOSIS — R2689 Other abnormalities of gait and mobility: Secondary | ICD-10-CM | POA: Diagnosis not present

## 2019-02-15 DIAGNOSIS — M6281 Muscle weakness (generalized): Secondary | ICD-10-CM | POA: Diagnosis not present

## 2019-02-18 DIAGNOSIS — M6281 Muscle weakness (generalized): Secondary | ICD-10-CM | POA: Diagnosis not present

## 2019-02-18 DIAGNOSIS — R278 Other lack of coordination: Secondary | ICD-10-CM | POA: Diagnosis not present

## 2019-02-18 DIAGNOSIS — R293 Abnormal posture: Secondary | ICD-10-CM | POA: Diagnosis not present

## 2019-02-18 DIAGNOSIS — R2689 Other abnormalities of gait and mobility: Secondary | ICD-10-CM | POA: Diagnosis not present

## 2019-02-19 DIAGNOSIS — R293 Abnormal posture: Secondary | ICD-10-CM | POA: Diagnosis not present

## 2019-02-19 DIAGNOSIS — R278 Other lack of coordination: Secondary | ICD-10-CM | POA: Diagnosis not present

## 2019-02-19 DIAGNOSIS — M6281 Muscle weakness (generalized): Secondary | ICD-10-CM | POA: Diagnosis not present

## 2019-02-19 DIAGNOSIS — R2689 Other abnormalities of gait and mobility: Secondary | ICD-10-CM | POA: Diagnosis not present

## 2019-02-20 DIAGNOSIS — M6281 Muscle weakness (generalized): Secondary | ICD-10-CM | POA: Diagnosis not present

## 2019-02-20 DIAGNOSIS — R293 Abnormal posture: Secondary | ICD-10-CM | POA: Diagnosis not present

## 2019-02-20 DIAGNOSIS — N3000 Acute cystitis without hematuria: Secondary | ICD-10-CM | POA: Diagnosis not present

## 2019-02-20 DIAGNOSIS — M159 Polyosteoarthritis, unspecified: Secondary | ICD-10-CM | POA: Diagnosis not present

## 2019-02-20 DIAGNOSIS — M542 Cervicalgia: Secondary | ICD-10-CM | POA: Diagnosis not present

## 2019-02-20 DIAGNOSIS — R2689 Other abnormalities of gait and mobility: Secondary | ICD-10-CM | POA: Diagnosis not present

## 2019-02-20 DIAGNOSIS — I10 Essential (primary) hypertension: Secondary | ICD-10-CM | POA: Diagnosis not present

## 2019-02-20 DIAGNOSIS — R278 Other lack of coordination: Secondary | ICD-10-CM | POA: Diagnosis not present

## 2019-02-21 DIAGNOSIS — M6281 Muscle weakness (generalized): Secondary | ICD-10-CM | POA: Diagnosis not present

## 2019-02-21 DIAGNOSIS — R2689 Other abnormalities of gait and mobility: Secondary | ICD-10-CM | POA: Diagnosis not present

## 2019-02-22 ENCOUNTER — Emergency Department: Payer: Medicare Other

## 2019-02-22 ENCOUNTER — Inpatient Hospital Stay
Admission: EM | Admit: 2019-02-22 | Discharge: 2019-02-24 | DRG: 065 | Disposition: A | Discharge status: Skilled Nursing Facility | Payer: Medicare Other | Attending: Internal Medicine | Admitting: Internal Medicine

## 2019-02-22 ENCOUNTER — Other Ambulatory Visit: Payer: Self-pay

## 2019-02-22 DIAGNOSIS — N390 Urinary tract infection, site not specified: Secondary | ICD-10-CM | POA: Diagnosis present

## 2019-02-22 DIAGNOSIS — K219 Gastro-esophageal reflux disease without esophagitis: Secondary | ICD-10-CM | POA: Diagnosis present

## 2019-02-22 DIAGNOSIS — Z800 Family history of malignant neoplasm of digestive organs: Secondary | ICD-10-CM | POA: Diagnosis not present

## 2019-02-22 DIAGNOSIS — M4722 Other spondylosis with radiculopathy, cervical region: Secondary | ICD-10-CM | POA: Diagnosis present

## 2019-02-22 DIAGNOSIS — M2550 Pain in unspecified joint: Secondary | ICD-10-CM | POA: Diagnosis not present

## 2019-02-22 DIAGNOSIS — M5412 Radiculopathy, cervical region: Secondary | ICD-10-CM | POA: Diagnosis not present

## 2019-02-22 DIAGNOSIS — Z882 Allergy status to sulfonamides status: Secondary | ICD-10-CM | POA: Diagnosis not present

## 2019-02-22 DIAGNOSIS — Z881 Allergy status to other antibiotic agents status: Secondary | ICD-10-CM

## 2019-02-22 DIAGNOSIS — Z20828 Contact with and (suspected) exposure to other viral communicable diseases: Secondary | ICD-10-CM | POA: Diagnosis present

## 2019-02-22 DIAGNOSIS — Z820 Family history of epilepsy and other diseases of the nervous system: Secondary | ICD-10-CM | POA: Diagnosis not present

## 2019-02-22 DIAGNOSIS — Z7982 Long term (current) use of aspirin: Secondary | ICD-10-CM

## 2019-02-22 DIAGNOSIS — Z85820 Personal history of malignant melanoma of skin: Secondary | ICD-10-CM | POA: Diagnosis not present

## 2019-02-22 DIAGNOSIS — Z66 Do not resuscitate: Secondary | ICD-10-CM | POA: Diagnosis present

## 2019-02-22 DIAGNOSIS — Z8673 Personal history of transient ischemic attack (TIA), and cerebral infarction without residual deficits: Secondary | ICD-10-CM | POA: Diagnosis not present

## 2019-02-22 DIAGNOSIS — R519 Headache, unspecified: Secondary | ICD-10-CM | POA: Diagnosis not present

## 2019-02-22 DIAGNOSIS — I10 Essential (primary) hypertension: Secondary | ICD-10-CM | POA: Diagnosis not present

## 2019-02-22 DIAGNOSIS — J449 Chronic obstructive pulmonary disease, unspecified: Secondary | ICD-10-CM | POA: Diagnosis present

## 2019-02-22 DIAGNOSIS — Z8249 Family history of ischemic heart disease and other diseases of the circulatory system: Secondary | ICD-10-CM | POA: Diagnosis not present

## 2019-02-22 DIAGNOSIS — Z885 Allergy status to narcotic agent status: Secondary | ICD-10-CM | POA: Diagnosis not present

## 2019-02-22 DIAGNOSIS — I129 Hypertensive chronic kidney disease with stage 1 through stage 4 chronic kidney disease, or unspecified chronic kidney disease: Secondary | ICD-10-CM | POA: Diagnosis present

## 2019-02-22 DIAGNOSIS — E785 Hyperlipidemia, unspecified: Secondary | ICD-10-CM | POA: Diagnosis present

## 2019-02-22 DIAGNOSIS — Z823 Family history of stroke: Secondary | ICD-10-CM | POA: Diagnosis not present

## 2019-02-22 DIAGNOSIS — M47812 Spondylosis without myelopathy or radiculopathy, cervical region: Secondary | ICD-10-CM | POA: Diagnosis present

## 2019-02-22 DIAGNOSIS — Z7401 Bed confinement status: Secondary | ICD-10-CM | POA: Diagnosis not present

## 2019-02-22 DIAGNOSIS — I639 Cerebral infarction, unspecified: Principal | ICD-10-CM | POA: Diagnosis present

## 2019-02-22 DIAGNOSIS — Z7983 Long term (current) use of bisphosphonates: Secondary | ICD-10-CM

## 2019-02-22 DIAGNOSIS — I6389 Other cerebral infarction: Secondary | ICD-10-CM | POA: Diagnosis not present

## 2019-02-22 DIAGNOSIS — N183 Chronic kidney disease, stage 3 unspecified: Secondary | ICD-10-CM | POA: Diagnosis present

## 2019-02-22 DIAGNOSIS — G459 Transient cerebral ischemic attack, unspecified: Secondary | ICD-10-CM | POA: Diagnosis not present

## 2019-02-22 DIAGNOSIS — G589 Mononeuropathy, unspecified: Secondary | ICD-10-CM | POA: Diagnosis present

## 2019-02-22 DIAGNOSIS — M542 Cervicalgia: Secondary | ICD-10-CM | POA: Diagnosis not present

## 2019-02-22 DIAGNOSIS — M5481 Occipital neuralgia: Secondary | ICD-10-CM | POA: Diagnosis not present

## 2019-02-22 DIAGNOSIS — I6523 Occlusion and stenosis of bilateral carotid arteries: Secondary | ICD-10-CM | POA: Diagnosis not present

## 2019-02-22 LAB — BASIC METABOLIC PANEL
Anion gap: 9 (ref 5–15)
BUN: 26 mg/dL — ABNORMAL HIGH (ref 8–23)
CO2: 28 mmol/L (ref 22–32)
Calcium: 9.4 mg/dL (ref 8.9–10.3)
Chloride: 98 mmol/L (ref 98–111)
Creatinine, Ser: 1.24 mg/dL — ABNORMAL HIGH (ref 0.44–1.00)
GFR calc Af Amer: 43 mL/min — ABNORMAL LOW (ref >60)
GFR calc non Af Amer: 37 mL/min — ABNORMAL LOW (ref >60)
Glucose, Bld: 113 mg/dL — ABNORMAL HIGH (ref 70–99)
Potassium: 4 mmol/L (ref 3.5–5.1)
Sodium: 135 mmol/L (ref 135–145)

## 2019-02-22 LAB — CBC WITH DIFFERENTIAL/PLATELET
Abs Immature Granulocytes: 0.02 10*3/uL (ref 0.00–0.07)
Basophils Absolute: 0.1 10*3/uL (ref 0.0–0.1)
Basophils Relative: 1 %
Eosinophils Absolute: 0.4 10*3/uL (ref 0.0–0.5)
Eosinophils Relative: 5 %
HCT: 41.4 % (ref 36.0–46.0)
Hemoglobin: 13.2 g/dL (ref 12.0–15.0)
Immature Granulocytes: 0 %
Lymphocytes Relative: 35 %
Lymphs Abs: 2.9 10*3/uL (ref 0.7–4.0)
MCH: 31.1 pg (ref 26.0–34.0)
MCHC: 31.9 g/dL (ref 30.0–36.0)
MCV: 97.6 fL (ref 80.0–100.0)
Monocytes Absolute: 0.6 10*3/uL (ref 0.1–1.0)
Monocytes Relative: 8 %
Neutro Abs: 4.3 10*3/uL (ref 1.7–7.7)
Neutrophils Relative %: 51 %
Platelets: 283 10*3/uL (ref 150–400)
RBC: 4.24 MIL/uL (ref 3.87–5.11)
RDW: 13.8 % (ref 11.5–15.5)
WBC: 8.4 10*3/uL (ref 4.0–10.5)
nRBC: 0 % (ref 0.0–0.2)

## 2019-02-22 LAB — URINALYSIS, COMPLETE (UACMP) WITH MICROSCOPIC
Bilirubin Urine: NEGATIVE
Glucose, UA: NEGATIVE mg/dL
Ketones, ur: NEGATIVE mg/dL
Nitrite: NEGATIVE
Protein, ur: NEGATIVE mg/dL
Specific Gravity, Urine: 1.009 (ref 1.005–1.030)
WBC, UA: >50 WBC/hpf — ABNORMAL HIGH (ref 0–5)
pH: 6 (ref 5.0–8.0)

## 2019-02-22 LAB — PROTIME-INR
INR: 1 (ref 0.8–1.2)
Prothrombin Time: 13 seconds (ref 11.4–15.2)

## 2019-02-22 LAB — APTT: aPTT: 29 seconds (ref 24–36)

## 2019-02-22 MED ORDER — LABETALOL HCL 5 MG/ML IV SOLN: 10.0000 mg | Freq: Four times a day (QID) | INTRAVENOUS | Status: DC | PRN

## 2019-02-22 MED ORDER — ACETAMINOPHEN 160 MG/5ML PO SOLN
650.0000 mg | ORAL | Status: DC | PRN
  Filled 2019-02-22: qty 20.3

## 2019-02-22 MED ORDER — STROKE: EARLY STAGES OF RECOVERY BOOK
Freq: Once | Status: AC
  Administered 2019-02-22: 23:00:00

## 2019-02-22 MED ORDER — CEPHALEXIN 250 MG PO CAPS
250.0000 mg | ORAL_CAPSULE | Freq: Two times a day (BID) | ORAL | Status: DC
  Administered 2019-02-22 – 2019-02-24 (×4): 250 mg via ORAL
  Filled 2019-02-22 (×5): qty 1

## 2019-02-22 MED ORDER — ACETAMINOPHEN 325 MG PO TABS: 650.0000 mg | ORAL_TABLET | ORAL | Status: DC | PRN

## 2019-02-22 MED ORDER — ENOXAPARIN SODIUM 30 MG/0.3ML ~~LOC~~ SOLN
30.0000 mg | SUBCUTANEOUS | Status: DC
  Administered 2019-02-22 – 2019-02-23 (×2): 30 mg via SUBCUTANEOUS
  Filled 2019-02-22 (×3): qty 0.3

## 2019-02-22 MED ORDER — ASPIRIN EC 325 MG PO TBEC
325.0000 mg | DELAYED_RELEASE_TABLET | Freq: Every day | ORAL | Status: DC
  Administered 2019-02-22 – 2019-02-24 (×3): 325 mg via ORAL
  Filled 2019-02-22 (×3): qty 1

## 2019-02-22 MED ORDER — TAPENTADOL HCL ER 50 MG PO TB12: 50.0000 mg | ORAL_TABLET | Freq: Two times a day (BID) | ORAL | Status: DC

## 2019-02-22 MED ORDER — TAPENTADOL HCL 50 MG PO TABS: 50.0000 mg | ORAL_TABLET | Freq: Three times a day (TID) | ORAL | Status: DC

## 2019-02-22 MED ORDER — OXYCODONE HCL ER 10 MG PO T12A
10.0000 mg | EXTENDED_RELEASE_TABLET | Freq: Two times a day (BID) | ORAL | Status: DC
  Administered 2019-02-23 – 2019-02-24 (×3): 10 mg via ORAL
  Filled 2019-02-22 (×4): qty 1

## 2019-02-22 MED ORDER — ENOXAPARIN SODIUM 40 MG/0.4ML ~~LOC~~ SOLN: 40.0000 mg | SUBCUTANEOUS | Status: DC

## 2019-02-22 MED ORDER — ACETAMINOPHEN 650 MG RE SUPP: 650.0000 mg | RECTAL | Status: DC | PRN

## 2019-02-22 MED ORDER — SODIUM CHLORIDE 0.9 % IV SOLN
INTRAVENOUS | Status: AC
  Administered 2019-02-22: 23:00:00 via INTRAVENOUS

## 2019-02-22 NOTE — ED Notes (Signed)
Dr. Quale at bedside.  

## 2019-02-22 NOTE — ED Provider Notes (Addendum)
[facility] Emergency Department Provider Note ____________________________________________   First MD Initiated Contact with Patient 02/22/19 1239     (approximate)  I have reviewed the triage vital signs and the nursing notes.   HISTORY  Chief Complaint Headache  HPI Cindy Graham is a 85 y.o. female who reports that for over a month now she has been suffering from a severe upper neck pain located at the back of her lower skull and upper neck.  Her son also reports the same.  She has been taking Nucynta and has an appointment with pain medicine on Monday for plan for injection into that area.  Last night she reports the pain became very severe when she was at home, it came out and was radiating across the back of her head over her scalp and it was even to the point that light seem to be making her headache worse.  She reports the pain was very severe and worsened throughout the evening.  She reports now the headaches much better and she still having her upper neck pain which is been present for over a month.  She also had an MRI and is seeing pain management  She knows that she has a pinched nerve that is been causing this pain and she talked to a doctor about it who recommended that surgery would not be a good option  Presently the pains improved.  Denies headache.  No falls or injuries.  No chest pain or trouble breathing.  No pain or burning with urination, is currently on treatment for urinary tract infection but seems to be getting better.  Chere denies any numbness or weakness except for her weakness due to aging and she sees physical therapy for this.     Past Medical History:  Diagnosis Date   Arthritis    osteo   COPD (chronic obstructive pulmonary disease) (HCC)    GERD (gastroesophageal reflux disease)    Heart murmur    Hernia    Hyperlipidemia    Hypertension    Melanoma ([place])    face   Syncope and collapse    Urine  incontinence     Patient Active Problem List   Diagnosis Date Noted   Cellulitis 06/12/2018   Hyponatremia 11/24/2017   Headache 11/24/2017   Wound of left leg 11/01/2017   Constipation 09/05/2017   Other fatigue 12/22/2016   Tremor 03/29/2016   Amaurosis fugax 12/25/2015   Asthma 09/17/2015   PVD (peripheral vascular disease) ([employer]) 05/28/2015   B12 deficiency 08/29/2014   Chronic back pain greater than 3 months duration 04/23/2013   Chronic kidney disease (CKD), stage III (moderate) 04/12/2012   Hyperlipidemia LDL goal < 100 03/05/2012   UTI (urinary tract infection) 07/26/2011   Osteoarthritis 07/06/2011   Hypertension 02/21/2011   Depression/Anxiety 02/21/2011    Past Surgical History:  Procedure Laterality Date   ABDOMINAL HYSTERECTOMY     AMPUTATION TOE Right 06/14/2018   Procedure: AMPUTATION TOE - RIGHT 5TH;  Surgeon: Albertine Patricia, DPM;  Location: [facility] ORS;  Service: Podiatry;  Laterality: Right;   CHOLECYSTECTOMY     HERNIA REPAIR      Prior to Admission medications   Medication Sig Start Date End Date Taking? Authorizing Provider  acetaminophen (TYLENOL) 500 MG tablet Take 500 mg by mouth every 6 (six) hours as needed.    [provider]  alendronate (FOSAMAX) 70 MG tablet Take 70 mg by mouth every 7 (seven) days. 02/22/18  [provider]  alum & mag hydroxide-simeth ([place]) 200-200-20 MG/5ML suspension Take 30 mLs by mouth every 6 (six) hours as needed for indigestion or heartburn.    [provider]  aspirin 81 MG tablet Take 81 mg by mouth daily.      [provider]  Biotin 1000 MCG tablet Take 1,000 mcg by mouth daily.      [provider]  cholecalciferol (VITAMIN D) 400 UNITS TABS Take 400 Units by mouth 2 (two) times daily.      [provider]  enalapril (VASOTEC) 20 MG tablet TAKE ONE TABLET BY MOUTH EVERY DAY 09/15/17   Leone Haven, MD  Flaxseed, Linseed, (FLAX SEED  OIL) 1000 MG CAPS Take 1 capsule by mouth daily.      [provider]  fluticasone (FLOVENT HFA) 220 MCG/ACT inhaler Inhale 2 puffs into the lungs 2 (two) times daily. 08/12/17   Leone Haven, MD  gabapentin (NEURONTIN) 300 MG capsule Take 1 capsule (300 mg total) by mouth at bedtime. 12/06/17   Leone Haven, MD  guaifenesin (ROBITUSSIN) 100 MG/5ML syrup Take 200 mg by mouth 3 (three) times daily as needed for cough.    [provider]  lactulose (CHRONULAC) 10 GM/15ML solution Take 30 mLs (20 g total) by mouth daily as needed for mild constipation. 12/06/17   Leone Haven, MD  loperamide (IMODIUM) 2 MG capsule Take 2 mg by mouth as needed for diarrhea or loose stools.    [provider]  magnesium hydroxide (MILK OF MAGNESIA) 400 MG/5ML suspension Take 30 mLs by mouth daily as needed for mild constipation.    [provider]  Multiple Vitamin (MULTIVITAMIN) capsule Take 1 capsule by mouth daily.      [provider]  NARCAN 4 MG/0.1ML LIQD nasal spray kit Place 1 spray into the nose as directed. 03/19/18   [provider]  nitroGLYCERIN (NITROSTAT) 0.3 MG SL tablet DISSOLVE 1 TABLET UNDER TONGUE EVERY 5 MINUTES FOR 3 DOSES AS NEEDED FOR CHEST PAIN. IF NO RELIEF GO TO ER. 01/08/16   Leone Haven, MD  NUCYNTA 50 MG TABS tablet Take 50 mg by mouth 2 (two) times daily as needed (Take 1 tablet twice a day as needed).  04/12/13   [provider]  omeprazole (PRILOSEC) 20 MG capsule TAKE 1 CAPSULE BY MOUTH EVERY DAY. 11/15/17   Leone Haven, MD  oxybutynin (DITROPAN) 5 MG tablet TAKE ONE TABLET BY MOUTH EVERY DAY 09/15/17   Leone Haven, MD  propranolol ER (INDERAL LA) 60 MG 24 hr capsule TAKE 1 CAPSULE BY MOUTH EVERY DAY 01/13/17   Thersa Salt G, DO  sertraline (ZOLOFT) 100 MG tablet TAKE ONE TABLET BY MOUTH EVERY DAY 11/15/17   Leone Haven, MD  triamcinolone cream (KENALOG) 0.1 % Apply 1 application topically  2 (two) times daily.    [provider]  triamterene-hydrochlorothiazide (DYAZIDE) 37.5-25 MG capsule Take 1 capsule by mouth daily. 05/04/18   [provider]  trolamine salicylate (ASPERCREME) 10 % cream Apply 1 application topically as needed for muscle pain.    [provider]  verapamil (CALAN-SR) 240 MG CR tablet Take 240 mg by mouth at bedtime.    [provider]    Allergies Statins, Tramadol, Levaquin [levofloxacin in d5w], Neomycin-polymyxin-gramicidin, Penicillins cross reactors, Sulfa antibiotics, and Tetracyclines & related  Family History  Problem Relation Age of Onset   Stroke Father    Heart disease  Sister    Cancer Brother        colon   Heart disease Brother    Cancer Sister        colon   Heart attack Brother    Heart attack Son    Parkinson's disease Son     Social History Social History   Tobacco Use   Smoking status: Never Smoker   Smokeless tobacco: Never Used  Substance Use Topics   Alcohol use: No   Drug use: No    Review of Systems Constitutional: No fever/chills Eyes: No visual changes. ENT: See HPI regarding neck pain Cardiovascular: Denies chest pain. Respiratory: Denies shortness of breath. Gastrointestinal: No abdominal pain.   Genitourinary: Negative for dysuria. Musculoskeletal: Negative for back pain. Skin: Negative for rash. Neurological: Negative for areas of focal weakness or numbness.  Her headache is quite a bit better now.    ____________________________________________   PHYSICAL EXAM:  VITAL SIGNS: ED Triage Vitals  Enc Vitals Group     BP 02/22/19 1107 (!) 131/58     Pulse Rate 02/22/19 1107 69     Resp 02/22/19 1107 20     Temp 02/22/19 1107 98.2 F (36.8 C)     Temp Source 02/22/19 1107 Oral     SpO2 02/22/19 1107 95 %     Weight 02/22/19 1107 160 lb (72.6 kg)     Height 02/22/19 1107 _0  (1.6 m)     Head Circumference --      Peak Flow --      Pain Score  02/22/19 1111 8     Pain Loc --      Pain Edu? --      Excl. in [place]? --     Constitutional: Alert and oriented. Well appearing and in no acute distress.  She and her son are both very pleasant. Eyes: Conjunctivae are normal. Head: Atraumatic.  No photophobia.  Of note the patient does however report her headache is a lot better now and was a lot worse last night Nose: No congestion/rhinnorhea. Mouth/Throat: Mucous membranes are moist. Neck: No stridor.  When she turns her head back and forth she reports notable tenderness at the base of the occiput. Cardiovascular: Normal rate, regular rhythm. Grossly normal heart sounds.  Good peripheral circulation. Respiratory: Normal respiratory effort.  No retractions. Lungs CTAB. Gastrointestinal: Soft and nontender. No distention. Musculoskeletal: No lower extremity tenderness nor edema.  Moves all extremities well.  Normal sensation in all extremities.  Normal cranial nerve exam.  5 out of 5 strength.  Speech is clear without deficit. Neurologic:  Normal speech and language. No gross focal neurologic deficits are appreciated.  Skin:  Skin is warm, dry and intact. No rash noted. Psychiatric: Mood and affect are normal. Speech and behavior are normal.  ____________________________________________   LABS (all labs ordered are listed, but only abnormal results are displayed)  Labs Reviewed  URINALYSIS, COMPLETE (UACMP) WITH MICROSCOPIC - Abnormal; Notable for the following components:      Result Value   Color, Urine YELLOW (*)    APPearance CLOUDY (*)    Hgb urine dipstick SMALL (*)    Leukocytes,Ua LARGE (*)    WBC, UA >50 (*)    Bacteria, UA RARE (*)    All other components within normal limits  URINE CULTURE  CBC WITH DIFFERENTIAL/PLATELET  BASIC METABOLIC PANEL  PROTIME-INR  APTT   ____________________________________________  EKG   ____________________________________________  RADIOLOGY  Mr Cervical Spine Wo  Contrast  Result Date: 02/22/2019 CLINICAL DATA:  Headache and neck pain EXAM: MRI CERVICAL SPINE WITHOUT CONTRAST TECHNIQUE: Multiplanar, multisequence MR imaging of the cervical spine was performed. No intravenous contrast was administered. COMPARISON:  MRI cervical spine 02/04/2019 FINDINGS: Alignment: Mild anterolisthesis C4-5, C5-6, C6-7. Motion degraded image quality. Vertebrae: Hemangioma C7 vertebral body. Negative for fracture or mass. Cord: Cord evaluation limited due to motion. No cord lesion identified. Posterior Fossa, vertebral arteries, paraspinal tissues: Negative Disc levels: C1-2: Advanced arthropathy. Prominent pannus formation posterior to the dens causing mild spinal stenosis. This is unchanged from the recent study. C2-3: Negative C3-4: Negative C4-5: Mild anterolisthesis. Disc and facet degeneration without significant stenosis C5-6: Anterolisthesis with disc and facet degeneration. Mild spinal and foraminal stenosis unchanged C6-7: Mild anterolisthesis. Disc and facet degeneration without significant stenosis C7-T1: Negative IMPRESSION: Image quality degraded by extensive motion As best I can determine given the amount of motion, no change from the recent MRI cervical spine of 02/04/2019 Advanced arthropathy at C1-2 with prominent pannus posterior to the dens causing mild spinal stenosis. Electronically Signed   By: Franchot Gallo M.D.   On: 02/22/2019 14:43    CT cervical spine reviewed, no acute changes noted.  MRI finding of the brain pending ____________________________________________   PROCEDURES  Procedure(s) performed: None  Procedures  Critical Care performed: Yes, see critical care note(s)  CRITICAL CARE Performed by: Delman Kitten   Total critical care time: 30 minutes  Critical care time was exclusive of separately billable procedures and treating other patients.  Critical care was necessary to treat or prevent imminent or life-threatening  deterioration.  Critical care was time spent personally by me on the following activities: development of treatment plan with patient and/or surrogate as well as nursing, discussions with consultants, evaluation of patient's response to treatment, examination of patient, obtaining history from patient or surrogate, ordering and performing treatments and interventions, ordering and review of laboratory studies, ordering and review of radiographic studies, pulse oximetry and re-evaluation of patient's condition.  Discussed with Dr. Irish Elders.  ____________________________________________   INITIAL IMPRESSION / [place] / ED COURSE  Pertinent labs & imaging results that were available during my care of the patient were reviewed by me and considered in my medical decision making (see chart for details).   Patient was for evaluation of headache.  The clinical history and her report seem to suggest this is likely occipital etiology or neuralgia or potentially related to her cervical neck disease.  Very reassuring examination at this time and she reports without intervention at the headache has gotten better, but does seem to be worsened when she twists or turns her neck.  She does not have a fever.  She is following with pain management and has appointment Monday for some type of nerve injections for same.  Discussed with the patient and given her age will proceed with reevaluation with MRI of the cervical spine to evaluate for acute worsening of any impingement or new finding as well as obtain imaging of the brain given her associated headache and age we do wish to exclude other etiologies such as mass, hemorrhage etc. though my overall pretest probability for these are low.  Denies any infectious symptoms.  Clinical examination does not demonstrate any signs or symptoms that would make me concerned about an acute aneurysm rupture, meningitis, vascular thrombosis, hemorrhage etc.  Clinical  Course as of Feb 21 1610  Fri Feb 22, 2019  1608 Discussed with Dr. Irish Elders, he will  review imaging shortly and also review CT head for recommendations. Dr. Jimmye Norman aware and following up.    [MQ]    Clinical Course User Index [MQ] Delman Kitten, MD   ----------------------------------------- 3:31 PM on 02/22/2019 -----------------------------------------  None checked up with patient and her son.  She remains feeling improved.  Discussed further her urine infection, she reports that she just started another treatment as guided by doctors making housecalls who is been following her closely.  As she just started is only taken 1 day of antibiotic I will culture her urine and discussed with patient and her son and they will follow-up with doctors making house calls.  She does not have fever or systemic symptoms, and I do not see need to change antibiotic at this point as her current treatment is being guided by her primary  Ongoing care assigned to Dr. Jimmye Norman, follow-up on results of MRI brain, if no acute or concerning findings anticipate discharge to follow-up closely with her physician team. Follow-up on MRI   ____________________________________________   FINAL CLINICAL IMPRESSION(S) / ED DIAGNOSES  Final diagnoses:  Cervical spine arthritis  Occipital headache        Note:  This document was prepared using Dragon voice recognition software and may include unintentional dictation errors       Delman Kitten, MD 02/22/19 1533    Delman Kitten, MD 03/06/19 2159

## 2019-02-22 NOTE — Progress Notes (Signed)
Anticoagulation monitoring(Lovenox):  85yo  female ordered Lovenox 40 mg Q24h for DVT prevention  Filed Weights   02/22/19 1107  Weight: 160 lb (72.6 kg)   BMI 28   Lab Results  Component Value Date   CREATININE 1.24 (H) 02/22/2019   CREATININE 0.79 06/15/2018   CREATININE 0.79 06/14/2018   Estimated Creatinine Clearance: 25.9 mL/min (A) (by C-G formula based on SCr of 1.24 mg/dL (H)). Hemoglobin & Hematocrit     Component Value Date/Time   HGB 13.2 02/22/2019 1624   HGB 12.9 06/07/2013 1957   HCT 41.4 02/22/2019 1624   HCT 38.7 06/07/2013 1957     Per Protocol for Patient with estCrcl < 30 ml/min and BMI < 40, will transition to Lovenox 30 mg Q24h.     Paulina Fusi, PharmD, BCPS 02/22/2019 7:16 PM

## 2019-02-22 NOTE — ED Notes (Signed)
First Nurse Note: Pt to ED via POV for headache.  Pt son Linna Hoff is waiting in the car. Cell phone: [phone]

## 2019-02-22 NOTE — ED Notes (Signed)
Attempted to call report x 1 to 1C but was unsuccessful

## 2019-02-22 NOTE — ED Provider Notes (Signed)
Patient with acute stroke noted on MRI.  Discussion with neurology Dr. Irish Elders.  He recommended admission and full stroke work-up.   Earleen Newport, MD 02/22/19 1750

## 2019-02-22 NOTE — Progress Notes (Signed)
   02/22/19 1800  Clinical Encounter Type  Visited With Patient and family together  Visit Type Initial  Referral From Physician  Spiritual Encounters  Spiritual Needs Other (Comment) (Advance Directives Education)  Ch received an OR for AD creation. Ch educated the pt and son. Pt wants to make her son her 46. Pt will reach out to ch when she gets placed in a room and the document has been filled out.

## 2019-02-22 NOTE — ED Notes (Signed)
Pt otf for imaging 

## 2019-02-22 NOTE — ED Notes (Addendum)
Pt brought to room via wheelchair and transferred to bed with assistance. Reports headache and neck pain since yesterday. A&Ox4, NAD. Speech clear. Son reports MRI recently and dx of arthritis

## 2019-02-22 NOTE — Discharge Instructions (Addendum)
Please follow-up closely with your pain medicine physician as planned on Monday.  Also, please notify doctors making house calls that you had a urine culture performed today so that they may follow-up those results as they continue to treat your urinary tract infection.

## 2019-02-22 NOTE — ED Triage Notes (Signed)
Reports headache that started last night. States she has taken Tylenol without relief. Pt states mild photosensitivity. Denies NV or sensitivity to noise. Pt denies head trauma. Pt takes baby ASA daily. Pt alert and oriented X4, cooperative, RR even and unlabored, color WNL. Pt in NAD.

## 2019-02-22 NOTE — ED Notes (Signed)
Pt assisted to toilet with assistance 

## 2019-02-22 NOTE — Progress Notes (Signed)
Family Meeting Note  Advance Directive:yes  Today a meeting took place with the Patient.  And son  The following clinical team members were present during this meeting:MD  The following were discussed:Patient's diagnosis: cva, Patient's progosis: Unable to determine and Goals for treatment: DNR  Additional follow-up to be provided: outpatient [facility]  Chaplain consult to create advance directives Time spent during discussion:16 minutes  Bettey Costa, MD

## 2019-02-22 NOTE — H&P (Addendum)
[facility] [place] NAME: Cindy Graham    MR#:  [id number]  DATE OF BIRTH:  1921-06-09  DATE OF ADMISSION:  02/22/2019  PRIMARY CARE PHYSICIAN: [facility]   REQUESTING/REFERRING PHYSICIAN: dr Dwaine Deter  CHIEF COMPLAINT:   headache HISTORY OF PRESENT ILLNESS:  Cindy Graham  is a 85 y.o. female with a known history of neck pain seen by pain clinic on [place] and COPD who presents to the emergency room due to headache.  Patient reports that last night she had a 10 out of 10 severe headache.  She denies visual loss however does feel that her speech is impaired and she cannot think quite as clearly and she is also complaining of dizziness. No  weakness focal deficits are noted  Patient had MRI of the cervical spine and brain due to headache while in the emergency room and the MRI brain shows 6 mm acute infarct within the left cerebellum. There was question whether the patient had bleeding in her brain and therefore neurology has recommended CT head without contrast.  The CT head without contrast did not show subdural or intracranial hemorrhage.  PAST MEDICAL HISTORY:   Past Medical History:  Diagnosis Date  . Arthritis    osteo  . COPD (chronic obstructive pulmonary disease) ([place])   . GERD (gastroesophageal reflux disease)   . Heart murmur   . Hernia   . Hyperlipidemia   . Hypertension   . Melanoma ([place])    face  . Syncope and collapse   . Urine incontinence     PAST SURGICAL HISTORY:   Past Surgical History:  Procedure Laterality Date  . ABDOMINAL HYSTERECTOMY    . AMPUTATION TOE Right 06/14/2018   Procedure: AMPUTATION TOE - RIGHT 5TH;  Surgeon: Albertine Patricia, DPM;  Location: [facility] ORS;  Service: Podiatry;  Laterality: Right;  . CHOLECYSTECTOMY    . HERNIA REPAIR      SOCIAL HISTORY:   Social History   Tobacco Use  . Smoking status: Never Smoker  . Smokeless tobacco: Never Used  Substance Use Topics  .  Alcohol use: No    FAMILY HISTORY:   Family History  Problem Relation Age of Onset  . Stroke Father   . Heart disease Sister   . Cancer Brother        colon  . Heart disease Brother   . Cancer Sister        colon  . Heart attack Brother   . Heart attack Son   . Parkinson's disease Son     DRUG ALLERGIES:   Allergies  Allergen Reactions  . Statins Other (See Comments)    Very bad muscle aches  . Tramadol Itching  . Levaquin [Levofloxacin In D5w] Diarrhea and Other (See Comments)    Confusion, hallucinations  . Neomycin-Polymyxin-Gramicidin Other (See Comments)  . Penicillins Cross Reactors   . Sulfa Antibiotics   . Tetracyclines & Related     REVIEW OF SYSTEMS:   Review of Systems  Constitutional: Negative.  Negative for chills, fever and malaise/fatigue.  HENT: Negative.  Negative for ear discharge, ear pain, hearing loss, nosebleeds and sore throat.   Eyes: Negative.  Negative for blurred vision and pain.  Respiratory: Negative.  Negative for cough, hemoptysis, shortness of breath and wheezing.   Cardiovascular: Negative.  Negative for chest pain, palpitations and leg swelling.  Gastrointestinal: Negative.  Negative for abdominal pain, blood in stool, diarrhea, nausea and vomiting.  Genitourinary:  Positive for dysuria.  Musculoskeletal: Positive for neck pain. Negative for back pain.  Skin: Negative.   Neurological: Positive for dizziness, speech change and headaches. Negative for tremors, focal weakness and seizures.  Endo/Heme/Allergies: Negative.  Does not bruise/bleed easily.  Psychiatric/Behavioral: Negative.  Negative for depression, hallucinations and suicidal ideas.    MEDICATIONS AT HOME:   Prior to Admission medications   Medication Sig Start Date End Date Taking? Authorizing Provider  acetaminophen (TYLENOL) 500 MG tablet Take 500 mg by mouth every 6 (six) hours as needed.    [provider]  alendronate (FOSAMAX) 70 MG tablet Take 70 mg by  mouth every 7 (seven) days. 02/22/18   [provider]  alum & mag hydroxide-simeth ([place]) 200-200-20 MG/5ML suspension Take 30 mLs by mouth every 6 (six) hours as needed for indigestion or heartburn.    [provider]  aspirin 81 MG tablet Take 81 mg by mouth daily.      [provider]  Biotin 1000 MCG tablet Take 1,000 mcg by mouth daily.      [provider]  cholecalciferol (VITAMIN D) 400 UNITS TABS Take 400 Units by mouth 2 (two) times daily.      [provider]  enalapril (VASOTEC) 20 MG tablet TAKE ONE TABLET BY MOUTH EVERY DAY 09/15/17   Leone Haven, MD  Flaxseed, Linseed, (FLAX SEED OIL) 1000 MG CAPS Take 1 capsule by mouth daily.      [provider]  fluticasone (FLOVENT HFA) 220 MCG/ACT inhaler Inhale 2 puffs into the lungs 2 (two) times daily. 08/12/17   Leone Haven, MD  gabapentin (NEURONTIN) 300 MG capsule Take 1 capsule (300 mg total) by mouth at bedtime. 12/06/17   Leone Haven, MD  guaifenesin (ROBITUSSIN) 100 MG/5ML syrup Take 200 mg by mouth 3 (three) times daily as needed for cough.    [provider]  lactulose (CHRONULAC) 10 GM/15ML solution Take 30 mLs (20 g total) by mouth daily as needed for mild constipation. 12/06/17   Leone Haven, MD  loperamide (IMODIUM) 2 MG capsule Take 2 mg by mouth as needed for diarrhea or loose stools.    [provider]  magnesium hydroxide (MILK OF MAGNESIA) 400 MG/5ML suspension Take 30 mLs by mouth daily as needed for mild constipation.    [provider]  Multiple Vitamin (MULTIVITAMIN) capsule Take 1 capsule by mouth daily.      [provider]  NARCAN 4 MG/0.1ML LIQD nasal spray kit Place 1 spray into the nose as directed. 03/19/18   [provider]  nitroGLYCERIN (NITROSTAT) 0.3 MG SL tablet DISSOLVE 1 TABLET UNDER TONGUE EVERY 5 MINUTES FOR 3 DOSES AS NEEDED FOR CHEST PAIN. IF NO RELIEF GO TO ER. 01/08/16    Leone Haven, MD  NUCYNTA 50 MG TABS tablet Take 50 mg by mouth 2 (two) times daily as needed (Take 1 tablet twice a day as needed).  04/12/13   [provider]  omeprazole (PRILOSEC) 20 MG capsule TAKE 1 CAPSULE BY MOUTH EVERY DAY. 11/15/17   Leone Haven, MD  oxybutynin (DITROPAN) 5 MG tablet TAKE ONE TABLET BY MOUTH EVERY DAY 09/15/17   Leone Haven, MD  propranolol ER (INDERAL LA) 60 MG 24 hr capsule TAKE 1 CAPSULE BY MOUTH EVERY DAY 01/13/17   Thersa Salt G, DO  sertraline (ZOLOFT) 100 MG tablet TAKE ONE TABLET BY MOUTH EVERY DAY 11/15/17   Leone Haven, MD  triamcinolone  cream (KENALOG) 0.1 % Apply 1 application topically 2 (two) times daily.    [provider]  triamterene-hydrochlorothiazide (DYAZIDE) 37.5-25 MG capsule Take 1 capsule by mouth daily. 05/04/18   [provider]  trolamine salicylate (ASPERCREME) 10 % cream Apply 1 application topically as needed for muscle pain.    [provider]  verapamil (CALAN-SR) 240 MG CR tablet Take 240 mg by mouth at bedtime.    [provider]      VITAL SIGNS:  Blood pressure (!) 181/71, pulse 77, temperature 98.2 F (36.8 C), temperature source Oral, resp. rate 18, height _0  (1.6 m), weight 72.6 kg, SpO2 100 %.  PHYSICAL EXAMINATION:   Physical Exam Constitutional:      General: She is not in acute distress. HENT:     Head: Normocephalic.  Eyes:     General: No scleral icterus. Neck:     Musculoskeletal: Normal range of motion and neck supple.     Vascular: No JVD.     Trachea: No tracheal deviation.  Cardiovascular:     Rate and Rhythm: Normal rate and regular rhythm.     Heart sounds: Normal heart sounds. No murmur. No friction rub. No gallop.   Pulmonary:     Effort: Pulmonary effort is normal. No respiratory distress.     Breath sounds: Normal breath sounds. No wheezing or rales.  Chest:     Chest wall: No tenderness.  Abdominal:     General: Bowel sounds  are normal. There is no distension.     Palpations: Abdomen is soft. There is no mass.     Tenderness: There is no abdominal tenderness. There is no guarding or rebound.  Musculoskeletal: Normal range of motion.  Skin:    General: Skin is warm.     Findings: No erythema or rash.  Neurological:     Mental Status: She is alert and oriented to person, place, and time.  Psychiatric:        Judgment: Judgment normal.       LABORATORY PANEL:   CBC Recent Labs  Lab 02/22/19 1624  WBC 8.4  HGB 13.2  HCT 41.4  PLT 283   ------------------------------------------------------------------------------------------------------------------  Chemistries  Recent Labs  Lab 02/22/19 1624  NA 135  K 4.0  CL 98  CO2 28  GLUCOSE 113*  BUN 26*  CREATININE 1.24*  CALCIUM 9.4   ------------------------------------------------------------------------------------------------------------------  Cardiac Enzymes No results for input(s): TROPONINI in the last 168 hours. ------------------------------------------------------------------------------------------------------------------  RADIOLOGY:  Ct Head Wo Contrast  Addendum Date: 02/22/2019   ADDENDUM REPORT: 02/22/2019 17:36 ADDENDUM: This examination has been compared with the cranial MRI done earlier today (not available at the time of initial interpretation). There is no evidence of subarachnoid or subdural hemorrhage. Electronically Signed   By: Richardean Sale M.D.   On: 02/22/2019 17:36   Result Date: 02/22/2019 CLINICAL DATA:  Acute headache starting last night. Mild photosensitivity. No acute injury. EXAM: CT HEAD WITHOUT CONTRAST TECHNIQUE: Contiguous axial images were obtained from the base of the skull through the vertex without intravenous contrast. COMPARISON:  CT head 11/03/2017.  MRI brain 04/09/2016. FINDINGS: Brain: There is no evidence of acute intracranial hemorrhage, mass lesion, brain edema or extra-axial fluid collection.  Stable age-appropriate atrophy with mild prominence of the ventricles and subarachnoid spaces. There is no CT evidence of acute cortical infarction. There is a stable densely calcified left frontal meningioma, measuring up to 1.4 cm in diameter. Vascular: Intracranial vascular calcifications. No hyperdense  vessel identified. Skull: Stable exophytic right frontal osteoma. No acute calvarial findings. Sinuses/Orbits: The visualized paranasal sinuses and mastoid air cells are clear. No orbital abnormalities are seen. Other: None. IMPRESSION: 1. Stable examination without evidence of acute intracranial process. 2. Stable calcified left frontal meningioma. Electronically Signed: By: Richardean Sale M.D. On: 02/22/2019 16:21   Mr Brain Wo Contrast  Result Date: 02/22/2019 CLINICAL DATA:  Headache, acute, normal neuro exam. Additional history provided: Headache and neck pain since yesterday. EXAM: MRI HEAD WITHOUT CONTRAST TECHNIQUE: Multiplanar, multiecho pulse sequences of the brain and surrounding structures were obtained without intravenous contrast. COMPARISON:  Head CT 11/03/2017, brain MRI 04/09/2016 FINDINGS: Brain: Multiple sequences are motion degraded, limiting evaluation There is a 6 mm focus of restricted diffusion within the left cerebellum consistent with acute infarct. Corresponding T2/FLAIR hyperintensity at this site. Again demonstrated is a dural-based extra-axial mass overlying the left frontal convexity which measures 1.3 x 1.1 x 1.4 cm (AP x TV x CC) on the current examination. This is similar to minimally increased in size as compared to prior examination 04/09/2016, at which time measuring 1.3 x 1.1 x 1.3 cm. Minimal mass effect upon the underlying left frontal lobe. There is an apparent thin FLAIR hyperintense extra-axial collection overlying the right parietal lobe, as well as abnormal FLAIR hyperintensity within right parietal, occipital and temporal lobe sulci (for instance as seen on series  1033, image 41). Mild scattered T2/FLAIR hyperintensity within the cerebral white matter is nonspecific, but consistent with chronic small vessel ischemic disease. No midline shift.  Mild generalized parenchymal atrophy. Vascular: Flow voids maintained within the proximal large arterial vessels. Skull and upper cervical spine: Incompletely assessed cervical spondylosis. This includes prominent pannus formation at the skull base and C1-C2 level with resultant mild craniocervical/spinal canal stenosis. Sinuses/Orbits: Visualized orbits demonstrate no acute abnormality. No significant paranasal sinus disease or mastoid effusion. Findings in the impression were called by telephone at the time of interpretation on 02/22/2019 at 3:53 pm to provider MARK QUALE , who verbally acknowledged these results. IMPRESSION: 1. Motion degraded examination. 2. 6 mm acute infarct within the left cerebellum. 3. Apparent thin FLAIR hyperintense extra-axial collection overlying the right parietal lobe as well as abnormal FLAIR hyperintensity within the right parietal, occipital and temporal lobe sulci. Noncontrast head CT is recommended to exclude acute subdural/subarachnoid hemorrhage. Alternative etiologies for the abnormal FLAIR hyperintensity would include infectious or carcinomatosis meningitis. Consider post-contrast MR imaging. 4. 1.4 cm dural-based extra-axial mass overlying the left frontal lobe, likely meningioma, similar to minimally increased in size since prior MRI 04/09/2016. 5. Atrophy and chronic small vessel ischemic disease. Electronically Signed   By: Kellie Simmering   On: 02/22/2019 15:55   Mr Cervical Spine Wo Contrast  Result Date: 02/22/2019 CLINICAL DATA:  Headache and neck pain EXAM: MRI CERVICAL SPINE WITHOUT CONTRAST TECHNIQUE: Multiplanar, multisequence MR imaging of the cervical spine was performed. No intravenous contrast was administered. COMPARISON:  MRI cervical spine 02/04/2019 FINDINGS: Alignment: Mild  anterolisthesis C4-5, C5-6, C6-7. Motion degraded image quality. Vertebrae: Hemangioma C7 vertebral body. Negative for fracture or mass. Cord: Cord evaluation limited due to motion. No cord lesion identified. Posterior Fossa, vertebral arteries, paraspinal tissues: Negative Disc levels: C1-2: Advanced arthropathy. Prominent pannus formation posterior to the dens causing mild spinal stenosis. This is unchanged from the recent study. C2-3: Negative C3-4: Negative C4-5: Mild anterolisthesis. Disc and facet degeneration without significant stenosis C5-6: Anterolisthesis with disc and facet degeneration. Mild spinal and foraminal stenosis unchanged C6-7:  Mild anterolisthesis. Disc and facet degeneration without significant stenosis C7-T1: Negative IMPRESSION: Image quality degraded by extensive motion As best I can determine given the amount of motion, no change from the recent MRI cervical spine of 02/04/2019 Advanced arthropathy at C1-2 with prominent pannus posterior to the dens causing mild spinal stenosis. Electronically Signed   By: Franchot Gallo M.D.   On: 02/22/2019 14:43    EKG:   Orders placed or performed during the hospital encounter of 06/12/18  . EKG 12-Lead  . EKG 12-Lead    IMPRESSION AND PLAN:    85 year old female who presents to the emergency room with a headache and found to have an acute stroke.  1. 6 mm acute infarct within the left cerebellum Continue CVA work-up with 2D echocardiogram and carotid Doppler Aspirin 325 mg daily Check lipid panel Neurology consultation placed via epic PT, OT and speech consultation Further work-up pending above studies and neurology evaluation.  2.  Urinary tract infection: Start Keflex Follow-up and urine culture  3.  Essential hypertension: Allow permissive hypertension and therefore will not start medications for her blood pressure at this time. PRN labetalol ordered with parameters  4.  Cervical radiculitis/osteoarthritis: Patient  followed at pain clinic All the records are reviewed and case discussed with ED provider. Management plans discussed with the patient and she is in agreement  CODE STATUS: DNR  TOTAL TIME TAKING CARE OF THIS PATIENT: 41 minutes.    Bettey Costa M.D on 02/22/2019 at 5:46 PM  Between 7am to 6pm - Pager - [phone]  After 6pm go to [email or web address] - Proofreader  [facility]  Office  [phone]  CC: Primary care physician; [facility]

## 2019-02-22 NOTE — ED Provider Notes (Signed)
Discussed with radiologist critical MRI brain findings.  I have placed consultation to neurology Dr. Irish Elders.  Patient and her son are agreeable understanding of plan for further consultation and work-up including noncontrast head CT which radiology is recommending to help differentiate IV fluid collection possibly blood noted on MRI.  Full report not yet posted, anticipate neurology consultation.  Ongoing care signed to Dr. Jimmye Norman.  Patient understanding of plan for further consultation and imaging.  Reports just mild ongoing occipital-like headache, does not wish to have any pain medication.  Awake alert in no distress   Delman Kitten, MD 02/22/19 [phone]

## 2019-02-23 ENCOUNTER — Inpatient Hospital Stay: Payer: Medicare Other

## 2019-02-23 DIAGNOSIS — I639 Cerebral infarction, unspecified: Principal | ICD-10-CM

## 2019-02-23 LAB — LIPID PANEL
Cholesterol: 190 mg/dL (ref 0–200)
HDL: 42 mg/dL (ref >40)
LDL Cholesterol: 119 mg/dL — ABNORMAL HIGH (ref 0–99)
Total CHOL/HDL Ratio: 4.5 RATIO
Triglycerides: 143 mg/dL (ref <150)
VLDL: 29 mg/dL (ref 0–40)

## 2019-02-23 LAB — HEMOGLOBIN A1C
Hgb A1c MFr Bld: 5.5 % (ref 4.8–5.6)
Mean Plasma Glucose: 111.15 mg/dL

## 2019-02-23 LAB — SARS CORONAVIRUS 2 BY RT PCR (HOSPITAL ORDER, PERFORMED IN ~~LOC~~ HOSPITAL LAB): SARS Coronavirus 2: NEGATIVE

## 2019-02-23 MED ORDER — PANTOPRAZOLE SODIUM 40 MG PO TBEC
40.0000 mg | DELAYED_RELEASE_TABLET | Freq: Every day | ORAL | Status: DC
  Administered 2019-02-24: 40 mg via ORAL
  Filled 2019-02-23: qty 1

## 2019-02-23 MED ORDER — LACTULOSE 10 GM/15ML PO SOLN: 20.0000 g | ORAL | Status: DC | PRN

## 2019-02-23 MED ORDER — OXYCODONE HCL 5 MG PO TABS
5.0000 mg | ORAL_TABLET | Freq: Four times a day (QID) | ORAL | Status: DC | PRN
  Administered 2019-02-23 – 2019-02-24 (×2): 5 mg via ORAL
  Filled 2019-02-23 (×2): qty 1

## 2019-02-23 MED ORDER — SERTRALINE HCL 50 MG PO TABS
100.0000 mg | ORAL_TABLET | Freq: Every day | ORAL | Status: DC
  Administered 2019-02-24: 100 mg via ORAL
  Filled 2019-02-23: qty 2

## 2019-02-23 MED ORDER — GABAPENTIN 300 MG PO CAPS
300.0000 mg | ORAL_CAPSULE | Freq: Every day | ORAL | Status: DC
  Administered 2019-02-23: 300 mg via ORAL
  Filled 2019-02-23: qty 1

## 2019-02-23 NOTE — Evaluation (Signed)
Physical Therapy Evaluation Patient Details Name: Cindy Graham MRN: [id number] DOB: 08-Jan-1922 Today's Date: 02/23/2019   History of Present Illness  Pt is a 85 y/o F with PMHx: COPD, arthritis, HLD, and HTN with recent history neck pain. Pt presented from ALF following severe headache-reporting 10/10 pain and some dizziness. MRI revealed acute infacrt of cerebellum.  Clinical Impression  Pt is a 85 year old female who lives in an ALF and walks short household distances with a RW.  She receives assistance with bathing and has recently been working with PT to increase ambulatory distance.  Pt pleasant and ready to work with therapy, alert and oriented to self and situation.  Pt presented with generalized weakness and was only able to perform modified finger to nose test due to shoulder ROM deficits.  Pt reported cervical pain throughout evaluation but was able to manage a squat pivot transfer to chair with min A.  Pt also stood from bedside with unilateral hand held assist.  She demonstrated poor sitting and standing balance overall and cited fatigue.  Pt will continue to benefit from skilled PT with focus on strength, tolerance to activity, safe functional mobility and balance.    Follow Up Recommendations Outpatient PT;Supervision for mobility/OOB(ALF offers OP PT which pt has been receiving.)    Equipment Recommendations  None recommended by PT    Recommendations for Other Services       Precautions / Restrictions Precautions Precautions: None Restrictions Weight Bearing Restrictions: No Other Position/Activity Restrictions: activity as tolerated      Mobility  Bed Mobility Overal bed mobility: Needs Assistance Bed Mobility: Supine to Sit;Sit to Supine     Supine to sit: Min assist Sit to supine: Min assist   General bed mobility comments: HOB slightly elevated, mild use of bedrails slow to rise.  Transfers Overall transfer level: Needs assistance Equipment used: Rolling  walker (2 wheeled) Transfers: Public house manager;Sit to/from Stand Sit to Stand: Min assist   Squat pivot transfers: Min assist     General transfer comment: Requested to transfer without RW, able to place hand on arm of chair and  pivot to chair.  PT provided min A for safety.  Min A hand held assist provided to pt for STS prior to this.  Ambulation/Gait                Stairs            Wheelchair Mobility    Modified Rankin (Stroke Patients Only)       Balance Overall balance assessment: Needs assistance Sitting-balance support: Single extremity supported Sitting balance-Leahy Scale: Fair Sitting balance - Comments: Pt laid over on R side when attention directed away from pt for a moment.  Stated that she is very tired when asked if she can sit up on her own.       Standing balance comment: Able to stand with unilateral hand held assistance.                             Pertinent Vitals/Pain Pain Assessment: Faces Pain Score: 9  Faces Pain Scale: Hurts even more Pain Location: Cx spine which pt reports onset 1 week+ ago. Pain Descriptors / Indicators: Aching;Grimacing;Guarding Pain Intervention(s): Limited activity within patient's tolerance;Monitored during session    Home Living Family/patient expects to be discharged to:: Assisted living               Home Equipment:  Walker - 4 wheels;Wheelchair - manual      Prior Function Level of Independence: Needs assistance   Gait / Transfers Assistance Needed: Pt was ambulating with rollator with PT at ALF (2-3 months ago, independently), most recently with dining hall at ALF re-opening (closed d/t COVID precautions), pt has been relying on w/c for fxl mobility to dining hall. Can still use rollator within room, but fearful of falling with longer distances.  ADL's / Homemaking Assistance Needed: Pt was recieving aid help for bathing/shower transfer, stated she can't get in and out of a tub  on her own anymore. Pt able to perform dressing independently, ALF provides meals        Hand Dominance        Extremity/Trunk Assessment   Upper Extremity Assessment Upper Extremity Assessment: Overall WFL for tasks assessed;Generalized weakness;RUE deficits/detail;LUE deficits/detail RUE Deficits / Details: neck pain on R side, decreased tolerance for MMT, grossly >3/5, grip 4-/5, Bilat shoulder ROM limited: 70 degrees. LUE Deficits / Details: shoudler, elbow flex/ext 4-/5, grip 4-/5    Lower Extremity Assessment Lower Extremity Assessment: Generalized weakness(Ankle DF/PF, knee flex/ext, hip flexion: 4-/5 bilaterally.)    Cervical / Trunk Assessment Cervical / Trunk Assessment: Kyphotic  Communication   Communication: HOH  Cognition Arousal/Alertness: Awake/alert Behavior During Therapy: WFL for tasks assessed/performed Overall Cognitive Status: Within Functional Limits for tasks assessed                                 General Comments: some mild confusion re: meds as ALF has been helping for past year and some confusion of sequence of events leading up to hospitalization. Otherwuse follows commands appropriately and A&O x3      General Comments      Exercises Other Exercises Other Exercises: OT facilitates education re: general safety precautions/fall prevention-purpose for bed alarm, use of call bell for assistance. Pt verbalized understanding. Other Exercises: OT facilitates education re: purpose of OT and role of OT in this setting. Pt verbalized understanding. Other Exercises: OT facilitates education re: d/c recommendations including OT f/u at ALF, pt verbalized understanding.   Assessment/Plan    PT Assessment Patient needs continued PT services  PT Problem List Decreased strength;Decreased mobility;Decreased activity tolerance;Decreased balance;Decreased knowledge of use of DME       PT Treatment Interventions DME instruction;Therapeutic  activities;Gait training;Therapeutic exercise;Patient/family education;Stair training;Balance training;Functional mobility training    PT Goals (Current goals can be found in the Care Plan section)  Acute Rehab PT Goals Patient Stated Goal: To manage neck pain. PT Goal Formulation: With patient Time For Goal Achievement: 03/09/19 Potential to Achieve Goals: Good    Frequency 7X/week   Barriers to discharge        Co-evaluation               AM-PAC PT "6 Clicks" Mobility  Outcome Measure Help needed turning from your back to your side while in a flat bed without using bedrails?: A Little Help needed moving from lying on your back to sitting on the side of a flat bed without using bedrails?: A Little Help needed moving to and from a bed to a chair (including a wheelchair)?: A Little Help needed standing up from a chair using your arms (e.g., wheelchair or bedside chair)?: A Little Help needed to walk in hospital room?: A Lot Help needed climbing 3-5 steps with a railing? : A Lot 6 Click Score:  16    End of Session Equipment Utilized During Treatment: Gait belt Activity Tolerance: Patient limited by fatigue Patient left: in chair;with chair alarm set;with call bell/phone within reach Nurse Communication: Mobility status PT Visit Diagnosis: Unsteadiness on feet (R26.81);Muscle weakness (generalized) (M62.81);Difficulty in walking, not elsewhere classified (R26.2)    Time: [id number] PT Time Calculation (min) (ACUTE ONLY): 18 min   Charges:   PT Evaluation $PT Eval Low Complexity: 1 Low          Roxanne Gates, PT, DPT   Roxanne Gates 02/23/2019, 2:16 PM

## 2019-02-23 NOTE — Progress Notes (Signed)
SLP Cancellation Note  Patient Details Name: Cindy Graham MRN: [id number] DOB: Oct 17, 1921   Cancelled treatment:       Reason Eval/Treat Not Completed: SLP screened, no needs identified, will sign off(chart reviewed; consulted NSG then met w/ pt in room). Pt denied any difficulty swallowing and is currently on a regular diet; tolerated swallowing pills w/ water last night per NSG. She actually ate much of her breakfast meal in front of this SLP after given tray setup feeding herself.  Pt conversed at conversational level w/out deficits noted; pt and (Son on phone) did not endorse any new speech-language deficits. Pt wears a large upper Denture plate and No Bottom Denture plate which appears to impact her articulation minimally, but pt denied any change from "my usual talking". She spoke at length w/ her Son on the phone during this session -- appropriate conversation.  Pt is Mildly+ Hard of Hearing -- aids were not in.  No further skilled ST services indicated as pt appears at her baseline. Pt agreed. NSG to reconsult if any change in status while admitted.      Orinda Kenner, MS, CCC-SLP Watson,Katherine 02/23/2019, 10:10 AM

## 2019-02-23 NOTE — Evaluation (Addendum)
Occupational Therapy Evaluation Patient Details Name: Cindy Graham MRN: [id number] DOB: 06/17/1921 Today's Date: 02/23/2019    History of Present Illness Pt is a 85 y/o F with PMHx: COPD, arthritis, HLD, and HTN with recent history neck pain. Pt presented from ALF following severe headache-reporting 10/10 pain and some dizziness. MRI revealed acute infacrt of cerebellum.   Clinical Impression   Pt seen for OT evaluation this date. Prior to hospital admission, pt living in ALF, Indep with dressing, required aid assistance for bathing and shower transfer, was ambulating with facility PT with Rolator (following fall 3-4 weeks ago per pt report), able to use Rolator for short distance in room, and relied on w/c for further distances once COVID precautions rolled back and dining hall re-opened.  Currently pt demonstrates impairments in fxl activity tolerance, sitting balance 2/2 dizziness, decreased strength and balance requiring MIN A for bed mobility sup<>sit and politely declining to participate in transfers OOB this date citing significant neck pain, and decreased tolerance for LB dressing requiring MIN/MOD A.  Pt would benefit from skilled OT to address noted impairments and functional limitations (see below for any additional details) in order to maximize safety and independence while minimizing falls risk and caregiver burden. Upon hospital discharge, recommend pt discharge to her ALF with f/u outpt OT.    Follow Up Recommendations  Outpatient OT(at her [place]); Intermittent supervision for safety    Equipment Recommendations  None recommended by OT    Recommendations for Other Services       Precautions / Restrictions Precautions Precautions: None Restrictions Other Position/Activity Restrictions: activity as tolerated      Mobility Bed Mobility Overal bed mobility: Needs Assistance Bed Mobility: Supine to Sit;Sit to Supine     Supine to sit: Min assist Sit  to supine: Min assist   General bed mobility comments: HOB slightly elevated, mild use of bedrails.  Transfers                 General transfer comment: pt politely declines to participate in transfer on OT eval d/t c/o significant neck pain.    Balance Overall balance assessment: Needs assistance Sitting-balance support: Single extremity supported Sitting balance-Leahy Scale: Fair Sitting balance - Comments: very kyphotic posture. Pt demos no LOB with static sitting, mildly unsteady with more dynamic reaching and holds bed rail with R UE.       Standing balance comment: standing deferrred at time of OT eval secondary to pain and awaiting medication.                           ADL either performed or assessed with clinical judgement   ADL Overall ADL's : Needs assistance/impaired Eating/Feeding: Set up;Sitting   Grooming: Wash/dry hands;Wash/dry face;Set up;Sitting   Upper Body Bathing: Moderate assistance;Supervision/ safety;Sitting   Lower Body Bathing: Moderate assistance;Supervison/ safety;Sit to/from stand Lower Body Bathing Details (indicate cue type and reason): based on clinincal observation Upper Body Dressing : Set up;Sitting   Lower Body Dressing: Minimal assistance;Moderate assistance;Sitting/lateral leans;Supervision/safety   Toilet Transfer: Minimal assistance;Moderate assistance;BSC;Supervision/safety Toilet Transfer Details (indicate cue type and reason): based on clinical observation Toileting- Clothing Manipulation and Hygiene: Moderate assistance;Sit to/from stand;Supervision/safety Toileting - Clothing Manipulation Details (indicate cue type and reason): based on clinical observation Tub/ Shower Transfer: Minimal assistance;Moderate assistance;Grab bars;Shower Scientist, research (medical) Details (indicate cue type and reason): based on clinical observation Functional mobility during ADLs: (fxl mobility not assessed on OT eval  as pt with c/o  severe neck pain. Only agreeable to sup<>sit at time of eval. RN presented with pain meds toward end of session.)       Vision Baseline Vision/History: Wears glasses Wears Glasses: At all times Patient Visual Report: No change from baseline       Perception     Praxis      Pertinent Vitals/Pain Pain Assessment: 0-10 Pain Score: 9  Pain Location: L side of cervical spine region Pain Descriptors / Indicators: Aching;Discomfort;Tightness Pain Intervention(s): Limited activity within patient's tolerance;Monitored during session;Repositioned;Patient requesting pain meds-RN notified;RN gave pain meds during session     Hand Dominance     Extremity/Trunk Assessment Upper Extremity Assessment Upper Extremity Assessment: Overall WFL for tasks assessed;Generalized weakness;RUE deficits/detail;LUE deficits/detail RUE Deficits / Details: neck pain on R side, decreased tolerance for MMT, grossly >3/5, grip 4-/5 LUE Deficits / Details: shoudler, elbow flex/ext 4-/5, grip 4-/5   Lower Extremity Assessment Lower Extremity Assessment: Defer to PT evaluation;Overall [facility] for tasks assessed   Cervical / Trunk Assessment Cervical / Trunk Assessment: Kyphotic   Communication Communication Communication: No difficulties, HOH bilaterally ADDEDNDED 1427 02/23/2019   Cognition Arousal/Alertness: Awake/alert Behavior During Therapy: WFL for tasks assessed/performed Overall Cognitive Status: Within Functional Limits for tasks assessed                                 General Comments: some mild confusion re: meds as ALF has been helping for past year and some confusion of sequence of events leading up to hospitalization. Otherwuse follows commands appropriately and A&O x3   General Comments       Exercises Other Exercises Other Exercises: OT facilitates education re: general safety precautions/fall prevention-purpose for bed alarm, use of call bell for assistance. Pt verbalized  understanding. Other Exercises: OT facilitates education re: purpose of OT and role of OT in this setting. Pt verbalized understanding. Other Exercises: OT facilitates education re: d/c recommendations including OT f/u at ALF, pt verbalized understanding.   Shoulder Instructions      Home Living Family/patient expects to be discharged to:: Assisted living                             Home Equipment: Walker - 4 wheels;Wheelchair - manual          Prior Functioning/Environment Level of Independence: Needs assistance  Gait / Transfers Assistance Needed: Pt was ambulating with rollator with PT at ALF (2-3 months ago, independently), most recently with dining hall at ALF re-opening (closed d/t COVID precautions), pt has been relying on w/c for fxl mobility to dining hall. Can still use rollator within room, but fearful of falling with longer distances. ADL's / Homemaking Assistance Needed: Pt was recieving aid help for bathing/shower transfer. Pt able to perform dressing independently, ALF provides meals            OT Problem List: Decreased strength;Decreased activity tolerance;Impaired balance (sitting and/or standing);Pain      OT Treatment/Interventions:      OT Goals(Current goals can be found in the care plan section) Acute Rehab OT Goals Patient Stated Goal: For this pain to go away and be able to get back home. OT Goal Formulation: With patient Time For Goal Achievement: 03/09/19 Potential to Achieve Goals: Good  OT Frequency:     Barriers to D/C:  Co-evaluation              AM-PAC OT "6 Clicks" Daily Activity     Outcome Measure Help from another person eating meals?: None Help from another person taking care of personal grooming?: A Little Help from another person toileting, which includes using toliet, bedpan, or urinal?: A Little Help from another person bathing (including washing, rinsing, drying)?: A Lot Help from another person to  put on and taking off regular upper body clothing?: None Help from another person to put on and taking off regular lower body clothing?: A Little 6 Click Score: 19   End of Session Nurse Communication: Patient requests pain meds(communicated pt report of neck pain increasing even with HOB elevation, asking for [facility] to be lowered following eating breakfast.)  Activity Tolerance: Patient tolerated treatment well;Patient limited by pain Patient left: in bed;with call bell/phone within reach;with bed alarm set  OT Visit Diagnosis: History of falling (Z91.81);Muscle weakness (generalized) (M62.81)                Time: [id number] OT Time Calculation (min): 41 min Charges:  OT General Charges $OT Visit: 1 Visit OT Evaluation $OT Eval Moderate Complexity: 1 Mod OT Treatments $Self Care/Home Management : 8-22 mins $Therapeutic Activity: 8-22 mins  Gerrianne Scale, MS, OTR/L ascom [phone] or [phone] 02/23/19, 11:44 AM

## 2019-02-23 NOTE — Progress Notes (Signed)
[place] at [facility]                                                                                                                                                                                  Patient Demographics   Cindy Graham, is a 85 y.o. female, DOB - 23-Mar-1922, [id number]  Admit date - 02/22/2019   Admitting Physician Bettey Costa, MD  Outpatient Primary MD for the patient is [facility]   LOS - 1  Subjective: Patient complains of some pain in her neck and back of her head which appears to be chronic    Review of Systems:   CONSTITUTIONAL: No documented fever. No fatigue, weakness. No weight gain, no weight loss.  EYES: No blurry or double vision.  ENT: No tinnitus. No postnasal drip. No redness of the oropharynx.  RESPIRATORY: No cough, no wheeze, no hemoptysis. No dyspnea.  CARDIOVASCULAR: No chest pain. No orthopnea. No palpitations. No syncope.  GASTROINTESTINAL: No nausea, no vomiting or diarrhea. No abdominal pain. No melena or hematochezia.  GENITOURINARY: No dysuria or hematuria.  ENDOCRINE: No polyuria or nocturia. No heat or cold intolerance.  HEMATOLOGY: No anemia. No bruising. No bleeding.  INTEGUMENTARY: No rashes. No lesions.  MUSCULOSKELETAL: No arthritis. No swelling. No gout.  Positive neck pain back pain NEUROLOGIC: No numbness, tingling, or ataxia. No seizure-type activity.  PSYCHIATRIC: No anxiety. No insomnia. No ADD.    Vitals:   Vitals:   02/22/19 1914 02/23/19 0140 02/23/19 0318 02/23/19 0649  BP:  (!) 144/50 (!) 161/55 (!) 150/59  Pulse:  73 76 79  Resp:  16 16 20   Temp:    97.7 F (36.5 C)  TempSrc:      SpO2:  95% 92% 95%  Weight: 69.3 kg     Height:        Wt Readings from Last 3 Encounters:  02/22/19 69.3 kg  06/14/18 77.1 kg  02/11/18 77.1 kg     Intake/Output Summary (Last 24 hours) at 02/23/2019 1514 Last data filed at 02/23/2019 1304 Gross per 24 hour  Intake 240 ml   Output 850 ml  Net -610 ml    Physical Exam:   GENERAL: Pleasant-appearing in no apparent distress.  HEAD, EYES, EARS, NOSE AND THROAT: Atraumatic, normocephalic. Extraocular muscles are intact. Pupils equal and reactive to light. Sclerae anicteric. No conjunctival injection. No oro-pharyngeal erythema.  NECK: Supple. There is no jugular venous distention. No bruits, no lymphadenopathy, no thyromegaly.  HEART: Regular rate and rhythm,. No murmurs, no rubs, no clicks.  LUNGS: Clear to auscultation bilaterally. No rales or rhonchi. No wheezes.  ABDOMEN: Soft, flat, nontender, nondistended. Has  good bowel sounds. No hepatosplenomegaly appreciated.  EXTREMITIES: No evidence of any cyanosis, clubbing, or peripheral edema.  +2 pedal and radial pulses bilaterally.  NEUROLOGIC: The patient is alert, awake, and oriented x3 with no focal motor or sensory deficits appreciated bilaterally.  SKIN: Moist and warm with no rashes appreciated.  Psych: Not anxious, depressed LN: No inguinal LN enlargement    Antibiotics   Anti-infectives (From admission, onward)   Start     Dose/Rate Route Frequency Ordered Stop   02/22/19 2200  cephALEXin (KEFLEX) capsule 250 mg     250 mg Oral Every 12 hours 02/22/19 1755        Medications   Scheduled Meds: . aspirin EC  325 mg Oral Daily  . cephALEXin  250 mg Oral Q12H  . enoxaparin (LOVENOX) injection  30 mg Subcutaneous Q24H  . gabapentin  300 mg Oral QHS  . oxyCODONE  10 mg Oral Q12H  . pantoprazole  40 mg Oral Daily  . sertraline  100 mg Oral Daily   Continuous Infusions: PRN Meds:.acetaminophen **OR** acetaminophen (TYLENOL) oral liquid 160 mg/5 mL **OR** acetaminophen, labetalol, lactulose, oxyCODONE   Data Review:   Micro Results Recent Results (from the past 240 hour(s))  SARS Coronavirus 2 [facility] order, Performed in [facility] hospital lab) Nasopharyngeal Nasopharyngeal Swab     Status: None   Collection Time: 02/22/19  6:17 PM    Specimen: Nasopharyngeal Swab  Result Value Ref Range Status   SARS Coronavirus 2 NEGATIVE NEGATIVE Final    Comment: (NOTE) If result is NEGATIVE SARS-CoV-2 target nucleic acids are NOT DETECTED. The SARS-CoV-2 RNA is generally detectable in upper and lower  respiratory specimens during the acute phase of infection. The lowest  concentration of SARS-CoV-2 viral copies this assay can detect is 250  copies / mL. A negative result does not preclude SARS-CoV-2 infection  and should not be used as the sole basis for treatment or other  patient management decisions.  A negative result may occur with  improper specimen collection / handling, submission of specimen other  than nasopharyngeal swab, presence of viral mutation(s) within the  areas targeted by this assay, and inadequate number of viral copies  (<250 copies / mL). A negative result must be combined with clinical  observations, patient history, and epidemiological information. If result is POSITIVE SARS-CoV-2 target nucleic acids are DETECTED. The SARS-CoV-2 RNA is generally detectable in upper and lower  respiratory specimens dur ing the acute phase of infection.  Positive  results are indicative of active infection with SARS-CoV-2.  Clinical  correlation with patient history and other diagnostic information is  necessary to determine patient infection status.  Positive results do  not rule out bacterial infection or co-infection with other viruses. If result is PRESUMPTIVE POSTIVE SARS-CoV-2 nucleic acids MAY BE PRESENT.   A presumptive positive result was obtained on the submitted specimen  and confirmed on repeat testing.  While 2019 novel coronavirus  (SARS-CoV-2) nucleic acids may be present in the submitted sample  additional confirmatory testing may be necessary for epidemiological  and / or clinical management purposes  to differentiate between  SARS-CoV-2 and other Sarbecovirus currently known to infect humans.  If  clinically indicated additional testing with an alternate test  methodology [phone]) is advised. The SARS-CoV-2 RNA is generally  detectable in upper and lower respiratory sp ecimens during the acute  phase of infection. The expected result is Negative. Fact Sheet for Patients:  StrictlyIdeas.no Fact Sheet for Healthcare Providers:  BankingDealers.co.za This test is not yet approved or cleared by the [place] and has been authorized for detection and/or diagnosis of SARS-CoV-2 by FDA under an Emergency Use Authorization (EUA).  This EUA will remain in effect (meaning this test can be used) for the duration of the COVID-19 declaration under Section 564(b)(1) of the Act, 21 U.S.C. section 360bbb-3(b)(1), unless the authorization is terminated or revoked sooner. Performed at [facility], [address] [place], [place] [address]     Radiology Reports Ct Head Wo Contrast  Addendum Date: 02/22/2019   ADDENDUM REPORT: 02/22/2019 17:36 ADDENDUM: This examination has been compared with the cranial MRI done earlier today (not available at the time of initial interpretation). There is no evidence of subarachnoid or subdural hemorrhage. Electronically Signed   By: Richardean Sale M.D.   On: 02/22/2019 17:36   Result Date: 02/22/2019 CLINICAL DATA:  Acute headache starting last night. Mild photosensitivity. No acute injury. EXAM: CT HEAD WITHOUT CONTRAST TECHNIQUE: Contiguous axial images were obtained from the base of the skull through the vertex without intravenous contrast. COMPARISON:  CT head 11/03/2017.  MRI brain 04/09/2016. FINDINGS: Brain: There is no evidence of acute intracranial hemorrhage, mass lesion, brain edema or extra-axial fluid collection. Stable age-appropriate atrophy with mild prominence of the ventricles and subarachnoid spaces. There is no CT evidence of acute cortical infarction. There is a stable densely  calcified left frontal meningioma, measuring up to 1.4 cm in diameter. Vascular: Intracranial vascular calcifications. No hyperdense vessel identified. Skull: Stable exophytic right frontal osteoma. No acute calvarial findings. Sinuses/Orbits: The visualized paranasal sinuses and mastoid air cells are clear. No orbital abnormalities are seen. Other: None. IMPRESSION: 1. Stable examination without evidence of acute intracranial process. 2. Stable calcified left frontal meningioma. Electronically Signed: By: Richardean Sale M.D. On: 02/22/2019 16:21   Mr Brain Wo Contrast  Result Date: 02/22/2019 CLINICAL DATA:  Headache, acute, normal neuro exam. Additional history provided: Headache and neck pain since yesterday. EXAM: MRI HEAD WITHOUT CONTRAST TECHNIQUE: Multiplanar, multiecho pulse sequences of the brain and surrounding structures were obtained without intravenous contrast. COMPARISON:  Head CT 11/03/2017, brain MRI 04/09/2016 FINDINGS: Brain: Multiple sequences are motion degraded, limiting evaluation There is a 6 mm focus of restricted diffusion within the left cerebellum consistent with acute infarct. Corresponding T2/FLAIR hyperintensity at this site. Again demonstrated is a dural-based extra-axial mass overlying the left frontal convexity which measures 1.3 x 1.1 x 1.4 cm (AP x TV x CC) on the current examination. This is similar to minimally increased in size as compared to prior examination 04/09/2016, at which time measuring 1.3 x 1.1 x 1.3 cm. Minimal mass effect upon the underlying left frontal lobe. There is an apparent thin FLAIR hyperintense extra-axial collection overlying the right parietal lobe, as well as abnormal FLAIR hyperintensity within right parietal, occipital and temporal lobe sulci (for instance as seen on series 1033, image 41). Mild scattered T2/FLAIR hyperintensity within the cerebral white matter is nonspecific, but consistent with chronic small vessel ischemic disease. No midline  shift.  Mild generalized parenchymal atrophy. Vascular: Flow voids maintained within the proximal large arterial vessels. Skull and upper cervical spine: Incompletely assessed cervical spondylosis. This includes prominent pannus formation at the skull base and C1-C2 level with resultant mild craniocervical/spinal canal stenosis. Sinuses/Orbits: Visualized orbits demonstrate no acute abnormality. No significant paranasal sinus disease or mastoid effusion. Findings in the impression were called by telephone at the time of interpretation on 02/22/2019 at 3:53 pm to  provider Delman Kitten , who verbally acknowledged these results. IMPRESSION: 1. Motion degraded examination. 2. 6 mm acute infarct within the left cerebellum. 3. Apparent thin FLAIR hyperintense extra-axial collection overlying the right parietal lobe as well as abnormal FLAIR hyperintensity within the right parietal, occipital and temporal lobe sulci. Noncontrast head CT is recommended to exclude acute subdural/subarachnoid hemorrhage. Alternative etiologies for the abnormal FLAIR hyperintensity would include infectious or carcinomatosis meningitis. Consider post-contrast MR imaging. 4. 1.4 cm dural-based extra-axial mass overlying the left frontal lobe, likely meningioma, similar to minimally increased in size since prior MRI 04/09/2016. 5. Atrophy and chronic small vessel ischemic disease. Electronically Signed   By: Kellie Simmering   On: 02/22/2019 15:55   Mr Cervical Spine Wo Contrast  Result Date: 02/22/2019 CLINICAL DATA:  Headache and neck pain EXAM: MRI CERVICAL SPINE WITHOUT CONTRAST TECHNIQUE: Multiplanar, multisequence MR imaging of the cervical spine was performed. No intravenous contrast was administered. COMPARISON:  MRI cervical spine 02/04/2019 FINDINGS: Alignment: Mild anterolisthesis C4-5, C5-6, C6-7. Motion degraded image quality. Vertebrae: Hemangioma C7 vertebral body. Negative for fracture or mass. Cord: Cord evaluation limited due to  motion. No cord lesion identified. Posterior Fossa, vertebral arteries, paraspinal tissues: Negative Disc levels: C1-2: Advanced arthropathy. Prominent pannus formation posterior to the dens causing mild spinal stenosis. This is unchanged from the recent study. C2-3: Negative C3-4: Negative C4-5: Mild anterolisthesis. Disc and facet degeneration without significant stenosis C5-6: Anterolisthesis with disc and facet degeneration. Mild spinal and foraminal stenosis unchanged C6-7: Mild anterolisthesis. Disc and facet degeneration without significant stenosis C7-T1: Negative IMPRESSION: Image quality degraded by extensive motion As best I can determine given the amount of motion, no change from the recent MRI cervical spine of 02/04/2019 Advanced arthropathy at C1-2 with prominent pannus posterior to the dens causing mild spinal stenosis. Electronically Signed   By: Franchot Gallo M.D.   On: 02/22/2019 14:43   Mr Cervical Spine Wo Contrast  Result Date: 02/04/2019 CLINICAL DATA:  A chronic neck pain since fall 2 months ago. EXAM: MRI CERVICAL SPINE WITHOUT CONTRAST TECHNIQUE: Multiplanar, multisequence MR imaging of the cervical spine was performed. No intravenous contrast was administered. COMPARISON:  CT cervical spine dated November 03, 2017. FINDINGS: Alignment: Unchanged 1-2 mm anterolisthesis at C4-C5, C5-C6, and C6-C7. Vertebrae: No fracture, evidence of discitis, or bone lesion. 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 Ligament hypertrophy posterior to the dens results in mild spinal canal stenosis at C1-C2. Cord: Normal signal and morphology. Posterior Fossa, vertebral arteries, paraspinal tissues: Negative. Disc levels: C2-C3: Negative disc. Bilateral facet joint ankylosis. No stenosis. C3-C4: Negative disc. Bilateral facet joint ankylosis. No stenosis. C4-C5: Disc uncovering with tiny central protrusion. Moderate to severe bilateral facet arthropathy. Moderate left neuroforaminal stenosis. No spinal canal  or right neuroforaminal stenosis. C5-C6: Mild disc bulging effacing the ventral thecal sac. Moderate to severe bilateral facet arthropathy. No stenosis. C6-C7: Disc uncovering with small right paracentral protrusion. Moderate to severe bilateral facet arthropathy. No stenosis. C7-T1: Negative disc. Moderate to severe bilateral facet arthropathy. No stenosis. No significant degenerative disc disease or stenosis in the visualized upper thoracic spine. IMPRESSION: 1. 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 Ligamentum hypertrophy posterior to the dens results in mild spinal canal stenosis at C1-C2. 2. Mild degenerative disc disease and moderate to severe facet arthropathy from C4-C5 through C7-T1. Moderate left neuroforaminal stenosis at C4-C5. Electronically Signed   By: Titus Dubin M.D.   On: 02/04/2019 11:03   US Carotid Bilateral (at [facility] Only)  Result  Date: 02/23/2019 CLINICAL DATA:  85 year old female with a history stroke EXAM: BILATERAL CAROTID DUPLEX ULTRASOUND TECHNIQUE: Pearline Cables scale imaging, color Doppler and duplex ultrasound were performed of bilateral carotid and vertebral arteries in the neck. COMPARISON:  None. FINDINGS: Criteria: Quantification of carotid stenosis is based on velocity parameters that correlate the residual internal carotid diameter with NASCET-based stenosis levels, using the diameter of the distal internal carotid lumen as the denominator for stenosis measurement. The following velocity measurements were obtained: RIGHT ICA:  Systolic 90 cm/sec, Diastolic 18 cm/sec CCA:  96 cm/sec SYSTOLIC ICA/CCA RATIO:  0.9 ECA:  111 cm/sec LEFT ICA:  Systolic 79 cm/sec, Diastolic 15 cm/sec CCA:  67 cm/sec SYSTOLIC ICA/CCA RATIO:  1.2 ECA:  100 cm/sec Right Brachial SBP: Not acquired Left Brachial SBP: Not acquired RIGHT CAROTID ARTERY: No significant calcifications of the right common carotid artery. Intermediate waveform maintained. Heterogeneous and partially calcified plaque at  the right carotid bifurcation. No significant lumen shadowing. Low resistance waveform of the right ICA. No significant tortuosity. RIGHT VERTEBRAL ARTERY: Antegrade flow with low resistance waveform. LEFT CAROTID ARTERY: No significant calcifications of the left common carotid artery. Intermediate waveform maintained. Heterogeneous and partially calcified plaque at the left carotid bifurcation without significant lumen shadowing. Low resistance waveform of the left ICA. No significant tortuosity. LEFT VERTEBRAL ARTERY:  Antegrade flow with low resistance waveform. IMPRESSION: Color duplex indicates minimal heterogeneous and calcified plaque, with no hemodynamically significant stenosis by duplex criteria in the extracranial cerebrovascular circulation. Signed, Dulcy Fanny. Dellia Nims, RPVI [facility] Radiology Electronically Signed   By: Corrie Mckusick D.O.   On: 02/23/2019 09:07     CBC Recent Labs  Lab 02/22/19 1624  WBC 8.4  HGB 13.2  HCT 41.4  PLT 283  MCV 97.6  MCH 31.1  MCHC 31.9  RDW 13.8  LYMPHSABS 2.9  MONOABS 0.6  EOSABS 0.4  BASOSABS 0.1    Chemistries  Recent Labs  Lab 02/22/19 1624  NA 135  K 4.0  CL 98  CO2 28  GLUCOSE 113*  BUN 26*  CREATININE 1.24*  CALCIUM 9.4   ------------------------------------------------------------------------------------------------------------------ estimated creatinine clearance is 25.4 mL/min (A) (by C-G formula based on SCr of 1.24 mg/dL (H)). ------------------------------------------------------------------------------------------------------------------ No results for input(s): HGBA1C in the last 72 hours. ------------------------------------------------------------------------------------------------------------------ Recent Labs    02/23/19 0618  CHOL 190  HDL 42  LDLCALC 119*  TRIG 143  CHOLHDL 4.5    ------------------------------------------------------------------------------------------------------------------ No results for input(s): TSH, T4TOTAL, T3FREE, THYROIDAB in the last 72 hours.  Invalid input(s): FREET3 ------------------------------------------------------------------------------------------------------------------ No results for input(s): VITAMINB12, FOLATE, FERRITIN, TIBC, IRON, RETICCTPCT in the last 72 hours.  Coagulation profile Recent Labs  Lab 02/22/19 1624  INR 1.0    No results for input(s): DDIMER in the last 72 hours.  Cardiac Enzymes No results for input(s): CKMB, TROPONINI, MYOGLOBIN in the last 168 hours.  Invalid input(s): CK ------------------------------------------------------------------------------------------------------------------ Invalid input(s): POCBNP    Assessment & Plan   85 year old female who presents to the emergency room with a headache and found to have an acute stroke.  1. 6 mm acute infarct within the left cerebellum Continue aspirin 325 daily Carotid Doppler with plaque Echo pending PT evaluation pending  2.  Urinary tract infection:  Urine cultures pending Continue Keflex  3.  Essential hypertension:  Blood pressure use PRN labetalol   4.  Cervical radiculitis/osteoarthritis: Patient followed at pain clinic Resume Neurontin other home medications for pain    All the records  are reviewed and case discussed with ED provider. Management plans discussed with the patient and she is in agreement      Code Status Orders  (From admission, onward)         Start     Ordered   02/22/19 1914  Do not attempt resuscitation (DNR)  Continuous    Question Answer Comment  In the event of cardiac or respiratory ARREST Do not call a "code blue"   In the event of cardiac or respiratory ARREST Do not perform Intubation, CPR, defibrillation or ACLS   In the event of cardiac or respiratory ARREST Use medication by  any route, position, wound care, and other measures to relive pain and suffering. May use oxygen, suction and manual treatment of airway obstruction as needed for comfort.      02/22/19 1913        Code Status History    Date Active Date Inactive Code Status Order ID Comments User Context   06/12/2018 1827 06/16/2018 1506 DNR [id number]  [name], MD Inpatient   Advance Care Planning Activity           Consults  neuro  DVT Prophylaxis  Lovenox    Lab Results  Component Value Date   PLT 283 02/22/2019     Time Spent in minutes   59min  Greater than 50% of time spent in care coordination and counseling patient regarding the condition and plan of care.   Dustin Flock M.D on 02/23/2019 at 3:14 PM  Between 7am to 6pm - Pager - [phone]  After 6pm go to [email or web address] - Proofreader  [employer]   Office  [phone]

## 2019-02-23 NOTE — Consult Note (Signed)
Reason for Consult: headache and cerebellar stroke Referring Physician: Dr. Benjie Karvonen   CC: cerebellar stroke   HPI: Cindy Graham is an 85 y.o. female  with a known history of neck pain seen by pain clinic on [place] and COPD who presents to the emergency room due to headache.  Patient reports that last night she had a 10 out of 10 severe headache. Symptoms resolved and she is back to baseline.  Patient has a small L cerebellar ischemic stroke.   Past Medical History:  Diagnosis Date  . Arthritis    osteo  . COPD (chronic obstructive pulmonary disease) ([place])   . GERD (gastroesophageal reflux disease)   . Heart murmur   . Hernia   . Hyperlipidemia   . Hypertension   . Melanoma ([place])    face  . Syncope and collapse   . Urine incontinence     Past Surgical History:  Procedure Laterality Date  . ABDOMINAL HYSTERECTOMY    . AMPUTATION TOE Right 06/14/2018   Procedure: AMPUTATION TOE - RIGHT 5TH;  Surgeon: Albertine Patricia, DPM;  Location: [facility] ORS;  Service: Podiatry;  Laterality: Right;  . CHOLECYSTECTOMY    . HERNIA REPAIR      Family History  Problem Relation Age of Onset  . Stroke Father   . Heart disease Sister   . Cancer Brother        colon  . Heart disease Brother   . Cancer Sister        colon  . Heart attack Brother   . Heart attack Son   . Parkinson's disease Son     Social History:  reports that she has never smoked. She has never used smokeless tobacco. She reports that she does not drink alcohol or use drugs.  Allergies  Allergen Reactions  . Statins Other (See Comments)    Very bad muscle aches  . Tramadol Itching  . Levaquin [Levofloxacin In D5w] Diarrhea and Other (See Comments)    Confusion, hallucinations  . Neomycin-Polymyxin-Gramicidin Other (See Comments)  . Penicillins     Did it involve swelling of the face/tongue/throat, SOB, or low BP? Unknown Did it involve sudden or severe rash/hives, skin peeling, or any reaction on the inside of your  mouth or nose? Unknown Did you need to seek medical attention at a hospital or doctor's office? Unknown When did it last happen?Unknown If all above answers are "NO", may proceed with cephalosporin use.  . Sulfa Antibiotics   . Tetracyclines & Related     Medications: I have reviewed the patient's current medications.  ROS: History obtained from the patient  General ROS: negative for - chills, fatigue, fever, night sweats, weight gain or weight loss Psychological ROS: negative for - behavioral disorder, hallucinations, memory difficulties, mood swings or suicidal ideation Ophthalmic ROS: negative for - blurry vision, double vision, eye pain or loss of vision ENT ROS: negative for - epistaxis, nasal discharge, oral lesions, sore throat, tinnitus or vertigo Allergy and Immunology ROS: negative for - hives or itchy/watery eyes Hematological and Lymphatic ROS: negative for - bleeding problems, bruising or swollen lymph nodes Endocrine ROS: negative for - galactorrhea, hair pattern changes, polydipsia/polyuria or temperature intolerance Respiratory ROS: negative for - cough, hemoptysis, shortness of breath or wheezing Cardiovascular ROS: negative for - chest pain, dyspnea on exertion, edema or irregular heartbeat Gastrointestinal ROS: negative for - abdominal pain, diarrhea, hematemesis, nausea/vomiting or stool incontinence Genito-Urinary ROS: negative for - dysuria, hematuria, incontinence or urinary frequency/urgency Musculoskeletal  ROS: negative for - joint swelling or muscular weakness Neurological ROS: as noted in HPI Dermatological ROS: negative for rash and skin lesion changes  Physical Examination: Blood pressure (!) 150/59, pulse 79, temperature 97.7 F (36.5 C), resp. rate 20, height 5\' 3"  (1.6 m), weight 69.3 kg, SpO2 95 %.  Neurological Examination   Mental Status: Alert, oriented, thought content appropriate.  Speech fluent without evidence of aphasia.  Able to follow 3  step commands without difficulty. Cranial Nerves: II: Discs flat bilaterally; Visual fields grossly normal, pupils equal, round, reactive to light and accommodation III,IV, VI: ptosis not present, extra-ocular motions intact bilaterally V,VII: smile symmetric, facial light touch sensation normal bilaterally VIII: hearing normal bilaterally IX,X: gag reflex present XI: bilateral shoulder shrug XII: midline tongue extension Motor: Right : Upper extremity   5/5    Left:     Upper extremity   5/5  Lower extremity   5/5     Lower extremity   5/5 Tone and bulk:normal tone throughout; no atrophy noted Sensory: Pinprick and light touch intact throughout, bilaterally Deep Tendon Reflexes: 1+ and symmetric throughout Plantars: Right: downgoing   Left: downgoing Cerebellar: Mild dysmetria LUE finger to nose Gait: normal gait and station      Laboratory Studies:   Basic Metabolic Panel: Recent Labs  Lab 02/22/19 1624  NA 135  K 4.0  CL 98  CO2 28  GLUCOSE 113*  BUN 26*  CREATININE 1.24*  CALCIUM 9.4    Liver Function Tests: No results for input(s): AST, ALT, ALKPHOS, BILITOT, PROT, ALBUMIN in the last 168 hours. No results for input(s): LIPASE, AMYLASE in the last 168 hours. No results for input(s): AMMONIA in the last 168 hours.  CBC: Recent Labs  Lab 02/22/19 1624  WBC 8.4  NEUTROABS 4.3  HGB 13.2  HCT 41.4  MCV 97.6  PLT 283    Cardiac Enzymes: No results for input(s): CKTOTAL, CKMB, CKMBINDEX, TROPONINI in the last 168 hours.  BNP: Invalid input(s): POCBNP  CBG: No results for input(s): GLUCAP in the last 168 hours.  Microbiology: Results for orders placed or performed during the hospital encounter of 02/22/19  SARS Coronavirus 2 [facility] order, Performed in [facility] hospital lab) Nasopharyngeal Nasopharyngeal Swab     Status: None   Collection Time: 02/22/19  6:17 PM   Specimen: Nasopharyngeal Swab  Result Value Ref Range Status   SARS Coronavirus 2  NEGATIVE NEGATIVE Final    Comment: (NOTE) If result is NEGATIVE SARS-CoV-2 target nucleic acids are NOT DETECTED. The SARS-CoV-2 RNA is generally detectable in upper and lower  respiratory specimens during the acute phase of infection. The lowest  concentration of SARS-CoV-2 viral copies this assay can detect is 250  copies / mL. A negative result does not preclude SARS-CoV-2 infection  and should not be used as the sole basis for treatment or other  patient management decisions.  A negative result may occur with  improper specimen collection / handling, submission of specimen other  than nasopharyngeal swab, presence of viral mutation(s) within the  areas targeted by this assay, and inadequate number of viral copies  (<250 copies / mL). A negative result must be combined with clinical  observations, patient history, and epidemiological information. If result is POSITIVE SARS-CoV-2 target nucleic acids are DETECTED. The SARS-CoV-2 RNA is generally detectable in upper and lower  respiratory specimens dur ing the acute phase of infection.  Positive  results are indicative of active infection with SARS-CoV-2.  Clinical  correlation with patient history and other diagnostic information is  necessary to determine patient infection status.  Positive results do  not rule out bacterial infection or co-infection with other viruses. If result is PRESUMPTIVE POSTIVE SARS-CoV-2 nucleic acids MAY BE PRESENT.   A presumptive positive result was obtained on the submitted specimen  and confirmed on repeat testing.  While 2019 novel coronavirus  (SARS-CoV-2) nucleic acids may be present in the submitted sample  additional confirmatory testing may be necessary for epidemiological  and / or clinical management purposes  to differentiate between  SARS-CoV-2 and other Sarbecovirus currently known to infect humans.  If clinically indicated additional testing with an alternate test  methodology [phone])  is advised. The SARS-CoV-2 RNA is generally  detectable in upper and lower respiratory sp ecimens during the acute  phase of infection. The expected result is Negative. Fact Sheet for Patients:  StrictlyIdeas.no Fact Sheet for Healthcare Providers: BankingDealers.co.za This test is not yet approved or cleared by the [place] FDA and has been authorized for detection and/or diagnosis of SARS-CoV-2 by FDA under an Emergency Use Authorization (EUA).  This EUA will remain in effect (meaning this test can be used) for the duration of the COVID-19 declaration under Section 564(b)(1) of the Act, 21 U.S.C. section 360bbb-3(b)(1), unless the authorization is terminated or revoked sooner. Performed at [facility], [address] [place], [place] [address]     Coagulation Studies: Recent Labs    02/22/19 1624  LABPROT 13.0  INR 1.0    Urinalysis:  Recent Labs  Lab 02/22/19 1445  COLORURINE YELLOW*  LABSPEC 1.009  PHURINE 6.0  GLUCOSEU NEGATIVE  HGBUR SMALL*  BILIRUBINUR NEGATIVE  KETONESUR NEGATIVE  PROTEINUR NEGATIVE  NITRITE NEGATIVE  LEUKOCYTESUR LARGE*    Lipid Panel:     Component Value Date/Time   CHOL 190 02/23/2019 0618   CHOL 238 (H) 03/15/2013 0124   TRIG 143 02/23/2019 0618   TRIG 160 03/15/2013 0124   HDL 42 02/23/2019 0618   HDL 56 03/15/2013 0124   CHOLHDL 4.5 02/23/2019 0618   VLDL 29 02/23/2019 0618   VLDL 32 03/15/2013 0124   LDLCALC 119 (H) 02/23/2019 0618   LDLCALC 150 (H) 03/15/2013 0124    HgbA1C:  Lab Results  Component Value Date   HGBA1C 6.0 09/05/2017    Urine Drug Screen:  No results found for: LABOPIA, COCAINSCRNUR, LABBENZ, AMPHETMU, THCU, LABBARB  Alcohol Level: No results for input(s): ETH in the last 168 hours.  Other results: EKG: normal EKG, normal sinus rhythm, unchanged from previous tracings.  Imaging: Ct Head Wo Contrast  Addendum Date: 02/22/2019    ADDENDUM REPORT: 02/22/2019 17:36 ADDENDUM: This examination has been compared with the cranial MRI done earlier today (not available at the time of initial interpretation). There is no evidence of subarachnoid or subdural hemorrhage. Electronically Signed   By: Richardean Sale M.D.   On: 02/22/2019 17:36   Result Date: 02/22/2019 CLINICAL DATA:  Acute headache starting last night. Mild photosensitivity. No acute injury. EXAM: CT HEAD WITHOUT CONTRAST TECHNIQUE: Contiguous axial images were obtained from the base of the skull through the vertex without intravenous contrast. COMPARISON:  CT head 11/03/2017.  MRI brain 04/09/2016. FINDINGS: Brain: There is no evidence of acute intracranial hemorrhage, mass lesion, brain edema or extra-axial fluid collection. Stable age-appropriate atrophy with mild prominence of the ventricles and subarachnoid spaces. There is no CT evidence of acute cortical infarction. There is a stable densely calcified left frontal meningioma,  measuring up to 1.4 cm in diameter. Vascular: Intracranial vascular calcifications. No hyperdense vessel identified. Skull: Stable exophytic right frontal osteoma. No acute calvarial findings. Sinuses/Orbits: The visualized paranasal sinuses and mastoid air cells are clear. No orbital abnormalities are seen. Other: None. IMPRESSION: 1. Stable examination without evidence of acute intracranial process. 2. Stable calcified left frontal meningioma. Electronically Signed: By: Richardean Sale M.D. On: 02/22/2019 16:21   Mr Brain Wo Contrast  Result Date: 02/22/2019 CLINICAL DATA:  Headache, acute, normal neuro exam. Additional history provided: Headache and neck pain since yesterday. EXAM: MRI HEAD WITHOUT CONTRAST TECHNIQUE: Multiplanar, multiecho pulse sequences of the brain and surrounding structures were obtained without intravenous contrast. COMPARISON:  Head CT 11/03/2017, brain MRI 04/09/2016 FINDINGS: Brain: Multiple sequences are motion degraded,  limiting evaluation There is a 6 mm focus of restricted diffusion within the left cerebellum consistent with acute infarct. Corresponding T2/FLAIR hyperintensity at this site. Again demonstrated is a dural-based extra-axial mass overlying the left frontal convexity which measures 1.3 x 1.1 x 1.4 cm (AP x TV x CC) on the current examination. This is similar to minimally increased in size as compared to prior examination 04/09/2016, at which time measuring 1.3 x 1.1 x 1.3 cm. Minimal mass effect upon the underlying left frontal lobe. There is an apparent thin FLAIR hyperintense extra-axial collection overlying the right parietal lobe, as well as abnormal FLAIR hyperintensity within right parietal, occipital and temporal lobe sulci (for instance as seen on series 1033, image 41). Mild scattered T2/FLAIR hyperintensity within the cerebral white matter is nonspecific, but consistent with chronic small vessel ischemic disease. No midline shift.  Mild generalized parenchymal atrophy. Vascular: Flow voids maintained within the proximal large arterial vessels. Skull and upper cervical spine: Incompletely assessed cervical spondylosis. This includes prominent pannus formation at the skull base and C1-C2 level with resultant mild craniocervical/spinal canal stenosis. Sinuses/Orbits: Visualized orbits demonstrate no acute abnormality. No significant paranasal sinus disease or mastoid effusion. Findings in the impression were called by telephone at the time of interpretation on 02/22/2019 at 3:53 pm to provider MARK QUALE , who verbally acknowledged these results. IMPRESSION: 1. Motion degraded examination. 2. 6 mm acute infarct within the left cerebellum. 3. Apparent thin FLAIR hyperintense extra-axial collection overlying the right parietal lobe as well as abnormal FLAIR hyperintensity within the right parietal, occipital and temporal lobe sulci. Noncontrast head CT is recommended to exclude acute subdural/subarachnoid  hemorrhage. Alternative etiologies for the abnormal FLAIR hyperintensity would include infectious or carcinomatosis meningitis. Consider post-contrast MR imaging. 4. 1.4 cm dural-based extra-axial mass overlying the left frontal lobe, likely meningioma, similar to minimally increased in size since prior MRI 04/09/2016. 5. Atrophy and chronic small vessel ischemic disease. Electronically Signed   By: Kellie Simmering   On: 02/22/2019 15:55   Mr Cervical Spine Wo Contrast  Result Date: 02/22/2019 CLINICAL DATA:  Headache and neck pain EXAM: MRI CERVICAL SPINE WITHOUT CONTRAST TECHNIQUE: Multiplanar, multisequence MR imaging of the cervical spine was performed. No intravenous contrast was administered. COMPARISON:  MRI cervical spine 02/04/2019 FINDINGS: Alignment: Mild anterolisthesis C4-5, C5-6, C6-7. Motion degraded image quality. Vertebrae: Hemangioma C7 vertebral body. Negative for fracture or mass. Cord: Cord evaluation limited due to motion. No cord lesion identified. Posterior Fossa, vertebral arteries, paraspinal tissues: Negative Disc levels: C1-2: Advanced arthropathy. Prominent pannus formation posterior to the dens causing mild spinal stenosis. This is unchanged from the recent study. C2-3: Negative C3-4: Negative C4-5: Mild anterolisthesis. Disc and facet degeneration without significant stenosis C5-6:  Anterolisthesis with disc and facet degeneration. Mild spinal and foraminal stenosis unchanged C6-7: Mild anterolisthesis. Disc and facet degeneration without significant stenosis C7-T1: Negative IMPRESSION: Image quality degraded by extensive motion As best I can determine given the amount of motion, no change from the recent MRI cervical spine of 02/04/2019 Advanced arthropathy at C1-2 with prominent pannus posterior to the dens causing mild spinal stenosis. Electronically Signed   By: Franchot Gallo M.D.   On: 02/22/2019 14:43   US Carotid Bilateral (at [facility] Only)  Result Date:  02/23/2019 CLINICAL DATA:  85 year old female with a history stroke EXAM: BILATERAL CAROTID DUPLEX ULTRASOUND TECHNIQUE: Pearline Cables scale imaging, color Doppler and duplex ultrasound were performed of bilateral carotid and vertebral arteries in the neck. COMPARISON:  None. FINDINGS: Criteria: Quantification of carotid stenosis is based on velocity parameters that correlate the residual internal carotid diameter with NASCET-based stenosis levels, using the diameter of the distal internal carotid lumen as the denominator for stenosis measurement. The following velocity measurements were obtained: RIGHT ICA:  Systolic 90 cm/sec, Diastolic 18 cm/sec CCA:  96 cm/sec SYSTOLIC ICA/CCA RATIO:  0.9 ECA:  111 cm/sec LEFT ICA:  Systolic 79 cm/sec, Diastolic 15 cm/sec CCA:  67 cm/sec SYSTOLIC ICA/CCA RATIO:  1.2 ECA:  100 cm/sec Right Brachial SBP: Not acquired Left Brachial SBP: Not acquired RIGHT CAROTID ARTERY: No significant calcifications of the right common carotid artery. Intermediate waveform maintained. Heterogeneous and partially calcified plaque at the right carotid bifurcation. No significant lumen shadowing. Low resistance waveform of the right ICA. No significant tortuosity. RIGHT VERTEBRAL ARTERY: Antegrade flow with low resistance waveform. LEFT CAROTID ARTERY: No significant calcifications of the left common carotid artery. Intermediate waveform maintained. Heterogeneous and partially calcified plaque at the left carotid bifurcation without significant lumen shadowing. Low resistance waveform of the left ICA. No significant tortuosity. LEFT VERTEBRAL ARTERY:  Antegrade flow with low resistance waveform. IMPRESSION: Color duplex indicates minimal heterogeneous and calcified plaque, with no hemodynamically significant stenosis by duplex criteria in the extracranial cerebrovascular circulation. Signed, Dulcy Fanny. Dellia Nims, RPVI [facility] Electronically Signed    By: Corrie Mckusick D.O.   On: 02/23/2019 09:07     Assessment/Plan:  85 y.o. female  with a known history of neck pain seen by pain clinic on [place] and COPD who presents to the emergency room due to headache.  Patient reports that last night she had a 10 out of 10 severe headache. Symptoms resolved and she is back to baseline.  Patient has a small L cerebellar ischemic stroke.   - Was on ASA 81 mg at home. Increase to AS 325 daily and possibly d/c home if cleared by PT/OT - small vessel disease stroke - Stroke is posterior circulation related but given the age and back to baseline would not perseus a CTA - Carotid US no hemodynamic stenosis.  02/23/2019, 12:22 PM

## 2019-02-23 NOTE — Plan of Care (Signed)

## 2019-02-23 NOTE — Plan of Care (Signed)
  Problem: Education: Goal: Knowledge of General Education information will improve Description: Including pain rating scale, medication(s)/side effects and non-pharmacologic comfort measures 02/23/2019 0752 by Herbie Baltimore, RN Outcome: Progressing 02/23/2019 0715 by Herbie Baltimore, RN Outcome: Progressing   Problem: Health Behavior/Discharge Planning: Goal: Ability to manage health-related needs will improve 02/23/2019 0752 by Herbie Baltimore, RN Outcome: Progressing 02/23/2019 0715 by Herbie Baltimore, RN Outcome: Progressing   Problem: Clinical Measurements: Goal: Ability to maintain clinical measurements within normal limits will improve 02/23/2019 0752 by Herbie Baltimore, RN Outcome: Progressing 02/23/2019 0715 by Herbie Baltimore, RN Outcome: Progressing Goal: Will remain free from infection 02/23/2019 0752 by Herbie Baltimore, RN Outcome: Progressing 02/23/2019 0715 by Herbie Baltimore, RN Outcome: Progressing Goal: Diagnostic test results will improve 02/23/2019 0752 by Herbie Baltimore, RN Outcome: Progressing 02/23/2019 0715 by Herbie Baltimore, RN Outcome: Progressing Goal: Respiratory complications will improve 02/23/2019 0752 by Herbie Baltimore, RN Outcome: Progressing 02/23/2019 0715 by Herbie Baltimore, RN Outcome: Progressing Goal: Cardiovascular complication will be avoided 02/23/2019 0752 by Herbie Baltimore, RN Outcome: Progressing 02/23/2019 0715 by Herbie Baltimore, RN Outcome: Progressing   Problem: Activity: Goal: Risk for activity intolerance will decrease 02/23/2019 0752 by Herbie Baltimore, RN Outcome: Progressing 02/23/2019 0715 by Herbie Baltimore, RN Outcome: Progressing   Problem: Nutrition: Goal: Adequate nutrition will be maintained 02/23/2019 0752 by Herbie Baltimore, RN Outcome: Progressing 02/23/2019 0715 by Herbie Baltimore, RN Outcome: Progressing   Problem:  Elimination: Goal: Will not experience complications related to bowel motility 02/23/2019 0752 by Herbie Baltimore, RN Outcome: Progressing 02/23/2019 0715 by Herbie Baltimore, RN Outcome: Progressing Goal: Will not experience complications related to urinary retention 02/23/2019 0752 by Herbie Baltimore, RN Outcome: Progressing 02/23/2019 0715 by Herbie Baltimore, RN Outcome: Progressing   Problem: Pain Managment: Goal: General experience of comfort will improve 02/23/2019 0752 by Herbie Baltimore, RN Outcome: Progressing 02/23/2019 0715 by Herbie Baltimore, RN Outcome: Progressing   Problem: Safety: Goal: Ability to remain free from injury will improve 02/23/2019 0752 by Herbie Baltimore, RN Outcome: Progressing 02/23/2019 0715 by Herbie Baltimore, RN Outcome: Progressing   Problem: Skin Integrity: Goal: Risk for impaired skin integrity will decrease 02/23/2019 0752 by Herbie Baltimore, RN Outcome: Progressing 02/23/2019 0715 by Herbie Baltimore, RN Outcome: Progressing   Problem: Education: Goal: Knowledge of disease or condition will improve Outcome: Progressing Goal: Knowledge of secondary prevention will improve Outcome: Progressing Goal: Knowledge of patient specific risk factors addressed and post discharge goals established will improve Outcome: Progressing   Problem: Coping: Goal: Will verbalize positive feelings about self Outcome: Progressing Goal: Will identify appropriate support needs Outcome: Progressing   Problem: Health Behavior/Discharge Planning: Goal: Ability to manage health-related needs will improve Outcome: Progressing   Problem: Self-Care: Goal: Ability to participate in self-care as condition permits will improve Outcome: Progressing Goal: Verbalization of feelings and concerns over difficulty with self-care will improve Outcome: Progressing Goal: Ability to communicate needs accurately will  improve Outcome: Progressing   Problem: Nutrition: Goal: Risk of aspiration will decrease Outcome: Progressing   Problem: Ischemic Stroke/TIA Tissue Perfusion: Goal: Complications of ischemic stroke/TIA will be minimized Outcome: Progressing

## 2019-02-24 ENCOUNTER — Inpatient Hospital Stay
Admit: 2019-02-24 | Discharge: 2019-02-24 | Disposition: A | Discharge status: Home / Self Care | Payer: Medicare Other | Attending: Internal Medicine | Admitting: Internal Medicine

## 2019-02-24 LAB — ECHOCARDIOGRAM COMPLETE
Height: 63 in
Weight: 2444.46 oz

## 2019-02-24 MED ORDER — CEPHALEXIN 250 MG PO CAPS: 250.0000 mg | ORAL_CAPSULE | Freq: Two times a day (BID) | ORAL | 0 refills | Status: DC

## 2019-02-24 NOTE — TOC Transition Note (Signed)
Transition of Care [facility]) - CM/SW Discharge Note   Patient Details  Name: Cindy Graham MRN: [id number] Date of Birth: 1921-11-12  [facility] of [facility] CM/SW Contact:  Latanya Maudlin, RN Phone Number: 02/24/2019, 1:39 PM   Clinical Narrative: Patient set to return to facility. Patient is long term resident at [facility]. Spoke with [facility] in admissions and they are able to accept patient back today. Orders in place for PT/OT as patient was already receiving therapy at [facility]. Faxed orders, dc summary and neg COVID test to [place] for transport. Bedside RN to call report.           Final next level of care: Long Term Nursing Home Barriers to Discharge: No Barriers Identified   Patient Goals and CMS Choice   CMS Medicare.gov Compare Post Acute Care list provided to:: Patient Choice offered to / list presented to : Patient  Discharge Placement                       Discharge Plan and Services                                     Social Determinants of Health (SDOH) Interventions     Readmission Risk Interventions No flowsheet data found.

## 2019-02-24 NOTE — Discharge Summary (Signed)
[facility] at [facility], [place] y.o., DOB 20-Aug-1921, MRN [id number]. Admission date: 02/22/2019 Discharge Date 02/24/2019 Primary MD [facility] Admitting Physician Bettey Costa, MD  Admission Diagnosis  Cervical spine arthritis [M47.812] Occipital headache [R51.9]  Discharge Diagnosis   Active Problems: Acute left cerebellar stroke Urinary tract infect Essential hypertension Cervical radiculitis and osteoarthritis    Hospital Course  Cindy Graham  is a 85 y.o. female with a known history of neck pain seen by pain clinic on [place] and COPD who presents to the emergency room due to headache.  Patient reports that last night she had a 10 out of 10 severe headache.  She denies visual loss however does feel that her speech is impaired and she cannot think quite as clearly and she is also complaining of dizziness.  Patient underwent MRI which confirmed a small stroke.  She was seen by neurology who recommended patient being discharged on aspirin.  Patient currently has no symptoms.  She has chronic headaches likely related to her spinal stenosis.            Consults  neurology  Significant Tests:  See full reports for all details     Ct Head Wo Contrast  Addendum Date: 02/22/2019   ADDENDUM REPORT: 02/22/2019 17:36 ADDENDUM: This examination has been compared with the cranial MRI done earlier today (not available at the time of initial interpretation). There is no evidence of subarachnoid or subdural hemorrhage. Electronically Signed   By: Richardean Sale M.D.   On: 02/22/2019 17:36   Result Date: 02/22/2019 CLINICAL DATA:  Acute headache starting last night. Mild photosensitivity. No acute injury. EXAM: CT HEAD WITHOUT CONTRAST TECHNIQUE: Contiguous axial images were obtained from the base of the skull through the vertex without intravenous contrast. COMPARISON:  CT head 11/03/2017.  MRI brain 04/09/2016. FINDINGS: Brain: There  is no evidence of acute intracranial hemorrhage, mass lesion, brain edema or extra-axial fluid collection. Stable age-appropriate atrophy with mild prominence of the ventricles and subarachnoid spaces. There is no CT evidence of acute cortical infarction. There is a stable densely calcified left frontal meningioma, measuring up to 1.4 cm in diameter. Vascular: Intracranial vascular calcifications. No hyperdense vessel identified. Skull: Stable exophytic right frontal osteoma. No acute calvarial findings. Sinuses/Orbits: The visualized paranasal sinuses and mastoid air cells are clear. No orbital abnormalities are seen. Other: None. IMPRESSION: 1. Stable examination without evidence of acute intracranial process. 2. Stable calcified left frontal meningioma. Electronically Signed: By: Richardean Sale M.D. On: 02/22/2019 16:21   Mr Brain Wo Contrast  Result Date: 02/22/2019 CLINICAL DATA:  Headache, acute, normal neuro exam. Additional history provided: Headache and neck pain since yesterday. EXAM: MRI HEAD WITHOUT CONTRAST TECHNIQUE: Multiplanar, multiecho pulse sequences of the brain and surrounding structures were obtained without intravenous contrast. COMPARISON:  Head CT 11/03/2017, brain MRI 04/09/2016 FINDINGS: Brain: Multiple sequences are motion degraded, limiting evaluation There is a 6 mm focus of restricted diffusion within the left cerebellum consistent with acute infarct. Corresponding T2/FLAIR hyperintensity at this site. Again demonstrated is a dural-based extra-axial mass overlying the left frontal convexity which measures 1.3 x 1.1 x 1.4 cm (AP x TV x CC) on the current examination. This is similar to minimally increased in size as compared to prior examination 04/09/2016, at which time measuring 1.3 x 1.1 x 1.3 cm. Minimal mass effect upon the underlying left frontal lobe. There is an apparent thin FLAIR hyperintense extra-axial collection overlying the right  parietal lobe, as well as abnormal  FLAIR hyperintensity within right parietal, occipital and temporal lobe sulci (for instance as seen on series 1033, image 41). Mild scattered T2/FLAIR hyperintensity within the cerebral white matter is nonspecific, but consistent with chronic small vessel ischemic disease. No midline shift.  Mild generalized parenchymal atrophy. Vascular: Flow voids maintained within the proximal large arterial vessels. Skull and upper cervical spine: Incompletely assessed cervical spondylosis. This includes prominent pannus formation at the skull base and C1-C2 level with resultant mild craniocervical/spinal canal stenosis. Sinuses/Orbits: Visualized orbits demonstrate no acute abnormality. No significant paranasal sinus disease or mastoid effusion. Findings in the impression were called by telephone at the time of interpretation on 02/22/2019 at 3:53 pm to provider MARK QUALE , who verbally acknowledged these results. IMPRESSION: 1. Motion degraded examination. 2. 6 mm acute infarct within the left cerebellum. 3. Apparent thin FLAIR hyperintense extra-axial collection overlying the right parietal lobe as well as abnormal FLAIR hyperintensity within the right parietal, occipital and temporal lobe sulci. Noncontrast head CT is recommended to exclude acute subdural/subarachnoid hemorrhage. Alternative etiologies for the abnormal FLAIR hyperintensity would include infectious or carcinomatosis meningitis. Consider post-contrast MR imaging. 4. 1.4 cm dural-based extra-axial mass overlying the left frontal lobe, likely meningioma, similar to minimally increased in size since prior MRI 04/09/2016. 5. Atrophy and chronic small vessel ischemic disease. Electronically Signed   By: Kellie Simmering   On: 02/22/2019 15:55   Mr Cervical Spine Wo Contrast  Result Date: 02/22/2019 CLINICAL DATA:  Headache and neck pain EXAM: MRI CERVICAL SPINE WITHOUT CONTRAST TECHNIQUE: Multiplanar, multisequence MR imaging of the cervical spine was performed. No  intravenous contrast was administered. COMPARISON:  MRI cervical spine 02/04/2019 FINDINGS: Alignment: Mild anterolisthesis C4-5, C5-6, C6-7. Motion degraded image quality. Vertebrae: Hemangioma C7 vertebral body. Negative for fracture or mass. Cord: Cord evaluation limited due to motion. No cord lesion identified. Posterior Fossa, vertebral arteries, paraspinal tissues: Negative Disc levels: C1-2: Advanced arthropathy. Prominent pannus formation posterior to the dens causing mild spinal stenosis. This is unchanged from the recent study. C2-3: Negative C3-4: Negative C4-5: Mild anterolisthesis. Disc and facet degeneration without significant stenosis C5-6: Anterolisthesis with disc and facet degeneration. Mild spinal and foraminal stenosis unchanged C6-7: Mild anterolisthesis. Disc and facet degeneration without significant stenosis C7-T1: Negative IMPRESSION: Image quality degraded by extensive motion As best I can determine given the amount of motion, no change from the recent MRI cervical spine of 02/04/2019 Advanced arthropathy at C1-2 with prominent pannus posterior to the dens causing mild spinal stenosis. Electronically Signed   By: Franchot Gallo M.D.   On: 02/22/2019 14:43   Mr Cervical Spine Wo Contrast  Result Date: 02/04/2019 CLINICAL DATA:  A chronic neck pain since fall 2 months ago. EXAM: MRI CERVICAL SPINE WITHOUT CONTRAST TECHNIQUE: Multiplanar, multisequence MR imaging of the cervical spine was performed. No intravenous contrast was administered. COMPARISON:  CT cervical spine dated November 03, 2017. FINDINGS: Alignment: Unchanged 1-2 mm anterolisthesis at C4-C5, C5-C6, and C6-C7. Vertebrae: No fracture, evidence of discitis, or bone lesion. 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 Ligament hypertrophy posterior to the dens results in mild spinal canal stenosis at C1-C2. Cord: Normal signal and morphology. Posterior Fossa, vertebral arteries, paraspinal tissues: Negative. Disc levels: C2-C3:  Negative disc. Bilateral facet joint ankylosis. No stenosis. C3-C4: Negative disc. Bilateral facet joint ankylosis. No stenosis. C4-C5: Disc uncovering with tiny central protrusion. Moderate to severe bilateral facet arthropathy. Moderate left neuroforaminal stenosis. No spinal canal or right neuroforaminal stenosis. C5-C6:  Mild disc bulging effacing the ventral thecal sac. Moderate to severe bilateral facet arthropathy. No stenosis. C6-C7: Disc uncovering with small right paracentral protrusion. Moderate to severe bilateral facet arthropathy. No stenosis. C7-T1: Negative disc. Moderate to severe bilateral facet arthropathy. No stenosis. No significant degenerative disc disease or stenosis in the visualized upper thoracic spine. IMPRESSION: 1. 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 Ligamentum hypertrophy posterior to the dens results in mild spinal canal stenosis at C1-C2. 2. Mild degenerative disc disease and moderate to severe facet arthropathy from C4-C5 through C7-T1. Moderate left neuroforaminal stenosis at C4-C5. Electronically Signed   By: Titus Dubin M.D.   On: 02/04/2019 11:03   US Carotid Bilateral (at [facility] Only)  Result Date: 02/23/2019 CLINICAL DATA:  85 year old female with a history stroke EXAM: BILATERAL CAROTID DUPLEX ULTRASOUND TECHNIQUE: Pearline Cables scale imaging, color Doppler and duplex ultrasound were performed of bilateral carotid and vertebral arteries in the neck. COMPARISON:  None. FINDINGS: Criteria: Quantification of carotid stenosis is based on velocity parameters that correlate the residual internal carotid diameter with NASCET-based stenosis levels, using the diameter of the distal internal carotid lumen as the denominator for stenosis measurement. The following velocity measurements were obtained: RIGHT ICA:  Systolic 90 cm/sec, Diastolic 18 cm/sec CCA:  96 cm/sec SYSTOLIC ICA/CCA RATIO:  0.9 ECA:  111 cm/sec LEFT ICA:  Systolic 79 cm/sec, Diastolic 15 cm/sec CCA:  67  cm/sec SYSTOLIC ICA/CCA RATIO:  1.2 ECA:  100 cm/sec Right Brachial SBP: Not acquired Left Brachial SBP: Not acquired RIGHT CAROTID ARTERY: No significant calcifications of the right common carotid artery. Intermediate waveform maintained. Heterogeneous and partially calcified plaque at the right carotid bifurcation. No significant lumen shadowing. Low resistance waveform of the right ICA. No significant tortuosity. RIGHT VERTEBRAL ARTERY: Antegrade flow with low resistance waveform. LEFT CAROTID ARTERY: No significant calcifications of the left common carotid artery. Intermediate waveform maintained. Heterogeneous and partially calcified plaque at the left carotid bifurcation without significant lumen shadowing. Low resistance waveform of the left ICA. No significant tortuosity. LEFT VERTEBRAL ARTERY:  Antegrade flow with low resistance waveform. IMPRESSION: Color duplex indicates minimal heterogeneous and calcified plaque, with no hemodynamically significant stenosis by duplex criteria in the extracranial cerebrovascular circulation. Signed, Dulcy Fanny. Dellia Nims, RPVI [facility] Electronically Signed   By: Corrie Mckusick D.O.   On: 02/23/2019 09:07       Today   Subjective:   Cindy Graham patient currently denying any symptoms Objective:   Blood pressure 128/65, pulse 70, temperature 98.5 F (36.9 C), resp. rate 16, height 5\' 3"  (1.6 m), weight 69.3 kg, SpO2 96 %.  .  Intake/Output Summary (Last 24 hours) at 02/24/2019 1230 Last data filed at 02/23/2019 2300 Gross per 24 hour  Intake 240 ml  Output 1400 ml  Net -1160 ml    Exam VITAL SIGNS: Blood pressure 128/65, pulse 70, temperature 98.5 F (36.9 C), resp. rate 16, height 5\' 3"  (1.6 m), weight 69.3 kg, SpO2 96 %.  GENERAL:  85 y.o.-year-old patient lying in the bed with no acute distress.  EYES: Pupils equal, round, reactive to light and accommodation. No scleral icterus.  Extraocular muscles intact.  HEENT: Head atraumatic, normocephalic. Oropharynx and nasopharynx clear.  NECK:  Supple, no jugular venous distention. No thyroid enlargement, no tenderness.  LUNGS: Normal breath sounds bilaterally, no wheezing, rales,rhonchi or crepitation. No use of accessory muscles of respiration.  CARDIOVASCULAR: S1, S2 normal. No murmurs, rubs, or gallops.  ABDOMEN: Soft,  nontender, nondistended. Bowel sounds present. No organomegaly or mass.  EXTREMITIES: No pedal edema, cyanosis, or clubbing.  NEUROLOGIC: Cranial nerves II through XII are intact. Muscle strength 5/5 in all extremities. Sensation intact. Gait not checked.  PSYCHIATRIC: The patient is alert and oriented x 3.  SKIN: No obvious rash, lesion, or ulcer.   Data Review     CBC w Diff:  Lab Results  Component Value Date   WBC 8.4 02/22/2019   HGB 13.2 02/22/2019   HGB 12.9 06/07/2013   HCT 41.4 02/22/2019   HCT 38.7 06/07/2013   PLT 283 02/22/2019   PLT 255 06/07/2013   LYMPHOPCT 35 02/22/2019   LYMPHOPCT 26.8 03/15/2013   MONOPCT 8 02/22/2019   MONOPCT 9.9 03/15/2013   EOSPCT 5 02/22/2019   EOSPCT 1.5 03/15/2013   BASOPCT 1 02/22/2019   BASOPCT 0.9 03/15/2013   CMP:  Lab Results  Component Value Date   NA 135 02/22/2019   NA 138 06/13/2014   NA 141 06/07/2013   K 4.0 02/22/2019   K 3.8 06/07/2013   CL 98 02/22/2019   CL 108 (H) 06/07/2013   CO2 28 02/22/2019   CO2 27 06/07/2013   BUN 26 (H) 02/22/2019   BUN 24 (A) 06/13/2014   BUN 22 (H) 06/07/2013   CREATININE 1.24 (H) 02/22/2019   CREATININE 1.10 (H) 11/24/2017   GLU 91 06/13/2014   PROT 7.8 06/12/2018   PROT 7.4 06/01/2013   ALBUMIN 3.6 06/12/2018   ALBUMIN 3.3 (L) 06/01/2013   BILITOT 0.8 06/12/2018   BILITOT 0.5 06/01/2013   ALKPHOS 86 06/12/2018   ALKPHOS 65 06/01/2013   AST 34 06/12/2018   AST 29 06/01/2013   ALT 19 06/12/2018   ALT 24 06/01/2013  .  Micro Results Recent Results (from the past 240 hour(s))  Urine  Culture     Status: Abnormal (Preliminary result)   Collection Time: 02/22/19  2:45 PM   Specimen: Urine, Clean Catch  Result Value Ref Range Status   Specimen Description   Final    URINE, CLEAN CATCH Performed at [facility], [address], [place], [place] [address]    Special Requests   Final    Normal Performed at [facility], [address] [place], [place] [address]    Culture >=100,000 COLONIES/mL GRAM NEGATIVE RODS (A)  Final   Report Status PENDING  Incomplete  SARS Coronavirus 2 [facility] order, Performed in [facility] hospital lab) Nasopharyngeal Nasopharyngeal Swab     Status: None   Collection Time: 02/22/19  6:17 PM   Specimen: Nasopharyngeal Swab  Result Value Ref Range Status   SARS Coronavirus 2 NEGATIVE NEGATIVE Final    Comment: (NOTE) If result is NEGATIVE SARS-CoV-2 target nucleic acids are NOT DETECTED. The SARS-CoV-2 RNA is generally detectable in upper and lower  respiratory specimens during the acute phase of infection. The lowest  concentration of SARS-CoV-2 viral copies this assay can detect is 250  copies / mL. A negative result does not preclude SARS-CoV-2 infection  and should not be used as the sole basis for treatment or other  patient management decisions.  A negative result may occur with  improper specimen collection / handling, submission of specimen other  than nasopharyngeal swab, presence of viral mutation(s) within the  areas targeted by this assay, and inadequate number of viral copies  (<250 copies / mL). A negative result must be combined with clinical  observations, patient history, and epidemiological information. If result  is POSITIVE SARS-CoV-2 target nucleic acids are DETECTED. The SARS-CoV-2 RNA is generally detectable in upper and lower  respiratory specimens dur ing the acute phase of infection.  Positive  results are indicative of active infection with SARS-CoV-2.  Clinical  correlation with patient  history and other diagnostic information is  necessary to determine patient infection status.  Positive results do  not rule out bacterial infection or co-infection with other viruses. If result is PRESUMPTIVE POSTIVE SARS-CoV-2 nucleic acids MAY BE PRESENT.   A presumptive positive result was obtained on the submitted specimen  and confirmed on repeat testing.  While 2019 novel coronavirus  (SARS-CoV-2) nucleic acids may be present in the submitted sample  additional confirmatory testing may be necessary for epidemiological  and / or clinical management purposes  to differentiate between  SARS-CoV-2 and other Sarbecovirus currently known to infect humans.  If clinically indicated additional testing with an alternate test  methodology [phone]) is advised. The SARS-CoV-2 RNA is generally  detectable in upper and lower respiratory sp ecimens during the acute  phase of infection. The expected result is Negative. Fact Sheet for Patients:  StrictlyIdeas.no Fact Sheet for Healthcare Providers: BankingDealers.co.za This test is not yet approved or cleared by the [place] FDA and has been authorized for detection and/or diagnosis of SARS-CoV-2 by FDA under an Emergency Use Authorization (EUA).  This EUA will remain in effect (meaning this test can be used) for the duration of the COVID-19 declaration under Section 564(b)(1) of the Act, 21 U.S.C. section 360bbb-3(b)(1), unless the authorization is terminated or revoked sooner. Performed at [facility], [address] [place], [place] [address]         Code Status Orders  (From admission, onward)         Start     Ordered   02/22/19 1914  Do not attempt resuscitation (DNR)  Continuous    Question Answer Comment  In the event of cardiac or respiratory ARREST Do not call a "code blue"   In the event of cardiac or respiratory ARREST Do not perform Intubation, CPR,  defibrillation or ACLS   In the event of cardiac or respiratory ARREST Use medication by any route, position, wound care, and other measures to relive pain and suffering. May use oxygen, suction and manual treatment of airway obstruction as needed for comfort.      02/22/19 1913        Code Status History    Date Active Date Inactive Code Status Order ID Comments User Context   06/12/2018 1827 06/16/2018 1506 DNR [id number]  [name], MD Inpatient   Advance Care Planning Activity          Follow-up Information    Schedule an appointment as soon as possible for a visit with [facility].   Specialty: Geriatric Medicine Why: For ER visit follow-up and reevaluation. Contact information: [place] [address] [phone]        Your Pain Medicine Doctor (as planned Monday). Go on 02/25/2019.           Discharge Medications   Allergies as of 02/24/2019      Reactions   Statins Other (See Comments)   Very bad muscle aches   Tramadol Itching   Levaquin [levofloxacin In D5w] Diarrhea, Other (See Comments)   Confusion, hallucinations   Neomycin-polymyxin-gramicidin Other (See Comments)   Penicillins    Did it involve swelling of the face/tongue/throat, SOB, or low BP?  Unknown Did it involve sudden or severe rash/hives, skin peeling, or any reaction on the inside of your mouth or nose? Unknown Did you need to seek medical attention at a hospital or doctor's office? Unknown When did it last happen?Unknown If all above answers are "NO", may proceed with cephalosporin use.   Sulfa Antibiotics    Tetracyclines & Related       Medication List    TAKE these medications   acetaminophen 500 MG tablet Commonly known as: TYLENOL Take 500 mg by mouth every 6 (six) hours as needed for mild pain or moderate pain.   acetaminophen 325 MG tablet Commonly known as: TYLENOL Take 650 mg by mouth 2 (two) times daily.    alendronate 70 MG tablet Commonly known as: FOSAMAX Take 70 mg by mouth every Thursday.   aspirin 81 MG chewable tablet Chew 81 mg by mouth daily.   Biotin 1000 MCG tablet Take 1,000 mcg by mouth daily.   cephALEXin 250 MG capsule Commonly known as: KEFLEX Take 1 capsule (250 mg total) by mouth every 12 (twelve) hours for 5 days.   Daily Vites tablet Take 1 tablet by mouth daily.   enalapril 20 MG tablet Commonly known as: VASOTEC TAKE ONE TABLET BY MOUTH EVERY DAY   Flax Seed Oil 1000 MG Caps Take 1,000 mg by mouth daily.   fluticasone 220 MCG/ACT inhaler Commonly known as: Flovent HFA Inhale 2 puffs into the lungs 2 (two) times daily.   gabapentin 300 MG capsule Commonly known as: NEURONTIN Take 1 capsule (300 mg total) by mouth at bedtime. What changed: when to take this   guaifenesin 100 MG/5ML syrup Commonly known as: ROBITUSSIN Take 200 mg by mouth every 6 (six) hours as needed for cough.   lactulose 10 GM/15ML solution Commonly known as: CHRONULAC Take 30 mLs (20 g total) by mouth daily as needed for mild constipation.   Lidocaine 4 % Ptch Apply 1 patch topically daily.   loperamide 2 MG capsule Commonly known as: IMODIUM Take 2 mg by mouth 4 (four) times daily as needed for diarrhea or loose stools.   magnesium hydroxide 400 MG/5ML suspension Commonly known as: MILK OF MAGNESIA Take 30 mLs by mouth daily as needed for mild constipation.   Mintox [id number] MG/5ML suspension Generic drug: alum & mag hydroxide-simeth Take 30 mLs by mouth 3 (three) times daily as needed for indigestion or heartburn.   neomycin-bacitracin-polymyxin ointment Commonly known as: NEOSPORIN Apply 1 application topically daily. Apply to skin tear on right calf   nitroGLYCERIN 0.4 MG SL tablet Commonly known as: NITROSTAT Place 0.4 mg under the tongue every 5 (five) minutes as needed for chest pain. What changed: Another medication with the same name was removed. Continue  taking this medication, and follow the directions you see here.   Nucynta 50 MG tablet Generic drug: tapentadol Take 50 mg by mouth 3 (three) times daily.   nystatin cream Commonly known as: MYCOSTATIN Apply 1 application topically as needed (panniculitis). Apply to pannus   omeprazole 20 MG capsule Commonly known as: PRILOSEC TAKE 1 CAPSULE BY MOUTH EVERY DAY. What changed:   how much to take  how to take this  when to take this  additional instructions   oxybutynin 5 MG tablet Commonly known as: DITROPAN TAKE ONE TABLET BY MOUTH EVERY DAY   propranolol 60 MG tablet Commonly known as: INDERAL Take 60 mg by mouth daily. What changed: Another medication with the same name was removed. Continue  taking this medication, and follow the directions you see here.   sertraline 100 MG tablet Commonly known as: ZOLOFT TAKE ONE TABLET BY MOUTH EVERY DAY What changed:   how much to take  how to take this  when to take this  additional instructions   triamterene-hydrochlorothiazide 37.5-25 MG capsule Commonly known as: DYAZIDE Take 1 capsule by mouth daily.   trolamine salicylate 10 % cream Commonly known as: ASPERCREME Apply 1 application topically 3 (three) times daily as needed for muscle pain.   verapamil 240 MG 24 hr capsule Commonly known as: VERELAN PM Take 240 mg by mouth at bedtime.          Total Time in preparing paper work, data evaluation and todays exam - 24 minutes  Dustin Flock M.D on 02/24/2019 at 12:30 PM [place]  [phone]

## 2019-02-24 NOTE — Plan of Care (Signed)
  Problem: Education: Goal: Knowledge of General Education information will improve Description: Including pain rating scale, medication(s)/side effects and non-pharmacologic comfort measures Outcome: Adequate for Discharge   Problem: Health Behavior/Discharge Planning: Goal: Ability to manage health-related needs will improve Outcome: Adequate for Discharge   Problem: Clinical Measurements: Goal: Ability to maintain clinical measurements within normal limits will improve Outcome: Adequate for Discharge Goal: Will remain free from infection Outcome: Adequate for Discharge Goal: Diagnostic test results will improve Outcome: Adequate for Discharge Goal: Respiratory complications will improve Outcome: Adequate for Discharge Goal: Cardiovascular complication will be avoided Outcome: Adequate for Discharge   Problem: Activity: Goal: Risk for activity intolerance will decrease Outcome: Adequate for Discharge   Problem: Nutrition: Goal: Adequate nutrition will be maintained Outcome: Adequate for Discharge   Problem: Coping: Goal: Level of anxiety will decrease Outcome: Adequate for Discharge   Problem: Elimination: Goal: Will not experience complications related to bowel motility Outcome: Adequate for Discharge Goal: Will not experience complications related to urinary retention Outcome: Adequate for Discharge   Problem: Pain Managment: Goal: General experience of comfort will improve Outcome: Adequate for Discharge   Problem: Safety: Goal: Ability to remain free from injury will improve Outcome: Adequate for Discharge   Problem: Skin Integrity: Goal: Risk for impaired skin integrity will decrease Outcome: Adequate for Discharge   Problem: Education: Goal: Knowledge of disease or condition will improve Outcome: Adequate for Discharge Goal: Knowledge of secondary prevention will improve Outcome: Adequate for Discharge Goal: Knowledge of patient specific risk factors  addressed and post discharge goals established will improve Outcome: Adequate for Discharge   Problem: Coping: Goal: Will verbalize positive feelings about self Outcome: Adequate for Discharge Goal: Will identify appropriate support needs Outcome: Adequate for Discharge   Problem: Health Behavior/Discharge Planning: Goal: Ability to manage health-related needs will improve Outcome: Adequate for Discharge   Problem: Self-Care: Goal: Ability to participate in self-care as condition permits will improve Outcome: Adequate for Discharge Goal: Verbalization of feelings and concerns over difficulty with self-care will improve Outcome: Adequate for Discharge Goal: Ability to communicate needs accurately will improve Outcome: Adequate for Discharge   Problem: Nutrition: Goal: Risk of aspiration will decrease Outcome: Adequate for Discharge   Problem: Ischemic Stroke/TIA Tissue Perfusion: Goal: Complications of ischemic stroke/TIA will be minimized Outcome: Adequate for Discharge

## 2019-02-24 NOTE — Progress Notes (Signed)
Patient transported to [facility] via EMS transport.

## 2019-02-24 NOTE — Progress Notes (Signed)
Report called to [place], receiving nurse at [facility].

## 2019-02-24 NOTE — Progress Notes (Signed)
EMS called for transport.

## 2019-02-25 DIAGNOSIS — G8929 Other chronic pain: Secondary | ICD-10-CM | POA: Diagnosis not present

## 2019-02-25 DIAGNOSIS — I10 Essential (primary) hypertension: Secondary | ICD-10-CM | POA: Diagnosis not present

## 2019-02-25 DIAGNOSIS — I639 Cerebral infarction, unspecified: Secondary | ICD-10-CM | POA: Diagnosis not present

## 2019-02-25 DIAGNOSIS — R269 Unspecified abnormalities of gait and mobility: Secondary | ICD-10-CM | POA: Diagnosis not present

## 2019-02-25 DIAGNOSIS — N3000 Acute cystitis without hematuria: Secondary | ICD-10-CM | POA: Diagnosis not present

## 2019-02-25 DIAGNOSIS — M542 Cervicalgia: Secondary | ICD-10-CM | POA: Diagnosis not present

## 2019-02-25 DIAGNOSIS — M47812 Spondylosis without myelopathy or radiculopathy, cervical region: Secondary | ICD-10-CM | POA: Diagnosis not present

## 2019-02-25 LAB — URINE CULTURE
Culture: >=100000 — AB
Special Requests: NORMAL

## 2019-02-27 ENCOUNTER — Emergency Department: Payer: Medicare Other

## 2019-02-27 ENCOUNTER — Inpatient Hospital Stay
Admission: EM | Admit: 2019-02-27 | Discharge: 2019-03-01 | DRG: 682 | Disposition: A | Discharge status: Skilled Nursing Facility | Payer: Medicare Other | Source: Skilled Nursing Facility | Attending: Specialist | Admitting: Specialist

## 2019-02-27 ENCOUNTER — Other Ambulatory Visit: Payer: Self-pay

## 2019-02-27 ENCOUNTER — Encounter: Payer: Self-pay | Admitting: Emergency Medicine

## 2019-02-27 DIAGNOSIS — E860 Dehydration: Secondary | ICD-10-CM | POA: Diagnosis present

## 2019-02-27 DIAGNOSIS — Z823 Family history of stroke: Secondary | ICD-10-CM

## 2019-02-27 DIAGNOSIS — Z9049 Acquired absence of other specified parts of digestive tract: Secondary | ICD-10-CM

## 2019-02-27 DIAGNOSIS — N179 Acute kidney failure, unspecified: Secondary | ICD-10-CM

## 2019-02-27 DIAGNOSIS — N390 Urinary tract infection, site not specified: Secondary | ICD-10-CM | POA: Diagnosis present

## 2019-02-27 DIAGNOSIS — J449 Chronic obstructive pulmonary disease, unspecified: Secondary | ICD-10-CM | POA: Diagnosis present

## 2019-02-27 DIAGNOSIS — Z89421 Acquired absence of other right toe(s): Secondary | ICD-10-CM

## 2019-02-27 DIAGNOSIS — F329 Major depressive disorder, single episode, unspecified: Secondary | ICD-10-CM | POA: Diagnosis present

## 2019-02-27 DIAGNOSIS — R4781 Slurred speech: Secondary | ICD-10-CM | POA: Diagnosis not present

## 2019-02-27 DIAGNOSIS — R0902 Hypoxemia: Secondary | ICD-10-CM | POA: Diagnosis not present

## 2019-02-27 DIAGNOSIS — K219 Gastro-esophageal reflux disease without esophagitis: Secondary | ICD-10-CM | POA: Diagnosis present

## 2019-02-27 DIAGNOSIS — Z888 Allergy status to other drugs, medicaments and biological substances status: Secondary | ICD-10-CM

## 2019-02-27 DIAGNOSIS — N170 Acute kidney failure with tubular necrosis: Secondary | ICD-10-CM | POA: Diagnosis present

## 2019-02-27 DIAGNOSIS — R410 Disorientation, unspecified: Secondary | ICD-10-CM

## 2019-02-27 DIAGNOSIS — Z03818 Encounter for observation for suspected exposure to other biological agents ruled out: Secondary | ICD-10-CM | POA: Diagnosis not present

## 2019-02-27 DIAGNOSIS — Z881 Allergy status to other antibiotic agents status: Secondary | ICD-10-CM

## 2019-02-27 DIAGNOSIS — Z8673 Personal history of transient ischemic attack (TIA), and cerebral infarction without residual deficits: Secondary | ICD-10-CM

## 2019-02-27 DIAGNOSIS — I10 Essential (primary) hypertension: Secondary | ICD-10-CM | POA: Diagnosis not present

## 2019-02-27 DIAGNOSIS — Z20828 Contact with and (suspected) exposure to other viral communicable diseases: Secondary | ICD-10-CM | POA: Diagnosis present

## 2019-02-27 DIAGNOSIS — Z85820 Personal history of malignant melanoma of skin: Secondary | ICD-10-CM | POA: Diagnosis not present

## 2019-02-27 DIAGNOSIS — E785 Hyperlipidemia, unspecified: Secondary | ICD-10-CM | POA: Diagnosis present

## 2019-02-27 DIAGNOSIS — Z880 Allergy status to penicillin: Secondary | ICD-10-CM | POA: Diagnosis not present

## 2019-02-27 DIAGNOSIS — Z90710 Acquired absence of both cervix and uterus: Secondary | ICD-10-CM

## 2019-02-27 DIAGNOSIS — Z7982 Long term (current) use of aspirin: Secondary | ICD-10-CM

## 2019-02-27 DIAGNOSIS — I639 Cerebral infarction, unspecified: Secondary | ICD-10-CM | POA: Diagnosis not present

## 2019-02-27 DIAGNOSIS — Z800 Family history of malignant neoplasm of digestive organs: Secondary | ICD-10-CM

## 2019-02-27 DIAGNOSIS — N183 Chronic kidney disease, stage 3 unspecified: Secondary | ICD-10-CM | POA: Diagnosis present

## 2019-02-27 DIAGNOSIS — R4182 Altered mental status, unspecified: Secondary | ICD-10-CM | POA: Diagnosis not present

## 2019-02-27 DIAGNOSIS — I959 Hypotension, unspecified: Secondary | ICD-10-CM | POA: Diagnosis not present

## 2019-02-27 DIAGNOSIS — G9341 Metabolic encephalopathy: Secondary | ICD-10-CM | POA: Diagnosis present

## 2019-02-27 DIAGNOSIS — Z882 Allergy status to sulfonamides status: Secondary | ICD-10-CM | POA: Diagnosis not present

## 2019-02-27 DIAGNOSIS — Z79899 Other long term (current) drug therapy: Secondary | ICD-10-CM | POA: Diagnosis not present

## 2019-02-27 DIAGNOSIS — B965 Pseudomonas (aeruginosa) (mallei) (pseudomallei) as the cause of diseases classified elsewhere: Secondary | ICD-10-CM | POA: Diagnosis present

## 2019-02-27 DIAGNOSIS — R531 Weakness: Secondary | ICD-10-CM | POA: Diagnosis not present

## 2019-02-27 DIAGNOSIS — Z66 Do not resuscitate: Secondary | ICD-10-CM | POA: Diagnosis present

## 2019-02-27 DIAGNOSIS — M1990 Unspecified osteoarthritis, unspecified site: Secondary | ICD-10-CM | POA: Diagnosis present

## 2019-02-27 DIAGNOSIS — I6389 Other cerebral infarction: Secondary | ICD-10-CM | POA: Diagnosis not present

## 2019-02-27 DIAGNOSIS — Z820 Family history of epilepsy and other diseases of the nervous system: Secondary | ICD-10-CM

## 2019-02-27 DIAGNOSIS — I129 Hypertensive chronic kidney disease with stage 1 through stage 4 chronic kidney disease, or unspecified chronic kidney disease: Secondary | ICD-10-CM | POA: Diagnosis present

## 2019-02-27 DIAGNOSIS — Z8249 Family history of ischemic heart disease and other diseases of the circulatory system: Secondary | ICD-10-CM

## 2019-02-27 LAB — URINALYSIS, COMPLETE (UACMP) WITH MICROSCOPIC
Glucose, UA: NEGATIVE mg/dL
Hgb urine dipstick: NEGATIVE
Ketones, ur: NEGATIVE mg/dL
Nitrite: NEGATIVE
Protein, ur: 100 mg/dL — AB
Specific Gravity, Urine: 1.017 (ref 1.005–1.030)
Squamous Epithelial / HPF: NONE SEEN (ref 0–5)
WBC, UA: >50 WBC/hpf — ABNORMAL HIGH (ref 0–5)
pH: 7 (ref 5.0–8.0)

## 2019-02-27 LAB — COMPREHENSIVE METABOLIC PANEL WITH GFR
ALT: 18 U/L (ref 0–44)
AST: 31 U/L (ref 15–41)
Albumin: 3.5 g/dL (ref 3.5–5.0)
Alkaline Phosphatase: 63 U/L (ref 38–126)
Anion gap: 11 (ref 5–15)
BUN: 46 mg/dL — ABNORMAL HIGH (ref 8–23)
CO2: 24 mmol/L (ref 22–32)
Calcium: 8.7 mg/dL — ABNORMAL LOW (ref 8.9–10.3)
Chloride: 96 mmol/L — ABNORMAL LOW (ref 98–111)
Creatinine, Ser: 2.32 mg/dL — ABNORMAL HIGH (ref 0.44–1.00)
GFR calc Af Amer: 20 mL/min — ABNORMAL LOW
GFR calc non Af Amer: 17 mL/min — ABNORMAL LOW
Glucose, Bld: 102 mg/dL — ABNORMAL HIGH (ref 70–99)
Potassium: 4.8 mmol/L (ref 3.5–5.1)
Sodium: 131 mmol/L — ABNORMAL LOW (ref 135–145)
Total Bilirubin: 0.7 mg/dL (ref 0.3–1.2)
Total Protein: 6.6 g/dL (ref 6.5–8.1)

## 2019-02-27 LAB — CBC
HCT: 38.8 % (ref 36.0–46.0)
Hemoglobin: 12.5 g/dL (ref 12.0–15.0)
MCH: 31.8 pg (ref 26.0–34.0)
MCHC: 32.2 g/dL (ref 30.0–36.0)
MCV: 98.7 fL (ref 80.0–100.0)
Platelets: 271 10*3/uL (ref 150–400)
RBC: 3.93 MIL/uL (ref 3.87–5.11)
RDW: 13.7 % (ref 11.5–15.5)
WBC: 8.7 10*3/uL (ref 4.0–10.5)
nRBC: 0 % (ref 0.0–0.2)

## 2019-02-27 LAB — DIFFERENTIAL
Abs Immature Granulocytes: 0.03 K/uL (ref 0.00–0.07)
Basophils Absolute: 0.1 K/uL (ref 0.0–0.1)
Basophils Relative: 1 %
Eosinophils Absolute: 0.2 K/uL (ref 0.0–0.5)
Eosinophils Relative: 2 %
Immature Granulocytes: 0 %
Lymphocytes Relative: 28 %
Lymphs Abs: 2.4 K/uL (ref 0.7–4.0)
Monocytes Absolute: 0.8 K/uL (ref 0.1–1.0)
Monocytes Relative: 9 %
Neutro Abs: 5.2 K/uL (ref 1.7–7.7)
Neutrophils Relative %: 60 %

## 2019-02-27 LAB — ETHANOL: Alcohol, Ethyl (B): <10 mg/dL (ref <10)

## 2019-02-27 LAB — PROCALCITONIN: Procalcitonin: <0.1 ng/mL

## 2019-02-27 LAB — PROTIME-INR
INR: 0.9 (ref 0.8–1.2)
Prothrombin Time: 12.4 s (ref 11.4–15.2)

## 2019-02-27 LAB — MRSA PCR SCREENING: MRSA by PCR: NEGATIVE

## 2019-02-27 LAB — APTT: aPTT: 29 seconds (ref 24–36)

## 2019-02-27 LAB — SARS CORONAVIRUS 2 BY RT PCR (HOSPITAL ORDER, PERFORMED IN ~~LOC~~ HOSPITAL LAB): SARS Coronavirus 2: NEGATIVE

## 2019-02-27 MED ORDER — LOPERAMIDE HCL 2 MG PO CAPS: 2.0000 mg | ORAL_CAPSULE | Freq: Four times a day (QID) | ORAL | Status: DC | PRN

## 2019-02-27 MED ORDER — PANTOPRAZOLE SODIUM 40 MG PO TBEC
40.0000 mg | DELAYED_RELEASE_TABLET | Freq: Every day | ORAL | Status: DC
  Administered 2019-02-28 – 2019-03-01 (×2): 40 mg via ORAL
  Filled 2019-02-27 (×2): qty 1

## 2019-02-27 MED ORDER — SERTRALINE HCL 50 MG PO TABS
100.0000 mg | ORAL_TABLET | Freq: Every day | ORAL | Status: DC
  Administered 2019-02-28 – 2019-03-01 (×2): 100 mg via ORAL
  Filled 2019-02-27 (×2): qty 2

## 2019-02-27 MED ORDER — BISACODYL 5 MG PO TBEC
5.0000 mg | DELAYED_RELEASE_TABLET | Freq: Every day | ORAL | Status: DC | PRN
  Administered 2019-02-28 – 2019-03-01 (×2): 5 mg via ORAL
  Filled 2019-02-27 (×2): qty 1

## 2019-02-27 MED ORDER — PROPRANOLOL HCL 40 MG PO TABS
60.0000 mg | ORAL_TABLET | Freq: Every day | ORAL | Status: DC
  Administered 2019-02-28 – 2019-03-01 (×2): 60 mg via ORAL
  Filled 2019-02-27 (×2): qty 1

## 2019-02-27 MED ORDER — FLUTICASONE PROPIONATE HFA 220 MCG/ACT IN AERO: 2.0000 | INHALATION_SPRAY | Freq: Two times a day (BID) | RESPIRATORY_TRACT | Status: DC

## 2019-02-27 MED ORDER — ENALAPRIL MALEATE 10 MG PO TABS: 20.0000 mg | ORAL_TABLET | Freq: Every day | ORAL | Status: DC

## 2019-02-27 MED ORDER — LIDOCAINE 5 % EX PTCH
1.0000 | MEDICATED_PATCH | Freq: Every day | CUTANEOUS | Status: DC
  Administered 2019-02-28 – 2019-03-01 (×2): 1 via TRANSDERMAL
  Filled 2019-02-27 (×2): qty 1

## 2019-02-27 MED ORDER — ONDANSETRON HCL 4 MG PO TABS: 4.0000 mg | ORAL_TABLET | Freq: Four times a day (QID) | ORAL | Status: DC | PRN

## 2019-02-27 MED ORDER — ACETAMINOPHEN 650 MG RE SUPP: 650.0000 mg | Freq: Four times a day (QID) | RECTAL | Status: DC | PRN

## 2019-02-27 MED ORDER — SODIUM CHLORIDE 0.9 % IV BOLUS: 500.0000 mL | Freq: Once | INTRAVENOUS | Status: DC

## 2019-02-27 MED ORDER — MELOXICAM 7.5 MG PO TABS
15.0000 mg | ORAL_TABLET | Freq: Every day | ORAL | Status: DC
  Administered 2019-02-28 – 2019-03-01 (×2): 15 mg via ORAL
  Filled 2019-02-27 (×2): qty 2

## 2019-02-27 MED ORDER — GUAIFENESIN 100 MG/5ML PO SOLN
200.0000 mg | Freq: Four times a day (QID) | ORAL | Status: DC | PRN
  Filled 2019-02-27: qty 10

## 2019-02-27 MED ORDER — BUDESONIDE 0.25 MG/2ML IN SUSP
0.2500 mg | Freq: Two times a day (BID) | RESPIRATORY_TRACT | Status: DC
  Administered 2019-02-27 – 2019-03-01 (×3): 0.25 mg via RESPIRATORY_TRACT
  Filled 2019-02-27 (×3): qty 2

## 2019-02-27 MED ORDER — SODIUM CHLORIDE 0.9 % IV SOLN
INTRAVENOUS | Status: DC
  Administered 2019-02-27 – 2019-02-28 (×3): via INTRAVENOUS

## 2019-02-27 MED ORDER — ASPIRIN 81 MG PO CHEW
81.0000 mg | CHEWABLE_TABLET | Freq: Every day | ORAL | Status: DC
  Administered 2019-02-28 – 2019-03-01 (×2): 81 mg via ORAL
  Filled 2019-02-27 (×2): qty 1

## 2019-02-27 MED ORDER — HEPARIN SODIUM (PORCINE) 5000 UNIT/ML IJ SOLN
5000.0000 [IU] | Freq: Three times a day (TID) | INTRAMUSCULAR | Status: DC
  Administered 2019-02-27 – 2019-03-01 (×4): 5000 [IU] via SUBCUTANEOUS
  Filled 2019-02-27 (×4): qty 1

## 2019-02-27 MED ORDER — ACETAMINOPHEN 325 MG PO TABS
650.0000 mg | ORAL_TABLET | Freq: Four times a day (QID) | ORAL | Status: DC | PRN
  Administered 2019-03-01: 650 mg via ORAL
  Filled 2019-02-27: qty 2

## 2019-02-27 MED ORDER — TRIAMTERENE-HCTZ 37.5-25 MG PO CAPS: 1.0000 | ORAL_CAPSULE | Freq: Every day | ORAL | Status: DC

## 2019-02-27 MED ORDER — VERAPAMIL HCL ER 240 MG PO TBCR
240.0000 mg | EXTENDED_RELEASE_TABLET | Freq: Every day | ORAL | Status: DC
  Administered 2019-02-28: 22:00:00 240 mg via ORAL
  Filled 2019-02-27 (×3): qty 1

## 2019-02-27 MED ORDER — ONDANSETRON HCL 4 MG/2ML IJ SOLN: 4.0000 mg | Freq: Four times a day (QID) | INTRAMUSCULAR | Status: DC | PRN

## 2019-02-27 MED ORDER — MAGNESIUM HYDROXIDE 400 MG/5ML PO SUSP
30.0000 mL | Freq: Every day | ORAL | Status: DC | PRN
  Administered 2019-03-01: 30 mL via ORAL
  Filled 2019-02-27: qty 30

## 2019-02-27 MED ORDER — TRAZODONE HCL 50 MG PO TABS: 25.0000 mg | ORAL_TABLET | Freq: Every evening | ORAL | Status: DC | PRN

## 2019-02-27 MED ORDER — TROLAMINE SALICYLATE 10 % EX CREA
1.0000 "application " | TOPICAL_CREAM | Freq: Three times a day (TID) | CUTANEOUS | Status: DC | PRN
  Filled 2019-02-27: qty 85

## 2019-02-27 MED ORDER — SODIUM CHLORIDE 0.9 % IV BOLUS
1000.0000 mL | Freq: Once | INTRAVENOUS | Status: AC
  Administered 2019-02-27: 16:00:00 1000 mL via INTRAVENOUS

## 2019-02-27 NOTE — ED Triage Notes (Signed)
Pt to Er via EMS from [facility] after family called staff to check on her.  Per staff pt had slurred speech and was slumped in her chair.  EMS reports on their arrival pt had returned to baseline and was ambulatory to stretcher.  Pt arrives A&O x 4 with no noted deficits.

## 2019-02-27 NOTE — Consult Note (Signed)
Reason for Consult:AMS Referring Physician: *ER  CC: AMS  HPI: Cindy Graham is an 85 y.o. 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 Son talked over the phone at 1300 and noticed that she was confused with garbled speech. Patient lives in assisted living.. [facility] recently discharged of [facility] with left cerebellum stroke and UTI. Patient deniess ha, SOB, visual disturbances. Weakness, she does c/o numbness/tingling on her both hands. Son at bedside during my evaluation and thinks she at baseline at time of evaluation. MRI done w/o acute abnormalities I was told by ER that patient had BP 150 that dropped to 90's Labs with hyponatremia. Creat 2/.32 from 0.70 0n 1/20   Past Medical History:  Diagnosis Date  . Arthritis    osteo  . COPD (chronic obstructive pulmonary disease) ([place])   . GERD (gastroesophageal reflux disease)   . Heart murmur   . Hernia   . Hyperlipidemia   . Hypertension   . Melanoma ([place])    face  . Syncope and collapse   . Urine incontinence     Past Surgical History:  Procedure Laterality Date  . ABDOMINAL HYSTERECTOMY    . AMPUTATION TOE Right 06/14/2018   Procedure: AMPUTATION TOE - RIGHT 5TH;  Surgeon: Albertine Patricia, DPM;  Location: [facility] ORS;  Service: Podiatry;  Laterality: Right;  . CHOLECYSTECTOMY    . HERNIA REPAIR      Family History  Problem Relation Age of Onset  . Stroke Father   . Heart disease Sister   . Cancer Brother        colon  . Heart disease Brother   . Cancer Sister        colon  . Heart attack Brother   . Heart attack Son   . Parkinson's disease Son     Social History:  reports that she has never smoked. She has never used smokeless tobacco. She reports that she does not drink alcohol or use drugs.  Allergies  Allergen Reactions  . Statins Other (See Comments)    Very bad muscle aches  . Tramadol  Itching  . Levaquin [Levofloxacin In D5w] Diarrhea and Other (See Comments)    Confusion, hallucinations  . Neomycin-Polymyxin-Gramicidin Other (See Comments)  . Penicillins     Did it involve swelling of the face/tongue/throat, SOB, or low BP? Unknown Did it involve sudden or severe rash/hives, skin peeling, or any reaction on the inside of your mouth or nose? Unknown Did you need to seek medical attention at a hospital or doctor's office? Unknown When did it last happen?Unknown If all above answers are "NO", may proceed with cephalosporin use.  . Sulfa Antibiotics   . Tetracyclines & Related     Medications: I have reviewed the patient's current medications.  ROS: As per HPI Physical Examination: Blood pressure (!) 104/48, pulse 67, temperature 98.9 F (37.2 C), resp. rate 16, height 5\' 3"  (1.6 m), weight 69.3 kg, SpO2 91 %.  Neurologic Examination Alert, awake, speech nle, oreinted x3,2, following commands PERLA, EOMI, no nystagmus, VFF, face symmetrical, face sensation totouch nle, uvula and tongue midline No motor defcit appreciated No sensory deficit appreciated No coordination deficit appreciated dtr and gait not checked at time of evaluation  Results for orders placed or performed during the hospital encounter of 02/27/19 (from the past 48 hour(s))  Ethanol     Status: None  Collection Time: 02/27/19  2:19 PM  Result Value Ref Range   Alcohol, Ethyl (B) <10 <10 mg/dL    Comment: (NOTE) Lowest detectable limit for serum alcohol is 10 mg/dL. For medical purposes only. Performed at [facility], [address] [place], [place] [address]   Protime-INR     Status: None   Collection Time: 02/27/19  2:19 PM  Result Value Ref Range   Prothrombin Time 12.4 11.4 - 15.2 seconds   INR 0.9 0.8 - 1.2    Comment: (NOTE) INR goal varies based on device and disease states. Performed at [facility], [address] [place], [place] [address]   APTT      Status: None   Collection Time: 02/27/19  2:19 PM  Result Value Ref Range   aPTT 29 24 - 36 seconds    Comment: Performed at [facility], [address] [place], [place] [address]  CBC     Status: None   Collection Time: 02/27/19  2:19 PM  Result Value Ref Range   WBC 8.7 4.0 - 10.5 K/uL   RBC 3.93 3.87 - 5.11 MIL/uL   Hemoglobin 12.5 12.0 - 15.0 g/dL   HCT 38.8 36.0 - 46.0 %   MCV 98.7 80.0 - 100.0 fL   MCH 31.8 26.0 - 34.0 pg   MCHC 32.2 30.0 - 36.0 g/dL   RDW 13.7 11.5 - 15.5 %   Platelets 271 150 - 400 K/uL   nRBC 0.0 0.0 - 0.2 %    Comment: Performed at [facility], [address] [place], [place] [address]  Differential     Status: None   Collection Time: 02/27/19  2:19 PM  Result Value Ref Range   Neutrophils Relative % 60 %   Neutro Abs 5.2 1.7 - 7.7 K/uL   Lymphocytes Relative 28 %   Lymphs Abs 2.4 0.7 - 4.0 K/uL   Monocytes Relative 9 %   Monocytes Absolute 0.8 0.1 - 1.0 K/uL   Eosinophils Relative 2 %   Eosinophils Absolute 0.2 0.0 - 0.5 K/uL   Basophils Relative 1 %   Basophils Absolute 0.1 0.0 - 0.1 K/uL   Immature Granulocytes 0 %   Abs Immature Granulocytes 0.03 0.00 - 0.07 K/uL    Comment: Performed at [facility], [address] [place], [place] [address]  Comprehensive metabolic panel     Status: Abnormal   Collection Time: 02/27/19  2:19 PM  Result Value Ref Range   Sodium 131 (L) 135 - 145 mmol/L   Potassium 4.8 3.5 - 5.1 mmol/L   Chloride 96 (L) 98 - 111 mmol/L   CO2 24 22 - 32 mmol/L   Glucose, Bld 102 (H) 70 - 99 mg/dL   BUN 46 (H) 8 - 23 mg/dL   Creatinine, Ser 2.32 (H) 0.44 - 1.00 mg/dL   Calcium 8.7 (L) 8.9 - 10.3 mg/dL   Total Protein 6.6 6.5 - 8.1 g/dL   Albumin 3.5 3.5 - 5.0 g/dL   AST 31 15 - 41 U/L   ALT 18 0 - 44 U/L   Alkaline Phosphatase 63 38 - 126 U/L   Total Bilirubin 0.7 0.3 - 1.2 mg/dL   GFR calc non Af Amer 17 (L) >60 mL/min   GFR calc Af Amer 20 (L) >60 mL/min   Anion gap 11 5 - 15     Comment: Performed at [facility], [address] [place],  [address]  SARS  Coronavirus 2 [facility] order, Performed in [facility] hospital lab) Nasopharyngeal Nasopharyngeal Swab     Status: None   Collection Time: 02/27/19  2:19 PM   Specimen: Nasopharyngeal Swab  Result Value Ref Range   SARS Coronavirus 2 NEGATIVE NEGATIVE    Comment: (NOTE) If result is NEGATIVE SARS-CoV-2 target nucleic acids are NOT DETECTED. The SARS-CoV-2 RNA is generally detectable in upper and lower  respiratory specimens during the acute phase of infection. The lowest  concentration of SARS-CoV-2 viral copies this assay can detect is 250  copies / mL. A negative result does not preclude SARS-CoV-2 infection  and should not be used as the sole basis for treatment or other  patient management decisions.  A negative result may occur with  improper specimen collection / handling, submission of specimen other  than nasopharyngeal swab, presence of viral mutation(s) within the  areas targeted by this assay, and inadequate number of viral copies  (<250 copies / mL). A negative result must be combined with clinical  observations, patient history, and epidemiological information. If result is POSITIVE SARS-CoV-2 target nucleic acids are DETECTED. The SARS-CoV-2 RNA is generally detectable in upper and lower  respiratory specimens dur ing the acute phase of infection.  Positive  results are indicative of active infection with SARS-CoV-2.  Clinical  correlation with patient history and other diagnostic information is  necessary to determine patient infection status.  Positive results do  not rule out bacterial infection or co-infection with other viruses. If result is PRESUMPTIVE POSTIVE SARS-CoV-2 nucleic acids MAY BE PRESENT.   A presumptive positive result was obtained on the submitted specimen  and confirmed on repeat testing.  While 2019 novel coronavirus  (SARS-CoV-2) nucleic acids may be  present in the submitted sample  additional confirmatory testing may be necessary for epidemiological  and / or clinical management purposes  to differentiate between  SARS-CoV-2 and other Sarbecovirus currently known to infect humans.  If clinically indicated additional testing with an alternate test  methodology [phone]) is advised. The SARS-CoV-2 RNA is generally  detectable in upper and lower respiratory sp ecimens during the acute  phase of infection. The expected result is Negative. Fact Sheet for Patients:  StrictlyIdeas.no Fact Sheet for Healthcare Providers: BankingDealers.co.za This test is not yet approved or cleared by the [place] FDA and has been authorized for detection and/or diagnosis of SARS-CoV-2 by FDA under an Emergency Use Authorization (EUA).  This EUA will remain in effect (meaning this test can be used) for the duration of the COVID-19 declaration under Section 564(b)(1) of the Act, 21 U.S.C. section 360bbb-3(b)(1), unless the authorization is terminated or revoked sooner. Performed at [facility], [address] [place], [place] [address]     Recent Results (from the past 240 hour(s))  Urine Culture     Status: Abnormal   Collection Time: 02/22/19  2:45 PM   Specimen: Urine, Clean Catch  Result Value Ref Range Status   Specimen Description   Final    URINE, CLEAN CATCH Performed at [facility], [address], [place], [place] [address]    Special Requests   Final    Normal Performed at [facility], [address] [place], [place] [address]    Culture >=100,000 COLONIES/mL PSEUDOMONAS AERUGINOSA (A)  Final   Report Status 02/25/2019 FINAL  Final   Organism ID, Bacteria PSEUDOMONAS AERUGINOSA (A)  Final      Susceptibility   Pseudomonas aeruginosa - MIC*  CEFTAZIDIME 4 SENSITIVE Sensitive     CIPROFLOXACIN <=0.25 SENSITIVE Sensitive     GENTAMICIN 2 SENSITIVE  Sensitive     IMIPENEM 2 SENSITIVE Sensitive     PIP/TAZO <=4 SENSITIVE Sensitive     CEFEPIME 2 SENSITIVE Sensitive     * >=100,000 COLONIES/mL PSEUDOMONAS AERUGINOSA  SARS Coronavirus 2 [facility] order, Performed in [facility] hospital lab) Nasopharyngeal Nasopharyngeal Swab     Status: None   Collection Time: 02/22/19  6:17 PM   Specimen: Nasopharyngeal Swab  Result Value Ref Range Status   SARS Coronavirus 2 NEGATIVE NEGATIVE Final    Comment: (NOTE) If result is NEGATIVE SARS-CoV-2 target nucleic acids are NOT DETECTED. The SARS-CoV-2 RNA is generally detectable in upper and lower  respiratory specimens during the acute phase of infection. The lowest  concentration of SARS-CoV-2 viral copies this assay can detect is 250  copies / mL. A negative result does not preclude SARS-CoV-2 infection  and should not be used as the sole basis for treatment or other  patient management decisions.  A negative result may occur with  improper specimen collection / handling, submission of specimen other  than nasopharyngeal swab, presence of viral mutation(s) within the  areas targeted by this assay, and inadequate number of viral copies  (<250 copies / mL). A negative result must be combined with clinical  observations, patient history, and epidemiological information. If result is POSITIVE SARS-CoV-2 target nucleic acids are DETECTED. The SARS-CoV-2 RNA is generally detectable in upper and lower  respiratory specimens dur ing the acute phase of infection.  Positive  results are indicative of active infection with SARS-CoV-2.  Clinical  correlation with patient history and other diagnostic information is  necessary to determine patient infection status.  Positive results do  not rule out bacterial infection or co-infection with other viruses. If result is PRESUMPTIVE POSTIVE SARS-CoV-2 nucleic acids MAY BE PRESENT.   A presumptive positive result was obtained on the submitted specimen  and  confirmed on repeat testing.  While 2019 novel coronavirus  (SARS-CoV-2) nucleic acids may be present in the submitted sample  additional confirmatory testing may be necessary for epidemiological  and / or clinical management purposes  to differentiate between  SARS-CoV-2 and other Sarbecovirus currently known to infect humans.  If clinically indicated additional testing with an alternate test  methodology [phone]) is advised. The SARS-CoV-2 RNA is generally  detectable in upper and lower respiratory sp ecimens during the acute  phase of infection. The expected result is Negative. Fact Sheet for Patients:  StrictlyIdeas.no Fact Sheet for Healthcare Providers: BankingDealers.co.za This test is not yet approved or cleared by the [place] FDA and has been authorized for detection and/or diagnosis of SARS-CoV-2 by FDA under an Emergency Use Authorization (EUA).  This EUA will remain in effect (meaning this test can be used) for the duration of the COVID-19 declaration under Section 564(b)(1) of the Act, 21 U.S.C. section 360bbb-3(b)(1), unless the authorization is terminated or revoked sooner. Performed at [facility], [address] [place], [place] [address]   SARS Coronavirus 2 [facility] order, Performed in [facility] hospital lab) Nasopharyngeal Nasopharyngeal Swab     Status: None   Collection Time: 02/27/19  2:19 PM   Specimen: Nasopharyngeal Swab  Result Value Ref Range Status   SARS Coronavirus 2 NEGATIVE NEGATIVE Final    Comment: (NOTE) If result is NEGATIVE SARS-CoV-2 target nucleic acids are NOT DETECTED. The SARS-CoV-2 RNA is generally detectable in upper and lower  respiratory specimens during the acute phase of infection. The lowest  concentration of SARS-CoV-2 viral copies this assay can detect is 250  copies / mL. A negative result does not preclude SARS-CoV-2 infection  and should not be used as the  sole basis for treatment or other  patient management decisions.  A negative result may occur with  improper specimen collection / handling, submission of specimen other  than nasopharyngeal swab, presence of viral mutation(s) within the  areas targeted by this assay, and inadequate number of viral copies  (<250 copies / mL). A negative result must be combined with clinical  observations, patient history, and epidemiological information. If result is POSITIVE SARS-CoV-2 target nucleic acids are DETECTED. The SARS-CoV-2 RNA is generally detectable in upper and lower  respiratory specimens dur ing the acute phase of infection.  Positive  results are indicative of active infection with SARS-CoV-2.  Clinical  correlation with patient history and other diagnostic information is  necessary to determine patient infection status.  Positive results do  not rule out bacterial infection or co-infection with other viruses. If result is PRESUMPTIVE POSTIVE SARS-CoV-2 nucleic acids MAY BE PRESENT.   A presumptive positive result was obtained on the submitted specimen  and confirmed on repeat testing.  While 2019 novel coronavirus  (SARS-CoV-2) nucleic acids may be present in the submitted sample  additional confirmatory testing may be necessary for epidemiological  and / or clinical management purposes  to differentiate between  SARS-CoV-2 and other Sarbecovirus currently known to infect humans.  If clinically indicated additional testing with an alternate test  methodology [phone]) is advised. The SARS-CoV-2 RNA is generally  detectable in upper and lower respiratory sp ecimens during the acute  phase of infection. The expected result is Negative. Fact Sheet for Patients:  StrictlyIdeas.no Fact Sheet for Healthcare Providers: BankingDealers.co.za This test is not yet approved or cleared by the [place] FDA and has been authorized for detection  and/or diagnosis of SARS-CoV-2 by FDA under an Emergency Use Authorization (EUA).  This EUA will remain in effect (meaning this test can be used) for the duration of the COVID-19 declaration under Section 564(b)(1) of the Act, 21 U.S.C. section 360bbb-3(b)(1), unless the authorization is terminated or revoked sooner. Performed at [facility], [address] [place], [place] [address]     No results found.   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 Neuro exam is benign. MRI w/o acute abnormalities. Patient now at baseline by her son. Confusion/garbled  Most likely related to metabolic/electrolytes abnlities, infection. medications and so forth althoughout sz in differentiel but less likely RECS: - Neuro portectives measures including normothermia, normoglycemia, correct electrolytes/metaboilci abnlities, hydration - freq neurochek, vitals per protocol - sz precautions - no AEDs for now unless she declares herself - obtain EEG but likely low yield - consider nephrology consult - remaining of treatment by primary team - PT/OT   02/27/2019, 4:05 PM

## 2019-02-27 NOTE — H&P (Signed)
[facility] [place] NAME: Cindy Graham    MR#:  [id number]  DATE OF BIRTH:  [birth year]  DATE OF ADMISSION:  02/27/2019  PRIMARY CARE PHYSICIAN: [facility]   REQUESTING/REFERRING PHYSICIAN: Altered mental status  CHIEF COMPLAINT: Dr. Joni Fears   Chief Complaint  Patient presents with  . Altered Mental Status    HISTORY OF PRESENT ILLNESS:  Cindy Graham  is a 85 y.o. 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 comes in because of confusion.  Patient apparently had episode of slurred speech, more confused.  Patient son is at bedside, states that she was doing okay after last discharge noted to be more confused since yesterday, he requested doctors making house calls, they noticed that she is more confused and having slurred speech, brought her here.  In the ER patient had MRI of the brain which she did not show acute stroke but blood work showed acute kidney injury. PAST MEDICAL HISTORY:   Past Medical History:  Diagnosis Date  . Arthritis    osteo  . COPD (chronic obstructive pulmonary disease) ([place])   . GERD (gastroesophageal reflux disease)   . Heart murmur   . Hernia   . Hyperlipidemia   . Hypertension   . Melanoma ([place])    face  . Syncope and collapse   . Urine incontinence     PAST SURGICAL HISTOIRY:   Past Surgical History:  Procedure Laterality Date  . ABDOMINAL HYSTERECTOMY    . AMPUTATION TOE Right 06/14/2018   Procedure: AMPUTATION TOE - RIGHT 5TH;  Surgeon: Albertine Patricia, DPM;  Location: [facility] ORS;  Service: Podiatry;  Laterality: Right;  . CHOLECYSTECTOMY    . HERNIA REPAIR      SOCIAL HISTORY:   Social History   Tobacco Use  . Smoking status: Never Smoker  . Smokeless tobacco: Never Used  Substance Use Topics  . Alcohol use: No    FAMILY  HISTORY:   Family History  Problem Relation Age of Onset  . Stroke Father   . Heart disease Sister   . Cancer Brother        colon  . Heart disease Brother   . Cancer Sister        colon  . Heart attack Brother   . Heart attack Son   . Parkinson's disease Son     DRUG ALLERGIES:   Allergies  Allergen Reactions  . Statins Other (See Comments)    Very bad muscle aches  . Tramadol Itching  . Levaquin [Levofloxacin In D5w] Diarrhea and Other (See Comments)    Confusion, hallucinations  . Neomycin-Polymyxin-Gramicidin Other (See Comments)  . Penicillins     Did it involve swelling of the face/tongue/throat, SOB, or low BP? Unknown Did it involve sudden or severe rash/hives, skin peeling, or any reaction on the inside of your mouth or nose? Unknown Did you need to seek medical attention at a hospital or doctor's office? Unknown When did it last happen?Unknown If all above answers are "NO", may proceed with cephalosporin use.  . Sulfa Antibiotics   . Tetracyclines & Related     REVIEW OF SYSTEMS:  Patient is still confused, unable to obtain review of systems, patient son is at bedside, says that she is usually not like this.  Patient recently was treated for UTI.Marland Kitchen   MEDICATIONS  AT HOME:   Prior to Admission medications   Medication Sig Start Date End Date Taking? Authorizing Provider  acetaminophen (TYLENOL) 325 MG tablet Take 650 mg by mouth 2 (two) times daily.   Yes [provider]  acetaminophen (TYLENOL) 500 MG tablet Take 500 mg by mouth every 6 (six) hours as needed for mild pain or moderate pain.    Yes [provider]  alendronate (FOSAMAX) 70 MG tablet Take 70 mg by mouth every Thursday.    Yes [provider]  alum & mag hydroxide-simeth (MINTOX) 200-200-20 MG/5ML suspension Take 30 mLs by mouth 3 (three) times daily as needed for indigestion or heartburn.    Yes [provider]  aspirin 81 MG chewable tablet Chew 81 mg by mouth  daily.   Yes [provider]  Biotin 1000 MCG tablet Take 1,000 mcg by mouth daily.     Yes [provider]  cephALEXin (KEFLEX) 250 MG capsule Take 1 capsule (250 mg total) by mouth every 12 (twelve) hours for 5 days. 02/24/19 03/01/19 Yes Dustin Flock, MD  enalapril (VASOTEC) 20 MG tablet TAKE ONE TABLET BY MOUTH EVERY DAY Patient taking differently: Take 20 mg by mouth daily.  09/15/17  Yes Leone Haven, MD  Flaxseed, Linseed, (FLAX SEED OIL) 1000 MG CAPS Take 1,000 mg by mouth daily.    Yes [provider]  fluticasone (FLOVENT HFA) 220 MCG/ACT inhaler Inhale 2 puffs into the lungs 2 (two) times daily. 08/12/17  Yes Leone Haven, MD  gabapentin (NEURONTIN) 300 MG capsule Take 1 capsule (300 mg total) by mouth at bedtime. Patient taking differently: Take 300 mg by mouth daily.  12/06/17  Yes Leone Haven, MD  guaifenesin (ROBITUSSIN) 100 MG/5ML syrup Take 200 mg by mouth every 6 (six) hours as needed for cough.    Yes [provider]  lactulose (CHRONULAC) 10 GM/15ML solution Take 30 mLs (20 g total) by mouth daily as needed for mild constipation. 12/06/17  Yes Leone Haven, MD  Lidocaine 4 % PTCH Apply 1 patch topically daily.   Yes [provider]  loperamide (IMODIUM) 2 MG capsule Take 2 mg by mouth 4 (four) times daily as needed for diarrhea or loose stools.    Yes [provider]  magnesium hydroxide (MILK OF MAGNESIA) 400 MG/5ML suspension Take 30 mLs by mouth daily as needed for mild constipation.   Yes [provider]  meloxicam (MOBIC) 15 MG tablet Take 15 mg by mouth daily.   Yes [provider]  Multiple Vitamin (DAILY VITES) tablet Take 1 tablet by mouth daily.   Yes [provider]  neomycin-bacitracin-polymyxin (NEOSPORIN) ointment Apply 1 application topically daily. Apply to skin tear on right calf   Yes [provider]  nitroGLYCERIN (NITROSTAT) 0.4 MG SL tablet Place  0.4 mg under the tongue every 5 (five) minutes as needed for chest pain.   Yes [provider]  nystatin cream (MYCOSTATIN) Apply 1 application topically as needed (panniculitis). Apply to pannus   Yes [provider]  omeprazole (PRILOSEC) 20 MG capsule TAKE 1 CAPSULE BY MOUTH EVERY DAY. Patient taking differently: Take 20 mg by mouth daily.  11/15/17  Yes Leone Haven, MD  oxybutynin (DITROPAN) 5 MG tablet TAKE ONE TABLET BY MOUTH EVERY DAY Patient taking differently: Take 5 mg by mouth daily.  09/15/17  Yes Leone Haven, MD  propranolol (INDERAL) 60 MG tablet Take 60 mg by mouth daily.  Yes [provider]  sertraline (ZOLOFT) 100 MG tablet TAKE ONE TABLET BY MOUTH EVERY DAY Patient taking differently: Take 100 mg by mouth daily.  11/15/17  Yes Leone Haven, MD  tapentadol (NUCYNTA) 50 MG tablet Take 50 mg by mouth 3 (three) times daily.   Yes [provider]  triamcinolone cream (KENALOG) 0.1 % Apply 1 application topically 2 (two) times daily.   Yes [provider]  triamterene-hydrochlorothiazide (DYAZIDE) 37.5-25 MG capsule Take 1 capsule by mouth daily. 05/04/18  Yes [provider]  trolamine salicylate (ASPERCREME) 10 % cream Apply 1 application topically 3 (three) times daily as needed for muscle pain.    Yes [provider]  verapamil (VERELAN PM) 240 MG 24 hr capsule Take 240 mg by mouth at bedtime.   Yes [provider]      VITAL SIGNS:  Blood pressure (!) 85/67, pulse 68, temperature 98.9 F (37.2 C), resp. rate 18, height 5\' 3"  (1.6 m), weight 69.3 kg, SpO2 93 %.  PHYSICAL EXAMINATION:  GENERAL:  85 y.o.-year-old patient lying in the bed with no acute distress.  Very hard of hearing.Albertine Grates appears very confused. EYES: Pupils equal, round, reactive to light a No scleral icterus. Extraocular muscles intact.  HEENT: Head atraumatic, normocephalic. Oropharynx and nasopharynx clear.  NECK:   Supple, no jugular venous distention. No thyroid enlargement, no tenderness.  LUNGS: Normal breath sounds bilaterally, no wheezing, rales,rhonchi or crepitation. No use of accessory muscles of respiration.  CARDIOVASCULAR: S1, S2 normal. No murmurs, rubs, or gallops.  ABDOMEN: Soft, nontender, nondistended. Bowel sounds present. No organomegaly or mass.  EXTREMITIES: No pedal edema, cyanosis, or clubbing.  NEUROLOGIC: CONFUSED UNABLE TO FOLLOW COMMANDS FOR FULL NEURO EXAM BUT GROSSLY APPEARS INTACT WITHOUT ANY NEUROLOGICAL DEFICIT.  PSYCHIATRIC: T very hard of hearing and also confused.   SKIN: No obvious rash, lesion, or ulcer.   LABORATORY PANEL:   CBC Recent Labs  Lab 02/27/19 1419  WBC 8.7  HGB 12.5  HCT 38.8  PLT 271   ------------------------------------------------------------------------------------------------------------------  Chemistries  Recent Labs  Lab 02/27/19 1419  NA 131*  K 4.8  CL 96*  CO2 24  GLUCOSE 102*  BUN 46*  CREATININE 2.32*  CALCIUM 8.7*  AST 31  ALT 18  ALKPHOS 63  BILITOT 0.7   ------------------------------------------------------------------------------------------------------------------  Cardiac Enzymes No results for input(s): TROPONINI in the last 168 hours. ------------------------------------------------------------------------------------------------------------------  RADIOLOGY:  Mr Brain 92 Contrast  Result Date: 02/27/2019 CLINICAL DATA:  85 year old female with symptoms of slurred speech and weakness. 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 EXAM: MRI HEAD WITHOUT CONTRAST TECHNIQUE: Multiplanar, multiecho pulse sequences of the brain and surrounding structures were obtained without intravenous contrast. COMPARISON:  Brain and cervical spine MRI 02/22/2019 and earlier. FINDINGS: Brain: Regressed punctate diffusion abnormality in the left cerebellum, with a corresponding  small T2 hyperintense area there in the left cerebellar hemisphere. No new restricted diffusion or evidence of acute infarction. No acute intracranial hemorrhage or mass effect. Calcified left anterior frontal convexity meningioma measuring about 14 millimeters diameter redemonstrated with no significant mass effect, and no associated cerebral edema. Otherwise normal for age gray and white matter signal. No cortical encephalomalacia or chronic cerebral blood products. No restricted diffusion to suggest acute infarction. No midline shift, mass effect, ventriculomegaly, extra-axial collection or acute intracranial hemorrhage. Pituitary within normal limits. Vascular: Major intracranial vascular flow voids are stable. Skull and upper cervical spine: Bulky degenerative ligamentous  hypertrophy about the odontoid resulting in mild cervicomedullary junction spinal stenosis. Otherwise negative visible cervical spine. Visualized bone marrow signal is within normal limits. Sinuses/Orbits: Stable and negative. Other: Mastoids remain clear. Grossly normal visible internal auditory structures. Negative scalp and face soft tissues. IMPRESSION: 1. No acute intracranial abnormality. 2. Fading signal abnormality associated with the recent small left cerebellar infarct. No intracranial hemorrhage or mass effect. 3. Stable small left anterior convexity meningioma without cerebral edema or significant mass effect. 4. Redemonstrated bulky degenerative ligamentous hypertrophy about the odontoid. Electronically Signed   By: Genevie Ann M.D.   On: 02/27/2019 16:08   Dg Chest Portable 1 View  Result Date: 02/27/2019 CLINICAL DATA:  Slurred speech. Slumped in chair. Patient has since returned to baseline. EXAM: PORTABLE CHEST 1 VIEW COMPARISON:  CT chest 11/03/2017.  Two-view chest x-ray 11/26/2015 FINDINGS: Heart is enlarged. This is exaggerated by low lung volumes. There is chronic elevation of the right hemidiaphragm. No edema or effusion  is present. No focal airspace disease is present. Advanced degenerative changes are noted in the shoulders bilaterally. IMPRESSION: 1. Low lung volumes. 2. Borderline cardiomegaly without failure. 3. Chronic elevation of the right hemidiaphragm. 4. Degenerative changes are again noted at the shoulders bilaterally. Electronically Signed   By: San Morelle M.D.   On: 02/27/2019 16:27    EKG:   Orders placed or performed during the hospital encounter of 02/27/19  . ED EKG  . ED EKG  . EKG 12-Lead  . EKG 12-Lead    IMPRESSION AND PLAN:   85 year old female with history of COPD, hypertension, recent UTI, recent stroke to left cerebellum comes in because of confusion, garbled speech as per family, now patient found to have acute kidney injury. #1 acute kidney injury secondary to poor p.o. intake, admitted to [facility], continue IV hydration, hold nephrotoxic agents including ACE inhibitors, HCTZ.,  Will consult nephrology for renal function does not improve by tomorrow. 2.  Recent history of left cerebellar stroke, MRI of the brain did not show new stroke, seen by neurologist, recommended neurochecks, vitals as per protocol, continue seizure precautions, and neurologist wanted EEG although suspicion is low. 3.  Generalized weakness, consult physical therapy.  All the records are reviewed and case discussed with ED provider. Management plans discussed with the patient, family and they are in agreement.  CODE STATUS: DNR, confirmed with patient's son about her DNR status.  TOTAL TIME TAKING CARE OF THIS PATIENT: 55 minutes.    Epifanio Lesches M.D on 02/27/2019 at 5:12 PM  Between 7am to 6pm - Pager - [phone]  After 6pm go to [email or web address] - password [email or web address] [facility]  Office  [phone]  CC: Primary care physician; [facility]  Note: This dictation was prepared with Dragon dictation along with smaller phrase  technology. Any transcriptional errors that result from this process are unintentional.

## 2019-02-27 NOTE — ED Provider Notes (Signed)
[facility] Emergency Department Provider Note  ____________________________________________  Time seen: Approximately 3:42 PM  I have reviewed the triage vital signs and the nursing notes.   HISTORY  Chief Complaint Altered Mental Status    Level 5 Caveat: Portions of the History and Physical including HPI and review of systems are unable to be completely obtained due to patient being a poor historian   HPI Cindy Graham is a 85 y.o. female with a history of COPD GERD hypertension and recent stroke who was sent to the ED from [place] due to having an apparent syncopal episode in her room, found to have slurred speech and reporting right-sided tingling.  While recently in the hospital she had an MRI which was positive for cerebellar infarct.  Her aspirin was increased to 325 daily.  She was started on Keflex for UTI.  When EMS arrived, staff [facility] reported the patient was back to normal but her son who is currently at bedside says that she still is weak and fatigued compared to her usual self and also confused which is not normal for her.      Past Medical History:  Diagnosis Date  . Arthritis    osteo  . COPD (chronic obstructive pulmonary disease) ([place])   . GERD (gastroesophageal reflux disease)   . Heart murmur   . Hernia   . Hyperlipidemia   . Hypertension   . Melanoma ([place])    face  . Syncope and collapse   . Urine incontinence      Patient Active Problem List   Diagnosis Date Noted  . Stroke (cerebrum) ([place]) 02/22/2019  . Cellulitis 06/12/2018  . Hyponatremia 11/24/2017  . Headache 11/24/2017  . Wound of left leg 11/01/2017  . Constipation 09/05/2017  . Other fatigue 12/22/2016  . Tremor 03/29/2016  . Amaurosis fugax 12/25/2015  . Asthma 09/17/2015  . PVD (peripheral vascular disease) ([place]) 05/28/2015  . B12 deficiency 08/29/2014  . Chronic back pain greater than 3 months duration 04/23/2013  . Chronic kidney  disease (CKD), stage III (moderate) 04/12/2012  . Hyperlipidemia LDL goal < 100 03/05/2012  . UTI (urinary tract infection) 07/26/2011  . Osteoarthritis 07/06/2011  . Hypertension 02/21/2011  . Depression/Anxiety 02/21/2011     Past Surgical History:  Procedure Laterality Date  . ABDOMINAL HYSTERECTOMY    . AMPUTATION TOE Right 06/14/2018   Procedure: AMPUTATION TOE - RIGHT 5TH;  Surgeon: Albertine Patricia, DPM;  Location: [facility] ORS;  Service: Podiatry;  Laterality: Right;  . CHOLECYSTECTOMY    . HERNIA REPAIR       Prior to Admission medications   Medication Sig Start Date End Date Taking? Authorizing Provider  acetaminophen (TYLENOL) 325 MG tablet Take 650 mg by mouth 2 (two) times daily.    [provider]  acetaminophen (TYLENOL) 500 MG tablet Take 500 mg by mouth every 6 (six) hours as needed for mild pain or moderate pain.     [provider]  alendronate (FOSAMAX) 70 MG tablet Take 70 mg by mouth every Thursday.     [provider]  alum & mag hydroxide-simeth (MINTOX) 200-200-20 MG/5ML suspension Take 30 mLs by mouth 3 (three) times daily as needed for indigestion or heartburn.     [provider]  aspirin 81 MG chewable tablet Chew 81 mg by mouth daily.    [provider]  Biotin 1000 MCG tablet Take 1,000 mcg by mouth daily.      [provider]  cephALEXin (KEFLEX) 250 MG capsule Take 1 capsule (250 mg total) by mouth every 12 (twelve) hours for 5 days. 02/24/19 03/01/19  Dustin Flock, MD  enalapril (VASOTEC) 20 MG tablet TAKE ONE TABLET BY MOUTH EVERY DAY Patient taking differently: Take 20 mg by mouth daily.  09/15/17   Leone Haven, MD  Flaxseed, Linseed, (FLAX SEED OIL) 1000 MG CAPS Take 1,000 mg by mouth daily.     [provider]  fluticasone (FLOVENT HFA) 220 MCG/ACT inhaler Inhale 2 puffs into the lungs 2 (two) times daily. 08/12/17   Leone Haven, MD  gabapentin (NEURONTIN) 300 MG capsule Take 1  capsule (300 mg total) by mouth at bedtime. Patient taking differently: Take 300 mg by mouth daily.  12/06/17   Leone Haven, MD  guaifenesin (ROBITUSSIN) 100 MG/5ML syrup Take 200 mg by mouth every 6 (six) hours as needed for cough.     [provider]  lactulose (CHRONULAC) 10 GM/15ML solution Take 30 mLs (20 g total) by mouth daily as needed for mild constipation. 12/06/17   Leone Haven, MD  Lidocaine 4 % PTCH Apply 1 patch topically daily.    [provider]  loperamide (IMODIUM) 2 MG capsule Take 2 mg by mouth 4 (four) times daily as needed for diarrhea or loose stools.     [provider]  magnesium hydroxide (MILK OF MAGNESIA) 400 MG/5ML suspension Take 30 mLs by mouth daily as needed for mild constipation.    [provider]  Multiple Vitamin (DAILY VITES) tablet Take 1 tablet by mouth daily.    [provider]  neomycin-bacitracin-polymyxin (NEOSPORIN) ointment Apply 1 application topically daily. Apply to skin tear on right calf    [provider]  nitroGLYCERIN (NITROSTAT) 0.4 MG SL tablet Place 0.4 mg under the tongue every 5 (five) minutes as needed for chest pain.    [provider]  nystatin cream (MYCOSTATIN) Apply 1 application topically as needed (panniculitis). Apply to pannus    [provider]  omeprazole (PRILOSEC) 20 MG capsule TAKE 1 CAPSULE BY MOUTH EVERY DAY. Patient taking differently: Take 20 mg by mouth daily.  11/15/17   Leone Haven, MD  oxybutynin (DITROPAN) 5 MG tablet TAKE ONE TABLET BY MOUTH EVERY DAY Patient taking differently: Take 5 mg by mouth daily.  09/15/17   Leone Haven, MD  propranolol (INDERAL) 60 MG tablet Take 60 mg by mouth daily.    [provider]  sertraline (ZOLOFT) 100 MG tablet TAKE ONE TABLET BY MOUTH EVERY DAY Patient taking differently: Take 100 mg by mouth daily.  11/15/17   Leone Haven, MD  tapentadol (NUCYNTA) 50 MG tablet Take 50  mg by mouth 3 (three) times daily.    [provider]  triamterene-hydrochlorothiazide (DYAZIDE) 37.5-25 MG capsule Take 1 capsule by mouth daily. 05/04/18   [provider]  trolamine salicylate (ASPERCREME) 10 % cream Apply 1 application topically 3 (three) times daily as needed for muscle pain.     [provider]  verapamil (VERELAN PM) 240 MG 24 hr capsule Take 240 mg by mouth at bedtime.    [provider]     Allergies Statins, Tramadol, Levaquin [levofloxacin in d5w], Neomycin-polymyxin-gramicidin, Penicillins, Sulfa antibiotics, and Tetracyclines & related   Family History  Problem Relation Age of Onset  . Stroke Father   . Heart disease Sister   . Cancer Brother        colon  . Heart  disease Brother   . Cancer Sister        colon  . Heart attack Brother   . Heart attack Son   . Parkinson's disease Son     Social History Social History   Tobacco Use  . Smoking status: Never Smoker  . Smokeless tobacco: Never Used  Substance Use Topics  . Alcohol use: No  . Drug use: No    Review of Systems Level 5 Caveat: Portions of the History and Physical including HPI and review of systems are unable to be completely obtained due to patient being a poor historian   Constitutional:   No known fever.  ENT:   No rhinorrhea. Cardiovascular:   No chest pain or syncope. Respiratory:   No dyspnea or cough. Gastrointestinal:   Negative for abdominal pain, vomiting and diarrhea.  Musculoskeletal:   Negative for focal pain or swelling ____________________________________________   PHYSICAL EXAM:  VITAL SIGNS: ED Triage Vitals  Enc Vitals Group     BP 02/27/19 1414 (!) 104/48     Pulse Rate 02/27/19 1414 67     Resp 02/27/19 1414 16     Temp 02/27/19 1414 98.9 F (37.2 C)     Temp src --      SpO2 02/27/19 1414 91 %     Weight 02/27/19 1416 152 lb 12.5 oz (69.3 kg)     Height 02/27/19 1416 5\' 3"  (1.6 m)     Head Circumference --       Peak Flow --      Pain Score 02/27/19 1416 0     Pain Loc --      Pain Edu? --      Excl. in [place]? --     Vital signs reviewed, nursing assessments reviewed.   Constitutional:   Alert and oriented to self. Non-toxic appearance. Eyes:   Conjunctivae are normal. EOMI. PERRL. ENT      Head:   Normocephalic and atraumatic.      Nose:   Wearing a mask.       Mouth/Throat: Dry mucous membranes      Neck:   No meningismus. Full ROM. Hematological/Lymphatic/Immunilogical:   No cervical lymphadenopathy. Cardiovascular:   RRR. Symmetric bilateral radial and DP pulses.  No murmurs. Cap refill less than 2 seconds. Respiratory:   Normal respiratory effort without tachypnea/retractions. Breath sounds are clear and equal bilaterally. No wheezes/rales/rhonchi. Gastrointestinal:   Soft and nontender. Non distended. There is no CVA tenderness.  No rebound, rigidity, or guarding.  Musculoskeletal:   Normal range of motion in all extremities. No joint effusions.  No lower extremity tenderness.  No edema. Neurologic:   Normal speech and language.  Motor grossly intact. No pronator drift Normal finger-to-nose No acute focal neurologic deficits are appreciated.  Skin:    Skin is warm, dry and intact. No rash noted.  No petechiae, purpura, or bullae.  Poor skin turgor  ____________________________________________    LABS (pertinent positives/negatives) (all labs ordered are listed, but only abnormal results are displayed) Labs Reviewed  COMPREHENSIVE METABOLIC PANEL - Abnormal; Notable for the following components:      Result Value   Sodium 131 (*)    Chloride 96 (*)    Glucose, Bld 102 (*)    BUN 46 (*)    Creatinine, Ser 2.32 (*)    Calcium 8.7 (*)    GFR calc non Af Amer 17 (*)    GFR calc Af Amer 20 (*)    All  other components within normal limits  SARS CORONAVIRUS 2 (HOSPITAL ORDER, [place] [facility])  ETHANOL  PROTIME-INR  APTT  CBC  DIFFERENTIAL  URINE DRUG  SCREEN, QUALITATIVE ([facility] ONLY)  URINALYSIS, COMPLETE (UACMP) WITH MICROSCOPIC  PROCALCITONIN   ____________________________________________   EKG    ____________________________________________    RADIOLOGY  No results found.  ____________________________________________   PROCEDURES Procedures  ____________________________________________  DIFFERENTIAL DIAGNOSIS   Recurrent stroke, stroke recrudescence, dehydration, electrolyte abnormality  CLINICAL IMPRESSION / ASSESSMENT AND PLAN / ED COURSE  Medications ordered in the ED: Medications  sodium chloride 0.9 % bolus 1,000 mL (has no administration in time range)    Pertinent labs & imaging results that were available during my care of the patient were reviewed by me and considered in my medical decision making (see chart for details).   Cindy Graham was evaluated in Emergency Department on 02/27/2019 for the symptoms described in the history of present illness. She was evaluated in the context of the global COVID-19 pandemic, which necessitated consideration that the patient might be at risk for infection with the SARS-CoV-2 virus that causes COVID-19. Institutional protocols and algorithms that pertain to the evaluation of patients at risk for COVID-19 are in a state of rapid change based on information released by regulatory bodies including the CDC and federal and state organizations. These policies and algorithms were followed during the patient's care in the ED.   Patient presents with acute neurologic deficit and confusion.  This may be due to delirium from dehydration resulting in hypotension, treatment for UTI, or medication changes.  She is not septic, and her blood pressure of 90/50 is due to dehydration based on exam and AKI on labs.  With her recent stroke and age, she is not a candidate for TPA.  I consulted the neurologist for recommendations based on her recent stroke and acute neurologic symptoms.  My  neurologic exam is nonfocal.  I ordered a stat MRI brain to further evaluate.  Discussed with the hospitalist for treatment of her confusion and AKI..      ____________________________________________   FINAL CLINICAL IMPRESSION(S) / ED DIAGNOSES    Final diagnoses:  Confusion  AKI (acute kidney injury) [facility])     ED Discharge Orders    None      Portions of this note were generated with dragon dictation software. Dictation errors may occur despite best attempts at proofreading.   Carrie Mew, MD 02/27/19 [phone]

## 2019-02-28 LAB — CBC
HCT: 35.4 % — ABNORMAL LOW (ref 36.0–46.0)
Hemoglobin: 11.3 g/dL — ABNORMAL LOW (ref 12.0–15.0)
MCH: 31.5 pg (ref 26.0–34.0)
MCHC: 31.9 g/dL (ref 30.0–36.0)
MCV: 98.6 fL (ref 80.0–100.0)
Platelets: 246 10*3/uL (ref 150–400)
RBC: 3.59 MIL/uL — ABNORMAL LOW (ref 3.87–5.11)
RDW: 13.7 % (ref 11.5–15.5)
WBC: 6.4 10*3/uL (ref 4.0–10.5)
nRBC: 0 % (ref 0.0–0.2)

## 2019-02-28 LAB — BASIC METABOLIC PANEL
Anion gap: 9 (ref 5–15)
BUN: 44 mg/dL — ABNORMAL HIGH (ref 8–23)
CO2: 22 mmol/L (ref 22–32)
Calcium: 8.3 mg/dL — ABNORMAL LOW (ref 8.9–10.3)
Chloride: 103 mmol/L (ref 98–111)
Creatinine, Ser: 1.65 mg/dL — ABNORMAL HIGH (ref 0.44–1.00)
GFR calc Af Amer: 30 mL/min — ABNORMAL LOW (ref >60)
GFR calc non Af Amer: 26 mL/min — ABNORMAL LOW (ref >60)
Glucose, Bld: 88 mg/dL (ref 70–99)
Potassium: 4.2 mmol/L (ref 3.5–5.1)
Sodium: 134 mmol/L — ABNORMAL LOW (ref 135–145)

## 2019-02-28 LAB — URINE DRUG SCREEN, QUALITATIVE (ARMC ONLY)
Amphetamines, Ur Screen: NOT DETECTED
Barbiturates, Ur Screen: NOT DETECTED
Benzodiazepine, Ur Scrn: NOT DETECTED
Cannabinoid 50 Ng, Ur ~~LOC~~: NOT DETECTED
Cocaine Metabolite,Ur ~~LOC~~: NOT DETECTED
MDMA (Ecstasy)Ur Screen: NOT DETECTED
Methadone Scn, Ur: POSITIVE — AB
Opiate, Ur Screen: POSITIVE — AB
Phencyclidine (PCP) Ur S: NOT DETECTED
Tricyclic, Ur Screen: NOT DETECTED

## 2019-02-28 LAB — GLUCOSE, CAPILLARY: Glucose-Capillary: 91 mg/dL (ref 70–99)

## 2019-02-28 MED ORDER — TAPENTADOL HCL 50 MG PO TABS: 50.0000 mg | ORAL_TABLET | Freq: Three times a day (TID) | ORAL | Status: DC

## 2019-02-28 MED ORDER — SODIUM CHLORIDE 0.9 % IV SOLN
1.0000 g | INTRAVENOUS | Status: DC
  Administered 2019-02-28: 1 g via INTRAVENOUS
  Filled 2019-02-28 (×2): qty 1

## 2019-02-28 MED ORDER — OXYCODONE HCL 5 MG PO TABS
5.0000 mg | ORAL_TABLET | Freq: Four times a day (QID) | ORAL | Status: DC | PRN
  Administered 2019-02-28: 5 mg via ORAL
  Filled 2019-02-28: qty 1

## 2019-02-28 NOTE — Progress Notes (Signed)
[place] [facility] [place] NAME: Cindy Graham    MR#:  [id number]  DATE OF BIRTH:  04-05-1922  SUBJECTIVE:   Patient presented to the hospital secondary to altered mental status.  Mental status much improved and close to baseline now.  Noted to have acute kidney injury which is also improving.  REVIEW OF SYSTEMS:    Review of Systems  Constitutional: Negative for chills and fever.  HENT: Negative for congestion and tinnitus.   Eyes: Negative for blurred vision and double vision.  Respiratory: Negative for cough, shortness of breath and wheezing.   Cardiovascular: Negative for chest pain, orthopnea and PND.  Gastrointestinal: Negative for abdominal pain, diarrhea, nausea and vomiting.  Genitourinary: Negative for dysuria and hematuria.  Neurological: Positive for weakness (generalized). Negative for dizziness, sensory change and focal weakness.  All other systems reviewed and are negative.    Nutrition: Soft diet Tolerating Diet: yes Tolerating PT: Eval noted.    DRUG ALLERGIES:   Allergies  Allergen Reactions  . Statins Other (See Comments)    Very bad muscle aches  . Tramadol Itching  . Levaquin [Levofloxacin In D5w] Diarrhea and Other (See Comments)    Confusion, hallucinations  . Neomycin-Polymyxin-Gramicidin Other (See Comments)  . Penicillins     Did it involve swelling of the face/tongue/throat, SOB, or low BP? Unknown Did it involve sudden or severe rash/hives, skin peeling, or any reaction on the inside of your mouth or nose? Unknown Did you need to seek medical attention at a hospital or doctor's office? Unknown When did it last happen?Unknown If all above answers are "NO", may proceed with cephalosporin use.  . Sulfa Antibiotics   . Tetracyclines & Related     VITALS:  Blood pressure (!) 126/52, pulse 73, temperature 98 F (36.7 C), resp. rate 17, height 5\' 3"  (1.6 m), weight 83.9 kg, SpO2 93 %.  PHYSICAL  EXAMINATION:   Physical Exam  GENERAL:  85 y.o.-year-old patient lying in bed in no acute distress but very hard of hearing. EYES: Pupils equal, round, reactive to light and accommodation. No scleral icterus. Extraocular muscles intact.  HEENT: Head atraumatic, normocephalic. Oropharynx and nasopharynx clear.  NECK:  Supple, no jugular venous distention. No thyroid enlargement, no tenderness.  LUNGS: Normal breath sounds bilaterally, no wheezing, rales, rhonchi. No use of accessory muscles of respiration.  CARDIOVASCULAR: S1, S2 normal. No murmurs, rubs, or gallops.  ABDOMEN: Soft, nontender, nondistended. Bowel sounds present. No organomegaly or mass.  EXTREMITIES: No cyanosis, clubbing or edema b/l.    NEUROLOGIC: Cranial nerves II through XII are intact. No focal Motor or sensory deficits b/l. Globally weak   PSYCHIATRIC: The patient is alert and oriented x 3.  SKIN: No obvious rash, lesion, or ulcer.    LABORATORY PANEL:   CBC Recent Labs  Lab 02/28/19 0514  WBC 6.4  HGB 11.3*  HCT 35.4*  PLT 246   ------------------------------------------------------------------------------------------------------------------  Chemistries  Recent Labs  Lab 02/27/19 1419 02/28/19 0514  NA 131* 134*  K 4.8 4.2  CL 96* 103  CO2 24 22  GLUCOSE 102* 88  BUN 46* 44*  CREATININE 2.32* 1.65*  CALCIUM 8.7* 8.3*  AST 31  --   ALT 18  --   ALKPHOS 63  --   BILITOT 0.7  --    ------------------------------------------------------------------------------------------------------------------  Cardiac Enzymes No results for input(s): TROPONINI in the last 168 hours. ------------------------------------------------------------------------------------------------------------------  RADIOLOGY:  Mr Brain Wo Contrast  Result Date: 02/27/2019 CLINICAL DATA:  85 year old female with symptoms of slurred speech and weakness. Recent headache, and small acute infarct in the left cerebellum  diagnosed on brain MRI 02/22/2019. EXAM: MRI HEAD WITHOUT CONTRAST TECHNIQUE: Multiplanar, multiecho pulse sequences of the brain and surrounding structures were obtained without intravenous contrast. COMPARISON:  Brain and cervical spine MRI 02/22/2019 and earlier. FINDINGS: Brain: Regressed punctate diffusion abnormality in the left cerebellum, with a corresponding small T2 hyperintense area there in the left cerebellar hemisphere. No new restricted diffusion or evidence of acute infarction. No acute intracranial hemorrhage or mass effect. Calcified left anterior frontal convexity meningioma measuring about 14 millimeters diameter redemonstrated with no significant mass effect, and no associated cerebral edema. Otherwise normal for age gray and white matter signal. No cortical encephalomalacia or chronic cerebral blood products. No restricted diffusion to suggest acute infarction. No midline shift, mass effect, ventriculomegaly, extra-axial collection or acute intracranial hemorrhage. Pituitary within normal limits. Vascular: Major intracranial vascular flow voids are stable. Skull and upper cervical spine: Bulky degenerative ligamentous hypertrophy about the odontoid resulting in mild cervicomedullary junction spinal stenosis. Otherwise negative visible cervical spine. Visualized bone marrow signal is within normal limits. Sinuses/Orbits: Stable and negative. Other: Mastoids remain clear. Grossly normal visible internal auditory structures. Negative scalp and face soft tissues. IMPRESSION: 1. No acute intracranial abnormality. 2. Fading signal abnormality associated with the recent small left cerebellar infarct. No intracranial hemorrhage or mass effect. 3. Stable small left anterior convexity meningioma without cerebral edema or significant mass effect. 4. Redemonstrated bulky degenerative ligamentous hypertrophy about the odontoid. Electronically Signed   By: Genevie Ann M.D.   On: 02/27/2019 16:08   Dg Chest  Portable 1 View  Result Date: 02/27/2019 CLINICAL DATA:  Slurred speech. Slumped in chair. Patient has since returned to baseline. EXAM: PORTABLE CHEST 1 VIEW COMPARISON:  CT chest 11/03/2017.  Two-view chest x-ray 11/26/2015 FINDINGS: Heart is enlarged. This is exaggerated by low lung volumes. There is chronic elevation of the right hemidiaphragm. No edema or effusion is present. No focal airspace disease is present. Advanced degenerative changes are noted in the shoulders bilaterally. IMPRESSION: 1. Low lung volumes. 2. Borderline cardiomegaly without failure. 3. Chronic elevation of the right hemidiaphragm. 4. Degenerative changes are again noted at the shoulders bilaterally. Electronically Signed   By: San Morelle M.D.   On: 02/27/2019 16:27     ASSESSMENT AND PLAN:   85 year old female with past medical history of hypertension, hyperlipidemia, previous history of syncope, GERD, COPD, osteoarthritis, urinary incontinence who presented to the hospital secondary to altered mental status.  1.  Altered mental status-likely metabolic encephalopathy secondary to acute on chronic renal failure combined with underlying UTI. - We will hydrate the patient with IV fluids, creatinine is improving.  Patient had a urine culture which was positive for Pseudomonas on her previous hospitalization.  Her urinalysis here is still positive.  We will treat the patient IV cefepime. -Follow mental status which is already improved.  CT head was negative admission.  2.  Urinary tract infection-based off a urinalysis on admission.  Her previous urine cultures grew Pseudomonas. - Treat the patient with IV cefepime, repeat urine cultures.  3.  Acute on chronic renal failure-secondary to dehydration and suspected UTI. - Hydrate the patient with IV fluids, creatinine is improving.  Hold patient's enalapril, triamterene, HCTZ for now.  4.  Depression-continue Zoloft.  5.  Essential hypertension-continue  verapamil, hold triamterene/HCTZ given acute kidney injury. -Continue propranolol.  6.  Osteoarthritis-continue  meloxicam.   All the records are reviewed and case discussed with Care Management/Social Worker. Management plans discussed with the patient, family and they are in agreement.  CODE STATUS: DNR  DVT Prophylaxis: Hep SQ  TOTAL TIME TAKING CARE OF THIS PATIENT: 30 minutes.   POSSIBLE D/C IN 1-2 DAYS, DEPENDING ON CLINICAL CONDITION.   Henreitta Leber M.D on 02/28/2019 at 2:58 PM  Between 7am to 6pm - Pager - [phone]  After 6pm go to [email or web address] - [employer] [facility]  Office  [phone]  CC: Primary care physician; [facility]
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
## 2019-03-01 LAB — BASIC METABOLIC PANEL
Anion gap: 4 — ABNORMAL LOW (ref 5–15)
BUN: 29 mg/dL — ABNORMAL HIGH (ref 8–23)
CO2: 23 mmol/L (ref 22–32)
Calcium: 8.3 mg/dL — ABNORMAL LOW (ref 8.9–10.3)
Chloride: 109 mmol/L (ref 98–111)
Creatinine, Ser: 1 mg/dL (ref 0.44–1.00)
GFR calc Af Amer: 55 mL/min — ABNORMAL LOW (ref >60)
GFR calc non Af Amer: 48 mL/min — ABNORMAL LOW (ref >60)
Glucose, Bld: 83 mg/dL (ref 70–99)
Potassium: 4.2 mmol/L (ref 3.5–5.1)
Sodium: 136 mmol/L (ref 135–145)

## 2019-03-01 LAB — GLUCOSE, CAPILLARY: Glucose-Capillary: 80 mg/dL (ref 70–99)

## 2019-03-01 MED ORDER — SODIUM CHLORIDE 0.9 % IV SOLN
2.0000 g | INTRAVENOUS | Status: DC
  Administered 2019-03-01: 2 g via INTRAVENOUS
  Filled 2019-03-01 (×2): qty 2

## 2019-03-01 MED ORDER — CIPROFLOXACIN HCL 250 MG PO TABS: 250.0000 mg | ORAL_TABLET | Freq: Two times a day (BID) | ORAL | 0 refills | Status: AC

## 2019-03-01 MED ORDER — SODIUM CHLORIDE 0.9 % IV SOLN
INTRAVENOUS | Status: DC | PRN
  Administered 2019-03-01: 1000 mL via INTRAVENOUS

## 2019-03-01 NOTE — TOC Transition Note (Signed)
Transition of Care [facility]) - CM/SW Discharge Note   Patient Details  Name: Cindy Graham MRN: [id number] Date of Birth: [birth year]  Transition of Care [facility]) CM/SW Contact:  Beverly Sessions, RN Phone Number: 03/01/2019, 4:08 PM   Clinical Narrative:    Patient returned to [place] today [place] from [place] confirmed that patient could return  [facility] faxed PT orders, covid results, and fl2.  [facility] will arrange PT.  Son transported at discharge.    Final next level of care: Assisted Living Barriers to Discharge: No Barriers Identified   Patient Goals and CMS Choice        Discharge Placement                       Discharge Plan and Services                          HH Arranged: PT          Social Determinants of Health (SDOH) Interventions     Readmission Risk Interventions No flowsheet data found.

## 2019-03-01 NOTE — Discharge Summary (Signed)
[facility] [place] NAME: Cindy Graham    MR#:  [id number]  DATE OF BIRTH:  1921-09-09  DATE OF ADMISSION:  02/27/2019 ADMITTING PHYSICIAN: Epifanio Lesches, MD  DATE OF DISCHARGE: 03/01/2019  PRIMARY CARE PHYSICIAN: [facility]    ADMISSION DIAGNOSIS:  Confusion [R41.0] AKI (acute kidney injury) ([place]) [N17.9]  DISCHARGE DIAGNOSIS:  Active Problems:   Acute kidney injury (AKI) with acute tubular necrosis (ATN) ([place])   SECONDARY DIAGNOSIS:   Past Medical History:  Diagnosis Date  . Arthritis    osteo  . COPD (chronic obstructive pulmonary disease) ([place])   . GERD (gastroesophageal reflux disease)   . Heart murmur   . Hernia   . Hyperlipidemia   . Hypertension   . Melanoma ([place])    face  . Syncope and collapse   . Urine incontinence     HOSPITAL COURSE:   85 year old female with past medical history of hypertension, hyperlipidemia, previous history of syncope, GERD, COPD, osteoarthritis, urinary incontinence who presented to the hospital secondary to altered mental status.  1.  Altered mental status-likely metabolic encephalopathy secondary to acute on chronic renal failure combined with underlying UTI. -Patient was hydrated with IV fluids and given IV antibiotics for the UTI. -Mental status is much improved and is back to baseline.  Patient CT head was negative admission. -Creatinine is back to baseline, urine and blood cultures remain negative.  2.  Urinary tract infection-based off a urinalysis on admission.  Her previous urine cultures grew Pseudomonas. -She was afebrile but since she presented with altered mental status she was treated with IV antibiotics with cefepime.  She is now being discharged on oral Cipro for additional 3 more days.  She is afebrile and stable.  3.  Acute on chronic renal failure-secondary to dehydration and suspected UTI. Patient was hydrated IV fluids and creatinine has  improved and is now back to baseline.  I will resume patient's enalapril but hold her triamterene HCTZ and discontinued for now.  4.  Depression- she will continue Zoloft.  5.  Essential hypertension- pt. Will continue verapamil, propranolol, Vasotec.  - d/c Triamterene/HCTZ for now.   6.  Osteoarthritis-pt. Will continue meloxicam.  Patient was seen by physical therapy and they recommend home health services.  Patient is being set up for home health PT at her assisted living.  She is stable for discharge today.  DISCHARGE CONDITIONS:   Stable  CONSULTS OBTAINED:  Treatment Team:  Neville Route, MD  DRUG ALLERGIES:   Allergies  Allergen Reactions  . Statins Other (See Comments)    Very bad muscle aches  . Tramadol Itching  . Levaquin [Levofloxacin In D5w] Diarrhea and Other (See Comments)    Confusion, hallucinations  . Neomycin-Polymyxin-Gramicidin Other (See Comments)  . Penicillins     Did it involve swelling of the face/tongue/throat, SOB, or low BP? Unknown Did it involve sudden or severe rash/hives, skin peeling, or any reaction on the inside of your mouth or nose? Unknown Did you need to seek medical attention at a hospital or doctor's office? Unknown When did it last happen?Unknown If all above answers are "NO", may proceed with cephalosporin use.  . Sulfa Antibiotics   . Tetracyclines & Related     DISCHARGE MEDICATIONS:   Allergies as of 03/01/2019      Reactions   Statins Other (See Comments)   Very bad muscle aches   Tramadol Itching   Levaquin [levofloxacin In D5w] Diarrhea,  Other (See Comments)   Confusion, hallucinations   Neomycin-polymyxin-gramicidin Other (See Comments)   Penicillins    Did it involve swelling of the face/tongue/throat, SOB, or low BP? Unknown Did it involve sudden or severe rash/hives, skin peeling, or any reaction on the inside of your mouth or nose? Unknown Did you need to seek medical attention at a hospital or doctor's  office? Unknown When did it last happen?Unknown If all above answers are "NO", may proceed with cephalosporin use.   Sulfa Antibiotics    Tetracyclines & Related       Medication List    STOP taking these medications   cephALEXin 250 MG capsule Commonly known as: KEFLEX   triamterene-hydrochlorothiazide 37.5-25 MG capsule Commonly known as: DYAZIDE     TAKE these medications   acetaminophen 500 MG tablet Commonly known as: TYLENOL Take 500 mg by mouth every 6 (six) hours as needed for mild pain or moderate pain.   acetaminophen 325 MG tablet Commonly known as: TYLENOL Take 650 mg by mouth 2 (two) times daily.   alendronate 70 MG tablet Commonly known as: FOSAMAX Take 70 mg by mouth every Thursday.   aspirin 81 MG chewable tablet Chew 81 mg by mouth daily.   Biotin 1000 MCG tablet Take 1,000 mcg by mouth daily.   ciprofloxacin 250 MG tablet Commonly known as: CIPRO Take 1 tablet (250 mg total) by mouth 2 (two) times daily for 3 days.   Daily Vites tablet Take 1 tablet by mouth daily.   enalapril 20 MG tablet Commonly known as: VASOTEC TAKE ONE TABLET BY MOUTH EVERY DAY   Flax Seed Oil 1000 MG Caps Take 1,000 mg by mouth daily.   fluticasone 220 MCG/ACT inhaler Commonly known as: Flovent HFA Inhale 2 puffs into the lungs 2 (two) times daily.   gabapentin 300 MG capsule Commonly known as: NEURONTIN Take 1 capsule (300 mg total) by mouth at bedtime. What changed: when to take this   guaifenesin 100 MG/5ML syrup Commonly known as: ROBITUSSIN Take 200 mg by mouth every 6 (six) hours as needed for cough.   lactulose 10 GM/15ML solution Commonly known as: CHRONULAC Take 30 mLs (20 g total) by mouth daily as needed for mild constipation.   Lidocaine 4 % Ptch Apply 1 patch topically daily.   loperamide 2 MG capsule Commonly known as: IMODIUM Take 2 mg by mouth 4 (four) times daily as needed for diarrhea or loose stools.   magnesium hydroxide 400 MG/5ML  suspension Commonly known as: MILK OF MAGNESIA Take 30 mLs by mouth daily as needed for mild constipation.   meloxicam 15 MG tablet Commonly known as: MOBIC Take 15 mg by mouth daily.   Mintox [id number] MG/5ML suspension Generic drug: alum & mag hydroxide-simeth Take 30 mLs by mouth 3 (three) times daily as needed for indigestion or heartburn.   neomycin-bacitracin-polymyxin ointment Commonly known as: NEOSPORIN Apply 1 application topically daily. Apply to skin tear on right calf   nitroGLYCERIN 0.4 MG SL tablet Commonly known as: NITROSTAT Place 0.4 mg under the tongue every 5 (five) minutes as needed for chest pain.   Nucynta 50 MG tablet Generic drug: tapentadol Take 50 mg by mouth 3 (three) times daily.   nystatin cream Commonly known as: MYCOSTATIN Apply 1 application topically as needed (panniculitis). Apply to pannus   omeprazole 20 MG capsule Commonly known as: PRILOSEC TAKE 1 CAPSULE BY MOUTH EVERY DAY. What changed:   how much to take  how to take  this  when to take this  additional instructions   oxybutynin 5 MG tablet Commonly known as: DITROPAN TAKE ONE TABLET BY MOUTH EVERY DAY   propranolol 60 MG tablet Commonly known as: INDERAL Take 60 mg by mouth daily.   sertraline 100 MG tablet Commonly known as: ZOLOFT TAKE ONE TABLET BY MOUTH EVERY DAY What changed:   how much to take  how to take this  when to take this  additional instructions   triamcinolone cream 0.1 % Commonly known as: KENALOG Apply 1 application topically 2 (two) times daily.   trolamine salicylate 10 % cream Commonly known as: ASPERCREME Apply 1 application topically 3 (three) times daily as needed for muscle pain.   verapamil 240 MG 24 hr capsule Commonly known as: VERELAN PM Take 240 mg by mouth at bedtime.         DISCHARGE INSTRUCTIONS:   DIET:  Cardiac diet  DISCHARGE CONDITION:  Stable  ACTIVITY:  Activity as tolerated  OXYGEN:  Home  Oxygen: No.   Oxygen Delivery: room air  DISCHARGE LOCATION:  Assisted Living with Home Health PT.    If you experience worsening of your admission symptoms, develop shortness of breath, life threatening emergency, suicidal or homicidal thoughts you must seek medical attention immediately by calling 911 or calling your MD immediately  if symptoms less severe.  You Must read complete instructions/literature along with all the possible adverse reactions/side effects for all the Medicines you take and that have been prescribed to you. Take any new Medicines after you have completely understood and accpet all the possible adverse reactions/side effects.   Please note  You were cared for by a hospitalist during your hospital stay. If you have any questions about your discharge medications or the care you received while you were in the hospital after you are discharged, you can call the unit and asked to speak with the hospitalist on call if the hospitalist that took care of you is not available. Once you are discharged, your primary care physician will handle any further medical issues. Please note that NO REFILLS for any discharge medications will be authorized once you are discharged, as it is imperative that you return to your primary care physician (or establish a relationship with a primary care physician if you do not have one) for your aftercare needs so that they can reassess your need for medications and monitor your lab values.     Today   No acute events overnight, mental status back at baseline.  Creatinine is also at baseline.  Will discharge to assisted living with home health services.  VITAL SIGNS:  Blood pressure (!) 133/58, pulse 69, temperature 98.3 F (36.8 C), temperature source Oral, resp. rate 20, height 5\' 3"  (1.6 m), weight 84.8 kg, SpO2 95 %.  I/O:    Intake/Output Summary (Last 24 hours) at 03/01/2019 1203 Last data filed at 03/01/2019 1100 Gross per 24 hour   Intake 2205.15 ml  Output 1525 ml  Net 680.15 ml    PHYSICAL EXAMINATION:   GENERAL:  85 y.o.-year-old patient lying in bed in no acute distress. EYES: Pupils equal, round, reactive to light and accommodation. No scleral icterus. Extraocular muscles intact.  HEENT: Head atraumatic, normocephalic. Oropharynx and nasopharynx clear.  NECK:  Supple, no jugular venous distention. No thyroid enlargement, no tenderness.  LUNGS: Normal breath sounds bilaterally, no wheezing, rales, rhonchi. No use of accessory muscles of respiration.  CARDIOVASCULAR: S1, S2 normal. No murmurs, rubs, or gallops.  ABDOMEN: Soft, nontender, nondistended. Bowel sounds present. No organomegaly or mass.  EXTREMITIES: No cyanosis, clubbing or edema b/l.    NEUROLOGIC: Cranial nerves II through XII are intact. No focal Motor or sensory deficits b/l. Globally weak   PSYCHIATRIC: The patient is alert and oriented x 3.  SKIN: No obvious rash, lesion, or ulcer.   DATA REVIEW:   CBC Recent Labs  Lab 02/28/19 0514  WBC 6.4  HGB 11.3*  HCT 35.4*  PLT 246    Chemistries  Recent Labs  Lab 02/27/19 1419  03/01/19 0410  NA 131*   < > 136  K 4.8   < > 4.2  CL 96*   < > 109  CO2 24   < > 23  GLUCOSE 102*   < > 83  BUN 46*   < > 29*  CREATININE 2.32*   < > 1.00  CALCIUM 8.7*   < > 8.3*  AST 31  --   --   ALT 18  --   --   ALKPHOS 63  --   --   BILITOT 0.7  --   --    < > = values in this interval not displayed.    Cardiac Enzymes No results for input(s): TROPONINI in the last 168 hours.  Microbiology Results  Results for orders placed or performed during the hospital encounter of 02/27/19  SARS Coronavirus 2 [facility] order, Performed in [facility] hospital lab) Nasopharyngeal Nasopharyngeal Swab     Status: None   Collection Time: 02/27/19  2:19 PM   Specimen: Nasopharyngeal Swab  Result Value Ref Range Status   SARS Coronavirus 2 NEGATIVE NEGATIVE Final    Comment: (NOTE) If result is  NEGATIVE SARS-CoV-2 target nucleic acids are NOT DETECTED. The SARS-CoV-2 RNA is generally detectable in upper and lower  respiratory specimens during the acute phase of infection. The lowest  concentration of SARS-CoV-2 viral copies this assay can detect is 250  copies / mL. A negative result does not preclude SARS-CoV-2 infection  and should not be used as the sole basis for treatment or other  patient management decisions.  A negative result may occur with  improper specimen collection / handling, submission of specimen other  than nasopharyngeal swab, presence of viral mutation(s) within the  areas targeted by this assay, and inadequate number of viral copies  (<250 copies / mL). A negative result must be combined with clinical  observations, patient history, and epidemiological information. If result is POSITIVE SARS-CoV-2 target nucleic acids are DETECTED. The SARS-CoV-2 RNA is generally detectable in upper and lower  respiratory specimens dur ing the acute phase of infection.  Positive  results are indicative of active infection with SARS-CoV-2.  Clinical  correlation with patient history and other diagnostic information is  necessary to determine patient infection status.  Positive results do  not rule out bacterial infection or co-infection with other viruses. If result is PRESUMPTIVE POSTIVE SARS-CoV-2 nucleic acids MAY BE PRESENT.   A presumptive positive result was obtained on the submitted specimen  and confirmed on repeat testing.  While 2019 novel coronavirus  (SARS-CoV-2) nucleic acids may be present in the submitted sample  additional confirmatory testing may be necessary for epidemiological  and / or clinical management purposes  to differentiate between  SARS-CoV-2 and other Sarbecovirus currently known to infect humans.  If clinically indicated additional testing with an alternate test  methodology [phone]) is advised. The SARS-CoV-2 RNA is generally  detectable  in  upper and lower respiratory sp ecimens during the acute  phase of infection. The expected result is Negative. Fact Sheet for Patients:  StrictlyIdeas.no Fact Sheet for Healthcare Providers: BankingDealers.co.za This test is not yet approved or cleared by the [place] FDA and has been authorized for detection and/or diagnosis of SARS-CoV-2 by FDA under an Emergency Use Authorization (EUA).  This EUA will remain in effect (meaning this test can be used) for the duration of the COVID-19 declaration under Section 564(b)(1) of the Act, 21 U.S.C. section 360bbb-3(b)(1), unless the authorization is terminated or revoked sooner. Performed at [facility], [address] [place], [place] [address]   MRSA PCR Screening     Status: None   Collection Time: 02/27/19  9:49 PM   Specimen: Nasopharyngeal  Result Value Ref Range Status   MRSA by PCR NEGATIVE NEGATIVE Final    Comment:        The GeneXpert MRSA Assay (FDA approved for NASAL specimens only), is one component of a comprehensive MRSA colonization surveillance program. It is not intended to diagnose MRSA infection nor to guide or monitor treatment for MRSA infections. Performed at [facility], [address] [place], [place] [address]     RADIOLOGY:  Mr Herby Abraham Contrast  Result Date: 02/27/2019 CLINICAL DATA:  85 year old female with symptoms of slurred speech and weakness. 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 EXAM: MRI HEAD WITHOUT CONTRAST TECHNIQUE: Multiplanar, multiecho pulse sequences of the brain and surrounding structures were obtained without intravenous contrast. COMPARISON:  Brain and cervical spine MRI 02/22/2019 and earlier. FINDINGS: Brain: Regressed punctate diffusion abnormality in the left cerebellum, with a corresponding small T2 hyperintense area there in the left cerebellar  hemisphere. No new restricted diffusion or evidence of acute infarction. No acute intracranial hemorrhage or mass effect. Calcified left anterior frontal convexity meningioma measuring about 14 millimeters diameter redemonstrated with no significant mass effect, and no associated cerebral edema. Otherwise normal for age gray and white matter signal. No cortical encephalomalacia or chronic cerebral blood products. No restricted diffusion to suggest acute infarction. No midline shift, mass effect, ventriculomegaly, extra-axial collection or acute intracranial hemorrhage. Pituitary within normal limits. Vascular: Major intracranial vascular flow voids are stable. Skull and upper cervical spine: Bulky degenerative ligamentous hypertrophy about the odontoid resulting in mild cervicomedullary junction spinal stenosis. Otherwise negative visible cervical spine. Visualized bone marrow signal is within normal limits. Sinuses/Orbits: Stable and negative. Other: Mastoids remain clear. Grossly normal visible internal auditory structures. Negative scalp and face soft tissues. IMPRESSION: 1. No acute intracranial abnormality. 2. Fading signal abnormality associated with the recent small left cerebellar infarct. No intracranial hemorrhage or mass effect. 3. Stable small left anterior convexity meningioma without cerebral edema or significant mass effect. 4. Redemonstrated bulky degenerative ligamentous hypertrophy about the odontoid. Electronically Signed   By: Genevie Ann M.D.   On: 02/27/2019 16:08   Dg Chest Portable 1 View  Result Date: 02/27/2019 CLINICAL DATA:  Slurred speech. Slumped in chair. Patient has since returned to baseline. EXAM: PORTABLE CHEST 1 VIEW COMPARISON:  CT chest 11/03/2017.  Two-view chest x-ray 11/26/2015 FINDINGS: Heart is enlarged. This is exaggerated by low lung volumes. There is chronic elevation of the right hemidiaphragm. No edema or effusion is present. No focal airspace disease is present.  Advanced degenerative changes are noted in the shoulders bilaterally. IMPRESSION: 1. Low lung volumes. 2. Borderline cardiomegaly without failure. 3. Chronic elevation of the right  hemidiaphragm. 4. Degenerative changes are again noted at the shoulders bilaterally. Electronically Signed   By: San Morelle M.D.   On: 02/27/2019 16:27      Management plans discussed with the patient, family and they are in agreement.  CODE STATUS:     Code Status Orders  (From admission, onward)         Start     Ordered   02/27/19 1711  Do not attempt resuscitation (DNR)  Continuous    Question Answer Comment  In the event of cardiac or respiratory ARREST Do not call a "code blue"   In the event of cardiac or respiratory ARREST Do not perform Intubation, CPR, defibrillation or ACLS   In the event of cardiac or respiratory ARREST Use medication by any route, position, wound care, and other measures to relive pain and suffering. May use oxygen, suction and manual treatment of airway obstruction as needed for comfort.   Comments NMP      02/27/19 1710          TOTAL TIME TAKING CARE OF THIS PATIENT: 40 minutes.    Henreitta Leber M.D on 03/01/2019 at 12:03 PM  Between 7am to 6pm - Pager - [phone]  After 6pm go to [email or web address] - [facility]  Office  [phone]  CC: Primary care physician; [facility]

## 2019-03-01 NOTE — NC FL2 (Signed)
[place] LEVEL OF CARE SCREENING TOOL     IDENTIFICATION  Patient Name: Cindy Graham Birthdate: 05/30/1921 Sex: female Admission Date (Current Location): 02/27/2019  [facility] and [place] Number:  Engineering geologist and Address:         Provider Number: [id number]  Attending Physician Name and Address:  Henreitta Leber, MD  Relative Name and Phone Number:       Current Level of Care: Hospital Recommended Level of Care: [place] Prior Approval Number:    Date Approved/Denied:   PASRR Number:    Discharge Plan: (ALF)    Current Diagnoses: Patient Active Problem List   Diagnosis Date Noted  . Acute kidney injury (AKI) with acute tubular necrosis (ATN) ([place]) 02/27/2019  . Stroke (cerebrum) ([place]) 02/22/2019  . Cellulitis 06/12/2018  . Hyponatremia 11/24/2017  . Headache 11/24/2017  . Wound of left leg 11/01/2017  . Constipation 09/05/2017  . Other fatigue 12/22/2016  . Tremor 03/29/2016  . Amaurosis fugax 12/25/2015  . Asthma 09/17/2015  . PVD (peripheral vascular disease) ([place]) 05/28/2015  . B12 deficiency 08/29/2014  . Chronic back pain greater than 3 months duration 04/23/2013  . Chronic kidney disease (CKD), stage III (moderate) 04/12/2012  . Hyperlipidemia LDL goal < 100 03/05/2012  . UTI (urinary tract infection) 07/26/2011  . Osteoarthritis 07/06/2011  . Hypertension 02/21/2011  . Depression/Anxiety 02/21/2011    Orientation RESPIRATION BLADDER Height & Weight     Self, Time, Situation, Place  Normal Incontinent, External catheter Weight: 84.8 kg Height:  5\' 3"  (160 cm)  BEHAVIORAL SYMPTOMS/MOOD NEUROLOGICAL BOWEL NUTRITION STATUS      Continent Diet(SOFT)  AMBULATORY STATUS COMMUNICATION OF NEEDS Skin   Limited Assist   Bruising                       Personal Care Assistance Level of Assistance              Functional Limitations Info             SPECIAL CARE FACTORS FREQUENCY  PT (By licensed  PT)                    Contractures Contractures Info: Not present    Additional Factors Info  Code Status, Allergies Code Status Info: DNR Allergies Info: Statins, Tramadol, Levaquin Levofloxacin In D5w, Neomycin-polymyxin-gramicidin, Penicillins, Sulfa Antibiotics, Tetracyclines & Related           Medication List    STOP taking these medications   cephALEXin 250 MG capsule Commonly known as: KEFLEX   triamterene-hydrochlorothiazide 37.5-25 MG capsule Commonly known as: DYAZIDE     TAKE these medications   acetaminophen 500 MG tablet Commonly known as: TYLENOL Take 500 mg by mouth every 6 (six) hours as needed for mild pain or moderate pain.   acetaminophen 325 MG tablet Commonly known as: TYLENOL Take 650 mg by mouth 2 (two) times daily.   alendronate 70 MG tablet Commonly known as: FOSAMAX Take 70 mg by mouth every Thursday.   aspirin 81 MG chewable tablet Chew 81 mg by mouth daily.   Biotin 1000 MCG tablet Take 1,000 mcg by mouth daily.   ciprofloxacin 250 MG tablet Commonly known as: CIPRO Take 1 tablet (250 mg total) by mouth 2 (two) times daily for 3 days.   Daily Vites tablet Take 1 tablet by mouth daily.   enalapril 20 MG tablet Commonly known as: VASOTEC TAKE ONE  TABLET BY MOUTH EVERY DAY   Flax Seed Oil 1000 MG Caps Take 1,000 mg by mouth daily.   fluticasone 220 MCG/ACT inhaler Commonly known as: Flovent HFA Inhale 2 puffs into the lungs 2 (two) times daily.   gabapentin 300 MG capsule Commonly known as: NEURONTIN Take 1 capsule (300 mg total) by mouth at bedtime. What changed: when to take this   guaifenesin 100 MG/5ML syrup Commonly known as: ROBITUSSIN Take 200 mg by mouth every 6 (six) hours as needed for cough.   lactulose 10 GM/15ML solution Commonly known as: CHRONULAC Take 30 mLs (20 g total) by mouth daily as needed for mild constipation.   Lidocaine 4 % Ptch Apply 1 patch topically daily.    loperamide 2 MG capsule Commonly known as: IMODIUM Take 2 mg by mouth 4 (four) times daily as needed for diarrhea or loose stools.   magnesium hydroxide 400 MG/5ML suspension Commonly known as: MILK OF MAGNESIA Take 30 mLs by mouth daily as needed for mild constipation.   meloxicam 15 MG tablet Commonly known as: MOBIC Take 15 mg by mouth daily.   Mintox I037812 MG/5ML suspension Generic drug: alum & mag hydroxide-simeth Take 30 mLs by mouth 3 (three) times daily as needed for indigestion or heartburn.   neomycin-bacitracin-polymyxin ointment Commonly known as: NEOSPORIN Apply 1 application topically daily. Apply to skin tear on right calf   nitroGLYCERIN 0.4 MG SL tablet Commonly known as: NITROSTAT Place 0.4 mg under the tongue every 5 (five) minutes as needed for chest pain.   Nucynta 50 MG tablet Generic drug: tapentadol Take 50 mg by mouth 3 (three) times daily.   nystatin cream Commonly known as: MYCOSTATIN Apply 1 application topically as needed (panniculitis). Apply to pannus   omeprazole 20 MG capsule Commonly known as: PRILOSEC TAKE 1 CAPSULE BY MOUTH EVERY DAY. What changed:   how much to take  how to take this  when to take this  additional instructions   oxybutynin 5 MG tablet Commonly known as: DITROPAN TAKE ONE TABLET BY MOUTH EVERY DAY   propranolol 60 MG tablet Commonly known as: INDERAL Take 60 mg by mouth daily.   sertraline 100 MG tablet Commonly known as: ZOLOFT TAKE ONE TABLET BY MOUTH EVERY DAY What changed:   how much to take  how to take this  when to take this  additional instructions   triamcinolone cream 0.1 % Commonly known as: KENALOG Apply 1 application topically 2 (two) times daily.   trolamine salicylate 10 % cream Commonly known as: ASPERCREME Apply 1 application topically 3 (three) times daily as needed for muscle pain.   verapamil 240 MG 24 hr capsule Commonly known as: VERELAN PM Take  240 mg by mouth at bedtime.      Discharge Medications: Please see discharge summary for a list of discharge medications.  Relevant Imaging Results:  Relevant Lab Results:   Additional Information SSN: [id number]  Beverly Sessions, RN

## 2019-03-01 NOTE — Progress Notes (Signed)
Fionnuala P Braud A and O x4. VSS. Pt tolerating diet well. No complaints of nausea or vomiting. IV removed intact, prescriptions sent with patient. Discharge instructions sent with patient to facility via son, Linna Hoff, driving her there. Patient discharged via wheelchair with RN  Allergies as of 03/01/2019      Reactions   Statins Other (See Comments)   Very bad muscle aches   Tramadol Itching   Levaquin [levofloxacin In D5w] Diarrhea, Other (See Comments)   Confusion, hallucinations   Neomycin-polymyxin-gramicidin Other (See Comments)   Penicillins    Did it involve swelling of the face/tongue/throat, SOB, or low BP? Unknown Did it involve sudden or severe rash/hives, skin peeling, or any reaction on the inside of your mouth or nose? Unknown Did you need to seek medical attention at a hospital or doctor's office? Unknown When did it last happen?Unknown If all above answers are "NO", may proceed with cephalosporin use.   Sulfa Antibiotics    Tetracyclines & Related       Medication List    STOP taking these medications   cephALEXin 250 MG capsule Commonly known as: KEFLEX   triamterene-hydrochlorothiazide 37.5-25 MG capsule Commonly known as: DYAZIDE     TAKE these medications   acetaminophen 500 MG tablet Commonly known as: TYLENOL Take 500 mg by mouth every 6 (six) hours as needed for mild pain or moderate pain.   acetaminophen 325 MG tablet Commonly known as: TYLENOL Take 650 mg by mouth 2 (two) times daily.   alendronate 70 MG tablet Commonly known as: FOSAMAX Take 70 mg by mouth every Thursday.   aspirin 81 MG chewable tablet Chew 81 mg by mouth daily.   Biotin 1000 MCG tablet Take 1,000 mcg by mouth daily.   ciprofloxacin 250 MG tablet Commonly known as: CIPRO Take 1 tablet (250 mg total) by mouth 2 (two) times daily for 3 days.   Daily Vites tablet Take 1 tablet by mouth daily.   enalapril 20 MG tablet Commonly known as: VASOTEC TAKE ONE TABLET BY MOUTH  EVERY DAY   Flax Seed Oil 1000 MG Caps Take 1,000 mg by mouth daily.   fluticasone 220 MCG/ACT inhaler Commonly known as: Flovent HFA Inhale 2 puffs into the lungs 2 (two) times daily.   gabapentin 300 MG capsule Commonly known as: NEURONTIN Take 1 capsule (300 mg total) by mouth at bedtime. What changed: when to take this   guaifenesin 100 MG/5ML syrup Commonly known as: ROBITUSSIN Take 200 mg by mouth every 6 (six) hours as needed for cough.   lactulose 10 GM/15ML solution Commonly known as: CHRONULAC Take 30 mLs (20 g total) by mouth daily as needed for mild constipation.   Lidocaine 4 % Ptch Apply 1 patch topically daily.   loperamide 2 MG capsule Commonly known as: IMODIUM Take 2 mg by mouth 4 (four) times daily as needed for diarrhea or loose stools.   magnesium hydroxide 400 MG/5ML suspension Commonly known as: MILK OF MAGNESIA Take 30 mLs by mouth daily as needed for mild constipation.   meloxicam 15 MG tablet Commonly known as: MOBIC Take 15 mg by mouth daily.   Mintox I037812 MG/5ML suspension Generic drug: alum & mag hydroxide-simeth Take 30 mLs by mouth 3 (three) times daily as needed for indigestion or heartburn.   neomycin-bacitracin-polymyxin ointment Commonly known as: NEOSPORIN Apply 1 application topically daily. Apply to skin tear on right calf   nitroGLYCERIN 0.4 MG SL tablet Commonly known as: NITROSTAT Place 0.4 mg  under the tongue every 5 (five) minutes as needed for chest pain.   Nucynta 50 MG tablet Generic drug: tapentadol Take 50 mg by mouth 3 (three) times daily.   nystatin cream Commonly known as: MYCOSTATIN Apply 1 application topically as needed (panniculitis). Apply to pannus   omeprazole 20 MG capsule Commonly known as: PRILOSEC TAKE 1 CAPSULE BY MOUTH EVERY DAY. What changed:   how much to take  how to take this  when to take this  additional instructions   oxybutynin 5 MG tablet Commonly known as:  DITROPAN TAKE ONE TABLET BY MOUTH EVERY DAY   propranolol 60 MG tablet Commonly known as: INDERAL Take 60 mg by mouth daily.   sertraline 100 MG tablet Commonly known as: ZOLOFT TAKE ONE TABLET BY MOUTH EVERY DAY What changed:   how much to take  how to take this  when to take this  additional instructions   triamcinolone cream 0.1 % Commonly known as: KENALOG Apply 1 application topically 2 (two) times daily.   trolamine salicylate 10 % cream Commonly known as: ASPERCREME Apply 1 application topically 3 (three) times daily as needed for muscle pain.   verapamil 240 MG 24 hr capsule Commonly known as: VERELAN PM Take 240 mg by mouth at bedtime.       Darnelle Catalan

## 2019-03-01 NOTE — Care Management Important Message (Signed)
Important Message  Patient Details  Name: Cindy Graham MRN: [id number] Date of Birth: 07-18-1921   Medicare Important Message Given:  Yes     Dannette Barbara 03/01/2019, 12:31 PM

## 2019-03-01 NOTE — Progress Notes (Signed)
I spoke with Malachy Mood, (daughter in law) and she contacted Dan to pick up his mother (the patient). I answered Cheryl's questions.

## 2019-03-06 DIAGNOSIS — M542 Cervicalgia: Secondary | ICD-10-CM | POA: Diagnosis not present

## 2019-03-06 DIAGNOSIS — I10 Essential (primary) hypertension: Secondary | ICD-10-CM | POA: Diagnosis not present

## 2019-03-06 DIAGNOSIS — R2681 Unsteadiness on feet: Secondary | ICD-10-CM | POA: Diagnosis not present

## 2019-03-06 DIAGNOSIS — R410 Disorientation, unspecified: Secondary | ICD-10-CM | POA: Diagnosis not present

## 2019-03-06 DIAGNOSIS — M6281 Muscle weakness (generalized): Secondary | ICD-10-CM | POA: Diagnosis not present

## 2019-03-06 DIAGNOSIS — M159 Polyosteoarthritis, unspecified: Secondary | ICD-10-CM | POA: Diagnosis not present

## 2019-03-06 DIAGNOSIS — N3000 Acute cystitis without hematuria: Secondary | ICD-10-CM | POA: Diagnosis not present

## 2019-03-06 DIAGNOSIS — I6320 Cerebral infarction due to unspecified occlusion or stenosis of unspecified precerebral arteries: Secondary | ICD-10-CM | POA: Diagnosis not present

## 2019-03-07 DIAGNOSIS — R278 Other lack of coordination: Secondary | ICD-10-CM | POA: Diagnosis not present

## 2019-03-07 DIAGNOSIS — I63542 Cerebral infarction due to unspecified occlusion or stenosis of left cerebellar artery: Secondary | ICD-10-CM | POA: Diagnosis not present

## 2019-03-07 DIAGNOSIS — M47812 Spondylosis without myelopathy or radiculopathy, cervical region: Secondary | ICD-10-CM | POA: Diagnosis not present

## 2019-03-07 DIAGNOSIS — Z741 Need for assistance with personal care: Secondary | ICD-10-CM | POA: Diagnosis not present

## 2019-03-07 DIAGNOSIS — M6281 Muscle weakness (generalized): Secondary | ICD-10-CM | POA: Diagnosis not present

## 2019-03-07 DIAGNOSIS — M1990 Unspecified osteoarthritis, unspecified site: Secondary | ICD-10-CM | POA: Diagnosis not present

## 2019-03-08 DIAGNOSIS — R278 Other lack of coordination: Secondary | ICD-10-CM | POA: Diagnosis not present

## 2019-03-08 DIAGNOSIS — Z741 Need for assistance with personal care: Secondary | ICD-10-CM | POA: Diagnosis not present

## 2019-03-08 DIAGNOSIS — I63542 Cerebral infarction due to unspecified occlusion or stenosis of left cerebellar artery: Secondary | ICD-10-CM | POA: Diagnosis not present

## 2019-03-08 DIAGNOSIS — M1990 Unspecified osteoarthritis, unspecified site: Secondary | ICD-10-CM | POA: Diagnosis not present

## 2019-03-08 DIAGNOSIS — M6281 Muscle weakness (generalized): Secondary | ICD-10-CM | POA: Diagnosis not present

## 2019-03-08 DIAGNOSIS — M47812 Spondylosis without myelopathy or radiculopathy, cervical region: Secondary | ICD-10-CM | POA: Diagnosis not present

## 2019-03-08 DIAGNOSIS — R2681 Unsteadiness on feet: Secondary | ICD-10-CM | POA: Diagnosis not present

## 2019-03-11 DIAGNOSIS — M1990 Unspecified osteoarthritis, unspecified site: Secondary | ICD-10-CM | POA: Diagnosis not present

## 2019-03-11 DIAGNOSIS — M47812 Spondylosis without myelopathy or radiculopathy, cervical region: Secondary | ICD-10-CM | POA: Diagnosis not present

## 2019-03-11 DIAGNOSIS — R2681 Unsteadiness on feet: Secondary | ICD-10-CM | POA: Diagnosis not present

## 2019-03-11 DIAGNOSIS — I63542 Cerebral infarction due to unspecified occlusion or stenosis of left cerebellar artery: Secondary | ICD-10-CM | POA: Diagnosis not present

## 2019-03-11 DIAGNOSIS — Z741 Need for assistance with personal care: Secondary | ICD-10-CM | POA: Diagnosis not present

## 2019-03-11 DIAGNOSIS — M6281 Muscle weakness (generalized): Secondary | ICD-10-CM | POA: Diagnosis not present

## 2019-03-11 DIAGNOSIS — R278 Other lack of coordination: Secondary | ICD-10-CM | POA: Diagnosis not present

## 2019-03-13 DIAGNOSIS — R2681 Unsteadiness on feet: Secondary | ICD-10-CM | POA: Diagnosis not present

## 2019-03-13 DIAGNOSIS — I6320 Cerebral infarction due to unspecified occlusion or stenosis of unspecified precerebral arteries: Secondary | ICD-10-CM | POA: Diagnosis not present

## 2019-03-13 DIAGNOSIS — J449 Chronic obstructive pulmonary disease, unspecified: Secondary | ICD-10-CM | POA: Diagnosis not present

## 2019-03-13 DIAGNOSIS — M47812 Spondylosis without myelopathy or radiculopathy, cervical region: Secondary | ICD-10-CM | POA: Diagnosis not present

## 2019-03-13 DIAGNOSIS — R32 Unspecified urinary incontinence: Secondary | ICD-10-CM | POA: Diagnosis not present

## 2019-03-13 DIAGNOSIS — G894 Chronic pain syndrome: Secondary | ICD-10-CM | POA: Diagnosis not present

## 2019-03-13 DIAGNOSIS — M6281 Muscle weakness (generalized): Secondary | ICD-10-CM | POA: Diagnosis not present

## 2019-03-13 DIAGNOSIS — F339 Major depressive disorder, recurrent, unspecified: Secondary | ICD-10-CM | POA: Diagnosis not present

## 2019-03-13 DIAGNOSIS — M069 Rheumatoid arthritis, unspecified: Secondary | ICD-10-CM | POA: Diagnosis not present

## 2019-03-13 DIAGNOSIS — M1990 Unspecified osteoarthritis, unspecified site: Secondary | ICD-10-CM | POA: Diagnosis not present

## 2019-03-13 DIAGNOSIS — I63542 Cerebral infarction due to unspecified occlusion or stenosis of left cerebellar artery: Secondary | ICD-10-CM | POA: Diagnosis not present

## 2019-03-13 DIAGNOSIS — R278 Other lack of coordination: Secondary | ICD-10-CM | POA: Diagnosis not present

## 2019-03-13 DIAGNOSIS — G309 Alzheimer's disease, unspecified: Secondary | ICD-10-CM | POA: Diagnosis not present

## 2019-03-13 DIAGNOSIS — R269 Unspecified abnormalities of gait and mobility: Secondary | ICD-10-CM | POA: Diagnosis not present

## 2019-03-13 DIAGNOSIS — S98131A Complete traumatic amputation of one right lesser toe, initial encounter: Secondary | ICD-10-CM | POA: Diagnosis not present

## 2019-03-13 DIAGNOSIS — I10 Essential (primary) hypertension: Secondary | ICD-10-CM | POA: Diagnosis not present

## 2019-03-13 DIAGNOSIS — K219 Gastro-esophageal reflux disease without esophagitis: Secondary | ICD-10-CM | POA: Diagnosis not present

## 2019-03-13 DIAGNOSIS — Z741 Need for assistance with personal care: Secondary | ICD-10-CM | POA: Diagnosis not present

## 2019-03-15 DIAGNOSIS — R278 Other lack of coordination: Secondary | ICD-10-CM | POA: Diagnosis not present

## 2019-03-15 DIAGNOSIS — Z741 Need for assistance with personal care: Secondary | ICD-10-CM | POA: Diagnosis not present

## 2019-03-15 DIAGNOSIS — R2681 Unsteadiness on feet: Secondary | ICD-10-CM | POA: Diagnosis not present

## 2019-03-15 DIAGNOSIS — I63542 Cerebral infarction due to unspecified occlusion or stenosis of left cerebellar artery: Secondary | ICD-10-CM | POA: Diagnosis not present

## 2019-03-15 DIAGNOSIS — M47812 Spondylosis without myelopathy or radiculopathy, cervical region: Secondary | ICD-10-CM | POA: Diagnosis not present

## 2019-03-15 DIAGNOSIS — M6281 Muscle weakness (generalized): Secondary | ICD-10-CM | POA: Diagnosis not present

## 2019-03-15 DIAGNOSIS — M1990 Unspecified osteoarthritis, unspecified site: Secondary | ICD-10-CM | POA: Diagnosis not present

## 2019-03-18 DIAGNOSIS — R278 Other lack of coordination: Secondary | ICD-10-CM | POA: Diagnosis not present

## 2019-03-18 DIAGNOSIS — M47812 Spondylosis without myelopathy or radiculopathy, cervical region: Secondary | ICD-10-CM | POA: Diagnosis not present

## 2019-03-18 DIAGNOSIS — M1990 Unspecified osteoarthritis, unspecified site: Secondary | ICD-10-CM | POA: Diagnosis not present

## 2019-03-18 DIAGNOSIS — Z741 Need for assistance with personal care: Secondary | ICD-10-CM | POA: Diagnosis not present

## 2019-03-18 DIAGNOSIS — I63542 Cerebral infarction due to unspecified occlusion or stenosis of left cerebellar artery: Secondary | ICD-10-CM | POA: Diagnosis not present

## 2019-03-18 DIAGNOSIS — R2681 Unsteadiness on feet: Secondary | ICD-10-CM | POA: Diagnosis not present

## 2019-03-18 DIAGNOSIS — M6281 Muscle weakness (generalized): Secondary | ICD-10-CM | POA: Diagnosis not present

## 2019-03-20 DIAGNOSIS — M47812 Spondylosis without myelopathy or radiculopathy, cervical region: Secondary | ICD-10-CM | POA: Diagnosis not present

## 2019-03-20 DIAGNOSIS — Z741 Need for assistance with personal care: Secondary | ICD-10-CM | POA: Diagnosis not present

## 2019-03-20 DIAGNOSIS — R2681 Unsteadiness on feet: Secondary | ICD-10-CM | POA: Diagnosis not present

## 2019-03-20 DIAGNOSIS — M6281 Muscle weakness (generalized): Secondary | ICD-10-CM | POA: Diagnosis not present

## 2019-03-20 DIAGNOSIS — R278 Other lack of coordination: Secondary | ICD-10-CM | POA: Diagnosis not present

## 2019-03-20 DIAGNOSIS — M1990 Unspecified osteoarthritis, unspecified site: Secondary | ICD-10-CM | POA: Diagnosis not present

## 2019-03-20 DIAGNOSIS — I63542 Cerebral infarction due to unspecified occlusion or stenosis of left cerebellar artery: Secondary | ICD-10-CM | POA: Diagnosis not present

## 2019-03-21 DIAGNOSIS — M6281 Muscle weakness (generalized): Secondary | ICD-10-CM | POA: Diagnosis not present

## 2019-03-21 DIAGNOSIS — R2681 Unsteadiness on feet: Secondary | ICD-10-CM | POA: Diagnosis not present

## 2019-03-22 DIAGNOSIS — Z741 Need for assistance with personal care: Secondary | ICD-10-CM | POA: Diagnosis not present

## 2019-03-22 DIAGNOSIS — M6281 Muscle weakness (generalized): Secondary | ICD-10-CM | POA: Diagnosis not present

## 2019-03-22 DIAGNOSIS — R278 Other lack of coordination: Secondary | ICD-10-CM | POA: Diagnosis not present

## 2019-03-22 DIAGNOSIS — M1990 Unspecified osteoarthritis, unspecified site: Secondary | ICD-10-CM | POA: Diagnosis not present

## 2019-03-22 DIAGNOSIS — M47812 Spondylosis without myelopathy or radiculopathy, cervical region: Secondary | ICD-10-CM | POA: Diagnosis not present

## 2019-03-22 DIAGNOSIS — I63542 Cerebral infarction due to unspecified occlusion or stenosis of left cerebellar artery: Secondary | ICD-10-CM | POA: Diagnosis not present

## 2019-03-25 DIAGNOSIS — M6281 Muscle weakness (generalized): Secondary | ICD-10-CM | POA: Diagnosis not present

## 2019-03-25 DIAGNOSIS — I63542 Cerebral infarction due to unspecified occlusion or stenosis of left cerebellar artery: Secondary | ICD-10-CM | POA: Diagnosis not present

## 2019-03-25 DIAGNOSIS — M1990 Unspecified osteoarthritis, unspecified site: Secondary | ICD-10-CM | POA: Diagnosis not present

## 2019-03-25 DIAGNOSIS — M47812 Spondylosis without myelopathy or radiculopathy, cervical region: Secondary | ICD-10-CM | POA: Diagnosis not present

## 2019-03-25 DIAGNOSIS — R2681 Unsteadiness on feet: Secondary | ICD-10-CM | POA: Diagnosis not present

## 2019-03-25 DIAGNOSIS — R278 Other lack of coordination: Secondary | ICD-10-CM | POA: Diagnosis not present

## 2019-03-25 DIAGNOSIS — Z741 Need for assistance with personal care: Secondary | ICD-10-CM | POA: Diagnosis not present

## 2019-03-27 DIAGNOSIS — M1990 Unspecified osteoarthritis, unspecified site: Secondary | ICD-10-CM | POA: Diagnosis not present

## 2019-03-27 DIAGNOSIS — R2681 Unsteadiness on feet: Secondary | ICD-10-CM | POA: Diagnosis not present

## 2019-03-27 DIAGNOSIS — R278 Other lack of coordination: Secondary | ICD-10-CM | POA: Diagnosis not present

## 2019-03-27 DIAGNOSIS — M6281 Muscle weakness (generalized): Secondary | ICD-10-CM | POA: Diagnosis not present

## 2019-03-27 DIAGNOSIS — I63542 Cerebral infarction due to unspecified occlusion or stenosis of left cerebellar artery: Secondary | ICD-10-CM | POA: Diagnosis not present

## 2019-03-27 DIAGNOSIS — M47812 Spondylosis without myelopathy or radiculopathy, cervical region: Secondary | ICD-10-CM | POA: Diagnosis not present

## 2019-03-27 DIAGNOSIS — Z741 Need for assistance with personal care: Secondary | ICD-10-CM | POA: Diagnosis not present

## 2019-03-29 DIAGNOSIS — R2681 Unsteadiness on feet: Secondary | ICD-10-CM | POA: Diagnosis not present

## 2019-03-29 DIAGNOSIS — R278 Other lack of coordination: Secondary | ICD-10-CM | POA: Diagnosis not present

## 2019-03-29 DIAGNOSIS — I63542 Cerebral infarction due to unspecified occlusion or stenosis of left cerebellar artery: Secondary | ICD-10-CM | POA: Diagnosis not present

## 2019-03-29 DIAGNOSIS — M6281 Muscle weakness (generalized): Secondary | ICD-10-CM | POA: Diagnosis not present

## 2019-03-29 DIAGNOSIS — M47812 Spondylosis without myelopathy or radiculopathy, cervical region: Secondary | ICD-10-CM | POA: Diagnosis not present

## 2019-03-29 DIAGNOSIS — M1990 Unspecified osteoarthritis, unspecified site: Secondary | ICD-10-CM | POA: Diagnosis not present

## 2019-03-29 DIAGNOSIS — Z741 Need for assistance with personal care: Secondary | ICD-10-CM | POA: Diagnosis not present

## 2019-04-01 DIAGNOSIS — Z741 Need for assistance with personal care: Secondary | ICD-10-CM | POA: Diagnosis not present

## 2019-04-01 DIAGNOSIS — R278 Other lack of coordination: Secondary | ICD-10-CM | POA: Diagnosis not present

## 2019-04-01 DIAGNOSIS — I10 Essential (primary) hypertension: Secondary | ICD-10-CM | POA: Diagnosis not present

## 2019-04-01 DIAGNOSIS — R269 Unspecified abnormalities of gait and mobility: Secondary | ICD-10-CM | POA: Diagnosis not present

## 2019-04-01 DIAGNOSIS — M47812 Spondylosis without myelopathy or radiculopathy, cervical region: Secondary | ICD-10-CM | POA: Diagnosis not present

## 2019-04-01 DIAGNOSIS — M159 Polyosteoarthritis, unspecified: Secondary | ICD-10-CM | POA: Diagnosis not present

## 2019-04-01 DIAGNOSIS — R2681 Unsteadiness on feet: Secondary | ICD-10-CM | POA: Diagnosis not present

## 2019-04-01 DIAGNOSIS — I679 Cerebrovascular disease, unspecified: Secondary | ICD-10-CM | POA: Diagnosis not present

## 2019-04-01 DIAGNOSIS — M1990 Unspecified osteoarthritis, unspecified site: Secondary | ICD-10-CM | POA: Diagnosis not present

## 2019-04-01 DIAGNOSIS — I872 Venous insufficiency (chronic) (peripheral): Secondary | ICD-10-CM | POA: Diagnosis not present

## 2019-04-01 DIAGNOSIS — M6281 Muscle weakness (generalized): Secondary | ICD-10-CM | POA: Diagnosis not present

## 2019-04-01 DIAGNOSIS — I63542 Cerebral infarction due to unspecified occlusion or stenosis of left cerebellar artery: Secondary | ICD-10-CM | POA: Diagnosis not present

## 2019-04-01 DIAGNOSIS — S81802A Unspecified open wound, left lower leg, initial encounter: Secondary | ICD-10-CM | POA: Diagnosis not present

## 2019-04-02 DIAGNOSIS — S81802A Unspecified open wound, left lower leg, initial encounter: Secondary | ICD-10-CM | POA: Diagnosis not present

## 2019-04-02 DIAGNOSIS — I872 Venous insufficiency (chronic) (peripheral): Secondary | ICD-10-CM | POA: Diagnosis not present

## 2019-04-02 DIAGNOSIS — M79605 Pain in left leg: Secondary | ICD-10-CM | POA: Diagnosis not present

## 2019-04-02 DIAGNOSIS — R260 Ataxic gait: Secondary | ICD-10-CM | POA: Diagnosis not present

## 2019-04-03 DIAGNOSIS — R2681 Unsteadiness on feet: Secondary | ICD-10-CM | POA: Diagnosis not present

## 2019-04-03 DIAGNOSIS — M6281 Muscle weakness (generalized): Secondary | ICD-10-CM | POA: Diagnosis not present

## 2019-04-08 DIAGNOSIS — R269 Unspecified abnormalities of gait and mobility: Secondary | ICD-10-CM | POA: Diagnosis not present

## 2019-04-08 DIAGNOSIS — I739 Peripheral vascular disease, unspecified: Secondary | ICD-10-CM | POA: Diagnosis not present

## 2019-04-08 DIAGNOSIS — S81812D Laceration without foreign body, left lower leg, subsequent encounter: Secondary | ICD-10-CM | POA: Diagnosis not present

## 2019-04-08 DIAGNOSIS — K5901 Slow transit constipation: Secondary | ICD-10-CM | POA: Diagnosis not present

## 2019-04-12 ENCOUNTER — Encounter: Payer: Self-pay | Admitting: Intensive Care

## 2019-04-12 ENCOUNTER — Emergency Department
Admission: EM | Admit: 2019-04-12 | Discharge: 2019-04-12 | Disposition: A | Discharge status: Home / Self Care | Payer: Medicare Other | Attending: Emergency Medicine | Admitting: Emergency Medicine

## 2019-04-12 ENCOUNTER — Other Ambulatory Visit: Payer: Self-pay

## 2019-04-12 DIAGNOSIS — N183 Chronic kidney disease, stage 3 unspecified: Secondary | ICD-10-CM | POA: Diagnosis not present

## 2019-04-12 DIAGNOSIS — R443 Hallucinations, unspecified: Secondary | ICD-10-CM | POA: Diagnosis not present

## 2019-04-12 DIAGNOSIS — Z79899 Other long term (current) drug therapy: Secondary | ICD-10-CM | POA: Insufficient documentation

## 2019-04-12 DIAGNOSIS — I10 Essential (primary) hypertension: Secondary | ICD-10-CM | POA: Diagnosis not present

## 2019-04-12 DIAGNOSIS — R442 Other hallucinations: Secondary | ICD-10-CM | POA: Insufficient documentation

## 2019-04-12 DIAGNOSIS — I129 Hypertensive chronic kidney disease with stage 1 through stage 4 chronic kidney disease, or unspecified chronic kidney disease: Secondary | ICD-10-CM | POA: Insufficient documentation

## 2019-04-12 DIAGNOSIS — Z7982 Long term (current) use of aspirin: Secondary | ICD-10-CM | POA: Insufficient documentation

## 2019-04-12 DIAGNOSIS — T50905A Adverse effect of unspecified drugs, medicaments and biological substances, initial encounter: Secondary | ICD-10-CM | POA: Diagnosis not present

## 2019-04-12 DIAGNOSIS — T3695XA Adverse effect of unspecified systemic antibiotic, initial encounter: Secondary | ICD-10-CM | POA: Diagnosis not present

## 2019-04-12 DIAGNOSIS — J449 Chronic obstructive pulmonary disease, unspecified: Secondary | ICD-10-CM | POA: Diagnosis not present

## 2019-04-12 HISTORY — DX: Anxiety disorder, unspecified: F41.9

## 2019-04-12 HISTORY — DX: Unspecified asthma, uncomplicated: J45.909

## 2019-04-12 LAB — URINALYSIS, COMPLETE (UACMP) WITH MICROSCOPIC
Bacteria, UA: NONE SEEN
Bilirubin Urine: NEGATIVE
Glucose, UA: NEGATIVE mg/dL
Hgb urine dipstick: NEGATIVE
Ketones, ur: 5 mg/dL — AB
Nitrite: NEGATIVE
Protein, ur: NEGATIVE mg/dL
Specific Gravity, Urine: 1.013 (ref 1.005–1.030)
pH: 5 (ref 5.0–8.0)

## 2019-04-12 LAB — CBC
HCT: 39.7 % (ref 36.0–46.0)
Hemoglobin: 13.2 g/dL (ref 12.0–15.0)
MCH: 31.1 pg (ref 26.0–34.0)
MCHC: 33.2 g/dL (ref 30.0–36.0)
MCV: 93.4 fL (ref 80.0–100.0)
Platelets: 260 10*3/uL (ref 150–400)
RBC: 4.25 MIL/uL (ref 3.87–5.11)
RDW: 13.7 % (ref 11.5–15.5)
WBC: 7.3 10*3/uL (ref 4.0–10.5)
nRBC: 0 % (ref 0.0–0.2)

## 2019-04-12 LAB — BASIC METABOLIC PANEL
Anion gap: 11 (ref 5–15)
BUN: 28 mg/dL — ABNORMAL HIGH (ref 8–23)
CO2: 27 mmol/L (ref 22–32)
Calcium: 9.1 mg/dL (ref 8.9–10.3)
Chloride: 100 mmol/L (ref 98–111)
Creatinine, Ser: 0.83 mg/dL (ref 0.44–1.00)
GFR calc Af Amer: >60 mL/min (ref >60)
GFR calc non Af Amer: 60 mL/min — ABNORMAL LOW (ref >60)
Glucose, Bld: 106 mg/dL — ABNORMAL HIGH (ref 70–99)
Potassium: 4.1 mmol/L (ref 3.5–5.1)
Sodium: 138 mmol/L (ref 135–145)

## 2019-04-12 NOTE — ED Triage Notes (Signed)
Son brought patient in from [facility] assisted living for hallucinations for a couple of days. Patient was placed on antibiotic X1 week ago for UTI and left lower leg wound. Staff thinks patient is hallucinating due to antibiotic. Denies pain. Son reports patient had a stroke about a month ago and left with no deficits. Patient A&O x4 today in triage. Son reports behaviors of urinating on self more, dropping and knocking items over, slurred speech, and intermittent confusion.

## 2019-04-12 NOTE — ED Notes (Signed)
Report given to [facility], made aware that pt son would be transporting her back to facility and that the MD wants to discontinue Doxycycline.

## 2019-04-12 NOTE — Discharge Instructions (Addendum)
Please discontinue your doxycycline antibiotic.    A urine culture has been sent but no obvious urinary tract infection remains.  To the emergency department for any worsening hallucinations, or any other symptom personally concerning to yourself or staff members.

## 2019-04-12 NOTE — ED Provider Notes (Signed)
[facility] Emergency Department Provider Note  Time seen: 2:23 PM  I have reviewed the triage vital signs and the nursing notes.   HISTORY  Chief Complaint Hallucinations  HPI Cindy Graham is a 85 y.o. female with a past medical history of anxiety, asthma, hypertension, hyperlipidemia presents to the emergency department for hallucinations.  According to the son patient lives at a nursing facility, over the past 2 days she has been experiencing hallucinations he states 1 day she thought worms were coming out of the sink faucet and yesterday she thought there was to women in her room at nighttime.  Son states this is happened previously when she has been on antibiotics for urinary tract infection.  Was placed on antibiotics several days ago for urinary tract infection.  He is not sure if that has anything to do with it or not.  Patient states she has not been sleeping well recently but states she does nap throughout the day.  Patient is awake and alert, overall well-appearing.  The son states today she has been acting normal and at her baseline.  Son believes that she has started to clear up as far as the hallucinations but wanted to bring her to be safe.   Past Medical History:  Diagnosis Date  . Anxiety   . Arthritis    osteo  . Asthma   . COPD (chronic obstructive pulmonary disease) ([place])   . GERD (gastroesophageal reflux disease)   . Heart murmur   . Hernia   . Hyperlipidemia   . Hypertension   . Melanoma ([place])    face  . Syncope and collapse   . Urine incontinence     Patient Active Problem List   Diagnosis Date Noted  . Acute kidney injury (AKI) with acute tubular necrosis (ATN) ([place]) 02/27/2019  . Stroke (cerebrum) ([place]) 02/22/2019  . Cellulitis 06/12/2018  . Hyponatremia 11/24/2017  . Headache 11/24/2017  . Wound of left leg 11/01/2017  . Constipation 09/05/2017  . Other fatigue 12/22/2016  . Tremor 03/29/2016  . Amaurosis fugax 12/25/2015   . Asthma 09/17/2015  . PVD (peripheral vascular disease) ([place]) 05/28/2015  . B12 deficiency 08/29/2014  . Chronic back pain greater than 3 months duration 04/23/2013  . Chronic kidney disease (CKD), stage III (moderate) 04/12/2012  . Hyperlipidemia LDL goal < 100 03/05/2012  . UTI (urinary tract infection) 07/26/2011  . Osteoarthritis 07/06/2011  . Hypertension 02/21/2011  . Depression/Anxiety 02/21/2011    Past Surgical History:  Procedure Laterality Date  . ABDOMINAL HYSTERECTOMY    . AMPUTATION TOE Right 06/14/2018   Procedure: AMPUTATION TOE - RIGHT 5TH;  Surgeon: Albertine Patricia, DPM;  Location: [facility] ORS;  Service: Podiatry;  Laterality: Right;  . CHOLECYSTECTOMY    . HERNIA REPAIR      Prior to Admission medications   Medication Sig Start Date End Date Taking? Authorizing Provider  acetaminophen (TYLENOL) 325 MG tablet Take 650 mg by mouth 2 (two) times daily.    [provider]  acetaminophen (TYLENOL) 500 MG tablet Take 500 mg by mouth every 6 (six) hours as needed for mild pain or moderate pain.     [provider]  alendronate (FOSAMAX) 70 MG tablet Take 70 mg by mouth every Thursday.     [provider]  alum & mag hydroxide-simeth (MINTOX) 200-200-20 MG/5ML suspension Take 30 mLs by mouth 3 (three) times daily as needed for indigestion or heartburn.     [provider]  aspirin 81 MG chewable tablet Chew 81 mg by mouth daily.    [provider]  Biotin 1000 MCG tablet Take 1,000 mcg by mouth daily.      [provider]  enalapril (VASOTEC) 20 MG tablet TAKE ONE TABLET BY MOUTH EVERY DAY Patient taking differently: Take 20 mg by mouth daily.  09/15/17   Leone Haven, MD  Flaxseed, Linseed, (FLAX SEED OIL) 1000 MG CAPS Take 1,000 mg by mouth daily.     [provider]  fluticasone (FLOVENT HFA) 220 MCG/ACT inhaler Inhale 2 puffs into the lungs 2 (two) times daily. 08/12/17   Leone Haven, MD   gabapentin (NEURONTIN) 300 MG capsule Take 1 capsule (300 mg total) by mouth at bedtime. Patient taking differently: Take 300 mg by mouth daily.  12/06/17   Leone Haven, MD  guaifenesin (ROBITUSSIN) 100 MG/5ML syrup Take 200 mg by mouth every 6 (six) hours as needed for cough.     [provider]  lactulose (CHRONULAC) 10 GM/15ML solution Take 30 mLs (20 g total) by mouth daily as needed for mild constipation. 12/06/17   Leone Haven, MD  Lidocaine 4 % PTCH Apply 1 patch topically daily.    [provider]  loperamide (IMODIUM) 2 MG capsule Take 2 mg by mouth 4 (four) times daily as needed for diarrhea or loose stools.     [provider]  magnesium hydroxide (MILK OF MAGNESIA) 400 MG/5ML suspension Take 30 mLs by mouth daily as needed for mild constipation.    [provider]  meloxicam (MOBIC) 15 MG tablet Take 15 mg by mouth daily.    [provider]  Multiple Vitamin (DAILY VITES) tablet Take 1 tablet by mouth daily.    [provider]  neomycin-bacitracin-polymyxin (NEOSPORIN) ointment Apply 1 application topically daily. Apply to skin tear on right calf    [provider]  nitroGLYCERIN (NITROSTAT) 0.4 MG SL tablet Place 0.4 mg under the tongue every 5 (five) minutes as needed for chest pain.    [provider]  nystatin cream (MYCOSTATIN) Apply 1 application topically as needed (panniculitis). Apply to pannus    [provider]  omeprazole (PRILOSEC) 20 MG capsule TAKE 1 CAPSULE BY MOUTH EVERY DAY. Patient taking differently: Take 20 mg by mouth daily.  11/15/17   Leone Haven, MD  oxybutynin (DITROPAN) 5 MG tablet TAKE ONE TABLET BY MOUTH EVERY DAY Patient taking differently: Take 5 mg by mouth daily.  09/15/17   Leone Haven, MD  propranolol (INDERAL) 60 MG tablet Take 60 mg by mouth daily.    [provider]  sertraline (ZOLOFT) 100 MG tablet TAKE ONE TABLET BY MOUTH EVERY  DAY Patient taking differently: Take 100 mg by mouth daily.  11/15/17   Leone Haven, MD  tapentadol (NUCYNTA) 50 MG tablet Take 50 mg by mouth 3 (three) times daily.    [provider]  triamcinolone cream (KENALOG) 0.1 % Apply 1 application topically 2 (two) times daily.    [provider]  trolamine salicylate (ASPERCREME) 10 % cream Apply 1 application topically 3 (three) times daily as needed for muscle pain.     [provider]  verapamil (VERELAN PM) 240 MG 24 hr capsule Take 240 mg by mouth at bedtime.    [provider]    Allergies  Allergen Reactions  . Statins Other (See Comments)    Very bad muscle aches  . Tramadol Itching  .  Levaquin [Levofloxacin In D5w] Diarrhea and Other (See Comments)    Confusion, hallucinations  . Neomycin-Polymyxin-Gramicidin Other (See Comments)  . Penicillins     Did it involve swelling of the face/tongue/throat, SOB, or low BP? Unknown Did it involve sudden or severe rash/hives, skin peeling, or any reaction on the inside of your mouth or nose? Unknown Did you need to seek medical attention at a hospital or doctor's office? Unknown When did it last happen?Unknown If all above answers are "NO", may proceed with cephalosporin use.  . Sulfa Antibiotics   . Tetracyclines & Related     Family History  Problem Relation Age of Onset  . Stroke Father   . Heart disease Sister   . Cancer Brother        colon  . Heart disease Brother   . Cancer Sister        colon  . Heart attack Brother   . Heart attack Son   . Parkinson's disease Son     Social History Social History   Tobacco Use  . Smoking status: Never Smoker  . Smokeless tobacco: Never Used  Substance Use Topics  . Alcohol use: No  . Drug use: No    Review of Systems Constitutional: Negative for fever. Eyes: Negative for visual complaints ENT: Negative for recent illness/congestion Cardiovascular: Negative for chest  pain. Respiratory: Negative for shortness of breath. Gastrointestinal: Negative for abdominal pain.  Patient denies any dysuria. Musculoskeletal: Negative for musculoskeletal complaints Neurological: Negative for headache All other ROS negative  ____________________________________________   PHYSICAL EXAM:  VITAL SIGNS: ED Triage Vitals  Enc Vitals Group     BP 04/12/19 1136 114/69     Pulse Rate 04/12/19 1136 73     Resp 04/12/19 1136 16     Temp 04/12/19 1136 98.8 F (37.1 C)     Temp Source 04/12/19 1136 Oral     SpO2 04/12/19 1136 99 %     Weight 04/12/19 1143 186 lb 15.2 oz (84.8 kg)     Height 04/12/19 1144 5\' 3"  (1.6 m)     Head Circumference --      Peak Flow --      Pain Score --      Pain Loc --      Pain Edu? --      Excl. in [place]? --    Constitutional: Patient is awake and alert, well-appearing and pleasant. Eyes: Normal exam ENT      Head: Normocephalic and atraumatic.      Mouth/Throat: Mucous membranes are moist. Cardiovascular: Normal rate, regular rhythm.  Respiratory: Normal respiratory effort without tachypnea nor retractions. Breath sounds are clear Gastrointestinal: Soft and nontender. No distention Musculoskeletal: Patient does have a wrap to her left lower extremity but is requesting we do not remove it.  Appears well and recently placed. Neurologic:  Normal speech and language. No gross focal neurologic deficits  Skin:  Skin is warm, dry Psychiatric: Mood and affect are normal.  ____________________________________________    EKG  EKG viewed and interpreted by myself shows a sinus rhythm at 71 bpm with a narrow QRS, normal axis, largely normal intervals, nonspecific but no concerning ST changes.  ____________________________________________   INITIAL IMPRESSION / ASSESSMENT AND PLAN / ED COURSE  Pertinent labs & imaging results that were available during my care of the patient were reviewed by me and considered in my medical decision  making (see chart for details).   Patient presents to the emergency department  from a nursing facility with her son for hallucinations over the past several days although the son states today she has been normal with no hallucinations and is acting at her baseline.  Differential would include possible UTI causing hallucinations, antibiotic or medication reaction, insomnia leading to hallucinations.  We will check labs to rule out metabolic or electrolyte abnormalities.  No signs of CVA or concerning symptoms of CVA.  We will send a urinalysis and continue to closely monitor.  Patient and son agreeable.  I spoke to the patient's nurse at [facility] who states that her normal doctor was out of town last week and the nurse practitioner feeling and put her on doxycycline.  They state the patient has a history of medication reactions in the past that been somewhat similar.  They doctor recommended she come to the ER for evaluation.  Again so far her work-up looks normal.  Patient is currently taking doxycycline which started 11/11.  I believe she has been on this medication long enough that it would be safe to discontinue the antibiotic however we will await her urinalysis to ensure that the patient no longer has a urinary tract infection.  Anticipate the patient will do very well going back to her nursing facility, facility agreed with that plan.  Urinalysis shows white cells but no bacteria, no nitrites.  No white blood cell clumps.  I have added a urine culture onto the patient's urinalysis however we will discontinue her doxycycline as she has been on it nearly 9 days at this point.  We will discharge patient back to her nursing facility.  Son agreeable to plan of care.   KESTREL BEEVER was evaluated in Emergency Department on 04/12/2019 for the symptoms described in the history of present illness. She was evaluated in the context of the global COVID-19 pandemic, which necessitated consideration that the  patient might be at risk for infection with the SARS-CoV-2 virus that causes COVID-19. Institutional protocols and algorithms that pertain to the evaluation of patients at risk for COVID-19 are in a state of rapid change based on information released by regulatory bodies including the CDC and federal and state organizations. These policies and algorithms were followed during the patient's care in the ED.  ____________________________________________   FINAL CLINICAL IMPRESSION(S) / ED DIAGNOSES  Hallucinations Medication reaction   Harvest Dark, MD 04/12/19 1533

## 2019-04-13 ENCOUNTER — Other Ambulatory Visit: Payer: Self-pay

## 2019-04-13 ENCOUNTER — Emergency Department
Admission: EM | Admit: 2019-04-13 | Discharge: 2019-04-14 | Disposition: A | Discharge status: Nursing Home (Includes ICF) | Payer: Medicare Other | Attending: Emergency Medicine | Admitting: Emergency Medicine

## 2019-04-13 ENCOUNTER — Encounter: Payer: Self-pay | Admitting: Emergency Medicine

## 2019-04-13 DIAGNOSIS — Z79899 Other long term (current) drug therapy: Secondary | ICD-10-CM | POA: Diagnosis not present

## 2019-04-13 DIAGNOSIS — Y999 Unspecified external cause status: Secondary | ICD-10-CM | POA: Diagnosis not present

## 2019-04-13 DIAGNOSIS — Y9389 Activity, other specified: Secondary | ICD-10-CM | POA: Diagnosis not present

## 2019-04-13 DIAGNOSIS — R4182 Altered mental status, unspecified: Secondary | ICD-10-CM | POA: Diagnosis not present

## 2019-04-13 DIAGNOSIS — N183 Chronic kidney disease, stage 3 unspecified: Secondary | ICD-10-CM | POA: Diagnosis not present

## 2019-04-13 DIAGNOSIS — I129 Hypertensive chronic kidney disease with stage 1 through stage 4 chronic kidney disease, or unspecified chronic kidney disease: Secondary | ICD-10-CM | POA: Diagnosis not present

## 2019-04-13 DIAGNOSIS — W01198A Fall on same level from slipping, tripping and stumbling with subsequent striking against other object, initial encounter: Secondary | ICD-10-CM | POA: Insufficient documentation

## 2019-04-13 DIAGNOSIS — W19XXXA Unspecified fall, initial encounter: Secondary | ICD-10-CM | POA: Diagnosis not present

## 2019-04-13 DIAGNOSIS — J45909 Unspecified asthma, uncomplicated: Secondary | ICD-10-CM | POA: Diagnosis not present

## 2019-04-13 DIAGNOSIS — S51811A Laceration without foreign body of right forearm, initial encounter: Secondary | ICD-10-CM

## 2019-04-13 DIAGNOSIS — I10 Essential (primary) hypertension: Secondary | ICD-10-CM | POA: Diagnosis not present

## 2019-04-13 DIAGNOSIS — J449 Chronic obstructive pulmonary disease, unspecified: Secondary | ICD-10-CM | POA: Insufficient documentation

## 2019-04-13 DIAGNOSIS — Y92129 Unspecified place in nursing home as the place of occurrence of the external cause: Secondary | ICD-10-CM | POA: Diagnosis not present

## 2019-04-13 DIAGNOSIS — S51812A Laceration without foreign body of left forearm, initial encounter: Secondary | ICD-10-CM | POA: Diagnosis not present

## 2019-04-13 DIAGNOSIS — Z7982 Long term (current) use of aspirin: Secondary | ICD-10-CM | POA: Insufficient documentation

## 2019-04-13 NOTE — ED Provider Notes (Signed)
[facility] Emergency Department Provider Note   ____________________________________________   First MD Initiated Contact with Patient 04/13/19 2309     (approximate)  I have reviewed the triage vital signs and the nursing notes.   HISTORY  Chief Complaint Fall  History obtained by patient and her son  HPI Cindy Graham is a 85 y.o. female brought to the ED from [place] status post mechanical fall.  Patient states she was trying to step on a bug, lost her balance and fell, striking her head on the vent.  Presents with skin tear to her right forearm.  Voices no medical complaints.  Denies LOC.  Denies headache, vision changes, neck pain, chest pain, shortness of breath, abdominal pain, nausea or, vomiting or dizziness.  Denies use of anticoagulants.      Past Medical History:  Diagnosis Date   Anxiety    Arthritis    osteo   Asthma    COPD (chronic obstructive pulmonary disease) (HCC)    GERD (gastroesophageal reflux disease)    Heart murmur    Hernia    Hyperlipidemia    Hypertension    Melanoma ([place])    face   Syncope and collapse    Urine incontinence     Patient Active Problem List   Diagnosis Date Noted   Acute kidney injury (AKI) with acute tubular necrosis (ATN) ([place]) 02/27/2019   Stroke (cerebrum) ([place]) 02/22/2019   Cellulitis 06/12/2018   Hyponatremia 11/24/2017   Headache 11/24/2017   Wound of left leg 11/01/2017   Constipation 09/05/2017   Other fatigue 12/22/2016   Tremor 03/29/2016   Amaurosis fugax 12/25/2015   Asthma 09/17/2015   PVD (peripheral vascular disease) ([place]) 05/28/2015   B12 deficiency 08/29/2014   Chronic back pain greater than 3 months duration 04/23/2013   Chronic kidney disease (CKD), stage III (moderate) 04/12/2012   Hyperlipidemia LDL goal < 100 03/05/2012   UTI (urinary tract infection) 07/26/2011   Osteoarthritis 07/06/2011   Hypertension 02/21/2011    Depression/Anxiety 02/21/2011    Past Surgical History:  Procedure Laterality Date   ABDOMINAL HYSTERECTOMY     AMPUTATION TOE Right 06/14/2018   Procedure: AMPUTATION TOE - RIGHT 5TH;  Surgeon: Albertine Patricia, DPM;  Location: [facility] ORS;  Service: Podiatry;  Laterality: Right;   CHOLECYSTECTOMY     HERNIA REPAIR      Prior to Admission medications   Medication Sig Start Date End Date Taking? Authorizing Provider  acetaminophen (TYLENOL) 325 MG tablet Take 650 mg by mouth 2 (two) times daily.    [provider]  acetaminophen (TYLENOL) 500 MG tablet Take 500 mg by mouth every 6 (six) hours as needed for mild pain or moderate pain.     [provider]  alendronate (FOSAMAX) 70 MG tablet Take 70 mg by mouth every Thursday.     [provider]  alum & mag hydroxide-simeth (MINTOX) 200-200-20 MG/5ML suspension Take 30 mLs by mouth 3 (three) times daily as needed for indigestion or heartburn.     [provider]  aspirin 81 MG chewable tablet Chew 81 mg by mouth daily.    [provider]  Biotin 1000 MCG tablet Take 1,000 mcg by mouth daily.      [provider]  enalapril (VASOTEC) 20 MG tablet TAKE ONE TABLET BY MOUTH EVERY DAY Patient taking differently: Take 20 mg by mouth daily.  09/15/17   Leone Haven, MD  Flaxseed, Linseed, (FLAX SEED OIL) 1000  MG CAPS Take 1,000 mg by mouth daily.     [provider]  fluticasone (FLOVENT HFA) 220 MCG/ACT inhaler Inhale 2 puffs into the lungs 2 (two) times daily. 08/12/17   Leone Haven, MD  gabapentin (NEURONTIN) 300 MG capsule Take 1 capsule (300 mg total) by mouth at bedtime. Patient taking differently: Take 300 mg by mouth daily.  12/06/17   Leone Haven, MD  guaifenesin (ROBITUSSIN) 100 MG/5ML syrup Take 200 mg by mouth every 6 (six) hours as needed for cough.     [provider]  lactulose (CHRONULAC) 10 GM/15ML solution Take 30 mLs (20 g total) by mouth  daily as needed for mild constipation. 12/06/17   Leone Haven, MD  Lidocaine 4 % PTCH Apply 1 patch topically daily.    [provider]  loperamide (IMODIUM) 2 MG capsule Take 2 mg by mouth 4 (four) times daily as needed for diarrhea or loose stools.     [provider]  magnesium hydroxide (MILK OF MAGNESIA) 400 MG/5ML suspension Take 30 mLs by mouth daily as needed for mild constipation.    [provider]  meloxicam (MOBIC) 15 MG tablet Take 15 mg by mouth daily.    [provider]  Multiple Vitamin (DAILY VITES) tablet Take 1 tablet by mouth daily.    [provider]  neomycin-bacitracin-polymyxin (NEOSPORIN) ointment Apply 1 application topically daily. Apply to skin tear on right calf    [provider]  nitroGLYCERIN (NITROSTAT) 0.4 MG SL tablet Place 0.4 mg under the tongue every 5 (five) minutes as needed for chest pain.    [provider]  nystatin cream (MYCOSTATIN) Apply 1 application topically as needed (panniculitis). Apply to pannus    [provider]  omeprazole (PRILOSEC) 20 MG capsule TAKE 1 CAPSULE BY MOUTH EVERY DAY. Patient taking differently: Take 20 mg by mouth daily.  11/15/17   Leone Haven, MD  oxybutynin (DITROPAN) 5 MG tablet TAKE ONE TABLET BY MOUTH EVERY DAY Patient taking differently: Take 5 mg by mouth daily.  09/15/17   Leone Haven, MD  propranolol (INDERAL) 60 MG tablet Take 60 mg by mouth daily.    [provider]  sertraline (ZOLOFT) 100 MG tablet TAKE ONE TABLET BY MOUTH EVERY DAY Patient taking differently: Take 100 mg by mouth daily.  11/15/17   Leone Haven, MD  tapentadol (NUCYNTA) 50 MG tablet Take 50 mg by mouth 3 (three) times daily.    [provider]  triamcinolone cream (KENALOG) 0.1 % Apply 1 application topically 2 (two) times daily.    [provider]  trolamine salicylate (ASPERCREME) 10 % cream Apply 1 application topically 3  (three) times daily as needed for muscle pain.     [provider]  verapamil (VERELAN PM) 240 MG 24 hr capsule Take 240 mg by mouth at bedtime.    [provider]    Allergies Statins, Tramadol, Levaquin [levofloxacin in d5w], Neomycin-polymyxin-gramicidin, Penicillins, Sulfa antibiotics, and Tetracyclines & related  Family History  Problem Relation Age of Onset   Stroke Father    Heart disease Sister    Cancer Brother        colon   Heart disease Brother    Cancer Sister        colon   Heart attack Brother    Heart attack Son    Parkinson's disease Son     Social History Social History   Tobacco Use  Smoking status: Never Smoker   Smokeless tobacco: Never Used  Substance Use Topics   Alcohol use: No   Drug use: No    Review of Systems  Constitutional: Positive for mechanical fall.  No fever/chills Eyes: No visual changes. ENT: No sore throat. Cardiovascular: Denies chest pain. Respiratory: Denies shortness of breath. Gastrointestinal: No abdominal pain.  No nausea, no vomiting.  No diarrhea.  No constipation. Genitourinary: Negative for dysuria. Musculoskeletal: Positive for right forearm skin tear.  Negative for back pain. Skin: Negative for rash. Neurological: Negative for headaches, focal weakness or numbness.   ____________________________________________   PHYSICAL EXAM:  VITAL SIGNS: ED Triage Vitals  Enc Vitals Group     BP 04/13/19 2252 (!) 156/51     Pulse Rate 04/13/19 2252 71     Resp 04/13/19 2252 15     Temp 04/13/19 2252 98.5 F (36.9 C)     Temp Source 04/13/19 2252 Oral     SpO2 04/13/19 2252 97 %     Weight 04/13/19 2256 186 lb 15.2 oz (84.8 kg)     Height 04/13/19 2256 5\' 3"  (1.6 m)     Head Circumference --      Peak Flow --      Pain Score 04/13/19 2255 2     Pain Loc --      Pain Edu? --      Excl. in [place]? --     Constitutional: Alert and oriented.  Elderly appearing and in no acute  distress. Eyes: Conjunctivae are normal. PERRL. EOMI. Head: Atraumatic. Nose: Atraumatic. Mouth/Throat: Mucous membranes are moist.  Edentulous. Neck: No stridor.  No cervical spine tenderness to palpation.  No step-offs or deformities noted. Cardiovascular: Normal rate, regular rhythm. Grossly normal heart sounds.  Good peripheral circulation. Respiratory: Normal respiratory effort.  No retractions. Lungs CTAB. Gastrointestinal: Soft and nontender to light or deep palpation. No distention. No abdominal bruits. No CVA tenderness. Musculoskeletal: Left leg wrapped and in orthopedic shoe.  Right forearm skin tear.  No joint effusions. Neurologic: Alert and oriented to person and place.  Normal speech and language. No gross focal neurologic deficits are appreciated.  Skin:  Skin is warm, dry and intact. No rash noted. Psychiatric: Mood and affect are normal. Speech and behavior are normal.  ____________________________________________   LABS (all labs ordered are listed, but only abnormal results are displayed)  Labs Reviewed - No data to display ____________________________________________  EKG  ED ECG REPORT I, Lamija Besse J, the attending physician, personally viewed and interpreted this ECG.   Date: 04/13/2019  EKG Time: 2253  Rate: 67  Rhythm: normal EKG, normal sinus rhythm  Axis: Normal  Intervals:none  ST&T Change: Nonspecific  ____________________________________________  RADIOLOGY  ED MD interpretation: No ICH, stable meningioma  Official radiology report(s): Ct Head Wo Contrast  Result Date: 04/14/2019 CLINICAL DATA:  Altered mental status. Hallucinations. Recent. EXAM: CT HEAD WITHOUT CONTRAST TECHNIQUE: Contiguous axial images were obtained from the base of the skull through the vertex without intravenous contrast. COMPARISON:  Head CT 02/22/2019 brain MRI 02/27/2019 FINDINGS: Brain: No evidence of acute infarction, hemorrhage, hydrocephalus, or extra-axial  collection. Stable age related atrophy. Mild for age chronic small vessel ischemia. Unchanged calcified left frontal meningioma measuring 14 mm without associated mass effect. Vascular: Atherosclerosis of skullbase vasculature without hyperdense vessel or abnormal calcification. Skull: No fracture or focal lesion. Sinuses/Orbits: Paranasal sinuses and mastoid air cells are clear. The visualized orbits are unremarkable. Bilateral cataract resection. Other: Stable calcified sebaceous  cyst in the right frontal scalp. IMPRESSION: 1. No acute intracranial abnormality. 2. Stable age related atrophy and chronic small vessel ischemia. 3. Unchanged small left frontal meningioma. Electronically Signed   By: Keith Rake M.D.   On: 04/14/2019 00:44    ____________________________________________   PROCEDURES  Procedure(s) performed (including Critical Care):  Procedures   ____________________________________________   INITIAL IMPRESSION / ASSESSMENT AND PLAN / ED COURSE  As part of my medical decision making, I reviewed the following data within the [facility] notes reviewed and incorporated, EKG interpreted, Old chart reviewed, Radiograph reviewed and Notes from prior ED visits     Cindy Graham was evaluated in Emergency Department on 04/14/2019 for the symptoms described in the history of present illness. She was evaluated in the context of the global COVID-19 pandemic, which necessitated consideration that the patient might be at risk for infection with the SARS-CoV-2 virus that causes COVID-19. Institutional protocols and algorithms that pertain to the evaluation of patients at risk for COVID-19 are in a state of rapid change based on information released by regulatory bodies including the CDC and federal and state organizations. These policies and algorithms were followed during the patient's care in the ED.    85 year old female who presents status post mechanical  fall.  Differential diagnosis includes but is not limited to [place], CVA, orthopedic fracture, contusion, etc.  I personally reviewed patient's old records and see that she was seen in the ED yesterday for hallucinations.  She had been on doxycycline 9/10 days for UTI.  Laboratory results were unremarkable.  It was decided to discontinue her doxycycline.  Urine demonstrated moderate leukocytes and culture was added.  Urine culture has not yet returned.  Discussed with son who states her hallucinations have cleared so it is possible that patient was actually trying to step on a bug when she lost her balance.  Will obtain CT head to evaluate for intracranial bleeding.   Clinical Course as of Apr 13 48  Nancy Fetter Apr 14, 2019  0048 Patient resting in no acute distress.  Updated patient and son of CT imaging results.  Will discharge back to [facility].  Strict return precautions given.  Both verbalized understanding and agree with plan of care.   [JS]    Clinical Course User Index [JS] Paulette Blanch, MD     ____________________________________________   FINAL CLINICAL IMPRESSION(S) / ED DIAGNOSES  Final diagnoses:  Fall, initial encounter  Skin tear of right forearm without complication, initial encounter     ED Discharge Orders    None       Note:  This document was prepared using Dragon voice recognition software and may include unintentional dictation errors.   Paulette Blanch, MD 04/14/19 [phone]

## 2019-04-13 NOTE — ED Triage Notes (Signed)
EMS pt to rm 25 from [facility] with report of fall trying to step on a bug. Hit head on AC vent. Skin tear to right forarm

## 2019-04-14 ENCOUNTER — Emergency Department: Payer: Medicare Other

## 2019-04-14 DIAGNOSIS — R4182 Altered mental status, unspecified: Secondary | ICD-10-CM | POA: Diagnosis not present

## 2019-04-14 DIAGNOSIS — S51811A Laceration without foreign body of right forearm, initial encounter: Secondary | ICD-10-CM | POA: Diagnosis not present

## 2019-04-14 NOTE — Discharge Instructions (Addendum)
Return to the ER for worsening symptoms, persistent vomiting, difficulty breathing or other concerns. °

## 2019-04-14 NOTE — ED Notes (Signed)
Pt to CT

## 2019-04-15 LAB — URINE CULTURE: Culture: >=100000 — AB

## 2019-04-16 NOTE — Progress Notes (Signed)
Brief Pharmacy Note  Patient is a 85 y/o F with medical history of anxiety, asthma, hypertension, and hyperlipidemia who presented to [facility] ED from [facility] 11/20 c/o hallucinations. Her son, who was present, stated this has happened previously when she was put on antibiotics for urinary tract infection. Recent history of doxycycline started 11/11 for UTI which was discontinued at ED visit. No fevers, abdominal pain, back pain, or dysuria. No leukocytosis. UA with pyuria but no bacteria seen. Patient presented to [facility] ED 11/21 for mechanical fall at which time son reports hallucinations had resolved. She was also negative for fevers and dysuria on second ED presentation.   Urine culture from 11/20 has resulted >100k colonies/mL Klebsiella Pneumoniae (ESBL). Doxycycline does not have activity versus this organism. Considering patient is elderly and a resident of a long-term care facility the prevalence of asymptomatic bacteriuria is high. At ED presentations patient was negative for dysuria, fevers, or other signs of UTI. Spoke with daughter-in-law on the telephone and she reports patient is doing much better and is not having any fevers or discomfort. Guidelines recommend against screening for or treatment of asymptomatic bacteriuria in elderly residents of long-term care facilities. Will not pursue treatment of asymptomatic bacteria.   [place] Resident 16 April 2019

## 2019-06-21 ENCOUNTER — Inpatient Hospital Stay (HOSPITAL_COMMUNITY)
Admission: EM | Admit: 2019-06-21 | Discharge: 2019-06-26 | DRG: 481 | Disposition: A | Discharge status: Skilled Nursing Facility | Payer: Medicare Other | Source: Skilled Nursing Facility | Attending: Internal Medicine | Admitting: Internal Medicine

## 2019-06-21 ENCOUNTER — Inpatient Hospital Stay (HOSPITAL_COMMUNITY)
Admission: AD | Admit: 2019-06-21 | Payer: Medicare Other | Source: Other Acute Inpatient Hospital | Admitting: Internal Medicine

## 2019-06-21 ENCOUNTER — Emergency Department: Payer: Medicare Other

## 2019-06-21 ENCOUNTER — Encounter: Payer: Self-pay | Admitting: Emergency Medicine

## 2019-06-21 ENCOUNTER — Emergency Department
Admission: EM | Admit: 2019-06-21 | Discharge: 2019-06-21 | Disposition: A | Discharge status: Other Acute Inpatient Hospital | Payer: Medicare Other | Attending: Emergency Medicine | Admitting: Emergency Medicine

## 2019-06-21 ENCOUNTER — Other Ambulatory Visit: Payer: Self-pay

## 2019-06-21 DIAGNOSIS — S12000A Unspecified displaced fracture of first cervical vertebra, initial encounter for closed fracture: Secondary | ICD-10-CM | POA: Diagnosis not present

## 2019-06-21 DIAGNOSIS — Z881 Allergy status to other antibiotic agents status: Secondary | ICD-10-CM | POA: Diagnosis not present

## 2019-06-21 DIAGNOSIS — J449 Chronic obstructive pulmonary disease, unspecified: Secondary | ICD-10-CM | POA: Insufficient documentation

## 2019-06-21 DIAGNOSIS — I129 Hypertensive chronic kidney disease with stage 1 through stage 4 chronic kidney disease, or unspecified chronic kidney disease: Secondary | ICD-10-CM | POA: Diagnosis not present

## 2019-06-21 DIAGNOSIS — Y92129 Unspecified place in nursing home as the place of occurrence of the external cause: Secondary | ICD-10-CM | POA: Insufficient documentation

## 2019-06-21 DIAGNOSIS — Z20822 Contact with and (suspected) exposure to covid-19: Secondary | ICD-10-CM | POA: Diagnosis present

## 2019-06-21 DIAGNOSIS — Z8249 Family history of ischemic heart disease and other diseases of the circulatory system: Secondary | ICD-10-CM

## 2019-06-21 DIAGNOSIS — Y92099 Unspecified place in other non-institutional residence as the place of occurrence of the external cause: Secondary | ICD-10-CM

## 2019-06-21 DIAGNOSIS — Z791 Long term (current) use of non-steroidal anti-inflammatories (NSAID): Secondary | ICD-10-CM | POA: Insufficient documentation

## 2019-06-21 DIAGNOSIS — Z882 Allergy status to sulfonamides status: Secondary | ICD-10-CM | POA: Diagnosis not present

## 2019-06-21 DIAGNOSIS — N183 Chronic kidney disease, stage 3 unspecified: Secondary | ICD-10-CM | POA: Insufficient documentation

## 2019-06-21 DIAGNOSIS — S0990XA Unspecified injury of head, initial encounter: Secondary | ICD-10-CM | POA: Insufficient documentation

## 2019-06-21 DIAGNOSIS — N179 Acute kidney failure, unspecified: Secondary | ICD-10-CM | POA: Diagnosis present

## 2019-06-21 DIAGNOSIS — Z7982 Long term (current) use of aspirin: Secondary | ICD-10-CM | POA: Insufficient documentation

## 2019-06-21 DIAGNOSIS — K219 Gastro-esophageal reflux disease without esophagitis: Secondary | ICD-10-CM | POA: Diagnosis present

## 2019-06-21 DIAGNOSIS — Z419 Encounter for procedure for purposes other than remedying health state, unspecified: Secondary | ICD-10-CM

## 2019-06-21 DIAGNOSIS — Z79899 Other long term (current) drug therapy: Secondary | ICD-10-CM | POA: Diagnosis not present

## 2019-06-21 DIAGNOSIS — F419 Anxiety disorder, unspecified: Secondary | ICD-10-CM | POA: Diagnosis present

## 2019-06-21 DIAGNOSIS — S12030A Displaced posterior arch fracture of first cervical vertebra, initial encounter for closed fracture: Secondary | ICD-10-CM | POA: Diagnosis present

## 2019-06-21 DIAGNOSIS — S79911A Unspecified injury of right hip, initial encounter: Secondary | ICD-10-CM | POA: Diagnosis present

## 2019-06-21 DIAGNOSIS — R509 Fever, unspecified: Secondary | ICD-10-CM | POA: Diagnosis not present

## 2019-06-21 DIAGNOSIS — D62 Acute posthemorrhagic anemia: Secondary | ICD-10-CM | POA: Diagnosis not present

## 2019-06-21 DIAGNOSIS — I10 Essential (primary) hypertension: Secondary | ICD-10-CM | POA: Diagnosis present

## 2019-06-21 DIAGNOSIS — Z66 Do not resuscitate: Secondary | ICD-10-CM | POA: Diagnosis not present

## 2019-06-21 DIAGNOSIS — Z885 Allergy status to narcotic agent status: Secondary | ICD-10-CM | POA: Diagnosis not present

## 2019-06-21 DIAGNOSIS — Z888 Allergy status to other drugs, medicaments and biological substances status: Secondary | ICD-10-CM | POA: Diagnosis not present

## 2019-06-21 DIAGNOSIS — S12100A Unspecified displaced fracture of second cervical vertebra, initial encounter for closed fracture: Secondary | ICD-10-CM | POA: Diagnosis present

## 2019-06-21 DIAGNOSIS — Z7983 Long term (current) use of bisphosphonates: Secondary | ICD-10-CM | POA: Diagnosis not present

## 2019-06-21 DIAGNOSIS — M1990 Unspecified osteoarthritis, unspecified site: Secondary | ICD-10-CM | POA: Diagnosis present

## 2019-06-21 DIAGNOSIS — J45909 Unspecified asthma, uncomplicated: Secondary | ICD-10-CM | POA: Diagnosis not present

## 2019-06-21 DIAGNOSIS — W010XXA Fall on same level from slipping, tripping and stumbling without subsequent striking against object, initial encounter: Secondary | ICD-10-CM | POA: Diagnosis present

## 2019-06-21 DIAGNOSIS — R21 Rash and other nonspecific skin eruption: Secondary | ICD-10-CM | POA: Diagnosis not present

## 2019-06-21 DIAGNOSIS — S72009A Fracture of unspecified part of neck of unspecified femur, initial encounter for closed fracture: Secondary | ICD-10-CM

## 2019-06-21 DIAGNOSIS — W07XXXA Fall from chair, initial encounter: Secondary | ICD-10-CM | POA: Diagnosis not present

## 2019-06-21 DIAGNOSIS — Z880 Allergy status to penicillin: Secondary | ICD-10-CM

## 2019-06-21 DIAGNOSIS — Y9389 Activity, other specified: Secondary | ICD-10-CM | POA: Insufficient documentation

## 2019-06-21 DIAGNOSIS — Z85820 Personal history of malignant melanoma of skin: Secondary | ICD-10-CM

## 2019-06-21 DIAGNOSIS — Y999 Unspecified external cause status: Secondary | ICD-10-CM | POA: Diagnosis not present

## 2019-06-21 DIAGNOSIS — S72001A Fracture of unspecified part of neck of right femur, initial encounter for closed fracture: Secondary | ICD-10-CM | POA: Diagnosis not present

## 2019-06-21 DIAGNOSIS — S72002A Fracture of unspecified part of neck of left femur, initial encounter for closed fracture: Secondary | ICD-10-CM | POA: Diagnosis present

## 2019-06-21 DIAGNOSIS — E785 Hyperlipidemia, unspecified: Secondary | ICD-10-CM | POA: Diagnosis present

## 2019-06-21 DIAGNOSIS — W19XXXA Unspecified fall, initial encounter: Secondary | ICD-10-CM

## 2019-06-21 DIAGNOSIS — S72141A Displaced intertrochanteric fracture of right femur, initial encounter for closed fracture: Secondary | ICD-10-CM | POA: Diagnosis not present

## 2019-06-21 LAB — CBC WITH DIFFERENTIAL/PLATELET
Abs Immature Granulocytes: 0.07 10*3/uL (ref 0.00–0.07)
Basophils Absolute: 0.1 10*3/uL (ref 0.0–0.1)
Basophils Relative: 1 %
Eosinophils Absolute: 0.3 10*3/uL (ref 0.0–0.5)
Eosinophils Relative: 4 %
HCT: 42.6 % (ref 36.0–46.0)
Hemoglobin: 13.5 g/dL (ref 12.0–15.0)
Immature Granulocytes: 1 %
Lymphocytes Relative: 34 %
Lymphs Abs: 2.8 10*3/uL (ref 0.7–4.0)
MCH: 30.3 pg (ref 26.0–34.0)
MCHC: 31.7 g/dL (ref 30.0–36.0)
MCV: 95.7 fL (ref 80.0–100.0)
Monocytes Absolute: 0.8 10*3/uL (ref 0.1–1.0)
Monocytes Relative: 10 %
Neutro Abs: 4.1 10*3/uL (ref 1.7–7.7)
Neutrophils Relative %: 50 %
Platelets: 262 10*3/uL (ref 150–400)
RBC: 4.45 MIL/uL (ref 3.87–5.11)
RDW: 14.3 % (ref 11.5–15.5)
WBC: 8.1 10*3/uL (ref 4.0–10.5)
nRBC: 0 % (ref 0.0–0.2)

## 2019-06-21 LAB — SURGICAL PCR SCREEN
MRSA, PCR: NEGATIVE
Staphylococcus aureus: NEGATIVE

## 2019-06-21 LAB — BASIC METABOLIC PANEL
Anion gap: 10 (ref 5–15)
BUN: 23 mg/dL (ref 8–23)
CO2: 29 mmol/L (ref 22–32)
Calcium: 9.5 mg/dL (ref 8.9–10.3)
Chloride: 100 mmol/L (ref 98–111)
Creatinine, Ser: 0.96 mg/dL (ref 0.44–1.00)
GFR calc Af Amer: 58 mL/min — ABNORMAL LOW (ref >60)
GFR calc non Af Amer: 50 mL/min — ABNORMAL LOW (ref >60)
Glucose, Bld: 109 mg/dL — ABNORMAL HIGH (ref 70–99)
Potassium: 4.1 mmol/L (ref 3.5–5.1)
Sodium: 139 mmol/L (ref 135–145)

## 2019-06-21 LAB — RESPIRATORY PANEL BY RT PCR (FLU A&B, COVID)
Influenza A by PCR: NEGATIVE
Influenza B by PCR: NEGATIVE
SARS Coronavirus 2 by RT PCR: NEGATIVE

## 2019-06-21 LAB — PROTIME-INR
INR: 0.9 (ref 0.8–1.2)
Prothrombin Time: 12.3 seconds (ref 11.4–15.2)

## 2019-06-21 LAB — ABO/RH: ABO/RH(D): O POS

## 2019-06-21 MED ORDER — IOHEXOL 350 MG/ML SOLN
75.0000 mL | Freq: Once | INTRAVENOUS | Status: AC | PRN
  Administered 2019-06-21: 75 mL via INTRAVENOUS

## 2019-06-21 MED ORDER — BUDESONIDE 0.25 MG/2ML IN SUSP
0.2500 mg | Freq: Two times a day (BID) | RESPIRATORY_TRACT | Status: DC
  Administered 2019-06-21 – 2019-06-26 (×9): 0.25 mg via RESPIRATORY_TRACT
  Filled 2019-06-21 (×10): qty 2

## 2019-06-21 MED ORDER — PANTOPRAZOLE SODIUM 40 MG PO TBEC
40.0000 mg | DELAYED_RELEASE_TABLET | Freq: Every day | ORAL | Status: DC
  Administered 2019-06-23 – 2019-06-26 (×4): 40 mg via ORAL
  Filled 2019-06-21 (×4): qty 1

## 2019-06-21 MED ORDER — GABAPENTIN 300 MG PO CAPS
300.0000 mg | ORAL_CAPSULE | Freq: Every day | ORAL | Status: DC
  Administered 2019-06-21 – 2019-06-25 (×5): 300 mg via ORAL
  Filled 2019-06-21 (×5): qty 1

## 2019-06-21 MED ORDER — PROPRANOLOL HCL 60 MG PO TABS
60.0000 mg | ORAL_TABLET | Freq: Every day | ORAL | Status: DC
  Filled 2019-06-21 (×2): qty 1

## 2019-06-21 MED ORDER — MORPHINE SULFATE (PF) 2 MG/ML IV SOLN: 0.5000 mg | INTRAVENOUS | Status: DC | PRN

## 2019-06-21 MED ORDER — ASCORBIC ACID 500 MG PO TABS
500.0000 mg | ORAL_TABLET | Freq: Two times a day (BID) | ORAL | Status: DC
  Administered 2019-06-21 – 2019-06-26 (×9): 500 mg via ORAL
  Filled 2019-06-21 (×9): qty 1

## 2019-06-21 MED ORDER — HEPARIN SODIUM (PORCINE) 5000 UNIT/ML IJ SOLN
5000.0000 [IU] | Freq: Two times a day (BID) | INTRAMUSCULAR | Status: DC
  Administered 2019-06-21: 22:00:00 5000 [IU] via SUBCUTANEOUS
  Filled 2019-06-21: qty 1

## 2019-06-21 MED ORDER — ASPIRIN 325 MG PO TABS
325.0000 mg | ORAL_TABLET | Freq: Every day | ORAL | Status: DC
  Administered 2019-06-21: 325 mg via ORAL
  Filled 2019-06-21: qty 1

## 2019-06-21 MED ORDER — TRANEXAMIC ACID-NACL 1000-0.7 MG/100ML-% IV SOLN: 1000.0000 mg | INTRAVENOUS | Status: DC

## 2019-06-21 MED ORDER — POVIDONE-IODINE 10 % EX SWAB: 2.0000 "application " | Freq: Once | CUTANEOUS | Status: DC

## 2019-06-21 MED ORDER — HYDROCODONE-ACETAMINOPHEN 5-325 MG PO TABS
1.0000 | ORAL_TABLET | Freq: Four times a day (QID) | ORAL | Status: DC | PRN
  Administered 2019-06-21: 2 via ORAL
  Filled 2019-06-21: qty 2

## 2019-06-21 MED ORDER — FLAX SEED OIL 1000 MG PO CAPS: 1000.0000 mg | ORAL_CAPSULE | Freq: Every day | ORAL | Status: DC

## 2019-06-21 MED ORDER — MORPHINE SULFATE (PF) 4 MG/ML IV SOLN
4.0000 mg | Freq: Once | INTRAVENOUS | Status: AC
  Administered 2019-06-21: 4 mg via INTRAVENOUS
  Filled 2019-06-21: qty 1

## 2019-06-21 MED ORDER — NYSTATIN 100000 UNIT/GM EX CREA
1.0000 "application " | TOPICAL_CREAM | CUTANEOUS | Status: DC | PRN
  Administered 2019-06-24: 1 via TOPICAL
  Filled 2019-06-21: qty 15

## 2019-06-21 MED ORDER — HYDRALAZINE HCL 10 MG PO TABS: 10.0000 mg | ORAL_TABLET | Freq: Four times a day (QID) | ORAL | Status: DC | PRN

## 2019-06-21 MED ORDER — LOPERAMIDE HCL 2 MG PO CAPS: 2.0000 mg | ORAL_CAPSULE | Freq: Four times a day (QID) | ORAL | Status: DC | PRN

## 2019-06-21 MED ORDER — ADULT MULTIVITAMIN W/MINERALS CH
1.0000 | ORAL_TABLET | Freq: Every day | ORAL | Status: DC
  Administered 2019-06-23 – 2019-06-26 (×4): 1 via ORAL
  Filled 2019-06-21 (×5): qty 1

## 2019-06-21 MED ORDER — SERTRALINE HCL 100 MG PO TABS
100.0000 mg | ORAL_TABLET | Freq: Every day | ORAL | Status: DC
  Administered 2019-06-23 – 2019-06-26 (×4): 100 mg via ORAL
  Filled 2019-06-21 (×4): qty 1

## 2019-06-21 MED ORDER — LACTULOSE 10 GM/15ML PO SOLN: 20.0000 g | Freq: Every day | ORAL | Status: DC | PRN

## 2019-06-21 MED ORDER — TAPENTADOL HCL 50 MG PO TABS
50.0000 mg | ORAL_TABLET | Freq: Three times a day (TID) | ORAL | Status: DC
  Administered 2019-06-21 – 2019-06-26 (×12): 50 mg via ORAL
  Filled 2019-06-21 (×12): qty 1

## 2019-06-21 MED ORDER — GUAIFENESIN 100 MG/5ML PO SYRP
200.0000 mg | ORAL_SOLUTION | Freq: Four times a day (QID) | ORAL | Status: DC | PRN
  Filled 2019-06-21: qty 10

## 2019-06-21 MED ORDER — ACETAMINOPHEN 500 MG PO TABS
1000.0000 mg | ORAL_TABLET | Freq: Once | ORAL | Status: DC
  Filled 2019-06-21: qty 2

## 2019-06-21 MED ORDER — CEFAZOLIN SODIUM-DEXTROSE 2-4 GM/100ML-% IV SOLN
2.0000 g | INTRAVENOUS | Status: AC
  Administered 2019-06-22: 08:00:00 2 g via INTRAVENOUS
  Filled 2019-06-21: qty 100

## 2019-06-21 MED ORDER — NITROGLYCERIN 0.4 MG SL SUBL: 0.4000 mg | SUBLINGUAL_TABLET | SUBLINGUAL | Status: DC | PRN

## 2019-06-21 MED ORDER — VERAPAMIL HCL ER 240 MG PO TBCR
240.0000 mg | EXTENDED_RELEASE_TABLET | Freq: Every day | ORAL | Status: DC
  Administered 2019-06-21 – 2019-06-25 (×5): 240 mg via ORAL
  Filled 2019-06-21 (×5): qty 1

## 2019-06-21 MED ORDER — TRANEXAMIC ACID 1000 MG/10ML IV SOLN
2000.0000 mg | INTRAVENOUS | Status: DC
  Filled 2019-06-21: qty 20

## 2019-06-21 MED ORDER — ENSURE PRE-SURGERY PO LIQD
296.0000 mL | Freq: Once | ORAL | Status: AC
  Filled 2019-06-21: qty 296

## 2019-06-21 MED ORDER — ALUM & MAG HYDROXIDE-SIMETH 200-200-20 MG/5ML PO SUSP: 30.0000 mL | Freq: Three times a day (TID) | ORAL | Status: DC | PRN

## 2019-06-21 MED ORDER — MAGNESIUM HYDROXIDE 400 MG/5ML PO SUSP: 30.0000 mL | Freq: Every day | ORAL | Status: DC | PRN

## 2019-06-21 MED ORDER — ZINC CHELATED 50 MG PO TABS: 50.0000 mg | ORAL_TABLET | Freq: Every day | ORAL | Status: DC

## 2019-06-21 MED ORDER — OXYBUTYNIN CHLORIDE 5 MG PO TABS
5.0000 mg | ORAL_TABLET | Freq: Every day | ORAL | Status: DC
  Administered 2019-06-23 – 2019-06-26 (×4): 5 mg via ORAL
  Filled 2019-06-21 (×4): qty 1

## 2019-06-21 MED ORDER — ALENDRONATE SODIUM 70 MG PO TABS: 70.0000 mg | ORAL_TABLET | ORAL | Status: DC

## 2019-06-21 MED ORDER — BIOTIN 1000 MCG PO TABS: 1000.0000 ug | ORAL_TABLET | Freq: Every day | ORAL | Status: DC

## 2019-06-21 MED ORDER — ENALAPRIL MALEATE 20 MG PO TABS
20.0000 mg | ORAL_TABLET | Freq: Every day | ORAL | Status: DC
  Filled 2019-06-21: qty 1

## 2019-06-21 NOTE — ED Notes (Addendum)
Patient placed on 2L of O2 via [place] for sats at 87-88% that began prior to morphine administration. MD aware.

## 2019-06-21 NOTE — ED Notes (Signed)
Patient taken to CT.

## 2019-06-21 NOTE — ED Notes (Signed)
[place]

## 2019-06-21 NOTE — H&P (Signed)
History and Physical    Cindy Graham [id number] DOB: March 03, 1922 DOA: 06/21/2019  PCP: [facility] (Confirm with patient/family/NH records and if not entered, this has to be entered at [facility] point of entry) Patient coming from: SNF  I have personally briefly reviewed patient's old medical records in [place]  Chief Complaint: I fell  HPI: Cindy Graham is a 86 y.o. female with medical history significant for HTN,  who presents with right hip fracture s/p mechanical fall at assisted living facility.  Had a episode of fall while transferring from bed to wheelchair, and hit her head but denies LOC.  Denies fever/chills/nausea/vomiting. Walks with assistive devices at baseline (walker, cane, wheelchair). ED Course: CT showing posterior arch of C1 and right C1 fracture, and right intertrochanteric femur fracture.   Review of Systems: As per HPI otherwise 10 point review of systems negative.    Past Medical History:  Diagnosis Date  . Anxiety   . Arthritis    osteo  . Asthma   . COPD (chronic obstructive pulmonary disease) ([place])   . GERD (gastroesophageal reflux disease)   . Heart murmur   . Hernia   . Hyperlipidemia   . Hypertension   . Melanoma ([place])    face  . Syncope and collapse   . Urine incontinence     Past Surgical History:  Procedure Laterality Date  . ABDOMINAL HYSTERECTOMY    . AMPUTATION TOE Right 06/14/2018   Procedure: AMPUTATION TOE - RIGHT 5TH;  Surgeon: Albertine Patricia, DPM;  Location: [facility] ORS;  Service: Podiatry;  Laterality: Right;  . CHOLECYSTECTOMY    . HERNIA REPAIR       reports that she has never smoked. She has never used smokeless tobacco. She reports that she does not drink alcohol or use drugs.  Allergies  Allergen Reactions  . Statins Other (See Comments)    Very bad muscle aches  . Tramadol Itching  . Levaquin [Levofloxacin In D5w] Diarrhea and Other (See Comments)    Confusion, hallucinations  .  Neomycin-Polymyxin-Gramicidin Other (See Comments)  . Penicillins     Did it involve swelling of the face/tongue/throat, SOB, or low BP? Unknown Did it involve sudden or severe rash/hives, skin peeling, or any reaction on the inside of your mouth or nose? Unknown Did you need to seek medical attention at a hospital or doctor's office? Unknown When did it last happen?Unknown If all above answers are "NO", may proceed with cephalosporin use.  . Sulfa Antibiotics   . Tetracyclines & Related     Family History  Problem Relation Age of Onset  . Stroke Father   . Heart disease Sister   . Cancer Brother        colon  . Heart disease Brother   . Cancer Sister        colon  . Heart attack Brother   . Heart attack Son   . Parkinson's disease Son       Prior to Admission medications   Medication Sig Start Date End Date Taking? Authorizing Provider  acetaminophen (TYLENOL) 325 MG tablet Take 650 mg by mouth 2 (two) times daily.    [provider]  acetaminophen (TYLENOL) 500 MG tablet Take 500 mg by mouth every 6 (six) hours as needed for mild pain or moderate pain.     [provider]  alendronate (FOSAMAX) 70 MG tablet Take 70 mg by mouth every Thursday.     [provider]  alum & mag hydroxide-simeth (MINTOX) [id number] MG/5ML suspension Take 30 mLs by mouth 3 (three) times daily as needed for indigestion or heartburn.     [provider]  ascorbic acid (VITAMIN C) 250 MG tablet Take 500 mg by mouth 2 (two) times daily.    [provider]  aspirin 325 MG tablet Take 325 mg by mouth at bedtime.    [provider]  Biotin 1000 MCG tablet Take 1,000 mcg by mouth daily.      [provider]  enalapril (VASOTEC) 20 MG tablet TAKE ONE TABLET BY MOUTH EVERY DAY Patient taking differently: Take 20 mg by mouth daily.  09/15/17   Leone Haven, MD  Flaxseed, Linseed, (FLAX SEED OIL) 1000 MG CAPS Take 1,000 mg by mouth daily.      [provider]  fluticasone (FLOVENT HFA) 220 MCG/ACT inhaler Inhale 2 puffs into the lungs 2 (two) times daily. 08/12/17   Leone Haven, MD  gabapentin (NEURONTIN) 300 MG capsule Take 1 capsule (300 mg total) by mouth at bedtime. Patient taking differently: Take 300 mg by mouth daily.  12/06/17   Leone Haven, MD  guaifenesin (ROBITUSSIN) 100 MG/5ML syrup Take 200 mg by mouth every 6 (six) hours as needed for cough.     [provider]  lactulose (CHRONULAC) 10 GM/15ML solution Take 30 mLs (20 g total) by mouth daily as needed for mild constipation. 12/06/17   Leone Haven, MD  loperamide (IMODIUM) 2 MG capsule Take 2 mg by mouth 4 (four) times daily as needed for diarrhea or loose stools.     [provider]  magnesium hydroxide (MILK OF MAGNESIA) 400 MG/5ML suspension Take 30 mLs by mouth daily as needed for mild constipation.    [provider]  Multiple Vitamin (DAILY VITES) tablet Take 1 tablet by mouth daily.    [provider]  nitroGLYCERIN (NITROSTAT) 0.4 MG SL tablet Place 0.4 mg under the tongue every 5 (five) minutes as needed for chest pain.    [provider]  nystatin (MYCOSTATIN) 100000 UNIT/ML suspension Take 4 mLs by mouth 4 (four) times daily. 06/20/19 06/26/19  [provider]  nystatin (MYCOSTATIN/NYSTOP) powder Apply 1 application topically 2 (two) times daily.    [provider]  nystatin cream (MYCOSTATIN) Apply 1 application topically as needed (panniculitis). Apply to pannus    [provider]  omeprazole (PRILOSEC) 20 MG capsule TAKE 1 CAPSULE BY MOUTH EVERY DAY. Patient taking differently: Take 20 mg by mouth daily.  11/15/17   Leone Haven, MD  oxybutynin (DITROPAN) 5 MG tablet TAKE ONE TABLET BY MOUTH EVERY DAY Patient taking differently: Take 5 mg by mouth daily.  09/15/17   Leone Haven, MD  propranolol (INDERAL) 60 MG tablet Take 60 mg by mouth daily.    [provider]  sertraline (ZOLOFT) 100 MG tablet TAKE ONE TABLET BY MOUTH EVERY DAY Patient taking differently: Take 100 mg by mouth daily.  11/15/17   Leone Haven, MD  tapentadol (NUCYNTA) 50 MG tablet Take 50 mg by mouth 3 (three) times daily.    [provider]  verapamil (VERELAN PM) 240 MG 24 hr capsule Take 240 mg by mouth at bedtime.    [provider]  Zinc Chelated 50 MG TABS Take 50 mg by mouth daily.    [provider]    Physical Exam: Vitals:   06/21/19 1727 06/21/19 1744 06/21/19 1950  BP: Marland Kitchen)  196/70  (!) 170/91  Pulse: 90  (!) 102  Temp: 98.4 F (36.9 C)  98.7 F (37.1 C)  TempSrc: Oral  Oral  SpO2: 96%  100%  Weight:  68.3 kg     Constitutional: NAD, calm, comfortable Vitals:   06/21/19 1727 06/21/19 1744 06/21/19 1950  BP: (!) 196/70  (!) 170/91  Pulse: 90  (!) 102  Temp: 98.4 F (36.9 C)  98.7 F (37.1 C)  TempSrc: Oral  Oral  SpO2: 96%  100%  Weight:  68.3 kg    Eyes: PERRL, lids and conjunctivae normal ENMT: Mucous membranes are moist. Posterior pharynx clear of any exudate or lesions.Normal dentition.  Neck: In neck collar, supple, no masses, no thyromegaly Respiratory: clear to auscultation bilaterally, no wheezing, no crackles. Normal respiratory effort. No accessory muscle use.  Cardiovascular: Regular rate and rhythm, no murmurs / rubs / gallops. No extremity edema. 2+ pedal pulses. No carotid bruits.  Abdomen: no tenderness, no masses palpated. No hepatosplenomegaly. Bowel sounds positive.  Musculoskeletal: rotate and shortened right leg.  Skin: no rashes, lesions, ulcers. No induration Neurologic: CN 2-12 grossly intact. Sensation intact, DTR normal. Strength 5/5 in all 4.  Psychiatric: Normal judgment and insight. Alert and oriented x 3. Normal mood.     Labs on Admission: I have personally reviewed following labs and imaging studies  CBC: Recent Labs  Lab 06/21/19 1059  WBC 8.1  NEUTROABS 4.1  HGB  13.5  HCT 42.6  MCV 95.7  PLT [id number]   Basic Metabolic Panel: Recent Labs  Lab 06/21/19 1059  NA 139  K 4.1  CL 100  CO2 29  GLUCOSE 109*  BUN 23  CREATININE 0.96  CALCIUM 9.5   GFR: Estimated Creatinine Clearance: 32.5 mL/min (by C-G formula based on SCr of 0.96 mg/dL). Liver Function Tests: No results for input(s): AST, ALT, ALKPHOS, BILITOT, PROT, ALBUMIN in the last 168 hours. No results for input(s): LIPASE, AMYLASE in the last 168 hours. No results for input(s): AMMONIA in the last 168 hours. Coagulation Profile: No results for input(s): INR, PROTIME in the last 168 hours. Cardiac Enzymes: No results for input(s): CKTOTAL, CKMB, CKMBINDEX, TROPONINI in the last 168 hours. BNP (last 3 results) No results for input(s): PROBNP in the last 8760 hours. HbA1C: No results for input(s): HGBA1C in the last 72 hours. CBG: No results for input(s): GLUCAP in the last 168 hours. Lipid Profile: No results for input(s): CHOL, HDL, LDLCALC, TRIG, CHOLHDL, LDLDIRECT in the last 72 hours. Thyroid Function Tests: No results for input(s): TSH, T4TOTAL, FREET4, T3FREE, THYROIDAB in the last 72 hours. Anemia Panel: No results for input(s): VITAMINB12, FOLATE, FERRITIN, TIBC, IRON, RETICCTPCT in the last 72 hours. Urine analysis:    Component Value Date/Time   COLORURINE YELLOW (A) 04/12/2019 1407   APPEARANCEUR HAZY (A) 04/12/2019 1407   APPEARANCEUR Cloudy (A) 11/14/2017 1411   LABSPEC 1.013 04/12/2019 1407   LABSPEC 1.018 06/01/2013 0933   PHURINE 5.0 04/12/2019 1407   GLUCOSEU NEGATIVE 04/12/2019 1407   GLUCOSEU NEGATIVE 06/17/2014 1123   HGBUR NEGATIVE 04/12/2019 1407   BILIRUBINUR NEGATIVE 04/12/2019 1407   BILIRUBINUR small 11/28/2017 1207   BILIRUBINUR Negative 11/14/2017 1411   BILIRUBINUR Negative 06/01/2013 0933   KETONESUR 5 (A) 04/12/2019 1407   PROTEINUR NEGATIVE 04/12/2019 1407   UROBILINOGEN 0.2 11/28/2017 1207   UROBILINOGEN 0.2 06/17/2014 1123   NITRITE  NEGATIVE 04/12/2019 1407   LEUKOCYTESUR MODERATE (A) 04/12/2019 1407   LEUKOCYTESUR 2+ 06/01/2013 0933  Radiological Exams on Admission: DG Chest 1 View  Result Date: 06/21/2019 CLINICAL DATA:  Fall.  Right hip fracture. EXAM: CHEST  1 VIEW COMPARISON:  Chest x-ray dated February 27, 2019. FINDINGS: The heart size and mediastinal contours are within normal limits. Normal pulmonary vascularity. No focal consolidation, pleural effusion, or pneumothorax. Unchanged elevation of the right hemidiaphragm. No acute osseous abnormality. IMPRESSION: No active disease. Electronically Signed   By: Titus Dubin M.D.   On: 06/21/2019 12:54   DG Knee 2 Views Right  Result Date: 06/21/2019 CLINICAL DATA:  Right knee pain after fall. EXAM: RIGHT KNEE - 1-2 VIEW COMPARISON:  None. FINDINGS: No acute fracture or dislocation. No large joint effusion, although evaluation is limited due to suboptimal positioning on the cross-table lateral view. Moderate to severe medial compartment joint space narrowing. Medial and lateral compartment marginal osteophytes. Osteopenia. Vascular calcifications. IMPRESSION: 1.  No acute osseous abnormality. 2. Moderate to severe medial compartment osteoarthritis. Electronically Signed   By: Titus Dubin M.D.   On: 06/21/2019 12:52   CT Head Wo Contrast  Result Date: 06/21/2019 CLINICAL DATA:  Head trauma EXAM: CT HEAD WITHOUT CONTRAST TECHNIQUE: Contiguous axial images were obtained from the base of the skull through the vertex without intravenous contrast. COMPARISON:  04/14/2019 FINDINGS: Brain: There is no acute intracranial hemorrhage, mass-effect, or edema. Gray-white differentiation is preserved. There is no extra-axial fluid collection. Stable left frontal convexity calcified meningioma. Ventricles and sulci are stable in size and configuration. Patchy hypoattenuation in the supratentorial white matter is nonspecific but likely reflects stable chronic microvascular ischemic  changes. Vascular: There is atherosclerotic calcification at the skull base. Skull: Right frontal calvarium osteoma.  Otherwise unremarkable. Sinuses/Orbits: No acute finding. Other: None. IMPRESSION: No evidence of acute intracranial injury. Stable chronic findings detailed above. Electronically Signed   By: Macy Mis M.D.   On: 06/21/2019 12:24   CT Angio Neck W and/or Wo Contrast  Result Date: 06/21/2019 CLINICAL DATA:  Cervical spine fracture EXAM: CT ANGIOGRAPHY NECK TECHNIQUE: Multidetector CT imaging of the neck was performed using the standard protocol during bolus administration of intravenous contrast. Multiplanar CT image reconstructions and MIPs were obtained to evaluate the vascular anatomy. Carotid stenosis measurements (when applicable) are obtained utilizing NASCET criteria, using the distal internal carotid diameter as the denominator. CONTRAST:  57mL OMNIPAQUE IOHEXOL 350 MG/ML SOLN COMPARISON:  None. FINDINGS: Aortic arch: Calcified plaque along the arch and great vessel origins, which are patent. Right carotid system: Patent. No measurable stenosis at the ICA origin. Left carotid system: Patent. Mild calcified plaque at the bifurcation. No measurable stenosis of the ICA origin. Vertebral arteries: Extracranial vertebral arteries are patent. There is mild luminal irregularity of the right vertebral artery at the C2 level in proximity to fractures. Suspected sub 2 mm superiorly directed pseudoaneurysm (best seen on series 7, image 123). Skeleton: Cervical spine fractures as previously described. Other neck: No mass or adenopathy. Upper chest: Included upper lungs are clear. IMPRESSION: Suspected focal right vertebral traumatic injury at the C2 level with tiny pseudoaneurysm. Electronically Signed   By: Macy Mis M.D.   On: 06/21/2019 15:28   CT Cervical Spine Wo Contrast  Result Date: 06/21/2019 CLINICAL DATA:  Fall EXAM: CT CERVICAL SPINE WITHOUT CONTRAST TECHNIQUE: Multidetector  CT imaging of the cervical spine was performed without intravenous contrast. Multiplanar CT image reconstructions were also generated. COMPARISON:  2019 FINDINGS: Alignment: Stable. Skull base and vertebrae: Acute nondisplaced fracture through the posterior arch of C1 on the  left. There is distraction at the right lateral mass C1-C2 articulation. Acute fractures are present through the right posterior arch of C1 as well as the right C1 transverse foramen. Comminuted acute fractures through the right transverse process and transverse foramen of C2. Vertebral body heights are preserved. Soft tissues and spinal canal: Stable retrodental likely degenerative soft tissue. Disc levels: Insert degenerative. There is no high-grade osseous encroachment on the spinal canal. Upper chest: No acute abnormality. Other: None. IMPRESSION: Acute fractures of the posterior arch of C1 and right C1 and C2 transverse processes and transverse foramina. There is distraction of the right C1-C2 articulation. Vascular imaging recommended to exclude vertebral artery injury. Electronically Signed   By: Macy Mis M.D.   On: 06/21/2019 12:40   DG Hip Unilat W or Wo Pelvis 2-3 Views Right  Result Date: 06/21/2019 CLINICAL DATA:  Right hip pain after fall. EXAM: DG HIP (WITH OR WITHOUT PELVIS) 2-3V RIGHT COMPARISON:  November 03, 2017. FINDINGS: Acute angulated right intertrochanteric femur fracture. No dislocation. The pubic symphysis and sacroiliac joints are intact. Osteopenia. IMPRESSION: Acute angulated right intertrochanteric femur fracture. Electronically Signed   By: Titus Dubin M.D.   On: 06/21/2019 12:50    EKG: Independently reviewed. Question of shortened PR interval  Assessment/Plan Active Problems:   Hip fracture, unspecified laterality, closed, initial encounter ([place])   Hip fracture requiring operative repair, left, closed, initial encounter ([place])   Displaced intertrochanteric fracture of right femur, initial  encounter for closed fracture ([place])  Acute fracture of posterior arch of C1, right C1 and C2 TPs that extend to transverse foramina.  Neurosurgery on board No NS intervention as per NS team Aspen C collar at all times F/U at discharge in 1-2 weeks for monitoring  Right hip fracture ORIF in AM DVT prophylaxis PT evaluation  HTN Continue home meds Add PRN Hydralazine  COPD No acute exacerbation, continue maintanence meds.   DVT prophylaxis: Heparin subcu Code Status: DNR Family Communication: Patient son Disposition Plan: Inpatient rehab versus SNF Consults called: Neurosurgery, orthopedic surgery Admission status: Telemetry admission   Lequita Halt MD [facility] Pager [phone]  If 7PM-7AM, please contact night-coverage [email or web address] Password [facility]  06/21/2019, 8:23 PM

## 2019-06-21 NOTE — ED Notes (Addendum)
Purewick placed on patient after patient's clothes were removed and linens were changed. Patient was log rolled maintaining C-spine precautions with Willene Hatchet RN at head and this Probation officer and [place] ED tech doing patient care. Patient tolerated procedure well.

## 2019-06-21 NOTE — ED Notes (Signed)
Permission for transfer received from son, Linna Hoff. Patient was able to speak with Linna Hoff her son on the phone.

## 2019-06-21 NOTE — ED Notes (Signed)
C-collar applied to patient in CT scan per Dr. Charna Archer.

## 2019-06-21 NOTE — ED Provider Notes (Signed)
[facility] Emergency Department Provider Note   ____________________________________________   First MD Initiated Contact with Patient 06/21/19 1045     (approximate)  I have reviewed the triage vital signs and the nursing notes.   HISTORY  Chief Complaint Fall    HPI Cindy Graham is a 86 y.o. female with past medical history of hypertension, hyperlipidemia, COPD, and GERD who presents to the ED complaining of fall and hip pain.  EMS reports that just prior to arrival patient was attempting to transfer from her recliner to her wheelchair, however she did not lock the wheelchair and fell to the ground.  She reports hitting her head, but denies losing consciousness.  She primarily complains of pain to her right hip and knee, has been unable to bear weight or walk at all since the fall.  She does not take any blood thinners, states she last had something to eat with breakfast just before the fall.        Past Medical History:  Diagnosis Date  . Anxiety   . Arthritis    osteo  . Asthma   . COPD (chronic obstructive pulmonary disease) ([place])   . GERD (gastroesophageal reflux disease)   . Heart murmur   . Hernia   . Hyperlipidemia   . Hypertension   . Melanoma ([place])    face  . Syncope and collapse   . Urine incontinence     Patient Active Problem List   Diagnosis Date Noted  . Acute kidney injury (AKI) with acute tubular necrosis (ATN) ([place]) 02/27/2019  . Stroke (cerebrum) ([place]) 02/22/2019  . Cellulitis 06/12/2018  . Hyponatremia 11/24/2017  . Headache 11/24/2017  . Wound of left leg 11/01/2017  . Constipation 09/05/2017  . Other fatigue 12/22/2016  . Tremor 03/29/2016  . Amaurosis fugax 12/25/2015  . Asthma 09/17/2015  . PVD (peripheral vascular disease) ([place]) 05/28/2015  . B12 deficiency 08/29/2014  . Chronic back pain greater than 3 months duration 04/23/2013  . Chronic kidney disease (CKD), stage III (moderate) 04/12/2012  .  Hyperlipidemia LDL goal < 100 03/05/2012  . UTI (urinary tract infection) 07/26/2011  . Osteoarthritis 07/06/2011  . Hypertension 02/21/2011  . Depression/Anxiety 02/21/2011    Past Surgical History:  Procedure Laterality Date  . ABDOMINAL HYSTERECTOMY    . AMPUTATION TOE Right 06/14/2018   Procedure: AMPUTATION TOE - RIGHT 5TH;  Surgeon: Albertine Patricia, DPM;  Location: [facility] ORS;  Service: Podiatry;  Laterality: Right;  . CHOLECYSTECTOMY    . HERNIA REPAIR      Prior to Admission medications   Medication Sig Start Date End Date Taking? Authorizing Provider  acetaminophen (TYLENOL) 325 MG tablet Take 650 mg by mouth 2 (two) times daily.   Yes [provider]  acetaminophen (TYLENOL) 500 MG tablet Take 500 mg by mouth every 6 (six) hours as needed for mild pain or moderate pain.    Yes [provider]  alendronate (FOSAMAX) 70 MG tablet Take 70 mg by mouth every Thursday.    Yes [provider]  alum & mag hydroxide-simeth (MINTOX) 200-200-20 MG/5ML suspension Take 30 mLs by mouth 3 (three) times daily as needed for indigestion or heartburn.    Yes [provider]  ascorbic acid (VITAMIN C) 250 MG tablet Take 500 mg by mouth 2 (two) times daily.   Yes [provider]  aspirin 325 MG tablet Take 325 mg by mouth at bedtime.   Yes [provider]  Biotin 1000  MCG tablet Take 1,000 mcg by mouth daily.     Yes [provider]  enalapril (VASOTEC) 20 MG tablet TAKE ONE TABLET BY MOUTH EVERY DAY Patient taking differently: Take 20 mg by mouth daily.  09/15/17  Yes Leone Haven, MD  Flaxseed, Linseed, (FLAX SEED OIL) 1000 MG CAPS Take 1,000 mg by mouth daily.    Yes [provider]  fluticasone (FLOVENT HFA) 220 MCG/ACT inhaler Inhale 2 puffs into the lungs 2 (two) times daily. 08/12/17  Yes Leone Haven, MD  gabapentin (NEURONTIN) 300 MG capsule Take 1 capsule (300 mg total) by mouth at bedtime. Patient taking  differently: Take 300 mg by mouth daily.  12/06/17  Yes Leone Haven, MD  lactulose (CHRONULAC) 10 GM/15ML solution Take 30 mLs (20 g total) by mouth daily as needed for mild constipation. 12/06/17  Yes Leone Haven, MD  loperamide (IMODIUM) 2 MG capsule Take 2 mg by mouth 4 (four) times daily as needed for diarrhea or loose stools.    Yes [provider]  magnesium hydroxide (MILK OF MAGNESIA) 400 MG/5ML suspension Take 30 mLs by mouth daily as needed for mild constipation.   Yes [provider]  Multiple Vitamin (DAILY VITES) tablet Take 1 tablet by mouth daily.   Yes [provider]  nitroGLYCERIN (NITROSTAT) 0.4 MG SL tablet Place 0.4 mg under the tongue every 5 (five) minutes as needed for chest pain.   Yes [provider]  nystatin (MYCOSTATIN/NYSTOP) powder Apply 1 application topically 2 (two) times daily.   Yes [provider]  nystatin cream (MYCOSTATIN) Apply 1 application topically as needed (panniculitis). Apply to pannus   Yes [provider]  omeprazole (PRILOSEC) 20 MG capsule TAKE 1 CAPSULE BY MOUTH EVERY DAY. Patient taking differently: Take 20 mg by mouth daily.  11/15/17  Yes Leone Haven, MD  oxybutynin (DITROPAN) 5 MG tablet TAKE ONE TABLET BY MOUTH EVERY DAY Patient taking differently: Take 5 mg by mouth daily.  09/15/17  Yes Leone Haven, MD  tapentadol (NUCYNTA) 50 MG tablet Take 50 mg by mouth 3 (three) times daily.   Yes [provider]  guaifenesin (ROBITUSSIN) 100 MG/5ML syrup Take 200 mg by mouth every 6 (six) hours as needed for cough.     [provider]  Lidocaine 4 % PTCH Apply 1 patch topically daily.    [provider]  meloxicam (MOBIC) 15 MG tablet Take 15 mg by mouth daily.    [provider]  neomycin-bacitracin-polymyxin (NEOSPORIN) ointment Apply 1 application topically daily. Apply to skin tear on right calf    [provider]  nystatin  (MYCOSTATIN) 100000 UNIT/ML suspension Take 4 mLs by mouth 4 (four) times daily. 06/20/19 06/26/19  [provider]  propranolol (INDERAL) 60 MG tablet Take 60 mg by mouth daily.    [provider]  sertraline (ZOLOFT) 100 MG tablet TAKE ONE TABLET BY MOUTH EVERY DAY Patient taking differently: Take 100 mg by mouth daily.  11/15/17   Leone Haven, MD  triamcinolone cream (KENALOG) 0.1 % Apply 1 application topically 2 (two) times daily.    [provider]  trolamine salicylate (ASPERCREME) 10 % cream Apply 1 application topically 3 (three) times daily as needed for muscle pain.     [provider]  verapamil (VERELAN PM) 240 MG 24 hr capsule Take 240 mg by mouth at bedtime.    [provider]    Allergies Statins, Tramadol,  Levaquin [levofloxacin in d5w], Neomycin-polymyxin-gramicidin, Penicillins, Sulfa antibiotics, and Tetracyclines & related  Family History  Problem Relation Age of Onset  . Stroke Father   . Heart disease Sister   . Cancer Brother        colon  . Heart disease Brother   . Cancer Sister        colon  . Heart attack Brother   . Heart attack Son   . Parkinson's disease Son     Social History Social History   Tobacco Use  . Smoking status: Never Smoker  . Smokeless tobacco: Never Used  Substance Use Topics  . Alcohol use: No  . Drug use: No    Review of Systems  Constitutional: No fever/chills Eyes: No visual changes. ENT: No sore throat. Cardiovascular: Denies chest pain. Respiratory: Denies shortness of breath. Gastrointestinal: No abdominal pain.  No nausea, no vomiting.  No diarrhea.  No constipation. Genitourinary: Negative for dysuria. Musculoskeletal: Negative for back pain.  Positive for hip pain. Skin: Negative for rash. Neurological: Negative for headaches, focal weakness or numbness.  ____________________________________________   PHYSICAL EXAM:  VITAL SIGNS: ED Triage Vitals  Enc Vitals  Group     BP      Pulse      Resp      Temp      Temp src      SpO2      Weight      Height      Head Circumference      Peak Flow      Pain Score      Pain Loc      Pain Edu?      Excl. in [place]?     Constitutional: Alert and oriented. Eyes: Conjunctivae are normal. Head: Atraumatic. Nose: No congestion/rhinnorhea. Mouth/Throat: Mucous membranes are moist. Neck: Normal ROM, no midline cervical spine tenderness. Cardiovascular: Normal rate, regular rhythm. Grossly normal heart sounds. Respiratory: Normal respiratory effort.  No retractions. Lungs CTAB. Gastrointestinal: Soft and nontender. No distention. Genitourinary: deferred Musculoskeletal: No lower extremity tenderness nor edema.  Diffuse tenderness to right hip and right knee with shortened and externally rotated right lower extremity.  2+ DP pulses bilaterally. Neurologic:  Normal speech and language. No gross focal neurologic deficits are appreciated. Skin:  Skin is warm, dry and intact. No rash noted. Psychiatric: Mood and affect are normal. Speech and behavior are normal.  ____________________________________________   LABS (all labs ordered are listed, but only abnormal results are displayed)  Labs Reviewed  BASIC METABOLIC PANEL - Abnormal; Notable for the following components:      Result Value   Glucose, Bld 109 (*)    GFR calc non Af Amer 50 (*)    GFR calc Af Amer 58 (*)    All other components within normal limits  RESPIRATORY PANEL BY RT PCR (FLU A&B, COVID)  CBC WITH DIFFERENTIAL/PLATELET   ____________________________________________  EKG  ED ECG REPORT I, Blake Divine, the attending physician, personally viewed and interpreted this ECG.   Date: 06/21/2019  EKG Time: 10:46  Rate: 90  Rhythm: normal sinus rhythm  Axis: Normal  Intervals:none  ST&T Change: None    PROCEDURES  Procedure(s) performed (including Critical Care):  .Critical Care Performed by: Blake Divine,  MD Authorized by: Blake Divine, MD   Critical care provider statement:    Critical care time (minutes):  45   Critical care time was exclusive of:  Separately billable procedures and treating other patients and teaching  time   Critical care was necessary to treat or prevent imminent or life-threatening deterioration of the following conditions:  Trauma   Critical care was time spent personally by me on the following activities:  Discussions with consultants, evaluation of patient's response to treatment, examination of patient, ordering and performing treatments and interventions, ordering and review of laboratory studies, ordering and review of radiographic studies, pulse oximetry, re-evaluation of patient's condition, obtaining history from patient or surrogate and review of old charts   I assumed direction of critical care for this patient from another provider in my specialty: no       ____________________________________________   INITIAL IMPRESSION / ASSESSMENT AND PLAN / ED COURSE       86 year old female presents to the ED following mechanical fall just prior to arrival where she slipped attempting to get into her unlocked wheelchair.  She struck her head as well as her right hip, now complains of significant pain to her right hip with obvious deformity.  She appears neurovascularly intact to her bilateral lower extremities, will obtain imaging of right hip and knee.  We will also image her head and neck.  Suspect she will require admission for hip fracture.  CT head negative for acute process, however CT of C-spine shows acute C1 and C2 fractures.  Patient subsequently placed in cervical collar and remains neurologically intact on reassessment.  X-rays of right hip show intertrochanteric fracture, x-rays of the knee are negative.  Case was discussed with neurosurgery PA Lyndle Herrlich at [facility], who states that if orthopedics would plan to operate on this patient then they will happily  evaluate and determine need for operative intervention.  She will also likely require MRI to assess for ligamentous injury of her cervical spine.  Case discussed with Dr. Erlinda Hong of orthopedics at [facility], who states patient is a candidate for operative intervention of her hip given she will ambulate with a walker at times.  He will tentatively plan for operative intervention tomorrow.  Case discussed with hospitalist at [facility], Dr. Roosevelt Locks, who accepts patient in transfer.  Patient given additional dose of morphine for pain control, remains hemodynamically and neurologically stable at this time.  Patient's son updated on plan for transfer to [facility].      ____________________________________________   FINAL CLINICAL IMPRESSION(S) / ED DIAGNOSES  Final diagnoses:  Closed fracture of right hip, initial encounter [facility])  Closed displaced fracture of first cervical vertebra, unspecified fracture morphology, initial encounter [facility])  Fall, initial encounter     ED Discharge Orders    None       Note:  This document was prepared using Dragon voice recognition software and may include unintentional dictation errors.   Blake Divine, MD 06/21/19 1510

## 2019-06-21 NOTE — Progress Notes (Addendum)
  NEUROSURGERY PROGRESS NOTE   Received call from Dr Charna Archer, [place] at [facility], regarding patient.  86 year old female who suffered a mechanical fall transferring from chair to wheelchair. Did strike head, No LOC. Not on blood thinning medications.  Was found have C1 and C2 fractures as well as right hip fracture. NSY called for recommendations. She is neurologically intact.  CT C spine reviewed. There are acute fractures of posterior arch of C1 and right C1 and C2 TPs that extend to transverse foramina. There is also distraction of right C1-2 articulation. - CTA neck to r/o vert injury (already ordered by EDP)  At baseline, patient ambulates with cane and uses wheelchair occasionally. Dr Erlinda Hong with Ortho has been consulted and recs surgery for hip fracture. Patient to be transferred to [facility] for further orthopedic intervention and NS consultation.  Will formally consult when patient arrives to [facility].  Ferne Reus, PA-C [facility] [employer]

## 2019-06-21 NOTE — Progress Notes (Signed)
RN spoke with son, Linna Hoff, and updated him on pt.

## 2019-06-21 NOTE — Consult Note (Signed)
Chief Complaint   No chief complaint on file.   HPI   Consult requested by: Dr Charna Archer, [place] at [facility] Reason for consult: C1, C2 fracture  HPI: Cindy Graham is a 86 y.o. female with multiple medical comorbidities who presented to [facility] ED after a mechanical fall. She was transferring from chair to wheelchair when she fell. Did strike head, No LOC. Primary complaint was right hip pain and inability to ambulate due to pain. She underwent work up by EDP which included CT head and C spine.She was found have C1 and C2 fractures. Also noted to have right hip fracture. NSY called for recommendations regarding c spine fractures.   Her primary issue is right hip pain. She denies any neck pain, N/T/W in extremities with exception of in her right hip secondary to fracture. She tells me she is not interested in any type of surgery given her advanced age.   Patient Active Problem List   Diagnosis Date Noted  . Hip fracture, unspecified laterality, closed, initial encounter ([place]) 06/21/2019  . Acute kidney injury (AKI) with acute tubular necrosis (ATN) ([place]) 02/27/2019  . Stroke (cerebrum) ([place]) 02/22/2019  . Cellulitis 06/12/2018  . Hyponatremia 11/24/2017  . Headache 11/24/2017  . Wound of left leg 11/01/2017  . Constipation 09/05/2017  . Other fatigue 12/22/2016  . Tremor 03/29/2016  . Amaurosis fugax 12/25/2015  . Asthma 09/17/2015  . PVD (peripheral vascular disease) ([place]) 05/28/2015  . B12 deficiency 08/29/2014  . Chronic back pain greater than 3 months duration 04/23/2013  . Chronic kidney disease (CKD), stage III (moderate) 04/12/2012  . Hyperlipidemia LDL goal < 100 03/05/2012  . UTI (urinary tract infection) 07/26/2011  . Osteoarthritis 07/06/2011  . Hypertension 02/21/2011  . Depression/Anxiety 02/21/2011    PMH: Past Medical History:  Diagnosis Date  . Anxiety   . Arthritis    osteo  . Asthma   . COPD (chronic obstructive pulmonary disease) ([place])   . GERD  (gastroesophageal reflux disease)   . Heart murmur   . Hernia   . Hyperlipidemia   . Hypertension   . Melanoma ([place])    face  . Syncope and collapse   . Urine incontinence     PSH: Past Surgical History:  Procedure Laterality Date  . ABDOMINAL HYSTERECTOMY    . AMPUTATION TOE Right 06/14/2018   Procedure: AMPUTATION TOE - RIGHT 5TH;  Surgeon: Albertine Patricia, DPM;  Location: [facility] ORS;  Service: Podiatry;  Laterality: Right;  . CHOLECYSTECTOMY    . HERNIA REPAIR      Medications Prior to Admission  Medication Sig Dispense Refill Last Dose  . acetaminophen (TYLENOL) 325 MG tablet Take 650 mg by mouth 2 (two) times daily.     Marland Kitchen acetaminophen (TYLENOL) 500 MG tablet Take 500 mg by mouth every 6 (six) hours as needed for mild pain or moderate pain.      Marland Kitchen alendronate (FOSAMAX) 70 MG tablet Take 70 mg by mouth every Thursday.      Marland Kitchen alum & mag hydroxide-simeth (MINTOX) [id number] MG/5ML suspension Take 30 mLs by mouth 3 (three) times daily as needed for indigestion or heartburn.      Marland Kitchen ascorbic acid (VITAMIN C) 250 MG tablet Take 500 mg by mouth 2 (two) times daily.     Marland Kitchen aspirin 325 MG tablet Take 325 mg by mouth at bedtime.     . Biotin 1000 MCG tablet Take 1,000 mcg by mouth daily.       Marland Kitchen  enalapril (VASOTEC) 20 MG tablet TAKE ONE TABLET BY MOUTH EVERY DAY (Patient taking differently: Take 20 mg by mouth daily. ) 90 tablet 1   . Flaxseed, Linseed, (FLAX SEED OIL) 1000 MG CAPS Take 1,000 mg by mouth daily.      . fluticasone (FLOVENT HFA) 220 MCG/ACT inhaler Inhale 2 puffs into the lungs 2 (two) times daily. 1 Inhaler 12   . gabapentin (NEURONTIN) 300 MG capsule Take 1 capsule (300 mg total) by mouth at bedtime. (Patient taking differently: Take 300 mg by mouth daily. ) 90 capsule 1   . guaifenesin (ROBITUSSIN) 100 MG/5ML syrup Take 200 mg by mouth every 6 (six) hours as needed for cough.      . lactulose (CHRONULAC) 10 GM/15ML solution Take 30 mLs (20 g total) by mouth daily as needed  for mild constipation. 240 mL 0   . loperamide (IMODIUM) 2 MG capsule Take 2 mg by mouth 4 (four) times daily as needed for diarrhea or loose stools.      . magnesium hydroxide (MILK OF MAGNESIA) 400 MG/5ML suspension Take 30 mLs by mouth daily as needed for mild constipation.     . Multiple Vitamin (DAILY VITES) tablet Take 1 tablet by mouth daily.     . nitroGLYCERIN (NITROSTAT) 0.4 MG SL tablet Place 0.4 mg under the tongue every 5 (five) minutes as needed for chest pain.     Marland Kitchen nystatin (MYCOSTATIN) 100000 UNIT/ML suspension Take 4 mLs by mouth 4 (four) times daily.     Marland Kitchen nystatin (MYCOSTATIN/NYSTOP) powder Apply 1 application topically 2 (two) times daily.     Marland Kitchen nystatin cream (MYCOSTATIN) Apply 1 application topically as needed (panniculitis). Apply to pannus     . omeprazole (PRILOSEC) 20 MG capsule TAKE 1 CAPSULE BY MOUTH EVERY DAY. (Patient taking differently: Take 20 mg by mouth daily. ) 90 capsule 1   . oxybutynin (DITROPAN) 5 MG tablet TAKE ONE TABLET BY MOUTH EVERY DAY (Patient taking differently: Take 5 mg by mouth daily. ) 90 tablet 1   . propranolol (INDERAL) 60 MG tablet Take 60 mg by mouth daily.     . sertraline (ZOLOFT) 100 MG tablet TAKE ONE TABLET BY MOUTH EVERY DAY (Patient taking differently: Take 100 mg by mouth daily. ) 90 tablet 1   . tapentadol (NUCYNTA) 50 MG tablet Take 50 mg by mouth 3 (three) times daily.     . verapamil (VERELAN PM) 240 MG 24 hr capsule Take 240 mg by mouth at bedtime.     . Zinc Chelated 50 MG TABS Take 50 mg by mouth daily.       SH: Social History   Tobacco Use  . Smoking status: Never Smoker  . Smokeless tobacco: Never Used  Substance Use Topics  . Alcohol use: No  . Drug use: No    MEDS: Prior to Admission medications   Medication Sig Start Date End Date Taking? Authorizing Provider  acetaminophen (TYLENOL) 325 MG tablet Take 650 mg by mouth 2 (two) times daily.    [provider]  acetaminophen (TYLENOL) 500 MG tablet  Take 500 mg by mouth every 6 (six) hours as needed for mild pain or moderate pain.     [provider]  alendronate (FOSAMAX) 70 MG tablet Take 70 mg by mouth every Thursday.     [provider]  alum & mag hydroxide-simeth (MINTOX) 200-200-20 MG/5ML suspension Take 30 mLs by mouth 3 (three) times daily as needed for indigestion or  heartburn.     [provider]  ascorbic acid (VITAMIN C) 250 MG tablet Take 500 mg by mouth 2 (two) times daily.    [provider]  aspirin 325 MG tablet Take 325 mg by mouth at bedtime.    [provider]  Biotin 1000 MCG tablet Take 1,000 mcg by mouth daily.      [provider]  enalapril (VASOTEC) 20 MG tablet TAKE ONE TABLET BY MOUTH EVERY DAY Patient taking differently: Take 20 mg by mouth daily.  09/15/17   Leone Haven, MD  Flaxseed, Linseed, (FLAX SEED OIL) 1000 MG CAPS Take 1,000 mg by mouth daily.     [provider]  fluticasone (FLOVENT HFA) 220 MCG/ACT inhaler Inhale 2 puffs into the lungs 2 (two) times daily. 08/12/17   Leone Haven, MD  gabapentin (NEURONTIN) 300 MG capsule Take 1 capsule (300 mg total) by mouth at bedtime. Patient taking differently: Take 300 mg by mouth daily.  12/06/17   Leone Haven, MD  guaifenesin (ROBITUSSIN) 100 MG/5ML syrup Take 200 mg by mouth every 6 (six) hours as needed for cough.     [provider]  lactulose (CHRONULAC) 10 GM/15ML solution Take 30 mLs (20 g total) by mouth daily as needed for mild constipation. 12/06/17   Leone Haven, MD  loperamide (IMODIUM) 2 MG capsule Take 2 mg by mouth 4 (four) times daily as needed for diarrhea or loose stools.     [provider]  magnesium hydroxide (MILK OF MAGNESIA) 400 MG/5ML suspension Take 30 mLs by mouth daily as needed for mild constipation.    [provider]  Multiple Vitamin (DAILY VITES) tablet Take 1 tablet by mouth daily.    [provider]    nitroGLYCERIN (NITROSTAT) 0.4 MG SL tablet Place 0.4 mg under the tongue every 5 (five) minutes as needed for chest pain.    [provider]  nystatin (MYCOSTATIN) 100000 UNIT/ML suspension Take 4 mLs by mouth 4 (four) times daily. 06/20/19 06/26/19  [provider]  nystatin (MYCOSTATIN/NYSTOP) powder Apply 1 application topically 2 (two) times daily.    [provider]  nystatin cream (MYCOSTATIN) Apply 1 application topically as needed (panniculitis). Apply to pannus    [provider]  omeprazole (PRILOSEC) 20 MG capsule TAKE 1 CAPSULE BY MOUTH EVERY DAY. Patient taking differently: Take 20 mg by mouth daily.  11/15/17   Leone Haven, MD  oxybutynin (DITROPAN) 5 MG tablet TAKE ONE TABLET BY MOUTH EVERY DAY Patient taking differently: Take 5 mg by mouth daily.  09/15/17   Leone Haven, MD  propranolol (INDERAL) 60 MG tablet Take 60 mg by mouth daily.    [provider]  sertraline (ZOLOFT) 100 MG tablet TAKE ONE TABLET BY MOUTH EVERY DAY Patient taking differently: Take 100 mg by mouth daily.  11/15/17   Leone Haven, MD  tapentadol (NUCYNTA) 50 MG tablet Take 50 mg by mouth 3 (three) times daily.    [provider]  verapamil (VERELAN PM) 240 MG 24 hr capsule Take 240 mg by mouth at bedtime.    [provider]  Zinc Chelated 50 MG TABS Take 50 mg by mouth daily.    [provider]    ALLERGY: Allergies  Allergen Reactions  . Statins Other (See Comments)    Very bad muscle aches  . Tramadol Itching  . Levaquin [Levofloxacin In D5w] Diarrhea and Other (See Comments)  Confusion, hallucinations  . Neomycin-Polymyxin-Gramicidin Other (See Comments)  . Penicillins     Did it involve swelling of the face/tongue/throat, SOB, or low BP? Unknown Did it involve sudden or severe rash/hives, skin peeling, or any reaction on the inside of your mouth or nose? Unknown Did you need to seek medical attention at a  hospital or doctor's office? Unknown When did it last happen?Unknown If all above answers are "NO", may proceed with cephalosporin use.  . Sulfa Antibiotics   . Tetracyclines & Related     Social History   Tobacco Use  . Smoking status: Never Smoker  . Smokeless tobacco: Never Used  Substance Use Topics  . Alcohol use: No     Family History  Problem Relation Age of Onset  . Stroke Father   . Heart disease Sister   . Cancer Brother        colon  . Heart disease Brother   . Cancer Sister        colon  . Heart attack Brother   . Heart attack Son   . Parkinson's disease Son      ROS   Review of Systems  Constitutional: Negative.   HENT: Negative.   Eyes: Negative.   Respiratory: Negative.   Cardiovascular: Negative.   Gastrointestinal: Negative.   Genitourinary: Negative.   Musculoskeletal: Positive for falls and myalgias. Negative for neck pain.  Skin: Negative.   Neurological: Positive for focal weakness (right hip (fracture)). Negative for dizziness, tingling, tremors, sensory change, speech change, seizures, loss of consciousness, weakness and headaches.    Exam   Vitals:   06/21/19 1727  BP: (!) 196/70  Pulse: 90  Temp: 98.4 F (36.9 C)  SpO2: 96%   General appearance: elderly female in c collar, hard of hearing Eyes: No scleral injection Cardiovascular: Regular rate and rhythm without murmurs, rubs, gallops. No edema or variciosities. Distal pulses normal. Pulmonary: Effort normal, non-labored breathing Musculoskeletal:     Muscle tone upper extremities: Normal    Muscle tone lower extremities: Normal    Motor exam: Upper Extremities Deltoid Bicep Tricep Grip  Right 5/5 5/5 5/5 5/5  Left 5/5 5/5 5/5 5/5   Lower Extremity IP Quad PF DF EHL  Right Did not examine (fx) Did not examine (fx) 5/5 5/5 5/5  Left 5/5 5/5 5/5 5/5 5/5   Neurological Mental Status:    - Patient is awake, alert, oriented to person, place, month, year, and situation    -  Patient is able to give a clear and coherent history.    - No signs of aphasia or neglect Cranial Nerves    - II: Visual Fields are full. PERRL    - III/IV/VI: EOMI without ptosis or diploplia.     - V: Facial sensation is grossly normal    - VII: Facial movement is symmetric.     - VIII: hearing is intact to voice    - X: Uvula elevates symmetrically    - XI: Shoulder shrug is symmetric.    - XII: tongue is midline without atrophy or fasciculations.  Sensory: Sensation grossly intact to LT  Results - Imaging/Labs   Results for orders placed or performed during the hospital encounter of 06/21/19 (from the past 48 hour(s))  Basic metabolic panel     Status: Abnormal   Collection Time: 06/21/19 10:59 AM  Result Value Ref Range   Sodium 139 135 - 145 mmol/L   Potassium 4.1 3.5 - 5.1 mmol/L  Chloride 100 98 - 111 mmol/L   CO2 29 22 - 32 mmol/L   Glucose, Bld 109 (H) 70 - 99 mg/dL   BUN 23 8 - 23 mg/dL   Creatinine, Ser 0.96 0.44 - 1.00 mg/dL   Calcium 9.5 8.9 - 10.3 mg/dL   GFR calc non Af Amer 50 (L) >60 mL/min   GFR calc Af Amer 58 (L) >60 mL/min   Anion gap 10 5 - 15    Comment: Performed at [facility], [address] [place], [place] [address]  CBC with Differential     Status: None   Collection Time: 06/21/19 10:59 AM  Result Value Ref Range   WBC 8.1 4.0 - 10.5 K/uL   RBC 4.45 3.87 - 5.11 MIL/uL   Hemoglobin 13.5 12.0 - 15.0 g/dL   HCT 42.6 36.0 - 46.0 %   MCV 95.7 80.0 - 100.0 fL   MCH 30.3 26.0 - 34.0 pg   MCHC 31.7 30.0 - 36.0 g/dL   RDW 14.3 11.5 - 15.5 %   Platelets 262 150 - 400 K/uL   nRBC 0.0 0.0 - 0.2 %   Neutrophils Relative % 50 %   Neutro Abs 4.1 1.7 - 7.7 K/uL   Lymphocytes Relative 34 %   Lymphs Abs 2.8 0.7 - 4.0 K/uL   Monocytes Relative 10 %   Monocytes Absolute 0.8 0.1 - 1.0 K/uL   Eosinophils Relative 4 %   Eosinophils Absolute 0.3 0.0 - 0.5 K/uL   Basophils Relative 1 %   Basophils Absolute 0.1 0.0 - 0.1 K/uL   Immature  Granulocytes 1 %   Abs Immature Granulocytes 0.07 0.00 - 0.07 K/uL    Comment: Performed at [facility], [address], [place], [place] [address]  Respiratory Panel by RT PCR (Flu A&B, Covid) - Nasopharyngeal Swab     Status: None   Collection Time: 06/21/19  1:58 PM   Specimen: Nasopharyngeal Swab  Result Value Ref Range   SARS Coronavirus 2 by RT PCR NEGATIVE NEGATIVE    Comment: (NOTE) SARS-CoV-2 target nucleic acids are NOT DETECTED. The SARS-CoV-2 RNA is generally detectable in upper respiratoy specimens during the acute phase of infection. The lowest concentration of SARS-CoV-2 viral copies this assay can detect is 131 copies/mL. A negative result does not preclude SARS-Cov-2 infection and should not be used as the sole basis for treatment or other patient management decisions. A negative result may occur with  improper specimen collection/handling, submission of specimen other than nasopharyngeal swab, presence of viral mutation(s) within the areas targeted by this assay, and inadequate number of viral copies (<131 copies/mL). A negative result must be combined with clinical observations, patient history, and epidemiological information. The expected result is Negative. Fact Sheet for Patients:  PinkCheek.be Fact Sheet for Healthcare Providers:  GravelBags.it This test is not yet ap proved or cleared by the [place] FDA and  has been authorized for detection and/or diagnosis of SARS-CoV-2 by FDA under an Emergency Use Authorization (EUA). This EUA will remain  in effect (meaning this test can be used) for the duration of the COVID-19 declaration under Section 564(b)(1) of the Act, 21 U.S.C. section 360bbb-3(b)(1), unless the authorization is terminated or revoked sooner.    Influenza A by PCR NEGATIVE NEGATIVE   Influenza B by PCR NEGATIVE NEGATIVE    Comment: (NOTE) The Xpert Xpress  SARS-CoV-2/FLU/RSV assay is intended as an aid in  the diagnosis of influenza from Nasopharyngeal swab specimens and  should not be used as a sole basis for treatment. Nasal washings and  aspirates are unacceptable for Xpert Xpress SARS-CoV-2/FLU/RSV  testing. Fact Sheet for Patients: PinkCheek.be Fact Sheet for Healthcare Providers: GravelBags.it This test is not yet approved or cleared by the [place] FDA and  has been authorized for detection and/or diagnosis of SARS-CoV-2 by  FDA under an Emergency Use Authorization (EUA). This EUA will remain  in effect (meaning this test can be used) for the duration of the  Covid-19 declaration under Section 564(b)(1) of the Act, 21  U.S.C. section 360bbb-3(b)(1), unless the authorization is  terminated or revoked. Performed at [facility], [address] [place], [place] [address]     DG Chest 1 View  Result Date: 06/21/2019 CLINICAL DATA:  Fall.  Right hip fracture. EXAM: CHEST  1 VIEW COMPARISON:  Chest x-ray dated February 27, 2019. FINDINGS: The heart size and mediastinal contours are within normal limits. Normal pulmonary vascularity. No focal consolidation, pleural effusion, or pneumothorax. Unchanged elevation of the right hemidiaphragm. No acute osseous abnormality. IMPRESSION: No active disease. Electronically Signed   By: Titus Dubin M.D.   On: 06/21/2019 12:54   DG Knee 2 Views Right  Result Date: 06/21/2019 CLINICAL DATA:  Right knee pain after fall. EXAM: RIGHT KNEE - 1-2 VIEW COMPARISON:  None. FINDINGS: No acute fracture or dislocation. No large joint effusion, although evaluation is limited due to suboptimal positioning on the cross-table lateral view. Moderate to severe medial compartment joint space narrowing. Medial and lateral compartment marginal osteophytes. Osteopenia. Vascular calcifications. IMPRESSION: 1.  No acute osseous abnormality. 2. Moderate  to severe medial compartment osteoarthritis. Electronically Signed   By: Titus Dubin M.D.   On: 06/21/2019 12:52   CT Head Wo Contrast  Result Date: 06/21/2019 CLINICAL DATA:  Head trauma EXAM: CT HEAD WITHOUT CONTRAST TECHNIQUE: Contiguous axial images were obtained from the base of the skull through the vertex without intravenous contrast. COMPARISON:  04/14/2019 FINDINGS: Brain: There is no acute intracranial hemorrhage, mass-effect, or edema. Gray-white differentiation is preserved. There is no extra-axial fluid collection. Stable left frontal convexity calcified meningioma. Ventricles and sulci are stable in size and configuration. Patchy hypoattenuation in the supratentorial white matter is nonspecific but likely reflects stable chronic microvascular ischemic changes. Vascular: There is atherosclerotic calcification at the skull base. Skull: Right frontal calvarium osteoma.  Otherwise unremarkable. Sinuses/Orbits: No acute finding. Other: None. IMPRESSION: No evidence of acute intracranial injury. Stable chronic findings detailed above. Electronically Signed   By: Macy Mis M.D.   On: 06/21/2019 12:24   CT Angio Neck W and/or Wo Contrast  Result Date: 06/21/2019 CLINICAL DATA:  Cervical spine fracture EXAM: CT ANGIOGRAPHY NECK TECHNIQUE: Multidetector CT imaging of the neck was performed using the standard protocol during bolus administration of intravenous contrast. Multiplanar CT image reconstructions and MIPs were obtained to evaluate the vascular anatomy. Carotid stenosis measurements (when applicable) are obtained utilizing NASCET criteria, using the distal internal carotid diameter as the denominator. CONTRAST:  81mL OMNIPAQUE IOHEXOL 350 MG/ML SOLN COMPARISON:  None. FINDINGS: Aortic arch: Calcified plaque along the arch and great vessel origins, which are patent. Right carotid system: Patent. No measurable stenosis at the ICA origin. Left carotid system: Patent. Mild calcified plaque  at the bifurcation. No measurable stenosis of the ICA origin. Vertebral arteries: Extracranial vertebral arteries are patent. There is mild luminal irregularity of the right vertebral artery at the C2 level in proximity to fractures. Suspected sub 2 mm superiorly  directed pseudoaneurysm (best seen on series 7, image 123). Skeleton: Cervical spine fractures as previously described. Other neck: No mass or adenopathy. Upper chest: Included upper lungs are clear. IMPRESSION: Suspected focal right vertebral traumatic injury at the C2 level with tiny pseudoaneurysm. Electronically Signed   By: Macy Mis M.D.   On: 06/21/2019 15:28   CT Cervical Spine Wo Contrast  Result Date: 06/21/2019 CLINICAL DATA:  Fall EXAM: CT CERVICAL SPINE WITHOUT CONTRAST TECHNIQUE: Multidetector CT imaging of the cervical spine was performed without intravenous contrast. Multiplanar CT image reconstructions were also generated. COMPARISON:  2019 FINDINGS: Alignment: Stable. Skull base and vertebrae: Acute nondisplaced fracture through the posterior arch of C1 on the left. There is distraction at the right lateral mass C1-C2 articulation. Acute fractures are present through the right posterior arch of C1 as well as the right C1 transverse foramen. Comminuted acute fractures through the right transverse process and transverse foramen of C2. Vertebral body heights are preserved. Soft tissues and spinal canal: Stable retrodental likely degenerative soft tissue. Disc levels: Insert degenerative. There is no high-grade osseous encroachment on the spinal canal. Upper chest: No acute abnormality. Other: None. IMPRESSION: Acute fractures of the posterior arch of C1 and right C1 and C2 transverse processes and transverse foramina. There is distraction of the right C1-C2 articulation. Vascular imaging recommended to exclude vertebral artery injury. Electronically Signed   By: Macy Mis M.D.   On: 06/21/2019 12:40   DG Hip Unilat W or Wo  Pelvis 2-3 Views Right  Result Date: 06/21/2019 CLINICAL DATA:  Right hip pain after fall. EXAM: DG HIP (WITH OR WITHOUT PELVIS) 2-3V RIGHT COMPARISON:  November 03, 2017. FINDINGS: Acute angulated right intertrochanteric femur fracture. No dislocation. The pubic symphysis and sacroiliac joints are intact. Osteopenia. IMPRESSION: Acute angulated right intertrochanteric femur fracture. Electronically Signed   By: Titus Dubin M.D.   On: 06/21/2019 12:50   Impression/Plan   86 y.o. female with C1 and C2 fractures as well as a right hip fracture after a mechanical fall. Right LE exam limited due to fracture, but otherwise is grossly neurologically intact.  Acute fracture of posterior arch of C1, right C1 and C2 TPs that extend to transverse foramina. ?distraction of right C1-2 articulation noted, but may be chronic when comparing to MRI - No indication for NS intervention. In fact, she tells me she is not interested in spine surgery at all. - Tx conservatively.  - Aspen C collar at all times - F/U at discharge in 1-2 weeks for monitoring  Right Vert injury, ?pseudoaneurysm - CTA neck obtained due to extension into transverse foramina and shows focal right vert injury, ?psedoaneurysm. Will need to start ASA 325mg  daily. Held in prep for surgery tomorrow. Will need to start when cleared from Ortho surgery standpoint.  Right hip fx - Per Ortho.   Ferne Reus, PA-C [facility] [employer]

## 2019-06-21 NOTE — Anesthesia Preprocedure Evaluation (Addendum)
Anesthesia Evaluation  Patient identified by MRN, date of birth, ID band Patient awake    Reviewed: Allergy & Precautions, H&P , NPO status , Patient's Chart, lab work & pertinent test results, reviewed documented beta blocker date and time   Airway Mallampati: III  TM Distance: >3 FB Neck ROM: Limited    Dental no notable dental hx. (+) Edentulous Upper, Edentulous Lower, Dental Advisory Given   Pulmonary asthma , COPD,    Pulmonary exam normal breath sounds clear to auscultation       Cardiovascular hypertension, Pt. on medications and Pt. on home beta blockers  Rhythm:Regular Rate:Normal     Neuro/Psych  Headaches, Anxiety Depression    GI/Hepatic Neg liver ROS, GERD  Medicated,  Endo/Other  negative endocrine ROS  Renal/GU negative Renal ROS  negative genitourinary   Musculoskeletal  (+) Arthritis , Osteoarthritis,    Abdominal   Peds  Hematology negative hematology ROS (+)   Anesthesia Other Findings   Reproductive/Obstetrics negative OB ROS                            Anesthesia Physical Anesthesia Plan  ASA: II  Anesthesia Plan: Spinal and MAC   Post-op Pain Management:    Induction: Intravenous  PONV Risk Score and Plan: 3 and Propofol infusion, Ondansetron and Treatment may vary due to age or medical condition  Airway Management Planned: Simple Face Mask  Additional Equipment:   Intra-op Plan:   Post-operative Plan:   Informed Consent: I have reviewed the patients History and Physical, chart, labs and discussed the procedure including the risks, benefits and alternatives for the proposed anesthesia with the patient or authorized representative who has indicated his/her understanding and acceptance.   Patient has DNR.  Discussed DNR with patient and Suspend DNR.   Dental advisory given  Plan Discussed with: CRNA  Anesthesia Plan Comments:        Anesthesia  Quick Evaluation

## 2019-06-21 NOTE — Progress Notes (Signed)
Pt arrived to unit. Call bell within reach. [facility] paged for admit. C-collar in place.

## 2019-06-21 NOTE — ED Triage Notes (Signed)
Pt ems from [facility] s/p fall with right hip pain. Pt a/o.

## 2019-06-21 NOTE — Consult Note (Signed)
ORTHOPAEDIC CONSULTATION  REQUESTING PHYSICIAN: Lequita Halt, MD  Chief Complaint: Right intertrochanteric femur fracture  HPI: Cindy Graham is a 86 y.o. female who presents with right hip fracture s/p mechanical fall at assisted living facility.  [address] and hit her head but denies LOC.  Has C1-2 fx that will be treated nonop by NSU.  The patient endorses severe pain in the right hip that is worse with any movement.  Denies LOC/fever/chills/nausea/vomiting.  Walks with assistive devices at baseline (walker, cane, wheelchair).  Denies LOC and abd pain.  Past Medical History:  Diagnosis Date  . Anxiety   . Arthritis    osteo  . Asthma   . COPD (chronic obstructive pulmonary disease) ([place])   . GERD (gastroesophageal reflux disease)   . Heart murmur   . Hernia   . Hyperlipidemia   . Hypertension   . Melanoma ([place])    face  . Syncope and collapse   . Urine incontinence    Past Surgical History:  Procedure Laterality Date  . ABDOMINAL HYSTERECTOMY    . AMPUTATION TOE Right 06/14/2018   Procedure: AMPUTATION TOE - RIGHT 5TH;  Surgeon: Albertine Patricia, DPM;  Location: [facility] ORS;  Service: Podiatry;  Laterality: Right;  . CHOLECYSTECTOMY    . HERNIA REPAIR     Social History   Socioeconomic History  . Marital status: Widowed    Spouse name: Not on file  . Number of children: Not on file  . Years of education: Not on file  . Highest education level: Not on file  Occupational History  . Not on file  Tobacco Use  . Smoking status: Never Smoker  . Smokeless tobacco: Never Used  Substance and Sexual Activity  . Alcohol use: No  . Drug use: No  . Sexual activity: Not Currently  Other Topics Concern  . Not on file  Social History Narrative   Lives alone   Social Determinants of Health   Financial Resource Strain:   . Difficulty of Paying Living Expenses: Not on file  Food Insecurity:   . Worried About Charity fundraiser in the Last Year: Not on file  . Ran Out of  Food in the Last Year: Not on file  Transportation Needs:   . Lack of Transportation (Medical): Not on file  . Lack of Transportation (Non-Medical): Not on file  Physical Activity:   . Days of Exercise per Week: Not on file  . Minutes of Exercise per Session: Not on file  Stress:   . Feeling of Stress : Not on file  Social Connections:   . Frequency of Communication with Friends and Family: Not on file  . Frequency of Social Gatherings with Friends and Family: Not on file  . Attends Religious Services: Not on file  . Active Member of Clubs or Organizations: Not on file  . Attends Archivist Meetings: Not on file  . Marital Status: Not on file   Family History  Problem Relation Age of Onset  . Stroke Father   . Heart disease Sister   . Cancer Brother        colon  . Heart disease Brother   . Cancer Sister        colon  . Heart attack Brother   . Heart attack Son   . Parkinson's disease Son    Allergies  Allergen Reactions  . Statins Other (See Comments)    Very bad muscle aches  . Tramadol Itching  .  Levaquin [Levofloxacin In D5w] Diarrhea and Other (See Comments)    Confusion, hallucinations  . Neomycin-Polymyxin-Gramicidin Other (See Comments)  . Penicillins     Did it involve swelling of the face/tongue/throat, SOB, or low BP? Unknown Did it involve sudden or severe rash/hives, skin peeling, or any reaction on the inside of your mouth or nose? Unknown Did you need to seek medical attention at a hospital or doctor's office? Unknown When did it last happen?Unknown If all above answers are "NO", may proceed with cephalosporin use.  . Sulfa Antibiotics   . Tetracyclines & Related    Prior to Admission medications   Medication Sig Start Date End Date Taking? Authorizing Provider  acetaminophen (TYLENOL) 325 MG tablet Take 650 mg by mouth 2 (two) times daily.    [provider]  acetaminophen (TYLENOL) 500 MG tablet Take 500 mg by mouth every 6 (six)  hours as needed for mild pain or moderate pain.     [provider]  alendronate (FOSAMAX) 70 MG tablet Take 70 mg by mouth every Thursday.     [provider]  alum & mag hydroxide-simeth (MINTOX) 200-200-20 MG/5ML suspension Take 30 mLs by mouth 3 (three) times daily as needed for indigestion or heartburn.     [provider]  ascorbic acid (VITAMIN C) 250 MG tablet Take 500 mg by mouth 2 (two) times daily.    [provider]  aspirin 325 MG tablet Take 325 mg by mouth at bedtime.    [provider]  Biotin 1000 MCG tablet Take 1,000 mcg by mouth daily.      [provider]  enalapril (VASOTEC) 20 MG tablet TAKE ONE TABLET BY MOUTH EVERY DAY Patient taking differently: Take 20 mg by mouth daily.  09/15/17   Leone Haven, MD  Flaxseed, Linseed, (FLAX SEED OIL) 1000 MG CAPS Take 1,000 mg by mouth daily.     [provider]  fluticasone (FLOVENT HFA) 220 MCG/ACT inhaler Inhale 2 puffs into the lungs 2 (two) times daily. 08/12/17   Leone Haven, MD  gabapentin (NEURONTIN) 300 MG capsule Take 1 capsule (300 mg total) by mouth at bedtime. Patient taking differently: Take 300 mg by mouth daily.  12/06/17   Leone Haven, MD  guaifenesin (ROBITUSSIN) 100 MG/5ML syrup Take 200 mg by mouth every 6 (six) hours as needed for cough.     [provider]  lactulose (CHRONULAC) 10 GM/15ML solution Take 30 mLs (20 g total) by mouth daily as needed for mild constipation. 12/06/17   Leone Haven, MD  loperamide (IMODIUM) 2 MG capsule Take 2 mg by mouth 4 (four) times daily as needed for diarrhea or loose stools.     [provider]  magnesium hydroxide (MILK OF MAGNESIA) 400 MG/5ML suspension Take 30 mLs by mouth daily as needed for mild constipation.    [provider]  Multiple Vitamin (DAILY VITES) tablet Take 1 tablet by mouth daily.    [provider]  nitroGLYCERIN (NITROSTAT) 0.4 MG SL tablet  Place 0.4 mg under the tongue every 5 (five) minutes as needed for chest pain.    [provider]  nystatin (MYCOSTATIN) 100000 UNIT/ML suspension Take 4 mLs by mouth 4 (four) times daily. 06/20/19 06/26/19  [provider]  nystatin (MYCOSTATIN/NYSTOP) powder Apply 1 application topically 2 (two) times daily.    [provider]  nystatin cream (MYCOSTATIN) Apply 1 application topically as needed (panniculitis). Apply to pannus  [provider]  omeprazole (PRILOSEC) 20 MG capsule TAKE 1 CAPSULE BY MOUTH EVERY DAY. Patient taking differently: Take 20 mg by mouth daily.  11/15/17   Leone Haven, MD  oxybutynin (DITROPAN) 5 MG tablet TAKE ONE TABLET BY MOUTH EVERY DAY Patient taking differently: Take 5 mg by mouth daily.  09/15/17   Leone Haven, MD  propranolol (INDERAL) 60 MG tablet Take 60 mg by mouth daily.    [provider]  sertraline (ZOLOFT) 100 MG tablet TAKE ONE TABLET BY MOUTH EVERY DAY Patient taking differently: Take 100 mg by mouth daily.  11/15/17   Leone Haven, MD  tapentadol (NUCYNTA) 50 MG tablet Take 50 mg by mouth 3 (three) times daily.    [provider]  verapamil (VERELAN PM) 240 MG 24 hr capsule Take 240 mg by mouth at bedtime.    [provider]  Zinc Chelated 50 MG TABS Take 50 mg by mouth daily.    [provider]   DG Chest 1 View  Result Date: 06/21/2019 CLINICAL DATA:  Fall.  Right hip fracture. EXAM: CHEST  1 VIEW COMPARISON:  Chest x-ray dated February 27, 2019. FINDINGS: The heart size and mediastinal contours are within normal limits. Normal pulmonary vascularity. No focal consolidation, pleural effusion, or pneumothorax. Unchanged elevation of the right hemidiaphragm. No acute osseous abnormality. IMPRESSION: No active disease. Electronically Signed   By: Titus Dubin M.D.   On: 06/21/2019 12:54   DG Knee 2 Views Right  Result Date: 06/21/2019 CLINICAL DATA:  Right knee pain  after fall. EXAM: RIGHT KNEE - 1-2 VIEW COMPARISON:  None. FINDINGS: No acute fracture or dislocation. No large joint effusion, although evaluation is limited due to suboptimal positioning on the cross-table lateral view. Moderate to severe medial compartment joint space narrowing. Medial and lateral compartment marginal osteophytes. Osteopenia. Vascular calcifications. IMPRESSION: 1.  No acute osseous abnormality. 2. Moderate to severe medial compartment osteoarthritis. Electronically Signed   By: Titus Dubin M.D.   On: 06/21/2019 12:52   CT Head Wo Contrast  Result Date: 06/21/2019 CLINICAL DATA:  Head trauma EXAM: CT HEAD WITHOUT CONTRAST TECHNIQUE: Contiguous axial images were obtained from the base of the skull through the vertex without intravenous contrast. COMPARISON:  04/14/2019 FINDINGS: Brain: There is no acute intracranial hemorrhage, mass-effect, or edema. Gray-white differentiation is preserved. There is no extra-axial fluid collection. Stable left frontal convexity calcified meningioma. Ventricles and sulci are stable in size and configuration. Patchy hypoattenuation in the supratentorial white matter is nonspecific but likely reflects stable chronic microvascular ischemic changes. Vascular: There is atherosclerotic calcification at the skull base. Skull: Right frontal calvarium osteoma.  Otherwise unremarkable. Sinuses/Orbits: No acute finding. Other: None. IMPRESSION: No evidence of acute intracranial injury. Stable chronic findings detailed above. Electronically Signed   By: Macy Mis M.D.   On: 06/21/2019 12:24   CT Angio Neck W and/or Wo Contrast  Result Date: 06/21/2019 CLINICAL DATA:  Cervical spine fracture EXAM: CT ANGIOGRAPHY NECK TECHNIQUE: Multidetector CT imaging of the neck was performed using the standard protocol during bolus administration of intravenous contrast. Multiplanar CT image reconstructions and MIPs were obtained to evaluate the vascular anatomy. Carotid  stenosis measurements (when applicable) are obtained utilizing NASCET criteria, using the distal internal carotid diameter as the denominator. CONTRAST:  21mL OMNIPAQUE IOHEXOL 350 MG/ML SOLN COMPARISON:  None. FINDINGS: Aortic arch: Calcified plaque along the arch and great vessel origins, which are patent. Right carotid system: Patent. No measurable stenosis  at the ICA origin. Left carotid system: Patent. Mild calcified plaque at the bifurcation. No measurable stenosis of the ICA origin. Vertebral arteries: Extracranial vertebral arteries are patent. There is mild luminal irregularity of the right vertebral artery at the C2 level in proximity to fractures. Suspected sub 2 mm superiorly directed pseudoaneurysm (best seen on series 7, image 123). Skeleton: Cervical spine fractures as previously described. Other neck: No mass or adenopathy. Upper chest: Included upper lungs are clear. IMPRESSION: Suspected focal right vertebral traumatic injury at the C2 level with tiny pseudoaneurysm. Electronically Signed   By: Macy Mis M.D.   On: 06/21/2019 15:28   CT Cervical Spine Wo Contrast  Result Date: 06/21/2019 CLINICAL DATA:  Fall EXAM: CT CERVICAL SPINE WITHOUT CONTRAST TECHNIQUE: Multidetector CT imaging of the cervical spine was performed without intravenous contrast. Multiplanar CT image reconstructions were also generated. COMPARISON:  2019 FINDINGS: Alignment: Stable. Skull base and vertebrae: Acute nondisplaced fracture through the posterior arch of C1 on the left. There is distraction at the right lateral mass C1-C2 articulation. Acute fractures are present through the right posterior arch of C1 as well as the right C1 transverse foramen. Comminuted acute fractures through the right transverse process and transverse foramen of C2. Vertebral body heights are preserved. Soft tissues and spinal canal: Stable retrodental likely degenerative soft tissue. Disc levels: Insert degenerative. There is no  high-grade osseous encroachment on the spinal canal. Upper chest: No acute abnormality. Other: None. IMPRESSION: Acute fractures of the posterior arch of C1 and right C1 and C2 transverse processes and transverse foramina. There is distraction of the right C1-C2 articulation. Vascular imaging recommended to exclude vertebral artery injury. Electronically Signed   By: Macy Mis M.D.   On: 06/21/2019 12:40   DG Hip Unilat W or Wo Pelvis 2-3 Views Right  Result Date: 06/21/2019 CLINICAL DATA:  Right hip pain after fall. EXAM: DG HIP (WITH OR WITHOUT PELVIS) 2-3V RIGHT COMPARISON:  November 03, 2017. FINDINGS: Acute angulated right intertrochanteric femur fracture. No dislocation. The pubic symphysis and sacroiliac joints are intact. Osteopenia. IMPRESSION: Acute angulated right intertrochanteric femur fracture. Electronically Signed   By: Titus Dubin M.D.   On: 06/21/2019 12:50    All pertinent xrays, MRI, CT independently reviewed and interpreted  Positive ROS: All other systems have been reviewed and were otherwise negative with the exception of those mentioned in the HPI and as above.  Physical Exam: General: Alert, no acute distress Cardiovascular: No pedal edema Respiratory: No cyanosis, no use of accessory musculature GI: No organomegaly, abdomen is soft and non-tender Skin: No lesions in the area of chief complaint Neurologic: Sensation intact distally Psychiatric: Patient is competent for consent with normal mood and affect Lymphatic: No axillary or cervical lymphadenopathy  MUSCULOSKELETAL:  - severe pain with movement of the hip and extremity - skin intact - NVI distally - compartments soft  Assessment: Right intertrochanteric femur fracture  Plan: - surgical repair is recommended, patient and family are aware of r/b/a and wish to proceed, informed consent obtained - medical optimization per primary team - surgery is planned for sat morning - family updated - C1-2 fx  per NSU - NPO after midnight - can begin aspirin postop immediately  Thank you for the consult and the opportunity to see Cindy Graham  N. Eduard Roux, MD [facility] 7:36 PM

## 2019-06-22 ENCOUNTER — Encounter (HOSPITAL_COMMUNITY): Payer: Self-pay | Admitting: Internal Medicine

## 2019-06-22 ENCOUNTER — Inpatient Hospital Stay (HOSPITAL_COMMUNITY): Payer: Medicare Other

## 2019-06-22 ENCOUNTER — Inpatient Hospital Stay (HOSPITAL_COMMUNITY): Payer: Medicare Other | Admitting: Certified Registered Nurse Anesthetist

## 2019-06-22 ENCOUNTER — Encounter (HOSPITAL_COMMUNITY)
Admission: EM | Disposition: A | Discharge status: Skilled Nursing Facility | Payer: Self-pay | Source: Skilled Nursing Facility | Attending: Internal Medicine

## 2019-06-22 HISTORY — PX: INTRAMEDULLARY (IM) NAIL INTERTROCHANTERIC: SHX5875

## 2019-06-22 LAB — CBC
HCT: 34.9 % — ABNORMAL LOW (ref 36.0–46.0)
Hemoglobin: 10.9 g/dL — ABNORMAL LOW (ref 12.0–15.0)
MCH: 30.9 pg (ref 26.0–34.0)
MCHC: 31.2 g/dL (ref 30.0–36.0)
MCV: 98.9 fL (ref 80.0–100.0)
Platelets: 189 10*3/uL (ref 150–400)
RBC: 3.53 MIL/uL — ABNORMAL LOW (ref 3.87–5.11)
RDW: 14.6 % (ref 11.5–15.5)
WBC: 16.8 10*3/uL — ABNORMAL HIGH (ref 4.0–10.5)
nRBC: 0 % (ref 0.0–0.2)

## 2019-06-22 LAB — CBC WITH DIFFERENTIAL/PLATELET
Abs Immature Granulocytes: 0.05 10*3/uL (ref 0.00–0.07)
Basophils Absolute: 0.1 10*3/uL (ref 0.0–0.1)
Basophils Relative: 1 %
Eosinophils Absolute: 0.1 10*3/uL (ref 0.0–0.5)
Eosinophils Relative: 1 %
HCT: 37.5 % (ref 36.0–46.0)
Hemoglobin: 12 g/dL (ref 12.0–15.0)
Immature Granulocytes: 0 %
Lymphocytes Relative: 21 %
Lymphs Abs: 2.4 10*3/uL (ref 0.7–4.0)
MCH: 30.9 pg (ref 26.0–34.0)
MCHC: 32 g/dL (ref 30.0–36.0)
MCV: 96.6 fL (ref 80.0–100.0)
Monocytes Absolute: 1.3 10*3/uL — ABNORMAL HIGH (ref 0.1–1.0)
Monocytes Relative: 11 %
Neutro Abs: 7.6 10*3/uL (ref 1.7–7.7)
Neutrophils Relative %: 66 %
Platelets: 243 10*3/uL (ref 150–400)
RBC: 3.88 MIL/uL (ref 3.87–5.11)
RDW: 14.6 % (ref 11.5–15.5)
WBC: 11.5 10*3/uL — ABNORMAL HIGH (ref 4.0–10.5)
nRBC: 0 % (ref 0.0–0.2)

## 2019-06-22 LAB — CREATININE, SERUM
Creatinine, Ser: 1.38 mg/dL — ABNORMAL HIGH (ref 0.44–1.00)
GFR calc Af Amer: 38 mL/min — ABNORMAL LOW
GFR calc non Af Amer: 32 mL/min — ABNORMAL LOW

## 2019-06-22 SURGERY — FIXATION, FRACTURE, INTERTROCHANTERIC, WITH INTRAMEDULLARY ROD
Anesthesia: General | Site: Hip | Laterality: Right

## 2019-06-22 MED ORDER — SORBITOL 70 % SOLN
30.0000 mL | Freq: Every day | Status: DC | PRN
  Administered 2019-06-25: 30 mL via ORAL
  Filled 2019-06-22: qty 30

## 2019-06-22 MED ORDER — MAGNESIUM CITRATE PO SOLN: 1.0000 | Freq: Once | ORAL | Status: DC | PRN

## 2019-06-22 MED ORDER — PHENYLEPHRINE 40 MCG/ML (10ML) SYRINGE FOR IV PUSH (FOR BLOOD PRESSURE SUPPORT)
PREFILLED_SYRINGE | INTRAVENOUS | Status: DC | PRN
  Administered 2019-06-22 (×3): 120 ug via INTRAVENOUS

## 2019-06-22 MED ORDER — ALBUMIN HUMAN 5 % IV SOLN: INTRAVENOUS | Status: DC | PRN

## 2019-06-22 MED ORDER — ENSURE ENLIVE PO LIQD
237.0000 mL | Freq: Two times a day (BID) | ORAL | Status: DC
  Administered 2019-06-22 – 2019-06-25 (×6): 237 mL via ORAL

## 2019-06-22 MED ORDER — ASPIRIN EC 81 MG PO TBEC
81.0000 mg | DELAYED_RELEASE_TABLET | Freq: Every day | ORAL | Status: DC
  Administered 2019-06-23: 09:00:00 81 mg via ORAL
  Filled 2019-06-22: qty 1

## 2019-06-22 MED ORDER — LACTATED RINGERS IV SOLN: INTRAVENOUS | Status: DC

## 2019-06-22 MED ORDER — 0.9 % SODIUM CHLORIDE (POUR BTL) OPTIME
TOPICAL | Status: DC | PRN
  Administered 2019-06-22: 08:00:00 1000 mL

## 2019-06-22 MED ORDER — LACTATED RINGERS IV SOLN: INTRAVENOUS | Status: DC | PRN

## 2019-06-22 MED ORDER — PHENOL 1.4 % MT LIQD: 1.0000 | OROMUCOSAL | Status: DC | PRN

## 2019-06-22 MED ORDER — METHOCARBAMOL 500 MG PO TABS: 500.0000 mg | ORAL_TABLET | Freq: Four times a day (QID) | ORAL | Status: DC | PRN

## 2019-06-22 MED ORDER — ALUM & MAG HYDROXIDE-SIMETH 200-200-20 MG/5ML PO SUSP: 30.0000 mL | ORAL | Status: DC | PRN

## 2019-06-22 MED ORDER — MENTHOL 3 MG MT LOZG: 1.0000 | LOZENGE | OROMUCOSAL | Status: DC | PRN

## 2019-06-22 MED ORDER — KETAMINE HCL 50 MG/ML IJ SOLN
INTRAMUSCULAR | Status: DC | PRN
  Administered 2019-06-22: 10 mg via INTRAMUSCULAR
  Administered 2019-06-22: 20 mg via INTRAMUSCULAR

## 2019-06-22 MED ORDER — SUGAMMADEX SODIUM 200 MG/2ML IV SOLN
INTRAVENOUS | Status: DC | PRN
  Administered 2019-06-22: 150 mg via INTRAVENOUS

## 2019-06-22 MED ORDER — ACETAMINOPHEN 10 MG/ML IV SOLN
INTRAVENOUS | Status: AC
  Filled 2019-06-22: qty 100

## 2019-06-22 MED ORDER — ENOXAPARIN SODIUM 40 MG/0.4ML ~~LOC~~ SOLN
40.0000 mg | SUBCUTANEOUS | Status: DC
  Administered 2019-06-23 – 2019-06-26 (×4): 40 mg via SUBCUTANEOUS
  Filled 2019-06-22 (×4): qty 0.4

## 2019-06-22 MED ORDER — ACETAMINOPHEN 10 MG/ML IV SOLN
INTRAVENOUS | Status: DC | PRN
  Administered 2019-06-22: 1000 mg via INTRAVENOUS

## 2019-06-22 MED ORDER — PROPOFOL 10 MG/ML IV BOLUS
INTRAVENOUS | Status: DC | PRN
  Administered 2019-06-22: 30 mg via INTRAVENOUS
  Administered 2019-06-22: 20 mg via INTRAVENOUS

## 2019-06-22 MED ORDER — METHOCARBAMOL 1000 MG/10ML IJ SOLN
500.0000 mg | Freq: Four times a day (QID) | INTRAVENOUS | Status: DC | PRN
  Filled 2019-06-22: qty 5

## 2019-06-22 MED ORDER — PHENYLEPHRINE HCL-NACL 10-0.9 MG/250ML-% IV SOLN
INTRAVENOUS | Status: DC | PRN
  Administered 2019-06-22: 50 ug/min via INTRAVENOUS

## 2019-06-22 MED ORDER — DOCUSATE SODIUM 100 MG PO CAPS
100.0000 mg | ORAL_CAPSULE | Freq: Two times a day (BID) | ORAL | Status: DC
  Administered 2019-06-22 – 2019-06-26 (×8): 100 mg via ORAL
  Filled 2019-06-22 (×8): qty 1

## 2019-06-22 MED ORDER — ONDANSETRON HCL 4 MG/2ML IJ SOLN: 4.0000 mg | Freq: Four times a day (QID) | INTRAMUSCULAR | Status: DC | PRN

## 2019-06-22 MED ORDER — ENOXAPARIN SODIUM 40 MG/0.4ML ~~LOC~~ SOLN: 40.0000 mg | Freq: Every day | SUBCUTANEOUS | 13 refills | Status: DC

## 2019-06-22 MED ORDER — CEFAZOLIN SODIUM-DEXTROSE 2-4 GM/100ML-% IV SOLN
2.0000 g | Freq: Four times a day (QID) | INTRAVENOUS | Status: AC
  Administered 2019-06-22 – 2019-06-23 (×3): 2 g via INTRAVENOUS
  Filled 2019-06-22 (×3): qty 100

## 2019-06-22 MED ORDER — ACETAMINOPHEN 325 MG PO TABS
325.0000 mg | ORAL_TABLET | Freq: Four times a day (QID) | ORAL | Status: DC | PRN
  Administered 2019-06-23 (×2): 650 mg via ORAL
  Filled 2019-06-22 (×2): qty 2

## 2019-06-22 MED ORDER — KETAMINE HCL 50 MG/5ML IJ SOSY
PREFILLED_SYRINGE | INTRAMUSCULAR | Status: AC
  Filled 2019-06-22: qty 5

## 2019-06-22 MED ORDER — FENTANYL CITRATE (PF) 250 MCG/5ML IJ SOLN
INTRAMUSCULAR | Status: AC
  Filled 2019-06-22: qty 5

## 2019-06-22 MED ORDER — FENTANYL CITRATE (PF) 100 MCG/2ML IJ SOLN
INTRAMUSCULAR | Status: DC | PRN
  Administered 2019-06-22: 25 ug via INTRAVENOUS

## 2019-06-22 MED ORDER — FENTANYL CITRATE (PF) 250 MCG/5ML IJ SOLN
INTRAMUSCULAR | Status: DC | PRN
  Administered 2019-06-22: 25 ug via INTRAVENOUS

## 2019-06-22 MED ORDER — HYDROCODONE-ACETAMINOPHEN 5-325 MG PO TABS: 1.0000 | ORAL_TABLET | Freq: Two times a day (BID) | ORAL | 0 refills | Status: DC | PRN

## 2019-06-22 MED ORDER — ACETAMINOPHEN 500 MG PO TABS
500.0000 mg | ORAL_TABLET | Freq: Four times a day (QID) | ORAL | Status: AC
  Administered 2019-06-22 – 2019-06-23 (×2): 500 mg via ORAL
  Filled 2019-06-22 (×3): qty 1

## 2019-06-22 MED ORDER — SODIUM CHLORIDE 0.9 % IV SOLN: INTRAVENOUS | Status: DC

## 2019-06-22 MED ORDER — PROPOFOL 10 MG/ML IV BOLUS
INTRAVENOUS | Status: AC
  Filled 2019-06-22: qty 20

## 2019-06-22 MED ORDER — ONDANSETRON HCL 4 MG/2ML IJ SOLN
INTRAMUSCULAR | Status: DC | PRN
  Administered 2019-06-22: 4 mg via INTRAVENOUS

## 2019-06-22 MED ORDER — POLYETHYLENE GLYCOL 3350 17 G PO PACK: 17.0000 g | PACK | Freq: Every day | ORAL | Status: DC | PRN

## 2019-06-22 MED ORDER — HYDROCODONE-ACETAMINOPHEN 5-325 MG PO TABS
1.0000 | ORAL_TABLET | ORAL | Status: DC | PRN
  Administered 2019-06-23: 1 via ORAL
  Filled 2019-06-22: qty 1

## 2019-06-22 MED ORDER — ONDANSETRON HCL 4 MG PO TABS: 4.0000 mg | ORAL_TABLET | Freq: Four times a day (QID) | ORAL | Status: DC | PRN

## 2019-06-22 MED ORDER — MORPHINE SULFATE (PF) 2 MG/ML IV SOLN: 1.0000 mg | INTRAVENOUS | Status: DC | PRN

## 2019-06-22 MED ORDER — HYDROCODONE-ACETAMINOPHEN 7.5-325 MG PO TABS: 1.0000 | ORAL_TABLET | ORAL | Status: DC | PRN

## 2019-06-22 MED ORDER — ROCURONIUM BROMIDE 100 MG/10ML IV SOLN
INTRAVENOUS | Status: DC | PRN
  Administered 2019-06-22: 20 mg via INTRAVENOUS
  Administered 2019-06-22: 30 mg via INTRAVENOUS

## 2019-06-22 SURGICAL SUPPLY — 46 items
BIT DRILL SHORT 4.0 (BIT) IMPLANT
BNDG COHESIVE 4X5 TAN STRL (GAUZE/BANDAGES/DRESSINGS) ×2 IMPLANT
BNDG COHESIVE 6X5 TAN STRL LF (GAUZE/BANDAGES/DRESSINGS) IMPLANT
BNDG GAUZE ELAST 4 BULKY (GAUZE/BANDAGES/DRESSINGS) ×2 IMPLANT
COVER PERINEAL POST (MISCELLANEOUS) ×2 IMPLANT
COVER SURGICAL LIGHT HANDLE (MISCELLANEOUS) ×2 IMPLANT
COVER WAND RF STERILE (DRAPES) ×2 IMPLANT
DRAPE C-ARMOR (DRAPES) ×2 IMPLANT
DRAPE STERI IOBAN 125X83 (DRAPES) ×2 IMPLANT
DRILL BIT SHORT 4.0 (BIT) ×1
DRSG MEPILEX BORDER 4X4 (GAUZE/BANDAGES/DRESSINGS) ×4 IMPLANT
DRSG MEPILEX BORDER 4X8 (GAUZE/BANDAGES/DRESSINGS) ×2 IMPLANT
DRSG PAD ABDOMINAL 8X10 ST (GAUZE/BANDAGES/DRESSINGS) ×4 IMPLANT
DURAPREP 26ML APPLICATOR (WOUND CARE) ×2 IMPLANT
ELECT REM PT RETURN 9FT ADLT (ELECTROSURGICAL) ×2
ELECTRODE REM PT RTRN 9FT ADLT (ELECTROSURGICAL) ×1 IMPLANT
GLOVE BIOGEL PI IND STRL 7.0 (GLOVE) ×1 IMPLANT
GLOVE BIOGEL PI INDICATOR 7.0 (GLOVE) ×1
GLOVE ECLIPSE 7.0 STRL STRAW (GLOVE) ×2 IMPLANT
GLOVE SKINSENSE NS SZ7.5 (GLOVE) ×2
GLOVE SKINSENSE STRL SZ7.5 (GLOVE) ×2 IMPLANT
GOWN STRL REIN XL XLG (GOWN DISPOSABLE) ×2 IMPLANT
GUIDE PIN 3.2X343 (PIN) ×2
GUIDE PIN 3.2X343MM (PIN) ×2
GUIDE ROD 3.0 (MISCELLANEOUS) ×2
KIT BASIN OR (CUSTOM PROCEDURE TRAY) ×2 IMPLANT
KIT TURNOVER KIT B (KITS) ×2 IMPLANT
MANIFOLD NEPTUNE II (INSTRUMENTS) ×2 IMPLANT
NAIL TRIGEN 10MMX36CM-125 RT (Nail) ×1 IMPLANT
NS IRRIG 1000ML POUR BTL (IV SOLUTION) ×2 IMPLANT
PACK GENERAL/GYN (CUSTOM PROCEDURE TRAY) ×2 IMPLANT
PAD ARMBOARD 7.5X6 YLW CONV (MISCELLANEOUS) ×4 IMPLANT
PAD CAST 4YDX4 CTTN HI CHSV (CAST SUPPLIES) ×2 IMPLANT
PADDING CAST COTTON 4X4 STRL (CAST SUPPLIES) ×2
PIN GUIDE 3.2X343MM (PIN) IMPLANT
ROD GUIDE 3.0 (MISCELLANEOUS) IMPLANT
SCREW LAG COMPR KIT 85/80 (Screw) ×1 IMPLANT
SCREW TRIGEN LOW PROF 5.0X37.5 (Screw) ×1 IMPLANT
STAPLER VISISTAT 35W (STAPLE) ×2 IMPLANT
SUT VIC AB 0 CT1 27 (SUTURE) ×1
SUT VIC AB 0 CT1 27XBRD ANBCTR (SUTURE) ×1 IMPLANT
SUT VIC AB 2-0 CT1 27 (SUTURE) ×1
SUT VIC AB 2-0 CT1 TAPERPNT 27 (SUTURE) ×1 IMPLANT
TOWEL GREEN STERILE (TOWEL DISPOSABLE) ×2 IMPLANT
TOWEL GREEN STERILE FF (TOWEL DISPOSABLE) ×2 IMPLANT
WATER STERILE IRR 1000ML POUR (IV SOLUTION) ×2 IMPLANT

## 2019-06-22 NOTE — Plan of Care (Signed)
  Problem: Education: Goal: Knowledge of General Education information will improve Description: Including pain rating scale, medication(s)/side effects and non-pharmacologic comfort measures Outcome: Progressing   Problem: Activity: Goal: Risk for activity intolerance will decrease Outcome: Progressing   Problem: Nutrition: Goal: Adequate nutrition will be maintained Outcome: Progressing   Problem: Coping: Goal: Level of anxiety will decrease Outcome: Progressing   Problem: Pain Managment: Goal: General experience of comfort will improve Outcome: Progressing   Problem: Skin Integrity: Goal: Risk for impaired skin integrity will decrease Outcome: Progressing   

## 2019-06-22 NOTE — Plan of Care (Signed)

## 2019-06-22 NOTE — Progress Notes (Signed)
Initial Nutrition Assessment  DOCUMENTATION CODES:   Not applicable  INTERVENTION:   Ensure Enlive po BID, each supplement provides 350 kcal and 20 grams of protein  MVI daily   Recommend Oscal w/ D po BID   NUTRITION DIAGNOSIS:   Increased nutrient needs related to post-op healing(COPD) as evidenced by increased estimated needs.  GOAL:   Patient will meet greater than or equal to 90% of their needs  MONITOR:   PO intake, Supplement acceptance, Labs, Weight trends, Skin, I & O's  REASON FOR ASSESSMENT:   Consult Assessment of nutrition requirement/status  ASSESSMENT:   86 y.o. female with medical history significant for HTN and COPD who presents with right hip fracture s/p mechanical fall at assisted living facility now s/p repair 1/30.Pt also noted to have acute C1 and C2 fractures  RD working remotely.  Unable to reach pt via phone. Pt s/p hip fracture repair this morning. Pt with increased estimated needs r/t post op healing. RD will add supplements and MVI to help pt meet her estimated needs. Per chart, pt down ~20lbs(12%) over the past year.   Medications reviewed and include: vitamin C, aspirin, colace, lovenox, MVI, protonix, NaCl @75ml /hr, cefazolin  Labs reviewed:   Unable to complete Nutrition-Focused physical exam at this time.   Diet Order:   Diet Order            Diet regular Room service appropriate? Yes; Fluid consistency: Thin  Diet effective now             EDUCATION NEEDS:   Not appropriate for education at this time  Skin:  Skin Assessment: Reviewed RN Assessment(ecchymosis, incision R hip)  Last BM:  1/28  Height:   Ht Readings from Last 1 Encounters:  06/21/19 5\' 3"  (1.6 m)    Weight:   Wt Readings from Last 1 Encounters:  06/21/19 68 kg    Ideal Body Weight:  52.3 kg  BMI:  Body mass index is 26.56 kg/m.  Estimated Nutritional Needs:   Kcal:  1400-1600kcal/day  Protein:  70-80g/day  Fluid:  >1.3L/day   Koleen Distance MS, RD, LDN Pager #- [phone] Office#- [phone] After Hours Pager: [phone]

## 2019-06-22 NOTE — Progress Notes (Signed)
PT Cancellation Note  Patient Details Name: Cindy Graham MRN: [id number] DOB: 18-Oct-1921   Cancelled Treatment:    Reason Eval/Treat Not Completed: Patient at procedure or test/unavailable. Pt in OR. PT will continue to f/u with pt acutely as available.    [place] 06/22/2019, 7:34 AM

## 2019-06-22 NOTE — H&P (Signed)

## 2019-06-22 NOTE — Anesthesia Procedure Notes (Signed)
Procedure Name: Intubation Date/Time: 06/22/2019 8:38 AM Performed by: Griffin Dakin, CRNA Pre-anesthesia Checklist: Patient identified, Emergency Drugs available, Suction available and Patient being monitored Patient Re-evaluated:Patient Re-evaluated prior to induction Oxygen Delivery Method: Circle system utilized Preoxygenation: Pre-oxygenation with 100% oxygen Induction Type: IV induction Ventilation: Mask ventilation without difficulty Laryngoscope Size: Glidescope and 3 Grade View: Grade I Tube type: Oral Tube size: 7.0 mm Number of attempts: 1 Airway Equipment and Method: Video-laryngoscopy and Rigid stylet Placement Confirmation: ETT inserted through vocal cords under direct vision,  positive ETCO2 and breath sounds checked- equal and bilateral Secured at: 22 cm Tube secured with: Tape Dental Injury: Teeth and Oropharynx as per pre-operative assessment  Comments: c-spine precautions maintained throughout intubation.

## 2019-06-22 NOTE — Transfer of Care (Signed)
Immediate Anesthesia Transfer of Care Note  Patient: Cindy Graham  Procedure(s) Performed: INTRAMEDULLARY (IM) NAIL INTERTROCHANTRIC (Right Hip)  Patient Location: PACU  Anesthesia Type:General  Level of Consciousness: awake, alert  and oriented  Airway & Oxygen Therapy: Patient Spontanous Breathing and Patient connected to nasal cannula oxygen  Post-op Assessment: Report given to RN and Post -op Vital signs reviewed and stable  Post vital signs: Reviewed and stable  Last Vitals:  Vitals Value Taken Time  BP 91/74 06/22/19 1005  Temp 36.4 C 06/22/19 1005  Pulse 71 06/22/19 1009  Resp 19 06/22/19 1009  SpO2 100 % 06/22/19 1009  Vitals shown include unvalidated device data.  Last Pain:  Vitals:   06/22/19 1005  TempSrc:   PainSc: Asleep      Patients Stated Pain Goal: 3 ([id number])  Complications: No apparent anesthesia complications

## 2019-06-22 NOTE — Anesthesia Postprocedure Evaluation (Signed)
Anesthesia Post Note  Patient: Cindy Graham  Procedure(s) Performed: INTRAMEDULLARY (IM) NAIL INTERTROCHANTRIC (Right Hip)     Patient location during evaluation: PACU Anesthesia Type: General Level of consciousness: awake and alert Pain management: pain level controlled Vital Signs Assessment: post-procedure vital signs reviewed and stable Respiratory status: spontaneous breathing, nonlabored ventilation, respiratory function stable and patient connected to nasal cannula oxygen Cardiovascular status: blood pressure returned to baseline and stable Postop Assessment: no apparent nausea or vomiting Anesthetic complications: no    Last Vitals:  Vitals:   06/22/19 1034 06/22/19 1052  BP: (!) 104/44 (!) 112/40  Pulse: 68 69  Resp: 17   Temp: 36.5 C (!) 36.3 C  SpO2: 100% 97%    Last Pain:  Vitals:   06/22/19 1052  TempSrc: Oral  PainSc:                  Stevey Stapleton,W. EDMOND

## 2019-06-22 NOTE — Progress Notes (Signed)
PROGRESS NOTE    CHIQUETTA Graham  [id number] DOB: Jun 19, 1921 DOA: 06/21/2019 PCP: Orvis Brill, [facility]   Brief Narrative: 86 year old with past medical history significant for hypertension who presented after a fall at her assisted living facility.  Patient fell while transferring from bed to wheelchair.  She also hit her head but denies LOC.    Evaluation in the ED CT showed;  Acute fractures of the posterior arch of C1 and right C1 and C2 transverse processes and transverse foramina. There is distraction of the right C1-C2 articulation.and right intertrochanteric femur fracture   Assessment & Plan:   Principal Problem:   Displaced intertrochanteric fracture of right femur, initial encounter for closed fracture [facility])   1-Right intertrochanteric femur fracture: Patient underwent open treatment of intertrochanteric fracture with intramedullary implant on 06/22/2019 by Dr. Erlinda Hong Patient seen post surgery, alert, answering some questions. Pain management and DVT prophylaxis per Ortho. Bowel regimen  2-Acute fracture of the posterior arch of C1 and and right C1 and C2 transverse process and transverse foramina  Fracture. -Patient evaluated by neurosurgery.  They recommended a CT angio neck that showed  Suspected focal right vertebral traumatic injury at the C2 level. with tiny pseudoaneurysm. -Neurosurgery recommend conservative treatment with aspirin immobilization, patient can be on DVT prophylaxis follow her hip fracture surgery.  She will need a repeat CTA in 4 to 6 weeks to evaluate RVA dissection/pseudoaneurysm.  3-Hypertension: Continue with verapamil, Inderal.  Hold lisinopril now that creatinine has increased to 1.3  COPD: On Pulmicort.. Leukocytosis: Suspect related to demargination.  Follow trend. AKI: Continue with IV fluids.  Hold ACE. Acute blood loss anemia: Post surgery expected follow trend. Transfusion required at this time. Nutrition Problem: Increased  nutrient needs Etiology: post-op healing(COPD)    Signs/Symptoms: estimated needs       Estimated body mass index is 26.56 kg/m as calculated from the following:   Height as of this encounter: 5\' 3"  (1.6 m).   Weight as of this encounter: 68 kg.   DVT prophylaxis: Lovenox Code Status: DNR Family Communication: No family at bedside Disposition Plan:  Patient from assisted living facility Suspect that she will require skilled nursing facility She just had surgery today 1/30 for hip fracture. Consultants:   Dr. Erlinda Hong with orthopedic  NeuroSurgery  Procedures:  Patient underwent open treatment of intertrochanteric fracture with intramedullary implant on 06/22/2019  Antimicrobials:  None  Subjective: Patient just came from surgery, she is sleepy but she will wake up answer few questions  Objective: Vitals:   06/22/19 1005 06/22/19 1018 06/22/19 1034 06/22/19 1052  BP: 91/74 (!) 118/48 (!) 104/44 (!) 112/40  Pulse: 74 71 68 69  Resp: 15 19 17    Temp: 97.6 F (36.4 C)  97.7 F (36.5 C) (!) 97.4 F (36.3 C)  TempSrc:    Oral  SpO2: 100% 100% 100% 97%  Weight:      Height:        Intake/Output Summary (Last 24 hours) at 06/22/2019 1537 Last data filed at 06/22/2019 1003 Gross per 24 hour  Intake 1550 ml  Output 700 ml  Net 850 ml   Filed Weights   06/21/19 1744 06/21/19 2200  Weight: 68.3 kg 68 kg    Examination:  General exam: Appears calm and comfortable  Respiratory system: Clear to auscultation. Respiratory effort normal. Cardiovascular system: S1 & S2 heard, RRR.  Gastrointestinal system: Abdomen is nondistended, soft and nontender. No organomegaly or masses felt. Normal bowel sounds heard. Central nervous  system: Alert and oriented. No focal neurological deficits. Extremities: Right hip with clean dressing Skin: No rashes, lesions or ulcers    Data Reviewed: I have personally reviewed following labs and imaging studies  CBC: Recent Labs  Lab  06/21/19 1059 06/22/19 0452 06/22/19 1120  WBC 8.1 11.5* 16.8*  NEUTROABS 4.1 7.6  --   HGB 13.5 12.0 10.9*  HCT 42.6 37.5 34.9*  MCV 95.7 96.6 98.9  PLT 262 243 [id number]   Basic Metabolic Panel: Recent Labs  Lab 06/21/19 1059 06/22/19 1120  NA 139  --   K 4.1  --   CL 100  --   CO2 29  --   GLUCOSE 109*  --   BUN 23  --   CREATININE 0.96 1.38*  CALCIUM 9.5  --    GFR: Estimated Creatinine Clearance: 22.6 mL/min (A) (by C-G formula based on SCr of 1.38 mg/dL (H)). Liver Function Tests: No results for input(s): AST, ALT, ALKPHOS, BILITOT, PROT, ALBUMIN in the last 168 hours. No results for input(s): LIPASE, AMYLASE in the last 168 hours. No results for input(s): AMMONIA in the last 168 hours. Coagulation Profile: Recent Labs  Lab 06/21/19 2001  INR 0.9   Cardiac Enzymes: No results for input(s): CKTOTAL, CKMB, CKMBINDEX, TROPONINI in the last 168 hours. BNP (last 3 results) No results for input(s): PROBNP in the last 8760 hours. HbA1C: No results for input(s): HGBA1C in the last 72 hours. CBG: No results for input(s): GLUCAP in the last 168 hours. Lipid Profile: No results for input(s): CHOL, HDL, LDLCALC, TRIG, CHOLHDL, LDLDIRECT in the last 72 hours. Thyroid Function Tests: No results for input(s): TSH, T4TOTAL, FREET4, T3FREE, THYROIDAB in the last 72 hours. Anemia Panel: No results for input(s): VITAMINB12, FOLATE, FERRITIN, TIBC, IRON, RETICCTPCT in the last 72 hours. Sepsis Labs: No results for input(s): PROCALCITON, LATICACIDVEN in the last 168 hours.  Recent Results (from the past 240 hour(s))  Respiratory Panel by RT PCR (Flu A&B, Covid) - Nasopharyngeal Swab     Status: None   Collection Time: 06/21/19  1:58 PM   Specimen: Nasopharyngeal Swab  Result Value Ref Range Status   SARS Coronavirus 2 by RT PCR NEGATIVE NEGATIVE Final    Comment: (NOTE) SARS-CoV-2 target nucleic acids are NOT DETECTED. The SARS-CoV-2 RNA is generally detectable in upper  respiratoy specimens during the acute phase of infection. The lowest concentration of SARS-CoV-2 viral copies this assay can detect is 131 copies/mL. A negative result does not preclude SARS-Cov-2 infection and should not be used as the sole basis for treatment or other patient management decisions. A negative result may occur with  improper specimen collection/handling, submission of specimen other than nasopharyngeal swab, presence of viral mutation(s) within the areas targeted by this assay, and inadequate number of viral copies (<131 copies/mL). A negative result must be combined with clinical observations, patient history, and epidemiological information. The expected result is Negative. Fact Sheet for Patients:  PinkCheek.be Fact Sheet for Healthcare Providers:  GravelBags.it This test is not yet ap proved or cleared by the [place] FDA and  has been authorized for detection and/or diagnosis of SARS-CoV-2 by FDA under an Emergency Use Authorization (EUA). This EUA will remain  in effect (meaning this test can be used) for the duration of the COVID-19 declaration under Section 564(b)(1) of the Act, 21 U.S.C. section 360bbb-3(b)(1), unless the authorization is terminated or revoked sooner.    Influenza A by PCR NEGATIVE NEGATIVE Final   Influenza B  by PCR NEGATIVE NEGATIVE Final    Comment: (NOTE) The Xpert Xpress SARS-CoV-2/FLU/RSV assay is intended as an aid in  the diagnosis of influenza from Nasopharyngeal swab specimens and  should not be used as a sole basis for treatment. Nasal washings and  aspirates are unacceptable for Xpert Xpress SARS-CoV-2/FLU/RSV  testing. Fact Sheet for Patients: PinkCheek.be Fact Sheet for Healthcare Providers: GravelBags.it This test is not yet approved or cleared by the [place] FDA and  has been authorized for  detection and/or diagnosis of SARS-CoV-2 by  FDA under an Emergency Use Authorization (EUA). This EUA will remain  in effect (meaning this test can be used) for the duration of the  Covid-19 declaration under Section 564(b)(1) of the Act, 21  U.S.C. section 360bbb-3(b)(1), unless the authorization is  terminated or revoked. Performed at [facility], [address], [place], [place] [address]   Surgical pcr screen     Status: None   Collection Time: 06/21/19  7:57 PM   Specimen: Nasal Mucosa; Nasal Swab  Result Value Ref Range Status   MRSA, PCR NEGATIVE NEGATIVE Final   Staphylococcus aureus NEGATIVE NEGATIVE Final    Comment: (NOTE) The Xpert SA Assay (FDA approved for NASAL specimens in patients 74 years of age and older), is one component of a comprehensive surveillance program. It is not intended to diagnose infection nor to guide or monitor treatment. Performed at [facility], [place] [address], [place], [place] [address]          Radiology Studies: DG Chest 1 View  Result Date: 06/21/2019 CLINICAL DATA:  Fall.  Right hip fracture. EXAM: CHEST  1 VIEW COMPARISON:  Chest x-ray dated February 27, 2019. FINDINGS: The heart size and mediastinal contours are within normal limits. Normal pulmonary vascularity. No focal consolidation, pleural effusion, or pneumothorax. Unchanged elevation of the right hemidiaphragm. No acute osseous abnormality. IMPRESSION: No active disease. Electronically Signed   By: Titus Dubin M.D.   On: 06/21/2019 12:54   DG Knee 2 Views Right  Result Date: 06/21/2019 CLINICAL DATA:  Right knee pain after fall. EXAM: RIGHT KNEE - 1-2 VIEW COMPARISON:  None. FINDINGS: No acute fracture or dislocation. No large joint effusion, although evaluation is limited due to suboptimal positioning on the cross-table lateral view. Moderate to severe medial compartment joint space narrowing. Medial and lateral compartment marginal osteophytes. Osteopenia.  Vascular calcifications. IMPRESSION: 1.  No acute osseous abnormality. 2. Moderate to severe medial compartment osteoarthritis. Electronically Signed   By: Titus Dubin M.D.   On: 06/21/2019 12:52   CT Head Wo Contrast  Result Date: 06/21/2019 CLINICAL DATA:  Head trauma EXAM: CT HEAD WITHOUT CONTRAST TECHNIQUE: Contiguous axial images were obtained from the base of the skull through the vertex without intravenous contrast. COMPARISON:  04/14/2019 FINDINGS: Brain: There is no acute intracranial hemorrhage, mass-effect, or edema. Gray-white differentiation is preserved. There is no extra-axial fluid collection. Stable left frontal convexity calcified meningioma. Ventricles and sulci are stable in size and configuration. Patchy hypoattenuation in the supratentorial white matter is nonspecific but likely reflects stable chronic microvascular ischemic changes. Vascular: There is atherosclerotic calcification at the skull base. Skull: Right frontal calvarium osteoma.  Otherwise unremarkable. Sinuses/Orbits: No acute finding. Other: None. IMPRESSION: No evidence of acute intracranial injury. Stable chronic findings detailed above. Electronically Signed   By: Macy Mis M.D.   On: 06/21/2019 12:24   CT Angio Neck W and/or Wo Contrast  Result Date: 06/21/2019 CLINICAL DATA:  Cervical  spine fracture EXAM: CT ANGIOGRAPHY NECK TECHNIQUE: Multidetector CT imaging of the neck was performed using the standard protocol during bolus administration of intravenous contrast. Multiplanar CT image reconstructions and MIPs were obtained to evaluate the vascular anatomy. Carotid stenosis measurements (when applicable) are obtained utilizing NASCET criteria, using the distal internal carotid diameter as the denominator. CONTRAST:  67mL OMNIPAQUE IOHEXOL 350 MG/ML SOLN COMPARISON:  None. FINDINGS: Aortic arch: Calcified plaque along the arch and great vessel origins, which are patent. Right carotid system: Patent. No  measurable stenosis at the ICA origin. Left carotid system: Patent. Mild calcified plaque at the bifurcation. No measurable stenosis of the ICA origin. Vertebral arteries: Extracranial vertebral arteries are patent. There is mild luminal irregularity of the right vertebral artery at the C2 level in proximity to fractures. Suspected sub 2 mm superiorly directed pseudoaneurysm (best seen on series 7, image 123). Skeleton: Cervical spine fractures as previously described. Other neck: No mass or adenopathy. Upper chest: Included upper lungs are clear. IMPRESSION: Suspected focal right vertebral traumatic injury at the C2 level with tiny pseudoaneurysm. Electronically Signed   By: Macy Mis M.D.   On: 06/21/2019 15:28   CT Cervical Spine Wo Contrast  Result Date: 06/21/2019 CLINICAL DATA:  Fall EXAM: CT CERVICAL SPINE WITHOUT CONTRAST TECHNIQUE: Multidetector CT imaging of the cervical spine was performed without intravenous contrast. Multiplanar CT image reconstructions were also generated. COMPARISON:  2019 FINDINGS: Alignment: Stable. Skull base and vertebrae: Acute nondisplaced fracture through the posterior arch of C1 on the left. There is distraction at the right lateral mass C1-C2 articulation. Acute fractures are present through the right posterior arch of C1 as well as the right C1 transverse foramen. Comminuted acute fractures through the right transverse process and transverse foramen of C2. Vertebral body heights are preserved. Soft tissues and spinal canal: Stable retrodental likely degenerative soft tissue. Disc levels: Insert degenerative. There is no high-grade osseous encroachment on the spinal canal. Upper chest: No acute abnormality. Other: None. IMPRESSION: Acute fractures of the posterior arch of C1 and right C1 and C2 transverse processes and transverse foramina. There is distraction of the right C1-C2 articulation. Vascular imaging recommended to exclude vertebral artery injury.  Electronically Signed   By: Macy Mis M.D.   On: 06/21/2019 12:40   DG C-Arm 1-60 Min  Result Date: 06/22/2019 CLINICAL DATA:  ORIF right hip fracture. EXAM: OPERATIVE RIGHT HIP (WITH PELVIS IF PERFORMED) 6 VIEWS TECHNIQUE: Fluoroscopic spot image(s) were submitted for interpretation post-operatively. COMPARISON:  Right hip radiographs-06/21/2019 FINDINGS: 6 spot intraoperative fluoroscopic images of the right hip are provided for review. Images demonstrate the sequela intramedullary rod fixation of the right femur and dynamic screw fixation of the right femoral neck transfixing the known comminuted intertrochanteric femur fracture. Alignment appears near anatomic. The distal end of the intramedullary rod is transfixed with a single cancellous screw. No evidence of hardware failure or loosening. There is a minimal amount of expected subcutaneous emphysema about the operative site. No radiopaque body. IMPRESSION: Post uncomplicated ORIF of intertrochanteric femur fracture. Electronically Signed   By: Sandi Mariscal M.D.   On: 06/22/2019 10:01   DG HIP OPERATIVE UNILAT W OR W/O PELVIS RIGHT  Result Date: 06/22/2019 CLINICAL DATA:  ORIF right hip fracture. EXAM: OPERATIVE RIGHT HIP (WITH PELVIS IF PERFORMED) 6 VIEWS TECHNIQUE: Fluoroscopic spot image(s) were submitted for interpretation post-operatively. COMPARISON:  Right hip radiographs-06/21/2019 FINDINGS: 6 spot intraoperative fluoroscopic images of the right hip are provided for review. Images demonstrate the sequela  intramedullary rod fixation of the right femur and dynamic screw fixation of the right femoral neck transfixing the known comminuted intertrochanteric femur fracture. Alignment appears near anatomic. The distal end of the intramedullary rod is transfixed with a single cancellous screw. No evidence of hardware failure or loosening. There is a minimal amount of expected subcutaneous emphysema about the operative site. No radiopaque body.  IMPRESSION: Post uncomplicated ORIF of intertrochanteric femur fracture. Electronically Signed   By: Sandi Mariscal M.D.   On: 06/22/2019 10:01   DG Hip Unilat W or Wo Pelvis 2-3 Views Right  Result Date: 06/21/2019 CLINICAL DATA:  Right hip pain after fall. EXAM: DG HIP (WITH OR WITHOUT PELVIS) 2-3V RIGHT COMPARISON:  November 03, 2017. FINDINGS: Acute angulated right intertrochanteric femur fracture. No dislocation. The pubic symphysis and sacroiliac joints are intact. Osteopenia. IMPRESSION: Acute angulated right intertrochanteric femur fracture. Electronically Signed   By: Titus Dubin M.D.   On: 06/21/2019 12:50        Scheduled Meds: . acetaminophen  500 mg Oral Q6H  . ascorbic acid  500 mg Oral BID  . aspirin  325 mg Oral QHS  . budesonide  0.25 mg Nebulization BID  . docusate sodium  100 mg Oral BID  . enalapril  20 mg Oral Daily  . [START ON 06/23/2019] enoxaparin (LOVENOX) injection  40 mg Subcutaneous Q24H  . feeding supplement (ENSURE ENLIVE)  237 mL Oral BID BM  . gabapentin  300 mg Oral QHS  . multivitamin with minerals  1 tablet Oral Daily  . oxybutynin  5 mg Oral Daily  . pantoprazole  40 mg Oral Daily  . propranolol  60 mg Oral Daily  . sertraline  100 mg Oral Daily  . tapentadol  50 mg Oral TID  . verapamil  240 mg Oral QHS   Continuous Infusions: . sodium chloride 75 mL/hr at 06/22/19 1102  .  ceFAZolin (ANCEF) IV 2 g (06/22/19 1335)  . methocarbamol (ROBAXIN) IV       LOS: 1 day    Time spent: 35 minutes.     Elmarie Shiley, MD [facility]   If 7PM-7AM, please contact night-coverage  06/22/2019, 3:37 PM

## 2019-06-22 NOTE — Anesthesia Procedure Notes (Signed)
Spinal  Patient location during procedure: OR Start time: 06/22/2019 7:50 AM End time: 06/22/2019 7:59 AM Staffing Performed: anesthesiologist  Anesthesiologist: Roderic Palau, MD Preanesthetic Checklist Completed: patient identified, IV checked, risks and benefits discussed, surgical consent, monitors and equipment checked, pre-op evaluation and timeout performed Spinal Block Patient position: left lateral decubitus Prep: DuraPrep Patient monitoring: cardiac monitor, continuous pulse ox and blood pressure Approach: midline Location: L3-4 Injection technique: single-shot Needle Needle type: Quincke  Needle gauge: 22 G Needle length: 9 cm Additional Notes Functioning IV was confirmed and monitors were applied. Sterile prep and drape, including hand hygiene and sterile gloves were used. The patient was positioned and the spine was prepped. The skin was anesthetized with lidocaine.  Free flow of clear CSF was obtained prior to injecting local anesthetic into the CSF.  The spinal needle aspirated freely following injection.  The needle was carefully withdrawn.  The patient tolerated the procedure well. After positioning the patient and giving time to allow the spinal to set up the patient continued to have feeling in her legs. Due to failed spinal we elected to proceed with GA using a glidescope for intubation. Minimal neck movement for ETT placement.

## 2019-06-22 NOTE — Discharge Instructions (Signed)
° ° °  1. Change dressings as needed °2. May shower but keep incisions covered and dry °3. Take lovenox to prevent blood clots °4. Take stool softeners as needed °5. Take pain meds as needed ° °

## 2019-06-22 NOTE — Progress Notes (Signed)
Orthopedic Tech Progress Note Patient Details:  CORLIS STFLEUR 18-Jan-1922 [id number] RN called requesting a SMALLER PHILLY collar for patient. I told him that I could not remove the one she had on but he could. Ortho Devices Type of Ortho Device: [place] cervical collar, Soft collar Ortho Device/Splint Location: NECK Ortho Device/Splint Interventions: Ordered, Application   Post Interventions Patient Tolerated: Well Instructions Provided: Care of device, Adjustment of device   [name] 06/22/2019, 2:21 PM

## 2019-06-22 NOTE — Op Note (Signed)
   Date of Surgery: 06/22/2019  INDICATIONS: Ms. Cindy Graham is a 86 y.o.-year-old female who sustained a right hip fracture. The risks and benefits of the procedure discussed with the patient prior to the procedure and all questions were answered; consent was obtained.  PREOPERATIVE DIAGNOSIS: right intertrochanteric hip fracture   POSTOPERATIVE DIAGNOSIS: Same   PROCEDURE: Open treatment of intertrochanteric fracture with intramedullary implant. CPT [phone]   SURGEON: N. Eduard Roux, M.D.   ASSIST: none  ANESTHESIA: general   IV FLUIDS AND URINE: See anesthesia record   ESTIMATED BLOOD LOSS: 300 cc  IMPLANTS: Smith [employer] Nephew InterTAN 10 x 36, 85/80 lag screws  DRAINS: None.   COMPLICATIONS: see description of procedure.   DESCRIPTION OF PROCEDURE: The patient was brought to the operating room and placed supine on the operating table. The patient's leg had been signed prior to the procedure. The patient had the anesthesia placed by the anesthesiologist. The prep verification and incision time-outs were performed to confirm that this was the correct patient, site, side and location. The patient had an SCD on the opposite lower extremity. The patient did receive antibiotics prior to the incision and was re-dosed during the procedure as needed at indicated intervals. The patient was positioned on the fracture table with the table in traction and internal rotation to reduce the hip. The well leg was placed in a scissor position and all bony prominences were well-padded. The patient had the lower extremity prepped and draped in the standard surgical fashion. The incision was made 4 finger breadths superior to the greater trochanter. A guide pin was inserted into the tip of the greater trochanter under fluoroscopic guidance. An opening reamer was used to gain access to the femoral canal.  Sequential reaming was performed up to 11.5 mm. The nail length was measured and inserted down the femoral canal to  its proper depth. The appropriate version of insertion for the lag screw was found under fluoroscopy. A pin was inserted up the femoral neck through the jig. Then, a second antirotation pin was inserted inferior to the first pin. The length of the lag screw was then measured. The lag screw was inserted as near to center-center in the head as possible. The antirotation pin was then taken out and an interdigitating compression screw was placed in its place. The leg was taken out of traction, then the interdigitating compression screw was used to compress across the fracture. Compression was visualized on serial xrays.  A distal interlocking screw was placed using the perfect circle technique.  The wound was copiously irrigated with saline and the subcutaneous layer closed with 2.0 vicryl and the skin was reapproximated with staples. The wounds were cleaned and dried a final time and a sterile dressing was placed. The hip was taken through a range of motion at the end of the case under fluoroscopic imaging to visualize the approach-withdraw phenomenon and confirm implant length in the head. The patient was then awakened from anesthesia and taken to the recovery room in stable condition. All counts were correct at the end of the case.   POSTOPERATIVE PLAN: The patient will be weight bearing as tolerated and will return in 2 weeks for staple removal and the patient will receive DVT prophylaxis based on other medications, activity level, and risk ratio of bleeding to thrombosis.   Azucena Cecil, MD [facility] 9:48 AM

## 2019-06-23 ENCOUNTER — Encounter (HOSPITAL_COMMUNITY): Payer: Self-pay | Admitting: Internal Medicine

## 2019-06-23 LAB — BASIC METABOLIC PANEL
Anion gap: 11 (ref 5–15)
BUN: 31 mg/dL — ABNORMAL HIGH (ref 8–23)
CO2: 23 mmol/L (ref 22–32)
Calcium: 7.6 mg/dL — ABNORMAL LOW (ref 8.9–10.3)
Chloride: 105 mmol/L (ref 98–111)
Creatinine, Ser: 1.38 mg/dL — ABNORMAL HIGH (ref 0.44–1.00)
GFR calc Af Amer: 38 mL/min — ABNORMAL LOW (ref >60)
GFR calc non Af Amer: 32 mL/min — ABNORMAL LOW (ref >60)
Glucose, Bld: 128 mg/dL — ABNORMAL HIGH (ref 70–99)
Potassium: 4 mmol/L (ref 3.5–5.1)
Sodium: 139 mmol/L (ref 135–145)

## 2019-06-23 LAB — CBC
HCT: 24.6 % — ABNORMAL LOW (ref 36.0–46.0)
Hemoglobin: 7.6 g/dL — ABNORMAL LOW (ref 12.0–15.0)
MCH: 31.1 pg (ref 26.0–34.0)
MCHC: 30.9 g/dL (ref 30.0–36.0)
MCV: 100.8 fL — ABNORMAL HIGH (ref 80.0–100.0)
Platelets: 159 10*3/uL (ref 150–400)
RBC: 2.44 MIL/uL — ABNORMAL LOW (ref 3.87–5.11)
RDW: 14.8 % (ref 11.5–15.5)
WBC: 11.8 10*3/uL — ABNORMAL HIGH (ref 4.0–10.5)
nRBC: 0 % (ref 0.0–0.2)

## 2019-06-23 LAB — PREPARE RBC (CROSSMATCH)

## 2019-06-23 MED ORDER — SODIUM CHLORIDE 0.9% IV SOLUTION: Freq: Once | INTRAVENOUS | Status: AC

## 2019-06-23 MED ORDER — FUROSEMIDE 10 MG/ML IJ SOLN
20.0000 mg | Freq: Once | INTRAMUSCULAR | Status: AC
  Administered 2019-06-23: 10:00:00 20 mg via INTRAVENOUS
  Filled 2019-06-23: qty 2

## 2019-06-23 MED ORDER — PROPRANOLOL HCL 40 MG PO TABS
40.0000 mg | ORAL_TABLET | Freq: Every day | ORAL | Status: DC
  Administered 2019-06-23 – 2019-06-26 (×4): 40 mg via ORAL
  Filled 2019-06-23 (×4): qty 1

## 2019-06-23 MED ORDER — ALBUTEROL SULFATE (2.5 MG/3ML) 0.083% IN NEBU: 2.5000 mg | INHALATION_SOLUTION | Freq: Four times a day (QID) | RESPIRATORY_TRACT | Status: DC | PRN

## 2019-06-23 MED ORDER — ASPIRIN 325 MG PO TABS: 325.0000 mg | ORAL_TABLET | Freq: Every day | ORAL | Status: DC

## 2019-06-23 NOTE — Progress Notes (Signed)
Orthopedic Tech Progress Note Patient Details:  Cindy Graham Feb 13, 1922 [id number] Called order to [employer] for a SMALL ASPEN COLLAR. RN said MD was mad patient had on wrong collar. Wanted a black hard one. So I called in to [employer] and let them know  Patient ID: Cindy Graham, female   DOB: 08/09/1921, 86 y.o.   MRN: [id number]   [name] 06/23/2019, 8:54 AM

## 2019-06-23 NOTE — Progress Notes (Signed)
  NEUROSURGERY PROGRESS NOTE   No issues overnight.  Complains of appropriate neck soreness No new N/T/W ASA started yesterday for vert injury  EXAM:  BP (!) 105/38 (BP Location: Right Arm)   Pulse 96   Temp 98.9 F (37.2 C) (Oral)   Resp 16   Ht 5\' 3"  (1.6 m)   Wt 68 kg   SpO2 96%   BMI 26.56 kg/m   Awake, alert, oriented  Speech fluent, appropriate  CN grossly intact  5/5 BUE/BLE, weakness right hip  IMPRESSION/PLAN 86 y.o. female with C1 and C2 fractures and associated right vert injury. Right hip fracture s/p IM nail with Dr Domingo Madeira. Neurologically stable. - Continue aspen c collar at all times - Vert injury - ASA 325mg  daily. Started yesterday with no issue. - No new NS recs. Plan on repeat CTA in 4-6 weeks to evaluate RVA dissection/pseudoaneurysm.  Please call for any concerns

## 2019-06-23 NOTE — Evaluation (Signed)
Physical Therapy Evaluation Patient Details Name: Cindy Graham MRN: [id number] DOB: 1921/09/20 Today's Date: 06/23/2019   History of Present Illness  Cindy Graham is a 86 y.o. female with medical history significant for HTN,  who presents with right hip fracture s/p mechanical fall at assisted living facility.  Had a episode of fall while transferring from bed to wheelchair, and hit her head but denies LOC.  Denies fever/chills/nausea/vomiting. Walks with assistive devices at baseline (walker, cane, wheelchair). CT showing posterior arch of C1 and right C1 fracture, and right intertrochanteric femur fracture. Aspen collar at all times; now s/p Open treatment of intertrochanteric fracture with intramedullary implant, WBAT  Clinical Impression   Pt admitted with above diagnosis. Pt PTA: living in ALF and was mostly using w/c for mobility, HOH at baseline and requiring assist with ADL. Pt's son in room to confirm details. Pt currently limited by pain, HOH, decreased arousal, decreased strength and inability to care for self. Pt totalA +2 for supine to sit helicopter approach with no active initiation of movement from pt. Pt grimacing with movement, but once settled, pt calm. Pt maxA to totalA for sitting balance. Pt currently with functional limitations due to the deficits listed below (see PT Problem List). Pt will benefit from skilled PT to increase their independence and safety with mobility to allow discharge to the venue listed below.       Follow Up Recommendations SNF    Equipment Recommendations  Other (comment)(to be determined)    Recommendations for Other Services OT consult     Precautions / Restrictions Precautions Precautions: Fall;Cervical;Other (comment) Precaution Booklet Issued: No Precaution Comments: verbal discussion of cervical precautions; watch O2 Required Braces or Orthoses: Cervical Brace Cervical Brace: Hard collar;At all times Restrictions Weight Bearing  Restrictions: Yes RLE Weight Bearing: Weight bearing as tolerated      Mobility  Bed Mobility Overal bed mobility: Needs Assistance Bed Mobility: Supine to Sit;Sit to Supine     Supine to sit: Total assist;+2 for physical assistance Sit to supine: Total assist;+2 for physical assistance   General bed mobility comments: Pt totalA +2 for bed mobility; no initiation of movement noted  Transfers                 General transfer comment: deferred as pt maxA to totalA at [facility]  Ambulation/Gait                Stairs            Wheelchair Mobility    Modified Rankin (Stroke Patients Only)       Balance Overall balance assessment: Needs assistance Sitting-balance support: Bilateral upper extremity supported;Feet supported Sitting balance-Leahy Scale: Zero Sitting balance - Comments: maxA to totalA at EOB for static sitting; able to take meds and sips of ensure from a spoon and tehn a straw while sitting up                                     Pertinent Vitals/Pain Pain Assessment: Faces Faces Pain Scale: Hurts little more Pain Location: BLEs and BUEs, neck Pain Descriptors / Indicators: Discomfort;Grimacing;Guarding Pain Intervention(s): Monitored during session;Repositioned;Limited activity within patient's tolerance    Home Living Family/patient expects to be discharged to:: Assisted living               Home Equipment: Walker - 4 wheels;Wheelchair - manual Additional Comments: LTC resident at  [facility]    Prior Function Level of Independence: Needs assistance   Gait / Transfers Assistance Needed: Pt had been transitioning to using W/C more.  ADL's / Homemaking Assistance Needed: Assist for bathing/shower.  ALF provides meals.        Hand Dominance   Dominant Hand: Right    Extremity/Trunk Assessment   Upper Extremity Assessment Upper Extremity Assessment: Defer to OT evaluation RUE Deficits / Details: Guarding  with shoulder/elbow movements and poor grip strength RUE Coordination: decreased fine motor;decreased gross motor LUE Deficits / Details: Guarding with shoulder/elbow movements and poor grip strength LUE Coordination: decreased fine motor;decreased gross motor    Lower Extremity Assessment Lower Extremity Assessment: Generalized weakness;RLE deficits/detail RLE Deficits / Details: Decr aROM and strength, limited by pain postop    Cervical / Trunk Assessment Cervical / Trunk Assessment: Other exceptions Cervical / Trunk Exceptions: s/p cervical fx in C-collar  Communication   Communication: HOH  Cognition Arousal/Alertness: Lethargic Behavior During Therapy: Flat affect Overall Cognitive Status: Within Functional Limits for tasks assessed                                 General Comments: Very HOH and requiring multiple verbal cues to arouse, but pt able to follow commands      General Comments General comments (skin integrity, edema, etc.): SpO2 87% on 3L O2 [place] at beginning of session; pt sitting upright at EOB on 4L O2 at 95% O2 in [place] and pt retured to chair position for O2 down to 3L [place] and satting at 95% O2.    Exercises Other Exercises Other Exercises: elbow wrist  and hand for loosening up pre sitting balance   Assessment/Plan    PT Assessment Patient needs continued PT services  PT Problem List Decreased strength;Decreased range of motion;Decreased activity tolerance;Decreased balance;Decreased mobility;Decreased coordination;Decreased cognition;Decreased knowledge of use of DME;Decreased safety awareness;Decreased knowledge of precautions;Cardiopulmonary status limiting activity;Obesity;Pain       PT Treatment Interventions DME instruction;Gait training;Functional mobility training;Therapeutic activities;Therapeutic exercise;Balance training;Neuromuscular re-education;Cognitive remediation;Patient/family education;Wheelchair mobility training    PT Goals  (Current goals can be found in the Care Plan section)  Acute Rehab PT Goals Patient Stated Goal: son wants a SNF in [place]  PT Goal Formulation: With patient/family Time For Goal Achievement: 07/07/19 Potential to Achieve Goals: Fair    Frequency Min 2X/week   Barriers to discharge        Co-evaluation               AM-PAC PT "6 Clicks" Mobility  Outcome Measure Help needed turning from your back to your side while in a flat bed without using bedrails?: Total Help needed moving from lying on your back to sitting on the side of a flat bed without using bedrails?: Total Help needed moving to and from a bed to a chair (including a wheelchair)?: Total Help needed standing up from a chair using your arms (e.g., wheelchair or bedside chair)?: Total Help needed to walk in hospital room?: Total Help needed climbing 3-5 steps with a railing? : Total 6 Click Score: 6    End of Session Equipment Utilized During Treatment: Cervical collar Activity Tolerance: Patient limited by pain;Patient limited by fatigue;Other (comment)(REcieving blood) Patient left: in bed;with call bell/phone within reach;with bed alarm set;Other (comment)(bed in semi-chair position) Nurse Communication: Mobility status PT Visit Diagnosis: Other abnormalities of gait and mobility (R26.89);Muscle weakness (generalized) (M62.81);Pain;History of falling (  Z91.81) Pain - Right/Left: Right Pain - part of body: Leg(and neck soreness)    Time: [id number] PT Time Calculation (min) (ACUTE ONLY): 33 min   Charges:   PT Evaluation $PT Eval Moderate Complexity: 1 Mod          Roney Marion, PT  Acute Rehabilitation Services Pager [phone] Office [phone]   Colletta [place] 06/23/2019, 6:32 PM

## 2019-06-23 NOTE — Progress Notes (Signed)
OT Cancellation Note  Patient Details Name: Cindy Graham MRN: [id number] DOB: 07/14/1921   Cancelled Treatment:    Reason Eval/Treat Not Completed: Medical issues which prohibited therapy(Pt with low Hgb and awaiting blood infusions to complete.)  OTR to continue to follow for OT eval for next available treatment time.  Jefferey Pica OTR/L Acute Rehabilitation Services Pager: [phone] Office: [phone]   [name] 06/23/2019, 1:35 PM

## 2019-06-23 NOTE — Evaluation (Signed)
Occupational Therapy Evaluation Patient Details Name: Cindy Graham MRN: [id number] DOB: [birth year] Today's Date: 06/23/2019    History of Present Illness Cindy Graham is a 86 y.o. female with medical history significant for HTN,  who presents with right hip fracture s/p mechanical fall at assisted living facility.  Had a episode of fall while transferring from bed to wheelchair, and hit her head but denies LOC.  Denies fever/chills/nausea/vomiting. Walks with assistive devices at baseline (walker, cane, wheelchair). CT showing posterior arch of C1 and right C1 fracture, and right intertrochanteric femur fracture. Aspen collar at all times; now s/p Open treatment of intertrochanteric fracture with intramedullary implant, WBAT   Clinical Impression   Pt PTA: living in ALF and was mostly using w/c for mobility, HOH at baseline and requiring assist with ADL. Pt's son in room to confirm details. Pt currently limited by pain, HOH, decreased arousal, decreased strength and inability to care for self. Pt totalA +2 for supine to sit helicopter approach with no active initiation of movement from pt. Pt grimacing with movement, but once settled, pt calm. Pt maxA to totalA for sitting balance. Pt with decreased use of BUEs and unable to initiate movement to perform grooming even with gravity eliminated. Pt would benefit from continued OT skilled services. OT following acutely. (OTR starting to go over cervical precautions, but pt's c-collar in good positioning and pt unable to attend long enough for education)  ** son wants SNF in [place].      Follow Up Recommendations  SNF;Supervision/Assistance - 24 hour    Equipment Recommendations  Other (comment)(to be determined at next venue)    Recommendations for Other Services       Precautions / Restrictions Precautions Precautions: Fall;Cervical;Other (comment) Precaution Booklet Issued: No Precaution Comments: verbal discussion of cervical  precautions; watch O2 Required Braces or Orthoses: Cervical Brace Restrictions Weight Bearing Restrictions: Yes RLE Weight Bearing: Weight bearing as tolerated      Mobility Bed Mobility Overal bed mobility: Needs Assistance Bed Mobility: Supine to Sit;Sit to Supine     Supine to sit: Total assist;+2 for physical assistance Sit to supine: Total assist;+2 for physical assistance   General bed mobility comments: Pt totalA +2 for bed mobility; no initiation of movement noted  Transfers                 General transfer comment: deferred as pt maxA to totalA at EOB    Balance Overall balance assessment: Needs assistance Sitting-balance support: Bilateral upper extremity supported;Feet supported Sitting balance-Leahy Scale: Zero Sitting balance - Comments: maxA to totalA at EOB for static sitting                                   ADL either performed or assessed with clinical judgement   ADL Overall ADL's : Needs assistance/impaired Eating/Feeding: Total assistance Eating/Feeding Details (indicate cue type and reason): Spoon feeding based on arousal and eventually pt able to suck from straw Grooming: Total assistance Grooming Details (indicate cue type and reason): pt unable to grip washcloth with either hand when moving arm up to face; pt unable to perform any wiping motion Upper Body Bathing: Total assistance;Bed level   Lower Body Bathing: Total assistance;+2 for physical assistance;+2 for safety/equipment;Sitting/lateral leans   Upper Body Dressing : Total assistance;Sitting;Bed level   Lower Body Dressing: Total assistance;+2 for physical assistance;+2 for safety/equipment;Sitting/lateral leans;Bed level   Toilet Transfer: Total  assistance;+2 for physical assistance;+2 for safety/equipment Toilet Transfer Details (indicate cue type and reason): staff advised to use lift Toileting- Clothing Manipulation and Hygiene: Total assistance;Bed  level Toileting - Clothing Manipulation Details (indicate cue type and reason): purewick in place     Functional mobility during ADLs: Total assistance;+2 for physical assistance;+2 for safety/equipment General ADL Comments: Pt limited by pain, HOH, decreased arousal, decreased strength and inability to care for self.     Vision Baseline Vision/History: Wears glasses Wears Glasses: At all times Vision Assessment?: No apparent visual deficits Additional Comments: Not wearing glasses, but usually does     Perception     Praxis      Pertinent Vitals/Pain Pain Assessment: Faces Faces Pain Scale: Hurts little more Pain Location: BLEs and BUEs, neck Pain Descriptors / Indicators: Discomfort;Grimacing;Guarding Pain Intervention(s): Limited activity within patient's tolerance;Monitored during session;RN gave pain meds during session     Hand Dominance Right   Extremity/Trunk Assessment Upper Extremity Assessment Upper Extremity Assessment: Generalized weakness;RUE deficits/detail;LUE deficits/detail RUE Deficits / Details: Guarding with shoulder/elbow movements and poor grip strength RUE Coordination: decreased fine motor;decreased gross motor LUE Deficits / Details: Guarding with shoulder/elbow movements and poor grip strength LUE Coordination: decreased fine motor;decreased gross motor   Lower Extremity Assessment Lower Extremity Assessment: Generalized weakness   Cervical / Trunk Assessment Cervical / Trunk Assessment: Other exceptions Cervical / Trunk Exceptions: s/p cervical fx in C-collar   Communication Communication Communication: HOH   Cognition Arousal/Alertness: Lethargic Behavior During Therapy: Flat affect Overall Cognitive Status: Within Functional Limits for tasks assessed                                 General Comments: Very HOH and requiring multiple verbal cues to arouse, but pt able to follow commands   General Comments  SpO2 87% on 3L O2  [place] at beginning of session; pt sitting upright at EOB on 4L O2 at 95% O2 in [place] and pt retured to chair position for O2 down to 3L [place] and satting at 95% O2.    Exercises Exercises: Other exercises Other Exercises Other Exercises: elbow wrist  and hand for loosening up pre sitting balance   Shoulder Instructions      Home Living Family/patient expects to be discharged to:: Assisted living                             Home Equipment: Walker - 4 wheels;Wheelchair - manual   Additional Comments: LTC resident at [facility]      Prior Functioning/Environment Level of Independence: Needs assistance  Gait / Transfers Assistance Needed: Pt had been transitioning to using W/C more. ADL's / Homemaking Assistance Needed: Assist for bathing/shower.  ALF provides meals.            OT Problem List: Decreased strength;Decreased activity tolerance;Impaired balance (sitting and/or standing);Decreased coordination;Decreased safety awareness;Impaired UE functional use;Pain;Increased edema;Decreased range of motion      OT Treatment/Interventions: Self-care/ADL training;Therapeutic exercise;Energy conservation;DME and/or AE instruction;Therapeutic activities;Patient/family education;Balance training    OT Goals(Current goals can be found in the care plan section) Acute Rehab OT Goals Patient Stated Goal: son wants a SNF in [place]  OT Goal Formulation: With patient/family Time For Goal Achievement: 07/07/19 Potential to Achieve Goals: Good ADL Goals Pt Will Perform Eating: with min guard assist;sitting Pt Will Perform Grooming: with min guard assist;sitting Pt Will Perform  Upper Body Dressing: with min assist;sitting Pt Will Transfer to Toilet: with max assist;squat pivot transfer;bedside commode  OT Frequency: Min 2X/week   Barriers to D/C:            Co-evaluation              AM-PAC OT "6 Clicks" Daily Activity     Outcome Measure Help from another person  eating meals?: Total Help from another person taking care of personal grooming?: Total Help from another person toileting, which includes using toliet, bedpan, or urinal?: Total Help from another person bathing (including washing, rinsing, drying)?: Total Help from another person to put on and taking off regular upper body clothing?: Total Help from another person to put on and taking off regular lower body clothing?: Total 6 Click Score: 6   End of Session Equipment Utilized During Treatment: Cervical collar;Oxygen Nurse Communication: Mobility status  Activity Tolerance: Patient limited by pain;Patient limited by lethargy Patient left: in bed;with call bell/phone within reach;with bed alarm set;with family/visitor present;Other (comment)(bed in chair position)  OT Visit Diagnosis: Muscle weakness (generalized) (M62.81);Pain;Unsteadiness on feet (R26.81) Pain - Right/Left: Right Pain - part of body: Leg                Time: [id number] OT Time Calculation (min): 32 min Charges:  OT General Charges $OT Visit: 1 Visit OT Evaluation $OT Eval Moderate Complexity: [place] OTR/L Acute Rehabilitation Services Pager: [phone] Office: [phone]   Lathaniel Legate C 06/23/2019, 3:40 PM

## 2019-06-23 NOTE — Progress Notes (Signed)
PT Cancellation Note  Patient Details Name: Cindy Graham MRN: [id number] DOB: [birth year]   Cancelled Treatment:    Reason Eval/Treat Not Completed: Active bedrest order   Will need increased activity orders to proceed with PT Mobility evaluation;   Roney Marion, [place] Pager [phone] Office [place] 06/23/2019, 7:23 AM

## 2019-06-23 NOTE — Progress Notes (Signed)
PROGRESS NOTE    Cindy Graham  [id number] DOB: 07-03-1921 DOA: 06/21/2019 PCP: Orvis Brill, [facility]   Brief Narrative: 86 year old with past medical history significant for hypertension who presented after a fall at her assisted living facility.  Patient fell while transferring from bed to wheelchair.  She also hit her head but denies LOC.    Evaluation in the ED CT showed;  Acute fractures of the posterior arch of C1 and right C1 and C2 transverse processes and transverse foramina. There is distraction of the right C1-C2 articulation.and right intertrochanteric femur fracture   Assessment & Plan:   Principal Problem:   Displaced intertrochanteric fracture of right femur, initial encounter for closed fracture [facility])   1-Right intertrochanteric femur fracture: Patient underwent open treatment of intertrochanteric fracture with intramedullary implant on 06/22/2019 by Dr. Erlinda Hong Patient seen post surgery, alert, answering some questions. Pain management and DVT prophylaxis per Ortho. On Lovenox.  Bowel regimen  2-Acute fracture of the posterior arch of C1 and and right C1 and C2 transverse process and transverse foramina  Fracture. -Patient evaluated by neurosurgery.  They recommended a CT angio neck that showed  Suspected focal right vertebral traumatic injury at the C2 level. with tiny pseudoaneurysm. -Neurosurgery recommend conservative treatment with Asper collar  immobilization, patient can be on DVT prophylaxis for post op  hip fracture.  Also aspirin 325 for vert injury . Change dose to 325 mg -She will need a repeat CTA in 4 to 6 weeks to evaluate RVA dissection/pseudoaneurysm.  3-Hypertension: Continue with verapamil, Inderal.  Hold lisinopril now that creatinine has increased to 1.3  COPD: On Pulmicort..  Leukocytosis: Suspect related to demargination.  Follow trend. Trending down.   AKI: Continue with IV fluids.  Hold ACE.  Acute blood loss anemia: Post surgery  expected follow trend. Plan to transfuse two unit, lasix in between.   Mild fever; incentive spirometry     Nutrition Problem: Increased nutrient needs Etiology: post-op healing(COPD)    Signs/Symptoms: estimated needs       Estimated body mass index is 26.56 kg/m as calculated from the following:   Height as of this encounter: 5\' 3"  (1.6 m).   Weight as of this encounter: 68 kg.   DVT prophylaxis: Lovenox Code Status: DNR Family Communication: Son over Phone Disposition Plan:  Patient from assisted living facility Suspect that she will require skilled nursing facility She just had surgery today 1/30 for hip fracture. Consultants:   Dr. Erlinda Hong with orthopedic  NeuroSurgery  Procedures:  Patient underwent open treatment of intertrochanteric fracture with intramedullary implant on 06/22/2019  Antimicrobials:  None  Subjective: Alert, awaiting for breakfast  Mild hip pain  Denies dyspnea Objective: Vitals:   06/23/19 0013 06/23/19 0020 06/23/19 0034 06/23/19 0303  BP: (!) 95/38 (!) 102/48 (!) 110/58 (!) 105/38  Pulse: 97   96  Resp:    16  Temp: 99.3 F (37.4 C)   98.9 F (37.2 C)  TempSrc: Oral   Oral  SpO2: 90% 92%  96%  Weight:      Height:        Intake/Output Summary (Last 24 hours) at 06/23/2019 0710 Last data filed at 06/23/2019 0200 Gross per 24 hour  Intake 2689.58 ml  Output 450 ml  Net 2239.58 ml   Filed Weights   06/21/19 1744 06/21/19 2200  Weight: 68.3 kg 68 kg    Examination:  General exam: NAD Respiratory system: CTA Cardiovascular system:  S 1, S 2 RRR Gastrointestinal  system: BS present, soft, nt Central nervous system: alert Extremities: Right hip with dressing, small amount dry blood .  Skin: no rashes    Data Reviewed: I have personally reviewed following labs and imaging studies  CBC: Recent Labs  Lab 06/21/19 1059 06/22/19 0452 06/22/19 1120  WBC 8.1 11.5* 16.8*  NEUTROABS 4.1 7.6  --   HGB 13.5 12.0 10.9*    HCT 42.6 37.5 34.9*  MCV 95.7 96.6 98.9  PLT 262 243 [id number]   Basic Metabolic Panel: Recent Labs  Lab 06/21/19 1059 06/22/19 1120  NA 139  --   K 4.1  --   CL 100  --   CO2 29  --   GLUCOSE 109*  --   BUN 23  --   CREATININE 0.96 1.38*  CALCIUM 9.5  --    GFR: Estimated Creatinine Clearance: 22.6 mL/min (A) (by C-G formula based on SCr of 1.38 mg/dL (H)). Liver Function Tests: No results for input(s): AST, ALT, ALKPHOS, BILITOT, PROT, ALBUMIN in the last 168 hours. No results for input(s): LIPASE, AMYLASE in the last 168 hours. No results for input(s): AMMONIA in the last 168 hours. Coagulation Profile: Recent Labs  Lab 06/21/19 2001  INR 0.9   Cardiac Enzymes: No results for input(s): CKTOTAL, CKMB, CKMBINDEX, TROPONINI in the last 168 hours. BNP (last 3 results) No results for input(s): PROBNP in the last 8760 hours. HbA1C: No results for input(s): HGBA1C in the last 72 hours. CBG: No results for input(s): GLUCAP in the last 168 hours. Lipid Profile: No results for input(s): CHOL, HDL, LDLCALC, TRIG, CHOLHDL, LDLDIRECT in the last 72 hours. Thyroid Function Tests: No results for input(s): TSH, T4TOTAL, FREET4, T3FREE, THYROIDAB in the last 72 hours. Anemia Panel: No results for input(s): VITAMINB12, FOLATE, FERRITIN, TIBC, IRON, RETICCTPCT in the last 72 hours. Sepsis Labs: No results for input(s): PROCALCITON, LATICACIDVEN in the last 168 hours.  Recent Results (from the past 240 hour(s))  Respiratory Panel by RT PCR (Flu A&B, Covid) - Nasopharyngeal Swab     Status: None   Collection Time: 06/21/19  1:58 PM   Specimen: Nasopharyngeal Swab  Result Value Ref Range Status   SARS Coronavirus 2 by RT PCR NEGATIVE NEGATIVE Final    Comment: (NOTE) SARS-CoV-2 target nucleic acids are NOT DETECTED. The SARS-CoV-2 RNA is generally detectable in upper respiratoy specimens during the acute phase of infection. The lowest concentration of SARS-CoV-2 viral copies this  assay can detect is 131 copies/mL. A negative result does not preclude SARS-Cov-2 infection and should not be used as the sole basis for treatment or other patient management decisions. A negative result may occur with  improper specimen collection/handling, submission of specimen other than nasopharyngeal swab, presence of viral mutation(s) within the areas targeted by this assay, and inadequate number of viral copies (<131 copies/mL). A negative result must be combined with clinical observations, patient history, and epidemiological information. The expected result is Negative. Fact Sheet for Patients:  PinkCheek.be Fact Sheet for Healthcare Providers:  GravelBags.it This test is not yet ap proved or cleared by the [place] FDA and  has been authorized for detection and/or diagnosis of SARS-CoV-2 by FDA under an Emergency Use Authorization (EUA). This EUA will remain  in effect (meaning this test can be used) for the duration of the COVID-19 declaration under Section 564(b)(1) of the Act, 21 U.S.C. section 360bbb-3(b)(1), unless the authorization is terminated or revoked sooner.    Influenza A by PCR NEGATIVE NEGATIVE Final  Influenza B by PCR NEGATIVE NEGATIVE Final    Comment: (NOTE) The Xpert Xpress SARS-CoV-2/FLU/RSV assay is intended as an aid in  the diagnosis of influenza from Nasopharyngeal swab specimens and  should not be used as a sole basis for treatment. Nasal washings and  aspirates are unacceptable for Xpert Xpress SARS-CoV-2/FLU/RSV  testing. Fact Sheet for Patients: PinkCheek.be Fact Sheet for Healthcare Providers: GravelBags.it This test is not yet approved or cleared by the [place] FDA and  has been authorized for detection and/or diagnosis of SARS-CoV-2 by  FDA under an Emergency Use Authorization (EUA). This EUA will remain  in  effect (meaning this test can be used) for the duration of the  Covid-19 declaration under Section 564(b)(1) of the Act, 21  U.S.C. section 360bbb-3(b)(1), unless the authorization is  terminated or revoked. Performed at [facility], [address] [place], [place] [address]   Surgical pcr screen     Status: None   Collection Time: 06/21/19  7:57 PM   Specimen: Nasal Mucosa; Nasal Swab  Result Value Ref Range Status   MRSA, PCR NEGATIVE NEGATIVE Final   Staphylococcus aureus NEGATIVE NEGATIVE Final    Comment: (NOTE) The Xpert SA Assay (FDA approved for NASAL specimens in patients 65 years of age and older), is one component of a comprehensive surveillance program. It is not intended to diagnose infection nor to guide or monitor treatment. Performed at [facility], [place] [address] [place], [place] [address]          Radiology Studies: DG Chest 1 View  Result Date: 06/21/2019 CLINICAL DATA:  Fall.  Right hip fracture. EXAM: CHEST  1 VIEW COMPARISON:  Chest x-ray dated February 27, 2019. FINDINGS: The heart size and mediastinal contours are within normal limits. Normal pulmonary vascularity. No focal consolidation, pleural effusion, or pneumothorax. Unchanged elevation of the right hemidiaphragm. No acute osseous abnormality. IMPRESSION: No active disease. Electronically Signed   By: Titus Dubin M.D.   On: 06/21/2019 12:54   DG Knee 2 Views Right  Result Date: 06/21/2019 CLINICAL DATA:  Right knee pain after fall. EXAM: RIGHT KNEE - 1-2 VIEW COMPARISON:  None. FINDINGS: No acute fracture or dislocation. No large joint effusion, although evaluation is limited due to suboptimal positioning on the cross-table lateral view. Moderate to severe medial compartment joint space narrowing. Medial and lateral compartment marginal osteophytes. Osteopenia. Vascular calcifications. IMPRESSION: 1.  No acute osseous abnormality. 2. Moderate to severe medial compartment  osteoarthritis. Electronically Signed   By: Titus Dubin M.D.   On: 06/21/2019 12:52   CT Head Wo Contrast  Result Date: 06/21/2019 CLINICAL DATA:  Head trauma EXAM: CT HEAD WITHOUT CONTRAST TECHNIQUE: Contiguous axial images were obtained from the base of the skull through the vertex without intravenous contrast. COMPARISON:  04/14/2019 FINDINGS: Brain: There is no acute intracranial hemorrhage, mass-effect, or edema. Gray-white differentiation is preserved. There is no extra-axial fluid collection. Stable left frontal convexity calcified meningioma. Ventricles and sulci are stable in size and configuration. Patchy hypoattenuation in the supratentorial white matter is nonspecific but likely reflects stable chronic microvascular ischemic changes. Vascular: There is atherosclerotic calcification at the skull base. Skull: Right frontal calvarium osteoma.  Otherwise unremarkable. Sinuses/Orbits: No acute finding. Other: None. IMPRESSION: No evidence of acute intracranial injury. Stable chronic findings detailed above. Electronically Signed   By: Macy Mis M.D.   On: 06/21/2019 12:24   CT Angio Neck W and/or Wo Contrast  Result Date: 06/21/2019 CLINICAL DATA:  Cervical spine fracture EXAM: CT ANGIOGRAPHY NECK TECHNIQUE: Multidetector CT imaging of the neck was performed using the standard protocol during bolus administration of intravenous contrast. Multiplanar CT image reconstructions and MIPs were obtained to evaluate the vascular anatomy. Carotid stenosis measurements (when applicable) are obtained utilizing NASCET criteria, using the distal internal carotid diameter as the denominator. CONTRAST:  85mL OMNIPAQUE IOHEXOL 350 MG/ML SOLN COMPARISON:  None. FINDINGS: Aortic arch: Calcified plaque along the arch and great vessel origins, which are patent. Right carotid system: Patent. No measurable stenosis at the ICA origin. Left carotid system: Patent. Mild calcified plaque at the bifurcation. No  measurable stenosis of the ICA origin. Vertebral arteries: Extracranial vertebral arteries are patent. There is mild luminal irregularity of the right vertebral artery at the C2 level in proximity to fractures. Suspected sub 2 mm superiorly directed pseudoaneurysm (best seen on series 7, image 123). Skeleton: Cervical spine fractures as previously described. Other neck: No mass or adenopathy. Upper chest: Included upper lungs are clear. IMPRESSION: Suspected focal right vertebral traumatic injury at the C2 level with tiny pseudoaneurysm. Electronically Signed   By: Macy Mis M.D.   On: 06/21/2019 15:28   CT Cervical Spine Wo Contrast  Result Date: 06/21/2019 CLINICAL DATA:  Fall EXAM: CT CERVICAL SPINE WITHOUT CONTRAST TECHNIQUE: Multidetector CT imaging of the cervical spine was performed without intravenous contrast. Multiplanar CT image reconstructions were also generated. COMPARISON:  2019 FINDINGS: Alignment: Stable. Skull base and vertebrae: Acute nondisplaced fracture through the posterior arch of C1 on the left. There is distraction at the right lateral mass C1-C2 articulation. Acute fractures are present through the right posterior arch of C1 as well as the right C1 transverse foramen. Comminuted acute fractures through the right transverse process and transverse foramen of C2. Vertebral body heights are preserved. Soft tissues and spinal canal: Stable retrodental likely degenerative soft tissue. Disc levels: Insert degenerative. There is no high-grade osseous encroachment on the spinal canal. Upper chest: No acute abnormality. Other: None. IMPRESSION: Acute fractures of the posterior arch of C1 and right C1 and C2 transverse processes and transverse foramina. There is distraction of the right C1-C2 articulation. Vascular imaging recommended to exclude vertebral artery injury. Electronically Signed   By: Macy Mis M.D.   On: 06/21/2019 12:40   DG C-Arm 1-60 Min  Result Date:  06/22/2019 CLINICAL DATA:  ORIF right hip fracture. EXAM: OPERATIVE RIGHT HIP (WITH PELVIS IF PERFORMED) 6 VIEWS TECHNIQUE: Fluoroscopic spot image(s) were submitted for interpretation post-operatively. COMPARISON:  Right hip radiographs-06/21/2019 FINDINGS: 6 spot intraoperative fluoroscopic images of the right hip are provided for review. Images demonstrate the sequela intramedullary rod fixation of the right femur and dynamic screw fixation of the right femoral neck transfixing the known comminuted intertrochanteric femur fracture. Alignment appears near anatomic. The distal end of the intramedullary rod is transfixed with a single cancellous screw. No evidence of hardware failure or loosening. There is a minimal amount of expected subcutaneous emphysema about the operative site. No radiopaque body. IMPRESSION: Post uncomplicated ORIF of intertrochanteric femur fracture. Electronically Signed   By: Sandi Mariscal M.D.   On: 06/22/2019 10:01   DG HIP OPERATIVE UNILAT W OR W/O PELVIS RIGHT  Result Date: 06/22/2019 CLINICAL DATA:  ORIF right hip fracture. EXAM: OPERATIVE RIGHT HIP (WITH PELVIS IF PERFORMED) 6 VIEWS TECHNIQUE: Fluoroscopic spot image(s) were submitted for interpretation post-operatively. COMPARISON:  Right hip radiographs-06/21/2019 FINDINGS: 6 spot intraoperative fluoroscopic images of the right hip are provided for review. Images demonstrate the  sequela intramedullary rod fixation of the right femur and dynamic screw fixation of the right femoral neck transfixing the known comminuted intertrochanteric femur fracture. Alignment appears near anatomic. The distal end of the intramedullary rod is transfixed with a single cancellous screw. No evidence of hardware failure or loosening. There is a minimal amount of expected subcutaneous emphysema about the operative site. No radiopaque body. IMPRESSION: Post uncomplicated ORIF of intertrochanteric femur fracture. Electronically Signed   By: Sandi Mariscal  M.D.   On: 06/22/2019 10:01   DG Hip Unilat W or Wo Pelvis 2-3 Views Right  Result Date: 06/21/2019 CLINICAL DATA:  Right hip pain after fall. EXAM: DG HIP (WITH OR WITHOUT PELVIS) 2-3V RIGHT COMPARISON:  November 03, 2017. FINDINGS: Acute angulated right intertrochanteric femur fracture. No dislocation. The pubic symphysis and sacroiliac joints are intact. Osteopenia. IMPRESSION: Acute angulated right intertrochanteric femur fracture. Electronically Signed   By: Titus Dubin M.D.   On: 06/21/2019 12:50        Scheduled Meds: . acetaminophen  500 mg Oral Q6H  . ascorbic acid  500 mg Oral BID  . aspirin EC  81 mg Oral Daily  . budesonide  0.25 mg Nebulization BID  . docusate sodium  100 mg Oral BID  . enoxaparin (LOVENOX) injection  40 mg Subcutaneous Q24H  . feeding supplement (ENSURE ENLIVE)  237 mL Oral BID BM  . gabapentin  300 mg Oral QHS  . multivitamin with minerals  1 tablet Oral Daily  . oxybutynin  5 mg Oral Daily  . pantoprazole  40 mg Oral Daily  . propranolol  60 mg Oral Daily  . sertraline  100 mg Oral Daily  . tapentadol  50 mg Oral TID  . verapamil  240 mg Oral QHS   Continuous Infusions: . sodium chloride 75 mL/hr at 06/23/19 0556     LOS: 2 days    Time spent: 35 minutes.     Elmarie Shiley, MD [facility]   If 7PM-7AM, please contact night-coverage  06/23/2019, 7:10 AM

## 2019-06-24 ENCOUNTER — Encounter: Payer: Self-pay | Admitting: *Deleted

## 2019-06-24 LAB — BPAM RBC
Blood Product Expiration Date: 202102242359
Blood Product Expiration Date: 202102242359
ISSUE DATE / TIME: 202101311005
ISSUE DATE / TIME: 202101311430
Unit Type and Rh: 5100
Unit Type and Rh: 5100

## 2019-06-24 LAB — CBC
HCT: 31 % — ABNORMAL LOW (ref 36.0–46.0)
Hemoglobin: 10.2 g/dL — ABNORMAL LOW (ref 12.0–15.0)
MCH: 31.6 pg (ref 26.0–34.0)
MCHC: 32.9 g/dL (ref 30.0–36.0)
MCV: 96 fL (ref 80.0–100.0)
Platelets: 152 10*3/uL (ref 150–400)
RBC: 3.23 MIL/uL — ABNORMAL LOW (ref 3.87–5.11)
RDW: 15.8 % — ABNORMAL HIGH (ref 11.5–15.5)
WBC: 12 10*3/uL — ABNORMAL HIGH (ref 4.0–10.5)
nRBC: 0 % (ref 0.0–0.2)

## 2019-06-24 LAB — TYPE AND SCREEN
ABO/RH(D): O POS
Antibody Screen: NEGATIVE
Unit division: 0
Unit division: 0

## 2019-06-24 LAB — BASIC METABOLIC PANEL
Anion gap: 6 (ref 5–15)
BUN: 40 mg/dL — ABNORMAL HIGH (ref 8–23)
CO2: 26 mmol/L (ref 22–32)
Calcium: 7.9 mg/dL — ABNORMAL LOW (ref 8.9–10.3)
Chloride: 107 mmol/L (ref 98–111)
Creatinine, Ser: 1.43 mg/dL — ABNORMAL HIGH (ref 0.44–1.00)
GFR calc Af Amer: 36 mL/min — ABNORMAL LOW (ref >60)
GFR calc non Af Amer: 31 mL/min — ABNORMAL LOW (ref >60)
Glucose, Bld: 104 mg/dL — ABNORMAL HIGH (ref 70–99)
Potassium: 4 mmol/L (ref 3.5–5.1)
Sodium: 139 mmol/L (ref 135–145)

## 2019-06-24 MED ORDER — SODIUM CHLORIDE 0.9 % IV SOLN: INTRAVENOUS | Status: DC

## 2019-06-24 MED ORDER — HYDROCORTISONE 0.5 % EX CREA
TOPICAL_CREAM | Freq: Two times a day (BID) | CUTANEOUS | Status: DC
  Filled 2019-06-24: qty 28.35

## 2019-06-24 MED ORDER — POLYETHYLENE GLYCOL 3350 17 G PO PACK
17.0000 g | PACK | Freq: Every day | ORAL | Status: DC
  Administered 2019-06-24 – 2019-06-26 (×3): 17 g via ORAL
  Filled 2019-06-24 (×3): qty 1

## 2019-06-24 MED ORDER — HYDROCORTISONE 1 % EX CREA
TOPICAL_CREAM | Freq: Two times a day (BID) | CUTANEOUS | Status: DC
  Filled 2019-06-24: qty 28

## 2019-06-24 NOTE — Progress Notes (Signed)
PROGRESS NOTE    Cindy Graham  [id number] DOB: [birth year] DOA: 06/21/2019 PCP: Orvis Brill, [facility]   Brief Narrative: 86 year old with past medical history significant for hypertension who presented after a fall at her assisted living facility.  Patient fell while transferring from bed to wheelchair.  She also hit her head but denies LOC.    Evaluation in the ED CT showed;  Acute fractures of the posterior arch of C1 and right C1 and C2 transverse processes and transverse foramina. There is distraction of the right C1-C2 articulation.and right intertrochanteric femur fracture   Assessment & Plan:   Principal Problem:   Displaced intertrochanteric fracture of right femur, initial encounter for closed fracture [facility])   1-Right intertrochanteric femur fracture: Patient underwent open treatment of intertrochanteric fracture with intramedullary implant on 06/22/2019 by Dr. Erlinda Hong Pain management and DVT prophylaxis per Ortho. On Lovenox.  Bowel regimen.  2-Acute fracture of the posterior arch of C1 and and right C1 and C2 transverse process and transverse foramina  Fracture. -Patient evaluated by neurosurgery.  They recommended a CT angio neck that showed  Suspected focal right vertebral traumatic injury at the C2 level. with tiny pseudoaneurysm. -Neurosurgery recommend conservative treatment with Asper collar  immobilization, patient can be on DVT prophylaxis for post op  hip fracture.  Also aspirin 325 for vert injury . Change dose to 325 mg -She will need a repeat CTA in 4 to 6 weeks to evaluate RVA dissection/pseudoaneurysm.  3-Hypertension: Continue with verapamil, Inderal.  Hold lisinopril now that creatinine has increased to 1.3  COPD: On Pulmicort..  Leukocytosis: Suspect related to demargination.  Follow trend. Trending down.   AKI:   Hold ACE. Increase cr, resume IV fluids.   Acute blood loss anemia: Post surgery expected follow trend. Received 2 units PRBC  06-23-2019  Mild fever; incentive spirometry  Rash; will order steroid lotion   Nutrition Problem: Increased nutrient needs Etiology: post-op healing(COPD)    Signs/Symptoms: estimated needs       Estimated body mass index is 26.56 kg/m as calculated from the following:   Height as of this encounter: 5\' 3"  (1.6 m).   Weight as of this encounter: 68 kg.   DVT prophylaxis: Lovenox Code Status: DNR Family Communication: Son over Phone Disposition Plan:  Patient from assisted living facility. Suspect that she will require skilled nursing facility. Barrier to d/d; renal function worse, needs to resume IV fluids. Discharge 06-25-2019 if renal function improved. Also awaiting PT eval today.   Consultants:   Dr. Erlinda Hong with orthopedic  NeuroSurgery  Procedures:  Patient underwent open treatment of intertrochanteric fracture with intramedullary implant on 06/22/2019  Antimicrobials:  None  Subjective: Denies dyspnea.  hip pain is controlled.   Objective: Vitals:   06/24/19 0432 06/24/19 0831 06/24/19 0832 06/24/19 1008  BP: (!) 108/47  (!) 123/52 (!) 137/54  Pulse: 75  76 85  Resp: 18  14 17   Temp: 98.8 F (37.1 C)  98.3 F (36.8 C) 98 F (36.7 C)  TempSrc: Oral  Oral Oral  SpO2: 90% 93% 94% 96%  Weight:      Height:        Intake/Output Summary (Last 24 hours) at 06/24/2019 1421 Last data filed at 06/24/2019 0900 Gross per 24 hour  Intake 360 ml  Output 750 ml  Net -390 ml   Filed Weights   06/21/19 1744 06/21/19 2200  Weight: 68.3 kg 68 kg    Examination:  General exam: NAD Respiratory system:  CTA Cardiovascular system:  S 1, S 2 RRR Gastrointestinal system: BS present, soft, nt Central nervous system: Alert follows command Extremities: Right hip with dressing Skin: Mild rash forearm    Data Reviewed: I have personally reviewed following labs and imaging studies  CBC: Recent Labs  Lab 06/21/19 1059 06/22/19 0452 06/22/19 1120 06/23/19 0631  06/24/19 0244  WBC 8.1 11.5* 16.8* 11.8* 12.0*  NEUTROABS 4.1 7.6  --   --   --   HGB 13.5 12.0 10.9* 7.6* 10.2*  HCT 42.6 37.5 34.9* 24.6* 31.0*  MCV 95.7 96.6 98.9 100.8* 96.0  PLT 262 243 189 159 0000000   Basic Metabolic Panel: Recent Labs  Lab 06/21/19 1059 06/22/19 1120 06/23/19 0631 06/24/19 0244  NA 139  --  139 139  K 4.1  --  4.0 4.0  CL 100  --  105 107  CO2 29  --  23 26  GLUCOSE 109*  --  128* 104*  BUN 23  --  31* 40*  CREATININE 0.96 1.38* 1.38* 1.43*  CALCIUM 9.5  --  7.6* 7.9*   GFR: Estimated Creatinine Clearance: 21.8 mL/min (A) (by C-G formula based on SCr of 1.43 mg/dL (H)). Liver Function Tests: No results for input(s): AST, ALT, ALKPHOS, BILITOT, PROT, ALBUMIN in the last 168 hours. No results for input(s): LIPASE, AMYLASE in the last 168 hours. No results for input(s): AMMONIA in the last 168 hours. Coagulation Profile: Recent Labs  Lab 06/21/19 2001  INR 0.9   Cardiac Enzymes: No results for input(s): CKTOTAL, CKMB, CKMBINDEX, TROPONINI in the last 168 hours. BNP (last 3 results) No results for input(s): PROBNP in the last 8760 hours. HbA1C: No results for input(s): HGBA1C in the last 72 hours. CBG: No results for input(s): GLUCAP in the last 168 hours. Lipid Profile: No results for input(s): CHOL, HDL, LDLCALC, TRIG, CHOLHDL, LDLDIRECT in the last 72 hours. Thyroid Function Tests: No results for input(s): TSH, T4TOTAL, FREET4, T3FREE, THYROIDAB in the last 72 hours. Anemia Panel: No results for input(s): VITAMINB12, FOLATE, FERRITIN, TIBC, IRON, RETICCTPCT in the last 72 hours. Sepsis Labs: No results for input(s): PROCALCITON, LATICACIDVEN in the last 168 hours.  Recent Results (from the past 240 hour(s))  Respiratory Panel by RT PCR (Flu A&B, Covid) - Nasopharyngeal Swab     Status: None   Collection Time: 06/21/19  1:58 PM   Specimen: Nasopharyngeal Swab  Result Value Ref Range Status   SARS Coronavirus 2 by RT PCR NEGATIVE NEGATIVE  Final    Comment: (NOTE) SARS-CoV-2 target nucleic acids are NOT DETECTED. The SARS-CoV-2 RNA is generally detectable in upper respiratoy specimens during the acute phase of infection. The lowest concentration of SARS-CoV-2 viral copies this assay can detect is 131 copies/mL. A negative result does not preclude SARS-Cov-2 infection and should not be used as the sole basis for treatment or other patient management decisions. A negative result may occur with  improper specimen collection/handling, submission of specimen other than nasopharyngeal swab, presence of viral mutation(s) within the areas targeted by this assay, and inadequate number of viral copies (<131 copies/mL). A negative result must be combined with clinical observations, patient history, and epidemiological information. The expected result is Negative. Fact Sheet for Patients:  PinkCheek.be Fact Sheet for Healthcare Providers:  GravelBags.it This test is not yet ap proved or cleared by the [place] FDA and  has been authorized for detection and/or diagnosis of SARS-CoV-2 by FDA under an Emergency Use Authorization (EUA). This EUA  will remain  in effect (meaning this test can be used) for the duration of the COVID-19 declaration under Section 564(b)(1) of the Act, 21 U.S.C. section 360bbb-3(b)(1), unless the authorization is terminated or revoked sooner.    Influenza A by PCR NEGATIVE NEGATIVE Final   Influenza B by PCR NEGATIVE NEGATIVE Final    Comment: (NOTE) The Xpert Xpress SARS-CoV-2/FLU/RSV assay is intended as an aid in  the diagnosis of influenza from Nasopharyngeal swab specimens and  should not be used as a sole basis for treatment. Nasal washings and  aspirates are unacceptable for Xpert Xpress SARS-CoV-2/FLU/RSV  testing. Fact Sheet for Patients: PinkCheek.be Fact Sheet for Healthcare Providers:  GravelBags.it This test is not yet approved or cleared by the [place] FDA and  has been authorized for detection and/or diagnosis of SARS-CoV-2 by  FDA under an Emergency Use Authorization (EUA). This EUA will remain  in effect (meaning this test can be used) for the duration of the  Covid-19 declaration under Section 564(b)(1) of the Act, 21  U.S.C. section 360bbb-3(b)(1), unless the authorization is  terminated or revoked. Performed at [facility], [address] [place], [place] [address]   Surgical pcr screen     Status: None   Collection Time: 06/21/19  7:57 PM   Specimen: Nasal Mucosa; Nasal Swab  Result Value Ref Range Status   MRSA, PCR NEGATIVE NEGATIVE Final   Staphylococcus aureus NEGATIVE NEGATIVE Final    Comment: (NOTE) The Xpert SA Assay (FDA approved for NASAL specimens in patients 65 years of age and older), is one component of a comprehensive surveillance program. It is not intended to diagnose infection nor to guide or monitor treatment. Performed at [facility], [place] [address] [place], [place] [address]          Radiology Studies: No results found.      Scheduled Meds: . ascorbic acid  500 mg Oral BID  . budesonide  0.25 mg Nebulization BID  . docusate sodium  100 mg Oral BID  . enoxaparin (LOVENOX) injection  40 mg Subcutaneous Q24H  . feeding supplement (ENSURE ENLIVE)  237 mL Oral BID BM  . gabapentin  300 mg Oral QHS  . multivitamin with minerals  1 tablet Oral Daily  . oxybutynin  5 mg Oral Daily  . pantoprazole  40 mg Oral Daily  . polyethylene glycol  17 g Oral Daily  . propranolol  40 mg Oral Daily  . sertraline  100 mg Oral Daily  . tapentadol  50 mg Oral TID  . verapamil  240 mg Oral QHS   Continuous Infusions: . sodium chloride 75 mL/hr at 06/24/19 0953     LOS: 3 days    Time spent: 35 minutes.     Elmarie Shiley, MD [facility]   If 7PM-7AM, please  contact night-coverage  06/24/2019, 2:21 PM

## 2019-06-24 NOTE — Progress Notes (Signed)
Subjective: 2 Days Post-Op Procedure(s) (LRB): INTRAMEDULLARY (IM) NAIL INTERTROCHANTRIC (Right) Patient reports pain as mild.    Objective: Vital signs in last 24 hours: Temp:  [98.7 F (37.1 C)-100.5 F (38.1 C)] 98.8 F (37.1 C) (02/01 0432) Pulse Rate:  [75-105] 75 (02/01 0432) Resp:  [17-18] 18 (02/01 0432) BP: (102-108)/(40-74) 108/47 (02/01 0432) SpO2:  [89 %-98 %] 90 % (02/01 0432)  Intake/Output from previous day: 01/31 0701 - 02/01 0700 In: 360 [P.O.:360] Out: 750 [Urine:750] Intake/Output this shift: Total I/O In: -  Out: 100 [Urine:100]  Recent Labs    06/21/19 1059 06/22/19 0452 06/22/19 1120 06/23/19 0631 06/24/19 0244  HGB 13.5 12.0 10.9* 7.6* 10.2*   Recent Labs    06/23/19 0631 06/24/19 0244  WBC 11.8* 12.0*  RBC 2.44* 3.23*  HCT 24.6* 31.0*  PLT 159 152   Recent Labs    06/23/19 0631 06/24/19 0244  NA 139 139  K 4.0 4.0  CL 105 107  CO2 23 26  BUN 31* 40*  CREATININE 1.38* 1.43*  GLUCOSE 128* 104*  CALCIUM 7.6* 7.9*   Recent Labs    06/21/19 2001  INR 0.9    Neurologically intact Neurovascular intact Sensation intact distally Intact pulses distally Dorsiflexion/Plantar flexion intact Incision: scant drainage No cellulitis present Compartment soft   Assessment/Plan: 2 Days Post-Op Procedure(s) (LRB): INTRAMEDULLARY (IM) NAIL INTERTROCHANTRIC (Right) Up with therapy  WBAT RLE ABLA- has improved  Lovenox 40 x 2 weeks post-op for dvt ppx F/u with Dr. Erlinda Hong 2 weeks post-op for staple removal      Aundra Dubin 06/24/2019, 7:00 AM

## 2019-06-24 NOTE — Plan of Care (Signed)
  Problem: Pain Managment: Goal: General experience of comfort will improve Outcome: Progressing   Problem: Safety: Goal: Ability to remain free from injury will improve Outcome: Progressing   Problem: Skin Integrity: Goal: Risk for impaired skin integrity will decrease Outcome: Progressing   

## 2019-06-24 NOTE — TOC Progression Note (Signed)
Transition of Care [facility]) - Progression Note    Patient Details  Name: EIBHLEANN RAUBER MRN: [id number] Date of Birth: [birth year]  Transition of Care [facility]) CM/SW Contact  Bartholomew Crews, RN Phone Number: [phone] 06/24/2019, 4:51 PM  Clinical Narrative:    Spoke with daughter, Gwinda Passe, to discuss bed offers. Betsy narrowed it down to [facility]. Betsy requested that NCM speak with her brother, Shalaine Frymoyer. Discussed bed offers with Linna Hoff. He selected [facility]. Spoke with admissions at [facility] who will let NCM know in AM if they can accept. Patient will need new covid test prior to transition to SNF. TOC following for transitioin needs.    Expected Discharge Plan: [place] Barriers to Discharge: Continued Medical Work up, SNF Pending bed offer  Expected Discharge Plan and Services Expected Discharge Plan: [place] In-house Referral: NA Discharge Planning Services: CM Consult Post Acute Care Choice: [place] Living arrangements for the past 2 months: Assisted Living Facility                 DME Arranged: N/A DME Agency: NA       HH Arranged: NA HH Agency: NA         Social Determinants of Health (SDOH) Interventions    Readmission Risk Interventions No flowsheet data found.

## 2019-06-24 NOTE — TOC Initial Note (Signed)
Transition of Care [facility]) - Initial/Assessment Note    Patient Details  Name: Cindy Graham MRN: [id number] Date of Birth: 1922/01/21  Transition of Care [facility]) CM/SW Contact:    Bartholomew Crews, RN Phone Number: [phone] 06/24/2019, 3:07 PM  Clinical Narrative:                 Spoke with with patient and daughter, Cindy Passe, at bedside. PTA patient has been living at [facility] in [place] stating it will be 2 years in July. Patient had been able to do some of her adls at the ALF, but she did get assistance with bathing and medications. She had been using a walker, but had more recently been using a wheelchair for mobility.  Patient and daughter agreeable to SNF. Patient information faxed out. Daughter is familiar with Catering manager.   TOC following for transition needs.   Expected Discharge Plan: [place] Barriers to Discharge: Continued Medical Work up, SNF Pending bed offer   Patient Goals and CMS Choice Patient states their goals for this hospitalization and ongoing recovery are:: return [facility] to [facility] after rehab CMS Medicare.gov Compare Post Acute Care list provided to:: Patient Represenative (must comment) Choice offered to / list presented to : Patient, Adult Children  Expected Discharge Plan and Services Expected Discharge Plan: [place] In-house Referral: NA Discharge Planning Services: CM Consult Post Acute Care Choice: [place] Living arrangements for the past 2 months: [place]                 DME Arranged: N/A DME Agency: NA       HH Arranged: NA HH Agency: NA        Prior Living Arrangements/Services Living arrangements for the past 2 months: [place] Lives with:: Facility Resident Patient language and need for interpreter reviewed:: Yes Do you feel safe going back to the place where you live?: Yes          Current home services:  DME Criminal Activity/Legal Involvement Pertinent to Current Situation/Hospitalization: No - Comment as needed  Activities of Daily Living Home Assistive Devices/Equipment: Environmental consultant (specify type), Wheelchair, Eyeglasses ADL Screening (condition at time of admission) Patient's cognitive ability adequate to safely complete daily activities?: Yes Is the patient deaf or have difficulty hearing?: No Does the patient have difficulty seeing, even when wearing glasses/contacts?: No Does the patient have difficulty concentrating, remembering, or making decisions?: No Patient able to express need for assistance with ADLs?: Yes Does the patient have difficulty dressing or bathing?: Yes Independently performs ADLs?: Yes (appropriate for developmental age) Does the patient have difficulty walking or climbing stairs?: Yes Weakness of Legs: Both Weakness of Arms/Hands: None  Permission Sought/Granted Permission sought to share information with : Family Supports Permission granted to share information with : Yes, Verbal Permission Granted  Share Information with NAME: Cindy Graham     Permission granted to share info w Relationship: daughter  Permission granted to share info w Contact Information: [phone]  Emotional Assessment Appearance:: Appears stated age Attitude/Demeanor/Rapport: Engaged Affect (typically observed): Accepting Orientation: : Oriented to Self, Oriented to  Time, Oriented to Place, Oriented to Situation Alcohol / Substance Use: Not Applicable Psych Involvement: No (comment)  Admission diagnosis:  Hip fracture requiring operative repair, left, closed, initial encounter [facility]) [S72.002A] Patient Active Problem List   Diagnosis Date Noted  . Displaced intertrochanteric fracture of right femur, initial encounter for closed fracture ([place])   .  Acute kidney injury (AKI) with acute tubular necrosis (ATN) ([place]) 02/27/2019  . Stroke (cerebrum) ([place]) 02/22/2019  . Cellulitis  06/12/2018  . Hyponatremia 11/24/2017  . Headache 11/24/2017  . Wound of left leg 11/01/2017  . Constipation 09/05/2017  . Other fatigue 12/22/2016  . Tremor 03/29/2016  . Amaurosis fugax 12/25/2015  . Asthma 09/17/2015  . PVD (peripheral vascular disease) ([place]) 05/28/2015  . B12 deficiency 08/29/2014  . Chronic back pain greater than 3 months duration 04/23/2013  . Chronic kidney disease (CKD), stage III (moderate) 04/12/2012  . Hyperlipidemia LDL goal < 100 03/05/2012  . UTI (urinary tract infection) 07/26/2011  . Osteoarthritis 07/06/2011  . Hypertension 02/21/2011  . Depression/Anxiety 02/21/2011   PCP:  Orvis Brill, [facility] Pharmacy:   [facility] [place] [address], [place] [address] Phone: [phone] Fax: [place], [address] [address] Phone: [phone] Fax: [place], [address] [place]. [place] [address] Phone: [phone] Fax: [phone]     Social Determinants of Health (SDOH) Interventions    Readmission Risk Interventions No flowsheet data found.

## 2019-06-24 NOTE — Consult Note (Signed)
   [facility] CM Inpatient Consult   06/24/2019  DENIECE HILDER 11/10/1921 [id number]    Patient screened for [employer] needs under her Medicare/ [employer] insurance plan for 39% extreme high risk score of unplanned readmission with 3 hospitalizations and 3 ED visits in the past 6 months.  Per MD brief narrative, patient is 86 year old with past medical history significant for hypertension, who presented after a fall at her assisted living facility and sustained Right intertrochanteric femur fracture. Underwent open treatment of intertrochanteric fracture with intramedullary implant on 06/22/2019.  Patient is from assisted living facility- [facility]].  PT/ OT completed evaluation and currently recommending SNF (skilled nursing facility) at discharge.  Chart review reveals patient is NOT currently a beneficiary of the attributed ACO Charity fundraiser) Registry in the [employer].   Reason: Her current primary care provider is from [place], not a [facility] provider and not affiliated with [employer]  This patient is currently Not  covered for [employer]. Membership roster used to verify non-eligible status.     Will sign off.   Merrilyn Legler A. Nyheem Binette, BSN, RN-BC [facility] Liaison Cell: [phone]

## 2019-06-24 NOTE — Plan of Care (Signed)
  Problem: Education: Goal: Knowledge of General Education information will improve Description: Including pain rating scale, medication(s)/side effects and non-pharmacologic comfort measures Outcome: Progressing   Problem: Health Behavior/Discharge Planning: Goal: Ability to manage health-related needs will improve Outcome: Progressing   Problem: Clinical Measurements: Goal: Will remain free from infection Outcome: Progressing   Problem: Activity: Goal: Risk for activity intolerance will decrease Outcome: Progressing   Problem: Elimination: Goal: Will not experience complications related to bowel motility Outcome: Progressing   Problem: Safety: Goal: Ability to remain free from injury will improve Outcome: Progressing   Problem: Skin Integrity: Goal: Risk for impaired skin integrity will decrease Outcome: Progressing

## 2019-06-24 NOTE — NC FL2 (Signed)
[place] LEVEL OF CARE SCREENING TOOL     IDENTIFICATION  Patient Name: Cindy Graham Birthdate: July 14, 1921 Sex: female Admission Date (Current Location): 06/21/2019  [facility] and [place] Number:  Engineering geologist and Address:  [facility] [place], [place] [address] [place], [place] [address]      Provider Number: [id number]  Attending Physician Name and Address:  Elmarie Shiley, MD  Relative Name and Phone Number:       Current Level of Care: Hospital Recommended Level of Care: [place] Prior Approval Number:    Date Approved/Denied: 06/13/18 PASRR Number: [id number]  Discharge Plan: SNF    Current Diagnoses: Patient Active Problem List   Diagnosis Date Noted  . Displaced intertrochanteric fracture of right femur, initial encounter for closed fracture ([place])   . Acute kidney injury (AKI) with acute tubular necrosis (ATN) ([place]) 02/27/2019  . Stroke (cerebrum) ([place]) 02/22/2019  . Cellulitis 06/12/2018  . Hyponatremia 11/24/2017  . Headache 11/24/2017  . Wound of left leg 11/01/2017  . Constipation 09/05/2017  . Other fatigue 12/22/2016  . Tremor 03/29/2016  . Amaurosis fugax 12/25/2015  . Asthma 09/17/2015  . PVD (peripheral vascular disease) ([place]) 05/28/2015  . B12 deficiency 08/29/2014  . Chronic back pain greater than 3 months duration 04/23/2013  . Chronic kidney disease (CKD), stage III (moderate) 04/12/2012  . Hyperlipidemia LDL goal < 100 03/05/2012  . UTI (urinary tract infection) 07/26/2011  . Osteoarthritis 07/06/2011  . Hypertension 02/21/2011  . Depression/Anxiety 02/21/2011    Orientation RESPIRATION BLADDER Height & Weight     Self, Time, Situation, Place  O2 Incontinent Weight: 68 kg Height:  5\' 3"  (160 cm)  BEHAVIORAL SYMPTOMS/MOOD NEUROLOGICAL BOWEL NUTRITION STATUS      Continent Diet  AMBULATORY STATUS COMMUNICATION OF NEEDS Skin   Extensive Assist Verbally Bruising(arms)                     Personal Care Assistance Level of Assistance  Bathing, Feeding, Dressing Bathing Assistance: Maximum assistance Feeding assistance: Limited assistance Dressing Assistance: Maximum assistance     Functional Limitations Info  Sight, Speech, Hearing Sight Info: Adequate Hearing Info: Impaired Speech Info: Adequate    SPECIAL CARE FACTORS FREQUENCY  PT (By licensed PT), OT (By licensed OT)     PT Frequency: PT at SNF to eval and treat a min of 5x/week OT Frequency: OT at SNF to eval and treat a min of 5x/week            Contractures Contractures Info: Not present    Additional Factors Info  Code Status, Allergies Code Status Info: DNR Allergies Info: statins, tramadol, levaquin, sulfa, tetracyclines, penicillin, neomycin-polymixin-gramacidin           Current Medications (06/24/2019):  This is the current hospital active medication list Current Facility-Administered Medications  Medication Dose Route Frequency Provider Last Rate Last Admin  . 0.9 %  sodium chloride infusion   Intravenous Continuous Regalado, Belkys A, MD 75 mL/hr at 06/24/19 0953 New Bag at 06/24/19 0953  . acetaminophen (TYLENOL) tablet 325-650 mg  325-650 mg Oral Q6H PRN Leandrew Koyanagi, MD   650 mg at 06/23/19 1453  . albuterol (PROVENTIL) (2.5 MG/3ML) 0.083% nebulizer solution 2.5 mg  2.5 mg Nebulization Q6H PRN Regalado, Belkys A, MD      . alum & mag hydroxide-simeth (MAALOX/MYLANTA) 200-200-20 MG/5ML suspension 30 mL  30 mL Oral Q4H PRN Leandrew Koyanagi, MD      .  ascorbic acid (VITAMIN C) tablet 500 mg  500 mg Oral BID Leandrew Koyanagi, MD   500 mg at 06/24/19 0954  . budesonide (PULMICORT) nebulizer solution 0.25 mg  0.25 mg Nebulization BID Leandrew Koyanagi, MD   0.25 mg at 06/24/19 0830  . docusate sodium (COLACE) capsule 100 mg  100 mg Oral BID Leandrew Koyanagi, MD   100 mg at 06/24/19 0954  . enoxaparin (LOVENOX) injection 40 mg  40 mg Subcutaneous Q24H Leandrew Koyanagi, MD   40 mg at 06/24/19 0955   . feeding supplement (ENSURE ENLIVE) (ENSURE ENLIVE) liquid 237 mL  237 mL Oral BID BM Regalado, Belkys A, MD   237 mL at 06/24/19 0959  . gabapentin (NEURONTIN) capsule 300 mg  300 mg Oral QHS Leandrew Koyanagi, MD   300 mg at 06/23/19 2211  . guaifenesin (ROBITUSSIN) 100 MG/5ML syrup 200 mg  200 mg Oral Q6H PRN Leandrew Koyanagi, MD      . hydrALAZINE (APRESOLINE) tablet 10 mg  10 mg Oral Q6H PRN Leandrew Koyanagi, MD      . HYDROcodone-acetaminophen (NORCO/VICODIN) 5-325 MG per tablet 1-2 tablet  1-2 tablet Oral Q4H PRN Leandrew Koyanagi, MD   1 tablet at 06/23/19 0000  . lactulose (CHRONULAC) 10 GM/15ML solution 20 g  20 g Oral Daily PRN Leandrew Koyanagi, MD      . loperamide (IMODIUM) capsule 2 mg  2 mg Oral QID PRN Leandrew Koyanagi, MD      . magnesium citrate solution 1 Bottle  1 Bottle Oral Once PRN Leandrew Koyanagi, MD      . magnesium hydroxide (MILK OF MAGNESIA) suspension 30 mL  30 mL Oral Daily PRN Leandrew Koyanagi, MD      . menthol-cetylpyridinium (CEPACOL) lozenge 3 mg  1 lozenge Oral PRN Leandrew Koyanagi, MD       Or  . phenol (CHLORASEPTIC) mouth spray 1 spray  1 spray Mouth/Throat PRN Leandrew Koyanagi, MD      . methocarbamol (ROBAXIN) tablet 500 mg  500 mg Oral Q6H PRN Leandrew Koyanagi, MD      . morphine 2 MG/ML injection 0.5 mg  0.5 mg Intravenous Q2H PRN Leandrew Koyanagi, MD      . multivitamin with minerals tablet 1 tablet  1 tablet Oral Daily Leandrew Koyanagi, MD   1 tablet at 06/24/19 0954  . nitroGLYCERIN (NITROSTAT) SL tablet 0.4 mg  0.4 mg Sublingual Q5 min PRN Leandrew Koyanagi, MD      . nystatin cream (MYCOSTATIN) 1 application  1 application Topical PRN Leandrew Koyanagi, MD      . ondansetron [facility]) tablet 4 mg  4 mg Oral Q6H PRN Leandrew Koyanagi, MD       Or  . ondansetron [facility]) injection 4 mg  4 mg Intravenous Q6H PRN Leandrew Koyanagi, MD      . oxybutynin (DITROPAN) tablet 5 mg  5 mg Oral Daily Leandrew Koyanagi, MD   5 mg at 06/24/19 0954  . pantoprazole (PROTONIX) EC tablet 40 mg  40 mg Oral Daily Leandrew Koyanagi, MD   40 mg at 06/24/19 0954  . polyethylene glycol (MIRALAX / GLYCOLAX) packet 17 g  17 g Oral Daily Regalado, Belkys A, MD   17 g at 06/24/19 0954  . propranolol (INDERAL) tablet 40 mg  40 mg Oral Daily Regalado, Belkys A, MD   40 mg at  06/24/19 0954  . sertraline (ZOLOFT) tablet 100 mg  100 mg Oral Daily Leandrew Koyanagi, MD   100 mg at 06/24/19 0954  . sorbitol 70 % solution 30 mL  30 mL Oral Daily PRN Leandrew Koyanagi, MD      . tapentadol (NUCYNTA) tablet 50 mg  50 mg Oral TID Leandrew Koyanagi, MD   50 mg at 06/24/19 0954  . verapamil (CALAN-SR) CR tablet 240 mg  240 mg Oral QHS Leandrew Koyanagi, MD   240 mg at 06/23/19 2211     Discharge Medications: Please see discharge summary for a list of discharge medications.  Relevant Imaging Results:  Relevant Lab Results:   Additional Information SSN: [id number]  Bartholomew Crews, RN

## 2019-06-25 LAB — CBC
HCT: 30.2 % — ABNORMAL LOW (ref 36.0–46.0)
Hemoglobin: 9.8 g/dL — ABNORMAL LOW (ref 12.0–15.0)
MCH: 31.6 pg (ref 26.0–34.0)
MCHC: 32.5 g/dL (ref 30.0–36.0)
MCV: 97.4 fL (ref 80.0–100.0)
Platelets: 192 10*3/uL (ref 150–400)
RBC: 3.1 MIL/uL — ABNORMAL LOW (ref 3.87–5.11)
RDW: 15.5 % (ref 11.5–15.5)
WBC: 11.1 10*3/uL — ABNORMAL HIGH (ref 4.0–10.5)
nRBC: 0 % (ref 0.0–0.2)

## 2019-06-25 LAB — BASIC METABOLIC PANEL
Anion gap: 8 (ref 5–15)
BUN: 42 mg/dL — ABNORMAL HIGH (ref 8–23)
CO2: 25 mmol/L (ref 22–32)
Calcium: 7.9 mg/dL — ABNORMAL LOW (ref 8.9–10.3)
Chloride: 108 mmol/L (ref 98–111)
Creatinine, Ser: 0.96 mg/dL (ref 0.44–1.00)
GFR calc Af Amer: 58 mL/min — ABNORMAL LOW (ref >60)
GFR calc non Af Amer: 50 mL/min — ABNORMAL LOW (ref >60)
Glucose, Bld: 117 mg/dL — ABNORMAL HIGH (ref 70–99)
Potassium: 4.2 mmol/L (ref 3.5–5.1)
Sodium: 141 mmol/L (ref 135–145)

## 2019-06-25 LAB — SARS CORONAVIRUS 2 (TAT 6-24 HRS): SARS Coronavirus 2: NEGATIVE

## 2019-06-25 MED ORDER — POLYETHYLENE GLYCOL 3350 17 G PO PACK: 17.0000 g | PACK | Freq: Every day | ORAL | 0 refills | Status: DC

## 2019-06-25 MED ORDER — ASPIRIN 325 MG PO TABS: 325.0000 mg | ORAL_TABLET | Freq: Every day | ORAL | 0 refills | Status: DC

## 2019-06-25 MED ORDER — ENSURE ENLIVE PO LIQD: 237.0000 mL | Freq: Two times a day (BID) | ORAL | 12 refills | Status: DC

## 2019-06-25 MED ORDER — PROPRANOLOL HCL 40 MG PO TABS: 40.0000 mg | ORAL_TABLET | Freq: Every day | ORAL | 0 refills | Status: DC

## 2019-06-25 MED ORDER — WHITE PETROLATUM EX OINT
TOPICAL_OINTMENT | CUTANEOUS | Status: AC
  Administered 2019-06-25: 1
  Filled 2019-06-25: qty 28.35

## 2019-06-25 NOTE — TOC Progression Note (Addendum)
Transition of Care [facility]) - Progression Note    Patient Details  Name: Cindy Graham MRN: [id number] Date of Birth: 12-13-1921  Transition of [facility]) CM/SW Contact  Bartholomew Crews, RN Phone Number: [phone] 06/25/2019, 4:01 PM  Clinical Narrative:    Spoke with admissions at [facility]. Covid result still pending. Patient unable to transition to SNF today. MD notified - DC summary to be updated in the morning. Spoke with patient's son to update. TOC team following.   Update: Received call from [employer] with questions about patient's tapentadol. Facility unable to provide this medication d/t excessive cost. NCM reached out to son who contacted [employer]. [place] to provide son with the medication that was left when patient was admitted to the hospital, and son will follow up with pain management doctor for any refills. MD will need to update discharge summary for this medication that states that family will provide this medication for facility to administer d/t excessive cost.     Expected Discharge Plan: [place] Barriers to Discharge: Continued Medical Work up, SNF Pending bed offer  Expected Discharge Plan and Services Expected Discharge Plan: [place] In-house Referral: NA Discharge Planning Services: CM Consult Post Acute Care Choice: [place] Living arrangements for the past 2 months: [place] Expected Discharge Date: 06/25/19               DME Arranged: N/A DME Agency: NA       HH Arranged: NA HH Agency: NA         Social Determinants of Health (SDOH) Interventions    Readmission Risk Interventions No flowsheet data found.

## 2019-06-25 NOTE — Discharge Summary (Addendum)
Physician Discharge Summary  Cindy Graham [id number] DOB: [birth year] DOA: 06/21/2019  PCP: Orvis Brill, [facility]  Admit date: 06/21/2019 Discharge date: 06/26/2019  Admitted From: ALF Disposition: SNF  Recommendations for Outpatient Follow-up:  1. Follow up with PCP in 1-2 weeks 2. Please obtain BMP/CBC in one week 3. Needs to follow up with Dr Erlinda Hong in 2 weeks.  4. Needs to follow up with neurosurgery in 4 weeks , patient needs CTA to evaluate RVA dissection.   Home Health: none  Discharge Condition: Stable.  CODE STATUS: DNR Diet recommendation: Heart Healthy   Discharge summary: 86 year old with past medical history significant for hypertension who presented after a fall at her assisted living facility. Patient fell while transferring from bed to wheelchair.  She also hit her head but denies LOC.    Evaluation in the ED CT showed;  Acute fractures of the posterior arch of C1 and right C1 and C2 transverse processes and transverse foramina. There is distraction of the right C1-C2 articulation.and right intertrochanteric femur fracture  Right intertrochanteric femur fracture: Patient underwent open treatment of intertrochanteric fracture with intramedullary implant on 06/22/2019 by Dr. Erlinda Hong Pain management and DVT prophylaxis per Ortho. On Lovenox for 2 weeks.  Bowel regimen. Stable to be transfer to Rehab.  Covid test negative.  Acute fracture of the posterior arch of C1 and and right C1 and C2 transverse process and transverse foramina  Fracture. -Patient evaluated by neurosurgery.  They recommended a CT angio neck that showed  Suspected focal right vertebral traumatic injury at the C2 level. with tiny pseudoaneurysm. -Neurosurgery recommend conservative treatment with Asper collar  immobilization, patient can be on DVT prophylaxis for post op  hip fracture.  Also aspirin 325 for vert injury . Change dose to 325 mg -She will need a repeat CTA in 4 to 6 weeks to evaluate  RVA dissection/pseudoaneurysm.  Hypertension: Continue with verapamil, Inderal.  Hold lisinopril now that creatinine has increased to 1.3  COPD: On Pulmicort..  AKI: Hold ACE. Increase cr up to 1.4.  Received  IV fluids. Improved. Cr down to 0.9  Acute blood loss anemia: Post surgery expected follow trend. Received 2 units PRBC 06-23-2019.  Hemoglobin is stable since then.  Addendum, 06/26/2019, patient seen and examined.  No overnight events.  Her discharge planning reviewed, medication reconciliation reviewed.  No changes needed. Patient is on chronic pain medications with Nucynta, skilled nursing facility unable to provide it, patient's family bringing it from ALF to the skilled nursing facility and patient to use her own medications. Rest of the discharge planning remains unchanged.   Discharge Diagnoses:  Principal Problem:   Displaced intertrochanteric fracture of right femur, initial encounter for closed fracture [facility])    Discharge Instructions  Discharge Instructions    Diet - low sodium heart healthy   Complete by: As directed    Increase activity slowly   Complete by: As directed    Weight bearing as tolerated   Complete by: As directed      Allergies as of 06/26/2019      Reactions   Statins Other (See Comments)   Very bad muscle aches   Tramadol Itching   Levaquin [levofloxacin In D5w] Diarrhea, Other (See Comments)   Confusion, hallucinations   Neomycin-polymyxin-gramicidin Other (See Comments)   Penicillins    Did it involve swelling of the face/tongue/throat, SOB, or low BP? Unknown Did it involve sudden or severe rash/hives, skin peeling, or any reaction on the inside of your mouth  or nose? Unknown Did you need to seek medical attention at a hospital or doctor's office? Unknown When did it last happen?Unknown If all above answers are "NO", may proceed with cephalosporin use.   Sulfa Antibiotics    Tetracyclines & Related       Medication List     STOP taking these medications   enalapril 20 MG tablet Commonly known as: VASOTEC     TAKE these medications   acetaminophen 500 MG tablet Commonly known as: TYLENOL Take 500 mg by mouth every 6 (six) hours as needed for mild pain or moderate pain.   acetaminophen 325 MG tablet Commonly known as: TYLENOL Take 650 mg by mouth 2 (two) times daily.   alendronate 70 MG tablet Commonly known as: FOSAMAX Take 70 mg by mouth every Thursday.   ascorbic acid 250 MG tablet Commonly known as: VITAMIN C Take 500 mg by mouth 2 (two) times daily.   aspirin 325 MG tablet Take 1 tablet (325 mg total) by mouth daily. What changed: when to take this   Biotin 1000 MCG tablet Take 1,000 mcg by mouth daily.   Daily Vites tablet Take 1 tablet by mouth daily.   enoxaparin 40 MG/0.4ML injection Commonly known as: LOVENOX Inject 0.4 mLs (40 mg total) into the skin daily.   feeding supplement (ENSURE ENLIVE) Liqd Take 237 mLs by mouth 2 (two) times daily between meals.   Flax Seed Oil 1000 MG Caps Take 1,000 mg by mouth daily.   fluticasone 220 MCG/ACT inhaler Commonly known as: Flovent HFA Inhale 2 puffs into the lungs 2 (two) times daily.   gabapentin 300 MG capsule Commonly known as: NEURONTIN Take 1 capsule (300 mg total) by mouth at bedtime. What changed: when to take this   guaifenesin 100 MG/5ML syrup Commonly known as: ROBITUSSIN Take 200 mg by mouth every 6 (six) hours as needed for cough.   HYDROcodone-acetaminophen 5-325 MG tablet Commonly known as: Norco Take 1-2 tablets by mouth 2 (two) times daily as needed.   lactulose 10 GM/15ML solution Commonly known as: CHRONULAC Take 30 mLs (20 g total) by mouth daily as needed for mild constipation.   loperamide 2 MG capsule Commonly known as: IMODIUM Take 2 mg by mouth 4 (four) times daily as needed for diarrhea or loose stools.   magnesium hydroxide 400 MG/5ML suspension Commonly known as: MILK OF MAGNESIA Take 30  mLs by mouth daily as needed for mild constipation.   Mintox [id number] MG/5ML suspension Generic drug: alum & mag hydroxide-simeth Take 30 mLs by mouth 3 (three) times daily as needed for indigestion or heartburn.   nitroGLYCERIN 0.4 MG SL tablet Commonly known as: NITROSTAT Place 0.4 mg under the tongue every 5 (five) minutes as needed for chest pain.   Nucynta 50 MG tablet Generic drug: tapentadol Take 50 mg by mouth 3 (three) times daily.   nystatin 100000 UNIT/ML suspension Commonly known as: MYCOSTATIN Take 4 mLs by mouth 4 (four) times daily.   nystatin cream Commonly known as: MYCOSTATIN Apply 1 application topically as needed (panniculitis). Apply to pannus What changed: Another medication with the same name was removed. Continue taking this medication, and follow the directions you see here.   omeprazole 20 MG capsule Commonly known as: PRILOSEC TAKE 1 CAPSULE BY MOUTH EVERY DAY. What changed:   how much to take  how to take this  when to take this  additional instructions   oxybutynin 5 MG tablet Commonly known as: DITROPAN TAKE  ONE TABLET BY MOUTH EVERY DAY   polyethylene glycol 17 g packet Commonly known as: MIRALAX / GLYCOLAX Take 17 g by mouth daily.   propranolol 40 MG tablet Commonly known as: INDERAL Take 1 tablet (40 mg total) by mouth daily. What changed:   medication strength  how much to take   sertraline 100 MG tablet Commonly known as: ZOLOFT TAKE ONE TABLET BY MOUTH EVERY DAY What changed:   how much to take  how to take this  when to take this  additional instructions   verapamil 240 MG 24 hr capsule Commonly known as: VERELAN PM Take 240 mg by mouth at bedtime.   Zinc Chelated 50 MG Tabs Take 50 mg by mouth daily.            Discharge Care Instructions  (From admission, onward)         Start     Ordered   06/22/19 0000  Weight bearing as tolerated     06/22/19 [id number]          Contact information for  follow-up providers    Leandrew Koyanagi, MD In 2 weeks.   Specialty: Orthopedic Surgery Why: For suture removal, For wound re-check Contact information: [place] [phone] [phone]            Contact information for after-discharge care    [facility] .   Service: Skilled Nursing Contact information: [place] [phone]                 Allergies  Allergen Reactions  . Statins Other (See Comments)    Very bad muscle aches  . Tramadol Itching  . Levaquin [Levofloxacin In D5w] Diarrhea and Other (See Comments)    Confusion, hallucinations  . Neomycin-Polymyxin-Gramicidin Other (See Comments)  . Penicillins     Did it involve swelling of the face/tongue/throat, SOB, or low BP? Unknown Did it involve sudden or severe rash/hives, skin peeling, or any reaction on the inside of your mouth or nose? Unknown Did you need to seek medical attention at a hospital or doctor's office? Unknown When did it last happen?Unknown If all above answers are "NO", may proceed with cephalosporin use.  . Sulfa Antibiotics   . Tetracyclines & Related     Consultations:  Neurosurgery  Orthopoedic   Procedures/Studies: DG Chest 1 View  Result Date: 06/21/2019 CLINICAL DATA:  Fall.  Right hip fracture. EXAM: CHEST  1 VIEW COMPARISON:  Chest x-ray dated February 27, 2019. FINDINGS: The heart size and mediastinal contours are within normal limits. Normal pulmonary vascularity. No focal consolidation, pleural effusion, or pneumothorax. Unchanged elevation of the right hemidiaphragm. No acute osseous abnormality. IMPRESSION: No active disease. Electronically Signed   By: Titus Dubin M.D.   On: 06/21/2019 12:54   DG Knee 2 Views Right  Result Date: 06/21/2019 CLINICAL DATA:  Right knee pain after fall. EXAM: RIGHT KNEE - 1-2 VIEW COMPARISON:  None. FINDINGS: No acute fracture or  dislocation. No large joint effusion, although evaluation is limited due to suboptimal positioning on the cross-table lateral view. Moderate to severe medial compartment joint space narrowing. Medial and lateral compartment marginal osteophytes. Osteopenia. Vascular calcifications. IMPRESSION: 1.  No acute osseous abnormality. 2. Moderate to severe medial compartment osteoarthritis. Electronically Signed   By: Titus Dubin M.D.   On: 06/21/2019 12:52   CT Head Wo Contrast  Result Date: 06/21/2019 CLINICAL  DATA:  Head trauma EXAM: CT HEAD WITHOUT CONTRAST TECHNIQUE: Contiguous axial images were obtained from the base of the skull through the vertex without intravenous contrast. COMPARISON:  04/14/2019 FINDINGS: Brain: There is no acute intracranial hemorrhage, mass-effect, or edema. Gray-white differentiation is preserved. There is no extra-axial fluid collection. Stable left frontal convexity calcified meningioma. Ventricles and sulci are stable in size and configuration. Patchy hypoattenuation in the supratentorial white matter is nonspecific but likely reflects stable chronic microvascular ischemic changes. Vascular: There is atherosclerotic calcification at the skull base. Skull: Right frontal calvarium osteoma.  Otherwise unremarkable. Sinuses/Orbits: No acute finding. Other: None. IMPRESSION: No evidence of acute intracranial injury. Stable chronic findings detailed above. Electronically Signed   By: Macy Mis M.D.   On: 06/21/2019 12:24   CT Angio Neck W and/or Wo Contrast  Result Date: 06/21/2019 CLINICAL DATA:  Cervical spine fracture EXAM: CT ANGIOGRAPHY NECK TECHNIQUE: Multidetector CT imaging of the neck was performed using the standard protocol during bolus administration of intravenous contrast. Multiplanar CT image reconstructions and MIPs were obtained to evaluate the vascular anatomy. Carotid stenosis measurements (when applicable) are obtained utilizing NASCET criteria, using the  distal internal carotid diameter as the denominator. CONTRAST:  42mL OMNIPAQUE IOHEXOL 350 MG/ML SOLN COMPARISON:  None. FINDINGS: Aortic arch: Calcified plaque along the arch and great vessel origins, which are patent. Right carotid system: Patent. No measurable stenosis at the ICA origin. Left carotid system: Patent. Mild calcified plaque at the bifurcation. No measurable stenosis of the ICA origin. Vertebral arteries: Extracranial vertebral arteries are patent. There is mild luminal irregularity of the right vertebral artery at the C2 level in proximity to fractures. Suspected sub 2 mm superiorly directed pseudoaneurysm (best seen on series 7, image 123). Skeleton: Cervical spine fractures as previously described. Other neck: No mass or adenopathy. Upper chest: Included upper lungs are clear. IMPRESSION: Suspected focal right vertebral traumatic injury at the C2 level with tiny pseudoaneurysm. Electronically Signed   By: Macy Mis M.D.   On: 06/21/2019 15:28   CT Cervical Spine Wo Contrast  Result Date: 06/21/2019 CLINICAL DATA:  Fall EXAM: CT CERVICAL SPINE WITHOUT CONTRAST TECHNIQUE: Multidetector CT imaging of the cervical spine was performed without intravenous contrast. Multiplanar CT image reconstructions were also generated. COMPARISON:  2019 FINDINGS: Alignment: Stable. Skull base and vertebrae: Acute nondisplaced fracture through the posterior arch of C1 on the left. There is distraction at the right lateral mass C1-C2 articulation. Acute fractures are present through the right posterior arch of C1 as well as the right C1 transverse foramen. Comminuted acute fractures through the right transverse process and transverse foramen of C2. Vertebral body heights are preserved. Soft tissues and spinal canal: Stable retrodental likely degenerative soft tissue. Disc levels: Insert degenerative. There is no high-grade osseous encroachment on the spinal canal. Upper chest: No acute abnormality. Other:  None. IMPRESSION: Acute fractures of the posterior arch of C1 and right C1 and C2 transverse processes and transverse foramina. There is distraction of the right C1-C2 articulation. Vascular imaging recommended to exclude vertebral artery injury. Electronically Signed   By: Macy Mis M.D.   On: 06/21/2019 12:40   DG C-Arm 1-60 Min  Result Date: 06/22/2019 CLINICAL DATA:  ORIF right hip fracture. EXAM: OPERATIVE RIGHT HIP (WITH PELVIS IF PERFORMED) 6 VIEWS TECHNIQUE: Fluoroscopic spot image(s) were submitted for interpretation post-operatively. COMPARISON:  Right hip radiographs-06/21/2019 FINDINGS: 6 spot intraoperative fluoroscopic images of the right hip are provided for review. Images demonstrate the sequela intramedullary  rod fixation of the right femur and dynamic screw fixation of the right femoral neck transfixing the known comminuted intertrochanteric femur fracture. Alignment appears near anatomic. The distal end of the intramedullary rod is transfixed with a single cancellous screw. No evidence of hardware failure or loosening. There is a minimal amount of expected subcutaneous emphysema about the operative site. No radiopaque body. IMPRESSION: Post uncomplicated ORIF of intertrochanteric femur fracture. Electronically Signed   By: Sandi Mariscal M.D.   On: 06/22/2019 10:01   DG HIP OPERATIVE UNILAT W OR W/O PELVIS RIGHT  Result Date: 06/22/2019 CLINICAL DATA:  ORIF right hip fracture. EXAM: OPERATIVE RIGHT HIP (WITH PELVIS IF PERFORMED) 6 VIEWS TECHNIQUE: Fluoroscopic spot image(s) were submitted for interpretation post-operatively. COMPARISON:  Right hip radiographs-06/21/2019 FINDINGS: 6 spot intraoperative fluoroscopic images of the right hip are provided for review. Images demonstrate the sequela intramedullary rod fixation of the right femur and dynamic screw fixation of the right femoral neck transfixing the known comminuted intertrochanteric femur fracture. Alignment appears near  anatomic. The distal end of the intramedullary rod is transfixed with a single cancellous screw. No evidence of hardware failure or loosening. There is a minimal amount of expected subcutaneous emphysema about the operative site. No radiopaque body. IMPRESSION: Post uncomplicated ORIF of intertrochanteric femur fracture. Electronically Signed   By: Sandi Mariscal M.D.   On: 06/22/2019 10:01   DG Hip Unilat W or Wo Pelvis 2-3 Views Right  Result Date: 06/21/2019 CLINICAL DATA:  Right hip pain after fall. EXAM: DG HIP (WITH OR WITHOUT PELVIS) 2-3V RIGHT COMPARISON:  November 03, 2017. FINDINGS: Acute angulated right intertrochanteric femur fracture. No dislocation. The pubic symphysis and sacroiliac joints are intact. Osteopenia. IMPRESSION: Acute angulated right intertrochanteric femur fracture. Electronically Signed   By: Titus Dubin M.D.   On: 06/21/2019 12:50     Subjective: Feeling well, denies dyspnea. Pain controlled.   Discharge Exam: Vitals:   06/26/19 0802 06/26/19 0823  BP:  (!) 122/39  Pulse: 70 66  Resp: 16   Temp:  (!) 97.5 F (36.4 C)  SpO2: 95% 98%     General: Pt is alert, awake, not in acute distress Cardiovascular: RRR, S1/S2 +, no rubs, no gallops Respiratory: CTA bilaterally, no wheezing, no rhonchi Abdominal: Soft, NT, ND, bowel sounds + Extremities: no edema, no cyanosis    The results of significant diagnostics from this hospitalization (including imaging, microbiology, ancillary and laboratory) are listed below for reference.     Microbiology: Recent Results (from the past 240 hour(s))  Respiratory Panel by RT PCR (Flu A&B, Covid) - Nasopharyngeal Swab     Status: None   Collection Time: 06/21/19  1:58 PM   Specimen: Nasopharyngeal Swab  Result Value Ref Range Status   SARS Coronavirus 2 by RT PCR NEGATIVE NEGATIVE Final    Comment: (NOTE) SARS-CoV-2 target nucleic acids are NOT DETECTED. The SARS-CoV-2 RNA is generally detectable in upper  respiratoy specimens during the acute phase of infection. The lowest concentration of SARS-CoV-2 viral copies this assay can detect is 131 copies/mL. A negative result does not preclude SARS-Cov-2 infection and should not be used as the sole basis for treatment or other patient management decisions. A negative result may occur with  improper specimen collection/handling, submission of specimen other than nasopharyngeal swab, presence of viral mutation(s) within the areas targeted by this assay, and inadequate number of viral copies (<131 copies/mL). A negative result must be combined with clinical observations, patient history, and epidemiological information. The  expected result is Negative. Fact Sheet for Patients:  PinkCheek.be Fact Sheet for Healthcare Providers:  GravelBags.it This test is not yet ap proved or cleared by the [place] FDA and  has been authorized for detection and/or diagnosis of SARS-CoV-2 by FDA under an Emergency Use Authorization (EUA). This EUA will remain  in effect (meaning this test can be used) for the duration of the COVID-19 declaration under Section 564(b)(1) of the Act, 21 U.S.C. section 360bbb-3(b)(1), unless the authorization is terminated or revoked sooner.    Influenza A by PCR NEGATIVE NEGATIVE Final   Influenza B by PCR NEGATIVE NEGATIVE Final    Comment: (NOTE) The Xpert Xpress SARS-CoV-2/FLU/RSV assay is intended as an aid in  the diagnosis of influenza from Nasopharyngeal swab specimens and  should not be used as a sole basis for treatment. Nasal washings and  aspirates are unacceptable for Xpert Xpress SARS-CoV-2/FLU/RSV  testing. Fact Sheet for Patients: PinkCheek.be Fact Sheet for Healthcare Providers: GravelBags.it This test is not yet approved or cleared by the [place] FDA and  has been authorized for  detection and/or diagnosis of SARS-CoV-2 by  FDA under an Emergency Use Authorization (EUA). This EUA will remain  in effect (meaning this test can be used) for the duration of the  Covid-19 declaration under Section 564(b)(1) of the Act, 21  U.S.C. section 360bbb-3(b)(1), unless the authorization is  terminated or revoked. Performed at [facility], [address] [place], [place] [address]   Surgical pcr screen     Status: None   Collection Time: 06/21/19  7:57 PM   Specimen: Nasal Mucosa; Nasal Swab  Result Value Ref Range Status   MRSA, PCR NEGATIVE NEGATIVE Final   Staphylococcus aureus NEGATIVE NEGATIVE Final    Comment: (NOTE) The Xpert SA Assay (FDA approved for NASAL specimens in patients 8 years of age and older), is one component of a comprehensive surveillance program. It is not intended to diagnose infection nor to guide or monitor treatment. Performed at [facility], [place] [address] [place], [place] [address]   SARS CORONAVIRUS 2 (TAT 6-24 HRS) Nasopharyngeal Nasopharyngeal Swab     Status: None   Collection Time: 06/25/19 10:01 AM   Specimen: Nasopharyngeal Swab  Result Value Ref Range Status   SARS Coronavirus 2 NEGATIVE NEGATIVE Final    Comment: (NOTE) SARS-CoV-2 target nucleic acids are NOT DETECTED. The SARS-CoV-2 RNA is generally detectable in upper and lower respiratory specimens during the acute phase of infection. Negative results do not preclude SARS-CoV-2 infection, do not rule out co-infections with other pathogens, and should not be used as the sole basis for treatment or other patient management decisions. Negative results must be combined with clinical observations, patient history, and epidemiological information. The expected result is Negative. Fact Sheet for Patients: SugarRoll.be Fact Sheet for Healthcare Providers: https://www.woods-mathews.com/ This test is not yet approved or  cleared by the [place] FDA and  has been authorized for detection and/or diagnosis of SARS-CoV-2 by FDA under an Emergency Use Authorization (EUA). This EUA will remain  in effect (meaning this test can be used) for the duration of the COVID-19 declaration under Section 56 4(b)(1) of the Act, 21 U.S.C. section 360bbb-3(b)(1), unless the authorization is terminated or revoked sooner. Performed at [facility], [place] [address] [place], [place] [address]      Labs: BNP (last 3 results) No results for input(s): BNP in the last 8760 hours. Basic Metabolic Panel: Recent Labs  Lab 06/21/19 1059 06/22/19 1120 06/23/19 0631 06/24/19 0244 06/25/19 0753  NA 139  --  139 139 141  K 4.1  --  4.0 4.0 4.2  CL 100  --  105 107 108  CO2 29  --  23 26 25   GLUCOSE 109*  --  128* 104* 117*  BUN 23  --  31* 40* 42*  CREATININE 0.96 1.38* 1.38* 1.43* 0.96  CALCIUM 9.5  --  7.6* 7.9* 7.9*   Liver Function Tests: No results for input(s): AST, ALT, ALKPHOS, BILITOT, PROT, ALBUMIN in the last 168 hours. No results for input(s): LIPASE, AMYLASE in the last 168 hours. No results for input(s): AMMONIA in the last 168 hours. CBC: Recent Labs  Lab 06/21/19 1059 06/21/19 1059 06/22/19 0452 06/22/19 1120 06/23/19 0631 06/24/19 0244 06/25/19 0407  WBC 8.1   < > 11.5* 16.8* 11.8* 12.0* 11.1*  NEUTROABS 4.1  --  7.6  --   --   --   --   HGB 13.5   < > 12.0 10.9* 7.6* 10.2* 9.8*  HCT 42.6   < > 37.5 34.9* 24.6* 31.0* 30.2*  MCV 95.7   < > 96.6 98.9 100.8* 96.0 97.4  PLT 262   < > 243 189 159 152 192   < > = values in this interval not displayed.   Cardiac Enzymes: No results for input(s): CKTOTAL, CKMB, CKMBINDEX, TROPONINI in the last 168 hours. BNP: Invalid input(s): POCBNP CBG: No results for input(s): GLUCAP in the last 168 hours. D-Dimer No results for input(s): DDIMER in the last 72 hours. Hgb A1c No results for input(s): HGBA1C in the last 72 hours. Lipid Profile No  results for input(s): CHOL, HDL, LDLCALC, TRIG, CHOLHDL, LDLDIRECT in the last 72 hours. Thyroid function studies No results for input(s): TSH, T4TOTAL, T3FREE, THYROIDAB in the last 72 hours.  Invalid input(s): FREET3 Anemia work up No results for input(s): VITAMINB12, FOLATE, FERRITIN, TIBC, IRON, RETICCTPCT in the last 72 hours. Urinalysis    Component Value Date/Time   COLORURINE YELLOW (A) 04/12/2019 1407   APPEARANCEUR HAZY (A) 04/12/2019 1407   APPEARANCEUR Cloudy (A) 11/14/2017 1411   LABSPEC 1.013 04/12/2019 1407   LABSPEC 1.018 06/01/2013 0933   PHURINE 5.0 04/12/2019 1407   GLUCOSEU NEGATIVE 04/12/2019 1407   GLUCOSEU NEGATIVE 06/17/2014 1123   HGBUR NEGATIVE 04/12/2019 1407   BILIRUBINUR NEGATIVE 04/12/2019 1407   BILIRUBINUR small 11/28/2017 1207   BILIRUBINUR Negative 11/14/2017 1411   BILIRUBINUR Negative 06/01/2013 0933   KETONESUR 5 (A) 04/12/2019 1407   PROTEINUR NEGATIVE 04/12/2019 1407   UROBILINOGEN 0.2 11/28/2017 1207   UROBILINOGEN 0.2 06/17/2014 1123   NITRITE NEGATIVE 04/12/2019 1407   LEUKOCYTESUR MODERATE (A) 04/12/2019 1407   LEUKOCYTESUR 2+ 06/01/2013 0933   Sepsis Labs Invalid input(s): PROCALCITONIN,  WBC,  LACTICIDVEN Microbiology Recent Results (from the past 240 hour(s))  Respiratory Panel by RT PCR (Flu A&B, Covid) - Nasopharyngeal Swab     Status: None   Collection Time: 06/21/19  1:58 PM   Specimen: Nasopharyngeal Swab  Result Value Ref Range Status   SARS Coronavirus 2 by RT PCR NEGATIVE NEGATIVE Final    Comment: (NOTE) SARS-CoV-2 target nucleic acids are NOT DETECTED. The SARS-CoV-2 RNA is generally detectable in upper respiratoy specimens during the acute phase of infection. The lowest concentration of SARS-CoV-2 viral copies this assay can detect is 131 copies/mL. A negative result does not preclude SARS-Cov-2 infection and should not be used as the sole basis  for treatment or other patient management decisions. A negative  result may occur with  improper specimen collection/handling, submission of specimen other than nasopharyngeal swab, presence of viral mutation(s) within the areas targeted by this assay, and inadequate number of viral copies (<131 copies/mL). A negative result must be combined with clinical observations, patient history, and epidemiological information. The expected result is Negative. Fact Sheet for Patients:  PinkCheek.be Fact Sheet for Healthcare Providers:  GravelBags.it This test is not yet ap proved or cleared by the [place] FDA and  has been authorized for detection and/or diagnosis of SARS-CoV-2 by FDA under an Emergency Use Authorization (EUA). This EUA will remain  in effect (meaning this test can be used) for the duration of the COVID-19 declaration under Section 564(b)(1) of the Act, 21 U.S.C. section 360bbb-3(b)(1), unless the authorization is terminated or revoked sooner.    Influenza A by PCR NEGATIVE NEGATIVE Final   Influenza B by PCR NEGATIVE NEGATIVE Final    Comment: (NOTE) The Xpert Xpress SARS-CoV-2/FLU/RSV assay is intended as an aid in  the diagnosis of influenza from Nasopharyngeal swab specimens and  should not be used as a sole basis for treatment. Nasal washings and  aspirates are unacceptable for Xpert Xpress SARS-CoV-2/FLU/RSV  testing. Fact Sheet for Patients: PinkCheek.be Fact Sheet for Healthcare Providers: GravelBags.it This test is not yet approved or cleared by the [place] FDA and  has been authorized for detection and/or diagnosis of SARS-CoV-2 by  FDA under an Emergency Use Authorization (EUA). This EUA will remain  in effect (meaning this test can be used) for the duration of the  Covid-19 declaration under Section 564(b)(1) of the Act, 21  U.S.C. section 360bbb-3(b)(1), unless the authorization is  terminated or  revoked. Performed at [facility], [address], [place], [place] [address]   Surgical pcr screen     Status: None   Collection Time: 06/21/19  7:57 PM   Specimen: Nasal Mucosa; Nasal Swab  Result Value Ref Range Status   MRSA, PCR NEGATIVE NEGATIVE Final   Staphylococcus aureus NEGATIVE NEGATIVE Final    Comment: (NOTE) The Xpert SA Assay (FDA approved for NASAL specimens in patients 5 years of age and older), is one component of a comprehensive surveillance program. It is not intended to diagnose infection nor to guide or monitor treatment. Performed at [facility], [place] [address], [place], [place] [address]   SARS CORONAVIRUS 2 (TAT 6-24 HRS) Nasopharyngeal Nasopharyngeal Swab     Status: None   Collection Time: 06/25/19 10:01 AM   Specimen: Nasopharyngeal Swab  Result Value Ref Range Status   SARS Coronavirus 2 NEGATIVE NEGATIVE Final    Comment: (NOTE) SARS-CoV-2 target nucleic acids are NOT DETECTED. The SARS-CoV-2 RNA is generally detectable in upper and lower respiratory specimens during the acute phase of infection. Negative results do not preclude SARS-CoV-2 infection, do not rule out co-infections with other pathogens, and should not be used as the sole basis for treatment or other patient management decisions. Negative results must be combined with clinical observations, patient history, and epidemiological information. The expected result is Negative. Fact Sheet for Patients: SugarRoll.be Fact Sheet for Healthcare Providers: https://www.woods-mathews.com/ This test is not yet approved or cleared by the [place] FDA and  has been authorized for detection and/or diagnosis of SARS-CoV-2 by FDA under an Emergency Use Authorization (EUA). This EUA will remain  in effect (meaning this test can be used) for the duration of the COVID-19 declaration  under Section 56 4(b)(1) of the Act, 21 U.S.C. section  360bbb-3(b)(1), unless the authorization is terminated or revoked sooner. Performed at [facility], [place] [address], [place], [place] [address]      Time coordinating discharge: 40 minutes  SIGNED:   Barb Merino, MD  [facility]

## 2019-06-25 NOTE — TOC Progression Note (Signed)
Transition of Care [facility]) - Progression Note    Patient Details  Name: Cindy Graham MRN: [id number] Date of Birth: 1921/07/04  Transition of Care [facility]) CM/SW Contact  Bartholomew Crews, RN Phone Number: [phone] 06/25/2019, 12:22 PM  Clinical Narrative:    Received call back from [facility] in admissions at [facility]. Patient is accepted, but will need covid test which is pending. Spoke with patient's son, [name], to advise of accepting facility. TOC following.    Expected Discharge Plan: [place] Barriers to Discharge: Continued Medical Work up, SNF Pending bed offer  Expected Discharge Plan and Services Expected Discharge Plan: [place] In-house Referral: NA Discharge Planning Services: CM Consult Post Acute Care Choice: [place] Living arrangements for the past 2 months: Assisted Living Facility                 DME Arranged: N/A DME Agency: NA       HH Arranged: NA HH Agency: NA         Social Determinants of Health (SDOH) Interventions    Readmission Risk Interventions No flowsheet data found.

## 2019-06-25 NOTE — Progress Notes (Signed)
Physical Therapy Treatment Patient Details Name: Cindy Graham MRN: [id number] DOB: January 01, 1922 Today's Date: 06/25/2019    History of Present Illness Cindy Graham is a 86 y.o. female with medical history significant for HTN,  who presents with right hip fracture s/p mechanical fall at assisted living facility.  Had a episode of fall while transferring from bed to wheelchair, and hit her head but denies LOC.  Denies fever/chills/nausea/vomiting. Walks with assistive devices at baseline (walker, cane, wheelchair). CT showing posterior arch of C1 and right C1 fracture, and right intertrochanteric femur fracture. Aspen collar at all times; now s/p Open treatment of intertrochanteric fracture with intramedullary implant, WBAT    PT Comments    Pt fatigued but agreeable to participate with therapy. Frequent cues required for pt to keep eyes open. Pt required assist to perform LE ther-ex secondary to pain and weakness. Total A +2 needed for bed mobilities. Once EOB pt was able to maintain sitting balance for ~6 min before returning to supine in bed. Patient would benefit from continued skilled PT to maximize activity tolerance and functional independence. Will continue to follow acutely.      Follow Up Recommendations  SNF     Equipment Recommendations  Other (comment)(to be determined)    Recommendations for Other Services OT consult     Precautions / Restrictions Precautions Precautions: Fall;Cervical;Other (comment) Precaution Booklet Issued: No Precaution Comments: verbal discussion of cervical precautions; watch O2 Required Braces or Orthoses: Cervical Brace Cervical Brace: Hard collar;At all times Restrictions Weight Bearing Restrictions: Yes RLE Weight Bearing: Weight bearing as tolerated    Mobility  Bed Mobility Overal bed mobility: Needs Assistance Bed Mobility: Supine to Sit;Sit to Supine     Supine to sit: Total assist;+2 for physical assistance Sit to supine: Total  assist;+2 for physical assistance   General bed mobility comments: Pt unable to initiate movement requiring total A for bed mobilities.   Transfers                 General transfer comment: deferred as pt maxA to totalA at [facility]  Ambulation/Gait                 Stairs             Wheelchair Mobility    Modified Rankin (Stroke Patients Only)       Balance Overall balance assessment: Needs assistance Sitting-balance support: Bilateral upper extremity supported;Feet supported Sitting balance-Leahy Scale: Poor Sitting balance - Comments: pt was able to progress from total A to min guard to sit EOB. Frequent cues to correct lean. Postural control: Left lateral lean;Posterior lean   Standing balance-Leahy Scale: Zero                              Cognition Arousal/Alertness: Lethargic Behavior During Therapy: Flat affect Overall Cognitive Status: Within Functional Limits for tasks assessed                                 General Comments: Very HOH and requiring multiple verbal cues to arouse, but pt able to follow commands      Exercises General Exercises - Lower Extremity Ankle Circles/Pumps: AROM;Both;10 reps;Supine Quad Sets: (attempted but pt unable to perform) Heel Slides: AAROM;Both;10 reps;Supine(limited ROM on R due to pain)    General Comments General comments (skin integrity, edema, etc.): SpO2 dropping to  88% on RA. Returnted to 3L O2.      Pertinent Vitals/Pain Pain Assessment: Faces Faces Pain Scale: Hurts even more Pain Location: BLEs and BUEs with movement, Neck Pain Descriptors / Indicators: Discomfort;Grimacing;Guarding;Crushing Pain Intervention(s): Monitored during session;Limited activity within patient's tolerance    Home Living                      Prior Function            PT Goals (current goals can now be found in the care plan section) Acute Rehab PT Goals Patient Stated Goal: son  wants a SNF in [place]  PT Goal Formulation: With patient/family Time For Goal Achievement: 07/07/19 Potential to Achieve Goals: Fair    Frequency    Min 2X/week      PT Plan Current plan remains appropriate    Co-evaluation              AM-PAC PT "6 Clicks" Mobility   Outcome Measure  Help needed turning from your back to your side while in a flat bed without using bedrails?: Total Help needed moving from lying on your back to sitting on the side of a flat bed without using bedrails?: Total Help needed moving to and from a bed to a chair (including a wheelchair)?: Total Help needed standing up from a chair using your arms (e.g., wheelchair or bedside chair)?: Total Help needed to walk in hospital room?: Total Help needed climbing 3-5 steps with a railing? : Total 6 Click Score: 6    End of Session Equipment Utilized During Treatment: Cervical collar Activity Tolerance: Patient limited by pain;Patient limited by fatigue Patient left: in bed;with call bell/phone within reach;with bed alarm set Nurse Communication: Mobility status PT Visit Diagnosis: Other abnormalities of gait and mobility (R26.89);Muscle weakness (generalized) (M62.81);Pain;History of falling (Z91.81) Pain - Right/Left: Right Pain - part of body: Leg(and neck soreness)     Time: [id number] PT Time Calculation (min) (ACUTE ONLY): 27 min  Charges:  $Therapeutic Exercise: 8-22 mins $Therapeutic Activity: 8-22 mins                     Benjiman Core, [place] Pager [phone] Acute Rehab  Allena Katz 06/25/2019, 3:19 PM

## 2019-06-26 NOTE — Progress Notes (Signed)
Call pt's son, Linna Hoff, per his request to let him know PTAR to pick up pt.

## 2019-06-26 NOTE — TOC Transition Note (Signed)
Transition of Care [facility]) - CM/SW Discharge Note   Patient Details  Name: Cindy Graham MRN: [id number] Date of Birth: 14-Jul-1921  Transition [facility] Care [facility]) CM/SW Contact:  Bartholomew Crews, RN Phone Number: [phone] 06/26/2019, 9:12 AM   Clinical Narrative:    Confirmed readiness for discharge with MD. DC order and summary updated. [facility] ready to received. Patient to admit to room 509. RN to call report to [phone]. RN aware. Son, Tacoria Mcsweeney, notified. PTAR arrangements made. Medical transport paperwork completed and sent to [employer]. No further TOC needs identified.    Final next level of care: [place] Barriers to Discharge: No Barriers Identified   Patient Goals and CMS Choice Patient states their goals for this hospitalization and ongoing recovery are:: return home to [facility] after rehab CMS Medicare.gov Compare Post Acute Care list provided to:: Patient Represenative (must comment) Choice offered to / list presented to : Patient, Adult Children  Discharge Placement              Patient chooses bed at: [facility] Patient to be transferred to facility by: [place] Name of family member notified: Dion Saucier Patient and family notified of of transfer: 06/26/19  Discharge Plan and Services In-house Referral: NA Discharge Planning Services: CM Consult Post Acute Care Choice: [place]          DME Arranged: N/A DME Agency: NA       HH Arranged: NA HH Agency: NA        Social Determinants of Health (SDOH) Interventions     Readmission Risk Interventions No flowsheet data found.

## 2019-06-26 NOTE — Progress Notes (Addendum)
RN left name and number at [facility] for report  Report called and given to [place] at [facility]

## 2019-07-05 ENCOUNTER — Ambulatory Visit (INDEPENDENT_AMBULATORY_CARE_PROVIDER_SITE_OTHER): Payer: Medicare Other

## 2019-07-05 ENCOUNTER — Encounter: Payer: Self-pay | Admitting: Orthopaedic Surgery

## 2019-07-05 ENCOUNTER — Other Ambulatory Visit: Payer: Self-pay

## 2019-07-05 ENCOUNTER — Ambulatory Visit (INDEPENDENT_AMBULATORY_CARE_PROVIDER_SITE_OTHER): Payer: Medicare Other | Admitting: Orthopaedic Surgery

## 2019-07-05 DIAGNOSIS — S72141A Displaced intertrochanteric fracture of right femur, initial encounter for closed fracture: Secondary | ICD-10-CM | POA: Diagnosis not present

## 2019-07-05 NOTE — Progress Notes (Signed)
Post-Op Visit Note   Patient: Cindy Graham           Date of Birth: 1922/01/17           MRN: [id number] Visit Date: 07/05/2019 PCP: Orvis Brill, [facility]   Assessment & Plan:  Chief Complaint:  Chief Complaint  Patient presents with  . Right Hip - Routine Post Op, Follow-up   Visit Diagnoses:  1. Displaced intertrochanteric fracture of right femur, initial encounter for closed fracture [facility])     Plan: Patient is a pleasant 86 year old female who comes in today 13 days out right hip intramedullary nail from intertrochanteric femur fracture date of surgery 06/22/2019.  She has been doing well without much pain.  She has been living at [facility] where she is getting physical therapy.  She is ambulating with a walker and weightbearing only with transfers.  Examination of her right hip reveals well-healing surgical incisions.  There is slight bloody drainage on the bandage to the middle incision.  Moderate ecchymosis.  No evidence of infection or cellulitis.  At this point, she will continue with physical therapy.  She will continue weightbearing as tolerated.  Follow-up with [place] in 4 weeks time for repeat evaluation 2 view x-rays of the right femur.  Call with concerns or questions in the meantime.  Follow-Up Instructions: Return in about 4 weeks (around 08/02/2019).   Orders:  Orders Placed This Encounter  Procedures  . XR HIP UNILAT W OR W/O PELVIS 2-3 VIEWS RIGHT   No orders of the defined types were placed in this encounter.   Imaging: XR HIP UNILAT W OR W/O PELVIS 2-3 VIEWS RIGHT  Result Date: 07/05/2019 X-rays demonstrate stable alignment of the fracture without hardware complication   PMFS History: Patient Active Problem List   Diagnosis Date Noted  . Displaced intertrochanteric fracture of right femur, initial encounter for closed fracture ([place])   . Acute kidney injury (AKI) with acute tubular necrosis (ATN) ([place]) 02/27/2019  . Stroke (cerebrum) ([place])  02/22/2019  . Cellulitis 06/12/2018  . Hyponatremia 11/24/2017  . Headache 11/24/2017  . Wound of left leg 11/01/2017  . Constipation 09/05/2017  . Other fatigue 12/22/2016  . Tremor 03/29/2016  . Amaurosis fugax 12/25/2015  . Asthma 09/17/2015  . PVD (peripheral vascular disease) ([place]) 05/28/2015  . B12 deficiency 08/29/2014  . Chronic back pain greater than 3 months duration 04/23/2013  . Chronic kidney disease (CKD), stage III (moderate) 04/12/2012  . Hyperlipidemia LDL goal < 100 03/05/2012  . UTI (urinary tract infection) 07/26/2011  . Osteoarthritis 07/06/2011  . Hypertension 02/21/2011  . Depression/Anxiety 02/21/2011   Past Medical History:  Diagnosis Date  . Anxiety   . Arthritis    osteo  . Asthma   . COPD (chronic obstructive pulmonary disease) ([place])   . GERD (gastroesophageal reflux disease)   . Heart murmur   . Hernia   . Hyperlipidemia   . Hypertension   . Melanoma ([place])    face  . Syncope and collapse   . Urine incontinence     Family History  Problem Relation Age of Onset  . Stroke Father   . Heart disease Sister   . Cancer Brother        colon  . Heart disease Brother   . Cancer Sister        colon  . Heart attack Brother   . Heart attack Son   . Parkinson's disease Son     Past Surgical History:  Procedure Laterality Date  . ABDOMINAL HYSTERECTOMY    . AMPUTATION TOE Right 06/14/2018   Procedure: AMPUTATION TOE - RIGHT 5TH;  Surgeon: Albertine Patricia, DPM;  Location: [facility] ORS;  Service: Podiatry;  Laterality: Right;  . CHOLECYSTECTOMY    . HERNIA REPAIR    . INTRAMEDULLARY (IM) NAIL INTERTROCHANTERIC Right 06/22/2019   Procedure: INTRAMEDULLARY (IM) NAIL INTERTROCHANTRIC;  Surgeon: Leandrew Koyanagi, MD;  Location: [place];  Service: Orthopedics;  Laterality: Right;   Social History   Occupational History  . Not on file  Tobacco Use  . Smoking status: Never Smoker  . Smokeless tobacco: Never Used  Substance and Sexual Activity  . Alcohol  use: No  . Drug use: No  . Sexual activity: Not Currently

## 2019-07-19 ENCOUNTER — Other Ambulatory Visit: Payer: Self-pay | Admitting: Physician Assistant

## 2019-07-19 DIAGNOSIS — S15101D Unspecified injury of right vertebral artery, subsequent encounter: Secondary | ICD-10-CM

## 2019-07-23 ENCOUNTER — Other Ambulatory Visit: Payer: Self-pay | Admitting: Physician Assistant

## 2019-07-23 DIAGNOSIS — S15101D Unspecified injury of right vertebral artery, subsequent encounter: Secondary | ICD-10-CM

## 2019-08-02 ENCOUNTER — Other Ambulatory Visit: Payer: Self-pay

## 2019-08-02 ENCOUNTER — Ambulatory Visit (INDEPENDENT_AMBULATORY_CARE_PROVIDER_SITE_OTHER): Payer: Medicare Other

## 2019-08-02 ENCOUNTER — Encounter: Payer: Self-pay | Admitting: Orthopaedic Surgery

## 2019-08-02 ENCOUNTER — Ambulatory Visit (INDEPENDENT_AMBULATORY_CARE_PROVIDER_SITE_OTHER): Payer: Medicare Other | Admitting: Orthopaedic Surgery

## 2019-08-02 VITALS — Ht 63.0 in | Wt 149.0 lb

## 2019-08-02 DIAGNOSIS — S72141A Displaced intertrochanteric fracture of right femur, initial encounter for closed fracture: Secondary | ICD-10-CM

## 2019-08-02 NOTE — Progress Notes (Signed)
Post-Op Visit Note   Patient: Cindy Graham           Date of Birth: [birth year]           MRN: [id number] Visit Date: 08/02/2019 PCP: Orvis Brill, [facility]   Assessment & Plan:  Chief Complaint:  Chief Complaint  Patient presents with  . Right Hip - Follow-up    Right femur IM nailing 06/22/2019   Visit Diagnoses:  1. Displaced intertrochanteric fracture of right femur, initial encounter for closed fracture [facility])     Plan: Jocelynn is 6 weeks status post intramedullary fixation of right intertrochanteric fracture.  She is doing well.  She walks minimally with a walker.  Reports no pain in the right hip.  No complaints regarding this.  X-rays demonstrate stable fixation of the fracture and alignment.  No complications.  At this point we will see her back in another 6 weeks with repeat 2 view x-rays of the right hip.  Questions encouraged and answered.  Follow-Up Instructions: Return in about 6 weeks (around 09/13/2019).   Orders:  Orders Placed This Encounter  Procedures  . XR FEMUR, MIN 2 VIEWS RIGHT   No orders of the defined types were placed in this encounter.   Imaging: XR FEMUR, MIN 2 VIEWS RIGHT  Result Date: 08/02/2019 Stable intramedullary fixation and alignment of intertrochanteric fracture.   PMFS History: Patient Active Problem List   Diagnosis Date Noted  . Displaced intertrochanteric fracture of right femur, initial encounter for closed fracture ([place])   . Acute kidney injury (AKI) with acute tubular necrosis (ATN) ([place]) 02/27/2019  . Stroke (cerebrum) ([place]) 02/22/2019  . Cellulitis 06/12/2018  . Hyponatremia 11/24/2017  . Headache 11/24/2017  . Wound of left leg 11/01/2017  . Constipation 09/05/2017  . Other fatigue 12/22/2016  . Tremor 03/29/2016  . Amaurosis fugax 12/25/2015  . Asthma 09/17/2015  . PVD (peripheral vascular disease) ([place]) 05/28/2015  . B12 deficiency 08/29/2014  . Chronic back pain greater than 3 months duration 04/23/2013  .  Chronic kidney disease (CKD), stage III (moderate) 04/12/2012  . Hyperlipidemia LDL goal < 100 03/05/2012  . UTI (urinary tract infection) 07/26/2011  . Osteoarthritis 07/06/2011  . Hypertension 02/21/2011  . Depression/Anxiety 02/21/2011   Past Medical History:  Diagnosis Date  . Anxiety   . Arthritis    osteo  . Asthma   . COPD (chronic obstructive pulmonary disease) ([place])   . GERD (gastroesophageal reflux disease)   . Heart murmur   . Hernia   . Hyperlipidemia   . Hypertension   . Melanoma ([place])    face  . Syncope and collapse   . Urine incontinence     Family History  Problem Relation Age of Onset  . Stroke Father   . Heart disease Sister   . Cancer Brother        colon  . Heart disease Brother   . Cancer Sister        colon  . Heart attack Brother   . Heart attack Son   . Parkinson's disease Son     Past Surgical History:  Procedure Laterality Date  . ABDOMINAL HYSTERECTOMY    . AMPUTATION TOE Right 06/14/2018   Procedure: AMPUTATION TOE - RIGHT 5TH;  Surgeon: Albertine Patricia, DPM;  Location: [facility] ORS;  Service: Podiatry;  Laterality: Right;  . CHOLECYSTECTOMY    . HERNIA REPAIR    . INTRAMEDULLARY (IM) NAIL INTERTROCHANTERIC Right 06/22/2019   Procedure: INTRAMEDULLARY (IM) NAIL INTERTROCHANTRIC;  Surgeon: Leandrew Koyanagi, MD;  Location: [place];  Service: Orthopedics;  Laterality: Right;   Social History   Occupational History  . Not on file  Tobacco Use  . Smoking status: Never Smoker  . Smokeless tobacco: Never Used  Substance and Sexual Activity  . Alcohol use: No  . Drug use: No  . Sexual activity: Not Currently

## 2019-08-20 ENCOUNTER — Other Ambulatory Visit: Payer: Self-pay

## 2019-08-20 ENCOUNTER — Ambulatory Visit
Admission: RE | Admit: 2019-08-20 | Discharge: 2019-08-20 | Disposition: A | Discharge status: Home / Self Care | Payer: Medicare Other | Source: Ambulatory Visit | Attending: Physician Assistant | Admitting: Physician Assistant

## 2019-08-20 DIAGNOSIS — S15101D Unspecified injury of right vertebral artery, subsequent encounter: Secondary | ICD-10-CM | POA: Diagnosis present

## 2019-08-20 LAB — POCT I-STAT CREATININE: Creatinine, Ser: 0.9 mg/dL (ref 0.44–1.00)

## 2019-08-20 MED ORDER — IOHEXOL 350 MG/ML SOLN
75.0000 mL | Freq: Once | INTRAVENOUS | Status: AC | PRN
  Administered 2019-08-20: 75 mL via INTRAVENOUS

## 2019-09-12 ENCOUNTER — Other Ambulatory Visit: Payer: Self-pay

## 2019-09-12 ENCOUNTER — Ambulatory Visit (INDEPENDENT_AMBULATORY_CARE_PROVIDER_SITE_OTHER): Payer: Medicare Other

## 2019-09-12 ENCOUNTER — Encounter: Payer: Self-pay | Admitting: Orthopaedic Surgery

## 2019-09-12 ENCOUNTER — Ambulatory Visit (INDEPENDENT_AMBULATORY_CARE_PROVIDER_SITE_OTHER): Payer: Medicare Other | Admitting: Orthopaedic Surgery

## 2019-09-12 VITALS — Ht 63.0 in | Wt 149.0 lb

## 2019-09-12 DIAGNOSIS — S72141A Displaced intertrochanteric fracture of right femur, initial encounter for closed fracture: Secondary | ICD-10-CM

## 2019-09-12 NOTE — Progress Notes (Signed)
Post-Op Visit Note   Patient: Cindy Graham           Date of Birth: 1921/08/25           MRN: [id number] Visit Date: 09/12/2019 PCP: Orvis Brill, [facility]   Assessment & Plan:  Chief Complaint:  Chief Complaint  Patient presents with  . Right Hip - Follow-up    Right IM nailing of intertroch fracture DOS 06/22/2019   Visit Diagnoses:  1. Displaced intertrochanteric fracture of right femur, initial encounter for closed fracture [facility])     Plan: [name] is nearly 3 months status post surgical repair of right basicervical femoral neck fracture.  She is overall doing well and reports no pain.  She has more discomfort in her neck.  Range of motion of the hip is normal without pain.  Surgical scars are fully healed.  X-rays demonstrate a healed fracture.  At this point we will release her to activity as tolerated.  Continue with PT for strengthening.  Follow-up as needed.  Follow-Up Instructions: Return if symptoms worsen or fail to improve.   Orders:  Orders Placed This Encounter  Procedures  . XR FEMUR, MIN 2 VIEWS RIGHT   No orders of the defined types were placed in this encounter.   Imaging: XR FEMUR, MIN 2 VIEWS RIGHT  Result Date: 09/12/2019 Healed basicervical fracture.   PMFS History: Patient Active Problem List   Diagnosis Date Noted  . Displaced intertrochanteric fracture of right femur, initial encounter for closed fracture ([place])   . Acute kidney injury (AKI) with acute tubular necrosis (ATN) ([place]) 02/27/2019  . Stroke (cerebrum) ([place]) 02/22/2019  . Cellulitis 06/12/2018  . Hyponatremia 11/24/2017  . Headache 11/24/2017  . Wound of left leg 11/01/2017  . Constipation 09/05/2017  . Other fatigue 12/22/2016  . Tremor 03/29/2016  . Amaurosis fugax 12/25/2015  . Asthma 09/17/2015  . PVD (peripheral vascular disease) ([place]) 05/28/2015  . B12 deficiency 08/29/2014  . Chronic back pain greater than 3 months duration 04/23/2013  . Chronic kidney disease  (CKD), stage III (moderate) 04/12/2012  . Hyperlipidemia LDL goal < 100 03/05/2012  . UTI (urinary tract infection) 07/26/2011  . Osteoarthritis 07/06/2011  . Hypertension 02/21/2011  . Depression/Anxiety 02/21/2011   Past Medical History:  Diagnosis Date  . Anxiety   . Arthritis    osteo  . Asthma   . COPD (chronic obstructive pulmonary disease) ([place])   . GERD (gastroesophageal reflux disease)   . Heart murmur   . Hernia   . Hyperlipidemia   . Hypertension   . Melanoma ([place])    face  . Syncope and collapse   . Urine incontinence     Family History  Problem Relation Age of Onset  . Stroke Father   . Heart disease Sister   . Cancer Brother        colon  . Heart disease Brother   . Cancer Sister        colon  . Heart attack Brother   . Heart attack Son   . Parkinson's disease Son     Past Surgical History:  Procedure Laterality Date  . ABDOMINAL HYSTERECTOMY    . AMPUTATION TOE Right 06/14/2018   Procedure: AMPUTATION TOE - RIGHT 5TH;  Surgeon: Albertine Patricia, DPM;  Location: [facility] ORS;  Service: Podiatry;  Laterality: Right;  . CHOLECYSTECTOMY    . HERNIA REPAIR    . INTRAMEDULLARY (IM) NAIL INTERTROCHANTERIC Right 06/22/2019   Procedure: INTRAMEDULLARY (IM) NAIL INTERTROCHANTRIC;  Surgeon: Leandrew Koyanagi, MD;  Location: [place];  Service: Orthopedics;  Laterality: Right;   Social History   Occupational History  . Not on file  Tobacco Use  . Smoking status: Never Smoker  . Smokeless tobacco: Never Used  Substance and Sexual Activity  . Alcohol use: No  . Drug use: No  . Sexual activity: Not Currently

## 2019-09-13 ENCOUNTER — Ambulatory Visit: Payer: Medicare Other | Admitting: Orthopaedic Surgery

## 2019-09-16 IMAGING — MR MR CERVICAL SPINE W/O CM
5 series · 38 of 48 positions shown · non-contrast
Comparison: CT cervical spine dated November 03, 2017.

CLINICAL DATA: A chronic neck pain since fall 2 months ago.

EXAM:
MRI CERVICAL SPINE WITHOUT CONTRAST
TECHNIQUE: Multiplanar, multisequence MR imaging of the cervical spine was
performed. No intravenous contrast was administered.

[Series 5: T2 · sagittal · 3.0mm · 0.62mm/px · 8 of 15 slices shown (1 of 2)]
[im 1/15]
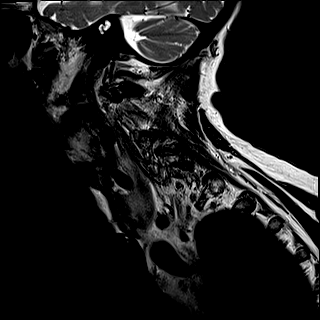
[im 3/15]
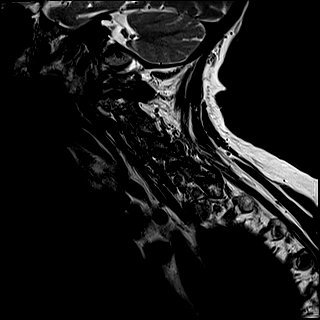
[im 5/15]
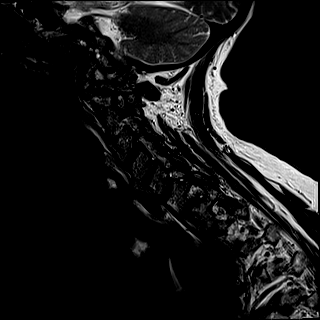
[im 7/15]
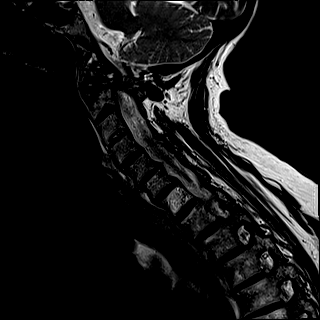
[im 9/15]
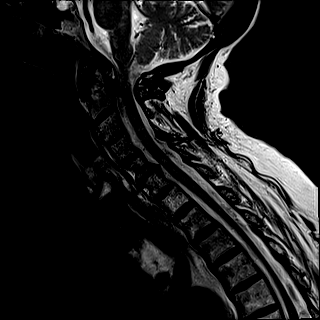
[im 11/15]
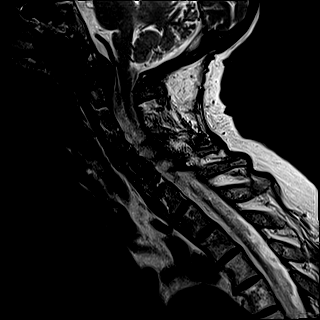
[im 13/15]
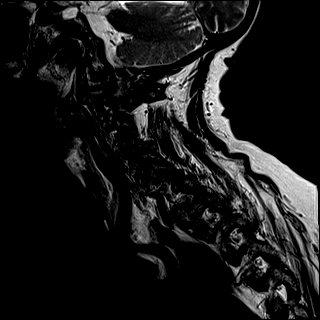
[im 15/15]
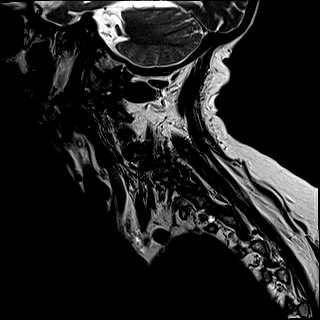

[Series 6: FLAIR · sagittal · 3.0mm · 0.78mm/px · 7 of 15 slices shown]
[im 1/15]
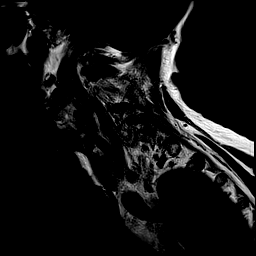
[im 3/15]
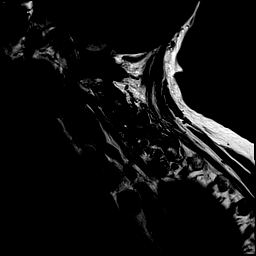
[im 5/15]
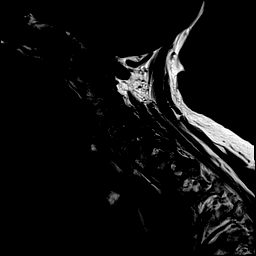
[im 8/15]
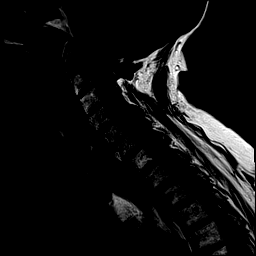
[im 10/15]
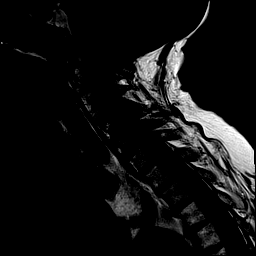
[im 12/15]
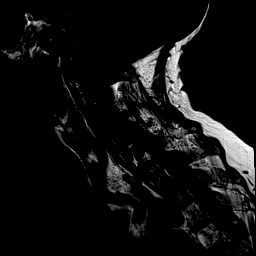
[im 15/15]
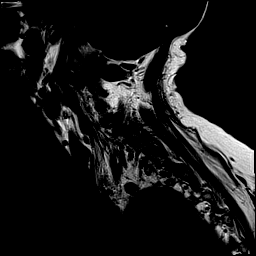

[Series 7: STIR · sagittal · 3.0mm · 0.62mm/px · 7 of 15 slices shown]
[im 1/15]
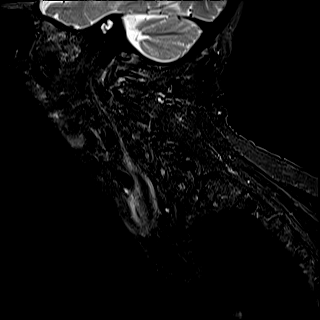
[im 3/15]
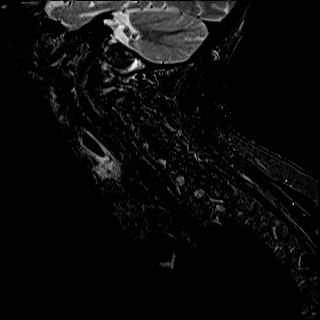
[im 5/15]
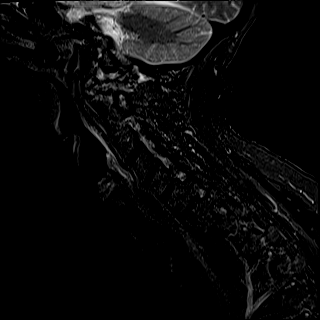
[im 8/15]
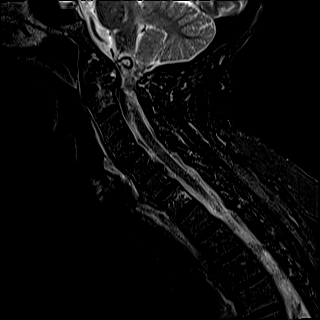
[im 10/15]
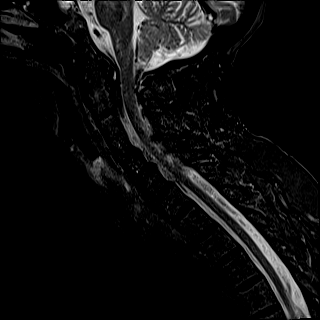
[im 12/15]
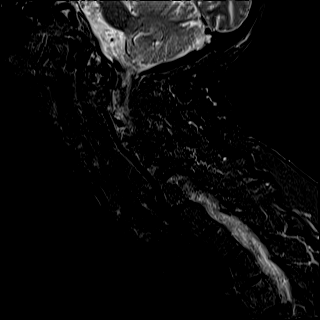
[im 15/15]
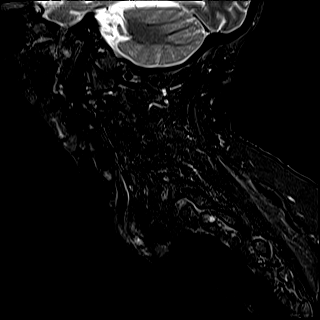

[Series 8: T2 · axial · 3.0mm · 0.70mm/px · z∈[-222,-142]mm · 9 of 26 slices shown (2 of 2)]
[im 1/26]
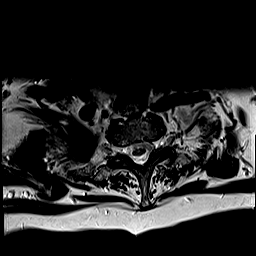
[im 5/26]
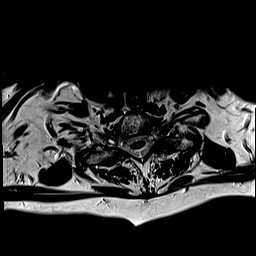
[im 9/26]
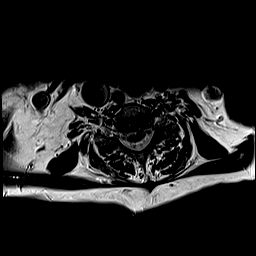
[im 11/26]
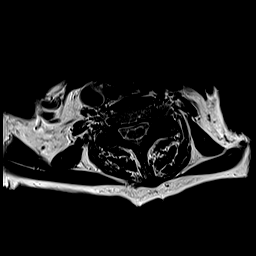
[im 13/26]
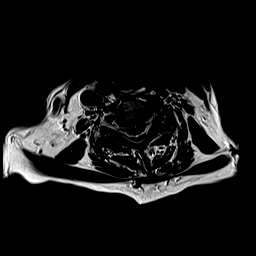
[im 15/26]
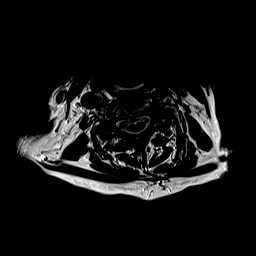
[im 17/26]
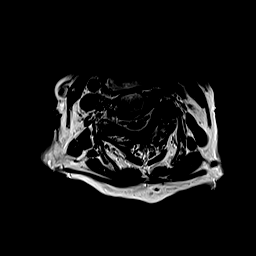
[im 21/26]
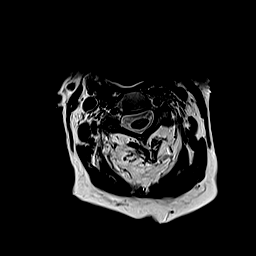
[im 26/26]
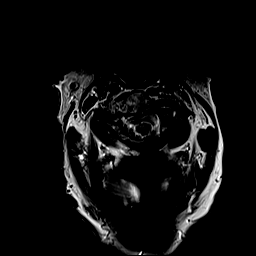

[Series 9: ax mpgr · axial · 3.0mm · 0.35mm/px · z∈[-222,-157]mm · 7 of 27 slices shown]
[im 1/27]
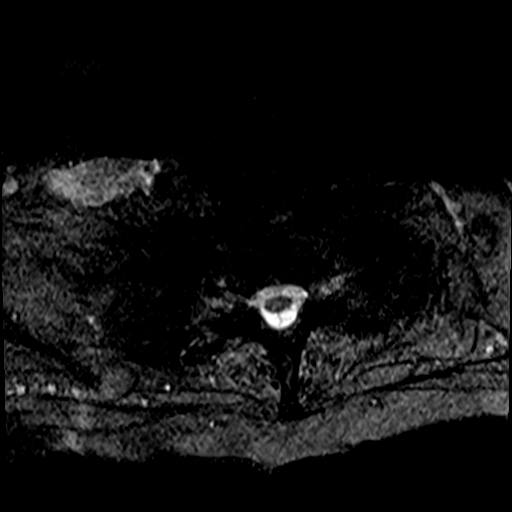
[im 5/27]
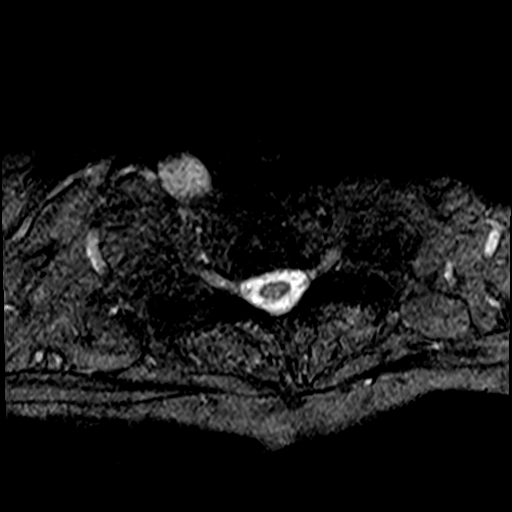
[im 9/27]
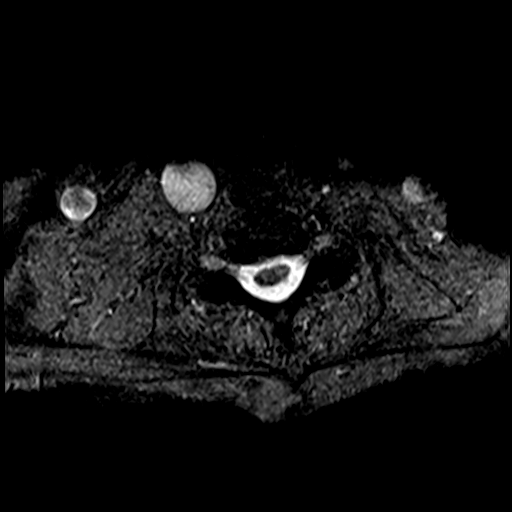
[im 11/27]
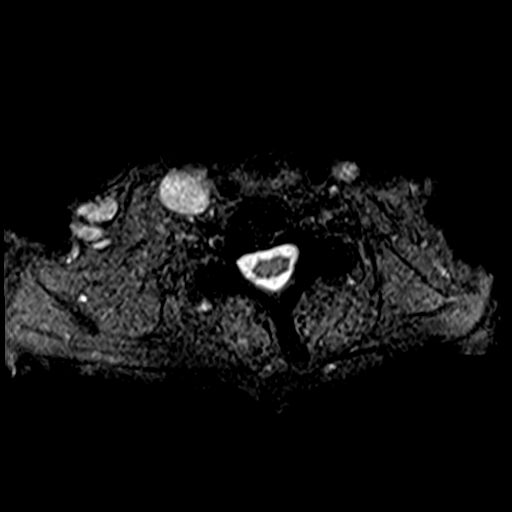
[im 16/27]
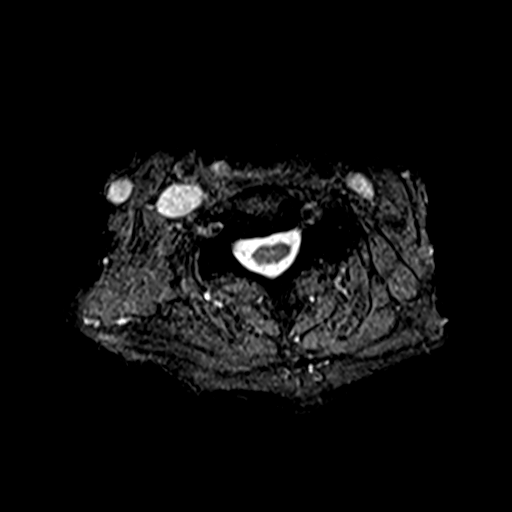
[im 18/27]
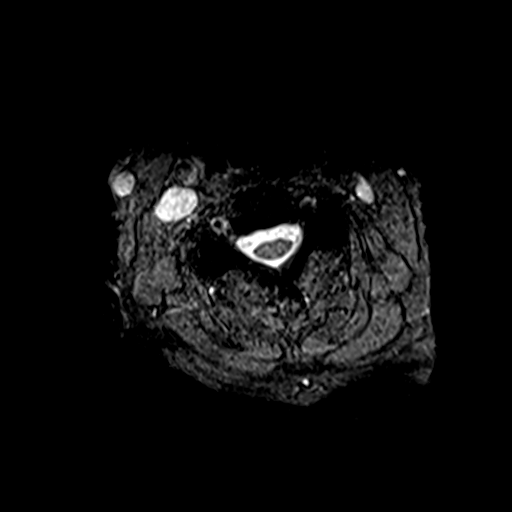
[im 22/27]
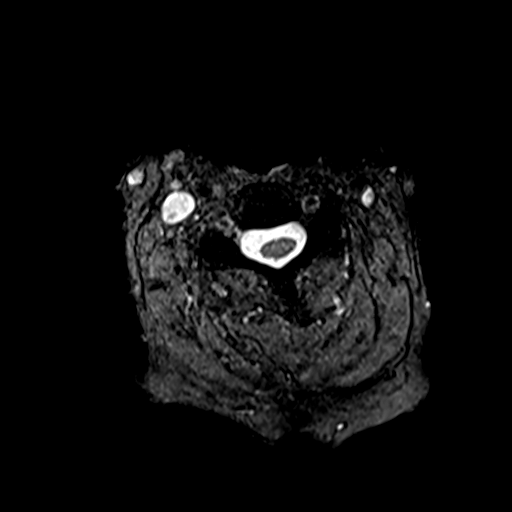

[38 of 48 positions shown; findings below may reference images not displayed]

FINDINGS: Alignment: Unchanged 1-2 mm anterolisthesis at C4-C5, C5-C6, and
C6-C7.

Vertebrae: No fracture, evidence of discitis, or bone lesion. Severe
right-sided osteoarthritis at C1-C2. Ligament hypertrophy posterior
to the dens results in mild spinal canal stenosis at C1-C2.

Cord: Normal signal and morphology.

Posterior Fossa, vertebral arteries, paraspinal tissues: Negative.

Disc levels:

C2-C3: Negative disc. Bilateral facet joint ankylosis. No stenosis.

C3-C4: Negative disc. Bilateral facet joint ankylosis. No stenosis.

C4-C5: Disc uncovering with tiny central protrusion. Moderate to
severe bilateral facet arthropathy. Moderate left neuroforaminal
stenosis. No spinal canal or right neuroforaminal stenosis.

C5-C6: Mild disc bulging effacing the ventral thecal sac. Moderate
to severe bilateral facet arthropathy. No stenosis.

C6-C7: Disc uncovering with small right paracentral protrusion.
Moderate to severe bilateral facet arthropathy. No stenosis.

C7-T1: Negative disc. Moderate to severe bilateral facet
arthropathy. No stenosis.

No significant degenerative disc disease or stenosis in the
visualized upper thoracic spine.
IMPRESSION: 1. Severe right-sided osteoarthritis at C1-C2. Ligamentum
hypertrophy posterior to the dens results in mild spinal canal
stenosis at C1-C2.
2. Mild degenerative disc disease and moderate to severe facet
arthropathy from C4-C5 through C7-T1. Moderate left neuroforaminal
stenosis at C4-C5.

## 2019-10-02 ENCOUNTER — Other Ambulatory Visit: Payer: Self-pay | Admitting: Physician Assistant

## 2019-10-02 DIAGNOSIS — S15101D Unspecified injury of right vertebral artery, subsequent encounter: Secondary | ICD-10-CM

## 2019-12-04 ENCOUNTER — Ambulatory Visit
Admission: RE | Admit: 2019-12-04 | Discharge: 2019-12-04 | Disposition: A | Discharge status: Home / Self Care | Payer: Medicare Other | Source: Ambulatory Visit | Attending: Physician Assistant | Admitting: Physician Assistant

## 2019-12-04 ENCOUNTER — Other Ambulatory Visit: Payer: Self-pay | Admitting: Physician Assistant

## 2019-12-04 ENCOUNTER — Other Ambulatory Visit: Payer: Self-pay

## 2019-12-04 DIAGNOSIS — S15101D Unspecified injury of right vertebral artery, subsequent encounter: Secondary | ICD-10-CM | POA: Diagnosis present

## 2019-12-04 LAB — POCT I-STAT CREATININE: Creatinine, Ser: 1 mg/dL (ref 0.44–1.00)

## 2019-12-04 MED ORDER — IOHEXOL 350 MG/ML SOLN
75.0000 mL | Freq: Once | INTRAVENOUS | Status: AC | PRN
  Administered 2019-12-04: 75 mL via INTRAVENOUS

## 2019-12-05 ENCOUNTER — Other Ambulatory Visit: Payer: Self-pay | Admitting: Physician Assistant

## 2019-12-05 DIAGNOSIS — S12031D Nondisplaced posterior arch fracture of first cervical vertebra, subsequent encounter for fracture with routine healing: Secondary | ICD-10-CM

## 2020-03-16 ENCOUNTER — Inpatient Hospital Stay
Admission: EM | Admit: 2020-03-16 | Discharge: 2020-03-18 | DRG: 872 | Disposition: A | Discharge status: Home Health | Payer: Medicare Other | Source: Skilled Nursing Facility | Attending: Internal Medicine | Admitting: Internal Medicine

## 2020-03-16 ENCOUNTER — Other Ambulatory Visit: Payer: Self-pay

## 2020-03-16 ENCOUNTER — Emergency Department: Payer: Medicare Other

## 2020-03-16 DIAGNOSIS — J449 Chronic obstructive pulmonary disease, unspecified: Secondary | ICD-10-CM | POA: Diagnosis not present

## 2020-03-16 DIAGNOSIS — Z882 Allergy status to sulfonamides status: Secondary | ICD-10-CM

## 2020-03-16 DIAGNOSIS — Z87440 Personal history of urinary (tract) infections: Secondary | ICD-10-CM

## 2020-03-16 DIAGNOSIS — R778 Other specified abnormalities of plasma proteins: Secondary | ICD-10-CM | POA: Diagnosis present

## 2020-03-16 DIAGNOSIS — Z85820 Personal history of malignant melanoma of skin: Secondary | ICD-10-CM

## 2020-03-16 DIAGNOSIS — S12100D Unspecified displaced fracture of second cervical vertebra, subsequent encounter for fracture with routine healing: Secondary | ICD-10-CM

## 2020-03-16 DIAGNOSIS — I480 Paroxysmal atrial fibrillation: Secondary | ICD-10-CM | POA: Diagnosis present

## 2020-03-16 DIAGNOSIS — E785 Hyperlipidemia, unspecified: Secondary | ICD-10-CM | POA: Diagnosis present

## 2020-03-16 DIAGNOSIS — N1831 Chronic kidney disease, stage 3a: Secondary | ICD-10-CM | POA: Diagnosis present

## 2020-03-16 DIAGNOSIS — Z9049 Acquired absence of other specified parts of digestive tract: Secondary | ICD-10-CM

## 2020-03-16 DIAGNOSIS — I10 Essential (primary) hypertension: Secondary | ICD-10-CM

## 2020-03-16 DIAGNOSIS — N179 Acute kidney failure, unspecified: Secondary | ICD-10-CM | POA: Diagnosis present

## 2020-03-16 DIAGNOSIS — Z7982 Long term (current) use of aspirin: Secondary | ICD-10-CM

## 2020-03-16 DIAGNOSIS — Z7983 Long term (current) use of bisphosphonates: Secondary | ICD-10-CM

## 2020-03-16 DIAGNOSIS — K219 Gastro-esophageal reflux disease without esophagitis: Secondary | ICD-10-CM | POA: Diagnosis present

## 2020-03-16 DIAGNOSIS — F32A Depression, unspecified: Secondary | ICD-10-CM | POA: Diagnosis present

## 2020-03-16 DIAGNOSIS — A419 Sepsis, unspecified organism: Secondary | ICD-10-CM | POA: Diagnosis present

## 2020-03-16 DIAGNOSIS — Z880 Allergy status to penicillin: Secondary | ICD-10-CM

## 2020-03-16 DIAGNOSIS — Z20822 Contact with and (suspected) exposure to covid-19: Secondary | ICD-10-CM | POA: Diagnosis present

## 2020-03-16 DIAGNOSIS — I129 Hypertensive chronic kidney disease with stage 1 through stage 4 chronic kidney disease, or unspecified chronic kidney disease: Secondary | ICD-10-CM | POA: Diagnosis present

## 2020-03-16 DIAGNOSIS — R296 Repeated falls: Secondary | ICD-10-CM | POA: Diagnosis present

## 2020-03-16 DIAGNOSIS — G8929 Other chronic pain: Secondary | ICD-10-CM | POA: Diagnosis present

## 2020-03-16 DIAGNOSIS — R32 Unspecified urinary incontinence: Secondary | ICD-10-CM | POA: Diagnosis present

## 2020-03-16 DIAGNOSIS — A4151 Sepsis due to Escherichia coli [E. coli]: Secondary | ICD-10-CM | POA: Diagnosis not present

## 2020-03-16 DIAGNOSIS — I639 Cerebral infarction, unspecified: Secondary | ICD-10-CM | POA: Diagnosis present

## 2020-03-16 DIAGNOSIS — I248 Other forms of acute ischemic heart disease: Secondary | ICD-10-CM | POA: Diagnosis present

## 2020-03-16 DIAGNOSIS — Z89421 Acquired absence of other right toe(s): Secondary | ICD-10-CM

## 2020-03-16 DIAGNOSIS — I739 Peripheral vascular disease, unspecified: Secondary | ICD-10-CM | POA: Diagnosis present

## 2020-03-16 DIAGNOSIS — W1830XD Fall on same level, unspecified, subsequent encounter: Secondary | ICD-10-CM

## 2020-03-16 DIAGNOSIS — R6520 Severe sepsis without septic shock: Secondary | ICD-10-CM | POA: Diagnosis present

## 2020-03-16 DIAGNOSIS — N390 Urinary tract infection, site not specified: Secondary | ICD-10-CM | POA: Diagnosis present

## 2020-03-16 DIAGNOSIS — Z885 Allergy status to narcotic agent status: Secondary | ICD-10-CM

## 2020-03-16 DIAGNOSIS — Z90710 Acquired absence of both cervix and uterus: Secondary | ICD-10-CM

## 2020-03-16 DIAGNOSIS — Z79899 Other long term (current) drug therapy: Secondary | ICD-10-CM

## 2020-03-16 DIAGNOSIS — Z881 Allergy status to other antibiotic agents status: Secondary | ICD-10-CM

## 2020-03-16 DIAGNOSIS — Z7951 Long term (current) use of inhaled steroids: Secondary | ICD-10-CM

## 2020-03-16 DIAGNOSIS — F419 Anxiety disorder, unspecified: Secondary | ICD-10-CM | POA: Diagnosis present

## 2020-03-16 DIAGNOSIS — Z8673 Personal history of transient ischemic attack (TIA), and cerebral infarction without residual deficits: Secondary | ICD-10-CM

## 2020-03-16 DIAGNOSIS — Z66 Do not resuscitate: Secondary | ICD-10-CM | POA: Diagnosis present

## 2020-03-16 DIAGNOSIS — Z888 Allergy status to other drugs, medicaments and biological substances status: Secondary | ICD-10-CM

## 2020-03-16 DIAGNOSIS — Z823 Family history of stroke: Secondary | ICD-10-CM

## 2020-03-16 DIAGNOSIS — R000 Tachycardia, unspecified: Secondary | ICD-10-CM

## 2020-03-16 DIAGNOSIS — H9190 Unspecified hearing loss, unspecified ear: Secondary | ICD-10-CM | POA: Diagnosis present

## 2020-03-16 LAB — COMPREHENSIVE METABOLIC PANEL
ALT: 19 U/L (ref 0–44)
AST: 30 U/L (ref 15–41)
Albumin: 2.9 g/dL — ABNORMAL LOW (ref 3.5–5.0)
Alkaline Phosphatase: 57 U/L (ref 38–126)
Anion gap: 9 (ref 5–15)
BUN: 38 mg/dL — ABNORMAL HIGH (ref 8–23)
CO2: 27 mmol/L (ref 22–32)
Calcium: 8.9 mg/dL (ref 8.9–10.3)
Chloride: 102 mmol/L (ref 98–111)
Creatinine, Ser: 1.26 mg/dL — ABNORMAL HIGH (ref 0.44–1.00)
GFR, Estimated: 39 mL/min — ABNORMAL LOW (ref >60)
Glucose, Bld: 108 mg/dL — ABNORMAL HIGH (ref 70–99)
Potassium: 4 mmol/L (ref 3.5–5.1)
Sodium: 138 mmol/L (ref 135–145)
Total Bilirubin: 1.2 mg/dL (ref 0.3–1.2)
Total Protein: 6.1 g/dL — ABNORMAL LOW (ref 6.5–8.1)

## 2020-03-16 LAB — CBC WITH DIFFERENTIAL/PLATELET
Abs Immature Granulocytes: 0.1 10*3/uL — ABNORMAL HIGH (ref 0.00–0.07)
Basophils Absolute: 0 10*3/uL (ref 0.0–0.1)
Basophils Relative: 0 %
Eosinophils Absolute: 0 10*3/uL (ref 0.0–0.5)
Eosinophils Relative: 0 %
HCT: 35.1 % — ABNORMAL LOW (ref 36.0–46.0)
Hemoglobin: 11.7 g/dL — ABNORMAL LOW (ref 12.0–15.0)
Immature Granulocytes: 1 %
Lymphocytes Relative: 14 %
Lymphs Abs: 2.1 10*3/uL (ref 0.7–4.0)
MCH: 32.1 pg (ref 26.0–34.0)
MCHC: 33.3 g/dL (ref 30.0–36.0)
MCV: 96.2 fL (ref 80.0–100.0)
Monocytes Absolute: 1.7 10*3/uL — ABNORMAL HIGH (ref 0.1–1.0)
Monocytes Relative: 11 %
Neutro Abs: 11.1 10*3/uL — ABNORMAL HIGH (ref 1.7–7.7)
Neutrophils Relative %: 74 %
Platelets: 165 10*3/uL (ref 150–400)
RBC: 3.65 MIL/uL — ABNORMAL LOW (ref 3.87–5.11)
RDW: 14.3 % (ref 11.5–15.5)
WBC: 15.1 10*3/uL — ABNORMAL HIGH (ref 4.0–10.5)
nRBC: 0 % (ref 0.0–0.2)

## 2020-03-16 LAB — PROTIME-INR
INR: 1.1 (ref 0.8–1.2)
Prothrombin Time: 14.1 seconds (ref 11.4–15.2)

## 2020-03-16 LAB — PROCALCITONIN: Procalcitonin: 0.85 ng/mL

## 2020-03-16 LAB — TROPONIN I (HIGH SENSITIVITY)
Troponin I (High Sensitivity): 469 ng/L (ref <18)
Troponin I (High Sensitivity): 412 ng/L (ref <18)
Troponin I (High Sensitivity): 278 ng/L (ref <18)
Troponin I (High Sensitivity): 227 ng/L (ref <18)

## 2020-03-16 LAB — URINALYSIS, COMPLETE (UACMP) WITH MICROSCOPIC
Bilirubin Urine: NEGATIVE
Glucose, UA: NEGATIVE mg/dL
Hgb urine dipstick: NEGATIVE
Ketones, ur: NEGATIVE mg/dL
Leukocytes,Ua: NEGATIVE
Nitrite: POSITIVE — AB
Protein, ur: 100 mg/dL — AB
Specific Gravity, Urine: 1.014 (ref 1.005–1.030)
Squamous Epithelial / HPF: NONE SEEN (ref 0–5)
pH: 8 (ref 5.0–8.0)

## 2020-03-16 LAB — RESPIRATORY PANEL BY RT PCR (FLU A&B, COVID)
Influenza A by PCR: NEGATIVE
Influenza B by PCR: NEGATIVE
SARS Coronavirus 2 by RT PCR: NEGATIVE

## 2020-03-16 LAB — LACTIC ACID, PLASMA
Lactic Acid, Venous: 1.1 mmol/L (ref 0.5–1.9)
Lactic Acid, Venous: 0.7 mmol/L (ref 0.5–1.9)

## 2020-03-16 LAB — BRAIN NATRIURETIC PEPTIDE: B Natriuretic Peptide: 811 pg/mL — ABNORMAL HIGH (ref 0.0–100.0)

## 2020-03-16 LAB — APTT: aPTT: 31 seconds (ref 24–36)

## 2020-03-16 MED ORDER — POLYETHYLENE GLYCOL 3350 17 G PO PACK: 17.0000 g | PACK | Freq: Every day | ORAL | Status: DC | PRN

## 2020-03-16 MED ORDER — SODIUM CHLORIDE 0.9 % IV SOLN: INTRAVENOUS | Status: DC

## 2020-03-16 MED ORDER — ONDANSETRON HCL 4 MG/2ML IJ SOLN: 4.0000 mg | Freq: Three times a day (TID) | INTRAMUSCULAR | Status: DC | PRN

## 2020-03-16 MED ORDER — TAPENTADOL HCL 50 MG PO TABS: 50.0000 mg | ORAL_TABLET | Freq: Three times a day (TID) | ORAL | Status: DC

## 2020-03-16 MED ORDER — ALBUTEROL SULFATE HFA 108 (90 BASE) MCG/ACT IN AERS
2.0000 | INHALATION_SPRAY | RESPIRATORY_TRACT | Status: DC | PRN
  Filled 2020-03-16: qty 6.7

## 2020-03-16 MED ORDER — ACETAMINOPHEN 325 MG PO TABS: 650.0000 mg | ORAL_TABLET | Freq: Four times a day (QID) | ORAL | Status: DC | PRN

## 2020-03-16 MED ORDER — SERTRALINE HCL 50 MG PO TABS
100.0000 mg | ORAL_TABLET | Freq: Every day | ORAL | Status: DC
  Administered 2020-03-17 – 2020-03-18 (×2): 100 mg via ORAL
  Filled 2020-03-16 (×2): qty 2

## 2020-03-16 MED ORDER — MAGNESIUM HYDROXIDE 400 MG/5ML PO SUSP: 30.0000 mL | Freq: Every day | ORAL | Status: DC | PRN

## 2020-03-16 MED ORDER — ASPIRIN 325 MG PO TABS
325.0000 mg | ORAL_TABLET | Freq: Every day | ORAL | Status: DC
  Administered 2020-03-17 – 2020-03-18 (×2): 325 mg via ORAL
  Filled 2020-03-16 (×2): qty 1

## 2020-03-16 MED ORDER — ENOXAPARIN SODIUM 30 MG/0.3ML ~~LOC~~ SOLN
30.0000 mg | SUBCUTANEOUS | Status: DC
  Administered 2020-03-16 – 2020-03-17 (×2): 30 mg via SUBCUTANEOUS
  Filled 2020-03-16 (×2): qty 0.3

## 2020-03-16 MED ORDER — ADULT MULTIVITAMIN W/MINERALS CH
1.0000 | ORAL_TABLET | Freq: Every day | ORAL | Status: DC
  Administered 2020-03-17 – 2020-03-18 (×2): 1 via ORAL
  Filled 2020-03-16 (×2): qty 1

## 2020-03-16 MED ORDER — PANTOPRAZOLE SODIUM 40 MG PO TBEC
40.0000 mg | DELAYED_RELEASE_TABLET | Freq: Every day | ORAL | Status: DC
  Administered 2020-03-17 – 2020-03-18 (×2): 40 mg via ORAL
  Filled 2020-03-16 (×2): qty 1

## 2020-03-16 MED ORDER — DM-GUAIFENESIN ER 30-600 MG PO TB12
1.0000 | ORAL_TABLET | Freq: Two times a day (BID) | ORAL | Status: DC | PRN
  Filled 2020-03-16: qty 1

## 2020-03-16 MED ORDER — SODIUM CHLORIDE 0.9 % IV SOLN
2.0000 g | Freq: Once | INTRAVENOUS | Status: AC
  Administered 2020-03-16: 2 g via INTRAVENOUS
  Filled 2020-03-16: qty 2

## 2020-03-16 MED ORDER — VITAMIN D 25 MCG (1000 UNIT) PO TABS
2000.0000 [IU] | ORAL_TABLET | Freq: Every day | ORAL | Status: DC
  Administered 2020-03-17 – 2020-03-18 (×2): 2000 [IU] via ORAL
  Filled 2020-03-16 (×2): qty 2

## 2020-03-16 MED ORDER — TAPENTADOL HCL 50 MG PO TABS
50.0000 mg | ORAL_TABLET | Freq: Three times a day (TID) | ORAL | Status: DC
  Administered 2020-03-17 – 2020-03-18 (×4): 50 mg via ORAL
  Filled 2020-03-16 (×4): qty 1

## 2020-03-16 MED ORDER — SODIUM CHLORIDE 0.9 % IV BOLUS (SEPSIS)
250.0000 mL | Freq: Once | INTRAVENOUS | Status: AC
  Administered 2020-03-16: 250 mL via INTRAVENOUS

## 2020-03-16 MED ORDER — SODIUM CHLORIDE 0.9 % IV BOLUS (SEPSIS)
1000.0000 mL | Freq: Once | INTRAVENOUS | Status: AC
  Administered 2020-03-16: 1000 mL via INTRAVENOUS

## 2020-03-16 MED ORDER — SODIUM CHLORIDE 0.9 % IV SOLN
1.0000 g | Freq: Two times a day (BID) | INTRAVENOUS | Status: DC
  Administered 2020-03-16 – 2020-03-17 (×3): 1 g via INTRAVENOUS
  Filled 2020-03-16 (×5): qty 1

## 2020-03-16 MED ORDER — LOPERAMIDE HCL 2 MG PO CAPS
2.0000 mg | ORAL_CAPSULE | Freq: Four times a day (QID) | ORAL | Status: DC | PRN
  Filled 2020-03-16: qty 1

## 2020-03-16 MED ORDER — VANCOMYCIN HCL IN DEXTROSE 1-5 GM/200ML-% IV SOLN
1000.0000 mg | Freq: Once | INTRAVENOUS | Status: AC
  Administered 2020-03-16: 1000 mg via INTRAVENOUS
  Filled 2020-03-16: qty 200

## 2020-03-16 MED ORDER — OXYBUTYNIN CHLORIDE 5 MG PO TABS
5.0000 mg | ORAL_TABLET | Freq: Every day | ORAL | Status: DC
  Administered 2020-03-17 – 2020-03-18 (×2): 5 mg via ORAL
  Filled 2020-03-16 (×2): qty 1

## 2020-03-16 MED ORDER — GABAPENTIN 300 MG PO CAPS
300.0000 mg | ORAL_CAPSULE | Freq: Every day | ORAL | Status: DC
  Administered 2020-03-16 – 2020-03-18 (×3): 300 mg via ORAL
  Filled 2020-03-16 (×2): qty 1
  Filled 2020-03-16: qty 3

## 2020-03-16 MED ORDER — NITROGLYCERIN 0.4 MG SL SUBL: 0.4000 mg | SUBLINGUAL_TABLET | SUBLINGUAL | Status: DC | PRN

## 2020-03-16 NOTE — ED Provider Notes (Signed)
[facility] Emergency Department Provider Note ____________________________________________   First MD Initiated Contact with Patient 03/16/20 1401     (approximate)  I have reviewed the triage vital signs and the nursing notes.   HISTORY  Chief Complaint Urinary Tract Infection    HPI Cindy Graham is a 86 y.o. female with PMH as noted below who presents due to fever and abnormal lung sounds on exam by the doctor at her facility today.  The patient was noted have a fever as high as 103.  She has a history of UTIs in the past.  She reports some generalized weakness but denies chest pain, acute shortness of breath, vomiting, diarrhea, or urinary symptoms.  Past Medical History:  Diagnosis Date  . Anxiety   . Arthritis    osteo  . Asthma   . COPD (chronic obstructive pulmonary disease) ([place])   . GERD (gastroesophageal reflux disease)   . Heart murmur   . Hernia   . Hyperlipidemia   . Hypertension   . Melanoma ([place])    face  . Syncope and collapse   . Urine incontinence     Patient Active Problem List   Diagnosis Date Noted  . Displaced intertrochanteric fracture of right femur, initial encounter for closed fracture ([place])   . Acute kidney injury (AKI) with acute tubular necrosis (ATN) ([place]) 02/27/2019  . Stroke (cerebrum) ([place]) 02/22/2019  . Cellulitis 06/12/2018  . Hyponatremia 11/24/2017  . Headache 11/24/2017  . Wound of left leg 11/01/2017  . Constipation 09/05/2017  . Other fatigue 12/22/2016  . Tremor 03/29/2016  . Amaurosis fugax 12/25/2015  . Asthma 09/17/2015  . PVD (peripheral vascular disease) ([place]) 05/28/2015  . B12 deficiency 08/29/2014  . Chronic back pain greater than 3 months duration 04/23/2013  . Chronic kidney disease (CKD), stage III (moderate) ([place]) 04/12/2012  . Hyperlipidemia LDL goal < 100 03/05/2012  . UTI (urinary tract infection) 07/26/2011  . Osteoarthritis 07/06/2011  . Hypertension 02/21/2011  .  Depression/Anxiety 02/21/2011    Past Surgical History:  Procedure Laterality Date  . ABDOMINAL HYSTERECTOMY    . AMPUTATION TOE Right 06/14/2018   Procedure: AMPUTATION TOE - RIGHT 5TH;  Surgeon: Albertine Patricia, DPM;  Location: [facility] ORS;  Service: Podiatry;  Laterality: Right;  . CHOLECYSTECTOMY    . HERNIA REPAIR    . INTRAMEDULLARY (IM) NAIL INTERTROCHANTERIC Right 06/22/2019   Procedure: INTRAMEDULLARY (IM) NAIL INTERTROCHANTRIC;  Surgeon: Leandrew Koyanagi, MD;  Location: [place];  Service: Orthopedics;  Laterality: Right;    Prior to Admission medications   Medication Sig Start Date End Date Taking? Authorizing Provider  acetaminophen (TYLENOL) 325 MG tablet Take 650 mg by mouth 2 (two) times daily.    [provider]  acetaminophen (TYLENOL) 500 MG tablet Take 500 mg by mouth every 6 (six) hours as needed for mild pain or moderate pain.     [provider]  alendronate (FOSAMAX) 70 MG tablet Take 70 mg by mouth every Thursday.     [provider]  alum & mag hydroxide-simeth (MINTOX) 200-200-20 MG/5ML suspension Take 30 mLs by mouth 3 (three) times daily as needed for indigestion or heartburn.     [provider]  ascorbic acid (VITAMIN C) 250 MG tablet Take 500 mg by mouth 2 (two) times daily.    [provider]  aspirin 325 MG tablet Take 1 tablet (325 mg total) by mouth daily. 06/25/19   Regalado, Cassie Freer, MD  Biotin 1000  MCG tablet Take 1,000 mcg by mouth daily.      [provider]  enoxaparin (LOVENOX) 40 MG/0.4ML injection Inject 0.4 mLs (40 mg total) into the skin daily. 06/22/19   Leandrew Koyanagi, MD  feeding supplement, ENSURE ENLIVE, (ENSURE ENLIVE) LIQD Take 237 mLs by mouth 2 (two) times daily between meals. 06/25/19   Regalado, Belkys A, MD  Flaxseed, Linseed, (FLAX SEED OIL) 1000 MG CAPS Take 1,000 mg by mouth daily.     [provider]  fluticasone (FLOVENT HFA) 220 MCG/ACT inhaler Inhale 2 puffs into the lungs 2  (two) times daily. 08/12/17   Leone Haven, MD  gabapentin (NEURONTIN) 300 MG capsule Take 1 capsule (300 mg total) by mouth at bedtime. Patient taking differently: Take 300 mg by mouth daily.  12/06/17   Leone Haven, MD  guaifenesin (ROBITUSSIN) 100 MG/5ML syrup Take 200 mg by mouth every 6 (six) hours as needed for cough.     [provider]  HYDROcodone-acetaminophen (NORCO) 5-325 MG tablet Take 1-2 tablets by mouth 2 (two) times daily as needed. 06/22/19   Leandrew Koyanagi, MD  lactulose (CHRONULAC) 10 GM/15ML solution Take 30 mLs (20 g total) by mouth daily as needed for mild constipation. 12/06/17   Leone Haven, MD  loperamide (IMODIUM) 2 MG capsule Take 2 mg by mouth 4 (four) times daily as needed for diarrhea or loose stools.     [provider]  magnesium hydroxide (MILK OF MAGNESIA) 400 MG/5ML suspension Take 30 mLs by mouth daily as needed for mild constipation.    [provider]  Multiple Vitamin (DAILY VITES) tablet Take 1 tablet by mouth daily.    [provider]  nitroGLYCERIN (NITROSTAT) 0.4 MG SL tablet Place 0.4 mg under the tongue every 5 (five) minutes as needed for chest pain.    [provider]  nystatin cream (MYCOSTATIN) Apply 1 application topically as needed (panniculitis). Apply to pannus    [provider]  omeprazole (PRILOSEC) 20 MG capsule TAKE 1 CAPSULE BY MOUTH EVERY DAY. Patient taking differently: Take 20 mg by mouth daily.  11/15/17   Leone Haven, MD  oxybutynin (DITROPAN) 5 MG tablet TAKE ONE TABLET BY MOUTH EVERY DAY Patient taking differently: Take 5 mg by mouth daily.  09/15/17   Leone Haven, MD  polyethylene glycol (MIRALAX / GLYCOLAX) 17 g packet Take 17 g by mouth daily. 06/26/19   Regalado, Belkys A, MD  propranolol (INDERAL) 40 MG tablet Take 1 tablet (40 mg total) by mouth daily. 06/26/19   Regalado, Belkys A, MD  sertraline (ZOLOFT) 100 MG tablet TAKE ONE TABLET BY MOUTH EVERY  DAY Patient taking differently: Take 100 mg by mouth daily.  11/15/17   Leone Haven, MD  tapentadol (NUCYNTA) 50 MG tablet Take 50 mg by mouth 3 (three) times daily.    [provider]  verapamil (VERELAN PM) 240 MG 24 hr capsule Take 240 mg by mouth at bedtime.    [provider]  Zinc Chelated 50 MG TABS Take 50 mg by mouth daily.    [provider]    Allergies Statins, Tramadol, Levaquin [levofloxacin in d5w], Neomycin-polymyxin-gramicidin, Penicillins, Sulfa antibiotics, and Tetracyclines & related  Family History  Problem Relation Age of Onset  . Stroke Father   . Heart disease Sister   . Cancer Brother        colon  . Heart disease Brother   . Cancer Sister  colon  . Heart attack Brother   . Heart attack Son   . Parkinson's disease Son     Social History Social History   Tobacco Use  . Smoking status: Never Smoker  . Smokeless tobacco: Never Used  Substance Use Topics  . Alcohol use: No  . Drug use: No    Review of Systems  Constitutional: Positive for fever. Eyes: No redness. ENT: No sore throat. Cardiovascular: Denies chest pain. Respiratory: Denies acute shortness of breath. Gastrointestinal: No vomiting or diarrhea.  Genitourinary: Negative for dysuria.  Musculoskeletal: Positive for back pain. Skin: Negative for rash. Neurological: Negative for headache.   ____________________________________________   PHYSICAL EXAM:  VITAL SIGNS: ED Triage Vitals  Enc Vitals Group     BP 03/16/20 1352 (!) 104/44     Pulse Rate 03/16/20 1352 74     Resp 03/16/20 1352 (!) 24     Temp 03/16/20 1352 99.9 F (37.7 C)     Temp Source 03/16/20 1352 Oral     SpO2 03/16/20 1352 97 %     Weight 03/16/20 1348 148 lb (67.1 kg)     Height 03/16/20 1348 5\' 3"  (1.6 m)     Head Circumference --      Peak Flow --      Pain Score 03/16/20 1348 0     Pain Loc --      Pain Edu? --      Excl. in [place]? --     Constitutional: Alert,  oriented x2. Well appearing for age in no acute distress. Eyes: Conjunctivae are normal.  Head: Atraumatic. Nose: No congestion/rhinnorhea. Mouth/Throat: Mucous membranes are dry.   Neck: Normal range of motion.  Cardiovascular: Normal rate, regular rhythm. Grossly normal heart sounds.  Good peripheral circulation. Respiratory: Normal respiratory effort.  No retractions. Lungs CTAB. Gastrointestinal: Soft and nontender. No distention.  Genitourinary: No flank tenderness. Musculoskeletal: No lower extremity edema.  Extremities warm and well perfused.  Neurologic:  Normal speech and language. No gross focal neurologic deficits are appreciated.  Skin:  Skin is warm and dry. No rash noted. Psychiatric: Mood and affect are normal. Speech and behavior are normal.  ____________________________________________   LABS (all labs ordered are listed, but only abnormal results are displayed)  Labs Reviewed  COMPREHENSIVE METABOLIC PANEL - Abnormal; Notable for the following components:      Result Value   Glucose, Bld 108 (*)    BUN 38 (*)    Creatinine, Ser 1.26 (*)    Total Protein 6.1 (*)    Albumin 2.9 (*)    GFR, Estimated 39 (*)    All other components within normal limits  CBC WITH DIFFERENTIAL/PLATELET - Abnormal; Notable for the following components:   WBC 15.1 (*)    RBC 3.65 (*)    Hemoglobin 11.7 (*)    HCT 35.1 (*)    Neutro Abs 11.1 (*)    Monocytes Absolute 1.7 (*)    Abs Immature Granulocytes 0.10 (*)    All other components within normal limits  TROPONIN I (HIGH SENSITIVITY) - Abnormal; Notable for the following components:   Troponin I (High Sensitivity) 469 (*)    All other components within normal limits  CULTURE, BLOOD (ROUTINE X 2)  CULTURE, BLOOD (ROUTINE X 2)  URINE CULTURE  RESPIRATORY PANEL BY RT PCR (FLU A&B, COVID)  LACTIC ACID, PLASMA  LACTIC ACID, PLASMA  URINALYSIS, COMPLETE (UACMP) WITH MICROSCOPIC  TROPONIN I (HIGH SENSITIVITY)    ____________________________________________  EKG  ED ECG REPORT I, Arta Silence, the attending physician, personally viewed and interpreted this ECG.  Date: 03/16/2020 EKG Time: 1444 Rate: 67 Rhythm: normal sinus rhythm QRS Axis: normal Intervals: normal ST/T Wave abnormalities: Nonspecific T wave abnormalities Narrative Interpretation: no evidence of acute ischemia   ____________________________________________  RADIOLOGY  CXR interpreted by me shows no focal infiltrate or edema  ____________________________________________   PROCEDURES  Procedure(s) performed: No  Procedures  Critical Care performed: No ____________________________________________   INITIAL IMPRESSION / ASSESSMENT AND PLAN / ED COURSE  Pertinent labs & imaging results that were available during my care of the patient were reviewed by me and considered in my medical decision making (see chart for details).  86 year old female with PMH as noted above presents with fever and generalized weakness noted today.  In addition the doctor at her facility noted her to have abnormal breath sounds and she was hypoxic to the 80s.  She denies any acute shortness of breath or cough.  I reviewed the past medical records in [place].  She was most recently admitted in February for spinal fractures after a fall.  Previously she was admitted in 2020 with AKI.  On exam today, the patient is somewhat weak and frail but overall relatively well-appearing for her age.  She is somewhat hypotensive with a borderline elevated temperature and otherwise normal vital signs.  O2 saturation is in the midline he is on 3 L O2 by nasal cannula although at baseline she is not on oxygen.  Lungs are clear on my exam.  Neurologic exam is nonfocal.  Abdomen soft nontender.  Exam is otherwise unremarkable.  Overall presentation is most consistent with infection/sepsis either from pulmonary source or possible UTI.  I have activated code  sepsis.  We will give fluids, broad-spectrum antibiotics, obtain lab work-up, and reassess.  I anticipate admission.  ----------------------------------------- 3:52 PM on 03/16/2020 -----------------------------------------  Urinalysis is still pending.  Chest x-ray shows no focal infiltrate.  Overall I suspect most likely urosepsis.  The patient's blood pressure is improving with fluids.  Troponin is elevated although this is consistent with demand ischemia.  The patient has no chest pain, and EKG shows no acute abnormality.  Broad-spectrum antibiotics have been initiated.  I discussed the case with Dr. Blaine Hamper from the hospitalist service.  ____________________________________________   FINAL CLINICAL IMPRESSION(S) / ED DIAGNOSES  Final diagnoses:  Sepsis, due to unspecified organism, unspecified whether acute organ dysfunction present [facility])      NEW MEDICATIONS STARTED DURING THIS VISIT:  New Prescriptions   No medications on file     Note:  This document was prepared using Dragon voice recognition software and may include unintentional dictation errors.   Arta Silence, MD 03/16/20 1553

## 2020-03-16 NOTE — H&P (Addendum)
History and Physical    [name] ERD:[id number] DOB: 01/07/1922 DOA: 03/16/2020  Referring MD/NP/PA:   PCP: [facility]   Patient coming from:  The patient is coming from ALF.  At baseline, pt is dependent for most of ADL.        Chief Complaint: fever, dysuria   HPI: Cindy Graham is a 86 y.o. female with medical history significant of hypertension, hyperlipidemia, COPD, asthma, stroke, GERD, depression, anxiety, melanoma, syncope, urinary incontinence, CKD stage III, tremor, UTI, ESBL Klebisella pneumoniae in urine, who presents with fever and generalized weakness.  Patient states that she has been having fever and chills since yesterday.  She has dysuria, burning on urination and increased urinary frequency.  No hematuria.  Patient has generalized weakness.  Denies any chest pain, shortness breath, cough.  No nausea, vomiting, diarrhea, abdominal pain, symptoms of UTI,  No unilateral numbness or tingling his extremities. Of note, pt is wearing C-collar due to previous fracture of posterior arch of C1 and right C1 and C2 transverse processes and transverse foramina.   Patient was initially hypotensive with blood pressure 82/44, which improved to 98/41 after giving 2.25 L of normal saline in ED.  ED Course: pt was found to have urinalysis (cloudy appearance, positive nitrite, rare bacteria, negative leukocyte, WBC 0-5), WBC 15.1, troponin 469, lactic acid 1.1, worsening renal function, current blood pressure 98/41, heart rate 73, RR 24, oxygen saturation 95%, body temperature 99.9, chest x-ray negative for infiltration, but showed bilateral basilar atelectasis.  Patient is placed on progressive bed for observation.  Review of Systems:   General: has fevers, chills, no body weight gain, has fatigue HEENT: no blurry vision, hearing changes or sore throat Respiratory: no dyspnea, coughing, wheezing CV: no chest pain, no palpitations GI: no nausea, vomiting, abdominal  pain, diarrhea, constipation GU: has dysuria, burning on urination, increased urinary frequency, no hematuria  Ext: no leg edema Neuro: no unilateral weakness, numbness, or tingling, no vision change or hearing loss Skin: no rash, no skin tear. MSK: No muscle spasm, no deformity, no limitation of range of movement in spin Heme: No easy bruising.  Travel history: No recent long distant travel.  Allergy:  Allergies  Allergen Reactions   Statins Other (See Comments)    Very bad muscle aches   Tramadol Itching   Levaquin [Levofloxacin In D5w] Diarrhea and Other (See Comments)    Confusion, hallucinations   Neomycin-Polymyxin-Gramicidin Other (See Comments)   Penicillins     Did it involve swelling of the face/tongue/throat, SOB, or low BP? Unknown Did it involve sudden or severe rash/hives, skin peeling, or any reaction on the inside of your mouth or nose? Unknown Did you need to seek medical attention at a hospital or doctor's office? Unknown When did it last happen?Unknown If all above answers are NO, may proceed with cephalosporin use.   Sulfa Antibiotics    Tetracyclines & Related     Past Medical History:  Diagnosis Date   Anxiety    Arthritis    osteo   Asthma    COPD (chronic obstructive pulmonary disease) (HCC)    GERD (gastroesophageal reflux disease)    Heart murmur    Hernia    Hyperlipidemia    Hypertension    Melanoma ([place])    face   Syncope and collapse    Urine incontinence     Past Surgical History:  Procedure Laterality Date   ABDOMINAL HYSTERECTOMY     AMPUTATION TOE Right  06/14/2018   Procedure: AMPUTATION TOE - RIGHT 5TH;  Surgeon: Albertine Patricia, DPM;  Location: [facility];  Service: Podiatry;  Laterality: Right;   CHOLECYSTECTOMY     HERNIA REPAIR     INTRAMEDULLARY (IM) NAIL INTERTROCHANTERIC Right 06/22/2019   Procedure: INTRAMEDULLARY (IM) NAIL INTERTROCHANTRIC;  Surgeon: Leandrew Koyanagi, MD;  Location: [facility];  Service:  Orthopedics;  Laterality: Right;    Social History:  reports that she has never smoked. She has never used smokeless tobacco. She reports that she does not drink alcohol and does not use drugs.  Family History:  Family History  Problem Relation Age of Onset   Stroke Father    Heart disease Sister    Cancer Brother        colon   Heart disease Brother    Cancer Sister        colon   Heart attack Brother    Heart attack Son    Parkinson's disease Son      Prior to Admission medications   Medication Sig Start Date End Date Taking? Authorizing Provider  acetaminophen (TYLENOL) 325 MG tablet Take 650 mg by mouth 2 (two) times daily.    [provider]  acetaminophen (TYLENOL) 500 MG tablet Take 500 mg by mouth every 6 (six) hours as needed for mild pain or moderate pain.     [provider]  alendronate (FOSAMAX) 70 MG tablet Take 70 mg by mouth every Thursday.     [provider]  alum & mag hydroxide-simeth (MINTOX) 200-200-20 MG/5ML suspension Take 30 mLs by mouth 3 (three) times daily as needed for indigestion or heartburn.     [provider]  ascorbic acid (VITAMIN C) 250 MG tablet Take 500 mg by mouth 2 (two) times daily.    [provider]  aspirin 325 MG tablet Take 1 tablet (325 mg total) by mouth daily. 06/25/19   Regalado, Belkys A, MD  Biotin 1000 MCG tablet Take 1,000 mcg by mouth daily.      [provider]  enoxaparin (LOVENOX) 40 MG/0.4ML injection Inject 0.4 mLs (40 mg total) into the skin daily. 06/22/19   Leandrew Koyanagi, MD  feeding supplement, ENSURE ENLIVE, (ENSURE ENLIVE) LIQD Take 237 mLs by mouth 2 (two) times daily between meals. 06/25/19   Regalado, Belkys A, MD  Flaxseed, Linseed, (FLAX SEED OIL) 1000 MG CAPS Take 1,000 mg by mouth daily.     [provider]  fluticasone (FLOVENT HFA) 220 MCG/ACT inhaler Inhale 2 puffs into the lungs 2 (two) times daily. 08/12/17   Leone Haven, MD    gabapentin (NEURONTIN) 300 MG capsule Take 1 capsule (300 mg total) by mouth at bedtime. Patient taking differently: Take 300 mg by mouth daily.  12/06/17   Leone Haven, MD  guaifenesin (ROBITUSSIN) 100 MG/5ML syrup Take 200 mg by mouth every 6 (six) hours as needed for cough.     [provider]  HYDROcodone-acetaminophen (NORCO) 5-325 MG tablet Take 1-2 tablets by mouth 2 (two) times daily as needed. 06/22/19   Leandrew Koyanagi, MD  lactulose (CHRONULAC) 10 GM/15ML solution Take 30 mLs (20 g total) by mouth daily as needed for mild constipation. 12/06/17   Leone Haven, MD  loperamide (IMODIUM) 2 MG capsule Take 2 mg by mouth 4 (four) times daily as needed for diarrhea or loose stools.     [provider]  magnesium hydroxide (MILK OF MAGNESIA) 400 MG/5ML suspension Take 30  mLs by mouth daily as needed for mild constipation.    [provider]  Multiple Vitamin (DAILY VITES) tablet Take 1 tablet by mouth daily.    [provider]  nitroGLYCERIN (NITROSTAT) 0.4 MG SL tablet Place 0.4 mg under the tongue every 5 (five) minutes as needed for chest pain.    [provider]  nystatin cream (MYCOSTATIN) Apply 1 application topically as needed (panniculitis). Apply to pannus    [provider]  omeprazole (PRILOSEC) 20 MG capsule TAKE 1 CAPSULE BY MOUTH EVERY DAY. Patient taking differently: Take 20 mg by mouth daily.  11/15/17   Leone Haven, MD  oxybutynin (DITROPAN) 5 MG tablet TAKE ONE TABLET BY MOUTH EVERY DAY Patient taking differently: Take 5 mg by mouth daily.  09/15/17   Leone Haven, MD  polyethylene glycol (MIRALAX / GLYCOLAX) 17 g packet Take 17 g by mouth daily. 06/26/19   Regalado, Belkys A, MD  propranolol (INDERAL) 40 MG tablet Take 1 tablet (40 mg total) by mouth daily. 06/26/19   Regalado, Belkys A, MD  sertraline (ZOLOFT) 100 MG tablet TAKE ONE TABLET BY MOUTH EVERY DAY Patient taking differently: Take 100 mg by mouth  daily.  11/15/17   Leone Haven, MD  tapentadol (NUCYNTA) 50 MG tablet Take 50 mg by mouth 3 (three) times daily.    [provider]  verapamil (VERELAN PM) 240 MG 24 hr capsule Take 240 mg by mouth at bedtime.    [provider]  Zinc Chelated 50 MG TABS Take 50 mg by mouth daily.    [provider]    Physical Exam: Vitals:   03/16/20 1615 03/16/20 1630 03/16/20 1645 03/16/20 1746  BP: (!) 100/42 (!) 105/46 (!) 118/44 (!) 134/58  Pulse: (!) 55 (!) 56 (!) 57 (!) 58  Resp: 13 12 13 15   Temp:    (!) 97.5 F (36.4 C)  TempSrc:    Oral  SpO2: 99% 100% 100% 100%  Weight:      Height:       General: Not in acute distress HEENT:       Eyes: PERRL, EOMI, no scleral icterus.       ENT: No discharge from the ears and nose, no pharynx injection, no tonsillar enlargement.        Neck: No JVD, no bruit, no mass felt. Heme: No neck lymph node enlargement. Cardiac: S1/S2, RRR, No gallops or rubs. Respiratory:  No rales, wheezing, rhonchi or rubs. GI: Soft, nondistended, nontender, no rebound pain, no organomegaly, BS present. GU: No hematuria Ext: No pitting leg edema bilaterally. 1+DP/PT pulse bilaterally. Musculoskeletal: No joint deformities, No joint redness or warmth Skin: No rashes.  Neuro: Alert, oriented X3, cranial nerves II-XII grossly intact, moves all extremities normally.   Psych: Patient is not psychotic, no suicidal or hemocidal ideation.  Labs on Admission: I have personally reviewed following labs and imaging studies  CBC: Recent Labs  Lab 03/16/20 1429  WBC 15.1*  NEUTROABS 11.1*  HGB 11.7*  HCT 35.1*  MCV 96.2  PLT 063   Basic Metabolic Panel: Recent Labs  Lab 03/16/20 1429  NA 138  K 4.0  CL 102  CO2 27  GLUCOSE 108*  BUN 38*  CREATININE 1.26*  CALCIUM 8.9   GFR: Estimated Creatinine Clearance: 24 mL/min (A) (by C-G formula based on SCr of 1.26 mg/dL (H)). Liver Function Tests: Recent Labs  Lab 03/16/20 1429    AST 30  ALT 19  ALKPHOS 57  BILITOT 1.2  PROT 6.1*  ALBUMIN 2.9*   No results for input(s): LIPASE, AMYLASE in the last 168 hours. No results for input(s): AMMONIA in the last 168 hours. Coagulation Profile: Recent Labs  Lab 03/16/20 1631  INR 1.1   Cardiac Enzymes: No results for input(s): CKTOTAL, CKMB, CKMBINDEX, TROPONINI in the last 168 hours. BNP (last 3 results) No results for input(s): PROBNP in the last 8760 hours. HbA1C: No results for input(s): HGBA1C in the last 72 hours. CBG: No results for input(s): GLUCAP in the last 168 hours. Lipid Profile: No results for input(s): CHOL, HDL, LDLCALC, TRIG, CHOLHDL, LDLDIRECT in the last 72 hours. Thyroid Function Tests: No results for input(s): TSH, T4TOTAL, FREET4, T3FREE, THYROIDAB in the last 72 hours. Anemia Panel: No results for input(s): VITAMINB12, FOLATE, FERRITIN, TIBC, IRON, RETICCTPCT in the last 72 hours. Urine analysis:    Component Value Date/Time   COLORURINE AMBER (A) 03/16/2020 1539   APPEARANCEUR CLOUDY (A) 03/16/2020 1539   APPEARANCEUR Cloudy (A) 11/14/2017 1411   LABSPEC 1.014 03/16/2020 1539   LABSPEC 1.018 06/01/2013 0933   PHURINE 8.0 03/16/2020 1539   GLUCOSEU NEGATIVE 03/16/2020 1539   GLUCOSEU NEGATIVE 06/17/2014 1123   HGBUR NEGATIVE 03/16/2020 1539   BILIRUBINUR NEGATIVE 03/16/2020 1539   BILIRUBINUR small 11/28/2017 1207   BILIRUBINUR Negative 11/14/2017 1411   BILIRUBINUR Negative 06/01/2013 0933   KETONESUR NEGATIVE 03/16/2020 1539   PROTEINUR 100 (A) 03/16/2020 1539   UROBILINOGEN 0.2 11/28/2017 1207   UROBILINOGEN 0.2 06/17/2014 1123   NITRITE POSITIVE (A) 03/16/2020 1539   LEUKOCYTESUR NEGATIVE 03/16/2020 1539   LEUKOCYTESUR 2+ 06/01/2013 0933   Sepsis Labs: @LABRCNTIP (procalcitonin:4,lacticidven:4) ) Recent Results (from the past 240 hour(s))  Respiratory Panel by RT PCR (Flu A&B, Covid) - Nasopharyngeal Swab     Status: None   Collection Time: 03/16/20  2:44 PM    Specimen: Nasopharyngeal Swab  Result Value Ref Range Status   SARS Coronavirus 2 by RT PCR NEGATIVE NEGATIVE Final    Comment: (NOTE) SARS-CoV-2 target nucleic acids are NOT DETECTED.  The SARS-CoV-2 RNA is generally detectable in upper respiratoy specimens during the acute phase of infection. The lowest concentration of SARS-CoV-2 viral copies this assay can detect is 131 copies/mL. A negative result does not preclude SARS-Cov-2 infection and should not be used as the sole basis for treatment or other patient management decisions. A negative result may occur with  improper specimen collection/handling, submission of specimen other than nasopharyngeal swab, presence of viral mutation(s) within the areas targeted by this assay, and inadequate number of viral copies (<131 copies/mL). A negative result must be combined with clinical observations, patient history, and epidemiological information. The expected result is Negative.  Fact Sheet for Patients:  PinkCheek.be  Fact Sheet for Healthcare Providers:  GravelBags.it  This test is no t yet approved or cleared by the [place] FDA and  has been authorized for detection and/or diagnosis of SARS-CoV-2 by FDA under an Emergency Use Authorization (EUA). This EUA will remain  in effect (meaning this test can be used) for the duration of the COVID-19 declaration under Section 564(b)(1) of the Act, 21 U.S.C. section 360bbb-3(b)(1), unless the authorization is terminated or revoked sooner.     Influenza A by PCR NEGATIVE NEGATIVE Final   Influenza B by PCR NEGATIVE NEGATIVE Final    Comment: (NOTE) The Xpert Xpress SARS-CoV-2/FLU/RSV assay is intended as an aid in  the diagnosis of influenza from Nasopharyngeal swab specimens and  should not be used as a sole basis for treatment. Nasal washings and  aspirates are unacceptable for Xpert Xpress SARS-CoV-2/FLU/RSV   testing.  Fact Sheet for Patients: PinkCheek.be  Fact Sheet for Healthcare Providers: GravelBags.it  This test is not yet approved or cleared by the [place] FDA and  has been authorized for detection and/or diagnosis of SARS-CoV-2 by  FDA under an Emergency Use Authorization (EUA). This EUA will remain  in effect (meaning this test can be used) for the duration of the  Covid-19 declaration under Section 564(b)(1) of the Act, 21  U.S.C. section 360bbb-3(b)(1), unless the authorization is  terminated or revoked. Performed at [facility], [address] [place], [place] [address]      Radiological Exams on Admission: DG Chest [facility] 1 View  Result Date: 03/16/2020 CLINICAL DATA:  Sepsis, UTI EXAM: PORTABLE CHEST 1 VIEW COMPARISON:  06/21/2019 flu FINDINGS: The heart size and mediastinal contours are stable. Atherosclerotic calcification of the aortic knob. Mild bibasilar atelectasis. Lungs appear otherwise clear. No pleural effusion or pneumothorax. Patient is wearing a cervical collar. There are degenerative changes of the spine and bilateral shoulders. IMPRESSION: Mild bibasilar atelectasis. Otherwise no acute cardiopulmonary process. Electronically Signed   By: Davina Poke D.O.   On: 03/16/2020 14:42     EKG: I have personally reviewed.  Sinus rhythm, QTC 472, old Q wave in lead III/aVF, low voltage.  Assessment/Plan Principal Problem:   Severe sepsis (HCC) Active Problems:   Hypertension   Depression/Anxiety   UTI (urinary tract infection)   Acute renal failure superimposed on stage 3a chronic kidney disease (HCC)   Stroke (cerebrum) (HCC)   COPD (chronic obstructive pulmonary disease) (HCC)   Elevated troponin   Severe sepsis due to UTI: Patient has possible UTI.  Her urinalysis showed positive nitrite and cloudy appearance, patient has typical symptoms of UTI.  Patient meets criteria for  severe sepsis with leukocytosis and tachypnea, hypotension.  Lactic acid is normal.  Currently hemodynamically stable.  Blood pressure responded to IV fluid.  -will place on Med-surg bed for obs -IV Meropenum (patient received 1 dose of vancomycin and cefepime by ED) -Follow-up blood culture and urine culture -will get Procalcitonin -IVF: 2.25 L of NS bolus in ED, followed by 75 cc/h   Hypertension -Hold blood pressure medications (enalapril, propranolol, verapamil) due to hypotension  Acute renal failure superimposed on stage 3a chronic kidney disease ([place]): Baseline creatinine 1.0.  Her creatinine is 1.26, BUN 38, likely due to UTI.  ATN is also possible due to hypotension. -Avoid using renal toxic medications. -Enalapril is on hold -IV fluid as above -Follow-up with BMP  Depression/Anxiety -Continue home medications  Stroke (cerebrum) (HCC) -Continue aspirin  COPD (chronic obstructive pulmonary disease) (HCC) --As needed bronchodilators, albuterol  Elevated troponin: no CP. Troponin 469 --> 412, trending down.  Consulted Dr. Fletcher Anon of cardiology.  Per Dr. Fletcher Anon, patient most likely has demand ischemia, he recommended to trend troponin and obtain 2D echo.  If troponin is trending up or has abnormal 2D echo, they will see patient tomorrow. -prn NTG -Aspirin -Trend troponin -Check A1c, FLP -Repeat EKG in morning -Follow-up 2D echo   DVT ppx: SQ Lovenox Code Status: DNR per pt which is confirmed by her daughter-in-law Family Communication: Yes, patient's daughter-in-law by phone   Disposition Plan:  Anticipate discharge back to previous environment Consults called:  Dr. Jacklynn Ganong Admission status: Med-surg bed for obs     Status is: Observation  The patient  remains OBS appropriate and will d/c before 2 midnights.  Dispo: The patient is from: ALF              Anticipated d/c is to: ALF              Anticipated d/c date is: 1 day              Patient currently is not  medically stable to d/c.             Date of Service 03/16/2020    Ivor Costa [facility]   If 7PM-7AM, please contact night-coverage [email or web address] 03/16/2020, 6:03 PM

## 2020-03-16 NOTE — Consult Note (Signed)
Pharmacy Antibiotic Note  Cindy Graham is a 86 y.o. female with medical history including CKD stage III, urinary incontinence, stroke, COPD/asthma admitted on 03/16/2020 with urosepsis. Patient has a history of ESBL Klebisella pneumoniae in urine. Pharmacy has been consulted for meropenem dosing.  Plan: Meropenem 1 g q12h  Continue to follow up culture results and ability to narrow antibiotic as able. Monitor renal function and adjust antibiotic dosing as appropriate.   Height: 5\' 3"  (160 cm) Weight: 67.1 kg (148 lb) IBW/kg (Calculated) : 52.4  Temp (24hrs), Avg:98.7 F (37.1 C), Min:97.5 F (36.4 C), Max:99.9 F (37.7 C)  Recent Labs  Lab 03/16/20 1429 03/16/20 1631  WBC 15.1*  --   CREATININE 1.26*  --   LATICACIDVEN 1.1 0.7    Estimated Creatinine Clearance: 24 mL/min (A) (by C-G formula based on SCr of 1.26 mg/dL (H)).    Allergies  Allergen Reactions  . Statins Other (See Comments)    Very bad muscle aches  . Tramadol Itching  . Levaquin [Levofloxacin In D5w] Diarrhea and Other (See Comments)    Confusion, hallucinations  . Neomycin-Polymyxin-Gramicidin Other (See Comments)  . Penicillins     Did it involve swelling of the face/tongue/throat, SOB, or low BP? Unknown Did it involve sudden or severe rash/hives, skin peeling, or any reaction on the inside of your mouth or nose? Unknown Did you need to seek medical attention at a hospital or doctor's office? Unknown When did it last happen?Unknown If all above answers are "NO", may proceed with cephalosporin use.  . Sulfa Antibiotics   . Tetracyclines & Related     Antimicrobials this admission: Vancomycin 10/25 x 1 Cefepime 10/25 x 1 Meropenem 10/25 >>  Dose adjustments this admission: N/A  Microbiology results: 10/25 BCx: pending 10/25 UCx: pending   Thank you for allowing pharmacy to be a part of this patient's care.  Benita Gutter 03/16/2020 7:18 PM

## 2020-03-16 NOTE — Progress Notes (Signed)
PHARMACIST - PHYSICIAN COMMUNICATION  CONCERNING:  Enoxaparin (Lovenox) for DVT Prophylaxis    RECOMMENDATION: Patient was prescribed enoxaparin 40mg  q24 hours for VTE prophylaxis.   Filed Weights   03/16/20 1348  Weight: 67.1 kg (148 lb)    Body mass index is 26.22 kg/m.  Estimated Creatinine Clearance: 24 mL/min (A) (by C-G formula based on SCr of 1.26 mg/dL (H)).  Patient is candidate for enoxaparin 30mg  every 24 hours based on CrCl <75ml/min or Weight <45kg  DESCRIPTION: Pharmacy has adjusted enoxaparin dose per [facility] policy.  Patient is now receiving enoxaparin 30 mg every 24 hours    Benita Gutter 03/16/2020 7:53 PM

## 2020-03-16 NOTE — ED Triage Notes (Signed)
Sent from SNF for UTI eval

## 2020-03-16 NOTE — Progress Notes (Signed)
PHARMACY -  BRIEF ANTIBIOTIC NOTE   Pharmacy has received consult(s) for Cefepime and vancomycin from an ED provider.  The patient's profile has been reviewed for ht/wt/allergies/indication/available labs.    One time order(s) placed for  Vancomycin 1g and Cefepime 2g   Further antibiotics/pharmacy consults should be ordered by admitting physician if indicated.                       Thank you, Pernell Dupre, PharmD, BCPS Clinical Pharmacist 03/16/2020 2:51 PM

## 2020-03-16 NOTE — Progress Notes (Signed)
CODE SEPSIS - PHARMACY COMMUNICATION  **Broad Spectrum Antibiotics should be administered within 1 hour of Sepsis diagnosis**  Time Code Sepsis Called/Page Received: 1403  Antibiotics Ordered: Cefepime and vancomycin   Time of 1st antibiotic administration: @ 1453  Additional action taken by pharmacy: Contacted MD about patient being Code sepsis and not having Abx orders. Contacted RN @ 1445 about time left to start abx for Code Sepsis   If necessary, Name of Provider/Nurse Contacted: Dr. Mamie Levers, PharmD, BCPS Clinical Pharmacist 03/16/2020 2:50 PM

## 2020-03-17 ENCOUNTER — Observation Stay: Payer: Medicare Other

## 2020-03-17 ENCOUNTER — Encounter: Payer: Self-pay | Admitting: Internal Medicine

## 2020-03-17 ENCOUNTER — Observation Stay
Admit: 2020-03-17 | Discharge: 2020-03-17 | Disposition: A | Discharge status: Home / Self Care | Payer: Medicare Other | Attending: Internal Medicine | Admitting: Internal Medicine

## 2020-03-17 DIAGNOSIS — K219 Gastro-esophageal reflux disease without esophagitis: Secondary | ICD-10-CM | POA: Diagnosis present

## 2020-03-17 DIAGNOSIS — N3000 Acute cystitis without hematuria: Secondary | ICD-10-CM | POA: Diagnosis not present

## 2020-03-17 DIAGNOSIS — Z66 Do not resuscitate: Secondary | ICD-10-CM | POA: Diagnosis present

## 2020-03-17 DIAGNOSIS — Z823 Family history of stroke: Secondary | ICD-10-CM | POA: Diagnosis not present

## 2020-03-17 DIAGNOSIS — W1830XD Fall on same level, unspecified, subsequent encounter: Secondary | ICD-10-CM | POA: Diagnosis not present

## 2020-03-17 DIAGNOSIS — R6520 Severe sepsis without septic shock: Secondary | ICD-10-CM | POA: Diagnosis present

## 2020-03-17 DIAGNOSIS — F419 Anxiety disorder, unspecified: Secondary | ICD-10-CM | POA: Diagnosis present

## 2020-03-17 DIAGNOSIS — Z20822 Contact with and (suspected) exposure to covid-19: Secondary | ICD-10-CM | POA: Diagnosis present

## 2020-03-17 DIAGNOSIS — N179 Acute kidney failure, unspecified: Secondary | ICD-10-CM | POA: Diagnosis not present

## 2020-03-17 DIAGNOSIS — I129 Hypertensive chronic kidney disease with stage 1 through stage 4 chronic kidney disease, or unspecified chronic kidney disease: Secondary | ICD-10-CM | POA: Diagnosis present

## 2020-03-17 DIAGNOSIS — F32A Depression, unspecified: Secondary | ICD-10-CM | POA: Diagnosis present

## 2020-03-17 DIAGNOSIS — R32 Unspecified urinary incontinence: Secondary | ICD-10-CM | POA: Diagnosis present

## 2020-03-17 DIAGNOSIS — S12100D Unspecified displaced fracture of second cervical vertebra, subsequent encounter for fracture with routine healing: Secondary | ICD-10-CM | POA: Diagnosis not present

## 2020-03-17 DIAGNOSIS — N1831 Chronic kidney disease, stage 3a: Secondary | ICD-10-CM | POA: Diagnosis present

## 2020-03-17 DIAGNOSIS — Z90710 Acquired absence of both cervix and uterus: Secondary | ICD-10-CM | POA: Diagnosis not present

## 2020-03-17 DIAGNOSIS — G8929 Other chronic pain: Secondary | ICD-10-CM | POA: Diagnosis present

## 2020-03-17 DIAGNOSIS — A419 Sepsis, unspecified organism: Secondary | ICD-10-CM | POA: Diagnosis not present

## 2020-03-17 DIAGNOSIS — R296 Repeated falls: Secondary | ICD-10-CM | POA: Diagnosis present

## 2020-03-17 DIAGNOSIS — I739 Peripheral vascular disease, unspecified: Secondary | ICD-10-CM | POA: Diagnosis present

## 2020-03-17 DIAGNOSIS — I248 Other forms of acute ischemic heart disease: Secondary | ICD-10-CM | POA: Diagnosis present

## 2020-03-17 DIAGNOSIS — E785 Hyperlipidemia, unspecified: Secondary | ICD-10-CM | POA: Diagnosis present

## 2020-03-17 DIAGNOSIS — R000 Tachycardia, unspecified: Secondary | ICD-10-CM | POA: Diagnosis not present

## 2020-03-17 DIAGNOSIS — A4151 Sepsis due to Escherichia coli [E. coli]: Secondary | ICD-10-CM | POA: Diagnosis present

## 2020-03-17 DIAGNOSIS — N390 Urinary tract infection, site not specified: Secondary | ICD-10-CM | POA: Diagnosis present

## 2020-03-17 DIAGNOSIS — I480 Paroxysmal atrial fibrillation: Secondary | ICD-10-CM | POA: Diagnosis present

## 2020-03-17 DIAGNOSIS — H9190 Unspecified hearing loss, unspecified ear: Secondary | ICD-10-CM | POA: Diagnosis present

## 2020-03-17 DIAGNOSIS — J449 Chronic obstructive pulmonary disease, unspecified: Secondary | ICD-10-CM | POA: Diagnosis not present

## 2020-03-17 DIAGNOSIS — Z8673 Personal history of transient ischemic attack (TIA), and cerebral infarction without residual deficits: Secondary | ICD-10-CM | POA: Diagnosis not present

## 2020-03-17 LAB — LIPID PANEL
Cholesterol: 128 mg/dL (ref 0–200)
HDL: 37 mg/dL — ABNORMAL LOW (ref >40)
LDL Cholesterol: 75 mg/dL (ref 0–99)
Total CHOL/HDL Ratio: 3.5 RATIO
Triglycerides: 82 mg/dL (ref <150)
VLDL: 16 mg/dL (ref 0–40)

## 2020-03-17 LAB — CBC
HCT: 33.5 % — ABNORMAL LOW (ref 36.0–46.0)
Hemoglobin: 10.9 g/dL — ABNORMAL LOW (ref 12.0–15.0)
MCH: 32 pg (ref 26.0–34.0)
MCHC: 32.5 g/dL (ref 30.0–36.0)
MCV: 98.2 fL (ref 80.0–100.0)
Platelets: 138 10*3/uL — ABNORMAL LOW (ref 150–400)
RBC: 3.41 MIL/uL — ABNORMAL LOW (ref 3.87–5.11)
RDW: 14.3 % (ref 11.5–15.5)
WBC: 10.1 10*3/uL (ref 4.0–10.5)
nRBC: 0 % (ref 0.0–0.2)

## 2020-03-17 LAB — BASIC METABOLIC PANEL
Anion gap: 9 (ref 5–15)
BUN: 35 mg/dL — ABNORMAL HIGH (ref 8–23)
CO2: 23 mmol/L (ref 22–32)
Calcium: 8.1 mg/dL — ABNORMAL LOW (ref 8.9–10.3)
Chloride: 106 mmol/L (ref 98–111)
Creatinine, Ser: 1.07 mg/dL — ABNORMAL HIGH (ref 0.44–1.00)
GFR, Estimated: 48 mL/min — ABNORMAL LOW (ref >60)
Glucose, Bld: 100 mg/dL — ABNORMAL HIGH (ref 70–99)
Potassium: 3.8 mmol/L (ref 3.5–5.1)
Sodium: 138 mmol/L (ref 135–145)

## 2020-03-17 LAB — ECHOCARDIOGRAM COMPLETE
AR max vel: 2.59 cm2
AV Area VTI: 3.7 cm2
AV Area mean vel: 2.76 cm2
AV Mean grad: 3.5 mmHg
AV Peak grad: 6 mmHg
Ao pk vel: 1.23 m/s
Area-P 1/2: 3.12 cm2
Height: 63 in
S' Lateral: 2.54 cm
Weight: 2368 oz

## 2020-03-17 LAB — HEMOGLOBIN A1C
Hgb A1c MFr Bld: 5.5 % (ref 4.8–5.6)
Mean Plasma Glucose: 111 mg/dL

## 2020-03-17 MED ORDER — PROPRANOLOL HCL 20 MG PO TABS
40.0000 mg | ORAL_TABLET | Freq: Every day | ORAL | Status: DC
  Administered 2020-03-18: 40 mg via ORAL
  Filled 2020-03-17: qty 2

## 2020-03-17 MED ORDER — VERAPAMIL HCL ER 240 MG PO TBCR
240.0000 mg | EXTENDED_RELEASE_TABLET | Freq: Every day | ORAL | Status: DC
  Administered 2020-03-17: 240 mg via ORAL
  Filled 2020-03-17 (×2): qty 1

## 2020-03-17 MED ORDER — ENALAPRIL MALEATE 20 MG PO TABS
20.0000 mg | ORAL_TABLET | Freq: Every day | ORAL | Status: DC
  Administered 2020-03-17 – 2020-03-18 (×2): 20 mg via ORAL
  Filled 2020-03-17 (×2): qty 1

## 2020-03-17 MED ORDER — ENSURE ENLIVE PO LIQD
237.0000 mL | Freq: Two times a day (BID) | ORAL | Status: DC
  Administered 2020-03-17 – 2020-03-18 (×2): 237 mL via ORAL

## 2020-03-17 NOTE — Evaluation (Signed)
Physical Therapy Evaluation Patient Details Name: CANDIA KINGSBURY MRN: [id number] DOB: 1921-09-26 Today's Date: 03/17/2020   History of Present Illness   Patient is a 86 y.o. female with medical history significant of hypertension, hyperlipidemia, COPD, asthma, stroke, GERD, depression, anxiety, melanoma, syncope, urinary incontinence, CKD stage III, tremor, UTI, ESBL Klebisella pneumoniae in urine, who presents with fever and generalized weakness. History of C-collar due to previous fracture of posterior arch of C1 and right C1 and C2 transverse processes and transverse foramina.     Clinical Impression  PT evaluation completed. Patient is reports she is a long term resident at ALF and requires intermittent assistance for transfers to and from wheelchair but maneuvers wheelchair independently. Patient has a C-collar in place from previous injury and reports she does not ambulate at the ALF. Patient currently needs Min A for bed mobility and stand pivot transfers to recliner chair with cues for safety and technique. Patient does have generalized LE weakness and fatigues with activity. No pain reported during session. Heart rate monitored throughout session 79-84 bpm. Recommend PT to address functional limitations to maximize independence in preparation for discharge back to ALF. Also recommend PT at ALF.      Follow Up Recommendations Home health PT;Supervision - Intermittent (recommend PT at ALF )    Equipment Recommendations  None recommended by PT    Recommendations for Other Services       Precautions / Restrictions Precautions Precautions: Fall;Other (comment) (C-collar in place from previous injury ) Required Braces or Orthoses: Cervical Brace Restrictions Weight Bearing Restrictions: No      Mobility  Bed Mobility Overal bed mobility: Needs Assistance Bed Mobility: Supine to Sit     Supine to sit: Min assist     General bed mobility comments: Min A for intermittent trunk  support.     Transfers Overall transfer level: Needs assistance Equipment used: None Transfers: Stand Pivot Transfers   Stand pivot transfers: Min assist       General transfer comment: Min A for stand pivot transfer to left side from bed to chair. Cues for safety and technique intermittently.   Ambulation/Gait             General Gait Details: did not assess as patient reports she uses wheelchair as primary means of mobility at ALF   Stairs            Wheelchair Mobility    Modified Rankin (Stroke Patients Only)       Balance Overall balance assessment: Needs assistance Sitting-balance support: Feet supported Sitting balance-Leahy Scale: Good     Standing balance support: During functional activity Standing balance-Leahy Scale: Poor Standing balance comment: patient required Min A for standing balance for steadying                              Pertinent Vitals/Pain Pain Assessment: No/denies pain    Home Living Family/patient expects to be discharged to:: Assisted living               Home Equipment: Shower seat;Wheelchair - manual Additional Comments: LTC resident at [facility]    Prior Function Level of Independence: Needs assistance   Gait / Transfers Assistance Needed: patient reports she does not ambulate at her ALF and only uses wheelchair. patient reports intermittent assistance provided at ALF with transfers but is able to negotiate wheelchair independently to dining room   ADL's / Homemaking Assistance Needed: assistance  provided at ALF for dressing and bathing using shower seat         Hand Dominance        Extremity/Trunk Assessment   Upper Extremity Assessment Upper Extremity Assessment: Generalized weakness (did not formally MMT due to previous cervical injury )    Lower Extremity Assessment Lower Extremity Assessment: Generalized weakness       Communication   Communication: HOH  Cognition  Arousal/Alertness: Awake/alert Behavior During Therapy: WFL for tasks assessed/performed Overall Cognitive Status: Within Functional Limits for tasks assessed                                        General Comments      Exercises General Exercises - Lower Extremity Ankle Circles/Pumps: AROM;Strengthening;Both;10 reps;Seated Hip ABduction/ADduction: AAROM;Strengthening;10 reps;Both;Seated Straight Leg Raises: AAROM;Strengthening;Both;10 reps;Seated Other Exercises Other Exercises: tactile and verbal cues for technique    Assessment/Plan    PT Assessment Patient needs continued PT services  PT Problem List Decreased strength;Decreased balance;Decreased mobility;Decreased activity tolerance;Decreased safety awareness       PT Treatment Interventions DME instruction;Gait training;Stair training;Functional mobility training;Therapeutic activities;Therapeutic exercise;Balance training;Patient/family education    PT Goals (Current goals can be found in the Care Plan section)  Acute Rehab PT Goals Patient Stated Goal: to return to her facility  PT Goal Formulation: With patient Time For Goal Achievement: 03/31/20 Potential to Achieve Goals: Good    Frequency Min 2X/week   Barriers to discharge        Co-evaluation               AM-PAC PT "6 Clicks" Mobility  Outcome Measure Help needed turning from your back to your side while in a flat bed without using bedrails?: A Little Help needed moving from lying on your back to sitting on the side of a flat bed without using bedrails?: A Little Help needed moving to and from a bed to a chair (including a wheelchair)?: A Little Help needed standing up from a chair using your arms (e.g., wheelchair or bedside chair)?: A Little Help needed to walk in hospital room?: A Lot Help needed climbing 3-5 steps with a railing? : A Lot 6 Click Score: 16    End of Session Equipment Utilized During Treatment:  Oxygen Activity Tolerance: Patient tolerated treatment well (no pain reported) Patient left: in chair;with call bell/phone within reach;with bed alarm set Nurse Communication: Mobility status (white board updated with mobility status ) PT Visit Diagnosis: Muscle weakness (generalized) (M62.81);Unsteadiness on feet (R26.81);History of falling (Z91.81)    Time: 3474-2595 PT Time Calculation (min) (ACUTE ONLY): 24 min   Charges:   PT Evaluation $PT Eval Moderate Complexity: 1 Mod PT Treatments $Therapeutic Exercise: 8-22 mins        Minna Merritts, PT, MPT  Percell Locus 03/17/2020, 10:54 AM

## 2020-03-17 NOTE — Progress Notes (Addendum)
Cross Cover Paroxysmal a fib since approx 0330 per CCMD/ Highest rate 170.  Likely reason for elevated troponin on admin Started low dose beta blocker once BP improves and stabilkizes No anticoagulation secondary to frequent falls - risks outweigh benefits Daily asa Consult cardiology Echo already ordered Chest xray rule out fluid overload contributing to the PAF as has had about 3 L with minimal urine output.  Bedside patient is notably hard of hearing. Oriented to person, place, She denies chest disomfort. Cervical collar remains in place.. Also noted to have fever 102.2   Chest xray ruled out acute cardiopulmonary process .  Temp imprving.  AM labs still pending

## 2020-03-17 NOTE — Progress Notes (Signed)
OT Cancellation Note  Patient Details Name: Cindy Graham MRN: [id number] DOB: 03/20/1922   Cancelled Treatment:    Reason Eval/Treat Not Completed: Patient at procedure or test/ unavailable  OT order received and chart reviewed. Pt getting Echo currently, will f/u as able for OT evaluation. Thank you.  Gerrianne Scale, [place], OTR/L ascom [phone] 03/17/20, 9:05 AM

## 2020-03-17 NOTE — Evaluation (Signed)
Occupational Therapy Evaluation Patient Details Name: Cindy Graham MRN: [id number] DOB: 10-Oct-1921 Today's Date: 03/17/2020    History of Present Illness  Patient is a 86 y.o. female with medical history significant of hypertension, hyperlipidemia, COPD, asthma, stroke, GERD, depression, anxiety, melanoma, syncope, urinary incontinence, CKD stage III, tremor, UTI, ESBL Klebisella pneumoniae in urine, who presents with fever and generalized weakness. History of C-collar due to previous fracture of posterior arch of C1 and right C1 and C2 transverse processes and transverse foramina.    Clinical Impression   Pt seen for OT evaluation this date in setting of acute hospitalization with UTI. Pt is in good spirits with son present in room throughout. Pt reports living in ALF with assist for IADLs, some assist for bathing/dressing and using w/c primarily for fxl mobility. States she is training with rollator with PT at facility but endorses rollator "getting away" from her is likely cause of previous fall. Pt presents this date with no pain at rest, slight grimmace with attempts at transfers. Pt requires MIN/MOD A for sit to stand from recliner with RW. Pt demos P balance with posterior lean. Pt requires MOD A with LB ADLs d/t limited tolerance for reaching and MIN A with UB dressing/SETUP for self-feeding 2/2 weak grasp. OT engages pt in seated UB grooming tasks including oral care with SETUP to open containers. At end of session, pt requests to remain up in chair through lunch. RN notified. Will continue to follow. Anticipate pt will require f/u from occupational therapist at her ALF through [facility] or [place].     Follow Up Recommendations  Home health OT;Supervision/Assistance - 24 hour    Equipment Recommendations  3 in 1 bedside commode    Recommendations for Other Services       Precautions / Restrictions Precautions Precautions: Fall;Other (comment) Precaution Comments: c-collar in place from  previous fall/injury (has not yet had f/u appt to confirm when/if she can remove) Required Braces or Orthoses: Cervical Brace Cervical Brace: Hard collar Restrictions Weight Bearing Restrictions: No      Mobility Bed Mobility               General bed mobility comments: pt up to chair pre/post session. MIN A per PT note    Transfers Overall transfer level: Needs assistance Equipment used: Rolling walker (2 wheeled) Transfers: Sit to/from Stand Sit to Stand: Min assist;Mod assist         General transfer comment: slightly increased assist needed for transfer from chair d/t lower surface than bed.    Balance Overall balance assessment: Needs assistance Sitting-balance support: Feet supported Sitting balance-Leahy Scale: Good   Postural control: Posterior lean Standing balance support: Bilateral upper extremity supported Standing balance-Leahy Scale: Poor Standing balance comment: MIN A with RW, UE support to sustain static stand, noted to have weight in heels. Required cues for increased bend in knee/feet under her prior to attempt to power up to standing. Noted to be bracing on chair posteriorly.                           ADL either performed or assessed with clinical judgement   ADL Overall ADL's : Needs assistance/impaired Eating/Feeding: Set up;Sitting Eating/Feeding Details (indicate cue type and reason): needs assist to open packaging/containers d/t weak grip/grasp. Grooming: Wash/dry hands;Set up;Sitting           Upper Body Dressing : Minimal assistance;Sitting   Lower Body Dressing: Moderate assistance;Maximal assistance;Sit  to/from Archivist: Moderate assistance;BSC;RW;Stand-pivot   Toileting- Clothing Manipulation and Hygiene: Moderate assistance;Sit to/from stand               Vision Baseline Vision/History: Wears glasses Patient Visual Report: No change from baseline       Perception     Praxis       Pertinent Vitals/Pain Pain Assessment: No/denies pain     Hand Dominance Right   Extremity/Trunk Assessment Upper Extremity Assessment Upper Extremity Assessment: RUE deficits/detail;LUE deficits/detail RUE Deficits / Details: shld MMT not assessed d/t cervical injury. Elbow and wrist 4-/5, grip 3+/5 LUE Deficits / Details: shld MMT not assessed d/t cervical injury. Elbow and wrist 4-/5, grip 3+/5   Lower Extremity Assessment Lower Extremity Assessment: Defer to PT evaluation;Generalized weakness       Communication Communication Communication: HOH   Cognition Arousal/Alertness: Awake/alert Behavior During Therapy: WFL for tasks assessed/performed Overall Cognitive Status: Within Functional Limits for tasks assessed                                 General Comments: pt is alery and oriented and appropriate throughout, sometimes delayed responses 2/2 HOH, but pt is very aware and good with short term memory/carryover of new learning.   General Comments       Exercises Other Exercises Other Exercises: OT facilitates ed re: role of OT in acute setting, seated grip exercises including squeezes, ed re: important of glute squeezes to improve standing balance. Pt and son with good udnerstanding, required f/u.   Shoulder Instructions      Home Living Family/patient expects to be discharged to:: Assisted living                             Home Equipment: Shower seat;Wheelchair - [employer] - 4 wheels   Additional Comments: LTC resident at [facility]      Prior Functioning/Environment Level of Independence: Needs assistance  Gait / Transfers Assistance Needed: Reports she uses rollator with PT at her facility (has had PT since previous fall), typically uses w/c for fxl mobility throughout facility and propels with LEs. Reports intermittent assistance needed with ADL transfers, but primarily able to pivot-transfer herself. ADL's / Homemaking  Assistance Needed: Pt reports assistance provided at ALF for LB dressing and bathing using shower seat (since she fell). Meals and med mgt provided by facility.            OT Problem List: Decreased strength;Decreased activity tolerance;Impaired balance (sitting and/or standing);Decreased knowledge of use of DME or AE      OT Treatment/Interventions: Self-care/ADL training;DME and/or AE instruction;Therapeutic activities;Balance training;Energy conservation;Patient/family education;Therapeutic exercise    OT Goals(Current goals can be found in the care plan section) Acute Rehab OT Goals Patient Stated Goal: to return to her facility  OT Goal Formulation: With patient/family Time For Goal Achievement: 03/31/20 Potential to Achieve Goals: Good ADL Goals Pt Will Perform Grooming: standing;with min guard assist (to complete 1 g/h tasks to improve fxl static standing tolerance) Pt Will Perform Upper Body Dressing: with modified independence;sitting Pt Will Transfer to Toilet: with supervision;stand pivot transfer Pt/caregiver will Perform Home Exercise Program: Increased strength;Both right and left upper extremity;With Supervision (strengthening lower UEs-mindful of precautions r/t cervical collar) Additional ADL Goal #1: Pt will demo understanding of safe use of RW for ADL transfer with 0 verbal cues.  OT  Frequency: Min 1X/week   Barriers to D/C:            Co-evaluation              AM-PAC OT "6 Clicks" Daily Activity     Outcome Measure Help from another person eating meals?: A Little Help from another person taking care of personal grooming?: A Little Help from another person toileting, which includes using toliet, bedpan, or urinal?: A Lot Help from another person bathing (including washing, rinsing, drying)?: A Lot Help from another person to put on and taking off regular upper body clothing?: A Little Help from another person to put on and taking off regular lower body  clothing?: A Lot 6 Click Score: 15   End of Session Equipment Utilized During Treatment: Gait belt;Rolling walker Nurse Communication: Mobility status;Other (comment) (notified her nightgown needed removed from IV line and that pt wanting to get back to bed after eating lunch.)  Activity Tolerance: Patient tolerated treatment well Patient left: in chair;with call bell/phone within reach;with chair alarm set  OT Visit Diagnosis: Unsteadiness on feet (R26.81);Muscle weakness (generalized) (M62.81)                Time: 1226-1310 OT Time Calculation (min): 44 min Charges:  OT General Charges $OT Visit: 1 Visit OT Evaluation $OT Eval Moderate Complexity: 1 Mod OT Treatments $Self Care/Home Management : 8-22 mins $Therapeutic Activity: 8-22 mins  Gerrianne Scale, MS, OTR/L ascom [phone] 03/17/20, 3:08 PM

## 2020-03-17 NOTE — Plan of Care (Signed)

## 2020-03-17 NOTE — Progress Notes (Signed)
Received report from Chris, RN. Assuming care of patient at this time.  

## 2020-03-17 NOTE — Progress Notes (Signed)
[place] [facility] [place] NAME: Cindy Graham    MR#:  [id number]  DATE OF BIRTH:  July 29, 1921  SUBJECTIVE:   Patient came in with fever and urinary symptoms. Admitted with severe sepsis. Continues to have fever of 102 this morning. Patient is out in the chair. Son in the room. Denies any nausea vomiting. Ate little breakfast. No respiratory distress. No cough HOH REVIEW OF SYSTEMS:   Review of Systems  Constitutional: Negative for chills, fever and weight loss.  HENT: Negative for ear discharge, ear pain and nosebleeds.   Eyes: Negative for blurred vision, pain and discharge.  Respiratory: Negative for sputum production, shortness of breath, wheezing and stridor.   Cardiovascular: Negative for chest pain, palpitations, orthopnea and PND.  Gastrointestinal: Negative for abdominal pain, diarrhea, nausea and vomiting.  Genitourinary: Negative for frequency and urgency.  Musculoskeletal: Negative for back pain and joint pain.  Neurological: Negative for sensory change, speech change, focal weakness and weakness.  Psychiatric/Behavioral: Negative for depression and hallucinations. The patient is not nervous/anxious.    Tolerating Diet:yes Tolerating PT: HHPT at ALF  DRUG ALLERGIES:   Allergies  Allergen Reactions  . Statins Other (See Comments)    Very bad muscle aches  . Tramadol Itching  . Levaquin [Levofloxacin In D5w] Diarrhea and Other (See Comments)    Confusion, hallucinations  . Neomycin-Polymyxin-Gramicidin Other (See Comments)  . Penicillins     Did it involve swelling of the face/tongue/throat, SOB, or low BP? Unknown Did it involve sudden or severe rash/hives, skin peeling, or any reaction on the inside of your mouth or nose? Unknown Did you need to seek medical attention at a hospital or doctor's office? Unknown When did it last happen?Unknown If all above answers are "NO", may proceed with cephalosporin use.  . Sulfa  Antibiotics   . Tetracyclines & Related     VITALS:  Blood pressure (!) 153/56, pulse 79, temperature (!) 101.4 F (38.6 C), temperature source Oral, resp. rate 17, height 5\' 3"  (1.6 m), weight 67.1 kg, SpO2 95 %.  PHYSICAL EXAMINATION:   Physical Exam  GENERAL:  86 y.o.-year-old patient lying in the bed with no acute distress.  HEENT: Head atraumatic, normocephalic. Oropharynx and nasopharynx clear.  NECK:  Supple, no jugular venous distention.Neck collar+ LUNGS: Normal breath sounds bilaterally, no wheezing, rales, rhonchi. No use of accessory muscles of respiration.  CARDIOVASCULAR: S1, S2 normal. No murmurs, rubs, or gallops.  ABDOMEN: Soft, nontender, nondistended. Bowel sounds present. No organomegaly or mass.  EXTREMITIES: No cyanosis, clubbing or edema b/l.    NEUROLOGIC:  No focal Motor or sensory deficits b/l.  No focal deficit PSYCHIATRIC:  patient is alert and oriented x 3.  SKIN: No obvious rash, lesion, or ulcer.   LABORATORY PANEL:  CBC Recent Labs  Lab 03/17/20 0704  WBC 10.1  HGB 10.9*  HCT 33.5*  PLT 138*    Chemistries  Recent Labs  Lab 03/16/20 1429 03/16/20 1429 03/17/20 0704  NA 138   < > 138  K 4.0   < > 3.8  CL 102   < > 106  CO2 27   < > 23  GLUCOSE 108*   < > 100*  BUN 38*   < > 35*  CREATININE 1.26*   < > 1.07*  CALCIUM 8.9   < > 8.1*  AST 30  --   --   ALT 19  --   --   ALKPHOS 57  --   --  BILITOT 1.2  --   --    < > = values in this interval not displayed.   Cardiac Enzymes No results for input(s): TROPONINI in the last 168 hours. RADIOLOGY:  DG Chest Port 1 View  Result Date: 03/17/2020 CLINICAL DATA:  Tachycardia. EXAM: PORTABLE CHEST 1 VIEW COMPARISON:  One-view chest x-ray 03/16/2020 FINDINGS: Heart size is normal. Lung volumes are low. Right hemidiaphragm is somewhat elevated. Mild dependent atelectasis present. No other significant airspace disease is present. No edema or effusion is noted. Axial skeleton is  unremarkable. IMPRESSION: 1. Low lung volumes and mild bibasilar atelectasis. 2. No acute cardiopulmonary disease. Electronically Signed   By: San Morelle M.D.   On: 03/17/2020 05:51   DG Chest Port 1 View  Result Date: 03/16/2020 CLINICAL DATA:  Sepsis, UTI EXAM: PORTABLE CHEST 1 VIEW COMPARISON:  06/21/2019 flu FINDINGS: The heart size and mediastinal contours are stable. Atherosclerotic calcification of the aortic knob. Mild bibasilar atelectasis. Lungs appear otherwise clear. No pleural effusion or pneumothorax. Patient is wearing a cervical collar. There are degenerative changes of the spine and bilateral shoulders. IMPRESSION: Mild bibasilar atelectasis. Otherwise no acute cardiopulmonary process. Electronically Signed   By: Davina Poke D.O.   On: 03/16/2020 14:42   ECHOCARDIOGRAM COMPLETE  Result Date: 03/17/2020    ECHOCARDIOGRAM REPORT   Patient Name:   Cindy Graham Date of Exam: 03/17/2020 Medical Rec #:  [id number]      Height:       63.0 in Accession #:    [id number]     Weight:       148.0 lb Date of Birth:  [birth year]      BSA:          1.701 m Patient Age:    86 years       BP:           153/56 mmHg Patient Gender: F              HR:           79 bpm. Exam Location:  [facility] Procedure: 2D Echo, Cardiac Doppler and Color Doppler Indications:     Elevated troponin  History:         Patient has prior history of Echocardiogram examinations, most                  recent 02/24/2019. COPD, Signs/Symptoms:Murmur; Risk                  Factors:Hypertension and Dyslipidemia.  Sonographer:     Sherrie Sport RDCS (AE) Referring Phys:  [id number] Soledad Gerlach NIU Diagnosing Phys: Neoma Laming MD IMPRESSIONS  1. Left ventricular ejection fraction, by estimation, is 60 to 65%. The left ventricle has normal function. The left ventricle has no regional wall motion abnormalities. There is moderate concentric left ventricular hypertrophy. Left ventricular diastolic parameters are consistent with Grade II diastolic  dysfunction (pseudonormalization).  2. Right ventricular systolic function is normal. The right ventricular size is normal.  3. Left atrial size was moderately dilated.  4. Right atrial size was mildly dilated.  5. The mitral valve is normal in structure. Mild mitral valve regurgitation. No evidence of mitral stenosis.  6. The aortic valve is normal in structure. Aortic valve regurgitation is trivial. No aortic stenosis is present.  7. The inferior vena cava is normal in size with greater than 50% respiratory variability, suggesting right atrial pressure of 3 mmHg. FINDINGS  Left Ventricle: Left ventricular ejection  fraction, by estimation, is 60 to 65%. The left ventricle has normal function. The left ventricle has no regional wall motion abnormalities. The left ventricular internal cavity size was normal in size. There is  moderate concentric left ventricular hypertrophy. Left ventricular diastolic parameters are consistent with Grade II diastolic dysfunction (pseudonormalization). Right Ventricle: The right ventricular size is normal. No increase in right ventricular wall thickness. Right ventricular systolic function is normal. Left Atrium: Left atrial size was moderately dilated. Right Atrium: Right atrial size was mildly dilated. Pericardium: There is no evidence of pericardial effusion. Mitral Valve: The mitral valve is normal in structure. Mild to moderate mitral annular calcification. Mild mitral valve regurgitation. No evidence of mitral valve stenosis. Tricuspid Valve: The tricuspid valve is normal in structure. Tricuspid valve regurgitation is mild . No evidence of tricuspid stenosis. Aortic Valve: The aortic valve is normal in structure. Aortic valve regurgitation is trivial. No aortic stenosis is present. Aortic valve mean gradient measures 3.5 mmHg. Aortic valve peak gradient measures 6.0 mmHg. Aortic valve area, by VTI measures 3.70 cm. Pulmonic Valve: The pulmonic valve was normal in structure.  Pulmonic valve regurgitation is trivial. No evidence of pulmonic stenosis. Aorta: The aortic root is normal in size and structure. Venous: The inferior vena cava is normal in size with greater than 50% respiratory variability, suggesting right atrial pressure of 3 mmHg. IAS/Shunts: No atrial level shunt detected by color flow Doppler.  LEFT VENTRICLE PLAX 2D LVIDd:         4.11 cm  Diastology LVIDs:         2.54 cm  LV e' medial:    4.79 cm/s LV PW:         1.52 cm  LV E/e' medial:  23.2 LV IVS:        1.31 cm  LV e' lateral:   6.20 cm/s LVOT diam:     2.00 cm  LV E/e' lateral: 17.9 LV SV:         96 LV SV Index:   57 LVOT Area:     3.14 cm  RIGHT VENTRICLE RV Basal diam:  2.63 cm RV S prime:     11.70 cm/s TAPSE (M-mode): 3.2 cm LEFT ATRIUM             Index       RIGHT ATRIUM           Index LA diam:        4.10 cm 2.41 cm/m  RA Area:     13.80 cm LA Vol (A2C):   48.3 ml 28.39 ml/m RA Volume:   27.00 ml  15.87 ml/m LA Vol (A4C):   49.1 ml 28.86 ml/m LA Biplane Vol: 49.2 ml 28.92 ml/m  AORTIC VALVE                   PULMONIC VALVE AV Area (Vmax):    2.59 cm    PV Vmax:        0.86 m/s AV Area (Vmean):   2.76 cm    PV Peak grad:   3.0 mmHg AV Area (VTI):     3.70 cm    RVOT Peak grad: 3 mmHg AV Vmax:           122.50 cm/s AV Vmean:          84.100 cm/s AV VTI:            0.260 m AV Peak Grad:      6.0  mmHg AV Mean Grad:      3.5 mmHg LVOT Vmax:         101.00 cm/s LVOT Vmean:        74.000 cm/s LVOT VTI:          0.306 m LVOT/AV VTI ratio: 1.18  AORTA Ao Root diam: 3.10 cm MITRAL VALVE                TRICUSPID VALVE MV Area (PHT): 3.12 cm     TR Peak grad:   32.5 mmHg MV Decel Time: 243 msec     TR Vmax:        285.00 cm/s MV E velocity: 111.00 cm/s MV A velocity: 108.00 cm/s  SHUNTS MV E/A ratio:  1.03         Systemic VTI:  0.31 m                             Systemic Diam: 2.00 cm Neoma Laming MD Electronically signed by Neoma Laming MD Signature Date/Time: 03/17/2020/10:11:52 AM    Final     ASSESSMENT AND PLAN:   Cindy Graham is a 86 y.o. female with medical history significant of hypertension, hyperlipidemia, COPD, asthma, stroke, GERD, depression, anxiety, melanoma, syncope, urinary incontinence, CKD stage III, tremor, UTI, ESBL Klebisella pneumoniae in urine, who presents with fever and generalized weakness.  Severe sepsis due to UTI: Patient has possible UTI.   -sepsis resolved -Her urinalysis showed positive nitrite and cloudy appearance, patient has typical symptoms of UTI.   --Patient meets criteria for severe sepsis with leukocytosis and tachypnea, hypotension. -Lactic acid is normal.  - Currently hemodynamically stable.  Blood pressure responded to IV fluid. -IV Meropenum (patient received 1 dose of vancomycin and cefepime by ED)--did not have any allergic reaction with cefepime.  -Discussed with son in the room who is not aware of any specific reason for penicillin allergy. Will discussed with pharmacy to see if cephalosporins can be given. -Follow-up blood culture negative  - urine culture pending -previous urine culture grew Klebsiella ESBL - Procalcitonin 0.85 -received IV fluids -leukocytosis resolved. -Chest x-ray negative for pneumonia -10/26 --patient continues to spike fever 102.1 although overall she feels better. Will continue to monitor fever curve for 24 hrs  Hypertension -Hold blood pressure medications (enalapril, propranolol, verapamil) due to hypotension -10/26-- blood pressure trending up. Will resume home meds. Patient hemodynamically stable at present.  Acute renal failure superimposed on stage 3a chronic kidney disease ([place]): Baseline creatinine 1.0.  Her creatinine is 1.26, BUN 38, likely due to UTI.  ATN is also possible due to hypotension. -Avoid using renal toxic medications. -Enalapril is on hold -received IV fluid as above -creatinine down to 1.07 (1.26)  Depression/Anxiety -Continue home medications  Stroke (cerebrum)  (HCC) -Continue aspirin  COPD (chronic obstructive pulmonary disease) (HCC) --As needed bronchodilators, albuterol  Elevated troponin: no CP. Troponin 469 --> 412, trending down to 227 -patient chest pain-free. -10/25-- Consulted Dr. Fletcher Anon of cardiology.  Per Dr. Fletcher Anon, patient most likely has demand ischemia, he recommended to trend troponin and obtain 2D echo.  --prn NTG -Aspirin -Follow-up 2D echo  H/o C1- C2 transverse process fracture Cont neck collar  DVT ppx: SQ Lovenox Code Status: DNR per pt which is confirmed by her daughter-in-law at admission Family Communication: Yes, patient's son in the room Disposition Plan:  Anticipate discharge back to previous environment 0cotber 27th Consults called:  Dr. Jacklynn Ganong Admission  status: Med-surg bed  Dispo: The patient is from: ALF  Anticipated d/c is to: ALF  Anticipated d/c date is: 1 day  Patient currently is not medically stable to d/c.  Status is: Inpatient  Remains inpatient appropriate because:Inpatient level of care appropriate due to severity of illness patient admitted with severe sepsis. Improving. Had fever of 102.1 this morning. Overall stable will continue monitor fever curve and follow-up urine culture. If remains stable patient can be discharged back to her assisted living facility with PT October 27.  Patient and son agrees with plan      TOTAL TIME TAKING CARE OF THIS PATIENT: 25 minutes.  >50% time spent on counselling and coordination of care  Note: This dictation was prepared with Dragon dictation along with smaller phrase technology. Any transcriptional errors that result from this process are unintentional.  Fritzi Mandes M.D    [facility]   CC: Primary care physician; [facility], Doctors MakingPatient ID: Cindy Graham, female   DOB: [birth year], 86 y.o.   MRN: [id number]

## 2020-03-17 NOTE — Progress Notes (Signed)
*  PRELIMINARY RESULTS* Echocardiogram 2D Echocardiogram has been performed.  Cindy Graham 03/17/2020, 9:09 AM

## 2020-03-18 DIAGNOSIS — N179 Acute kidney failure, unspecified: Secondary | ICD-10-CM | POA: Diagnosis not present

## 2020-03-18 DIAGNOSIS — A419 Sepsis, unspecified organism: Secondary | ICD-10-CM | POA: Diagnosis not present

## 2020-03-18 DIAGNOSIS — N3000 Acute cystitis without hematuria: Secondary | ICD-10-CM | POA: Diagnosis not present

## 2020-03-18 DIAGNOSIS — R000 Tachycardia, unspecified: Secondary | ICD-10-CM

## 2020-03-18 LAB — BLOOD CULTURE ID PANEL (REFLEXED) - BCID2

## 2020-03-18 LAB — URINE CULTURE

## 2020-03-18 MED ORDER — CEPHALEXIN 250 MG PO CAPS
250.0000 mg | ORAL_CAPSULE | Freq: Two times a day (BID) | ORAL | 0 refills | Status: AC
Start: 2020-03-18 — End: 2020-03-26

## 2020-03-18 MED ORDER — SODIUM CHLORIDE 0.9 % IV SOLN
2.0000 g | INTRAVENOUS | Status: DC
  Administered 2020-03-18: 2 g via INTRAVENOUS
  Filled 2020-03-18 (×2): qty 20

## 2020-03-18 NOTE — Discharge Instructions (Signed)
Urinary Tract Infection, Adult A urinary tract infection (UTI) is an infection of any part of the urinary tract. The urinary tract includes:  The kidneys.  The ureters.  The bladder.  The urethra. These organs make, store, and get rid of pee (urine) in the body. What are the causes? This is caused by germs (bacteria) in your genital area. These germs grow and cause swelling (inflammation) of your urinary tract. What increases the risk? You are more likely to develop this condition if:  You have a small, thin tube (catheter) to drain pee.  You cannot control when you pee or poop (incontinence).  You are female, and: ? You use these methods to prevent pregnancy:  A medicine that kills sperm (spermicide).  A device that blocks sperm (diaphragm). ? You have low levels of a female hormone (estrogen). ? You are pregnant.  You have genes that add to your risk.  You are sexually active.  You take antibiotic medicines.  You have trouble peeing because of: ? A prostate that is bigger than normal, if you are female. ? A blockage in the part of your body that drains pee from the bladder (urethra). ? A kidney stone. ? A nerve condition that affects your bladder (neurogenic bladder). ? Not getting enough to drink. ? Not peeing often enough.  You have other conditions, such as: ? Diabetes. ? A weak disease-fighting system (immune system). ? Sickle cell disease. ? Gout. ? Injury of the spine. What are the signs or symptoms? Symptoms of this condition include:  Needing to pee right away (urgently).  Peeing often.  Peeing small amounts often.  Pain or burning when peeing.  Blood in the pee.  Pee that smells bad or not like normal.  Trouble peeing.  Pee that is cloudy.  Fluid coming from the vagina, if you are female.  Pain in the belly or lower back. Other symptoms include:  Throwing up (vomiting).  No urge to eat.  Feeling mixed up (confused).  Being tired  and grouchy (irritable).  A fever.  Watery poop (diarrhea). How is this treated? This condition may be treated with:  Antibiotic medicine.  Other medicines.  Drinking enough water. Follow these instructions at home:  Medicines  Take over-the-counter and prescription medicines only as told by your doctor.  If you were prescribed an antibiotic medicine, take it as told by your doctor. Do not stop taking it even if you start to feel better. General instructions  Make sure you: ? Pee until your bladder is empty. ? Do not hold pee for a long time. ? Empty your bladder after sex. ? Wipe from front to back after pooping if you are a female. Use each tissue one time when you wipe.  Drink enough fluid to keep your pee pale yellow.  Keep all follow-up visits as told by your doctor. This is important. Contact a doctor if:  You do not get better after 1-2 days.  Your symptoms go away and then come back. Get help right away if:  You have very bad back pain.  You have very bad pain in your lower belly.  You have a fever.  You are sick to your stomach (nauseous).  You are throwing up. Summary  A urinary tract infection (UTI) is an infection of any part of the urinary tract.  This condition is caused by germs in your genital area.  There are many risk factors for a UTI. These include having a small, thin   tube to drain pee and not being able to control when you pee or poop.  Treatment includes antibiotic medicines for germs.  Drink enough fluid to keep your pee pale yellow. This information is not intended to replace advice given to you by your health care provider. Make sure you discuss any questions you have with your health care provider. Document Revised: 04/26/2018 Document Reviewed: 11/16/2017 Elsevier Patient Education  2020 Elsevier Inc.  

## 2020-03-18 NOTE — TOC Initial Note (Signed)
Transition of Care [facility]) - Initial/Assessment Note    Patient Details  Name: Cindy Graham MRN: [id number] Date of Birth: 20-May-1922  [facility]) CM/SW Contact:    Shelbie Ammons, RN Phone Number: 03/18/2020, 1:24 PM  Clinical Narrative:    RNCM met with patient and son at bedside. Patient reports to feeling well overall and feels like she is ready to go.  Dr. Manuella Ghazi to bedside for patient assessment. Patient is a resident of [facility] and plan is for her to return there. Either the facility or her son provides transportation and the facility doctor sees her as well as they provide her medications.  RNCM reached out to facility to let them know she would be returning with orders for home health. RNCM completed FL-2 and sent that along with d/c summary in packet to facility.  Patient's son will transport back to facility.        Expected Discharge Plan: Assisted Living Barriers to Discharge: No Barriers Identified   Patient Goals and CMS Choice        Expected Discharge Plan and Services Expected Discharge Plan: Assisted Living     Post Acute Care Choice: [place] arrangements for the past 2 months: Single Family Home Expected Discharge Date: 03/18/20                                    Prior Living Arrangements/Services Living arrangements for the past 2 months: Single Family Home Lives with:: Facility Resident Patient language and need for interpreter reviewed:: Yes Do you feel safe going back to the place where you live?: Yes      Need for Family Participation in Patient Care: Yes (Comment) Care giver support system in place?: Yes (comment)   Criminal Activity/Legal Involvement Pertinent to Current Situation/Hospitalization: No - Comment as needed  Activities of Daily Living   ADL Screening (condition at time of admission) Patient's cognitive ability adequate to safely complete daily activities?: No Is the patient deaf or have  difficulty hearing?: Yes Does the patient have difficulty seeing, even when wearing glasses/contacts?: No Does the patient have difficulty concentrating, remembering, or making decisions?: No Patient able to express need for assistance with ADLs?: Yes Does the patient have difficulty dressing or bathing?: Yes Independently performs ADLs?: No Communication: Needs assistance Is this a change from baseline?: Pre-admission baseline Dressing (OT): Needs assistance Is this a change from baseline?: Pre-admission baseline Grooming: Independent Feeding: Independent, Needs assistance Is this a change from baseline?: Pre-admission baseline Bathing: Needs assistance Is this a change from baseline?: Pre-admission baseline Toileting: Needs assistance Is this a change from baseline?: Pre-admission baseline In/Out Bed: Needs assistance Is this a change from baseline?: Pre-admission baseline Walks in Home: Independent Does the patient have difficulty walking or climbing stairs?: Yes Weakness of Legs: Both Weakness of Arms/Hands: None  Permission Sought/Granted                  Emotional Assessment Appearance:: Appears stated age Attitude/Demeanor/Rapport: Engaged Affect (typically observed): Calm, Appropriate Orientation: : Oriented to Self, Oriented to Place, Oriented to  Time, Oriented to Situation Alcohol / Substance Use: Not Applicable Psych Involvement: No (comment)  Admission diagnosis:  Sepsis ([place]) [A41.9] Sepsis, due to unspecified organism, unspecified whether acute organ dysfunction present [facility]) [A41.9] Patient Active Problem List   Diagnosis Date Noted  . Tachycardia   . Sepsis ([place]) 03/17/2020  .  Severe sepsis ([place]) 03/16/2020  . COPD (chronic obstructive pulmonary disease) ([place])   . Elevated troponin   . Displaced intertrochanteric fracture of right femur, initial encounter for closed fracture ([place])   . Acute kidney injury (AKI) with acute tubular necrosis (ATN) ([place])  02/27/2019  . Stroke (cerebrum) ([place]) 02/22/2019  . Cellulitis 06/12/2018  . Hyponatremia 11/24/2017  . Headache 11/24/2017  . Wound of left leg 11/01/2017  . Constipation 09/05/2017  . Other fatigue 12/22/2016  . Tremor 03/29/2016  . Amaurosis fugax 12/25/2015  . Asthma 09/17/2015  . PVD (peripheral vascular disease) ([place]) 05/28/2015  . B12 deficiency 08/29/2014  . Chronic back pain greater than 3 months duration 04/23/2013  . Acute renal failure superimposed on stage 3a chronic kidney disease ([place]) 04/12/2012  . Hyperlipidemia LDL goal < 100 03/05/2012  . UTI (urinary tract infection) 07/26/2011  . Osteoarthritis 07/06/2011  . Hypertension 02/21/2011  . Depression/Anxiety 02/21/2011   PCP:  Orvis Brill, [facility] Pharmacy:   [facility] [place], [place] [address] Phone: [phone] Fax: [facility] [place], [place] [address] [place] [address] Phone: [phone] Fax: [place], [place] [address] [place]. [place] [address] Phone: [phone] Fax: [phone]     Social Determinants of Health (SDOH) Interventions    Readmission Risk Interventions No flowsheet data found.

## 2020-03-18 NOTE — NC FL2 (Signed)
[place] LEVEL OF CARE SCREENING TOOL     IDENTIFICATION  Patient Name: Cindy Graham Birthdate: 07-03-1921 Sex: female Admission Date (Current Location): 03/16/2020  [place] and [place] Number:  Engineering geologist and Address:  [facility], [address], [place], [place] [address]      Provider Number: [id number]  Attending Physician Name and Address:  Max Sane, MD  Relative Name and Phone Number:  Kelechi Astarita [phone]    Current Level of Care: Hospital Recommended Level of Care: [place] Prior Approval Number:    Date Approved/Denied:   PASRR Number:    Discharge Plan: Domiciliary (Rest home)    Current Diagnoses: Patient Active Problem List   Diagnosis Date Noted  . Tachycardia   . Sepsis ([place]) 03/17/2020  . Severe sepsis ([place]) 03/16/2020  . COPD (chronic obstructive pulmonary disease) ([place])   . Elevated troponin   . Displaced intertrochanteric fracture of right femur, initial encounter for closed fracture ([place])   . Acute kidney injury (AKI) with acute tubular necrosis (ATN) ([place]) 02/27/2019  . Stroke (cerebrum) ([place]) 02/22/2019  . Cellulitis 06/12/2018  . Hyponatremia 11/24/2017  . Headache 11/24/2017  . Wound of left leg 11/01/2017  . Constipation 09/05/2017  . Other fatigue 12/22/2016  . Tremor 03/29/2016  . Amaurosis fugax 12/25/2015  . Asthma 09/17/2015  . PVD (peripheral vascular disease) ([place]) 05/28/2015  . B12 deficiency 08/29/2014  . Chronic back pain greater than 3 months duration 04/23/2013  . Acute renal failure superimposed on stage 3a chronic kidney disease ([place]) 04/12/2012  . Hyperlipidemia LDL goal < 100 03/05/2012  . UTI (urinary tract infection) 07/26/2011  . Osteoarthritis 07/06/2011  . Hypertension 02/21/2011  . Depression/Anxiety 02/21/2011    Orientation RESPIRATION BLADDER Height & Weight     Self, Time, Situation, Place  Normal External catheter Weight: 67.1  kg Height:  5\' 3"  (160 cm)  BEHAVIORAL SYMPTOMS/MOOD NEUROLOGICAL BOWEL NUTRITION STATUS      Continent Diet (Heart Healthy)  AMBULATORY STATUS COMMUNICATION OF NEEDS Skin   Limited Assist Verbally Normal                       Personal Care Assistance Level of Assistance  Bathing, Feeding, Dressing Bathing Assistance: Limited assistance Feeding assistance: Independent Dressing Assistance: Limited assistance     Functional Limitations Info             SPECIAL CARE FACTORS FREQUENCY                       Contractures      Additional Factors Info  Code Status, Allergies Code Status Info: DNR Allergies Info: Statins, Tramadol, Levaquin, Neomycin, PCN, Sulfa, Tetracyclines           Current Medications (03/18/2020):   Discharge Medications: TAKE these medications   acetaminophen 500 MG tablet Commonly known as: TYLENOL Take 500 mg by mouth every 6 (six) hours as needed for mild pain or moderate pain.   acetaminophen 325 MG tablet Commonly known as: TYLENOL Take 650 mg by mouth 2 (two) times daily.   alendronate 70 MG tablet Commonly known as: FOSAMAX Take 70 mg by mouth every Thursday.   aspirin 325 MG tablet Take 1 tablet (325 mg total) by mouth daily.   cephALEXin 250 MG capsule Commonly known as: KEFLEX Take 1 capsule (250 mg total) by mouth 2 (two) times daily for 8 days.   Daily Vites  tablet Take 1 tablet by mouth daily.   enalapril 20 MG tablet Commonly known as: VASOTEC Take 20 mg by mouth daily.   feeding supplement Liqd Take 237 mLs by mouth 2 (two) times daily between meals.   gabapentin 300 MG capsule Commonly known as: NEURONTIN Take 1 capsule (300 mg total) by mouth at bedtime. What changed: when to take this   guaifenesin 100 MG/5ML syrup Commonly known as: ROBITUSSIN Take 200 mg by mouth every 6 (six) hours as needed for cough.   loperamide 2 MG capsule Commonly known as: IMODIUM Take 2 mg by mouth 4  (four) times daily as needed for diarrhea or loose stools.   magnesium hydroxide 400 MG/5ML suspension Commonly known as: MILK OF MAGNESIA Take 30 mLs by mouth daily as needed for mild constipation.   Mintox 859-292-44 MG/5ML suspension Generic drug: alum & mag hydroxide-simeth Take 30 mLs by mouth 3 (three) times daily as needed for indigestion or heartburn.   nitroGLYCERIN 0.4 MG SL tablet Commonly known as: NITROSTAT Place 0.4 mg under the tongue every 5 (five) minutes as needed for chest pain.   Nucynta 50 MG tablet Generic drug: tapentadol Take 50 mg by mouth 3 (three) times daily.   nystatin cream Commonly known as: MYCOSTATIN Apply 1 application topically as needed (panniculitis). Apply to pannus   omeprazole 20 MG capsule Commonly known as: PRILOSEC TAKE 1 CAPSULE BY MOUTH EVERY DAY. What changed:   how much to take  how to take this  when to take this  additional instructions   oxybutynin 5 MG tablet Commonly known as: DITROPAN TAKE ONE TABLET BY MOUTH EVERY DAY   polyethylene glycol 17 g packet Commonly known as: MIRALAX / GLYCOLAX Take 17 g by mouth daily.   propranolol 40 MG tablet Commonly known as: INDERAL Take 1 tablet (40 mg total) by mouth daily.   sertraline 100 MG tablet Commonly known as: ZOLOFT TAKE ONE TABLET BY MOUTH EVERY DAY What changed:   how much to take  how to take this  when to take this  additional instructions   verapamil 240 MG 24 hr capsule Commonly known as: VERELAN PM Take 240 mg by mouth at bedtime.   Vitamin D3 50 MCG (2000 UT) capsule Take 2,000 Units by mouth daily.      Relevant Imaging Results:  Relevant Lab Results:   Additional Information SSN: [id number]  Shelbie Ammons, RN

## 2020-03-18 NOTE — Discharge Summary (Addendum)
[place] [facility] [place] NAME: Cindy Graham    MR#:  [id number]  DATE OF BIRTH:  [birth year]  DATE OF ADMISSION:  03/16/2020   ADMITTING PHYSICIAN: Fritzi Mandes, MD  DATE OF DISCHARGE: 03/18/2020  PRIMARY CARE PHYSICIAN: [facility]   ADMISSION DIAGNOSIS:  Sepsis ([place]) [A41.9] Sepsis, due to unspecified organism, unspecified whether acute organ dysfunction present ([place]) [A41.9] DISCHARGE DIAGNOSIS:  Principal Problem:   Severe sepsis ([place]) Active Problems:   Hypertension   Depression/Anxiety   UTI (urinary tract infection)   Acute renal failure superimposed on stage 3a chronic kidney disease (HCC)   Stroke (cerebrum) (HCC)   COPD (chronic obstructive pulmonary disease) (HCC)   Elevated troponin   Sepsis ([place])   Tachycardia  SECONDARY DIAGNOSIS:   Past Medical History:  Diagnosis Date  . Anxiety   . Arthritis    osteo  . Asthma   . COPD (chronic obstructive pulmonary disease) ([place])   . GERD (gastroesophageal reflux disease)   . Heart murmur   . Hernia   . Hyperlipidemia   . Hypertension   . Melanoma ([place])    face  . Syncope and collapse   . Urine incontinence    HOSPITAL COURSE:  Cindy Graham a 86 y.o.femalewith medical history significant ofhypertension, hyperlipidemia, COPD, asthma, stroke, GERD, depression, anxiety, melanoma, syncope, urinary incontinence, CKD stage III a, tremor, UTI,ESBL Klebisella pneumoniae in urine,admitted for fever and generalized weakness.  Severe sepsisdue to e.coli [id number] on admission -sepsis resolved - Urine c/s growing multiple organisms. Blood c/s with GNR in 1/2 sets. E.coli detected on BCID (no resistant genes). - finish total 10 days course of PO Keflex   Hypertension -resume home BP meds at D/C  stage 3a chronic kidney disease ([place]):  Depression/Anxiety -Continue home medications  Stroke (cerebrum) (HCC) -Continue aspirin  COPD (chronic obstructive  pulmonary disease) (HCC) --stable  Elevated troponin: no CP.Troponin469-->412, trending down to 227 -patient chest pain-free. -10/25--Consulted Dr. Fletcher Anon of cardiology. Per Dr. Fletcher Anon, patient most likely has demand ischemia  H/o C1- C2 transverse process fracture Cont neck collar  DISCHARGE CONDITIONS:  stable CONSULTS OBTAINED:   DRUG ALLERGIES:   Allergies  Allergen Reactions  . Statins Other (See Comments)    Very bad muscle aches  . Tramadol Itching  . Levaquin [Levofloxacin In D5w] Diarrhea and Other (See Comments)    Confusion, hallucinations  . Neomycin-Polymyxin-Gramicidin Other (See Comments)  . Penicillins     Tolerates cephalosporins Did it involve swelling of the face/tongue/throat, SOB, or low BP? Unknown Did it involve sudden or severe rash/hives, skin peeling, or any reaction on the inside of your mouth or nose? Unknown Did you need to seek medical attention at a hospital or doctor's office? Unknown When did it last happen?Unknown If all above answers are "NO", may proceed with cephalosporin use.  . Sulfa Antibiotics   . Tetracyclines & Related    DISCHARGE MEDICATIONS:   Allergies as of 03/18/2020      Reactions   Statins Other (See Comments)   Very bad muscle aches   Tramadol Itching   Levaquin [levofloxacin In D5w] Diarrhea, Other (See Comments)   Confusion, hallucinations   Neomycin-polymyxin-gramicidin Other (See Comments)   Penicillins    Tolerates cephalosporins Did it involve swelling of the face/tongue/throat, SOB, or low BP? Unknown Did it involve sudden or severe rash/hives, skin peeling, or any reaction on the inside of your mouth or nose? Unknown Did you need to seek medical attention  at a hospital or doctor's office? Unknown When did it last happen?Unknown If all above answers are "NO", may proceed with cephalosporin use.   Sulfa Antibiotics    Tetracyclines & Related       Medication List    TAKE these medications     acetaminophen 500 MG tablet Commonly known as: TYLENOL Take 500 mg by mouth every 6 (six) hours as needed for mild pain or moderate pain.   acetaminophen 325 MG tablet Commonly known as: TYLENOL Take 650 mg by mouth 2 (two) times daily.   alendronate 70 MG tablet Commonly known as: FOSAMAX Take 70 mg by mouth every Thursday.   aspirin 325 MG tablet Take 1 tablet (325 mg total) by mouth daily.   cephALEXin 250 MG capsule Commonly known as: KEFLEX Take 1 capsule (250 mg total) by mouth 2 (two) times daily for 8 days.   Daily Vites tablet Take 1 tablet by mouth daily.   enalapril 20 MG tablet Commonly known as: VASOTEC Take 20 mg by mouth daily.   feeding supplement Liqd Take 237 mLs by mouth 2 (two) times daily between meals.   gabapentin 300 MG capsule Commonly known as: NEURONTIN Take 1 capsule (300 mg total) by mouth at bedtime. What changed: when to take this   guaifenesin 100 MG/5ML syrup Commonly known as: ROBITUSSIN Take 200 mg by mouth every 6 (six) hours as needed for cough.   loperamide 2 MG capsule Commonly known as: IMODIUM Take 2 mg by mouth 4 (four) times daily as needed for diarrhea or loose stools.   magnesium hydroxide 400 MG/5ML suspension Commonly known as: MILK OF MAGNESIA Take 30 mLs by mouth daily as needed for mild constipation.   Mintox 662-947-65 MG/5ML suspension Generic drug: alum & mag hydroxide-simeth Take 30 mLs by mouth 3 (three) times daily as needed for indigestion or heartburn.   nitroGLYCERIN 0.4 MG SL tablet Commonly known as: NITROSTAT Place 0.4 mg under the tongue every 5 (five) minutes as needed for chest pain.   Nucynta 50 MG tablet Generic drug: tapentadol Take 50 mg by mouth 3 (three) times daily.   nystatin cream Commonly known as: MYCOSTATIN Apply 1 application topically as needed (panniculitis). Apply to pannus   omeprazole 20 MG capsule Commonly known as: PRILOSEC TAKE 1 CAPSULE BY MOUTH EVERY DAY. What  changed:   how much to take  how to take this  when to take this  additional instructions   oxybutynin 5 MG tablet Commonly known as: DITROPAN TAKE ONE TABLET BY MOUTH EVERY DAY   polyethylene glycol 17 g packet Commonly known as: MIRALAX / GLYCOLAX Take 17 g by mouth daily.   propranolol 40 MG tablet Commonly known as: INDERAL Take 1 tablet (40 mg total) by mouth daily.   sertraline 100 MG tablet Commonly known as: ZOLOFT TAKE ONE TABLET BY MOUTH EVERY DAY What changed:   how much to take  how to take this  when to take this  additional instructions   verapamil 240 MG 24 hr capsule Commonly known as: VERELAN PM Take 240 mg by mouth at bedtime.   Vitamin D3 50 MCG (2000 UT) capsule Take 2,000 Units by mouth daily.      DISCHARGE INSTRUCTIONS:   DIET:  Cardiac diet DISCHARGE CONDITION:  Stable ACTIVITY:  Activity as tolerated OXYGEN:  Home Oxygen: No.  Oxygen Delivery: room air DISCHARGE LOCATION:  [place], OT to follow   If you experience worsening of your  admission symptoms, develop shortness of breath, life threatening emergency, suicidal or homicidal thoughts you must seek medical attention immediately by calling 911 or calling your MD immediately  if symptoms less severe.  You Must read complete instructions/literature along with all the possible adverse reactions/side effects for all the Medicines you take and that have been prescribed to you. Take any new Medicines after you have completely understood and accpet all the possible adverse reactions/side effects.   Please note  You were cared for by a hospitalist during your hospital stay. If you have any questions about your discharge medications or the care you received while you were in the hospital after you are discharged, you can call the unit and asked to speak with the hospitalist on call if the hospitalist that took care of you is not available. Once you are discharged, your  primary care physician will handle any further medical issues. Please note that NO REFILLS for any discharge medications will be authorized once you are discharged, as it is imperative that you return to your primary care physician (or establish a relationship with a primary care physician if you do not have one) for your aftercare needs so that they can reassess your need for medications and monitor your lab values.    On the day of Discharge:  VITAL SIGNS:  Blood pressure (!) 155/58, pulse 78, temperature 98.8 F (37.1 C), temperature source Oral, resp. rate 17, height 5\' 3"  (1.6 m), weight 67.1 kg, SpO2 97 %. PHYSICAL EXAMINATION:  GENERAL:  86 y.o.-year-old patient lying in the bed with no acute distress.  EYES: Pupils equal, round, reactive to light and accommodation. No scleral icterus. Extraocular muscles intact.  HEENT: Head atraumatic, normocephalic. Oropharynx and nasopharynx clear.  NECK:  Supple, no jugular venous distention. No thyroid enlargement, no tenderness. C. Collar in place LUNGS: Normal breath sounds bilaterally, no wheezing, rales,rhonchi or crepitation. No use of accessory muscles of respiration.  CARDIOVASCULAR: S1, S2 normal. No murmurs, rubs, or gallops.  ABDOMEN: Soft, non-tender, non-distended. Bowel sounds present. No organomegaly or mass.  EXTREMITIES: No pedal edema, cyanosis, or clubbing.  NEUROLOGIC: Cranial nerves II through XII are intact. Muscle strength 5/5 in all extremities. Sensation intact. Gait not checked.  PSYCHIATRIC: The patient is alert and oriented x 3.  SKIN: No obvious rash, lesion, or ulcer.  DATA REVIEW:   CBC Recent Labs  Lab 03/17/20 0704  WBC 10.1  HGB 10.9*  HCT 33.5*  PLT 138*    Chemistries  Recent Labs  Lab 03/16/20 1429 03/16/20 1429 03/17/20 0704  NA 138   < > 138  K 4.0   < > 3.8  CL 102   < > 106  CO2 27   < > 23  GLUCOSE 108*   < > 100*  BUN 38*   < > 35*  CREATININE 1.26*   < > 1.07*  CALCIUM 8.9   < > 8.1*   AST 30  --   --   ALT 19  --   --   ALKPHOS 57  --   --   BILITOT 1.2  --   --    < > = values in this interval not displayed.     Outpatient follow-up  Follow-up Information    [facility] Schedule an appointment as soon as possible for a visit in 1 week(s).   Specialty: Geriatric Medicine Contact information: [address] [place] [address] [phone]  Management plans discussed with the patient, family (son at bedside) and they are in agreement.  CODE STATUS: DNR   TOTAL TIME TAKING CARE OF THIS PATIENT: 45 minutes.    Max Sane M.D on 03/18/2020 at 11:13 AM  [facility]   CC: Primary care physician; [facility]   Note: This dictation was prepared with Dragon dictation along with smaller phrase technology. Any transcriptional errors that result from this process are unintentional.

## 2020-03-18 NOTE — [place] Notes (Signed)
Report given to [place] from [employer] at 1520.  All questions answered.

## 2020-03-18 NOTE — Progress Notes (Signed)
Discharge packet given to son to take to [facility]. Son had no questions regarding discharge.  Right skin tear occurred while removing IV from right forearm.Mepitel and foam dressing applied. Pt and son made aware. Facility notified by nurse that is assigned to this pt. Pt escorted out by nurse tech via wheelchair

## 2020-03-18 NOTE — Progress Notes (Signed)
PHARMACY - PHYSICIAN COMMUNICATION CRITICAL VALUE ALERT - BLOOD CULTURE IDENTIFICATION (BCID)  Cindy Graham is an 86 y.o. female who presented to [facility] on 03/16/2020 with a chief complaint of fever and dysuria  Assessment:  Patient from ALF, concerns for UTI.  Urine culture with multiple organisms, Blood culture with GNR in 1/2 sets. E. Coli detected on BCID (No resistant genes detected)  Name of physician (or Provider) Contacted: Dr Manuella Ghazi   Current antibiotics: Meropenem  Changes to prescribed antibiotics recommended:  Recommendations accepted by provider - change to ceftriaxone since ESBL gene not detected  Results for orders placed or performed during the hospital encounter of 03/16/20  Blood Culture ID Panel (Reflexed) (Collected: 03/16/2020  2:29 PM)  Result Value Ref Range   Enterococcus faecalis NOT DETECTED NOT DETECTED   Enterococcus Faecium NOT DETECTED NOT DETECTED   Listeria monocytogenes NOT DETECTED NOT DETECTED   Staphylococcus species NOT DETECTED NOT DETECTED   Staphylococcus aureus (BCID) NOT DETECTED NOT DETECTED   Staphylococcus epidermidis NOT DETECTED NOT DETECTED   Staphylococcus lugdunensis NOT DETECTED NOT DETECTED   Streptococcus species NOT DETECTED NOT DETECTED   Streptococcus agalactiae NOT DETECTED NOT DETECTED   Streptococcus pneumoniae NOT DETECTED NOT DETECTED   Streptococcus pyogenes NOT DETECTED NOT DETECTED   A.calcoaceticus-baumannii NOT DETECTED NOT DETECTED   Bacteroides fragilis NOT DETECTED NOT DETECTED   Enterobacterales DETECTED (A) NOT DETECTED   Enterobacter cloacae complex NOT DETECTED NOT DETECTED   Escherichia coli DETECTED (A) NOT DETECTED   Klebsiella aerogenes NOT DETECTED NOT DETECTED   Klebsiella oxytoca NOT DETECTED NOT DETECTED   Klebsiella pneumoniae NOT DETECTED NOT DETECTED   Proteus species NOT DETECTED NOT DETECTED   Salmonella species NOT DETECTED NOT DETECTED   Serratia marcescens NOT DETECTED NOT DETECTED    Haemophilus influenzae NOT DETECTED NOT DETECTED   Neisseria meningitidis NOT DETECTED NOT DETECTED   Pseudomonas aeruginosa NOT DETECTED NOT DETECTED   Stenotrophomonas maltophilia NOT DETECTED NOT DETECTED   Candida albicans NOT DETECTED NOT DETECTED   Candida auris NOT DETECTED NOT DETECTED   Candida glabrata NOT DETECTED NOT DETECTED   Candida krusei NOT DETECTED NOT DETECTED   Candida parapsilosis NOT DETECTED NOT DETECTED   Candida tropicalis NOT DETECTED NOT DETECTED   Cryptococcus neoformans/gattii NOT DETECTED NOT DETECTED   CTX-M ESBL NOT DETECTED NOT DETECTED   Carbapenem resistance IMP NOT DETECTED NOT DETECTED   Carbapenem resistance KPC NOT DETECTED NOT DETECTED   Carbapenem resistance NDM NOT DETECTED NOT DETECTED   Carbapenem resist OXA 48 LIKE NOT DETECTED NOT DETECTED   Carbapenem resistance VIM NOT DETECTED NOT DETECTED    Doreene Eland, PharmD, BCPS.   Work Cell: [phone] 03/18/2020 8:26 AM

## 2020-03-20 LAB — CULTURE, BLOOD (ROUTINE X 2)

## 2020-03-21 LAB — CULTURE, BLOOD (ROUTINE X 2): Culture: NO GROWTH

## 2020-04-13 ENCOUNTER — Other Ambulatory Visit (HOSPITAL_COMMUNITY): Payer: Self-pay | Admitting: Neurosurgery

## 2020-04-13 ENCOUNTER — Other Ambulatory Visit: Payer: Self-pay | Admitting: Neurosurgery

## 2020-04-13 DIAGNOSIS — S12191D Other nondisplaced fracture of second cervical vertebra, subsequent encounter for fracture with routine healing: Secondary | ICD-10-CM

## 2020-04-29 ENCOUNTER — Ambulatory Visit
Admission: RE | Admit: 2020-04-29 | Discharge: 2020-04-29 | Disposition: A | Discharge status: Home / Self Care | Payer: Medicare Other | Source: Ambulatory Visit | Attending: Neurosurgery | Admitting: Neurosurgery

## 2020-04-29 ENCOUNTER — Other Ambulatory Visit: Payer: Self-pay

## 2020-04-29 DIAGNOSIS — S12191D Other nondisplaced fracture of second cervical vertebra, subsequent encounter for fracture with routine healing: Secondary | ICD-10-CM | POA: Diagnosis present

## 2020-05-20 ENCOUNTER — Emergency Department
Admission: EM | Admit: 2020-05-20 | Discharge: 2020-05-20 | Disposition: A | Discharge status: Home / Self Care | Payer: Medicare Other | Attending: Emergency Medicine | Admitting: Emergency Medicine

## 2020-05-20 ENCOUNTER — Emergency Department: Payer: Medicare Other

## 2020-05-20 DIAGNOSIS — W1811XA Fall from or off toilet without subsequent striking against object, initial encounter: Secondary | ICD-10-CM | POA: Insufficient documentation

## 2020-05-20 DIAGNOSIS — Z79899 Other long term (current) drug therapy: Secondary | ICD-10-CM | POA: Diagnosis not present

## 2020-05-20 DIAGNOSIS — J449 Chronic obstructive pulmonary disease, unspecified: Secondary | ICD-10-CM | POA: Diagnosis not present

## 2020-05-20 DIAGNOSIS — Y92002 Bathroom of unspecified non-institutional (private) residence single-family (private) house as the place of occurrence of the external cause: Secondary | ICD-10-CM | POA: Insufficient documentation

## 2020-05-20 DIAGNOSIS — S51812A Laceration without foreign body of left forearm, initial encounter: Secondary | ICD-10-CM | POA: Diagnosis not present

## 2020-05-20 DIAGNOSIS — N1831 Chronic kidney disease, stage 3a: Secondary | ICD-10-CM | POA: Diagnosis not present

## 2020-05-20 DIAGNOSIS — Z7982 Long term (current) use of aspirin: Secondary | ICD-10-CM | POA: Diagnosis not present

## 2020-05-20 DIAGNOSIS — J45909 Unspecified asthma, uncomplicated: Secondary | ICD-10-CM | POA: Insufficient documentation

## 2020-05-20 DIAGNOSIS — T148XXA Other injury of unspecified body region, initial encounter: Secondary | ICD-10-CM

## 2020-05-20 DIAGNOSIS — S51811A Laceration without foreign body of right forearm, initial encounter: Secondary | ICD-10-CM | POA: Insufficient documentation

## 2020-05-20 DIAGNOSIS — W19XXXA Unspecified fall, initial encounter: Secondary | ICD-10-CM

## 2020-05-20 DIAGNOSIS — I129 Hypertensive chronic kidney disease with stage 1 through stage 4 chronic kidney disease, or unspecified chronic kidney disease: Secondary | ICD-10-CM | POA: Diagnosis not present

## 2020-05-20 DIAGNOSIS — S59911A Unspecified injury of right forearm, initial encounter: Secondary | ICD-10-CM | POA: Diagnosis present

## 2020-05-20 DIAGNOSIS — S0990XA Unspecified injury of head, initial encounter: Secondary | ICD-10-CM | POA: Insufficient documentation

## 2020-05-20 NOTE — ED Notes (Signed)
Called son Jesusita Oka as he had questions about the CT scan.  Explained results to him.  He also said she had told him she was not urinating.  I told him that I had changed her diaper and it was very full, and then she had voided large amount on a bedpan and it was clear.

## 2020-05-20 NOTE — ED Provider Notes (Signed)
[facility] Emergency Department Provider Note   ____________________________________________    I have reviewed the triage vital signs and the nursing notes.   HISTORY  Chief Complaint Fall     HPI Cindy Graham is a 86 y.o. female with extensive past medical history as detailed below who presents after fall.  Patient apparently fell asleep on the toilet and fell to the side.  Complains of head injury and is concerned for possible neck injury, patient reports that she previously broke her neck and only recently had her collar off.   Past Medical History:  Diagnosis Date  . Anxiety   . Arthritis    osteo  . Asthma   . COPD (chronic obstructive pulmonary disease) ([place])   . GERD (gastroesophageal reflux disease)   . Heart murmur   . Hernia   . Hyperlipidemia   . Hypertension   . Melanoma ([place])    face  . Syncope and collapse   . Urine incontinence     Patient Active Problem List   Diagnosis Date Noted  . Tachycardia   . Sepsis ([place]) 03/17/2020  . Severe sepsis ([place]) 03/16/2020  . COPD (chronic obstructive pulmonary disease) ([place])   . Elevated troponin   . Displaced intertrochanteric fracture of right femur, initial encounter for closed fracture ([place])   . Acute kidney injury (AKI) with acute tubular necrosis (ATN) ([place]) 02/27/2019  . 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 Cellulitis 06/12/2018  . Hyponatremia 11/24/2017  . Headache 11/24/2017  . Wound of left leg 11/01/2017  . Constipation 09/05/2017  . Other fatigue 12/22/2016  . Tremor 03/29/2016  . Amaurosis fugax 12/25/2015  . Asthma 09/17/2015  . PVD (peripheral vascular disease) ([place]) 05/28/2015  . B12 deficiency 08/29/2014  . Chronic back pain greater than 3 months duration 04/23/2013  . Acute renal failure superimposed on stage 3a chronic kidney disease ([place]) 04/12/2012  . Hyperlipidemia LDL goal < 100 03/05/2012  . UTI (urinary tract infection) 07/26/2011  .  Osteoarthritis 07/06/2011  . Hypertension 02/21/2011  . Depression/Anxiety 02/21/2011    Past Surgical History:  Procedure Laterality Date  . ABDOMINAL HYSTERECTOMY    . AMPUTATION TOE Right 06/14/2018   Procedure: AMPUTATION TOE - RIGHT 5TH;  Surgeon: Albertine Patricia, DPM;  Location: [facility] ORS;  Service: Podiatry;  Laterality: Right;  . CHOLECYSTECTOMY    . HERNIA REPAIR    . INTRAMEDULLARY (IM) NAIL INTERTROCHANTERIC Right 06/22/2019   Procedure: INTRAMEDULLARY (IM) NAIL INTERTROCHANTRIC;  Surgeon: Leandrew Koyanagi, MD;  Location: [place];  Service: Orthopedics;  Laterality: Right;    Prior to Admission medications   Medication Sig Start Date End Date Taking? Authorizing Provider  acetaminophen (TYLENOL) 325 MG tablet Take 650 mg by mouth 2 (two) times daily.    [provider]  acetaminophen (TYLENOL) 500 MG tablet Take 500 mg by mouth every 6 (six) hours as needed for mild pain or moderate pain.     [provider]  alendronate (FOSAMAX) 70 MG tablet Take 70 mg by mouth every Thursday.     [provider]  alum & mag hydroxide-simeth (MINTOX) 200-200-20 MG/5ML suspension Take 30 mLs by mouth 3 (three) times daily as needed for indigestion or heartburn.     [provider]  aspirin 325 MG tablet Take 1 tablet (325 mg total) by mouth daily. 06/25/19   Regalado, Belkys A, MD  Cholecalciferol (VITAMIN D3) 50 MCG (2000 UT) capsule Take 2,000 Units by mouth daily.  [provider]  enalapril (VASOTEC) 20 MG tablet Take 20 mg by mouth daily. 01/31/20   [provider]  feeding supplement, ENSURE ENLIVE, (ENSURE ENLIVE) LIQD Take 237 mLs by mouth 2 (two) times daily between meals. 06/25/19   Regalado, Belkys A, MD  gabapentin (NEURONTIN) 300 MG capsule Take 1 capsule (300 mg total) by mouth at bedtime. Patient taking differently: Take 300 mg by mouth daily.  12/06/17   Leone Haven, MD  guaifenesin (ROBITUSSIN) 100 MG/5ML syrup Take 200 mg by  mouth every 6 (six) hours as needed for cough.     [provider]  loperamide (IMODIUM) 2 MG capsule Take 2 mg by mouth 4 (four) times daily as needed for diarrhea or loose stools.     [provider]  magnesium hydroxide (MILK OF MAGNESIA) 400 MG/5ML suspension Take 30 mLs by mouth daily as needed for mild constipation.    [provider]  Multiple Vitamin (DAILY VITES) tablet Take 1 tablet by mouth daily.    [provider]  nitroGLYCERIN (NITROSTAT) 0.4 MG SL tablet Place 0.4 mg under the tongue every 5 (five) minutes as needed for chest pain.    [provider]  nystatin cream (MYCOSTATIN) Apply 1 application topically as needed (panniculitis). Apply to pannus    [provider]  omeprazole (PRILOSEC) 20 MG capsule TAKE 1 CAPSULE BY MOUTH EVERY DAY. Patient taking differently: Take 20 mg by mouth daily.  11/15/17   Leone Haven, MD  oxybutynin (DITROPAN) 5 MG tablet TAKE ONE TABLET BY MOUTH EVERY DAY Patient taking differently: Take 5 mg by mouth daily.  09/15/17   Leone Haven, MD  polyethylene glycol (MIRALAX / GLYCOLAX) 17 g packet Take 17 g by mouth daily. 06/26/19   Regalado, Belkys A, MD  propranolol (INDERAL) 40 MG tablet Take 1 tablet (40 mg total) by mouth daily. 06/26/19   Regalado, Belkys A, MD  sertraline (ZOLOFT) 100 MG tablet TAKE ONE TABLET BY MOUTH EVERY DAY Patient taking differently: Take 100 mg by mouth daily.  11/15/17   Leone Haven, MD  tapentadol (NUCYNTA) 50 MG tablet Take 50 mg by mouth 3 (three) times daily.    [provider]  verapamil (VERELAN PM) 240 MG 24 hr capsule Take 240 mg by mouth at bedtime.    [provider]     Allergies Statins, Tramadol, Levaquin [levofloxacin in d5w], Neomycin-polymyxin-gramicidin, Penicillins, Sulfa antibiotics, and Tetracyclines & related  Family History  Problem Relation Age of Onset  . Stroke Father   . Heart disease Sister   . Cancer  Brother        colon  . Heart disease Brother   . Cancer Sister        colon  . Heart attack Brother   . Heart attack Son   . Parkinson's disease Son     Social History Social History   Tobacco Use  . Smoking status: Never Smoker  . Smokeless tobacco: Never Used  Substance Use Topics  . Alcohol use: No  . Drug use: No    Review of Systems  Constitutional: No fever/chills Eyes: No visual changes.  ENT: No facial injury Cardiovascular: Denies chest pain. Respiratory: Denies shortness of breath. Gastrointestinal: No abdominal pain.  No nausea, no vomiting.   Genitourinary: Negative for dysuria. Musculoskeletal: As above Skin: Skin tears Neurological: Negative for severe headache, no neuro deficits   ____________________________________________   PHYSICAL EXAM:  VITAL SIGNS: ED Triage Vitals  Enc Vitals Group     BP 05/20/20 0316 (!) 125/46     Pulse Rate 05/20/20 0316 80     Resp 05/20/20 0316 16     Temp 05/20/20 0316 97.9 F (36.6 C)     Temp Source 05/20/20 0316 Oral     SpO2 05/20/20 0316 96 %     Weight --      Height --      Head Circumference --      Peak Flow --      Pain Score 05/20/20 0317 3     Pain Loc --      Pain Edu? --      Excl. in [place]? --     Constitutional: Alert and oriented. Eyes: Conjunctivae are normal.  Head: No significant traumatic findings Nose: No congestion/rhinnorhea. Mouth/Throat: Mucous membranes are moist.   Neck: No significant pain with axial load, no tenderness to palpation Cardiovascular: Normal rate, regular rhythm.  Good peripheral circulation.  No chest wall tenderness palpation Respiratory: Normal respiratory effort.  No retractions. Lungs CTAB. Gastrointestinal: Soft and nontender. No distention.    Musculoskeletal: Full range of motion of all extremities, warm and well perfused Neurologic:  Normal speech and language. No gross focal neurologic deficits are appreciated.  Skin:  Skin is warm, dry.  Skin tears  noted to the forearms Psychiatric: Mood and affect are normal. Speech and behavior are normal.  ____________________________________________   LABS (all labs ordered are listed, but only abnormal results are displayed)  Labs Reviewed - No data to display ____________________________________________  EKG  None ____________________________________________  RADIOLOGY  CT head and cervical spine no acute abnormalities ____________________________________________   PROCEDURES  Procedure(s) performed: No  Procedures   Critical Care performed: No ____________________________________________   INITIAL IMPRESSION / ASSESSMENT AND PLAN / ED COURSE  Pertinent labs & imaging results that were available during my care of the patient were reviewed by me and considered in my medical decision making (see chart for details).  Patient presents after fall from toilet, she is overall well-appearing here in the emergency department.  She is greatly relieved to hear that CT head and cervical spine are unremarkable given that she recently recovered from cervical spine fractures.  Otherwise exam is quite reassuring, appropriate for discharge at this time with outpatient follow-up as needed.    ____________________________________________   FINAL CLINICAL IMPRESSION(S) / ED DIAGNOSES  Final diagnoses:  Fall, initial encounter  Injury of head, initial encounter  Multiple skin tears        Note:  This document was prepared using Dragon voice recognition software and may include unintentional dictation errors.   Jene Every, MD 05/20/20 1040

## 2020-05-20 NOTE — ED Notes (Signed)
On bed pan to void.  Had very wet pad and diaper on.

## 2020-05-20 NOTE — ED Triage Notes (Signed)
PT to ED via EMS from [facility] after falling asleep while on the toilet and falling onto the ground. Pt has skin tears on both sides of her arms and pain reported in her neck. Pt had a broken neck earlier this year and reports the brace was removed two weeks ago. Pt was pain free in her neck until the fall tonight. PT reports she also hit the back of her head hard on the ground with pain at this time. No LOC. No blood thinners.

## 2020-09-20 ENCOUNTER — Emergency Department: Payer: Medicare Other

## 2020-09-20 ENCOUNTER — Emergency Department
Admission: EM | Admit: 2020-09-20 | Discharge: 2020-09-20 | Payer: Medicare Other | Attending: Emergency Medicine | Admitting: Emergency Medicine

## 2020-09-20 ENCOUNTER — Other Ambulatory Visit: Payer: Self-pay

## 2020-09-20 ENCOUNTER — Encounter: Payer: Self-pay | Admitting: Emergency Medicine

## 2020-09-20 DIAGNOSIS — Z79899 Other long term (current) drug therapy: Secondary | ICD-10-CM | POA: Diagnosis not present

## 2020-09-20 DIAGNOSIS — Z7982 Long term (current) use of aspirin: Secondary | ICD-10-CM | POA: Diagnosis not present

## 2020-09-20 DIAGNOSIS — U071 COVID-19: Secondary | ICD-10-CM | POA: Diagnosis not present

## 2020-09-20 DIAGNOSIS — J45909 Unspecified asthma, uncomplicated: Secondary | ICD-10-CM | POA: Diagnosis not present

## 2020-09-20 DIAGNOSIS — I129 Hypertensive chronic kidney disease with stage 1 through stage 4 chronic kidney disease, or unspecified chronic kidney disease: Secondary | ICD-10-CM | POA: Insufficient documentation

## 2020-09-20 DIAGNOSIS — J449 Chronic obstructive pulmonary disease, unspecified: Secondary | ICD-10-CM | POA: Diagnosis not present

## 2020-09-20 DIAGNOSIS — R059 Cough, unspecified: Secondary | ICD-10-CM | POA: Diagnosis present

## 2020-09-20 DIAGNOSIS — N1831 Chronic kidney disease, stage 3a: Secondary | ICD-10-CM | POA: Insufficient documentation

## 2020-09-20 LAB — COMPREHENSIVE METABOLIC PANEL
ALT: 16 U/L (ref 0–44)
AST: 27 U/L (ref 15–41)
Albumin: 3.3 g/dL — ABNORMAL LOW (ref 3.5–5.0)
Alkaline Phosphatase: 56 U/L (ref 38–126)
Anion gap: 8 (ref 5–15)
BUN: 27 mg/dL — ABNORMAL HIGH (ref 8–23)
CO2: 28 mmol/L (ref 22–32)
Calcium: 8.8 mg/dL — ABNORMAL LOW (ref 8.9–10.3)
Chloride: 103 mmol/L (ref 98–111)
Creatinine, Ser: 0.89 mg/dL (ref 0.44–1.00)
GFR, Estimated: 59 mL/min — ABNORMAL LOW (ref >60)
Glucose, Bld: 100 mg/dL — ABNORMAL HIGH (ref 70–99)
Potassium: 4.1 mmol/L (ref 3.5–5.1)
Sodium: 139 mmol/L (ref 135–145)
Total Bilirubin: 0.7 mg/dL (ref 0.3–1.2)
Total Protein: 6.9 g/dL (ref 6.5–8.1)

## 2020-09-20 LAB — PROCALCITONIN: Procalcitonin: <0.1 ng/mL

## 2020-09-20 LAB — CBC WITH DIFFERENTIAL/PLATELET
Abs Immature Granulocytes: 0.01 10*3/uL (ref 0.00–0.07)
Basophils Absolute: 0 10*3/uL (ref 0.0–0.1)
Basophils Relative: 0 %
Eosinophils Absolute: 0.1 10*3/uL (ref 0.0–0.5)
Eosinophils Relative: 2 %
HCT: 38.7 % (ref 36.0–46.0)
Hemoglobin: 12.3 g/dL (ref 12.0–15.0)
Immature Granulocytes: 0 %
Lymphocytes Relative: 44 %
Lymphs Abs: 1.9 10*3/uL (ref 0.7–4.0)
MCH: 31 pg (ref 26.0–34.0)
MCHC: 31.8 g/dL (ref 30.0–36.0)
MCV: 97.5 fL (ref 80.0–100.0)
Monocytes Absolute: 0.5 10*3/uL (ref 0.1–1.0)
Monocytes Relative: 11 %
Neutro Abs: 1.9 10*3/uL (ref 1.7–7.7)
Neutrophils Relative %: 43 %
Platelets: 180 10*3/uL (ref 150–400)
RBC: 3.97 MIL/uL (ref 3.87–5.11)
RDW: 14.8 % (ref 11.5–15.5)
WBC: 4.3 10*3/uL (ref 4.0–10.5)
nRBC: 0 % (ref 0.0–0.2)

## 2020-09-20 LAB — BRAIN NATRIURETIC PEPTIDE: B Natriuretic Peptide: 179.8 pg/mL — ABNORMAL HIGH (ref 0.0–100.0)

## 2020-09-20 LAB — D-DIMER, QUANTITATIVE: D-Dimer, Quant: 0.84 ug/mL-FEU — ABNORMAL HIGH (ref 0.00–0.50)

## 2020-09-20 LAB — TROPONIN I (HIGH SENSITIVITY): Troponin I (High Sensitivity): 8 ng/L (ref <18)

## 2020-09-20 LAB — RESP PANEL BY RT-PCR (FLU A&B, COVID) ARPGX2
Influenza A by PCR: NEGATIVE
Influenza B by PCR: NEGATIVE
SARS Coronavirus 2 by RT PCR: POSITIVE — AB

## 2020-09-20 NOTE — ED Notes (Signed)
Report called to Edison Nasuti at [facility]. Med Necessity and d/c instructions  given to secretary to arrange transport home.

## 2020-09-20 NOTE — ED Provider Notes (Signed)
[facility] Emergency Department Provider Note  ____________________________________________   Event Date/Time   First MD Initiated Contact with Patient 09/20/20 1206     (approximate)  I have reviewed the triage vital signs and the nursing notes.   HISTORY  Chief Complaint Cough   HPI TONDRA REIERSON is a 87 y.o. female with past medical history of anxiety, arthritis, asthma, COPD, GERD, HTN, HDL, melanoma and chronic urinary incontinence who presents for assessment of cough and congestion that has been ongoing over the last 3 to 4 days.  Patient denies any headache, earache, sore throat, fevers, chest pain, shortness of breath, abdominal pain, back pain, nausea, vomiting, diarrhea, dysuria, rash or any other acute sick symptoms.  No clear leaving aggravating factors.  She has not taken any medication for symptoms..       Past Medical History:  Diagnosis Date  . Anxiety   . Arthritis    osteo  . Asthma   . COPD (chronic obstructive pulmonary disease) ([place])   . GERD (gastroesophageal reflux disease)   . Heart murmur   . Hernia   . Hyperlipidemia   . Hypertension   . Melanoma ([place])    face  . Syncope and collapse   . Urine incontinence     Patient Active Problem List   Diagnosis Date Noted  . Tachycardia   . Sepsis ([place]) 03/17/2020  . Severe sepsis ([place]) 03/16/2020  . COPD (chronic obstructive pulmonary disease) ([place])   . Elevated troponin   . Displaced intertrochanteric fracture of right femur, initial encounter for closed fracture ([place])   . Acute kidney injury (AKI) with acute tubular necrosis (ATN) ([place]) 02/27/2019  . 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 Cellulitis 06/12/2018  . Hyponatremia 11/24/2017  . Headache 11/24/2017  . Wound of left leg 11/01/2017  . Constipation 09/05/2017  . Other fatigue 12/22/2016  . Tremor 03/29/2016  . Amaurosis fugax 12/25/2015  . Asthma 09/17/2015  . PVD (peripheral vascular disease) ([place])  05/28/2015  . B12 deficiency 08/29/2014  . Chronic back pain greater than 3 months duration 04/23/2013  . Acute renal failure superimposed on stage 3a chronic kidney disease ([place]) 04/12/2012  . Hyperlipidemia LDL goal < 100 03/05/2012  . UTI (urinary tract infection) 07/26/2011  . Osteoarthritis 07/06/2011  . Hypertension 02/21/2011  . Depression/Anxiety 02/21/2011    Past Surgical History:  Procedure Laterality Date  . ABDOMINAL HYSTERECTOMY    . AMPUTATION TOE Right 06/14/2018   Procedure: AMPUTATION TOE - RIGHT 5TH;  Surgeon: Albertine Patricia, DPM;  Location: [facility] ORS;  Service: Podiatry;  Laterality: Right;  . CHOLECYSTECTOMY    . HERNIA REPAIR    . INTRAMEDULLARY (IM) NAIL INTERTROCHANTERIC Right 06/22/2019   Procedure: INTRAMEDULLARY (IM) NAIL INTERTROCHANTRIC;  Surgeon: Leandrew Koyanagi, MD;  Location: [place];  Service: Orthopedics;  Laterality: Right;    Prior to Admission medications   Medication Sig Start Date End Date Taking? Authorizing Provider  acetaminophen (TYLENOL) 325 MG tablet Take 650 mg by mouth 2 (two) times daily.    [provider]  acetaminophen (TYLENOL) 500 MG tablet Take 500 mg by mouth every 6 (six) hours as needed for mild pain or moderate pain.     [provider]  alendronate (FOSAMAX) 70 MG tablet Take 70 mg by mouth every Thursday.     [provider]  alum & mag hydroxide-simeth (MINTOX) 200-200-20 MG/5ML suspension Take 30 mLs by mouth 3 (three) times daily as needed for indigestion or  heartburn.     [provider]  aspirin 325 MG tablet Take 1 tablet (325 mg total) by mouth daily. 06/25/19   Regalado, Belkys A, MD  Cholecalciferol (VITAMIN D3) 50 MCG (2000 UT) capsule Take 2,000 Units by mouth daily.    [provider]  enalapril (VASOTEC) 20 MG tablet Take 20 mg by mouth daily. 01/31/20   [provider]  feeding supplement, ENSURE ENLIVE, (ENSURE ENLIVE) LIQD Take 237 mLs by mouth 2 (two) times daily  between meals. 06/25/19   Regalado, Belkys A, MD  gabapentin (NEURONTIN) 300 MG capsule Take 1 capsule (300 mg total) by mouth at bedtime. Patient taking differently: Take 300 mg by mouth daily.  12/06/17   Leone Haven, MD  guaifenesin (ROBITUSSIN) 100 MG/5ML syrup Take 200 mg by mouth every 6 (six) hours as needed for cough.     [provider]  loperamide (IMODIUM) 2 MG capsule Take 2 mg by mouth 4 (four) times daily as needed for diarrhea or loose stools.     [provider]  magnesium hydroxide (MILK OF MAGNESIA) 400 MG/5ML suspension Take 30 mLs by mouth daily as needed for mild constipation.    [provider]  Multiple Vitamin (DAILY VITES) tablet Take 1 tablet by mouth daily.    [provider]  nitroGLYCERIN (NITROSTAT) 0.4 MG SL tablet Place 0.4 mg under the tongue every 5 (five) minutes as needed for chest pain.    [provider]  nystatin cream (MYCOSTATIN) Apply 1 application topically as needed (panniculitis). Apply to pannus    [provider]  omeprazole (PRILOSEC) 20 MG capsule TAKE 1 CAPSULE BY MOUTH EVERY DAY. Patient taking differently: Take 20 mg by mouth daily.  11/15/17   Leone Haven, MD  oxybutynin (DITROPAN) 5 MG tablet TAKE ONE TABLET BY MOUTH EVERY DAY Patient taking differently: Take 5 mg by mouth daily.  09/15/17   Leone Haven, MD  polyethylene glycol (MIRALAX / GLYCOLAX) 17 g packet Take 17 g by mouth daily. 06/26/19   Regalado, Belkys A, MD  propranolol (INDERAL) 40 MG tablet Take 1 tablet (40 mg total) by mouth daily. 06/26/19   Regalado, Belkys A, MD  sertraline (ZOLOFT) 100 MG tablet TAKE ONE TABLET BY MOUTH EVERY DAY Patient taking differently: Take 100 mg by mouth daily.  11/15/17   Leone Haven, MD  tapentadol (NUCYNTA) 50 MG tablet Take 50 mg by mouth 3 (three) times daily.    [provider]  verapamil (VERELAN PM) 240 MG 24 hr capsule Take 240 mg by mouth at bedtime.    [provider]    Allergies Statins, Tramadol, Levaquin [levofloxacin in d5w], Neomycin-polymyxin-gramicidin, Penicillins, Sulfa antibiotics, and Tetracyclines & related  Family History  Problem Relation Age of Onset  . Stroke Father   . Heart disease Sister   . Cancer Brother        colon  . Heart disease Brother   . Cancer Sister        colon  . Heart attack Brother   . Heart attack Son   . Parkinson's disease Son     Social History Social History   Tobacco Use  . Smoking status: Never Smoker  . Smokeless tobacco: Never Used  Substance Use Topics  . Alcohol use: No  . Drug use: No    Review of Systems  Review of Systems  Constitutional: Negative for chills and fever.  HENT: Positive for sinus pain. Negative  for sore throat.   Eyes: Negative for pain.  Respiratory: Positive for cough. Negative for stridor.   Cardiovascular: Negative for chest pain.  Gastrointestinal: Negative for vomiting.  Genitourinary: Negative for dysuria.  Musculoskeletal: Negative for myalgias.  Skin: Negative for rash.  Neurological: Negative for seizures, loss of consciousness and headaches.  Psychiatric/Behavioral: Negative for suicidal ideas.  All other systems reviewed and are negative.     ____________________________________________   PHYSICAL EXAM:  VITAL SIGNS: ED Triage Vitals  Enc Vitals Group     BP      Pulse      Resp      Temp      Temp src      SpO2      Weight      Height      Head Circumference      Peak Flow      Pain Score      Pain Loc      Pain Edu?      Excl. in [place]?    Vitals:   09/20/20 1300 09/20/20 1500  BP: (!) 168/75 (!) 181/50  Pulse: 71 65  Resp: 19 (!) 21  Temp:    SpO2: 95% 92%   Physical Exam Vitals and nursing note reviewed.  Constitutional:      General: She is not in acute distress.    Appearance: She is well-developed.  HENT:     Head: Normocephalic and atraumatic.     Right Ear: External ear normal.     Left Ear:  External ear normal.     Nose: Nose normal.  Eyes:     Conjunctiva/sclera: Conjunctivae normal.  Cardiovascular:     Rate and Rhythm: Normal rate and regular rhythm.     Heart sounds: No murmur heard.   Pulmonary:     Effort: Pulmonary effort is normal. No respiratory distress.     Breath sounds: Examination of the right-middle field reveals rhonchi. Examination of the right-lower field reveals rhonchi. Rhonchi present.  Abdominal:     Palpations: Abdomen is soft.     Tenderness: There is no abdominal tenderness.  Musculoskeletal:     Cervical back: Neck supple.     Right lower leg: No edema.     Left lower leg: No edema.  Skin:    General: Skin is warm and dry.     Capillary Refill: Capillary refill takes less than 2 seconds.  Neurological:     Mental Status: She is alert and oriented to person, place, and time.  Psychiatric:        Mood and Affect: Mood normal.      ____________________________________________   LABS (all labs ordered are listed, but only abnormal results are displayed)  Labs Reviewed  RESP PANEL BY RT-PCR (FLU A&B, COVID) ARPGX2 - Abnormal; Notable for the following components:      Result Value   SARS Coronavirus 2 by RT PCR POSITIVE (*)    All other components within normal limits  COMPREHENSIVE METABOLIC PANEL - Abnormal; Notable for the following components:   Glucose, Bld 100 (*)    BUN 27 (*)    Calcium 8.8 (*)    Albumin 3.3 (*)    GFR, Estimated 59 (*)    All other components within normal limits  BRAIN NATRIURETIC PEPTIDE - Abnormal; Notable for the following components:   B Natriuretic Peptide 179.8 (*)    All other components within normal limits  D-DIMER, QUANTITATIVE - Abnormal; Notable for  the following components:   D-Dimer, Quant 0.84 (*)    All other components within normal limits  CBC WITH DIFFERENTIAL/PLATELET  PROCALCITONIN  TROPONIN I (HIGH SENSITIVITY)  TROPONIN I (HIGH SENSITIVITY)    ____________________________________________  EKG  Sinus rhythm with ventricular rate of 77, unremarkable intervals, left axis deviation and some nonspecific changes in lead III without other clear evidence of acute ischemia or other significant delay arrhythmia. ____________________________________________  RADIOLOGY  ED MD interpretation: No full consolidation, effusion, significant edema pneumothorax or any other clear acute intrathoracic process.   Official radiology report(s): DG Chest 2 View  Result Date: 09/20/2020 CLINICAL DATA:  Cough EXAM: CHEST - 2 VIEW COMPARISON:  03/17/2020 FINDINGS: Elevation of the right diaphragm. No focal consolidation. No pleural effusion or pneumothorax. Heart and mediastinal contours are unremarkable. No acute osseous abnormality. Multiple right glenohumeral loose bodies. IMPRESSION: No active cardiopulmonary disease. Electronically Signed   By: Kathreen Devoid   On: 09/20/2020 14:05    ____________________________________________   PROCEDURES  Procedure(s) performed (including Critical Care):  .1-3 Lead EKG Interpretation Performed by: Lucrezia Starch, MD Authorized by: Lucrezia Starch, MD     Interpretation: normal     ECG rate assessment: normal     Rhythm: sinus rhythm     Ectopy: none     Conduction: normal       ____________________________________________   INITIAL IMPRESSION / ASSESSMENT AND PLAN / ED COURSE      Patient presents with above-stated history and exam for assessment of cough.  On arrival she is afebrile and hemodynamically stable.  Primary differential occludes but is not limited to pneumonia, thorax, symptomatic effusion, PE, ACS, arrhythmia, metabolic derangements and anemia.  While ECG has nonspecific findings given nonelevated troponin obtained.  3 hours after symptom onset I have a low suspicion for ACS or myocarditis.  CBC shows no leukocytosis or acute anemia.  CMP shows no significant electrolyte or  metabolic derangements.  BNP is 180 down from 806 months ago and given patient does not appear grossly volume overloaded on exam or x-ray lower suspicion for acute heart failure exacerbation at this time.  D-dimer of 0.84 is within age-adjusted limits and I have a low suspicion for PE at this time.  Chest x-ray shows no focal consolidations, large effusion or edema and Evalose patient for acute heart failure.  COVID is positive no lower suspicion for acute bacterial infection given absence of fever or leukocytosis.  Patient has no hypoxia and is breathing without significant effort on room air.  Given stable vitals with otherwise reassuring exam with him she is safe for discharge back to nursing facility.  Discharged in stable condition.  Strict return precautions advised and discussed.  Referral placed for [facility].         ____________________________________________   FINAL CLINICAL IMPRESSION(S) / ED DIAGNOSES  Final diagnoses:  COVID    Medications - No data to display   ED Discharge Orders         Ordered    Ambulatory referral for Covid Treatment        09/20/20 1413           Note:  This document was prepared using Dragon voice recognition software and may include unintentional dictation errors.   Lucrezia Starch, MD 09/20/20 [phone]

## 2020-09-20 NOTE — ED Notes (Signed)
Pt given snacks and gingerale.  Still awaiting transport.

## 2020-09-20 NOTE — ED Triage Notes (Signed)
Pt via EMS from [facility]. Pt complains of worsening cough for a few days now. Pt denies shortness of breath, fever, or chills. Pt states her niece visited her few days ago and niece notified her today that she was positive for covid. Pt reports cough is dry.

## 2020-09-20 NOTE — ED Notes (Signed)
IV catheter removed intact without complication.  D/C instructions discussed with pt, placed in packet to go back to [facility].  Pt advised of appointment with infusion clinic, and follow up, this was also discussed with Edison Nasuti at [facility]. All questions addressed.   Pt/ caregiver verbalized understanding.

## 2020-11-30 ENCOUNTER — Other Ambulatory Visit: Payer: Self-pay

## 2020-11-30 ENCOUNTER — Encounter: Payer: Self-pay | Admitting: Urology

## 2020-11-30 ENCOUNTER — Ambulatory Visit (INDEPENDENT_AMBULATORY_CARE_PROVIDER_SITE_OTHER): Payer: Medicare Other | Admitting: Urology

## 2020-11-30 VITALS — BP 177/67 | HR 65 | Ht 63.0 in | Wt 158.0 lb

## 2020-11-30 DIAGNOSIS — R300 Dysuria: Secondary | ICD-10-CM

## 2020-11-30 DIAGNOSIS — N3281 Overactive bladder: Secondary | ICD-10-CM | POA: Diagnosis not present

## 2020-11-30 DIAGNOSIS — N390 Urinary tract infection, site not specified: Secondary | ICD-10-CM

## 2020-11-30 LAB — URINALYSIS, COMPLETE
Bilirubin, UA: NEGATIVE
Glucose, UA: NEGATIVE
Ketones, UA: NEGATIVE
Nitrite, UA: NEGATIVE
Protein,UA: NEGATIVE
Specific Gravity, UA: 1.01 (ref 1.005–1.030)
Urobilinogen, Ur: 0.2 mg/dL (ref 0.2–1.0)
pH, UA: 5.5 (ref 5.0–7.5)

## 2020-11-30 LAB — MICROSCOPIC EXAMINATION

## 2020-11-30 MED ORDER — MIRABEGRON ER 25 MG PO TB24: 25.0000 mg | ORAL_TABLET | Freq: Every day | ORAL | 0 refills | Status: DC

## 2020-11-30 NOTE — Patient Instructions (Signed)
Please discontinue Oxybutynin and start the samples of Myrbetriq 25mg  1 tablet daily.

## 2020-11-30 NOTE — Progress Notes (Signed)
11/30/2020 2:27 PM   Clearnce Hasten 1921-12-13 [id number]  Referring provider: Virgie Dad, [address] [place],  [place] [address]  Chief Complaint  Patient presents with   Dysuria    HPI: Cindy Graham is a 87 y.o. referred for dysuria, lower urinary tract symptoms and recurrent UTI.  She presents today with her son who provided some of the history.  She has had problems for at least 3 years and her son thinks longer History of recurrent UTI and antibiotic intolerance Has mild dysuria which she states is not that bothersome Does have frequency, urgency and urge incontinence and presently on oxybutynin 5 mg daily Denies gross hematuria No febrile UTIs Denies flank, abdominal or pelvic pain   PMH: Past Medical History:  Diagnosis Date   Anxiety    Arthritis    osteo   Asthma    COPD (chronic obstructive pulmonary disease) (HCC)    GERD (gastroesophageal reflux disease)    Heart murmur    Hernia    Hyperlipidemia    Hypertension    Melanoma ([place])    face   Syncope and collapse    Urine incontinence     Surgical History: Past Surgical History:  Procedure Laterality Date   ABDOMINAL HYSTERECTOMY     AMPUTATION TOE Right 06/14/2018   Procedure: AMPUTATION TOE - RIGHT 5TH;  Surgeon: Albertine Patricia, DPM;  Location: [facility];  Service: Podiatry;  Laterality: Right;   CHOLECYSTECTOMY     HERNIA REPAIR     INTRAMEDULLARY (IM) NAIL INTERTROCHANTERIC Right 06/22/2019   Procedure: INTRAMEDULLARY (IM) NAIL INTERTROCHANTRIC;  Surgeon: Leandrew Koyanagi, MD;  Location: [facility];  Service: Orthopedics;  Laterality: Right;    Home Medications:  Allergies as of 11/30/2020       Reactions   Statins Other (See Comments)   Very bad muscle aches   Tramadol Itching   Levaquin [levofloxacin In D5w] Diarrhea, Other (See Comments)   Confusion, hallucinations   Neomycin-polymyxin-gramicidin Other (See Comments)   Penicillins    Tolerates cephalosporins Did it  involve swelling of the face/tongue/throat, SOB, or low BP? Unknown Did it involve sudden or severe rash/hives, skin peeling, or any reaction on the inside of your mouth or nose? Unknown Did you need to seek medical attention at a hospital or doctor's office? Unknown When did it last happen? Unknown If all above answers are "NO", may proceed with cephalosporin use.   Sulfa Antibiotics    Tetracyclines & Related         Medication List        Accurate as of November 30, 2020  2:27 PM. If you have any questions, ask your nurse or doctor.          acetaminophen 500 MG tablet Commonly known as: TYLENOL Take 500 mg by mouth every 6 (six) hours as needed for mild pain or moderate pain.   acetaminophen 325 MG tablet Commonly known as: TYLENOL Take 650 mg by mouth 2 (two) times daily.   alendronate 70 MG tablet Commonly known as: FOSAMAX Take 70 mg by mouth every Thursday.   alum & mag hydroxide-simeth 200-200-20 MG/5ML suspension Commonly known as: MAALOX/MYLANTA Take 30 mLs by mouth 3 (three) times daily as needed for indigestion or heartburn.   aspirin 325 MG tablet Take 1 tablet (325 mg total) by mouth daily.   Daily Vites tablet Take 1 tablet by mouth daily.   enalapril 20 MG tablet Commonly known as: VASOTEC Take  20 mg by mouth daily.   feeding supplement Liqd Take 237 mLs by mouth 2 (two) times daily between meals.   gabapentin 300 MG capsule Commonly known as: NEURONTIN Take 1 capsule (300 mg total) by mouth at bedtime. What changed: when to take this   guaifenesin 100 MG/5ML syrup Commonly known as: ROBITUSSIN Take 200 mg by mouth every 6 (six) hours as needed for cough.   loperamide 2 MG capsule Commonly known as: IMODIUM Take 2 mg by mouth 4 (four) times daily as needed for diarrhea or loose stools.   magnesium hydroxide 400 MG/5ML suspension Commonly known as: MILK OF MAGNESIA Take 30 mLs by mouth daily as needed for mild constipation.    nitroGLYCERIN 0.4 MG SL tablet Commonly known as: NITROSTAT Place 0.4 mg under the tongue every 5 (five) minutes as needed for chest pain.   nystatin cream Commonly known as: MYCOSTATIN Apply 1 application topically as needed (panniculitis). Apply to pannus   omeprazole 20 MG capsule Commonly known as: PRILOSEC TAKE 1 CAPSULE BY MOUTH EVERY DAY. What changed:  how much to take how to take this when to take this additional instructions   oxybutynin 5 MG tablet Commonly known as: DITROPAN TAKE ONE TABLET BY MOUTH EVERY DAY   polyethylene glycol 17 g packet Commonly known as: MIRALAX / GLYCOLAX Take 17 g by mouth daily.   propranolol 40 MG tablet Commonly known as: INDERAL Take 1 tablet (40 mg total) by mouth daily.   sertraline 100 MG tablet Commonly known as: ZOLOFT TAKE ONE TABLET BY MOUTH EVERY DAY What changed:  how much to take how to take this when to take this additional instructions   tapentadol 50 MG tablet Commonly known as: NUCYNTA Take 50 mg by mouth 3 (three) times daily.   verapamil 240 MG 24 hr capsule Commonly known as: VERELAN PM Take 240 mg by mouth at bedtime.   Vitamin D3 50 MCG (2000 UT) capsule Take 2,000 Units by mouth daily.        Allergies:  Allergies  Allergen Reactions   Statins Other (See Comments)    Very bad muscle aches   Tramadol Itching   Levaquin [Levofloxacin In D5w] Diarrhea and Other (See Comments)    Confusion, hallucinations   Neomycin-Polymyxin-Gramicidin Other (See Comments)   Penicillins     Tolerates cephalosporins Did it involve swelling of the face/tongue/throat, SOB, or low BP? Unknown Did it involve sudden or severe rash/hives, skin peeling, or any reaction on the inside of your mouth or nose? Unknown Did you need to seek medical attention at a hospital or doctor's office? Unknown When did it last happen? Unknown If all above answers are "NO", may proceed with cephalosporin use.   Sulfa Antibiotics     Tetracyclines & Related     Family History: Family History  Problem Relation Age of Onset   Stroke Father    Heart disease Sister    Cancer Brother        colon   Heart disease Brother    Cancer Sister        colon   Heart attack Brother    Heart attack Son    Parkinson's disease Son     Social History:  reports that she has never smoked. She has never used smokeless tobacco. She reports that she does not drink alcohol and does not use drugs.   Physical Exam: BP (!) 177/67   Pulse 65   Ht 5\' 3"  (1.6 m)  Wt 158 lb (71.7 kg)   BMI 27.99 kg/m   Constitutional:  Alert, No acute distress. HEENT: [place] AT, moist mucus membranes.  Trachea midline, no masses. Cardiovascular: No clubbing, cyanosis, or edema. Respiratory: Normal respiratory effort, no increased work of breathing. Skin: No rashes, bruises or suspicious lesions. Neurologic: Grossly intact, no focal deficits, moving all 4 extremities. Psychiatric: Normal mood and affect.  Laboratory Data:  Urinalysis Dipstick trace blood/3+ leukocytes Microscopy 11-30 WBC/3-10 RBC/moderate bacteria  Assessment & Plan:    1. Dysuria Mild dysuria which has been intermittent UA today  2.  Recurrent UTI UA today with pyuria Urine culture ordered  3.  Overactive bladder We discussed the most likely cause of her frequency, urgency and urge incontinence Discontinue oxybutynin Trial Myrbetriq 25 mg daily-samples given   PA follow-up 1 month for symptom recheck   Abbie Sons, [place] [address] [place], [place] [address] [phone]

## 2020-12-03 ENCOUNTER — Encounter: Payer: Self-pay | Admitting: Urology

## 2020-12-31 ENCOUNTER — Ambulatory Visit (INDEPENDENT_AMBULATORY_CARE_PROVIDER_SITE_OTHER): Payer: Medicare Other | Admitting: Physician Assistant

## 2020-12-31 ENCOUNTER — Telehealth: Payer: Self-pay | Admitting: Physician Assistant

## 2020-12-31 ENCOUNTER — Encounter: Payer: Self-pay | Admitting: Physician Assistant

## 2020-12-31 ENCOUNTER — Other Ambulatory Visit: Payer: Self-pay

## 2020-12-31 VITALS — BP 176/65 | HR 62 | Ht 60.0 in | Wt 158.0 lb

## 2020-12-31 DIAGNOSIS — R3129 Other microscopic hematuria: Secondary | ICD-10-CM | POA: Diagnosis not present

## 2020-12-31 DIAGNOSIS — N3281 Overactive bladder: Secondary | ICD-10-CM | POA: Diagnosis not present

## 2020-12-31 DIAGNOSIS — N390 Urinary tract infection, site not specified: Secondary | ICD-10-CM

## 2020-12-31 DIAGNOSIS — R300 Dysuria: Secondary | ICD-10-CM

## 2020-12-31 LAB — URINALYSIS, COMPLETE
Bilirubin, UA: NEGATIVE
Glucose, UA: NEGATIVE
Ketones, UA: NEGATIVE
Nitrite, UA: NEGATIVE
Protein,UA: NEGATIVE
Specific Gravity, UA: 1.025 (ref 1.005–1.030)
Urobilinogen, Ur: 0.2 mg/dL (ref 0.2–1.0)
pH, UA: 5.5 (ref 5.0–7.5)

## 2020-12-31 LAB — MICROSCOPIC EXAMINATION
Bacteria, UA: NONE SEEN
WBC, UA: >30 /hpf — AB (ref 0–5)

## 2020-12-31 LAB — BLADDER SCAN AMB NON-IMAGING: Scan Result: 0 mL

## 2020-12-31 MED ORDER — PREMARIN 0.625 MG/GM VA CREA: TOPICAL_CREAM | VAGINAL | 4 refills | Status: DC

## 2020-12-31 MED ORDER — MIRABEGRON ER 25 MG PO TB24: 25.0000 mg | ORAL_TABLET | Freq: Every day | ORAL | 11 refills | Status: DC

## 2020-12-31 NOTE — Progress Notes (Signed)
12/31/2020 2:15 PM   Clearnce Hasten 1921-10-27 [id number]  CC: Chief Complaint  Patient presents with   Dysuria   Follow-up    HPI: YAN OKRAY is a 87 y.o. female with PMH dysuria, OAB wet previously on oxybutynin 5 mg daily, and recurrent UTI who presents today for symptom recheck on Myrbetriq 25 mg daily.  She is accompanied today by her son, who contributes to HPI.  Today she reports no significant difference in her urgency, frequency, and urge incontinence on Myrbetriq.  She continues to report foot on the floor phenomenon.  Overall, she states she is not having any dysuria or UTI symptoms today.  Her son reports that she was tested for a urinary tract infection by Drs. Making housecalls approximately 2 weeks ago, however these results are not available for review.  It is unclear if she was treated for UTI at that time.  Notably, her urine microscopy in clinic last month was notable for pyuria, microscopic hematuria, and bacteriuria.  Urine culture was planned, but never performed.  She denies a history of urolithiasis.  She has sciatica at baseline.  No abdominal cross-sectional imaging available for review.  In-office UA today positive for trace intact blood and 2+ leukocyte esterase; urine microscopy with >30 WBCs/HPF and 3-10 RBCs/HPF. PVR 49m.  PMH: Past Medical History:  Diagnosis Date   Anxiety    Arthritis    osteo   Asthma    COPD (chronic obstructive pulmonary disease) (HCC)    GERD (gastroesophageal reflux disease)    Heart murmur    Hernia    Hyperlipidemia    Hypertension    [place]    face   Syncope and collapse    Urine incontinence     Surgical History: Past Surgical History:  Procedure Laterality Date   ABDOMINAL HYSTERECTOMY     AMPUTATION TOE Right 06/14/2018   Procedure: AMPUTATION TOE - RIGHT 5TH;  Surgeon: TAlbertine Patricia DPM;  Location: [facility];  Service: Podiatry;  Laterality: Right;   CHOLECYSTECTOMY     HERNIA REPAIR      INTRAMEDULLARY (IM) NAIL INTERTROCHANTERIC Right 06/22/2019   Procedure: INTRAMEDULLARY (IM) NAIL INTERTROCHANTRIC;  Surgeon: XLeandrew Koyanagi MD;  [facility]  Service: Orthopedics;  Laterality: Right;    Home Medications:  Allergies as of 12/31/2020       Reactions   Statins Other (See Comments)   Very bad muscle aches   Tramadol Itching   Levaquin [levofloxacin In D5w] Diarrhea, Other (See Comments)   Confusion, hallucinations   Neomycin-polymyxin-gramicidin Other (See Comments)   Penicillins    Tolerates cephalosporins Did it involve swelling of the face/tongue/throat, SOB, or low BP? Unknown Did it involve sudden or severe rash/hives, skin peeling, or any reaction on the inside of your mouth or nose? Unknown Did you need to seek medical attention at a hospital or doctor's office? Unknown When did it last happen? Unknown If all above answers are "NO", may proceed with cephalosporin use.   Sulfa Antibiotics    Tetracyclines & Related         Medication List        Accurate as of December 31, 2020  2:15 PM. If you have any questions, ask your nurse or doctor.          acetaminophen 500 MG tablet Commonly known as: TYLENOL Take 500 mg by mouth every 6 (six) hours as needed for mild pain or moderate pain.   acetaminophen 325 MG  tablet Commonly known as: TYLENOL Take 650 mg by mouth 2 (two) times daily.   alendronate 70 MG tablet Commonly known as: FOSAMAX Take 70 mg by mouth every Thursday.   alum & mag hydroxide-simeth 200-200-20 MG/5ML suspension Commonly known as: MAALOX/MYLANTA Take 30 mLs by mouth 3 (three) times daily as needed for indigestion or heartburn.   aspirin 325 MG tablet Take 1 tablet (325 mg total) by mouth daily.   Daily Vites tablet Take 1 tablet by mouth daily.   enalapril 20 MG tablet Commonly known as: VASOTEC Take 20 mg by mouth daily.   feeding supplement Liqd Take 237 mLs by mouth 2 (two) times daily between meals.    gabapentin 300 MG capsule Commonly known as: NEURONTIN Take 1 capsule (300 mg total) by mouth at bedtime. What changed: when to take this   guaifenesin 100 MG/5ML syrup Commonly known as: ROBITUSSIN Take 200 mg by mouth every 6 (six) hours as needed for cough.   loperamide 2 MG capsule Commonly known as: IMODIUM Take 2 mg by mouth 4 (four) times daily as needed for diarrhea or loose stools.   magnesium hydroxide 400 MG/5ML suspension Commonly known as: MILK OF MAGNESIA Take 30 mLs by mouth daily as needed for mild constipation.   mirabegron ER 25 MG Tb24 tablet Commonly known as: MYRBETRIQ Take 1 tablet (25 mg total) by mouth daily.   nitroGLYCERIN 0.4 MG SL tablet Commonly known as: NITROSTAT Place 0.4 mg under the tongue every 5 (five) minutes as needed for chest pain.   nystatin cream Commonly known as: MYCOSTATIN Apply 1 application topically as needed (panniculitis). Apply to pannus   omeprazole 20 MG capsule Commonly known as: PRILOSEC TAKE 1 CAPSULE BY MOUTH EVERY DAY. What changed:  how much to take how to take this when to take this additional instructions   polyethylene glycol 17 g packet Commonly known as: MIRALAX / GLYCOLAX Take 17 g by mouth daily.   propranolol 40 MG tablet Commonly known as: INDERAL Take 1 tablet (40 mg total) by mouth daily.   sertraline 100 MG tablet Commonly known as: ZOLOFT TAKE ONE TABLET BY MOUTH EVERY DAY What changed:  how much to take how to take this when to take this additional instructions   tapentadol 50 MG tablet Commonly known as: NUCYNTA Take 50 mg by mouth 3 (three) times daily.   verapamil 240 MG 24 hr capsule Commonly known as: VERELAN PM Take 240 mg by mouth at bedtime.   Vitamin D3 50 MCG (2000 UT) capsule Take 2,000 Units by mouth daily.        Allergies:  Allergies  Allergen Reactions   Statins Other (See Comments)    Very bad muscle aches   Tramadol Itching   Levaquin [Levofloxacin In  D5w] Diarrhea and Other (See Comments)    Confusion, hallucinations   Neomycin-Polymyxin-Gramicidin Other (See Comments)   Penicillins     Tolerates cephalosporins Did it involve swelling of the face/tongue/throat, SOB, or low BP? Unknown Did it involve sudden or severe rash/hives, skin peeling, or any reaction on the inside of your mouth or nose? Unknown Did you need to seek medical attention at a hospital or doctor's office? Unknown When did it last happen? Unknown If all above answers are "NO", may proceed with cephalosporin use.   Sulfa Antibiotics    Tetracyclines & Related     Family History: Family History  Problem Relation Age of Onset   Stroke Father    Heart  disease Sister    Cancer Brother        colon   Heart disease Brother    Cancer Sister        colon   Heart attack Brother    Heart attack Son    Parkinson's disease Son     Social History:   reports that she has never smoked. She has never used smokeless tobacco. She reports that she does not drink alcohol and does not use drugs.  Physical Exam: BP (!) 176/65   Pulse 62   Ht 5' (1.524 m)   Wt 158 lb (71.7 kg)   BMI 30.86 kg/m   Constitutional:  Alert and oriented, no acute distress, nontoxic appearing HEENT: [place], AT Cardiovascular: No clubbing, cyanosis, or edema Respiratory: Normal respiratory effort, no increased work of breathing Skin: No rashes, bruises or suspicious lesions Neurologic: Grossly intact, no focal deficits, moving all 4 extremities Psychiatric: Normal mood and affect  Laboratory Data: Results for orders placed or performed in visit on 12/31/20  Microscopic Examination   Urine  Result Value Ref Range   WBC, UA >30 (A) 0 - 5 /hpf   RBC 3-10 (A) 0 - 2 /hpf   Epithelial Cells (non renal) 0-10 0 - 10 /hpf   Bacteria, UA None seen None seen/Few  Urinalysis, Complete  Result Value Ref Range   Specific Gravity, UA 1.025 1.005 - 1.030   pH, UA 5.5 5.0 - 7.5   Color, UA Yellow Yellow    Appearance Ur Cloudy (A) Clear   Leukocytes,UA 2+ (A) Negative   Protein,UA Negative Negative/Trace   Glucose, UA Negative Negative   Ketones, UA Negative Negative   RBC, UA Trace (A) Negative   Bilirubin, UA Negative Negative   Urobilinogen, Ur 0.2 0.2 - 1.0 mg/dL   Nitrite, UA Negative Negative   Microscopic Examination See below:   BLADDER SCAN AMB NON-IMAGING  Result Value Ref Range   Scan Result 0 mL   Assessment & Plan:   1. Dysuria Not present today, though her UA remains notable for pyuria and microscopic hematuria, see #4 below.  Unclear if she was treated for acute cystitis since her last clinic visit 1 month ago. - Urinalysis, Complete  2. Overactive bladder She reports no symptomatic improvement on Myrbetriq, but is emptying well.  Given no symptomatic worsening after transitioning over from oxybutynin, will plan to continue Myrbetriq as this is a safer agent for her given her advanced age. - BLADDER SCAN AMB NON-IMAGING - mirabegron ER (MYRBETRIQ) 25 MG TB24 tablet; Take 1 tablet (25 mg total) by mouth daily.  Dispense: 30 tablet; Refill: 11  3. Recurrent UTI We discussed starting topical vaginal estrogen cream for recurrent UTI prevention.  Patient and son are in agreement with this plan. - conjugated estrogens (PREMARIN) vaginal cream; Apply one pea-sized amount around the opening of the urethra daily for 2 weeks, then 3 times weekly moving forward.  Dispense: 30 g; Refill: 4  4. Microscopic hematuria Noted again today in the absence of bacteriuria.  Will send for culture for further evaluation and treat as indicated with plans for repeat CATH UA to prove resolution.  If culture is negative, we discussed proceeding with hematuria work-up as below.  Patient and son are both interested in pursuing this despite her age.  I had a lengthy conversation today with the patient regarding the etiology of blood in the urine.  I explained that blood in the urine can be caused by  a myriad of factors, including but not limited to infection, stones, cysts, anticoagulation, and urinary tract malignancies.  I explained that the recommended work-up for blood in the urine is twofold and includes a CT urogram for evaluation of the upper urinary tract including kidneys and ureters as well as a cystoscopy for evaluation of the urethra and bladder.  I explained that these two studies complement one another in reviewing the entire urinary tract for possible causes of bleeding.   - CULTURE, URINE COMPREHENSIVE  Return for Will call with results.  Debroah Loop, PA-C  [facility] [address], [place], [place] [address] [phone]

## 2020-12-31 NOTE — Telephone Encounter (Signed)
[facility] called to let us know Pt's prescriptions need to be faxed to [facility] FAX# [phone]

## 2020-12-31 NOTE — Patient Instructions (Signed)
You should no longer be taking oxybutynin. If you are still taking this medication, please stop it.  Continue Myrbetriq. I've sent a 1-year prescription to your pharmacy.  I would like you to start a topical vaginal estrogen cream for UTI prevention. I have send in a prescription for this to your pharmacy. Please place a pea-sized amount around the opening of the urethra every day for 2 weeks, then only 3 times per week moving forward.  Your urine today continues to have blood in it. I am going to send it for culture to see if you have an infection. I will call Dan with your results. If your culture is negative for infection or if you continue to have blood in your urine after taking appropriate antibiotics, then I recommend that we get a CT scan and a cystoscopy to look throughout your urinary tract for possible sources of bleeding.

## 2021-01-04 LAB — CULTURE, URINE COMPREHENSIVE

## 2021-01-04 NOTE — Telephone Encounter (Addendum)
Spoke with son, informed and reviewed in detail. Offered to schedule nurse visit for cath UA, declined, will call back to r/s.    ----- Message from Debroah Loop, PA-C sent at 01/04/2021  4:32 PM EDT ----- Her urine culture is negative for infection. I would recommend bringing her in for a cath UA to rule out sample contamination as the source of her recent pyuria and MH. If these persist on cath sample, ok to proceed with hematuria workup as discussed in clinic. ----- Message ----- From: Interface, [facility] Lab Results In Sent: 12/31/2020   4:43 PM EDT To: Debroah Loop, PA-C

## 2021-01-12 MED ORDER — ESTRADIOL 0.1 MG/GM VA CREA: TOPICAL_CREAM | VAGINAL | 12 refills | Status: DC

## 2021-01-12 MED ORDER — MIRABEGRON ER 25 MG PO TB24: 25.0000 mg | ORAL_TABLET | Freq: Every day | ORAL | 11 refills | Status: DC

## 2021-01-12 NOTE — Addendum Note (Signed)
Addended by: Alvera Novel on: 01/12/2021 10:41 AM   Modules accepted: Orders

## 2021-01-12 NOTE — Telephone Encounter (Signed)
Alwyn Ren from [facility] called asking to refax medication to a different pharmacy. Fax # [phone]

## 2021-11-01 ENCOUNTER — Emergency Department: Payer: Medicare Other

## 2021-11-01 ENCOUNTER — Inpatient Hospital Stay
Admission: EM | Admit: 2021-11-01 | Discharge: 2021-11-05 | DRG: 871 | Disposition: A | Discharge status: Home / Self Care | Payer: Medicare Other | Source: Ambulatory Visit | Attending: Internal Medicine | Admitting: Internal Medicine

## 2021-11-01 ENCOUNTER — Encounter: Payer: Self-pay | Admitting: Internal Medicine

## 2021-11-01 ENCOUNTER — Other Ambulatory Visit: Payer: Self-pay

## 2021-11-01 DIAGNOSIS — R778 Other specified abnormalities of plasma proteins: Secondary | ICD-10-CM | POA: Diagnosis present

## 2021-11-01 DIAGNOSIS — Z85820 Personal history of malignant melanoma of skin: Secondary | ICD-10-CM

## 2021-11-01 DIAGNOSIS — I130 Hypertensive heart and chronic kidney disease with heart failure and stage 1 through stage 4 chronic kidney disease, or unspecified chronic kidney disease: Secondary | ICD-10-CM | POA: Diagnosis present

## 2021-11-01 DIAGNOSIS — I5033 Acute on chronic diastolic (congestive) heart failure: Secondary | ICD-10-CM | POA: Diagnosis present

## 2021-11-01 DIAGNOSIS — G629 Polyneuropathy, unspecified: Secondary | ICD-10-CM | POA: Diagnosis present

## 2021-11-01 DIAGNOSIS — L03115 Cellulitis of right lower limb: Secondary | ICD-10-CM | POA: Diagnosis present

## 2021-11-01 DIAGNOSIS — A419 Sepsis, unspecified organism: Secondary | ICD-10-CM | POA: Diagnosis present

## 2021-11-01 DIAGNOSIS — Z7983 Long term (current) use of bisphosphonates: Secondary | ICD-10-CM

## 2021-11-01 DIAGNOSIS — N3001 Acute cystitis with hematuria: Secondary | ICD-10-CM | POA: Diagnosis present

## 2021-11-01 DIAGNOSIS — B961 Klebsiella pneumoniae [K. pneumoniae] as the cause of diseases classified elsewhere: Secondary | ICD-10-CM | POA: Diagnosis present

## 2021-11-01 DIAGNOSIS — Z881 Allergy status to other antibiotic agents status: Secondary | ICD-10-CM

## 2021-11-01 DIAGNOSIS — Z9049 Acquired absence of other specified parts of digestive tract: Secondary | ICD-10-CM

## 2021-11-01 DIAGNOSIS — I739 Peripheral vascular disease, unspecified: Secondary | ICD-10-CM | POA: Diagnosis present

## 2021-11-01 DIAGNOSIS — E785 Hyperlipidemia, unspecified: Secondary | ICD-10-CM | POA: Diagnosis present

## 2021-11-01 DIAGNOSIS — Z66 Do not resuscitate: Secondary | ICD-10-CM | POA: Diagnosis present

## 2021-11-01 DIAGNOSIS — Z8249 Family history of ischemic heart disease and other diseases of the circulatory system: Secondary | ICD-10-CM

## 2021-11-01 DIAGNOSIS — J449 Chronic obstructive pulmonary disease, unspecified: Secondary | ICD-10-CM | POA: Diagnosis present

## 2021-11-01 DIAGNOSIS — K219 Gastro-esophageal reflux disease without esophagitis: Secondary | ICD-10-CM | POA: Diagnosis present

## 2021-11-01 DIAGNOSIS — Z885 Allergy status to narcotic agent status: Secondary | ICD-10-CM | POA: Diagnosis not present

## 2021-11-01 DIAGNOSIS — Z880 Allergy status to penicillin: Secondary | ICD-10-CM

## 2021-11-01 DIAGNOSIS — E669 Obesity, unspecified: Secondary | ICD-10-CM | POA: Diagnosis present

## 2021-11-01 DIAGNOSIS — Z79899 Other long term (current) drug therapy: Secondary | ICD-10-CM

## 2021-11-01 DIAGNOSIS — Z888 Allergy status to other drugs, medicaments and biological substances status: Secondary | ICD-10-CM | POA: Diagnosis not present

## 2021-11-01 DIAGNOSIS — R55 Syncope and collapse: Secondary | ICD-10-CM | POA: Diagnosis present

## 2021-11-01 DIAGNOSIS — Z6830 Body mass index (BMI) 30.0-30.9, adult: Secondary | ICD-10-CM | POA: Diagnosis not present

## 2021-11-01 DIAGNOSIS — L89151 Pressure ulcer of sacral region, stage 1: Secondary | ICD-10-CM | POA: Diagnosis present

## 2021-11-01 DIAGNOSIS — Z820 Family history of epilepsy and other diseases of the nervous system: Secondary | ICD-10-CM

## 2021-11-01 DIAGNOSIS — R739 Hyperglycemia, unspecified: Secondary | ICD-10-CM | POA: Diagnosis present

## 2021-11-01 DIAGNOSIS — E876 Hypokalemia: Secondary | ICD-10-CM | POA: Diagnosis present

## 2021-11-01 DIAGNOSIS — Z90710 Acquired absence of both cervix and uterus: Secondary | ICD-10-CM

## 2021-11-01 DIAGNOSIS — M1990 Unspecified osteoarthritis, unspecified site: Secondary | ICD-10-CM | POA: Diagnosis present

## 2021-11-01 DIAGNOSIS — F419 Anxiety disorder, unspecified: Secondary | ICD-10-CM | POA: Diagnosis present

## 2021-11-01 DIAGNOSIS — L8990 Pressure ulcer of unspecified site, unspecified stage: Secondary | ICD-10-CM | POA: Diagnosis present

## 2021-11-01 DIAGNOSIS — N1831 Chronic kidney disease, stage 3a: Secondary | ICD-10-CM | POA: Diagnosis present

## 2021-11-01 DIAGNOSIS — I5032 Chronic diastolic (congestive) heart failure: Secondary | ICD-10-CM | POA: Diagnosis present

## 2021-11-01 DIAGNOSIS — H9190 Unspecified hearing loss, unspecified ear: Secondary | ICD-10-CM | POA: Diagnosis present

## 2021-11-01 DIAGNOSIS — Z7982 Long term (current) use of aspirin: Secondary | ICD-10-CM

## 2021-11-01 DIAGNOSIS — R6521 Severe sepsis with septic shock: Secondary | ICD-10-CM | POA: Diagnosis present

## 2021-11-01 DIAGNOSIS — R7989 Other specified abnormal findings of blood chemistry: Secondary | ICD-10-CM | POA: Diagnosis present

## 2021-11-01 DIAGNOSIS — N183 Chronic kidney disease, stage 3 unspecified: Secondary | ICD-10-CM | POA: Diagnosis present

## 2021-11-01 DIAGNOSIS — W208XXA Other cause of strike by thrown, projected or falling object, initial encounter: Secondary | ICD-10-CM | POA: Diagnosis present

## 2021-11-01 DIAGNOSIS — Z89421 Acquired absence of other right toe(s): Secondary | ICD-10-CM

## 2021-11-01 LAB — CBC
HCT: 34.2 % — ABNORMAL LOW (ref 36.0–46.0)
Hemoglobin: 10.9 g/dL — ABNORMAL LOW (ref 12.0–15.0)
MCH: 30.1 pg (ref 26.0–34.0)
MCHC: 31.9 g/dL (ref 30.0–36.0)
MCV: 94.5 fL (ref 80.0–100.0)
Platelets: 166 10*3/uL (ref 150–400)
RBC: 3.62 MIL/uL — ABNORMAL LOW (ref 3.87–5.11)
RDW: 15.3 % (ref 11.5–15.5)
WBC: 23.1 10*3/uL — ABNORMAL HIGH (ref 4.0–10.5)
nRBC: 0 % (ref 0.0–0.2)

## 2021-11-01 LAB — URINALYSIS, ROUTINE W REFLEX MICROSCOPIC
Bilirubin Urine: NEGATIVE
Glucose, UA: NEGATIVE mg/dL
Ketones, ur: NEGATIVE mg/dL
Nitrite: NEGATIVE
Protein, ur: 30 mg/dL — AB
Specific Gravity, Urine: 1.019 (ref 1.005–1.030)
pH: 5 (ref 5.0–8.0)

## 2021-11-01 LAB — BASIC METABOLIC PANEL
Anion gap: 6 (ref 5–15)
BUN: 30 mg/dL — ABNORMAL HIGH (ref 8–23)
CO2: 27 mmol/L (ref 22–32)
Calcium: 8.5 mg/dL — ABNORMAL LOW (ref 8.9–10.3)
Chloride: 103 mmol/L (ref 98–111)
Creatinine, Ser: 1.16 mg/dL — ABNORMAL HIGH (ref 0.44–1.00)
GFR, Estimated: 43 mL/min — ABNORMAL LOW (ref >60)
Glucose, Bld: 134 mg/dL — ABNORMAL HIGH (ref 70–99)
Potassium: 3.5 mmol/L (ref 3.5–5.1)
Sodium: 136 mmol/L (ref 135–145)

## 2021-11-01 LAB — PROCALCITONIN: Procalcitonin: 4.19 ng/mL

## 2021-11-01 LAB — MRSA NEXT GEN BY PCR, NASAL: MRSA by PCR Next Gen: NOT DETECTED

## 2021-11-01 LAB — PHOSPHORUS: Phosphorus: 3.8 mg/dL (ref 2.5–4.6)

## 2021-11-01 LAB — TROPONIN I (HIGH SENSITIVITY)
Troponin I (High Sensitivity): 1119 ng/L (ref <18)
Troponin I (High Sensitivity): 824 ng/L (ref <18)

## 2021-11-01 LAB — MAGNESIUM: Magnesium: 1.7 mg/dL (ref 1.7–2.4)

## 2021-11-01 LAB — GLUCOSE, CAPILLARY: Glucose-Capillary: 169 mg/dL — ABNORMAL HIGH (ref 70–99)

## 2021-11-01 LAB — LACTIC ACID, PLASMA: Lactic Acid, Venous: 1.1 mmol/L (ref 0.5–1.9)

## 2021-11-01 MED ORDER — VANCOMYCIN HCL 1500 MG/300ML IV SOLN
1500.0000 mg | Freq: Once | INTRAVENOUS | Status: AC
  Administered 2021-11-01: 1500 mg via INTRAVENOUS
  Filled 2021-11-01 (×2): qty 300

## 2021-11-01 MED ORDER — DOCUSATE SODIUM 100 MG PO CAPS: 100.0000 mg | ORAL_CAPSULE | Freq: Two times a day (BID) | ORAL | Status: DC | PRN

## 2021-11-01 MED ORDER — POLYETHYLENE GLYCOL 3350 17 G PO PACK
17.0000 g | PACK | Freq: Every day | ORAL | Status: DC | PRN
  Administered 2021-11-02 – 2021-11-03 (×2): 17 g via ORAL
  Filled 2021-11-01 (×2): qty 1

## 2021-11-01 MED ORDER — CHLORHEXIDINE GLUCONATE CLOTH 2 % EX PADS
6.0000 | MEDICATED_PAD | Freq: Every day | CUTANEOUS | Status: DC
Start: 2021-11-02 — End: 2021-11-03
  Administered 2021-11-01 – 2021-11-02 (×2): 6 via TOPICAL

## 2021-11-01 MED ORDER — LACTATED RINGERS IV BOLUS
250.0000 mL | Freq: Once | INTRAVENOUS | Status: AC
  Administered 2021-11-01: 250 mL via INTRAVENOUS

## 2021-11-01 MED ORDER — SERTRALINE HCL 50 MG PO TABS
100.0000 mg | ORAL_TABLET | Freq: Every day | ORAL | Status: DC
  Administered 2021-11-02 – 2021-11-05 (×4): 100 mg via ORAL
  Filled 2021-11-01 (×4): qty 2

## 2021-11-01 MED ORDER — ACETAMINOPHEN 325 MG PO TABS
650.0000 mg | ORAL_TABLET | ORAL | Status: DC | PRN
  Administered 2021-11-02 – 2021-11-04 (×3): 650 mg via ORAL
  Filled 2021-11-01 (×3): qty 2

## 2021-11-01 MED ORDER — VANCOMYCIN HCL 500 MG/100ML IV SOLN: 500.0000 mg | INTRAVENOUS | Status: DC

## 2021-11-01 MED ORDER — SODIUM CHLORIDE 0.9 % IV BOLUS
2000.0000 mL | Freq: Once | INTRAVENOUS | Status: AC
  Administered 2021-11-01: 2000 mL via INTRAVENOUS

## 2021-11-01 MED ORDER — SODIUM CHLORIDE 0.9 % IV SOLN
2.0000 g | INTRAVENOUS | Status: DC
  Administered 2021-11-01: 2 g via INTRAVENOUS
  Filled 2021-11-01: qty 12.5

## 2021-11-01 MED ORDER — ENOXAPARIN SODIUM 30 MG/0.3ML IJ SOSY
30.0000 mg | PREFILLED_SYRINGE | INTRAMUSCULAR | Status: DC
  Administered 2021-11-01 – 2021-11-02 (×2): 30 mg via SUBCUTANEOUS
  Filled 2021-11-01 (×2): qty 0.3

## 2021-11-01 MED ORDER — IPRATROPIUM-ALBUTEROL 0.5-2.5 (3) MG/3ML IN SOLN: 3.0000 mL | RESPIRATORY_TRACT | Status: DC | PRN

## 2021-11-01 MED ORDER — NOREPINEPHRINE 4 MG/250ML-% IV SOLN
0.0000 ug/min | INTRAVENOUS | Status: DC
  Administered 2021-11-01: 2 ug/min via INTRAVENOUS
  Filled 2021-11-01: qty 250

## 2021-11-01 MED ORDER — SODIUM CHLORIDE 0.9 % IV SOLN
2.0000 g | INTRAVENOUS | Status: DC
  Administered 2021-11-01: 2 g via INTRAVENOUS
  Filled 2021-11-01: qty 20

## 2021-11-01 MED ORDER — ONDANSETRON HCL 4 MG/2ML IJ SOLN: 4.0000 mg | Freq: Four times a day (QID) | INTRAMUSCULAR | Status: DC | PRN

## 2021-11-01 NOTE — Consult Note (Signed)
CODE SEPSIS - PHARMACY COMMUNICATION  **Broad Spectrum Antibiotics should be administered within 1 hour of Sepsis diagnosis**  Time Code Sepsis Called/Page Received: 1111  Antibiotics Ordered: Rocephin  Time of 1st antibiotic administration: 0321  Additional action taken by pharmacy: none  If necessary, Name of Provider/Nurse Contacted: n/a    Pearla Dubonnet ,PharmD Clinical Pharmacist  11/01/2021  11:35 AM

## 2021-11-01 NOTE — Consult Note (Signed)
Pharmacy Antibiotic Note  Cindy Graham is a 88 y.o. female admitted on 11/01/2021 with sepsis.  Pharmacy has been consulted for vancomycin and cefepime dosing.  Plan: Vancomycin 1500 mg IV loading dose, followed by 500 mg IV q24 hours Goal AUC 400-550  Est AUC: 471.5 Est Cmax: 24.9 Est Cmin: 15.2 Calculated with SCr 1.16  Cefepime 2 g IV q24h per indication and renal function   Monitor clinical picture, renal function, and vancomycin levels at steady state F/U C&S, abx deescalation / LOT  Height: 5' (152.4 cm) Weight: 71.7 kg (158 lb 1.1 oz) IBW/kg (Calculated) : 45.5  Temp (24hrs), Avg:98.6 F (37 C), Min:98.6 F (37 C), Max:98.6 F (37 C)  Recent Labs  Lab 11/01/21 1004  WBC 23.1*  CREATININE 1.16*  LATICACIDVEN 1.1    Estimated Creatinine Clearance: 23.9 mL/min (A) (by C-G formula based on SCr of 1.16 mg/dL (H)).    Allergies  Allergen Reactions   Statins Other (See Comments)    Very bad muscle aches   Tramadol Itching   Levaquin [Levofloxacin In D5w] Diarrhea and Other (See Comments)    Confusion, hallucinations   Neomycin-Polymyxin-Gramicidin Other (See Comments)   Penicillins     Tolerates cephalosporins Did it involve swelling of the face/tongue/throat, SOB, or low BP? Unknown Did it involve sudden or severe rash/hives, skin peeling, or any reaction on the inside of your mouth or nose? Unknown Did you need to seek medical attention at a hospital or doctor's office? Unknown When did it last happen? Unknown If all above answers are "NO", may proceed with cephalosporin use.   Sulfa Antibiotics    Tetracyclines & Related     Antimicrobials this admission: 6/12 ceftriaxone x 1 6/12 cefepime >>  6/12 vancomycin >>   Dose adjustments this admission:   Microbiology results: 6/12 BCx: pending 6/12 UCx: pending  6/12 MRSA PCR: pending  Thank you for allowing pharmacy to be a part of this patient's care.  Darnelle Bos, PharmD 11/01/2021 4:03 PM

## 2021-11-01 NOTE — Progress Notes (Addendum)
1640 Report received from [facility]- ED. 1725 Patient admitted from the ED via stretcher. Alert and oriented. Right lower leg  and right foot cellulitis noted. Cellulitis painful to the touch. MASD under both breast. Discolored ares on posterior upper thighs- but blanchable.

## 2021-11-01 NOTE — Progress Notes (Signed)
Elink following code sepsis °

## 2021-11-01 NOTE — H&P (Signed)
NAME:  Cindy Graham, MRN:  [id number], DOB:  20-Oct-1921, LOS: 0 ADMISSION DATE:  11/01/2021, CONSULTATION DATE:  11/01/21 REFERRING MD: Dr. Starleen Blue, CHIEF COMPLAINT: Right lower extremity pain and redness  Brief Pt Description / Synopsis:  88 year old female admitted with severe sepsis with septic shock in the setting of right lower extremity cellulitis and UTI.  History of Present Illness:  Cindy Graham is a 88 year old female with a past medical history significant for COPD, HFpEF, hypertension, hyperlipidemia, right lower extremity cellulitis who presented to [facility] ED on 11/01/2021 due to fever and increasing pain, redness, and swelling of the right lower extremity.  The patient reports she dropped a book on her right lower extremity last Thursday, and has since developed increasing swelling. redness, and pain to that extremity.  Staff at [employer] also reported fever.  She denied chest pain, shortness of breath, abdominal pain, fever, chills.  ED Course: Initial Vital Signs: Temperature 98.6 F orally, pulse 77, respiratory rate 16, blood pressure 101/49, SPO2 91% on room air Significant Labs: Glucose 134, BUN 30, creatinine 1.16, lactic acid 1.1, WBC 23.1, hemoglobin 10.9, hematocrit 34.2 Urinalysis concerning for UTI Imaging Chest X-ray>>FINDINGS: Stable cardiomediastinal silhouette with normal heart size. No pneumothorax. Stable mild eventration of the right hemidiaphragm. Probable small right pleural effusion, similar. No left pleural effusion. No pulmonary edema. Stable mild right basilar scarring versus atelectasis. CT abdomen pelvis>>IMPRESSION: 1. No acute or focal abnormality to explain the patient's symptoms. 2. Redundant sigmoid colon extending into the upper abdomen without evidence for torsion or obstruction. 3. Small bilateral pleural effusions with right greater than left dependent airspace disease. This may represent atelectasis. Infection is considered less  likely. 4. Cardiomegaly with coronary artery disease. 5. Aortic Atherosclerosis (ICD10-I70.0). Venous ultrasound right lower extremity>>IMPRESSION: Directed duplex right lower extremity negative for DVT. Medications Administered: IV ceftriaxone and vancomycin, 2.25 L of normal saline boluses  She met sepsis criteria, therefore cultures were drawn and she received IV fluids and broad-spectrum antibiotics.  Despite fluid resuscitation she became hypotensive with blood pressure 84/48 requiring initiation of low-dose Levophed.  PCCM is asked to admit to ICU for further work-up and treatment of severe sepsis with septic shock in the setting of right lower extremity cellulitis and UTI.  Pertinent  Medical History   Past Medical History:  Diagnosis Date   Anxiety    Arthritis    osteo   Asthma    COPD (chronic obstructive pulmonary disease) (HCC)    GERD (gastroesophageal reflux disease)    Heart murmur    Hernia    Hyperlipidemia    Hypertension    Melanoma ([place])    face   Syncope and collapse    Urine incontinence      Micro Data:  6/12: Blood culture x2>> 6/12: Urine>>  Antimicrobials:  Ceftriaxone 6/12 x1 dose  Cefepime 6/12>> Vancomycin 6/12>>  Significant Hospital Events: Including procedures, antibiotic start and stop dates in addition to other pertinent events   6/12: Patient presents to ED, hypotensive despite IV fluid resuscitation requiring vasopressors.  PCCM asked to admit  Interim History / Subjective:  -Patient seen and evaluated in the ED -Was hypotensive following fluid resuscitation, currently on 2 mics of Levophed with systolic blood pressure in the 120s after repositioning blood pressure cuff -Patient is asymptomatic, awake, alert, noted to have urine output in the pure wick -Afebrile, on room air -Found to have sepsis due to right lower extremity cellulitis and UTI -Patient's son at bedside, updated, both  patient and son confirmed DNR/DNI status, would  like to continue to treat with IV fluids antibiotics and vasopressors  Objective   Blood pressure (!) 107/51, pulse 70, temperature 98.6 F (37 C), temperature source Oral, resp. rate (!) 23, height 5' (1.524 m), weight 71.7 kg, SpO2 97 %.       No intake or output data in the 24 hours ending 11/01/21 1548 Filed Weights   11/01/21 0959  Weight: 71.7 kg    Examination: General: Acute on chronically ill-appearing female, sitting in bed, on room air, no acute distress HENT: Atraumatic, normocephalic, neck supple, no JVD Lungs: Clear breath sounds bilaterally, no wheezing or rales noted, even, nonlabored Cardiovascular: Regular rate and rhythm, S1-S2, positive murmur Abdomen: Soft, nontender, nondistended, no guarding rebound tenderness, bowel sounds positive x4 Extremities: Generalized weakness, right lower extremity erythematous, warm and tender but compartments soft and no crepitus present, small open wound to the medial aspect with no purulence (see image below), chronic trophic changes to left lower extremity Neuro: Awake and alert, oriented x3, moves all extremities to command, no focal deficits, speech clear GU: Pure wick external female catheter in place draining dark yellow urine     Resolved Hospital Problem list     Assessment & Plan:   Septic Shock Chronic HFpEF, not acutely exacerbated PMHx: Hypertension, syncope, hyperlipidemia -Continuous cardiac monitoring -Maintain MAP >65 -IV fluids (received 30 cc/kg fluid resuscitation in ED) -Vasopressors as needed to maintain MAP goal -Trend lactic acid until normalized -Trend HS Troponin until peaked -Consider repeat Echocardiogram (Echo 03/17/20: LVEF 60 to 31%, grade 2 diastolic dysfunction, normal RV systolic function, mild mitral valve regurg) -Hold home antihypertensives   Severe Sepsis due to RLE Cellulitis & UTI -Monitor fever curve -Trend WBC's & Procalcitonin -Follow cultures as above -Continue empiric  Cefepime & Vancomycin pending cultures & sensitivities  COPD without acute exacerbation -Supplemental O2 as needed to maintain O2 sats 88 to 92% -Follow intermittent Chest X-ray & ABG as needed -Bronchodilators Prn -Pulmonary toilet as able  Hyperglycemia -CBG's q4h; Target range of 140 to 180 -SSI -Follow ICU Hypo/Hyperglycemia protocol -Check Hgb A1c   Best Practice (right click and "Reselect all SmartList Selections" daily)   Diet/type: Regular consistency (see orders) DVT prophylaxis: LMWH GI prophylaxis: N/A Lines: N/A Foley:  N/A Code Status:  DNR Last date of multidisciplinary goals of care discussion [N/A]  Patient's son updated at bedside in the ED 11/01/2020.  CODE STATUS addressed with patient and son at bedside, they both confirmed that patient is DNR/DNI.  Labs   CBC: Recent Labs  Lab 11/01/21 1004  WBC 23.1*  HGB 10.9*  HCT 34.2*  MCV 94.5  PLT 517    Basic Metabolic Panel: Recent Labs  Lab 11/01/21 1004  NA 136  K 3.5  CL 103  CO2 27  GLUCOSE 134*  BUN 30*  CREATININE 1.16*  CALCIUM 8.5*   GFR: Estimated Creatinine Clearance: 23.9 mL/min (A) (by C-G formula based on SCr of 1.16 mg/dL (H)). Recent Labs  Lab 11/01/21 1004  WBC 23.1*  LATICACIDVEN 1.1    Liver Function Tests: No results for input(s): "AST", "ALT", "ALKPHOS", "BILITOT", "PROT", "ALBUMIN" in the last 168 hours. No results for input(s): "LIPASE", "AMYLASE" in the last 168 hours. No results for input(s): "AMMONIA" in the last 168 hours.  ABG No results found for: "PHART", "PCO2ART", "PO2ART", "HCO3", "TCO2", "ACIDBASEDEF", "O2SAT"   Coagulation Profile: No results for input(s): "INR", "PROTIME" in the last 168 hours.  Cardiac  Enzymes: No results for input(s): "CKTOTAL", "CKMB", "CKMBINDEX", "TROPONINI" in the last 168 hours.  HbA1C: Hemoglobin A1C  Date/Time Value Ref Range Status  03/14/2013 04:44 PM 6.0 4.2 - 6.3 % Final    Comment:    The American Diabetes  Association recommends that a primary goal of therapy should be <7% and that physicians should reevaluate the treatment regimen in patients with HbA1c values consistently >8%.    Hgb A1c MFr Bld  Date/Time Value Ref Range Status  03/17/2020 07:04 AM 5.5 4.8 - 5.6 % Final    Comment:    (NOTE)         Prediabetes: 5.7 - 6.4         Diabetes: >6.4         Glycemic control for adults with diabetes: <7.0   02/23/2019 06:18 AM 5.5 4.8 - 5.6 % Final    Comment:    (NOTE) Pre diabetes:          5.7%-6.4% Diabetes:              >6.4% Glycemic control for   <7.0% adults with diabetes     CBG: No results for input(s): "GLUCAP" in the last 168 hours.  Review of Systems:   Positives in BOLD: Gen: Denies fever, chills, weight change, fatigue, night sweats HEENT: Denies blurred vision, double vision, hearing loss, tinnitus, sinus congestion, rhinorrhea, sore throat, neck stiffness, dysphagia PULM: Denies shortness of breath, cough, sputum production, hemoptysis, wheezing CV: Denies chest pain, edema, orthopnea, paroxysmal nocturnal dyspnea, palpitations, RLE erythema and pain GI: Denies abdominal pain, nausea, vomiting, diarrhea, hematochezia, melena, constipation, change in bowel habits GU: Denies dysuria, hematuria, polyuria, oliguria, urethral discharge Endocrine: Denies hot or cold intolerance, polyuria, polyphagia or appetite change Derm: Denies rash, dry skin, scaling or peeling skin change Heme: Denies easy bruising, bleeding, bleeding gums Neuro: Denies headache, numbness, weakness, slurred speech, loss of memory or consciousness   Past Medical History:  She,  has a past medical history of Anxiety, Arthritis, Asthma, COPD (chronic obstructive pulmonary disease) ([place]), GERD (gastroesophageal reflux disease), Heart murmur, Hernia, Hyperlipidemia, Hypertension, Melanoma ([place]), Syncope and collapse, and Urine incontinence.   Surgical History:   Past Surgical History:  Procedure  Laterality Date   ABDOMINAL HYSTERECTOMY     AMPUTATION TOE Right 06/14/2018   Procedure: AMPUTATION TOE - RIGHT 5TH;  Surgeon: Albertine Patricia, DPM;  Location: [facility];  Service: Podiatry;  Laterality: Right;   CHOLECYSTECTOMY     HERNIA REPAIR     INTRAMEDULLARY (IM) NAIL INTERTROCHANTERIC Right 06/22/2019   Procedure: INTRAMEDULLARY (IM) NAIL INTERTROCHANTRIC;  Surgeon: Leandrew Koyanagi, MD;  Location: [place];  Service: Orthopedics;  Laterality: Right;     Social History:   reports that she has never smoked. She has never used smokeless tobacco. She reports that she does not drink alcohol and does not use drugs.   Family History:  Her family history includes Cancer in her brother and sister; Heart attack in her brother and son; Heart disease in her brother and sister; Parkinson's disease in her son; Stroke in her father.   Allergies Allergies  Allergen Reactions   Statins Other (See Comments)    Very bad muscle aches   Tramadol Itching   Levaquin [Levofloxacin In D5w] Diarrhea and Other (See Comments)    Confusion, hallucinations   Neomycin-Polymyxin-Gramicidin Other (See Comments)   Penicillins     Tolerates cephalosporins Did it involve swelling of the face/tongue/throat, SOB, or low BP? Unknown Did  it involve sudden or severe rash/hives, skin peeling, or any reaction on the inside of your mouth or nose? Unknown Did you need to seek medical attention at a hospital or doctor's office? Unknown When did it last happen? Unknown If all above answers are "NO", may proceed with cephalosporin use.   Sulfa Antibiotics    Tetracyclines & Related      Home Medications  Prior to Admission medications   Medication Sig Start Date End Date Taking? Authorizing Provider  acetaminophen (TYLENOL) 325 MG tablet Take 650 mg by mouth 2 (two) times daily.   Yes [provider]  acetaminophen (TYLENOL) 500 MG tablet Take 500 mg by mouth every 6 (six) hours as needed for mild pain or  moderate pain.   Yes [provider]  alendronate (FOSAMAX) 70 MG tablet Take 70 mg by mouth every 7 (seven) days. monday   Yes [provider]  alum & mag hydroxide-simeth (MAALOX/MYLANTA) 200-200-20 MG/5ML suspension Take 30 mLs by mouth 3 (three) times daily as needed for indigestion or heartburn.    Yes [provider]  aspirin 325 MG tablet Take 1 tablet (325 mg total) by mouth daily. 06/25/19  Yes Regalado, Belkys A, MD  enalapril (VASOTEC) 20 MG tablet Take 20 mg by mouth daily. 01/31/20  Yes [provider]  estradiol (ESTRACE) 0.1 MG/GM vaginal cream Estrogen Cream Instruction Discard applicator Apply pea sized amount to tip of finger to urethra before bed. Wash hands well after application. Use Monday, Wednesday and Friday 01/12/21  Yes Vaillancourt, Aldona Bar, PA-C  feeding supplement, ENSURE ENLIVE, (ENSURE ENLIVE) LIQD Take 237 mLs by mouth 2 (two) times daily between meals. 06/25/19  Yes Regalado, Cassie Freer, MD  FLONASE ALLERGY RELIEF 50 MCG/ACT nasal spray Place 2 sprays into both nostrils daily. 08/16/21  Yes [provider]  gabapentin (NEURONTIN) 100 MG capsule Take 2 capsules by mouth at bedtime. 10/11/21  Yes [provider]  loperamide (IMODIUM) 2 MG capsule Take 2 mg by mouth 4 (four) times daily as needed for diarrhea or loose stools.   Yes [provider]  mirabegron ER (MYRBETRIQ) 25 MG TB24 tablet Take 1 tablet (25 mg total) by mouth daily. 01/12/21  Yes Vaillancourt, Samantha, PA-C  Multiple Vitamin (DAILY VITES) tablet Take 1 tablet by mouth daily.   Yes [provider]  omeprazole (PRILOSEC) 20 MG capsule TAKE 1 CAPSULE BY MOUTH EVERY DAY. 11/15/17  Yes Leone Haven, MD  propranolol (INDERAL) 40 MG tablet Take 1 tablet (40 mg total) by mouth daily. Patient taking differently: Take 40 mg by mouth 2 (two) times daily. 06/26/19  Yes Regalado, Belkys A, MD  psyllium (REGULOID) 0.52 g capsule Take 0.52 g by mouth  in the morning and at bedtime.   Yes [provider]  senna (SENOKOT) 8.6 MG TABS tablet Take 1 tablet by mouth at bedtime.   Yes [provider]  sertraline (ZOLOFT) 100 MG tablet TAKE ONE TABLET BY MOUTH EVERY DAY 11/15/17  Yes Leone Haven, MD  tapentadol (NUCYNTA) 50 MG tablet Take 50 mg by mouth 3 (three) times daily.   Yes [provider]  verapamil (VERELAN PM) 240 MG 24 hr capsule Take 240 mg by mouth at bedtime.   Yes [provider]  Cholecalciferol (VITAMIN D3) 50 MCG (2000 UT) capsule Take 2,000 Units by mouth daily. Patient not taking: Reported on 11/01/2021    [provider]  gabapentin (NEURONTIN) 300 MG capsule Take 1 capsule (300  mg total) by mouth at bedtime. Patient not taking: Reported on 11/01/2021 12/06/17   Leone Haven, MD  guaifenesin (ROBITUSSIN) 100 MG/5ML syrup Take 200 mg by mouth every 6 (six) hours as needed for cough.    [provider]  lactulose (CHRONULAC) 10 GM/15ML solution Take 15 mLs by mouth daily as needed. 10/23/21   [provider]  magnesium hydroxide (MILK OF MAGNESIA) 400 MG/5ML suspension Take 30 mLs by mouth daily as needed for mild constipation.    [provider]  nitroGLYCERIN (NITROSTAT) 0.4 MG SL tablet Place 0.4 mg under the tongue every 5 (five) minutes as needed for chest pain.    [provider]  nystatin cream (MYCOSTATIN) Apply 1 application  topically as needed (panniculitis). Apply to pannus    [provider]  polyethylene glycol (MIRALAX / GLYCOLAX) 17 g packet Take 17 g by mouth daily. 06/26/19   Regalado, Jerald Kief A, MD  PREMARIN vaginal cream Place 0.625 g vaginally. Every week on Monday, Wednesday and friday 07/30/21   [provider]  sodium chloride (OCEAN) 0.65 % nasal spray Place 1 spray into the nose every 12 (twelve) hours as needed. 09/06/21   [provider]  VENTOLIN HFA 108 (90 Base) MCG/ACT inhaler Inhale 2 puffs  into the lungs every 4 (four) hours as needed. 12/04/20   [provider]     Critical care time: 50 minutes     Darel Hong, AGACNP-BC [facility] [place] epic messenger for cross cover needs If after hours, please call E-link

## 2021-11-01 NOTE — ED Notes (Signed)
Patient in CT at this time.

## 2021-11-01 NOTE — Progress Notes (Signed)
PHARMACIST - PHYSICIAN COMMUNICATION  CONCERNING:  Enoxaparin (Lovenox) for DVT Prophylaxis   DESCRIPTION: Patient was prescribed enoxaprin '40mg'$  q24 hours for VTE prophylaxis.   Filed Weights   11/01/21 0959  Weight: 71.7 kg (158 lb 1.1 oz)    Body mass index is 30.87 kg/m.  Estimated Creatinine Clearance: 23.9 mL/min (A) (by C-G formula based on SCr of 1.16 mg/dL (H)).   Patient is candidate for enoxaparin '30mg'$  every 24 hours based on CrCl <103m/min or Weight <45kg  RECOMMENDATION: [facility].  Patient is now receiving enoxaparin 30 mg every 24 hours    MDarnelle Bos PharmD Clinical Pharmacist  11/01/2021 4:02 PM

## 2021-11-01 NOTE — ED Provider Notes (Signed)
[facility] Provider Note    Event Date/Time   First MD Initiated Contact with Patient 11/01/21 1047     (approximate)   History   Wound Infection   HPI  Cindy Graham is a 88 y.o. female  with pmh HTN, HLD, COPD who presents with right lower extremity pain.  Patient's son notes that she has been treated for cellulitis in the past with antibiotics for this right lower extremity.  On Thursday of last week she dropped a book on the leg and since then has had increasing pain redness and swelling.  Nominal pain urinary symptoms shortness of breath nausea vomiting.    Past Medical History:  Diagnosis Date   Anxiety    Arthritis    osteo   Asthma    COPD (chronic obstructive pulmonary disease) (HCC)    GERD (gastroesophageal reflux disease)    Heart murmur    Hernia    Hyperlipidemia    Hypertension    Melanoma ([place])    face   Syncope and collapse    Urine incontinence     Patient Active Problem List   Diagnosis Date Noted   Tachycardia    Sepsis ([place]) 03/17/2020   Severe sepsis ([place]) 03/16/2020   COPD (chronic obstructive pulmonary disease) (HCC)    Elevated troponin    Displaced intertrochanteric fracture of right femur, initial encounter for closed fracture ([place])    Acute kidney injury (AKI) with acute tubular necrosis (ATN) ([place]) 02/27/2019   Stroke (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/22/2019   Cellulitis 06/12/2018   Hyponatremia 11/24/2017   Headache 11/24/2017   Wound of left leg 11/01/2017   Constipation 09/05/2017   Other fatigue 12/22/2016   Tremor 03/29/2016   Amaurosis fugax 12/25/2015   Asthma 09/17/2015   PVD (peripheral vascular disease) ([place]) 05/28/2015   B12 deficiency 08/29/2014   Chronic back pain greater than 3 months duration 04/23/2013   Acute renal failure superimposed on stage 3a chronic kidney disease ([place]) 04/12/2012   Hyperlipidemia LDL goal < 100 03/05/2012   UTI (urinary tract infection) 07/26/2011   Osteoarthritis  07/06/2011   Hypertension 02/21/2011   Depression/Anxiety 02/21/2011     Physical Exam  Triage Vital Signs: ED Triage Vitals  Enc Vitals Group     BP 11/01/21 0957 (!) 101/49     Pulse Rate 11/01/21 0957 77     Resp 11/01/21 0957 16     Temp 11/01/21 0957 98.6 F (37 C)     Temp Source 11/01/21 0957 Oral     SpO2 11/01/21 0957 91 %     Weight 11/01/21 0959 158 lb 1.1 oz (71.7 kg)     Height 11/01/21 0959 5' (1.524 m)     Head Circumference --      Peak Flow --      Pain Score 11/01/21 0959 0     Pain Loc --      Pain Edu? --      Excl. in [place]? --     Most recent vital signs: Vitals:   11/01/21 1430 11/01/21 1445  BP: (!) 84/48 127/60  Pulse: 69 70  Resp: 19 20  Temp:    SpO2: 98% 99%     General: Awake, no distress.  CV:  Good peripheral perfusion.  Resp:  Normal effort.  Abd:  No distention.  Soft and nontender throughout Neuro:             Awake, Alert, Oriented x 3 hard of  hearing Other:  Right lower extremity is erythematous discolored compared to left, it is warm tender but compartments are soft there is no crepitus there is a small open wound on the medial aspect no purulence   ED Results / Procedures / Treatments  Labs (all labs ordered are listed, but only abnormal results are displayed) Labs Reviewed  CBC - Abnormal; Notable for the following components:      Result Value   WBC 23.1 (*)    RBC 3.62 (*)    Hemoglobin 10.9 (*)    HCT 34.2 (*)    All other components within normal limits  BASIC METABOLIC PANEL - Abnormal; Notable for the following components:   Glucose, Bld 134 (*)    BUN 30 (*)    Creatinine, Ser 1.16 (*)    Calcium 8.5 (*)    GFR, Estimated 43 (*)    All other components within normal limits  URINALYSIS, ROUTINE W REFLEX MICROSCOPIC - Abnormal; Notable for the following components:   Color, Urine AMBER (*)    APPearance HAZY (*)    Hgb urine dipstick SMALL (*)    Protein, ur 30 (*)    Leukocytes,Ua LARGE (*)    Bacteria, UA  MANY (*)    All other components within normal limits  URINE CULTURE  CULTURE, BLOOD (ROUTINE X 2)  CULTURE, BLOOD (ROUTINE X 2)  LACTIC ACID, PLASMA     EKG  EKG interpretation performed by myself: NSR, nml axis, nml intervals, no acute ischemic changes, lateral Q waves    RADIOLOGY    PROCEDURES:  Critical Care performed: Yes, see critical care procedure note(s)  .Critical Care  Performed by: Rada Hay, MD Authorized by: Rada Hay, MD   Critical care provider statement:    Critical care time (minutes):  30   Critical care was time spent personally by me on the following activities:  Development of treatment plan with patient or surrogate, discussions with consultants, evaluation of patient's response to treatment, examination of patient, ordering and review of laboratory studies, ordering and review of radiographic studies, ordering and performing treatments and interventions, pulse oximetry, re-evaluation of patient's condition and review of old charts .1-3 Lead EKG Interpretation  Performed by: Rada Hay, MD Authorized by: Rada Hay, MD     Interpretation: normal     ECG rate assessment: normal     Ectopy: none     Conduction: normal     The patient is on the cardiac monitor to evaluate for evidence of arrhythmia and/or significant heart rate changes.   MEDICATIONS ORDERED IN ED: Medications  cefTRIAXone (ROCEPHIN) 2 g in sodium chloride 0.9 % 100 mL IVPB (0 g Intravenous Stopped 11/01/21 1225)  norepinephrine (LEVOPHED) '4mg'$  in 226m (0.016 mg/mL) premix infusion (2 mcg/min Intravenous New Bag/Given 11/01/21 1437)  vancomycin (VANCOREADY) IVPB 1500 mg/300 mL (has no administration in time range)  sodium chloride 0.9 % bolus 2,000 mL (0 mLs Intravenous Stopped 11/01/21 1358)  lactated ringers bolus 250 mL (250 mLs Intravenous New Bag/Given 11/01/21 1358)     IMPRESSION / MDM / ASSESSMENT AND PLAN / ED COURSE  I reviewed the  triage vital signs and the nursing notes.                              Patient's presentation is most consistent with acute presentation with potential threat to life or bodily function.  Differential diagnosis includes,  but is not limited to, cellulitis, DVT, UTI, pneumonia  Is a 88 year old female who comes from her facility with fever and right leg wound.  Patient dropped a book on her right lower extremity several days ago and has since developed right lower extremity swelling and redness and apparently had a fever at [employer].  Patient's blood pressure is soft but vitals are otherwise within normal limits.  On exam the right lower extremity does appear cellulitic.  There is no crepitus she has good pulses compartment is soft.  Otherwise her exam is nonfocal abdomen is soft nontender she has no increased work of breathing and other than being hard of hearing is neurologically intact.  She has a significant leukocytosis 3.  Lactate is normal.  Creatinine around baseline.  We will obtain blood cultures and give her a fluid bolus.  We will treat for cellulitis with Rocephin.  DVT study was also ordered from triage.  We will also work-up for other causes of sepsis by obtaining a UA and chest x-ray.  She will need admission.   On repeat vital sign assessment patient is hypotensive.  She is receiving 30 cc/kg fluid bolus.  If maps persistently low will need pressors.  I did reassess the patient given her cellulitis seems somewhat out of proportion to her leukocytosis and blood pressure.  She continues to have no complaints.  On palpation abdomen she does say it is somewhat tender.  She had a CT abdomen pelvis to rule out any other source of infection which does not have any acute findings.  Patient's blood pressure after 30 cc/kg fluid is still below 65.  Has started on Levophed. Will discuss with ICU for admission.   FINAL CLINICAL IMPRESSION(S) / ED DIAGNOSES   Final diagnoses:  Cellulitis  of right lower extremity  Septic shock ([place])     Rx / DC Orders   ED Discharge Orders     None        Note:  This document was prepared using Dragon voice recognition software and may include unintentional dictation errors.   Rada Hay, MD 11/01/21 1517

## 2021-11-01 NOTE — Progress Notes (Signed)
Notified provider and bedside nurse of need to order and administer fluid bolus.  2151 cc needed

## 2021-11-01 NOTE — ED Triage Notes (Signed)
Pt here from [facility] with a right leg wound found on Sat. Pt's leg is warm compared to the other.Pt having pain as well, leg wrapped in gauze. Vitals WNL/

## 2021-11-02 ENCOUNTER — Inpatient Hospital Stay
Admit: 2021-11-02 | Discharge: 2021-11-02 | Disposition: A | Discharge status: Home / Self Care | Payer: Medicare Other | Attending: Internal Medicine | Admitting: Internal Medicine

## 2021-11-02 DIAGNOSIS — N1831 Chronic kidney disease, stage 3a: Secondary | ICD-10-CM

## 2021-11-02 DIAGNOSIS — N3001 Acute cystitis with hematuria: Secondary | ICD-10-CM | POA: Diagnosis not present

## 2021-11-02 DIAGNOSIS — G629 Polyneuropathy, unspecified: Secondary | ICD-10-CM

## 2021-11-02 DIAGNOSIS — L03115 Cellulitis of right lower limb: Secondary | ICD-10-CM | POA: Diagnosis not present

## 2021-11-02 DIAGNOSIS — A419 Sepsis, unspecified organism: Secondary | ICD-10-CM | POA: Diagnosis not present

## 2021-11-02 DIAGNOSIS — K219 Gastro-esophageal reflux disease without esophagitis: Secondary | ICD-10-CM

## 2021-11-02 DIAGNOSIS — R778 Other specified abnormalities of plasma proteins: Secondary | ICD-10-CM

## 2021-11-02 DIAGNOSIS — F419 Anxiety disorder, unspecified: Secondary | ICD-10-CM

## 2021-11-02 DIAGNOSIS — E876 Hypokalemia: Secondary | ICD-10-CM

## 2021-11-02 DIAGNOSIS — N183 Chronic kidney disease, stage 3 unspecified: Secondary | ICD-10-CM | POA: Diagnosis present

## 2021-11-02 LAB — CBC
HCT: 32.7 % — ABNORMAL LOW (ref 36.0–46.0)
Hemoglobin: 10.5 g/dL — ABNORMAL LOW (ref 12.0–15.0)
MCH: 30.5 pg (ref 26.0–34.0)
MCHC: 32.1 g/dL (ref 30.0–36.0)
MCV: 95.1 fL (ref 80.0–100.0)
Platelets: 176 10*3/uL (ref 150–400)
RBC: 3.44 MIL/uL — ABNORMAL LOW (ref 3.87–5.11)
RDW: 15.3 % (ref 11.5–15.5)
WBC: 19.7 10*3/uL — ABNORMAL HIGH (ref 4.0–10.5)
nRBC: 0 % (ref 0.0–0.2)

## 2021-11-02 LAB — BASIC METABOLIC PANEL
Anion gap: 7 (ref 5–15)
BUN: 32 mg/dL — ABNORMAL HIGH (ref 8–23)
CO2: 23 mmol/L (ref 22–32)
Calcium: 8 mg/dL — ABNORMAL LOW (ref 8.9–10.3)
Chloride: 106 mmol/L (ref 98–111)
Creatinine, Ser: 1.04 mg/dL — ABNORMAL HIGH (ref 0.44–1.00)
GFR, Estimated: 49 mL/min — ABNORMAL LOW (ref >60)
Glucose, Bld: 103 mg/dL — ABNORMAL HIGH (ref 70–99)
Potassium: 3.2 mmol/L — ABNORMAL LOW (ref 3.5–5.1)
Sodium: 136 mmol/L (ref 135–145)

## 2021-11-02 LAB — PHOSPHORUS: Phosphorus: 2.9 mg/dL (ref 2.5–4.6)

## 2021-11-02 LAB — HEMOGLOBIN A1C
Hgb A1c MFr Bld: 5.7 % — ABNORMAL HIGH (ref 4.8–5.6)
Mean Plasma Glucose: 116.89 mg/dL

## 2021-11-02 LAB — PROCALCITONIN: Procalcitonin: 3.94 ng/mL

## 2021-11-02 LAB — MAGNESIUM: Magnesium: 1.6 mg/dL — ABNORMAL LOW (ref 1.7–2.4)

## 2021-11-02 MED ORDER — POTASSIUM CHLORIDE CRYS ER 20 MEQ PO TBCR
40.0000 meq | EXTENDED_RELEASE_TABLET | Freq: Once | ORAL | Status: AC
  Administered 2021-11-02: 40 meq via ORAL
  Filled 2021-11-02: qty 2

## 2021-11-02 MED ORDER — CEFAZOLIN SODIUM-DEXTROSE 1-4 GM/50ML-% IV SOLN
1.0000 g | Freq: Two times a day (BID) | INTRAVENOUS | Status: DC
  Administered 2021-11-02 – 2021-11-03 (×2): 1 g via INTRAVENOUS
  Filled 2021-11-02 (×6): qty 50

## 2021-11-02 MED ORDER — MAGNESIUM SULFATE 2 GM/50ML IV SOLN
2.0000 g | Freq: Once | INTRAVENOUS | Status: AC
  Administered 2021-11-02: 2 g via INTRAVENOUS
  Filled 2021-11-02: qty 50

## 2021-11-02 MED ORDER — PANTOPRAZOLE SODIUM 40 MG PO TBEC
40.0000 mg | DELAYED_RELEASE_TABLET | Freq: Every day | ORAL | Status: DC
  Administered 2021-11-03 – 2021-11-05 (×3): 40 mg via ORAL
  Filled 2021-11-02 (×3): qty 1

## 2021-11-02 MED ORDER — SENNA 8.6 MG PO TABS
1.0000 | ORAL_TABLET | Freq: Every day | ORAL | Status: DC
  Administered 2021-11-02 – 2021-11-04 (×3): 8.6 mg via ORAL
  Filled 2021-11-02 (×3): qty 1

## 2021-11-02 MED ORDER — ENSURE ENLIVE PO LIQD
237.0000 mL | Freq: Two times a day (BID) | ORAL | Status: DC
  Administered 2021-11-03 – 2021-11-05 (×5): 237 mL via ORAL

## 2021-11-02 MED ORDER — GABAPENTIN 100 MG PO CAPS
200.0000 mg | ORAL_CAPSULE | Freq: Every day | ORAL | Status: DC
  Administered 2021-11-02 – 2021-11-04 (×3): 200 mg via ORAL
  Filled 2021-11-02 (×3): qty 2

## 2021-11-02 MED ORDER — SALINE NASAL SPRAY 0.65 % NA SOLN
1.0000 | Freq: Two times a day (BID) | NASAL | Status: DC | PRN
Start: 2021-11-02 — End: 2021-11-02

## 2021-11-02 MED ORDER — FLUTICASONE PROPIONATE 50 MCG/ACT NA SUSP
2.0000 | Freq: Every day | NASAL | Status: DC
Start: 2021-11-03 — End: 2021-11-05
  Administered 2021-11-03 – 2021-11-05 (×3): 2 via NASAL
  Filled 2021-11-02 (×2): qty 16

## 2021-11-02 NOTE — Assessment & Plan Note (Signed)
Replaced 

## 2021-11-02 NOTE — Assessment & Plan Note (Addendum)
Creatinine for several days is actually been better than baseline, under 1 with GFR greater than 60.  On day of discharge, creatinine at 0.73 with GFR greater than 60

## 2021-11-02 NOTE — Assessment & Plan Note (Signed)
Follow-up urine culture.  Received cefepime on admission we will change over to Ancef.

## 2021-11-02 NOTE — Evaluation (Signed)
Physical Therapy Evaluation Patient Details Name: Cindy Graham MRN: [id number] DOB: 08-Aug-1921 Today's Date: 11/02/2021  History of Present Illness  Pt is a 88 year old female admitted with severe sepsis with septic shock in the setting of right lower extremity cellulitis and UTI. PMH of COPD, HFpEF, hypertension, hyperlipidemia, right lower extremity celluliti, anxiety, asthma, CVA, syncope, tremor, CKDIII, previous fracture of C1 posterior arch and right C1 and C2 transverse processes and transverse foramina.   Clinical Impression  Patient alert, agreeable to PT oriented x4. Pt reported at baseline she is able to stand pivot to [facility] to <> from commode, [employer] resident, and that she sometimes needs help with her ADLs. Facility performs IADLs.   The patient was able to lift all extremities against gravity. With extended time, she was able to transfer to EOB with HOB elevated and bed rails. Fair sitting balance. Pt able to stand with CGA, but ulitmately modA to squat pivot to recliner in room. Pt did state in the middle of her transfer sometimes she does need assistance to transfer safely. Also reported she was trying to work with PT previously to try to walk.  Overall the patient demonstrated deficits (see "PT Problem List") that impede the patient's functional abilities, safety, and mobility and would benefit from skilled PT intervention. Recommendation at this time is to return to LTC with PT follow to maximize function and independence. May need to consider SNF if patient does not have adequate support at LTC due to decline in functional mobility.        Recommendations for follow up therapy are one component of a multi-disciplinary discharge planning process, led by the attending physician.  Recommendations may be updated based on patient status, additional functional criteria and insurance authorization.  Follow Up Recommendations Other (comment) (return to LTC with PT follow up)     Assistance Recommended at Discharge Frequent or constant Supervision/Assistance  Patient can return home with the following  A little help with walking and/or transfers;A little help with bathing/dressing/bathroom;Assistance with cooking/housework;Assist for transportation;Help with stairs or ramp for entrance;Direct supervision/assist for medications management    Equipment Recommendations    Recommendations for Other Services       Functional Status Assessment Patient has had a recent decline in their functional status and demonstrates the ability to make significant improvements in function in a reasonable and predictable amount of time.     Precautions / Restrictions Precautions Precautions: Fall Restrictions Weight Bearing Restrictions: No      Mobility  Bed Mobility Overal bed mobility: Needs Assistance Bed Mobility: Supine to Sit     Supine to sit: Min guard, HOB elevated     General bed mobility comments: bed rails, extended time    Transfers Overall transfer level: Needs assistance Equipment used: 1 person hand held assist Transfers: Bed to chair/wheelchair/BSC       Squat pivot transfers: Mod assist     General transfer comment: pt able to start transfer    Ambulation/Gait               General Gait Details: deferred, pt does not ambulate at baseline (stated she previously was working with PT and was trying to walk then)  Science writer    Modified Rankin (Stroke Patients Only)       Balance Overall balance assessment: Needs assistance Sitting-balance support: Feet supported Sitting balance-Leahy Scale: Fair     Standing balance support:  Bilateral upper extremity supported Standing balance-Leahy Scale: Poor                               Pertinent Vitals/Pain Pain Assessment Pain Assessment: No/denies pain    Home Living Family/patient expects to be discharged to:: Assisted living                    Additional Comments: LTC resident at [facility]    Prior Function Prior Level of Function : Needs assist       Physical Assist : Mobility (physical);ADLs (physical) Mobility (physical): Transfers ADLs (physical): Grooming;Bathing;Dressing;Toileting Mobility Comments: pt reported she is able to stand pivot to [facility] at baseline without assist; but mid transfer stated sometimes she does need help and they help her ADLs Comments: pt reported she is able to do her ADLs, and that they can assist her if they need to     Hand Dominance   Dominant Hand: Right    Extremity/Trunk Assessment   Upper Extremity Assessment Upper Extremity Assessment: Generalized weakness    Lower Extremity Assessment Lower Extremity Assessment: Generalized weakness    Cervical / Trunk Assessment Cervical / Trunk Assessment: Kyphotic  Communication   Communication: HOH  Cognition Arousal/Alertness: Awake/alert Behavior During Therapy: WFL for tasks assessed/performed Overall Cognitive Status: Within Functional Limits for tasks assessed                                          General Comments      Exercises     Assessment/Plan    PT Assessment Patient needs continued PT services  PT Problem List Decreased strength;Decreased mobility;Decreased activity tolerance;Decreased balance       PT Treatment Interventions DME instruction;Therapeutic exercise;Gait training;Balance training;Neuromuscular re-education;Functional mobility training;Therapeutic activities;Patient/family education    PT Goals (Current goals can be found in the Care Plan section)  Acute Rehab PT Goals Patient Stated Goal: to get back to normal PT Goal Formulation: With patient Time For Goal Achievement: 11/16/21 Potential to Achieve Goals: Fair    Frequency Min 2X/week     Co-evaluation               AM-PAC PT "6 Clicks" Mobility  Outcome Measure Help needed turning from  your back to your side while in a flat bed without using bedrails?: A Little Help needed moving from lying on your back to sitting on the side of a flat bed without using bedrails?: A Little Help needed moving to and from a bed to a chair (including a wheelchair)?: A Little Help needed standing up from a chair using your arms (e.g., wheelchair or bedside chair)?: A Little Help needed to walk in hospital room?: Total Help needed climbing 3-5 steps with a railing? : Total 6 Click Score: 14    End of Session Equipment Utilized During Treatment: Gait belt Activity Tolerance: Patient tolerated treatment well Patient left: with call bell/phone within reach;in chair;with chair alarm set Nurse Communication: Mobility status PT Visit Diagnosis: Other abnormalities of gait and mobility (R26.89);Difficulty in walking, not elsewhere classified (R26.2);Muscle weakness (generalized) (M62.81)    Time: 4481-8563 PT Time Calculation (min) (ACUTE ONLY): 18 min   Charges:   PT Evaluation $PT Eval Low Complexity: 1 Low PT Treatments $Therapeutic Activity: 8-22 mins        Lieutenant Diego PT,  DPT 3:34 PM,11/02/21

## 2021-11-02 NOTE — Progress Notes (Signed)
PHARMACY NOTE:  ANTIMICROBIAL RENAL DOSAGE ADJUSTMENT  Current antimicrobial regimen includes a mismatch between antimicrobial dosage and estimated renal function.  As per policy approved by the Pharmacy & Therapeutics and Medical Executive Committees, the antimicrobial dosage will be adjusted accordingly.  Current antimicrobial dosage:   cefazolin 1 gm IV q8h  Indication: cellulitis  Renal Function:  Estimated Creatinine Clearance: 26.7 mL/min (A) (by C-G formula based on SCr of 1.04 mg/dL (H)).  Antimicrobial dosage has been changed to:  cefazolin 1 gm IV q12h  Additional comments:   Thank you for allowing pharmacy to be a part of this patient's care.  [place], [facility] 11/02/2021 11:25 AM

## 2021-11-02 NOTE — Assessment & Plan Note (Addendum)
Antibiotics changed to Ancef on 6/14.  Still with warmth and redness of the right lower extremity.  With normalized white count, change to p.o. Keflex

## 2021-11-02 NOTE — Progress Notes (Signed)
Progress Note   Patient: Cindy Graham DXI:[id number] DOB: 09-Oct-1921 DOA: 11/01/2021     1 DOS: the patient was seen and examined on 11/02/2021   Brief hospital course: 88 year old female with past medical history of anxiety, arthritis, asthma/COPD, GERD, hyperlipidemia, hypertension, melanoma.  She was admitted with septic shock requiring pressors and ICU management with right lower extremity cellulitis.  Patient was briefly on pressors and able to come off pressors with good blood pressure.  Patient transferred to the medical service on 11/02/2021.  Assessment and Plan: * Septic shock ([place]) Present on admission secondary to right lower extremity cellulitis.  Patient with leukocytosis, hypotension and tachypnea.  So far blood cultures negative.  Patient was briefly was on Levophed.  Blood pressure stable and patient able to be transferred out of the ICU to the floor.  Change vancomycin and cefepime over to Ancef.  Continue to hold antihypertensive medications including the Verelan PM, enalapril.  Cellulitis of right lower extremity Change antibiotics over to Ancef.  Still with warmth and redness of the right lower extremity.  Acute cystitis with hematuria Follow-up urine culture.  Received cefepime on admission we will change over to Ancef.  Anxiety Continue Zoloft  CKD (chronic kidney disease), stage III (HCC) CKD 3A with creatinine of 1.04 today GFR 49.  Hypomagnesemia Replace the magnesium IV today  Hypokalemia Replace potassium  Neuropathy On gabapentin at night  GERD (gastroesophageal reflux disease) Continue PPI  Elevated troponin Likely secondary to septic shock.  Not having any chest pain or shortness of breath.  Obtain echo.        Subjective: Patient feeling okay.  Noticed a small tear on her leg.  Has some redness and warmth and some pain on her right leg.  Admitted with septic shock requiring pressors initially.  Physical Exam: Vitals:   11/02/21 1100  11/02/21 1200 11/02/21 1300 11/02/21 1400  BP: 127/70 (!) 138/59 (!) 146/59 126/60  Pulse: 80 78 76 82  Resp: (!) 21 (!) 23 (!) 22 (!) 28  Temp:  97.7 F (36.5 C)    TempSrc:  Oral    SpO2: 96% 95% 96% 95%  Weight:      Height:       Physical Exam HENT:     Head: Normocephalic.     Mouth/Throat:     Pharynx: No oropharyngeal exudate.  Eyes:     General: Lids are normal.     Conjunctiva/sclera: Conjunctivae normal.  Cardiovascular:     Rate and Rhythm: Normal rate and regular rhythm.     Heart sounds: Murmur heard.     Systolic murmur is present with a grade of 2/6.  Pulmonary:     Breath sounds: Examination of the right-lower field reveals decreased breath sounds. Examination of the left-lower field reveals decreased breath sounds. Decreased breath sounds present. No wheezing, rhonchi or rales.  Abdominal:     Palpations: Abdomen is soft.     Tenderness: There is no abdominal tenderness.  Musculoskeletal:     Right lower leg: Swelling present.     Left lower leg: Swelling present.  Skin:    General: Skin is warm.     Comments: Right leg increased warmth and erythema right lower extremity.  Neurological:     Mental Status: She is alert.     Comments: Answers questions appropriately.     Data Reviewed: So far blood cultures negative, urine culture pending, potassium 3.2, magnesium 1.6, creatinine 1.04  Family Communication: Spoke with family at  the bedside  Disposition: Status is: Inpatient Remains inpatient appropriate because: Being treated for septic shock requiring IV antibiotics   Planned Discharge Destination: [place] assisted living.   Author: Loletha Grayer, MD 11/02/2021 4:07 PM  For on call review [email or web address].

## 2021-11-02 NOTE — Assessment & Plan Note (Signed)
On gabapentin at night

## 2021-11-02 NOTE — Hospital Course (Signed)
88 year old female with past medical history of anxiety, arthritis, asthma/COPD, GERD, hyperlipidemia, hypertension, melanoma.  She was admitted with septic shock requiring pressors and ICU management with right lower extremity cellulitis.  Patient was briefly on pressors and able to come off pressors with good blood pressure.  Patient transferred to the medical service on 11/02/2021.

## 2021-11-02 NOTE — Assessment & Plan Note (Addendum)
Met criteria for septic shock on admission given leukocytosis, tachypnea and persistent hypotension requiring pressor support.  Present on admission secondary to right lower extremity cellulitis.  Patient with leukocytosis, hypotension and tachypnea.  So far blood cultures negative.  Patient was briefly was on Levophed.  Blood pressure stable and patient able to be transferred out of the ICU to the floor.  Change vancomycin and cefepime over to Ancef.  Changed to Keflex 6/15 after white count normalized.  And she will be discharged on this for 7 days of antibiotics total.

## 2021-11-02 NOTE — Assessment & Plan Note (Signed)
Continue PPI ?

## 2021-11-02 NOTE — Progress Notes (Signed)
Report given to RN Mickel Baas for rm 159, pt belongings with pt, RN notified pt daughter of transfer.

## 2021-11-02 NOTE — Assessment & Plan Note (Signed)
Continue Zoloft 

## 2021-11-02 NOTE — Assessment & Plan Note (Addendum)
Replaced with IV magnesium.

## 2021-11-02 NOTE — Assessment & Plan Note (Addendum)
Likely secondary to septic shock.  Not having any chest pain or shortness of breath.  Echocardiogram notes grade 2 diastolic dysfunction

## 2021-11-03 DIAGNOSIS — I5032 Chronic diastolic (congestive) heart failure: Secondary | ICD-10-CM | POA: Diagnosis not present

## 2021-11-03 DIAGNOSIS — L8990 Pressure ulcer of unspecified site, unspecified stage: Secondary | ICD-10-CM | POA: Diagnosis present

## 2021-11-03 DIAGNOSIS — I5033 Acute on chronic diastolic (congestive) heart failure: Secondary | ICD-10-CM | POA: Clinically undetermined

## 2021-11-03 DIAGNOSIS — A419 Sepsis, unspecified organism: Secondary | ICD-10-CM | POA: Diagnosis not present

## 2021-11-03 DIAGNOSIS — N3001 Acute cystitis with hematuria: Secondary | ICD-10-CM | POA: Diagnosis not present

## 2021-11-03 DIAGNOSIS — L03115 Cellulitis of right lower limb: Secondary | ICD-10-CM | POA: Diagnosis not present

## 2021-11-03 DIAGNOSIS — E669 Obesity, unspecified: Secondary | ICD-10-CM | POA: Diagnosis present

## 2021-11-03 LAB — BASIC METABOLIC PANEL
Anion gap: 8 (ref 5–15)
BUN: 32 mg/dL — ABNORMAL HIGH (ref 8–23)
CO2: 23 mmol/L (ref 22–32)
Calcium: 7.9 mg/dL — ABNORMAL LOW (ref 8.9–10.3)
Chloride: 105 mmol/L (ref 98–111)
Creatinine, Ser: 0.75 mg/dL (ref 0.44–1.00)
GFR, Estimated: >60 mL/min (ref >60)
Glucose, Bld: 101 mg/dL — ABNORMAL HIGH (ref 70–99)
Potassium: 3.4 mmol/L — ABNORMAL LOW (ref 3.5–5.1)
Sodium: 136 mmol/L (ref 135–145)

## 2021-11-03 LAB — URINE CULTURE: Culture: 50000 — AB

## 2021-11-03 LAB — CBC
HCT: 32.2 % — ABNORMAL LOW (ref 36.0–46.0)
Hemoglobin: 10.3 g/dL — ABNORMAL LOW (ref 12.0–15.0)
MCH: 30 pg (ref 26.0–34.0)
MCHC: 32 g/dL (ref 30.0–36.0)
MCV: 93.9 fL (ref 80.0–100.0)
Platelets: 228 10*3/uL (ref 150–400)
RBC: 3.43 MIL/uL — ABNORMAL LOW (ref 3.87–5.11)
RDW: 15.1 % (ref 11.5–15.5)
WBC: 14.2 10*3/uL — ABNORMAL HIGH (ref 4.0–10.5)
nRBC: 0 % (ref 0.0–0.2)

## 2021-11-03 LAB — MAGNESIUM: Magnesium: 2 mg/dL (ref 1.7–2.4)

## 2021-11-03 LAB — PHOSPHORUS: Phosphorus: 1.8 mg/dL — ABNORMAL LOW (ref 2.5–4.6)

## 2021-11-03 LAB — PROCALCITONIN: Procalcitonin: 2.4 ng/mL

## 2021-11-03 MED ORDER — POTASSIUM CHLORIDE CRYS ER 20 MEQ PO TBCR
20.0000 meq | EXTENDED_RELEASE_TABLET | Freq: Once | ORAL | Status: AC
  Administered 2021-11-03: 20 meq via ORAL
  Filled 2021-11-03: qty 1

## 2021-11-03 MED ORDER — CEFAZOLIN SODIUM-DEXTROSE 1-4 GM/50ML-% IV SOLN
1.0000 g | Freq: Three times a day (TID) | INTRAVENOUS | Status: DC
  Administered 2021-11-03 – 2021-11-04 (×3): 1 g via INTRAVENOUS
  Filled 2021-11-03 (×3): qty 50

## 2021-11-03 MED ORDER — ENOXAPARIN SODIUM 40 MG/0.4ML IJ SOSY
40.0000 mg | PREFILLED_SYRINGE | INTRAMUSCULAR | Status: DC
  Administered 2021-11-03 – 2021-11-04 (×2): 40 mg via SUBCUTANEOUS
  Filled 2021-11-03 (×2): qty 0.4

## 2021-11-03 MED ORDER — K PHOS MONO-SOD PHOS DI & MONO 155-852-130 MG PO TABS
500.0000 mg | ORAL_TABLET | ORAL | Status: AC
  Administered 2021-11-03 – 2021-11-04 (×4): 500 mg via ORAL
  Filled 2021-11-03: qty 2
  Filled 2021-11-03: qty 1
  Filled 2021-11-03 (×3): qty 2

## 2021-11-03 NOTE — NC FL2 (Signed)
[place] LEVEL OF CARE SCREENING TOOL     IDENTIFICATION  Patient Name: Cindy Graham Birthdate: Jul 18, 1921 Sex: female Admission Date (Current Location): 11/01/2021  [facility] and [place] Number:  Engineering geologist and Address:  [facility], [address], [place], [place] [address]      Provider Number: [id number]  Attending Physician Name and Address:  Annita Brod, MD  Relative Name and Phone Number:       Current Level of Care: Hospital Recommended Level of Care: Assisted Living Facility Prior Approval Number:    Date Approved/Denied:   PASRR Number:    Discharge Plan: Other (Comment) (ALF)    Current Diagnoses: Patient Active Problem List   Diagnosis Date Noted   Pressure injury of skin 11/03/2021   Cellulitis of right lower extremity 11/02/2021   Acute cystitis with hematuria 11/02/2021   Anxiety 11/02/2021   GERD (gastroesophageal reflux disease) 11/02/2021   Neuropathy 11/02/2021   Hypokalemia 11/02/2021   Hypomagnesemia 11/02/2021   CKD (chronic kidney disease), stage III ([place]) 11/02/2021   Septic shock ([place]) 11/01/2021   Tachycardia    Sepsis ([place]) 03/17/2020   Severe sepsis ([place]) 03/16/2020   COPD (chronic obstructive pulmonary disease) ([place])    Elevated troponin    Displaced intertrochanteric fracture of right femur, initial encounter for closed fracture ([place])    Acute kidney injury (AKI) with acute tubular necrosis (ATN) ([place]) 02/27/2019   Stroke (cerebrum) ([place]) 02/22/2019   Cellulitis 06/12/2018   Hyponatremia 11/24/2017   Headache 11/24/2017   Wound of left leg 11/01/2017   Constipation 09/05/2017   Other fatigue 12/22/2016   Tremor 03/29/2016   Amaurosis fugax 12/25/2015   Asthma 09/17/2015   PVD (peripheral vascular disease) ([place]) 05/28/2015   B12 deficiency 08/29/2014   Chronic back pain greater than 3 months duration 04/23/2013   Acute renal failure superimposed on stage 3a chronic  kidney disease ([place]) 04/12/2012   Hyperlipidemia LDL goal < 100 03/05/2012   UTI (urinary tract infection) 07/26/2011   Osteoarthritis 07/06/2011   Hypertension 02/21/2011   Depression/Anxiety 02/21/2011    Orientation RESPIRATION BLADDER Height & Weight     Self, Situation, Time, Place  Normal Incontinent Weight: 158 lb 1.1 oz (71.7 kg) Height:  5' (152.4 cm)  BEHAVIORAL SYMPTOMS/MOOD NEUROLOGICAL BOWEL NUTRITION STATUS      Incontinent Diet (heart healthy, thin liquids)  AMBULATORY STATUS COMMUNICATION OF NEEDS Skin   Limited Assist Verbally PU Stage and Appropriate Care PU Stage 1 Dressing:  (Coccyx, foam dressing)                     Personal Care Assistance Level of Assistance  Bathing, Feeding, Dressing Bathing Assistance: Limited assistance Feeding assistance: Independent Dressing Assistance: Limited assistance     Functional Limitations Info  Sight, Hearing, Speech Sight Info: Adequate Hearing Info: Adequate Speech Info: Adequate    SPECIAL CARE FACTORS FREQUENCY  PT (By licensed PT), OT (By licensed OT)     PT Frequency: 3x OT Frequency: 3x            Contractures Contractures Info: Not present    Additional Factors Info  Code Status, Allergies Code Status Info: DNR Allergies Info: Statins, Tramadol, Levaquin (Levofloxacin In D5w), Neomycin-polymyxin-gramicidin, Penicillins, Sulfa Antibiotics, Tetracyclines & Related           Current Medications (11/03/2021):  This is the current hospital active medication list Current Facility-Administered Medications  Medication Dose Route Frequency Provider Last  Rate Last Admin   acetaminophen (TYLENOL) tablet 650 mg  650 mg Oral Q4H PRN Darel Hong D, NP   650 mg at 11/02/21 6226   ceFAZolin (ANCEF) IVPB 1 g/50 mL premix  1 g Intravenous Q8H Darrick Penna, RPH       docusate sodium (COLACE) capsule 100 mg  100 mg Oral BID PRN Bradly Bienenstock, NP       enoxaparin (LOVENOX) injection 40 mg  40 mg  Subcutaneous Q24H Darrick Penna, RPH       feeding supplement (ENSURE ENLIVE / ENSURE PLUS) liquid 237 mL  237 mL Oral BID BM Loletha Grayer, MD   237 mL at 11/03/21 1400   fluticasone (FLONASE) 50 MCG/ACT nasal spray 2 spray  2 spray Each Nare Daily Loletha Grayer, MD   2 spray at 11/03/21 0942   gabapentin (NEURONTIN) capsule 200 mg  200 mg Oral QHS Wieting, Richard, MD   200 mg at 11/02/21 2205   ipratropium-albuterol (DUONEB) 0.5-2.5 (3) MG/3ML nebulizer solution 3 mL  3 mL Nebulization Q4H PRN Bradly Bienenstock, NP       ondansetron [facility]) injection 4 mg  4 mg Intravenous Q6H PRN Bradly Bienenstock, NP       pantoprazole (PROTONIX) EC tablet 40 mg  40 mg Oral Daily Loletha Grayer, MD   40 mg at 11/03/21 0907   phosphorus (K PHOS NEUTRAL) tablet 500 mg  500 mg Oral Q4H while awake Gevena Barre K, MD   500 mg at 11/03/21 1400   polyethylene glycol (MIRALAX / GLYCOLAX) packet 17 g  17 g Oral Daily PRN Darel Hong D, NP   17 g at 11/02/21 2206   senna (SENOKOT) tablet 8.6 mg  1 tablet Oral QHS Loletha Grayer, MD   8.6 mg at 11/02/21 2205   sertraline (ZOLOFT) tablet 100 mg  100 mg Oral Daily Darel Hong D, NP   100 mg at 11/03/21 3335     Discharge Medications: Please see discharge summary for a list of discharge medications.  Relevant Imaging Results:  Relevant Lab Results:   Additional Information SSN:[id number]  Gerrianne Scale Braylin Xu, LCSW

## 2021-11-03 NOTE — Assessment & Plan Note (Addendum)
Patient with history of grade 2 diastolic dysfunction and has been getting fluids and pressor support since admission with antihypertensives held.  BNP on 6/15 mildly elevated at 270.  Ordered low-dose Lasix.  Echocardiogram on 6/13 notes continued grade 2 diastolic dysfunction.  Prior to discharge, patient has diuresed almost 2.5 L and is almost -800 mL deficient.

## 2021-11-03 NOTE — Assessment & Plan Note (Addendum)
Pressure Injury 11/02/21 Coccyx Medial Stage 1 -  Intact skin with non-blanchable redness of a localized area usually over a bony prominence. (Active)  11/02/21 1629  Location: Coccyx  Location Orientation: Medial  Staging: Stage 1 -  Intact skin with non-blanchable redness of a localized area usually over a bony prominence.  Wound Description (Comments):   Present on Admission:     Stage I coccyx ulcer.  Present on admission

## 2021-11-03 NOTE — Progress Notes (Signed)
PT Cancellation Note  Patient Details Name: Cindy Graham MRN: [id number] DOB: 30-Nov-1921   Cancelled Treatment:    Reason Eval/Treat Not Completed: Other (comment). Pt politely declining PT, reported she was up in the chair for "too long". Currently eating ice cream. PT to re-attempt as able.    Lieutenant Diego PT, DPT 1:44 PM,11/03/21

## 2021-11-03 NOTE — Progress Notes (Signed)
[facility] Progress Note  Patient: Cindy Graham    KDX:[id number]  DOA: 11/01/2021    Date of Service: the patient was seen and examined on 11/03/2021  Brief hospital course: 88 year old female with past medical history of anxiety, arthritis, asthma/COPD, GERD, hyperlipidemia, hypertension, melanoma.  She was admitted with septic shock requiring pressors and ICU management with right lower extremity cellulitis.  Patient was briefly on pressors and able to come off pressors with good blood pressure.  Patient transferred to the medical service on 11/02/2021.  Patient also found to have a Klebsiella UTI.  Assessment and Plan: Assessment and Plan: * Septic shock ([place]) Met criteria for septic shock on admission given leukocytosis, tachypnea and persistent hypotension requiring pressor support.  Present on admission secondary to right lower extremity cellulitis.  Patient with leukocytosis, hypotension and tachypnea.  So far blood cultures negative.  Patient was briefly was on Levophed.  Blood pressure stable and patient able to be transferred out of the ICU to the floor.  Change vancomycin and cefepime over to Ancef.  Likely changed to p.o. Keflex in the morning   Acute cystitis with hematuria Follow-up urine culture.  Received cefepime on admission we will change over to Ancef.  Given new stability, changed to p.o. Keflex.  Urine cultures positive for 50,000 colonies of Klebsiella near pansensitive except for penicillin  Cellulitis of right lower extremity Change antibiotics over to Ancef.  Still with warmth and redness of the right lower extremity.  Chronic diastolic CHF (congestive heart failure) (HCC) Patient with history of grade 2 diastolic dysfunction and has been getting fluids and pressor support since admission with antihypertensives held.  Now that she has stabilized, restart home p.o. medications and checking BNP to ensure she does not need diuresis.  CKD (chronic kidney disease),  stage III (HCC) CKD 3A with creatinine of 1.04 today GFR 49.  Elevated troponin Likely secondary to septic shock.  Not having any chest pain or shortness of breath.  Obtain echo.  Pressure injury of skin Pressure Injury 11/02/21 Coccyx Medial Stage 1 -  Intact skin with non-blanchable redness of a localized area usually over a bony prominence. (Active)  11/02/21 1629  Location: Coccyx  Location Orientation: Medial  Staging: Stage 1 -  Intact skin with non-blanchable redness of a localized area usually over a bony prominence.  Wound Description (Comments):   Present on Admission:     Stage I coccyx ulcer.  Present on admission  Hypomagnesemia Replace the magnesium IV today  Hypokalemia Replace potassium  Anxiety Continue Zoloft  Neuropathy On gabapentin at night  GERD (gastroesophageal reflux disease) Continue PPI  Obesity (BMI 30-39.9) Meets criteria BMI greater than 30       Body mass index is 30.87 kg/m.    Pressure Injury 11/02/21 Coccyx Medial Stage 1 -  Intact skin with non-blanchable redness of a localized area usually over a bony prominence. (Active)  11/02/21 1629  Location: Coccyx  Location Orientation: Medial  Staging: Stage 1 -  Intact skin with non-blanchable redness of a localized area usually over a bony prominence.  Wound Description (Comments):   Present on Admission:   Dressing Type Foam - Lift dressing to assess site every shift 11/03/21 1000     Consultants: Critical care  Procedures: Echocardiogram pending Pressor support  Antimicrobials: IV vancomycin 6/12 - 6/13 IV Ancef 6/13 - 6/15  Code Status: DNR   Subjective: Patient resting comfortably  Objective: Blood pressure trending upward Vitals:   11/03/21 0812 11/03/21  1550  BP: (!) 154/61 (!) 155/53  Pulse: 93 93  Resp: 18 18  Temp: 98.4 F (36.9 C) 98.4 F (36.9 C)  SpO2: 93% 96%    Intake/Output Summary (Last 24 hours) at 11/03/2021 1731 Last data filed at  11/03/2021 0752 Gross per 24 hour  Intake 120 ml  Output 600 ml  Net -480 ml   Filed Weights   11/01/21 0959 11/01/21 1725  Weight: 71.7 kg 71.7 kg   Body mass index is 30.87 kg/m.  Exam:  General: Resting comfortably, no acute distress HEENT: Normocephalic, atraumatic, mucous membranes are moist Cardiovascular: Regular rate and rhythm, S1-S2 Respiratory: Clear to auscultation bilaterally Abdomen: Soft, nontender, nondistended, positive bowel sounds Musculoskeletal: No clubbing or cyanosis, trace pitting edema Skin: Minimal erythema on lower extremity Psychiatry: Appropriate, no evidence of psychoses Neurology: No focal deficits  Data Reviewed: Potassium 3.4  Disposition:  Status is: Inpatient Remains inpatient appropriate because: Continued need for IV antibiotics    Anticipated discharge date: 6/15  Family Communication: Left message for family DVT Prophylaxis: enoxaparin (LOVENOX) injection 40 mg Start: 11/03/21 2200 SCDs Start: 11/01/21 1548    Author: Annita Brod ,MD 11/03/2021 5:31 PM  To reach On-call, see care teams to locate the attending and reach out via [email or web address]. Between 7PM-7AM, please contact night-coverage If you still have difficulty reaching the attending provider, please page the [facility] (Director on Call) for [facility] on amion for assistance.

## 2021-11-03 NOTE — TOC Initial Note (Signed)
Transition of Care [facility]) - Initial/Assessment Note    Patient Details  Name: Cindy Graham MRN: [id number] Date of Birth: Jan 29, 1922  [facility] CM/SW Contact:    Eileen Stanford, LCSW Phone Number: 11/03/2021, 4:20 PM  Clinical Narrative:         CSW spoke with pt and [name] present at bedside. They confirmed pt will return to [facility] at d/c and will continue therapy with [place]. [place] with [place] confirmed pt is active (just PT). Pt will go back via EMS per pt's daughter.           Expected Discharge Plan: Assisted Living Barriers to Discharge: Continued Medical Work up   Patient Goals and CMS Choice Patient states their goals for this hospitalization and ongoing recovery are:: to return to [place] house      Expected Discharge Plan and Services Expected Discharge Plan: Assisted Living In-house Referral: Clinical Social Work   Post Acute Care Choice: [place] arrangements for the past 2 months: Assisted Living Facility                           HH Arranged: PT, OT [facility] Agency: [place] Date [place]: 11/03/21 Time [place]: 9 Representative spoke with at [place]: [place]  Prior Living Arrangements/Services Living arrangements for the past 2 months: [place] Lives with:: Self Patient language and need for interpreter reviewed:: Yes Do you feel safe going back to the place where you live?: Yes      Need for Family Participation in Patient Care: Yes (Comment) Care giver support system in place?: Yes (comment)   Criminal Activity/Legal Involvement Pertinent to Current Situation/Hospitalization: No - Comment as needed  Activities of Daily Living Home Assistive Devices/Equipment: Dentures (specify type), Reacher, Built-in shower seat, Grab bars around toilet, Bedside commode/3-in-1, Eyeglasses, Grab bars in shower, Wheelchair ADL Screening (condition at time of  admission) Patient's cognitive ability adequate to safely complete daily activities?: Yes Is the patient deaf or have difficulty hearing?: Yes Does the patient have difficulty seeing, even when wearing glasses/contacts?: Yes Does the patient have difficulty concentrating, remembering, or making decisions?: No Patient able to express need for assistance with ADLs?: Yes Does the patient have difficulty dressing or bathing?: Yes Independently performs ADLs?: No Communication: Independent Dressing (OT): Dependent Is this a change from baseline?: Pre-admission baseline Grooming: Dependent Feeding: Independent Bathing: Dependent Is this a change from baseline?: Pre-admission baseline Toileting: Independent with device (comment) In/Out Bed: Independent with device (comment) Walks in Home: Dependent Is this a change from baseline?: Pre-admission baseline Does the patient have difficulty walking or climbing stairs?: Yes Weakness of Legs: Both Weakness of Arms/Hands: Both  Permission Sought/Granted Permission sought to share information with : Family Supports Permission granted to share information with : Yes, Verbal Permission Granted  Share Information with NAME: Gwinda Passe  Permission granted to share info w AGENCY: [place] granted to share info w Relationship: daughter     Emotional Assessment Appearance:: Appears stated age Attitude/Demeanor/Rapport: Engaged Affect (typically observed): Accepting Orientation: : Oriented to Self, Oriented to Place, Oriented to  Time, Oriented to Situation Alcohol / Substance Use: Not Applicable Psych Involvement: No (comment)  Admission diagnosis:  Cellulitis of right lower extremity [L03.115] Septic shock (HCC) [A41.9, R65.21] Patient Active Problem List   Diagnosis Date Noted   Pressure injury of skin 11/03/2021   Cellulitis of right lower  extremity 11/02/2021   Acute cystitis with hematuria 11/02/2021   Anxiety 11/02/2021    GERD (gastroesophageal reflux disease) 11/02/2021   Neuropathy 11/02/2021   Hypokalemia 11/02/2021   Hypomagnesemia 11/02/2021   CKD (chronic kidney disease), stage III ([place]) 11/02/2021   Septic shock ([place]) 11/01/2021   Tachycardia    Sepsis ([place]) 03/17/2020   Severe sepsis ([place]) 03/16/2020   COPD (chronic obstructive pulmonary disease) ([place])    Elevated troponin    Displaced intertrochanteric fracture of right femur, initial encounter for closed fracture ([place])    Acute kidney injury (AKI) with acute tubular necrosis (ATN) ([place]) 02/27/2019   Stroke (cerebrum) ([place]) 02/22/2019   Cellulitis 06/12/2018   Hyponatremia 11/24/2017   Headache 11/24/2017   Wound of left leg 11/01/2017   Constipation 09/05/2017   Other fatigue 12/22/2016   Tremor 03/29/2016   Amaurosis fugax 12/25/2015   Asthma 09/17/2015   PVD (peripheral vascular disease) ([place]) 05/28/2015   B12 deficiency 08/29/2014   Chronic back pain greater than 3 months duration 04/23/2013   Acute renal failure superimposed on stage 3a chronic kidney disease ([place]) 04/12/2012   Hyperlipidemia LDL goal < 100 03/05/2012   UTI (urinary tract infection) 07/26/2011   Osteoarthritis 07/06/2011   Hypertension 02/21/2011   Depression/Anxiety 02/21/2011   PCP:  Orvis Brill, [facility]:   [facility] [place], [place] [address] Phone: [phone] Fax: [place], [place] - [address] [address] Phone: [phone] Fax: [place], [place] [place] [address] Phone: [phone] Fax: [place], [place] [address] [place] [address] Phone: [phone] Fax: [phone]     Social Determinants of Health (SDOH) Interventions    Readmission  Risk Interventions     No data to display

## 2021-11-03 NOTE — TOC Progression Note (Signed)
Transition of Care [facility]) - Progression Note    Patient Details  Name: Cindy Graham MRN: [id number] Date of Birth: 1921-11-24  Transition of Care [facility]) CM/SW [place], LCSW Phone Number: 11/03/2021, 4:14 PM  Clinical Narrative: Pt is from [facility] and gets therapy through [facility].            Expected Discharge Plan and Services                                                 Social Determinants of Health (SDOH) Interventions    Readmission Risk Interventions     No data to display

## 2021-11-03 NOTE — Evaluation (Signed)
Occupational Therapy Evaluation Patient Details Name: Cindy Graham MRN: [id number] DOB: January 11, 1922 Today's Date: 11/03/2021   History of Present Illness pt is a 88 year old femal admitted with septic shock with RLE cellulitis, requring pressors and ICU management.  PMH significant for anxiety, arthritis, asthma/COPD, GERD, hyperlipidemia, hypertension, melanoma.   Clinical Impression   Chart reviewed, Rn cleared pt for participation in OT evaluation. Pt reports she lives at [facility], presents as alert and oriented x4. Pt reports she SPT to [facility] at baseline, has assist with ADL/IADl as needed but generally is able to perform UB dressing with SET UP, LB dressing with intermittent assist. Pt presents with deficits in strength, endurance affecting safe and optimal ADL completion. Recommend discharge back to long term care facility and rehab in house; if pt is unable to receive therapy at LTC, pt may benefit from discharge to STR to address deficits. OT will continue to follow acutely.      Recommendations for follow up therapy are one component of a multi-disciplinary discharge planning process, led by the attending physician.  Recommendations may be updated based on patient status, additional functional criteria and insurance authorization.   Follow Up Recommendations  Home health OT    Assistance Recommended at Discharge Frequent or constant Supervision/Assistance  Patient can return home with the following A lot of help with walking and/or transfers;A lot of help with bathing/dressing/bathroom;Assistance with cooking/housework    Functional Status Assessment  Patient has had a recent decline in their functional status and demonstrates the ability to make significant improvements in function in a reasonable and predictable amount of time.  Equipment Recommendations  None recommended by OT    Recommendations for Other Services       Precautions / Restrictions  Precautions Precautions: Fall Restrictions Weight Bearing Restrictions: No      Mobility Bed Mobility Overal bed mobility: Needs Assistance Bed Mobility: Supine to Sit     Supine to sit: Min guard, HOB elevated          Transfers Overall transfer level: Needs assistance Equipment used: 1 person hand held assist Transfers: Sit to/from Stand Sit to Stand: Min assist Stand pivot transfers: Min assist                Balance Overall balance assessment: Needs assistance Sitting-balance support: Feet supported Sitting balance-Leahy Scale: Fair     Standing balance support: Bilateral upper extremity supported Standing balance-Leahy Scale: Poor                             ADL either performed or assessed with clinical judgement   ADL Overall ADL's : Needs assistance/impaired Eating/Feeding: Set up Eating/Feeding Details (indicate cue type and reason): cut food             Upper Body Dressing : Minimal assistance   Lower Body Dressing: Maximal assistance   Toilet Transfer: Minimal assistance;Stand-pivot Toilet Transfer Details (indicate cue type and reason): hand held assist simulated to bedside chair                 Vision Patient Visual Report: No change from baseline       Perception     Praxis      Pertinent Vitals/Pain Pain Assessment Pain Assessment: No/denies pain     Hand Dominance     Extremity/Trunk Assessment Upper Extremity Assessment Upper Extremity Assessment: Generalized weakness   Lower Extremity Assessment Lower Extremity Assessment:  Generalized weakness   Cervical / Trunk Assessment Cervical / Trunk Assessment: Kyphotic (S curve scoli thoracic to the R, lumbar to the L)   Communication Communication Communication: HOH   Cognition Arousal/Alertness: Awake/alert Behavior During Therapy: WFL for tasks assessed/performed Overall Cognitive Status: Within Functional Limits for tasks assessed                                        General Comments       Exercises     Shoulder Instructions      Home Living Family/patient expects to be discharged to:: Assisted living                                 Additional Comments: LTC resident at [facility]      Prior Functioning/Environment Prior Level of Function : Needs assist       Physical Assist : Mobility (physical);ADLs (physical) Mobility (physical): Transfers ADLs (physical): Grooming;Bathing;Dressing;Toileting;IADLs Mobility Comments: pt reports at least supervision for spt to mwc ADLs Comments: pt reports supervision with UB dressing, assist with LB dressing, assist required for bathing; Assist required for IADLs (cooking, cleaning). Pt is able to feed self with SET UP.        OT Problem List: Decreased strength;Decreased activity tolerance      OT Treatment/Interventions: Self-care/ADL training;Patient/family education;DME and/or AE instruction;Therapeutic activities;Therapeutic exercise    OT Goals(Current goals can be found in the care plan section) Acute Rehab OT Goals Patient Stated Goal: back to LTC facility OT Goal Formulation: With patient Time For Goal Achievement: 11/17/21 Potential to Achieve Goals: Good ADL Goals Pt Will Perform Upper Body Dressing: with supervision Pt Will Perform Lower Body Dressing: with min assist Pt Will Transfer to Toilet: with supervision;stand pivot transfer  OT Frequency: Min 2X/week    Co-evaluation              AM-PAC OT "6 Clicks" Daily Activity     Outcome Measure Help from another person eating meals?: None Help from another person taking care of personal grooming?: A Little Help from another person toileting, which includes using toliet, bedpan, or urinal?: A Lot Help from another person bathing (including washing, rinsing, drying)?: A Lot Help from another person to put on and taking off regular upper body clothing?: A Little Help from  another person to put on and taking off regular lower body clothing?: A Lot 6 Click Score: 16   End of Session Nurse Communication: Mobility status  Activity Tolerance: Patient tolerated treatment well Patient left: in chair;with call bell/phone within reach;with chair alarm set  OT Visit Diagnosis: Unsteadiness on feet (R26.81)                Time: 3154-0086 OT Time Calculation (min): 19 min Charges:  OT General Charges $OT Visit: 1 Visit OT Evaluation $OT Eval Moderate Complexity: 1 Mod  Shanon Payor, OTD OTR/L  11/03/21, 1:27 PM

## 2021-11-03 NOTE — Assessment & Plan Note (Signed)
Meets criteria BMI greater than 30 

## 2021-11-03 NOTE — Plan of Care (Signed)
  Problem: Education: Goal: Knowledge of General Education information will improve Description: Including pain rating scale, medication(s)/side effects and non-pharmacologic comfort measures 11/03/2021 0130 by Fatima Blank, RN Outcome: Progressing 11/03/2021 0130 by Fatima Blank, RN Outcome: Progressing   Problem: Health Behavior/Discharge Planning: Goal: Ability to manage health-related needs will improve 11/03/2021 0130 by Fatima Blank, RN Outcome: Progressing 11/03/2021 0130 by Fatima Blank, RN Outcome: Progressing   Problem: Clinical Measurements: Goal: Ability to maintain clinical measurements within normal limits will improve 11/03/2021 0130 by Fatima Blank, RN Outcome: Progressing 11/03/2021 0130 by Fatima Blank, RN Outcome: Progressing Goal: Will remain free from infection 11/03/2021 0130 by Fatima Blank, RN Outcome: Progressing 11/03/2021 0130 by Fatima Blank, RN Outcome: Progressing Goal: Diagnostic test results will improve 11/03/2021 0130 by Fatima Blank, RN Outcome: Progressing 11/03/2021 0130 by Fatima Blank, RN Outcome: Progressing Goal: Respiratory complications will improve 11/03/2021 0130 by Fatima Blank, RN Outcome: Progressing 11/03/2021 0130 by Fatima Blank, RN Outcome: Progressing Goal: Cardiovascular complication will be avoided 11/03/2021 0130 by Fatima Blank, RN Outcome: Progressing 11/03/2021 0130 by Fatima Blank, RN Outcome: Progressing   Problem: Activity: Goal: Risk for activity intolerance will decrease 11/03/2021 0130 by Fatima Blank, RN Outcome: Progressing 11/03/2021 0130 by Fatima Blank, RN Outcome: Progressing   Problem: Nutrition: Goal: Adequate nutrition will be maintained 11/03/2021 0130 by Fatima Blank, RN Outcome: Progressing 11/03/2021 0130 by Fatima Blank, RN Outcome:  Progressing   Problem: Coping: Goal: Level of anxiety will decrease Outcome: Progressing   Problem: Elimination: Goal: Will not experience complications related to bowel motility Outcome: Progressing Goal: Will not experience complications related to urinary retention Outcome: Progressing   Problem: Pain Managment: Goal: General experience of comfort will improve Outcome: Progressing   Problem: Safety: Goal: Ability to remain free from injury will improve Outcome: Progressing   Problem: Skin Integrity: Goal: Risk for impaired skin integrity will decrease Outcome: Progressing

## 2021-11-04 DIAGNOSIS — E669 Obesity, unspecified: Secondary | ICD-10-CM | POA: Diagnosis not present

## 2021-11-04 DIAGNOSIS — I5032 Chronic diastolic (congestive) heart failure: Secondary | ICD-10-CM | POA: Diagnosis not present

## 2021-11-04 DIAGNOSIS — N3001 Acute cystitis with hematuria: Secondary | ICD-10-CM | POA: Diagnosis not present

## 2021-11-04 DIAGNOSIS — L03115 Cellulitis of right lower limb: Secondary | ICD-10-CM | POA: Diagnosis not present

## 2021-11-04 LAB — ECHOCARDIOGRAM COMPLETE
Area-P 1/2: 3.53 cm2
Height: 60 in
P 1/2 time: 431 msec
S' Lateral: 2.12 cm
Weight: 2529.12 oz

## 2021-11-04 LAB — BASIC METABOLIC PANEL
Anion gap: 5 (ref 5–15)
BUN: 24 mg/dL — ABNORMAL HIGH (ref 8–23)
CO2: 25 mmol/L (ref 22–32)
Calcium: 7.8 mg/dL — ABNORMAL LOW (ref 8.9–10.3)
Chloride: 108 mmol/L (ref 98–111)
Creatinine, Ser: 0.84 mg/dL (ref 0.44–1.00)
GFR, Estimated: >60 mL/min (ref >60)
Glucose, Bld: 103 mg/dL — ABNORMAL HIGH (ref 70–99)
Potassium: 3.4 mmol/L — ABNORMAL LOW (ref 3.5–5.1)
Sodium: 138 mmol/L (ref 135–145)

## 2021-11-04 LAB — CBC
HCT: 31.1 % — ABNORMAL LOW (ref 36.0–46.0)
Hemoglobin: 10.1 g/dL — ABNORMAL LOW (ref 12.0–15.0)
MCH: 30.3 pg (ref 26.0–34.0)
MCHC: 32.5 g/dL (ref 30.0–36.0)
MCV: 93.4 fL (ref 80.0–100.0)
Platelets: 229 10*3/uL (ref 150–400)
RBC: 3.33 MIL/uL — ABNORMAL LOW (ref 3.87–5.11)
RDW: 15.2 % (ref 11.5–15.5)
WBC: 10 10*3/uL (ref 4.0–10.5)
nRBC: 0 % (ref 0.0–0.2)

## 2021-11-04 LAB — BRAIN NATRIURETIC PEPTIDE: B Natriuretic Peptide: 273.3 pg/mL — ABNORMAL HIGH (ref 0.0–100.0)

## 2021-11-04 MED ORDER — CEPHALEXIN 500 MG PO CAPS
500.0000 mg | ORAL_CAPSULE | Freq: Four times a day (QID) | ORAL | Status: DC
  Administered 2021-11-04 – 2021-11-05 (×3): 500 mg via ORAL
  Filled 2021-11-04 (×3): qty 1

## 2021-11-04 MED ORDER — FUROSEMIDE 10 MG/ML IJ SOLN
20.0000 mg | Freq: Two times a day (BID) | INTRAMUSCULAR | Status: DC
  Administered 2021-11-04 – 2021-11-05 (×3): 20 mg via INTRAVENOUS
  Filled 2021-11-04 (×3): qty 4

## 2021-11-04 NOTE — Progress Notes (Signed)
Occupational Therapy Treatment Patient Details Name: Cindy Graham MRN: [id number] DOB: 07/11/1921 Today's Date: 11/04/2021   History of present illness pt is a 88 year old femal admitted with septic shock with RLE cellulitis, requring pressors and ICU management.  PMH significant for anxiety, arthritis, asthma/COPD, GERD, hyperlipidemia, hypertension, melanoma.   OT comments  Chart reviewed to date, pt greeted in room with pt reporting she required assist following BM. STS completed with MIN A, peri care completed with MAX A. Pt then transfer to [facility] with HHA with MIN A. Additional SPT to bed completed with MIN A via HHA. SET UP required for grooming tasks. Pt is making progress towards goals, discharge recommendation remains appropriate. OT will continue to follow acutely.   Recommendations for follow up therapy are one component of a multi-disciplinary discharge planning process, led by the attending physician.  Recommendations may be updated based on patient status, additional functional criteria and insurance authorization.    Follow Up Recommendations  Home health OT    Assistance Recommended at Discharge Frequent or constant Supervision/Assistance  Patient can return home with the following  A lot of help with walking and/or transfers;A lot of help with bathing/dressing/bathroom;Assistance with cooking/housework   Equipment Recommendations  None recommended by OT    Recommendations for Other Services      Precautions / Restrictions Precautions Precautions: Fall Restrictions Weight Bearing Restrictions: No       Mobility Bed Mobility Overal bed mobility: Needs Assistance Bed Mobility: Sit to Supine       Sit to supine: Min assist, HOB elevated        Transfers Overall transfer level: Needs assistance Equipment used: 1 person hand held assist Transfers: Sit to/from Stand Sit to Stand: Min assist Stand pivot transfers: Min assist               Balance  Overall balance assessment: Needs assistance Sitting-balance support: Feet supported Sitting balance-Leahy Scale: Fair     Standing balance support: Single extremity supported Standing balance-Leahy Scale: Poor                             ADL either performed or assessed with clinical judgement   ADL Overall ADL's : Needs assistance/impaired                                       General ADL Comments: MIN A + HHA with multimodal cues for safe hand placement and sequencing for stand pivot t/f from recliner to BSC>BSC to bed. MAX A for pericare in standing.    Extremity/Trunk Assessment              Vision       Perception     Praxis      Cognition Arousal/Alertness: Awake/alert Behavior During Therapy: WFL for tasks assessed/performed Overall Cognitive Status: Within Functional Limits for tasks assessed                                          Exercises      Shoulder Instructions       General Comments      Pertinent Vitals/ Pain       Pain Assessment Pain Assessment: No/denies pain  Home Living  Prior Functioning/Environment              Frequency  Min 2X/week        Progress Toward Goals  OT Goals(current goals can now be found in the care plan section)  Progress towards OT goals: Progressing toward goals     Plan Discharge plan remains appropriate    Co-evaluation                 AM-PAC OT "6 Clicks" Daily Activity     Outcome Measure   Help from another person eating meals?: None Help from another person taking care of personal grooming?: None Help from another person toileting, which includes using toliet, bedpan, or urinal?: A Lot Help from another person bathing (including washing, rinsing, drying)?: A Lot Help from another person to put on and taking off regular upper body clothing?: A Little Help from another person  to put on and taking off regular lower body clothing?: A Lot 6 Click Score: 17    End of Session    OT Visit Diagnosis: Unsteadiness on feet (R26.81)   Activity Tolerance Patient tolerated treatment well   Patient Left in bed;with bed alarm set   Nurse Communication Mobility status        Time: 1859-0931 OT Time Calculation (min): 36 min  Charges: OT General Charges $OT Visit: 1 Visit OT Treatments $Self Care/Home Management : 23-37 mins Shanon Payor, OTD OTR/L  11/04/21, 3:49 PM

## 2021-11-04 NOTE — Progress Notes (Signed)
[facility] Progress Note  Patient: Cindy Graham    XTK:[id number]  DOA: 11/01/2021    Date of Service: the patient was seen and examined on 11/04/2021  Brief hospital course: 88 year old female with past medical history of anxiety, arthritis, asthma/COPD, GERD, hyperlipidemia, hypertension, melanoma.  She was admitted with septic shock requiring pressors and ICU management with right lower extremity cellulitis.  Patient was briefly on pressors and able to come off pressors with good blood pressure.  Patient transferred to the medical service on 11/02/2021.  Patient also found to have a Klebsiella UTI.  Assessment and Plan: Assessment and Plan: * Septic shock ([place]) Met criteria for septic shock on admission given leukocytosis, tachypnea and persistent hypotension requiring pressor support.  Present on admission secondary to right lower extremity cellulitis.  Patient with leukocytosis, hypotension and tachypnea.  So far blood cultures negative.  Patient was briefly was on Levophed.  Blood pressure stable and patient able to be transferred out of the ICU to the floor.  Change vancomycin and cefepime over to Ancef.  Changed to Keflex 6/15 1 white count normalized.  Total of 7 days of antibiotics planned  Acute cystitis with hematuria Follow-up urine culture.  Received cefepime on admission we will change over to Ancef.  Given new stability, changed to p.o. Keflex.  Urine cultures positive for 50,000 colonies of Klebsiella near pansensitive except for penicillin  Cellulitis of right lower extremity Antibiotics changed to Ancef on 6/14.  Still with warmth and redness of the right lower extremity.  With normalized white count, change to p.o. Keflex  Chronic diastolic CHF (congestive heart failure) ([place]) Patient with history of grade 2 diastolic dysfunction and has been getting fluids and pressor support since admission with antihypertensives held.  BNP on 6/15 mildly elevated at 270.  Ordered  low-dose Lasix.  Echocardiogram on 6/13 notes continued grade 2 diastolic dysfunction  CKD (chronic kidney disease), stage III (HCC) Creatinine for several days is actually been better than baseline, under 1 with GFR greater than 60.  Expect some increase with starting Lasix  Elevated troponin Likely secondary to septic shock.  Not having any chest pain or shortness of breath.  Echocardiogram notes grade 2 diastolic dysfunction  Pressure injury of skin Pressure Injury 11/02/21 Coccyx Medial Stage 1 -  Intact skin with non-blanchable redness of a localized area usually over a bony prominence. (Active)  11/02/21 1629  Location: Coccyx  Location Orientation: Medial  Staging: Stage 1 -  Intact skin with non-blanchable redness of a localized area usually over a bony prominence.  Wound Description (Comments):   Present on Admission:     Stage I coccyx ulcer.  Present on admission  Hypomagnesemia Replace the magnesium IV today  Hypokalemia Replace potassium  Anxiety Continue Zoloft  Neuropathy On gabapentin at night  GERD (gastroesophageal reflux disease) Continue PPI  Obesity (BMI 30-39.9) Meets criteria BMI greater than 30       Body mass index is 30.87 kg/m.         Consultants: Critical care  Procedures: Echocardiogram notes grade 2 diastolic dysfunction and preserved ejection fraction Pressor support  Antimicrobials: IV vancomycin 6/12 - 6/13 IV Ancef 6/13 - 6/15  Code Status: DNR   Subjective: Patient with no complaints, resting comfortably  Objective: Blood pressure trending upward Vitals:   11/04/21 1321 11/04/21 1735  BP: (!) 175/62 (!) 179/64  Pulse: 90 84  Resp: 16 15  Temp: 97.9 F (36.6 C) 98.3 F (36.8 C)  SpO2: 97%  94%    Intake/Output Summary (Last 24 hours) at 11/04/2021 1744 Last data filed at 11/04/2021 0832 Gross per 24 hour  Intake 100 ml  Output 200 ml  Net -100 ml    Filed Weights   11/01/21 0959 11/01/21 1725   Weight: 71.7 kg 71.7 kg   Body mass index is 30.87 kg/m.  Exam:  General: Resting comfortably, no acute distress HEENT: Normocephalic, atraumatic, mucous membranes are moist Cardiovascular: Regular rate and rhythm, S1-S2 Respiratory: Clear to auscultation bilaterally Abdomen: Soft, nontender, nondistended, positive bowel sounds Musculoskeletal: No clubbing or cyanosis, trace pitting edema Skin: Improving erythema on lower extremity Psychiatry: Appropriate, no evidence of psychoses Neurology: No focal deficits  Data Reviewed: Potassium 3.4.  BNP of 273  Disposition:  Status is: Inpatient Remains inpatient appropriate because: Diuresing, plan to discharge back to assisted living on 6/16 with home health    Anticipated discharge date: 6/ 16  Family Communication: Updated son and daughter at the bedside DVT Prophylaxis: enoxaparin (LOVENOX) injection 40 mg Start: 11/03/21 2200 SCDs Start: 11/01/21 1548    Author: Annita Brod ,MD 11/04/2021 5:44 PM  To reach On-call, see care teams to locate the attending and reach out via [email or web address]. Between 7PM-7AM, please contact night-coverage If you still have difficulty reaching the attending provider, please page the [facility] (Director on Call) for [facility] on amion for assistance.

## 2021-11-04 NOTE — Progress Notes (Signed)
Physical Therapy Treatment Patient Details Name: MARISSA WEAVER MRN: [id number] DOB: [birth year] Today's Date: 11/04/2021   History of Present Illness pt is a 88 year old femal admitted with septic shock with RLE cellulitis, requring pressors and ICU management.  PMH significant for anxiety, arthritis, asthma/COPD, GERD, hyperlipidemia, hypertension, melanoma.    PT Comments    Pt was pleasant and motivated to participate during the session and put forth good effort throughout. Pt was able to complete seated therex w/ manual resistance w/ no increases in pain and good LE activation. Pt was repositioned in recliner and performed sit to stand w/ modA to complete and for steadying. Recommendation at this time is to return to LTC with PT follow to maximize function and independence. May need to consider SNF if patient does not have adequate support at LTC due to decline in functional mobility.    Recommendations for follow up therapy are one component of a multi-disciplinary discharge planning process, led by the attending physician.  Recommendations may be updated based on patient status, additional functional criteria and insurance authorization.  Follow Up Recommendations  Other (comment)     Assistance Recommended at Discharge Frequent or constant Supervision/Assistance  Patient can return home with the following A little help with walking and/or transfers;A little help with bathing/dressing/bathroom;Assistance with cooking/housework;Assist for transportation;Help with stairs or ramp for entrance;Direct supervision/assist for medications management   Equipment Recommendations       Recommendations for Other Services       Precautions / Restrictions Precautions Precautions: Fall Restrictions Weight Bearing Restrictions: No     Mobility  Bed Mobility               General bed mobility comments: pt in recliner at start/end of session    Transfers Overall transfer level:  Needs assistance Equipment used: 1 person hand held assist Transfers: Sit to/from Stand Sit to Stand: Mod assist           General transfer comment: modA to initiate stand and for steadying    Ambulation/Gait               General Gait Details: deferred, pt does not ambulate at baseline (stated she previously was working with PT and was trying to walk then)   Marine scientist [place] (Stroke Patients Only)       Balance Overall balance assessment: Needs assistance Sitting-balance support: Feet supported Sitting balance-Leahy Scale: Fair     Standing balance support: Bilateral upper extremity supported Standing balance-Leahy Scale: Poor                              Cognition Arousal/Alertness: Awake/alert Behavior During Therapy: WFL for tasks assessed/performed Overall Cognitive Status: Within Functional Limits for tasks assessed                                          Exercises General Exercises - Lower Extremity Long Arc Quad: Strengthening, Both, 10 reps (manual resistance) Heel Slides: Strengthening, Both, 10 reps (manual resistance) Hip Flexion/Marching: Seated, Strengthening, Both, 10 reps (manual resistance)    General Comments        Pertinent Vitals/Pain Pain Assessment Pain Assessment: No/denies pain    Home Living  Prior Function            PT Goals (current goals can now be found in the care plan section) Progress towards PT goals: Progressing toward goals    Frequency    Min 2X/week      PT Plan Current plan remains appropriate    Co-evaluation              AM-PAC PT "6 Clicks" Mobility   Outcome Measure  Help needed turning from your back to your side while in a flat bed without using bedrails?: A Little Help needed moving from lying on your back to sitting on the side of a flat bed without using bedrails?:  A Little Help needed moving to and from a bed to a chair (including a wheelchair)?: A Little Help needed standing up from a chair using your arms (e.g., wheelchair or bedside chair)?: A Little Help needed to walk in hospital room?: Total Help needed climbing 3-5 steps with a railing? : Total 6 Click Score: 14    End of Session   Activity Tolerance: Patient tolerated treatment well Patient left: with call bell/phone within reach;in chair;with chair alarm set Nurse Communication: Mobility status PT Visit Diagnosis: Other abnormalities of gait and mobility (R26.89);Difficulty in walking, not elsewhere classified (R26.2);Muscle weakness (generalized) (M62.81)     Time: 3419-3790 PT Time Calculation (min) (ACUTE ONLY): 13 min  Charges:                        Turner Daniels, SPT  11/04/2021, 11:55 AM

## 2021-11-04 NOTE — Plan of Care (Signed)

## 2021-11-05 DIAGNOSIS — I5033 Acute on chronic diastolic (congestive) heart failure: Secondary | ICD-10-CM | POA: Diagnosis not present

## 2021-11-05 DIAGNOSIS — A419 Sepsis, unspecified organism: Secondary | ICD-10-CM | POA: Diagnosis not present

## 2021-11-05 DIAGNOSIS — E669 Obesity, unspecified: Secondary | ICD-10-CM | POA: Diagnosis not present

## 2021-11-05 DIAGNOSIS — N3001 Acute cystitis with hematuria: Secondary | ICD-10-CM | POA: Diagnosis not present

## 2021-11-05 LAB — CBC
HCT: 32.1 % — ABNORMAL LOW (ref 36.0–46.0)
Hemoglobin: 10.2 g/dL — ABNORMAL LOW (ref 12.0–15.0)
MCH: 29.7 pg (ref 26.0–34.0)
MCHC: 31.8 g/dL (ref 30.0–36.0)
MCV: 93.3 fL (ref 80.0–100.0)
Platelets: 246 10*3/uL (ref 150–400)
RBC: 3.44 MIL/uL — ABNORMAL LOW (ref 3.87–5.11)
RDW: 15 % (ref 11.5–15.5)
WBC: 9 10*3/uL (ref 4.0–10.5)
nRBC: 0 % (ref 0.0–0.2)

## 2021-11-05 LAB — BASIC METABOLIC PANEL
Anion gap: 4 — ABNORMAL LOW (ref 5–15)
BUN: 16 mg/dL (ref 8–23)
CO2: 31 mmol/L (ref 22–32)
Calcium: 7.7 mg/dL — ABNORMAL LOW (ref 8.9–10.3)
Chloride: 105 mmol/L (ref 98–111)
Creatinine, Ser: 0.73 mg/dL (ref 0.44–1.00)
GFR, Estimated: >60 mL/min (ref >60)
Glucose, Bld: 101 mg/dL — ABNORMAL HIGH (ref 70–99)
Potassium: 2.9 mmol/L — ABNORMAL LOW (ref 3.5–5.1)
Sodium: 140 mmol/L (ref 135–145)

## 2021-11-05 LAB — MAGNESIUM: Magnesium: 1.7 mg/dL (ref 1.7–2.4)

## 2021-11-05 MED ORDER — POTASSIUM CHLORIDE CRYS ER 20 MEQ PO TBCR
40.0000 meq | EXTENDED_RELEASE_TABLET | ORAL | Status: AC
  Administered 2021-11-05 (×2): 40 meq via ORAL
  Filled 2021-11-05 (×2): qty 2

## 2021-11-05 MED ORDER — PROPRANOLOL HCL 40 MG PO TABS: 40.0000 mg | ORAL_TABLET | Freq: Two times a day (BID) | ORAL | Status: DC

## 2021-11-05 MED ORDER — CEPHALEXIN 500 MG PO CAPS: 500.0000 mg | ORAL_CAPSULE | Freq: Four times a day (QID) | ORAL | 0 refills | Status: DC

## 2021-11-05 NOTE — Progress Notes (Signed)
IV removed site c/d/I, pt was sent back to her ALF family transported her back to [facility] with all belongings

## 2021-11-05 NOTE — Care Management Important Message (Signed)
Important Message  Patient Details  Name: Cindy Graham MRN: [id number] Date of Birth: 1921-12-06   Medicare Important Message Given:  Yes  Patient signed the initial Important Message from Medicare with family in the room and understands her rights. I will send signed copy to Health Information Management to be scanned into patients medical record.  I wished her a speedy recovery.  Juliann Pulse A Kason Benak 11/05/2021, 12:01 PM

## 2021-11-05 NOTE — NC FL2 (Signed)
[place] LEVEL OF CARE SCREENING TOOL     IDENTIFICATION  Patient Name: Cindy Graham Birthdate: 08-Jul-1921 Sex: female Admission Date (Current Location): 11/01/2021  [facility] and [place] Number:  Engineering geologist and Address:  [facility], [address] [place], [place] [address]      Provider Number: [id number]  Attending Physician Name and Address:  No att. providers found  Relative Name and Phone Number:  Sherrell, Weir)   [phone] [facility])    Current Level of Care: Hospital Recommended Level of Care: Assisted Living Facility Prior Approval Number:    Date Approved/Denied:   PASRR Number:    Discharge Plan: Other (Comment)    Current Diagnoses: Patient Active Problem List   Diagnosis Date Noted   Pressure injury of skin 11/03/2021   Obesity (BMI 30-39.9) 11/03/2021   Acute on chronic diastolic CHF (congestive heart failure) ([place]) 11/03/2021   Cellulitis of right lower extremity 11/02/2021   Acute cystitis with hematuria 11/02/2021   Anxiety 11/02/2021   GERD (gastroesophageal reflux disease) 11/02/2021   Neuropathy 11/02/2021   Hypokalemia 11/02/2021   Hypomagnesemia 11/02/2021   CKD (chronic kidney disease), stage III ([place]) 11/02/2021   Septic shock ([place]) 11/01/2021   Tachycardia    Sepsis ([place]) 03/17/2020   Severe sepsis ([place]) 03/16/2020   COPD (chronic obstructive pulmonary disease) ([place])    Elevated troponin    Displaced intertrochanteric fracture of right femur, initial encounter for closed fracture ([place])    Acute kidney injury (AKI) with acute tubular necrosis (ATN) ([place]) 02/27/2019   Stroke (cerebrum) ([place]) 02/22/2019   Cellulitis 06/12/2018   Hyponatremia 11/24/2017   Headache 11/24/2017   Wound of left leg 11/01/2017   Constipation 09/05/2017   Other fatigue 12/22/2016   Tremor 03/29/2016   Amaurosis fugax 12/25/2015   Asthma 09/17/2015   PVD (peripheral vascular disease) ([place]) 05/28/2015    B12 deficiency 08/29/2014   Chronic back pain greater than 3 months duration 04/23/2013   Acute renal failure superimposed on stage 3a chronic kidney disease ([place]) 04/12/2012   Hyperlipidemia LDL goal < 100 03/05/2012   UTI (urinary tract infection) 07/26/2011   Osteoarthritis 07/06/2011   Hypertension 02/21/2011   Depression/Anxiety 02/21/2011    Orientation RESPIRATION BLADDER Height & Weight     Self, Time, Situation, Place  Normal Incontinent Weight: 158 lb 1.1 oz (71.7 kg) Height:  5' (152.4 cm)  BEHAVIORAL SYMPTOMS/MOOD NEUROLOGICAL BOWEL NUTRITION STATUS      Incontinent Diet (heart)  AMBULATORY STATUS COMMUNICATION OF NEEDS Skin   Limited Assist Verbally Bruising PU Stage 1 Dressing:  (Coccyx, foam dressing)                     Personal Care Assistance Level of Assistance  Bathing, Feeding, Dressing Bathing Assistance: Limited assistance Feeding assistance: Independent Dressing Assistance: Limited assistance     Functional Limitations Info  Sight, Hearing, Speech Sight Info: Adequate Hearing Info: Adequate Speech Info: Adequate    SPECIAL CARE FACTORS FREQUENCY  PT (By licensed PT), OT (By licensed OT)     PT Frequency: home health OT Frequency: home health            Contractures Contractures Info: Not present    Additional Factors Info  Code Status, Allergies Code Status Info: DNR Allergies Info: Statins, Tramadol, Levaquin (Levofloxacin In D5w), Neomycin-polymyxin-gramicidin, Penicillins, Sulfa Antibiotics, Tetracyclines & Related             TAKE these medications  acetaminophen 500 MG tablet Commonly known as: TYLENOL Take 500 mg by mouth every 6 (six) hours as needed for mild pain or moderate pain.    acetaminophen 325 MG tablet Commonly known as: TYLENOL Take 650 mg by mouth 2 (two) times daily.    alendronate 70 MG tablet Commonly known as: FOSAMAX Take 70 mg by mouth every 7 (seven) days. monday    alum & mag  hydroxide-simeth 200-200-20 MG/5ML suspension Commonly known as: MAALOX/MYLANTA Take 30 mLs by mouth 3 (three) times daily as needed for indigestion or heartburn.    aspirin 325 MG tablet Take 1 tablet (325 mg total) by mouth daily.    cephALEXin 500 MG capsule Commonly known as: KEFLEX Take 1 capsule (500 mg total) by mouth every 6 (six) hours.    Daily Vites tablet Take 1 tablet by mouth daily.    enalapril 20 MG tablet Commonly known as: VASOTEC Take 20 mg by mouth daily.    estradiol 0.1 MG/GM vaginal cream Commonly known as: ESTRACE Estrogen Cream Instruction Discard applicator Apply pea sized amount to tip of finger to urethra before bed. Wash hands well after application. Use Monday, Wednesday and Friday    feeding supplement Liqd Take 237 mLs by mouth 2 (two) times daily between meals.    Flonase Allergy Relief 50 MCG/ACT nasal spray Generic drug: fluticasone Place 2 sprays into both nostrils daily.    gabapentin 100 MG capsule Commonly known as: NEURONTIN Take 2 capsules by mouth at bedtime.    guaifenesin 100 MG/5ML syrup Commonly known as: ROBITUSSIN Take 200 mg by mouth every 6 (six) hours as needed for cough.    lactulose 10 GM/15ML solution Commonly known as: CHRONULAC Take 15 mLs by mouth daily as needed.    loperamide 2 MG capsule Commonly known as: IMODIUM Take 2 mg by mouth 4 (four) times daily as needed for diarrhea or loose stools.    magnesium hydroxide 400 MG/5ML suspension Commonly known as: MILK OF MAGNESIA Take 30 mLs by mouth daily as needed for mild constipation.    mirabegron ER 25 MG Tb24 tablet Commonly known as: MYRBETRIQ Take 1 tablet (25 mg total) by mouth daily.    nitroGLYCERIN 0.4 MG SL tablet Commonly known as: NITROSTAT Place 0.4 mg under the tongue every 5 (five) minutes as needed for chest pain.    nystatin cream Commonly known as: MYCOSTATIN Apply 1 application  topically as needed (panniculitis). Apply to pannus     omeprazole 20 MG capsule Commonly known as: PRILOSEC TAKE 1 CAPSULE BY MOUTH EVERY DAY.    polyethylene glycol 17 g packet Commonly known as: MIRALAX / GLYCOLAX Take 17 g by mouth daily.    Premarin vaginal cream Generic drug: conjugated estrogens Place 0.625 g vaginally. Every week on Monday, Wednesday and friday    propranolol 40 MG tablet Commonly known as: INDERAL Take 1 tablet (40 mg total) by mouth 2 (two) times daily.    psyllium 0.52 g capsule Commonly known as: REGULOID Take 0.52 g by mouth in the morning and at bedtime.    senna 8.6 MG Tabs tablet Commonly known as: SENOKOT Take 1 tablet by mouth at bedtime.    sertraline 100 MG tablet Commonly known as: ZOLOFT TAKE ONE TABLET BY MOUTH EVERY DAY    sodium chloride 0.65 % nasal spray Commonly known as: OCEAN Place 1 spray into the nose every 12 (twelve) hours as needed.    tapentadol 50 MG tablet Commonly known as: NUCYNTA  Take 50 mg by mouth 3 (three) times daily.    Ventolin HFA 108 (90 Base) MCG/ACT inhaler Generic drug: albuterol Inhale 2 puffs into the lungs every 4 (four) hours as needed.    verapamil 240 MG 24 hr capsule Commonly known as: VERELAN PM Take 240 mg by mouth at bedtime.       Relevant Imaging Results:  Relevant Lab Results:   Additional Information SS #: [id number]  [place], LCSW

## 2021-11-05 NOTE — TOC Transition Note (Signed)
Transition of Care [facility]) - CM/SW Discharge Note   Patient Details  Name: Cindy Graham MRN: [id number] Date of Birth: January 11, 1922  [facility] CM/SW Contact:  Magnus Ivan, LCSW Phone Number: 11/05/2021, 1:29 PM   Clinical Narrative:    Patient discharged back to [facility]. CSW called and spoke with Scotty at [facility] and sent secure email to [employer], contracting) with [employer] and DC Summary.  Attempted calls x 3 to [facility] main number, unable to reach and VM boxes were all full that CSW was transferred to. CSW was informed by LPN and MD that son already left with patient to transport her to [facility].  No additional needs.   Final next level of care: Assisted Living Barriers to Discharge: Barriers Resolved   Patient Goals and CMS Choice Patient states their goals for this hospitalization and ongoing recovery are:: to return to [facility]      Discharge Placement                Patient to be transferred to facility by: son      Discharge Plan and Services In-house Referral: Clinical Social Work   Post Acute Care Choice: Home Health                    HH Arranged: PT, OT [facility] Agency: [place] Date [place]: 11/03/21 Time [place]: 74 Representative spoke with at [place]: [place]  Social Determinants of Health ([place]) Interventions     Readmission Risk Interventions     No data to display

## 2021-11-05 NOTE — Discharge Summary (Signed)
Physician Discharge Summary   Patient: Cindy Graham MRN: [id number] DOB: February 15, 1922  Admit date:     11/01/2021  Discharge date: 11/05/21  Discharge Physician: Annita Brod   PCP: [facility]   Recommendations at discharge:   New medication: Keflex 500 mg every 6 hours x3 days. Patient returning back to assisted living facility with home health PT and OT  Discharge Diagnoses: Principal Problem:   Septic shock ([place]) Active Problems:   Acute cystitis with hematuria   Cellulitis of right lower extremity   Acute on chronic diastolic CHF (congestive heart failure) (HCC)   CKD (chronic kidney disease), stage III (HCC)   Elevated troponin   Hypomagnesemia   Pressure injury of skin   Anxiety   Hypokalemia   GERD (gastroesophageal reflux disease)   Neuropathy   Obesity (BMI 30-39.9)  Resolved Problems:   * No resolved hospital problems. *  Hospital Course: 88 year old female with past medical history of anxiety, arthritis, asthma/COPD, GERD, hyperlipidemia, hypertension, melanoma.  She was admitted with septic shock requiring pressors and ICU management with right lower extremity cellulitis.  Patient was briefly on pressors and able to come off pressors with good blood pressure.  Patient transferred to the medical service on 11/02/2021.  Patient also found to have a Klebsiella UTI.  Assessment and Plan: * Septic shock ([place]) Met criteria for septic shock on admission given leukocytosis, tachypnea and persistent hypotension requiring pressor support.  Present on admission secondary to right lower extremity cellulitis.  Patient with leukocytosis, hypotension and tachypnea.  So far blood cultures negative.  Patient was briefly was on Levophed.  Blood pressure stable and patient able to be transferred out of the ICU to the floor.  Change vancomycin and cefepime over to Ancef.  Changed to Keflex 6/15 after white count normalized.  And she will be discharged on this for 7  days of antibiotics total.  Acute cystitis with hematuria Follow-up urine culture.  Received cefepime on admission we will change over to Ancef.  Given new stability, changed to p.o. Keflex.  Urine cultures positive for 50,000 colonies of Klebsiella near pansensitive except for penicillin  Cellulitis of right lower extremity Antibiotics changed to Ancef on 6/14.  Still with warmth and redness of the right lower extremity.  With normalized white count, change to p.o. Keflex  Acute on chronic diastolic CHF (congestive heart failure) ([place]) Patient with history of grade 2 diastolic dysfunction and has been getting fluids and pressor support since admission with antihypertensives held.  BNP on 6/15 mildly elevated at 270.  Ordered low-dose Lasix.  Echocardiogram on 6/13 notes continued grade 2 diastolic dysfunction.  Prior to discharge, patient has diuresed almost 2.5 L and is almost -800 mL deficient.  CKD (chronic kidney disease), stage III (HCC) Creatinine for several days is actually been better than baseline, under 1 with GFR greater than 60.  On day of discharge, creatinine at 0.73 with GFR greater than 60  Elevated troponin Likely secondary to septic shock.  Not having any chest pain or shortness of breath.  Echocardiogram notes grade 2 diastolic dysfunction  Pressure injury of skin Pressure Injury 11/02/21 Coccyx Medial Stage 1 -  Intact skin with non-blanchable redness of a localized area usually over a bony prominence. (Active)  11/02/21 1629  Location: Coccyx  Location Orientation: Medial  Staging: Stage 1 -  Intact skin with non-blanchable redness of a localized area usually over a bony prominence.  Wound Description (Comments):   Present on Admission:  Stage I coccyx ulcer.  Present on admission  Hypomagnesemia Replaced with IV magnesium.  Hypokalemia Replace potassium  Anxiety Continue Zoloft  Neuropathy On gabapentin at night  GERD (gastroesophageal reflux  disease) Continue PPI  Obesity (BMI 30-39.9) Meets criteria BMI greater than 30         Consultants: Critical care Procedures performed: Echocardiogram noting grade 2 diastolic dysfunction Disposition: Assisted living with home health PT and OT Diet recommendation:  Discharge Diet Orders (From admission, onward)     Start     Ordered   11/05/21 0000  Diet - low sodium heart healthy        11/05/21 1020           Heart healthy diet with Ensure supplement twice daily between meals DISCHARGE MEDICATION: Allergies as of 11/05/2021       Reactions   Statins Other (See Comments)   Very bad muscle aches   Tramadol Itching   Levaquin [levofloxacin In D5w] Diarrhea, Other (See Comments)   Confusion, hallucinations   Neomycin-polymyxin-gramicidin Other (See Comments)   Penicillins    Tolerates cephalosporins Did it involve swelling of the face/tongue/throat, SOB, or low BP? Unknown Did it involve sudden or severe rash/hives, skin peeling, or any reaction on the inside of your mouth or nose? Unknown Did you need to seek medical attention at a hospital or doctor's office? Unknown When did it last happen? Unknown If all above answers are "NO", may proceed with cephalosporin use.   Sulfa Antibiotics    Tetracyclines & Related         Medication List     TAKE these medications    acetaminophen 500 MG tablet Commonly known as: TYLENOL Take 500 mg by mouth every 6 (six) hours as needed for mild pain or moderate pain.   acetaminophen 325 MG tablet Commonly known as: TYLENOL Take 650 mg by mouth 2 (two) times daily.   alendronate 70 MG tablet Commonly known as: FOSAMAX Take 70 mg by mouth every 7 (seven) days. monday   alum & mag hydroxide-simeth 200-200-20 MG/5ML suspension Commonly known as: MAALOX/MYLANTA Take 30 mLs by mouth 3 (three) times daily as needed for indigestion or heartburn.   aspirin 325 MG tablet Take 1 tablet (325 mg total) by mouth daily.    cephALEXin 500 MG capsule Commonly known as: KEFLEX Take 1 capsule (500 mg total) by mouth every 6 (six) hours.   Daily Vites tablet Take 1 tablet by mouth daily.   enalapril 20 MG tablet Commonly known as: VASOTEC Take 20 mg by mouth daily.   estradiol 0.1 MG/GM vaginal cream Commonly known as: ESTRACE Estrogen Cream Instruction Discard applicator Apply pea sized amount to tip of finger to urethra before bed. Wash hands well after application. Use Monday, Wednesday and Friday   feeding supplement Liqd Take 237 mLs by mouth 2 (two) times daily between meals.   Flonase Allergy Relief 50 MCG/ACT nasal spray Generic drug: fluticasone Place 2 sprays into both nostrils daily.   gabapentin 100 MG capsule Commonly known as: NEURONTIN Take 2 capsules by mouth at bedtime.   guaifenesin 100 MG/5ML syrup Commonly known as: ROBITUSSIN Take 200 mg by mouth every 6 (six) hours as needed for cough.   lactulose 10 GM/15ML solution Commonly known as: CHRONULAC Take 15 mLs by mouth daily as needed.   loperamide 2 MG capsule Commonly known as: IMODIUM Take 2 mg by mouth 4 (four) times daily as needed for diarrhea or loose stools.  magnesium hydroxide 400 MG/5ML suspension Commonly known as: MILK OF MAGNESIA Take 30 mLs by mouth daily as needed for mild constipation.   mirabegron ER 25 MG Tb24 tablet Commonly known as: MYRBETRIQ Take 1 tablet (25 mg total) by mouth daily.   nitroGLYCERIN 0.4 MG SL tablet Commonly known as: NITROSTAT Place 0.4 mg under the tongue every 5 (five) minutes as needed for chest pain.   nystatin cream Commonly known as: MYCOSTATIN Apply 1 application  topically as needed (panniculitis). Apply to pannus   omeprazole 20 MG capsule Commonly known as: PRILOSEC TAKE 1 CAPSULE BY MOUTH EVERY DAY.   polyethylene glycol 17 g packet Commonly known as: MIRALAX / GLYCOLAX Take 17 g by mouth daily.   Premarin vaginal cream Generic drug: conjugated  estrogens Place 0.625 g vaginally. Every week on Monday, Wednesday and friday   propranolol 40 MG tablet Commonly known as: INDERAL Take 1 tablet (40 mg total) by mouth 2 (two) times daily.   psyllium 0.52 g capsule Commonly known as: REGULOID Take 0.52 g by mouth in the morning and at bedtime.   senna 8.6 MG Tabs tablet Commonly known as: SENOKOT Take 1 tablet by mouth at bedtime.   sertraline 100 MG tablet Commonly known as: ZOLOFT TAKE ONE TABLET BY MOUTH EVERY DAY   sodium chloride 0.65 % nasal spray Commonly known as: OCEAN Place 1 spray into the nose every 12 (twelve) hours as needed.   tapentadol 50 MG tablet Commonly known as: NUCYNTA Take 50 mg by mouth 3 (three) times daily.   Ventolin HFA 108 (90 Base) MCG/ACT inhaler Generic drug: albuterol Inhale 2 puffs into the lungs every 4 (four) hours as needed.   verapamil 240 MG 24 hr capsule Commonly known as: VERELAN PM Take 240 mg by mouth at bedtime.        Discharge Exam: Filed Weights   11/01/21 0959 11/01/21 1725  Weight: 71.7 kg 71.7 kg   General: Alert and oriented x2, no acute distress Cardiovascular: Regular rate and rhythm, S1-S2 Lungs: Clear to auscultation bilaterally  Condition at discharge: good  The results of significant diagnostics from this hospitalization (including imaging, microbiology, ancillary and laboratory) are listed below for reference.   Imaging Studies: ECHOCARDIOGRAM COMPLETE  Result Date: 11/04/2021    ECHOCARDIOGRAM REPORT   Patient Name:   SUBRENA DEVEREUX Date of Exam: 11/02/2021 Medical Rec #:  [id number]      Height:       60.0 in Accession #:    [id number]     Weight:       158.1 lb Date of Birth:  Jun 25, 1921      BSA:          1.689 m Patient Age:    17 years       BP:           138/58 mmHg Patient Gender: F              HR:           85 bpm. Exam Location:  [facility] Procedure: 2D Echo, Cardiac Doppler and Color Doppler Indications:     Elevated Troponin  History:          Patient has prior history of Echocardiogram examinations, most                  recent 03/17/2020. COPD, Signs/Symptoms:Murmur and Syncope;  Risk Factors:Hypertension and Dyslipidemia.  Sonographer:     Cresenciano Lick RDCS Referring Phys:  [id number] Loletha Grayer Diagnosing Phys: Yolonda Kida MD IMPRESSIONS  1. LVH with brightly speckled myocardium. Consider an infiltrataive process.  2. Left ventricular ejection fraction, by estimation, is 70 to 75%. The left ventricle has hyperdynamic function. The left ventricle has no regional wall motion abnormalities. There is mild concentric left ventricular hypertrophy. Left ventricular diastolic parameters are consistent with Grade II diastolic dysfunction (pseudonormalization).  3. Right ventricular systolic function is mildly reduced. The right ventricular size is mildly enlarged. Mildly increased right ventricular wall thickness.  4. The mitral valve is normal in structure. Mild mitral valve regurgitation.  5. The aortic valve is normal in structure. Aortic valve regurgitation is mild. FINDINGS  Left Ventricle: Left ventricular ejection fraction, by estimation, is 70 to 75%. The left ventricle has hyperdynamic function. The left ventricle has no regional wall motion abnormalities. The left ventricular internal cavity size was normal in size. There is mild concentric left ventricular hypertrophy. Left ventricular diastolic parameters are consistent with Grade II diastolic dysfunction (pseudonormalization). Right Ventricle: The right ventricular size is mildly enlarged. Mildly increased right ventricular wall thickness. Right ventricular systolic function is mildly reduced. Left Atrium: Left atrial size was normal in size. Right Atrium: Right atrial size was normal in size. Pericardium: There is no evidence of pericardial effusion. Mitral Valve: The mitral valve is normal in structure. Mild mitral valve regurgitation. Tricuspid Valve: The  tricuspid valve is normal in structure. Tricuspid valve regurgitation is trivial. Aortic Valve: The aortic valve is normal in structure. Aortic valve regurgitation is mild. Aortic regurgitation PHT measures 431 msec. Pulmonic Valve: The pulmonic valve was normal in structure. Pulmonic valve regurgitation is not visualized. Aorta: The ascending aorta was not well visualized. IAS/Shunts: No atrial level shunt detected by color flow Doppler. Additional Comments: LVH with brightly speckled myocardium. Consider an infiltrataive process.  LEFT VENTRICLE PLAX 2D LVIDd:         3.65 cm   Diastology LVIDs:         2.12 cm   LV e' medial:    4.68 cm/s LV PW:         1.13 cm   LV E/e' medial:  16.5 LV IVS:        1.18 cm   LV e' lateral:   4.79 cm/s LVOT diam:     2.00 cm   LV E/e' lateral: 16.1 LV SV:         62 LV SV Index:   36 LVOT Area:     3.14 cm  RIGHT VENTRICLE RV Basal diam:  3.94 cm RV S prime:     13.95 cm/s TAPSE (M-mode): 1.7 cm LEFT ATRIUM             Index        RIGHT ATRIUM           Index LA diam:        4.20 cm 2.49 cm/m   RA Area:     12.70 cm LA Vol (A2C):   47.1 ml 27.89 ml/m  RA Volume:   30.40 ml  18.00 ml/m LA Vol (A4C):   60.3 ml 35.70 ml/m LA Biplane Vol: 56.0 ml 33.16 ml/m  AORTIC VALVE LVOT Vmax:   92.30 cm/s LVOT Vmean:  63.850 cm/s LVOT VTI:    0.196 m AI PHT:      431 msec  AORTA Ao Root diam: 3.10 cm  Ao Asc diam:  3.50 cm MITRAL VALVE                TRICUSPID VALVE MV Area (PHT): 3.53 cm     TR Peak grad:   33.9 mmHg MV Decel Time: 215 msec     TR Vmax:        291.00 cm/s MV E velocity: 77.10 cm/s MV A velocity: 104.00 cm/s  SHUNTS MV E/A ratio:  0.74         Systemic VTI:  0.20 m                             Systemic Diam: 2.00 cm Yolonda Kida MD Electronically signed by Yolonda Kida MD Signature Date/Time: 11/04/2021/6:35:55 AM    Final    CT ABDOMEN PELVIS WO CONTRAST  Result Date: 11/01/2021 CLINICAL DATA:  Abdominal pain, acute, nonlocalized. Right lower extremity  wound. EXAM: CT ABDOMEN AND PELVIS WITHOUT CONTRAST TECHNIQUE: Multidetector CT imaging of the abdomen and pelvis was performed following the standard protocol without IV contrast. RADIATION DOSE REDUCTION: This exam was performed according to the departmental dose-optimization program which includes automated exposure control, adjustment of the mA and/or kV according to patient size and/or use of iterative reconstruction technique. COMPARISON:  None FINDINGS: Lower chest: Small bilateral pleural effusions are present. Right greater than left dependent airspace disease is so seated. The heart is enlarged. Coronary artery calcifications are present. Calcifications are present at the aortic valve. Hepatobiliary: No focal liver abnormality is seen. Status post cholecystectomy. No biliary dilatation. Pancreas: Unremarkable. No pancreatic ductal dilatation or surrounding inflammatory changes. Spleen: Normal in size without focal abnormality. Adrenals/Urinary Tract: The adrenal glands are normal bilaterally. A low-density 2 cm parenchymal lesion is noted posteriorly in the left kidney. No other focal lesions are present. No stone is present. No obstruction is present. The ureters are within normal limits bilaterally. The urinary bladder is within normal limits. Stomach/Bowel: The stomach and duodenum are collapsed. Small bowel is unremarkable. Terminal ileum is within normal limits. The appendix is visualized and normal. The ascending and transverse colon are within normal limits. The descending colon is normal. Is the sigmoid colon is redundant extending into the upper abdomen without evidence for torsion or obstruction. Distal sigmoid colon and rectum are within normal limits. Mild stranding is present in the mesentery. No fluid collection is present. Vascular/Lymphatic: Atherosclerotic calcifications are present aorta branch vessels without aneurysm. Reproductive: Status post hysterectomy. No adnexal masses. Other: A  paraumbilical hernia contains fat but no bowel. A right inguinal hernia also contains fat without bowel. No other significant ventral hernias are present. No free fluid or free air is present. Musculoskeletal: Rightward curvature of the thoracolumbar spine is centered at L1-2 with lateral listhesis at L2-3 and L3-4. Grade 1 anterolisthesis is present at L4-5. Slight retrolisthesis present at L2-3. Endplate sclerotic changes are greatest on the left at L1-2 and L2-3. Right proximal femur ORIF noted. No focal osseous lesions are present. IMPRESSION: 1. No acute or focal abnormality to explain the patient's symptoms. 2. Redundant sigmoid colon extending into the upper abdomen without evidence for torsion or obstruction. 3. Small bilateral pleural effusions with right greater than left dependent airspace disease. This may represent atelectasis. Infection is considered less likely. 4. Cardiomegaly with coronary artery disease. 5. Aortic Atherosclerosis (ICD10-I70.0). Electronically Signed   By: San Morelle M.D.   On: 11/01/2021 15:08   US Venous Img Lower  Unilateral Right  Result Date: 11/01/2021 CLINICAL DATA:  88 year old female with a history of leg pain EXAM: RIGHT LOWER EXTREMITY VENOUS DOPPLER ULTRASOUND TECHNIQUE: Gray-scale sonography with graded compression, as well as color Doppler and duplex ultrasound were performed to evaluate the lower extremity deep venous systems from the level of the common femoral vein and including the common femoral, femoral, profunda femoral, popliteal and calf veins including the posterior tibial, peroneal and gastrocnemius veins when visible. The superficial great saphenous vein was also interrogated. Spectral Doppler was utilized to evaluate flow at rest and with distal augmentation maneuvers in the common femoral, femoral and popliteal veins. COMPARISON:  None Available. FINDINGS: Contralateral Common Femoral Vein: Respiratory phasicity is normal and symmetric with  the symptomatic side. No evidence of thrombus. Normal compressibility. Common Femoral Vein: No evidence of thrombus. Normal compressibility, respiratory phasicity and response to augmentation. Saphenofemoral Junction: No evidence of thrombus. Normal compressibility and flow on color Doppler imaging. Profunda Femoral Vein: No evidence of thrombus. Normal compressibility and flow on color Doppler imaging. Femoral Vein: No evidence of thrombus. Normal compressibility, respiratory phasicity and response to augmentation. Popliteal Vein: No evidence of thrombus. Normal compressibility, respiratory phasicity and response to augmentation. Calf Veins: No evidence of thrombus. Normal compressibility and flow on color Doppler imaging. Superficial Great Saphenous Vein: No evidence of thrombus. Normal compressibility and flow on color Doppler imaging. Other Findings:  Edema IMPRESSION: Directed duplex right lower extremity negative for DVT. Signed, Dulcy Fanny. Nadene Rubins, RPVI [facility] Radiology Electronically Signed   By: Corrie Mckusick D.O.   On: 11/01/2021 12:01   DG Chest Portable 1 View  Result Date: 11/01/2021 CLINICAL DATA:  Dyspnea, wound infection, sepsis EXAM: PORTABLE CHEST 1 VIEW COMPARISON:  09/20/2020 chest radiograph. FINDINGS: Stable cardiomediastinal silhouette with normal heart size. No pneumothorax. Stable mild eventration of the right hemidiaphragm. Probable small right pleural effusion, similar. No left pleural effusion. No pulmonary edema. Stable mild right basilar scarring versus atelectasis. IMPRESSION: Stable mild right hemidiaphragm eventration, small right pleural effusion and mild right basilar scarring versus atelectasis. Electronically Signed   By: Ilona Sorrel M.D.   On: 11/01/2021 11:46    Microbiology: Results for orders placed or performed during the hospital encounter of 11/01/21  Urine Culture     Status: Abnormal   Collection  Time: 11/01/21 11:27 AM   Specimen: Urine, Clean Catch  Result Value Ref Range Status   Specimen Description   Final    URINE, CLEAN CATCH Performed at [facility], [address] [place], [place] [address]    Special Requests   Final    NONE Performed at [facility], [address] [place], [place] [address]    Culture 50,000 COLONIES/mL KLEBSIELLA OZAENAE (A)  Final   Report Status 11/03/2021 FINAL  Final   Organism ID, Bacteria KLEBSIELLA OZAENAE (A)  Final      Susceptibility   Klebsiella ozaenae - MIC*    AMPICILLIN >=32 RESISTANT Resistant     CEFAZOLIN <=4 SENSITIVE Sensitive     CEFEPIME <=0.12 SENSITIVE Sensitive     CEFTRIAXONE <=0.25 SENSITIVE Sensitive     CIPROFLOXACIN <=0.25 SENSITIVE Sensitive     GENTAMICIN <=1 SENSITIVE Sensitive     IMIPENEM <=0.25 SENSITIVE Sensitive     NITROFURANTOIN 64 INTERMEDIATE Intermediate     TRIMETH/SULFA <=20 SENSITIVE Sensitive     AMPICILLIN/SULBACTAM 4 SENSITIVE Sensitive     PIP/TAZO <=4 SENSITIVE Sensitive     *  50,000 COLONIES/mL KLEBSIELLA OZAENAE  Blood culture (routine x 2)     Status: None (Preliminary result)   Collection Time: 11/01/21 11:27 AM   Specimen: BLOOD  Result Value Ref Range Status   Specimen Description BLOOD BLOOD LEFT WRIST  Final   Special Requests   Final    BOTTLES DRAWN AEROBIC AND ANAEROBIC Blood Culture results may not be optimal due to an excessive volume of blood received in culture bottles   Culture   Final    NO GROWTH 4 DAYS Performed at [facility], [address], [place], [place] [address]    Report Status PENDING  Incomplete  Blood culture (routine x 2)     Status: None (Preliminary result)   Collection Time: 11/01/21 11:27 AM   Specimen: BLOOD  Result Value Ref Range Status   Specimen Description BLOOD BLOOD LEFT ARM  Final   Special Requests   Final    BOTTLES DRAWN AEROBIC AND ANAEROBIC Blood Culture results may not be optimal due to an excessive  volume of blood received in culture bottles   Culture   Final    NO GROWTH 4 DAYS Performed at [facility] [address] [place], [place] [address]    Report Status PENDING  Incomplete  MRSA Next Gen by PCR, Nasal     Status: None   Collection Time: 11/01/21  5:31 PM   Specimen: Nasal Mucosa; Nasal Swab  Result Value Ref Range Status   MRSA by PCR Next Gen NOT DETECTED NOT DETECTED Final    Comment: (NOTE) The GeneXpert MRSA Assay (FDA approved for NASAL specimens only), is one component of a comprehensive MRSA colonization surveillance program. It is not intended to diagnose MRSA infection nor to guide or monitor treatment for MRSA infections. Test performance is not FDA approved in patients less than 5 years old. Performed at [facility], [address] [place], [place] [address]     Labs: CBC: Recent Labs  Lab 11/01/21 1004 11/02/21 0416 11/03/21 0426 11/04/21 0322 11/05/21 0338  WBC 23.1* 19.7* 14.2* 10.0 9.0  HGB 10.9* 10.5* 10.3* 10.1* 10.2*  HCT 34.2* 32.7* 32.2* 31.1* 32.1*  MCV 94.5 95.1 93.9 93.4 93.3  PLT 166 176 228 229 826   Basic Metabolic Panel: Recent Labs  Lab 11/01/21 1004 11/01/21 1944 11/02/21 0416 11/03/21 0426 11/04/21 0322 11/05/21 0338  NA 136  --  136 136 138 140  K 3.5  --  3.2* 3.4* 3.4* 2.9*  CL 103  --  106 105 108 105  CO2 27  --  _0 GLUCOSE 134*  --  103* 101* 103* 101*  BUN 30*  --  32* 32* 24* 16  CREATININE 1.16*  --  1.04* 0.75 0.84 0.73  CALCIUM 8.5*  --  8.0* 7.9* 7.8* 7.7*  MG  --  1.7 1.6* 2.0  --  1.7  PHOS  --  3.8 2.9 1.8*  --   --    Liver Function Tests: No results for input(s): "AST", "ALT", "ALKPHOS", "BILITOT", "PROT", "ALBUMIN" in the last 168 hours. CBG: Recent Labs  Lab 11/01/21 1734  GLUCAP 169*    Discharge time spent: less than 30 minutes.  Signed: Annita Brod, MD [facility] 11/05/2021

## 2021-11-06 LAB — CULTURE, BLOOD (ROUTINE X 2)
Culture: NO GROWTH
Culture: NO GROWTH

## 2021-11-12 ENCOUNTER — Non-Acute Institutional Stay: Payer: Medicare Other | Admitting: Student

## 2021-11-12 DIAGNOSIS — K5900 Constipation, unspecified: Secondary | ICD-10-CM

## 2021-11-12 DIAGNOSIS — R52 Pain, unspecified: Secondary | ICD-10-CM

## 2021-11-12 DIAGNOSIS — L03115 Cellulitis of right lower limb: Secondary | ICD-10-CM

## 2021-11-12 DIAGNOSIS — Z515 Encounter for palliative care: Secondary | ICD-10-CM

## 2021-11-12 DIAGNOSIS — R600 Localized edema: Secondary | ICD-10-CM

## 2022-01-14 ENCOUNTER — Non-Acute Institutional Stay: Payer: Medicare Other | Admitting: Student

## 2022-01-14 DIAGNOSIS — R52 Pain, unspecified: Secondary | ICD-10-CM

## 2022-01-14 DIAGNOSIS — I5032 Chronic diastolic (congestive) heart failure: Secondary | ICD-10-CM

## 2022-01-14 DIAGNOSIS — R531 Weakness: Secondary | ICD-10-CM

## 2022-01-14 DIAGNOSIS — Z515 Encounter for palliative care: Secondary | ICD-10-CM

## 2022-01-14 NOTE — Progress Notes (Signed)
Therapist, nutritional Palliative Care Consult Note Telephone: [phone]  Fax: [phone]    Date of encounter: 01/14/22 1:07 PM PATIENT NAME: Cindy Graham [address] [place] [address]   [phone] (home)  DOB: 1921-12-28 MRN: [id number] PRIMARY CARE PROVIDER:    [facility],  [address] [place] [address] [phone]  REFERRING PROVIDER:   [facility] [address] [place],  [place] [address] [phone]  RESPONSIBLE PARTY:    Contact Information     Name Relation Home Work [place] Son [phone]  [phone]   June Leap Daughter [phone]          I met face to face with patient in the facility. Palliative Care was asked to follow this patient by consultation request of  [facility] to address advance care planning and complex medical decision making. This is a follow up visit.                                   ASSESSMENT AND PLAN / RECOMMENDATIONS:   Advance Care Planning/Goals of Care: Goals include to maximize quality of life and symptom management. Patient/health care surrogate gave his/her permission to discuss. Our advance care planning conversation included a discussion about:    The value and importance of advance care planning  Experiences with loved ones who have been seriously ill or have died  Exploration of personal, cultural or spiritual beliefs that might influence medical decisions  Exploration of goals of care in the event of a sudden injury or illness  CODE STATUS: DNR  Education provided on palliative medicine vs. Hospice services. Will continue to provide ongoing support, symptom management.  Symptom Management/Plan:  Pain-DDD, lumbar and cervical radiculitis. Pain is currently managed with Nucynta; continue TID, gabapentin 200 mg QHS, acetaminophen PRN.   Generalized weakness-patient continues to receive therapy. Use  w/c for locomotion; staff to assist with adl's as needed.   Diastolic CHF-endorses mild LE edema, shortness of breath with exertion. Patient also reports feeling more tired recently. Continue compression stockings to BLE.   Follow up Palliative Care Visit: Palliative care will continue to follow for complex medical decision making, advance care planning, and clarification of goals. Return in 4-6 weeks or prn.   This visit was coded based on medical decision making (MDM).  PPS: 40%  HOSPICE ELIGIBILITY/DIAGNOSIS: TBD  Chief Complaint: Palliative Medicine follow up visit.   HISTORY OF PRESENT ILLNESS:  Cindy Graham is a 88 y.o. year old female  with DDD, lumbar and cervical radiculopathy, arthritis, anxiety, asthma, COPD, GERD, hypertension, hyperlipidemia, melanoma. Patient hospitalized 6/12-6/16/23 due to cellulitis of RLE, septic shock, acute on chronic diastolic CHF, CKD, UTI.   Patient states she is still working with therapy. She has had two falls since last palliative visit with no apparent injury. She does report having more days where she is not feeling as well "but can't pinpoint anything." Her pain is managed well with the Nucynta. Endorses shortness of breath with exertion; mild edema to LE has improved. She did state cellulitis finally cleared to her RLE and she dropped a book on her leg. Sustained a skin tear, but it is almost healed. Her appetite has been better; going to dining room for meals. She is napping more during the day. Expresses mood being stable; she is reading, doing puzzles and participating  in activities, church services.   History obtained from review of EMR, discussion with primary team, and interview with family, facility staff/caregiver and/or Cindy Graham.  I reviewed available labs, medications, imaging, studies and related documents from the EMR.  Records reviewed and summarized above.   ROS  10-point ROS is negative, except for the pertinent positives and  negatives detailed per the HPI.   Physical Exam: Pulse 62, resp 16, b/p 108/54, sats 97% on room air Constitutional: NAD General: frail appearing, thin EYES: anicteric sclera, lids intact, no discharge  ENMT: intact hearing with hearing aid, oral mucous membranes moist CV: S1S2, RRR, 1+ edema to RLE Pulmonary: LCTA except fine crackles to right base , no increased work of breathing, no cough, room air Abdomen: normo-active BS + 4 quadrants, soft and non tender, no ascites GU: deferred MSK: moves all extremities, w/c for locomotion Skin: warm and dry, no , dressing to anterior right leg Neuro:  +generalized weakness,  no cognitive impairment Psych: non-anxious affect, A and O x 3, pleasant Hem/lymph/immuno: no widespread bruising   Thank you for the opportunity to participate in the care of Cindy Graham.  The palliative care team will continue to follow. Please call our office at [phone] if we can be of additional assistance.   Ezekiel Slocumb, NP   COVID-19 PATIENT SCREENING TOOL Asked and negative response unless otherwise noted:   Have you had symptoms of covid, tested positive or been in contact with someone with symptoms/positive test in the past 5-10 days? No

## 2022-03-15 ENCOUNTER — Emergency Department: Payer: Medicare Other

## 2022-03-15 ENCOUNTER — Emergency Department
Admission: EM | Admit: 2022-03-15 | Discharge: 2022-03-15 | Disposition: A | Discharge status: Skilled Nursing Facility | Payer: Medicare Other | Attending: Emergency Medicine | Admitting: Emergency Medicine

## 2022-03-15 ENCOUNTER — Other Ambulatory Visit: Payer: Self-pay

## 2022-03-15 DIAGNOSIS — Y92129 Unspecified place in nursing home as the place of occurrence of the external cause: Secondary | ICD-10-CM | POA: Diagnosis not present

## 2022-03-15 DIAGNOSIS — S022XXA Fracture of nasal bones, initial encounter for closed fracture: Secondary | ICD-10-CM

## 2022-03-15 DIAGNOSIS — I10 Essential (primary) hypertension: Secondary | ICD-10-CM | POA: Insufficient documentation

## 2022-03-15 DIAGNOSIS — S8002XA Contusion of left knee, initial encounter: Secondary | ICD-10-CM | POA: Diagnosis not present

## 2022-03-15 DIAGNOSIS — W19XXXA Unspecified fall, initial encounter: Secondary | ICD-10-CM

## 2022-03-15 DIAGNOSIS — W06XXXA Fall from bed, initial encounter: Secondary | ICD-10-CM | POA: Diagnosis not present

## 2022-03-15 DIAGNOSIS — S0083XA Contusion of other part of head, initial encounter: Secondary | ICD-10-CM | POA: Insufficient documentation

## 2022-03-15 DIAGNOSIS — T148XXA Other injury of unspecified body region, initial encounter: Secondary | ICD-10-CM

## 2022-03-15 DIAGNOSIS — S0031XA Abrasion of nose, initial encounter: Secondary | ICD-10-CM | POA: Diagnosis present

## 2022-03-15 NOTE — ED Notes (Signed)
Ems was called to transport patient back to [facility]

## 2022-03-15 NOTE — ED Provider Notes (Signed)
[facility] Provider Note    None    (approximate)  History   Chief Complaint: Fall  HPI  Cindy Graham is a 88 y.o. female with a past medical history of anxiety, hypertension, hyperlipidemia, presents from her nursing facility after mechanical fall.  According to the patient and EMS reports she was trying to get in bed at her nursing facility when she fell.  Patient hit her head on the nightstand and hit her left knee on the ground.  Patient is complaining of pain to the left knee where she has a moderate hematoma.  States very slight headache.  Patient has an abrasion to her nasal bone and hematoma to the right forehead.  Patient denies any blood thinners.  Physical Exam   Triage Vital Signs: ED Triage Vitals  Enc Vitals Group     BP      Pulse      Resp      Temp      Temp src      SpO2      Weight      Height      Head Circumference      Peak Flow      Pain Score      Pain Loc      Pain Edu?      Excl. in [place]?     Most recent vital signs: There were no vitals filed for this visit.  General: Awake, no distress.  Small hematoma to the right forehead abrasion to the nasal bridge CV:  Good peripheral perfusion.  Regular rate and rhythm  Resp:  Normal effort.  Equal breath sounds bilaterally.  Abd:  No distention.  Soft, nontender.  No rebound or guarding. Other:  Moderate hematoma overlying the left knee.  Hips are nontender bilaterally.  Good range of motion in bilateral upper extremities.  ED Results / Procedures / Treatments   MEDICATIONS ORDERED IN ED: Medications - No data to display   IMPRESSION / MDM / [place] / ED COURSE  I reviewed the triage vital signs and the nursing notes.  Patient's presentation is most consistent with acute presentation with potential threat to life or bodily function.  Patient presents emergency department after a fall.  Patient did hit her head has a hematoma above the right eye has an  abrasion to the nasal bridge.  She also has a moderate left knee hematoma.  Will obtain x-ray images of the knee to rule out fracture.  We will obtain CT imaging of the head C-spine and maxillofacial bones to rule out fracture or intracranial abnormality.  We will also obtain a pelvis x-ray as a precaution of the known tender hips on my examination.  Patient agreeable to plan of care.  I have reviewed and interpreted the knee x-ray images I do not see any obvious fracture on my evaluation. Radiology has read the x-ray is negative for fracture or dislocation. Pelvis x-rays negative CT scan of the head is negative, cervical spine is negative.  CT scan of the maxillofacial bones does show a right-sided nasal bone fracture.  Overall patient appears well, son is here with the patient.  Patient normally is in a wheelchair but does help stand for transfers.  We will ensure the patient is able to stand.  As long as patient is able to stand with assistance we will discharge back to her nursing facility.  The son states he would like to take  her back to the nursing facility.  FINAL CLINICAL IMPRESSION(S) / ED DIAGNOSES   Fall Nasal bone fracture   Note:  This document was prepared using Dragon voice recognition software and may include unintentional dictation errors.   Harvest Dark, MD 03/15/22 Delrae Rend

## 2022-03-15 NOTE — ED Triage Notes (Addendum)
Pt arrives from [facility] via [facility] d/t fall. Pt states that she fell getting into bed and hit night stand. Presents w/rt side of face, nose and eye scratched and bruised, bleeding stopped at this time.  Left knee swollen, purple, blue. Pt A&Ox4, NAD.Pt denies blood thinners.

## 2022-03-30 ENCOUNTER — Non-Acute Institutional Stay: Payer: Medicare Other | Admitting: Student

## 2022-03-30 DIAGNOSIS — Z515 Encounter for palliative care: Secondary | ICD-10-CM

## 2022-03-30 DIAGNOSIS — R5381 Other malaise: Secondary | ICD-10-CM

## 2022-03-30 DIAGNOSIS — R52 Pain, unspecified: Secondary | ICD-10-CM

## 2022-03-30 DIAGNOSIS — R531 Weakness: Secondary | ICD-10-CM

## 2022-03-30 DIAGNOSIS — I5032 Chronic diastolic (congestive) heart failure: Secondary | ICD-10-CM

## 2022-04-17 NOTE — Progress Notes (Signed)
[facility] Palliative Care Consult Note Telephone: [phone]  Fax: [phone]    Date of encounter: 03/30/22  PATIENT NAME: Cindy Graham [address] [place] [address]   [phone] (home)  DOB: March 29, 1922 MRN: [id number] PRIMARY CARE PROVIDER:    [facility],  [place] [address] [phone]  REFERRING PROVIDER:   [place] [facility] [place] [address] [place],  [place] [address] [phone]  RESPONSIBLE PARTY:    Contact Information     Name Relation Home Work [place] Son [phone]  [phone]   Cindy Graham Daughter [phone]          I met face to face with patient in the facility. Palliative Care was asked to follow this patient by consultation request of  [facility]* to address advance care planning and complex medical decision making. This is a follow up visit.                                   ASSESSMENT AND PLAN / RECOMMENDATIONS:   Advance Care Planning/Goals of Care: Goals include to maximize quality of life and symptom management. Patient/health care surrogate gave his/her permission to discuss. CODE STATUS: DNR  Education provided on palliative medicine vs. Hospice services. Will continue to provide ongoing support, symptom management.   Symptom Management/Plan:  Diastolic CHF- endorses occasional shortness of breath with exertion, mild edema. Continue compression stockings to BLE. Monitor for worsening symptoms.   Debility, weakness-patient is s/p fall. She sustained nasal fracture and left knee hematoma. She is requiring additional assistance with adl's. She is receiving therapy; using w/c for locomotion. Monitor for falls/safety. Patient encouraged to call for assistance.   Pain-chronic pain to low back, right knee, left shoulder, hematoma to left knee.continue Nucynta 50 mg TID, gabapentin 200 mg daily,  prednisone taper, acetaminophen PRN.  Follow up Palliative Care Visit: Palliative care will continue to follow for complex medical decision making, advance care planning, and clarification of goals. Return in 8 weeks or prn.  This visit was coded based on medical decision making (MDM).  PPS: 40%  HOSPICE ELIGIBILITY/DIAGNOSIS: TBD  Chief Complaint: Palliative Medicine follow up visit.   HISTORY OF PRESENT ILLNESS:  Cindy Graham is a 88 y.o. year old female  with DDD, lumbar and cervical radiculopathy, arthritis, anxiety, asthma, COPD, GERD, hypertension, hyperlipidemia, melanoma.    Patient resides at [facility]. Patient was seen in ED on 03/15/22; she fell while trying to get into bed and sustained a nasal fracture. Moderate hematoma noted to left knee; no fracture.  Patient expresses tenderness to left knee; she reports having worsening left shoulder pain; otherwise states her pain has been managed. She was started on prednisone taper. Endorses shortness of breath with exertion; states her leg edema has been managed. She does endorse weakness; she uses w/c for locomotion. She is transferring herself to and from w/c. She is working with therapy.  History obtained from review of EMR, discussion with primary team, and interview with family, facility staff/caregiver and/or Ms. Agrawal.  I reviewed available labs, medications, imaging, studies and related documents from the EMR.  Records reviewed and summarized above.   ROS  A 10-Point ROS is negative, except for the pertinent positives and negatives detailed per the HPI  Physical Exam: Pulse , resp 16, b/p 142/72, sats 96% on  room air Constitutional: NAD General: frail appearing EYES: anicteric sclera, lids intact, no discharge  ENMT: intact hearing, oral mucous membranes moist CV: S1S2, RRR,  1+ LE edema Pulmonary: LCTA except right base slightly diminished, no increased work of breathing, no cough, room air Abdomen:  normo-active BS + 4 quadrants, soft and non tender, no ascites GU: deferred MSK: moves all extremities, w/c for locomotion Skin: warm and dry, no rashes, purple bruising/hematoma, swelling to left knee Neuro: + generalized weakness Psych: non-anxious affect, A and O x 3, forgetful Hem/lymph/immuno: no widespread bruising   Thank you for the opportunity to participate in the care of Cindy Graham. Please call our office at [phone] if we can be of additional assistance.   Cindy Slocumb, NP   COVID-19 PATIENT SCREENING TOOL Asked and negative response unless otherwise noted:   Have you had symptoms of covid, tested positive or been in contact with someone with symptoms/positive test in the past 5-10 days? No

## 2022-05-26 ENCOUNTER — Non-Acute Institutional Stay: Payer: Medicare Other | Admitting: Hospice

## 2022-05-26 DIAGNOSIS — I5032 Chronic diastolic (congestive) heart failure: Secondary | ICD-10-CM

## 2022-05-26 DIAGNOSIS — Z515 Encounter for palliative care: Secondary | ICD-10-CM

## 2022-05-26 DIAGNOSIS — R531 Weakness: Secondary | ICD-10-CM

## 2022-05-26 DIAGNOSIS — R52 Pain, unspecified: Secondary | ICD-10-CM

## 2022-05-26 NOTE — Progress Notes (Signed)
    Cindy Graham: [phone]  Fax: [phone]      PATIENT NAME: Cindy Graham [address] [place] [address]   [phone] (home)  DOB: Jun 30, 1921 MRN: [id number] PRIMARY CARE PROVIDER:    [facility],  [place] [address] [phone]  REFERRING PROVIDER:   [facility] [place] [address] [place],  [place] [address] [phone]  RESPONSIBLE PARTY:    Contact Information     Name Relation Home Work Cindy Graham Son [phone]  [phone]   Cindy Graham Daughter [phone]          I met face to face with patient in the facility. Palliative Care was asked to follow this patient by consultation request of  [facility]* to address advance care planning and complex medical decision making. This is a follow up visit.                                   ASSESSMENT AND PLAN / RECOMMENDATIONS:   Advance Care Planning/Goals of Care: Goals include to maximize quality of life and symptom management. Patient/health care surrogate gave his/her permission to discuss. CODE STATUS: DNR  Symptom Management/Plan:  Diastolic CHF- stable, no edema. Continue compression stockings to BLE. Monitor for worsening symptoms edema, shortness of breath.   Generalized weakness: Related to multiple morbidities of CHF, hypertension, arthritis.  Patient with gait instability and history of fall.  PT/OT is in process.  Provide additional assistance with adl's as needed.  Fall precautions.  Pain-chronic pain to low back, right knee, left shoulder, with managed with Nucynta 50 mg TID, gabapentin 200 mg daily, and Tylenol as needed.  Follow up Palliative Care Visit: Palliative care will continue to follow for complex medical decision making, advance care planning, and clarification of goals. Return in 8 weeks or prn.  This visit  was coded based on medical decision making (MDM).  PPS: 40%  HOSPICE ELIGIBILITY/DIAGNOSIS: TBD  Chief Complaint: Palliative Medicine follow up visit.   HISTORY OF PRESENT ILLNESS:  Cindy Graham is a 89 y.o. year old female  with DDD, lumbar and cervical radiculopathy, arthritis, anxiety, asthma, COPD, GERD, hypertension, hyperlipidemia, melanoma.  Patient endorses occasional pain in low back and right knee and reports well-managed with current pain regimen; no pain during visit. History obtained from review of EMR, discussion with primary team, and interview with family, facility staff/caregiver and/or Ms. Byers.   All 10 point systems reviewed and are negative except as documented in history of present illness above  I reviewed available labs, medications, imaging, studies and related documents from the EMR.  Records reviewed and summarized above.    I spent 45 minutes providing this consultation; this includes time spent with patient/family, chart review and documentation. More than 50% of the time in this consultation was spent on counseling and coordinating communication. Thank you for the opportunity to participate in the care of Ms. Hendryx. Please call our office at [phone] if we can be of additional assistance.   Note: Portions of this note were generated with Lobbyist. Dictation errors may occur despite best attempts at proofreading.    Teodoro Spray, NP

## 2022-07-04 ENCOUNTER — Non-Acute Institutional Stay: Payer: Medicare Other | Admitting: Hospice

## 2022-07-04 DIAGNOSIS — R531 Weakness: Secondary | ICD-10-CM

## 2022-07-04 DIAGNOSIS — Z515 Encounter for palliative care: Secondary | ICD-10-CM

## 2022-07-04 DIAGNOSIS — I5032 Chronic diastolic (congestive) heart failure: Secondary | ICD-10-CM

## 2022-07-04 DIAGNOSIS — R52 Pain, unspecified: Secondary | ICD-10-CM

## 2022-07-04 NOTE — Progress Notes (Signed)
    [facility] Consult Note Telephone: [phone]  Fax: [phone]      PATIENT NAME: Cindy Graham [address] [place] [address]   [phone] (home)  DOB: Feb 25, 1922 MRN: [id number] PRIMARY CARE PROVIDER:    [facility],  [place] [address] [phone]  REFERRING PROVIDER:   [facility], [facility] [place] [address] [place],  [place] [address] [phone]  RESPONSIBLE PARTY:    Contact Information     Name Relation Home Work [place] Son [phone]  [phone]   [name] Daughter [phone]          I met face to face with patient in the facility. Palliative Care was asked to follow this patient by consultation request of  [facility]* to address advance care planning and complex medical decision making. This is a follow up visit.                                   ASSESSMENT AND PLAN / RECOMMENDATIONS:   Advance Care Planning/Goals of Care: Goals include to maximize quality of life and symptom management. Patient/health care surrogate gave his/her permission to discuss. CODE STATUS: DNR  Symptom Management/Plan:  Diastolic CHF-  no exacerbation;  no edema. Continue compression stockings to BLE. Monitor for worsening symptoms edema, shortness of breath.  Monitor weight and report weight gain of 2 pounds in a day or 5 pounds in a week. Generalized weakness: Related to multiple morbidities of CHF, hypertension, arthritis.  Patient with gait instability and history of fall.  Continue PT/OT.  Provide additional assistance with adl's as needed.  Fall precautions.  Pain- chronic pain in right knee, left shoulder. Continue Nucynta 50 mg TID, gabapentin 200 mg daily, and Tylenol as needed.  Follow up Palliative Care Visit: Palliative care will continue to follow for complex medical decision making, advance care planning,  and clarification of goals. Return in 8 weeks or prn.  PPS: 40%  HOSPICE ELIGIBILITY/DIAGNOSIS: TBD  Chief Complaint: Palliative Medicine follow up visit.   HISTORY OF PRESENT ILLNESS:  Cindy Graham is a 89 y.o. year old female  with DDD, lumbar and cervical radiculopathy, arthritis, anxiety, asthma, COPD, GERD, hypertension, hyperlipidemia, melanoma.  Patient endorses occasional pain in right knee and reports well-managed with current pain regimen; no pain during visit.  Patient reports pain is well-managed; no pain during visit History obtained from review of EMR, discussion with primary team, and interview with family, facility staff/caregiver and/or [name].   All 10 point systems reviewed and are negative except as documented in history of present illness above  I reviewed available labs, medications, imaging, studies and related documents from the EMR.  Records reviewed and summarized above.    I spent 40 minutes providing this consultation; this includes time spent with patient/family, chart review and documentation. More than 50% of the time in this consultation was spent on counseling and coordinating communication. Thank you for the opportunity to participate in the care of Cindy Graham. Please call our office at [phone] if we can be of additional assistance.   Note: Portions of this note were generated with Lobbyist. Dictation errors may occur despite best attempts at proofreading.    Teodoro Spray, NP

## 2022-07-27 ENCOUNTER — Non-Acute Institutional Stay: Payer: Medicare Other | Admitting: Hospice

## 2022-07-27 DIAGNOSIS — R531 Weakness: Secondary | ICD-10-CM

## 2022-07-27 DIAGNOSIS — K5900 Constipation, unspecified: Secondary | ICD-10-CM

## 2022-07-27 DIAGNOSIS — R52 Pain, unspecified: Secondary | ICD-10-CM

## 2022-07-27 DIAGNOSIS — I5032 Chronic diastolic (congestive) heart failure: Secondary | ICD-10-CM

## 2022-07-27 DIAGNOSIS — K5901 Slow transit constipation: Secondary | ICD-10-CM

## 2022-07-27 DIAGNOSIS — Z515 Encounter for palliative care: Secondary | ICD-10-CM

## 2022-07-27 NOTE — Progress Notes (Signed)
[facility] Consult Note Telephone: [phone]  Fax: [phone]      PATIENT NAME: Cindy Cindy Graham [address] [place] [address]   [phone] (home)  DOB: 1922-02-22 MRN: [id number] PRIMARY CARE PROVIDER:    [facility],  [place] [address] [phone]  REFERRING PROVIDER:   Lajuana Ripple, NP   RESPONSIBLE PARTY:    Contact Information     Name Relation Home Work [place]   Cindy, Cindy Graham [phone]  [phone]   Cindy Cindy Graham Daughter [phone]          I met face to face with patient in the facility. Palliative Care was asked to follow this patient by consultation request of  Lajuana Ripple, NP to address advance care planning and complex medical decision making. This is a follow up visit.                                   ASSESSMENT AND PLAN / RECOMMENDATIONS:   Advance Care Planning/Goals of Care: Goals include to maximize quality of life and symptom management. Patient/health care surrogate gave his/her permission to discuss. CODE STATUS: DNR  Symptom Management/Plan:  Diastolic CHF-  no exacerbation since last visit;  no edema, weight fairly stable in the last 2 months 07/08/2022 is 131 pounds 07/22/2022 is 131 pounds.  Continue compression stockings to BLE. Monitor for symptoms of edema, shortness of breath.  Monitor weight and report weight gain of 2 pounds in a day or 5 pounds in a week. Generalized weakness: Continue PT/OT. Related to multiple morbidities of CHF, hypertension, arthritis.  Patient with gait instability and history of fall.   Provide additional assistance with adl's as needed.  Fall precautions.  Pain- chronic pain in right knee, left shoulder. Continue Nucynta 50 mg TID, gabapentin 200 mg daily, and Tylenol as needed. Constipation: Docusate sodium 50 mg daily.  Follow up Palliative Care Visit: Palliative care will  continue to follow for complex medical decision making, advance care planning, and clarification of goals. Return in 8 weeks or prn.  PPS: 40%  HOSPICE ELIGIBILITY/DIAGNOSIS: TBD  Chief Complaint: Palliative Medicine follow up visit.   HISTORY OF PRESENT ILLNESS:  Cindy Cindy Graham is a 89 y.o. year old female  with DDD, lumbar and cervical radiculopathy, arthritis, anxiety, asthma, COPD, GERD, hypertension, hyperlipidemia, melanoma.  Patient endorses occasional pain in right knee and reports well-managed with current pain regimen; no pain during visit.  Patient reports doing well overall. 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   All 10 point systems reviewed and are negative except as documented in history of present illness above  I reviewed available labs, medications, imaging, studies and related documents from the EMR.  Records reviewed and summarized above.    I spent 40 minutes providing this consultation; this includes time spent with patient/family, chart review and documentation. More than 50% of the time in this consultation was spent on counseling and coordinating communication. Thank you for the opportunity to participate in the care of Cindy Cindy Graham. Please call our office at [phone] if we can be of additional assistance.   Note: Portions of this note were generated with Lobbyist. Dictation errors may occur despite best attempts at proofreading.    Teodoro Spray, NP

## 2022-09-14 ENCOUNTER — Non-Acute Institutional Stay: Payer: Medicare Other | Admitting: Hospice

## 2022-09-14 DIAGNOSIS — R52 Pain, unspecified: Secondary | ICD-10-CM

## 2022-09-14 DIAGNOSIS — R531 Weakness: Secondary | ICD-10-CM

## 2022-09-14 DIAGNOSIS — I5032 Chronic diastolic (congestive) heart failure: Secondary | ICD-10-CM

## 2022-09-14 DIAGNOSIS — Z515 Encounter for palliative care: Secondary | ICD-10-CM

## 2022-09-14 DIAGNOSIS — K5901 Slow transit constipation: Secondary | ICD-10-CM

## 2022-09-14 NOTE — Progress Notes (Signed)
Therapist, nutritional Palliative Care Consult Note Telephone: [phone]  Fax: [phone]      PATIENT NAME: Cindy Graham [address] [place] [address]   [phone] (home)  DOB: 1921-07-26 MRN: [id number] PRIMARY CARE PROVIDER:    [facility],  [address] [place] [address] [phone]  REFERRING PROVIDER:   Christoper Allegra, NP   RESPONSIBLE PARTY:    Contact Information     Name Relation Home Work Mobile   Farin, Buhman [phone]  [phone]   June Leap Daughter [phone]          I met face to face with patient in the facility. Palliative Care was asked to follow this patient by consultation request of  Christoper Allegra, NP to address advance care planning and complex medical decision making. This is a follow up visit. NP called Jesusita Oka and could not leave a voicemail because voicemail not set up. Visit consisted of counseling and education dealing with the complex and emotionally intense issues of symptom management and palliative care in the setting of serious and potentially life-threatening illness. Palliative care team will continue to support patient, patient's family, and medical team.                                   ASSESSMENT AND PLAN / RECOMMENDATIONS:   Advance Care Planning/Goals of Care: Goals include to maximize quality of life and symptom management. Patient/health care surrogate gave his/her permission to discuss. CODE STATUS: DNR  Symptom Management/Plan:  Diastolic CHF-stable, no edema, weight fairly stable, current weight 130 pounds from 08/22/22, 07/08/2022 is 131 pounds 07/22/2022 is 131 pounds.  Continue compression stockings to BLE. Monitor for symptoms of edema, shortness of breath.  Monitor weight and report weight gain of 2 pounds in a day or 5 pounds in a week. Generalized weakness: Home health PT/OT in process.  Patient with gait instability and  history of fall.   Provide additional assistance with adl's as needed.  Fall precautions.  Pain- chronic pain in right knee, left shoulder. Continue Nucynta 50 mg TID, gabapentin 200 mg daily, and Tylenol as needed. Constipation: Docusate sodium 50 mg daily.  Follow up Palliative Care Visit: Palliative care will continue to follow for complex medical decision making, advance care planning, and clarification of goals. Return in 8 weeks or prn.  PPS: 40%  HOSPICE ELIGIBILITY/DIAGNOSIS: TBD  Chief Complaint: Palliative Medicine follow up visit.   HISTORY OF PRESENT ILLNESS:  [name] is a 89 y.o. year old female  with DDD, lumbar and cervical radiculopathy, arthritis, anxiety, asthma, COPD, GERD, hypertension, hyperlipidemia, melanoma.  Patient endorses occasional pain in right knee and reports well-managed with current pain regimen; no pain during visit.  Patient reports doing well overall. Nursing reports home health nursing PT/OT service is in process for patient related; nursing with no concerns. History obtained from review of EMR, discussion with primary team, and interview with family, facility staff/caregiver and/or Ms. Cassaro.   All 10 point systems reviewed and are negative except as documented in history of present illness above  I reviewed available labs, medications, imaging, studies and related documents from the EMR.  Records reviewed and summarized above.    I spent 40 minutes providing this consultation; this includes time spent with patient/family, chart review and documentation. More than 50% of the time in this consultation was spent on counseling  and coordinating communication. Thank you for the opportunity to participate in the care of Ms. Vassell. Please call our office at [phone] if we can be of additional assistance.   Note: Portions of this note were generated with Scientist, clinical (histocompatibility and immunogenetics). Dictation errors may occur despite best attempts at proofreading.     Rosaura Carpenter, NP

## 2023-02-26 ENCOUNTER — Observation Stay
Admission: EM | Admit: 2023-02-26 | Discharge: 2023-02-28 | Disposition: A | Discharge status: Home / Self Care | Payer: Medicare Other | Attending: Internal Medicine | Admitting: Internal Medicine

## 2023-02-26 ENCOUNTER — Emergency Department: Payer: Medicare Other

## 2023-02-26 ENCOUNTER — Other Ambulatory Visit: Payer: Self-pay

## 2023-02-26 DIAGNOSIS — Z7982 Long term (current) use of aspirin: Secondary | ICD-10-CM | POA: Insufficient documentation

## 2023-02-26 DIAGNOSIS — W050XXA Fall from non-moving wheelchair, initial encounter: Secondary | ICD-10-CM | POA: Diagnosis not present

## 2023-02-26 DIAGNOSIS — Z79899 Other long term (current) drug therapy: Secondary | ICD-10-CM | POA: Insufficient documentation

## 2023-02-26 DIAGNOSIS — J45909 Unspecified asthma, uncomplicated: Secondary | ICD-10-CM | POA: Diagnosis not present

## 2023-02-26 DIAGNOSIS — W19XXXA Unspecified fall, initial encounter: Principal | ICD-10-CM | POA: Diagnosis present

## 2023-02-26 DIAGNOSIS — M5416 Radiculopathy, lumbar region: Secondary | ICD-10-CM

## 2023-02-26 DIAGNOSIS — J449 Chronic obstructive pulmonary disease, unspecified: Secondary | ICD-10-CM | POA: Insufficient documentation

## 2023-02-26 DIAGNOSIS — Z85828 Personal history of other malignant neoplasm of skin: Secondary | ICD-10-CM | POA: Insufficient documentation

## 2023-02-26 DIAGNOSIS — I110 Hypertensive heart disease with heart failure: Secondary | ICD-10-CM | POA: Insufficient documentation

## 2023-02-26 DIAGNOSIS — I5030 Unspecified diastolic (congestive) heart failure: Secondary | ICD-10-CM | POA: Insufficient documentation

## 2023-02-26 DIAGNOSIS — S300XXA Contusion of lower back and pelvis, initial encounter: Secondary | ICD-10-CM | POA: Diagnosis not present

## 2023-02-26 DIAGNOSIS — E871 Hypo-osmolality and hyponatremia: Secondary | ICD-10-CM | POA: Diagnosis not present

## 2023-02-26 DIAGNOSIS — I10 Essential (primary) hypertension: Secondary | ICD-10-CM

## 2023-02-26 DIAGNOSIS — T148XXA Other injury of unspecified body region, initial encounter: Secondary | ICD-10-CM

## 2023-02-26 DIAGNOSIS — D649 Anemia, unspecified: Secondary | ICD-10-CM | POA: Insufficient documentation

## 2023-02-26 LAB — CBC
HCT: 30.3 % — ABNORMAL LOW (ref 36.0–46.0)
Hemoglobin: 9.8 g/dL — ABNORMAL LOW (ref 12.0–15.0)
MCH: 31.3 pg (ref 26.0–34.0)
MCHC: 32.3 g/dL (ref 30.0–36.0)
MCV: 96.8 fL (ref 80.0–100.0)
Platelets: 235 10*3/uL (ref 150–400)
RBC: 3.13 MIL/uL — ABNORMAL LOW (ref 3.87–5.11)
RDW: 14.5 % (ref 11.5–15.5)
WBC: 7.3 10*3/uL (ref 4.0–10.5)
nRBC: 0 % (ref 0.0–0.2)

## 2023-02-26 LAB — BASIC METABOLIC PANEL
Anion gap: 11 (ref 5–15)
BUN: 29 mg/dL — ABNORMAL HIGH (ref 8–23)
CO2: 25 mmol/L (ref 22–32)
Calcium: 8.7 mg/dL — ABNORMAL LOW (ref 8.9–10.3)
Chloride: 97 mmol/L — ABNORMAL LOW (ref 98–111)
Creatinine, Ser: 1 mg/dL (ref 0.44–1.00)
GFR, Estimated: 51 mL/min — ABNORMAL LOW (ref >60)
Glucose, Bld: 157 mg/dL — ABNORMAL HIGH (ref 70–99)
Potassium: 3.9 mmol/L (ref 3.5–5.1)
Sodium: 133 mmol/L — ABNORMAL LOW (ref 135–145)

## 2023-02-26 LAB — PROTIME-INR
INR: 1.1 (ref 0.8–1.2)
Prothrombin Time: 14.3 s (ref 11.4–15.2)

## 2023-02-26 MED ORDER — SERTRALINE HCL 50 MG PO TABS
100.0000 mg | ORAL_TABLET | Freq: Every day | ORAL | Status: DC
  Administered 2023-02-27 – 2023-02-28 (×2): 100 mg via ORAL
  Filled 2023-02-26 (×2): qty 2

## 2023-02-26 MED ORDER — VERAPAMIL HCL ER 240 MG PO TBCR
240.0000 mg | EXTENDED_RELEASE_TABLET | Freq: Every day | ORAL | Status: DC
  Administered 2023-02-26 – 2023-02-27 (×2): 240 mg via ORAL
  Filled 2023-02-26 (×2): qty 1

## 2023-02-26 MED ORDER — ACETAMINOPHEN 325 MG PO TABS
650.0000 mg | ORAL_TABLET | Freq: Once | ORAL | Status: AC
  Administered 2023-02-26: 650 mg via ORAL
  Filled 2023-02-26: qty 2

## 2023-02-26 MED ORDER — ONDANSETRON HCL 4 MG PO TABS: 4.0000 mg | ORAL_TABLET | Freq: Four times a day (QID) | ORAL | Status: DC | PRN

## 2023-02-26 MED ORDER — PROPRANOLOL HCL 40 MG PO TABS
40.0000 mg | ORAL_TABLET | Freq: Two times a day (BID) | ORAL | Status: DC
  Administered 2023-02-26 – 2023-02-27 (×3): 40 mg via ORAL
  Filled 2023-02-26: qty 1
  Filled 2023-02-26: qty 2
  Filled 2023-02-26 (×2): qty 1

## 2023-02-26 MED ORDER — ADULT MULTIVITAMIN W/MINERALS CH
1.0000 | ORAL_TABLET | Freq: Every day | ORAL | Status: DC
  Administered 2023-02-27 – 2023-02-28 (×2): 1 via ORAL
  Filled 2023-02-26 (×2): qty 1

## 2023-02-26 MED ORDER — GUAIFENESIN 100 MG/5ML PO LIQD: 200.0000 mg | Freq: Four times a day (QID) | ORAL | Status: DC | PRN

## 2023-02-26 MED ORDER — NITROGLYCERIN 0.4 MG SL SUBL: 0.4000 mg | SUBLINGUAL_TABLET | SUBLINGUAL | Status: DC | PRN

## 2023-02-26 MED ORDER — FLUTICASONE PROPIONATE 50 MCG/ACT NA SUSP
2.0000 | Freq: Every day | NASAL | Status: DC
  Administered 2023-02-27 – 2023-02-28 (×2): 2 via NASAL
  Filled 2023-02-26 (×2): qty 16

## 2023-02-26 MED ORDER — ACETAMINOPHEN 650 MG RE SUPP: 650.0000 mg | Freq: Four times a day (QID) | RECTAL | Status: DC | PRN

## 2023-02-26 MED ORDER — MAGNESIUM HYDROXIDE 400 MG/5ML PO SUSP: 30.0000 mL | Freq: Every day | ORAL | Status: DC | PRN

## 2023-02-26 MED ORDER — TAPENTADOL HCL 50 MG PO TABS
50.0000 mg | ORAL_TABLET | Freq: Three times a day (TID) | ORAL | Status: DC
  Administered 2023-02-26 – 2023-02-28 (×4): 50 mg via ORAL
  Filled 2023-02-26 (×6): qty 1

## 2023-02-26 MED ORDER — PSYLLIUM 95 % PO PACK
1.0000 | PACK | Freq: Every day | ORAL | Status: DC
  Filled 2023-02-26 (×3): qty 1

## 2023-02-26 MED ORDER — ALBUTEROL SULFATE (2.5 MG/3ML) 0.083% IN NEBU: 2.5000 mg | INHALATION_SOLUTION | RESPIRATORY_TRACT | Status: DC | PRN

## 2023-02-26 MED ORDER — SENNA 8.6 MG PO TABS
1.0000 | ORAL_TABLET | Freq: Every day | ORAL | Status: DC
  Administered 2023-02-27: 8.6 mg via ORAL
  Filled 2023-02-26: qty 1

## 2023-02-26 MED ORDER — GABAPENTIN 100 MG PO CAPS: 200.0000 mg | ORAL_CAPSULE | Freq: Every day | ORAL | Status: DC

## 2023-02-26 MED ORDER — ENALAPRIL MALEATE 10 MG PO TABS
20.0000 mg | ORAL_TABLET | Freq: Every day | ORAL | Status: DC
  Administered 2023-02-27: 20 mg via ORAL
  Filled 2023-02-26 (×2): qty 2
  Filled 2023-02-26 (×2): qty 1

## 2023-02-26 MED ORDER — PANTOPRAZOLE SODIUM 40 MG PO TBEC
40.0000 mg | DELAYED_RELEASE_TABLET | Freq: Every day | ORAL | Status: DC
  Administered 2023-02-27 – 2023-02-28 (×2): 40 mg via ORAL
  Filled 2023-02-26 (×2): qty 1

## 2023-02-26 MED ORDER — SODIUM CHLORIDE 0.9 % IV SOLN: INTRAVENOUS | Status: DC

## 2023-02-26 MED ORDER — ENSURE ENLIVE PO LIQD
237.0000 mL | Freq: Two times a day (BID) | ORAL | Status: DC
  Administered 2023-02-27 – 2023-02-28 (×3): 237 mL via ORAL

## 2023-02-26 MED ORDER — LOPERAMIDE HCL 2 MG PO CAPS: 2.0000 mg | ORAL_CAPSULE | Freq: Four times a day (QID) | ORAL | Status: DC | PRN

## 2023-02-26 MED ORDER — LACTULOSE 10 GM/15ML PO SOLN: 10.0000 g | Freq: Every day | ORAL | Status: DC | PRN

## 2023-02-26 MED ORDER — ONDANSETRON HCL 4 MG/2ML IJ SOLN: 4.0000 mg | Freq: Four times a day (QID) | INTRAMUSCULAR | Status: DC | PRN

## 2023-02-26 MED ORDER — TRAZODONE HCL 50 MG PO TABS
25.0000 mg | ORAL_TABLET | Freq: Every evening | ORAL | Status: DC | PRN
  Administered 2023-02-27: 25 mg via ORAL
  Filled 2023-02-26: qty 1

## 2023-02-26 MED ORDER — SALINE SPRAY 0.65 % NA SOLN: 1.0000 | Freq: Two times a day (BID) | NASAL | Status: DC | PRN

## 2023-02-26 MED ORDER — POLYETHYLENE GLYCOL 3350 17 G PO PACK
17.0000 g | PACK | Freq: Every day | ORAL | Status: DC
  Administered 2023-02-27 – 2023-02-28 (×2): 17 g via ORAL
  Filled 2023-02-26 (×2): qty 1

## 2023-02-26 MED ORDER — ALUM & MAG HYDROXIDE-SIMETH 200-200-20 MG/5ML PO SUSP: 30.0000 mL | Freq: Three times a day (TID) | ORAL | Status: DC | PRN

## 2023-02-26 MED ORDER — ESTROGENS CONJUGATED 0.625 MG/GM VA CREA: 0.6250 g | TOPICAL_CREAM | VAGINAL | Status: DC

## 2023-02-26 MED ORDER — ALBUTEROL SULFATE HFA 108 (90 BASE) MCG/ACT IN AERS: 2.0000 | INHALATION_SPRAY | RESPIRATORY_TRACT | Status: DC | PRN

## 2023-02-26 MED ORDER — MIRABEGRON ER 25 MG PO TB24
25.0000 mg | ORAL_TABLET | Freq: Every day | ORAL | Status: DC
  Administered 2023-02-27 – 2023-02-28 (×2): 25 mg via ORAL
  Filled 2023-02-26 (×3): qty 1

## 2023-02-26 MED ORDER — ACETAMINOPHEN 325 MG PO TABS
650.0000 mg | ORAL_TABLET | Freq: Four times a day (QID) | ORAL | Status: DC | PRN
  Administered 2023-02-27: 650 mg via ORAL
  Filled 2023-02-26: qty 2

## 2023-02-26 NOTE — ED Notes (Signed)
Labs sent down with save tube labels

## 2023-02-26 NOTE — Assessment & Plan Note (Signed)
-   This could be related to mild hypovolemia. - She will be hydrated with IV normal saline and will follow BMP.

## 2023-02-26 NOTE — H&P (Signed)
[place]   PATIENT NAME: Cindy Graham    MR#:  [id number]  DATE OF BIRTH:  [birth year]  DATE OF ADMISSION:  02/26/2023  PRIMARY CARE PHYSICIAN: [facility]   Patient is coming from: Home  REQUESTING/REFERRING PHYSICIAN: Sharyn Creamer, MD  CHIEF COMPLAINT:   Chief Complaint  Patient presents with   Fall    HISTORY OF PRESENT ILLNESS:  Cindy Graham is a 89 y.o. Caucasian female with medical history significant for COPD, GERD, hypertension, diastolic CHF dyslipidemia, anxiety, lumbar radiculopathy, asthma and osteoarthritis, who presented to the emergency room with acute onset of accidental mechanical fall between her wheelchair and her bed with subsequent left hip pain.  No presyncope or syncope.  She admitted to hitting her head though.  She fell on her left hip and had subsequent swelling and tenderness with pain.  She does not walk at baseline as she is bed to wheelchair mobility wise.  No paresthesias or focal muscle weakness.  No chest pain or palpitations.  No nausea or vomiting or abdominal pain.  No cough or wheezing or dyspnea.  No other bleeding diathesis.  ED Course: When the patient came to the ER, BP was 154/55 with otherwise normal vital signs.  Labs revealed mild hyponatremia 133 and hypochloremia of 97 with a BUN of 29 and creatinine 1, calcium 8.7 and blood glucose 157.  CBC showed hemoglobin of 9.8 and hematocrit 30.3 compared to 10.2 and 32.1 on 11/05/2021.  EKG as reviewed by me : EKG showed normal sinus rhythm and rate of 69 with short PR interval and left axis deviation. Imaging: Right finger x-ray showed severe polyarticular degenerative arthritis and no acute fracture or dislocation.  Left shoulder x-ray showed degenerative arthritis with no acute fracture or dislocation.  Left hip CT without contrast revealed the following: 1. No acute fracture or dislocation. 2. Left lateral gluteal soft tissue 13.3 cm hematoma and contusion.  The patient  was given 650 mg of p.o. Tylenol.  She will be admitted to an observation medical bed for further evaluation and management.                                                                                                                                                                PAST MEDICAL HISTORY:   Past Medical History:  Diagnosis Date   Anxiety    Arthritis    osteo   Asthma    COPD (chronic obstructive pulmonary disease) (HCC)    GERD (gastroesophageal reflux disease)    Heart murmur    Hernia    Hyperlipidemia    Hypertension    Melanoma (HCC)    face   Syncope and collapse    Urine incontinence  PAST SURGICAL HISTORY:   Past Surgical History:  Procedure Laterality Date   ABDOMINAL HYSTERECTOMY     AMPUTATION TOE Right 06/14/2018   Procedure: AMPUTATION TOE - RIGHT 5TH;  Surgeon: Recardo Evangelist, DPM;  Location: [facility] ORS;  Service: Podiatry;  Laterality: Right;   CHOLECYSTECTOMY     HERNIA REPAIR     INTRAMEDULLARY (IM) NAIL INTERTROCHANTERIC Right 06/22/2019   Procedure: INTRAMEDULLARY (IM) NAIL INTERTROCHANTRIC;  Surgeon: Tarry Kos, MD;  Location: [facility] OR;  Service: Orthopedics;  Laterality: Right;    SOCIAL HISTORY:   Social History   Tobacco Use   Smoking status: Never   Smokeless tobacco: Never  Substance Use Topics   Alcohol use: No    FAMILY HISTORY:   Family History  Problem Relation Age of Onset   Stroke Father    Heart disease Sister    Cancer Brother        colon   Heart disease Brother    Cancer Sister        colon   Heart attack Brother    Heart attack Son    Parkinson's disease Son     DRUG ALLERGIES:   Allergies  Allergen Reactions   Statins Other (See Comments)    Very bad muscle aches   Tramadol Itching   Levaquin [Levofloxacin In D5w] Diarrhea and Other (See Comments)    Confusion, hallucinations   Neomycin-Polymyxin-Gramicidin Other (See Comments)   Penicillins     Tolerates cephalosporins Did it involve  swelling of the face/tongue/throat, SOB, or low BP? Unknown Did it involve sudden or severe rash/hives, skin peeling, or any reaction on the inside of your mouth or nose? Unknown Did you need to seek medical attention at a hospital or doctor's office? Unknown When did it last happen? Unknown If all above answers are "NO", may proceed with cephalosporin use.   Sulfa Antibiotics    Tetracyclines & Related     REVIEW OF SYSTEMS:   ROS As per history of present illness. All pertinent systems were reviewed above. Constitutional, HEENT, cardiovascular, respiratory, GI, GU, musculoskeletal, neuro, psychiatric, endocrine, integumentary and hematologic systems were reviewed and are otherwise negative/unremarkable except for positive findings mentioned above in the HPI.   MEDICATIONS AT HOME:   Prior to Admission medications   Medication Sig Start Date End Date Taking? Authorizing Provider  acetaminophen (TYLENOL) 325 MG tablet Take 650 mg by mouth 2 (two) times daily.    [provider]  acetaminophen (TYLENOL) 500 MG tablet Take 500 mg by mouth every 6 (six) hours as needed for mild pain or moderate pain.    [provider]  alendronate (FOSAMAX) 70 MG tablet Take 70 mg by mouth every 7 (seven) days. monday    [provider]  alum & mag hydroxide-simeth (MAALOX/MYLANTA) 200-200-20 MG/5ML suspension Take 30 mLs by mouth 3 (three) times daily as needed for indigestion or heartburn.     [provider]  aspirin 325 MG tablet Take 1 tablet (325 mg total) by mouth daily. 06/25/19   Regalado, Belkys A, MD  cephALEXin (KEFLEX) 500 MG capsule Take 1 capsule (500 mg total) by mouth every 6 (six) hours. 11/05/21   Hollice Espy, MD  enalapril (VASOTEC) 20 MG tablet Take 20 mg by mouth daily. 01/31/20   [provider]  estradiol (ESTRACE) 0.1 MG/GM vaginal cream Estrogen Cream Instruction Discard applicator Apply pea sized amount to tip of finger to urethra  before bed. Wash hands well after application. Use  Monday, Wednesday and Friday 01/12/21   Carman Ching, PA-C  feeding supplement, ENSURE ENLIVE, (ENSURE ENLIVE) LIQD Take 237 mLs by mouth 2 (two) times daily between meals. 06/25/19   Regalado, Prentiss Bells, MD  FLONASE ALLERGY RELIEF 50 MCG/ACT nasal spray Place 2 sprays into both nostrils daily. 08/16/21   [provider]  gabapentin (NEURONTIN) 100 MG capsule Take 2 capsules by mouth at bedtime. 10/11/21   [provider]  guaifenesin (ROBITUSSIN) 100 MG/5ML syrup Take 200 mg by mouth every 6 (six) hours as needed for cough.    [provider]  lactulose (CHRONULAC) 10 GM/15ML solution Take 15 mLs by mouth daily as needed. 10/23/21   [provider]  loperamide (IMODIUM) 2 MG capsule Take 2 mg by mouth 4 (four) times daily as needed for diarrhea or loose stools.    [provider]  magnesium hydroxide (MILK OF MAGNESIA) 400 MG/5ML suspension Take 30 mLs by mouth daily as needed for mild constipation.    [provider]  mirabegron ER (MYRBETRIQ) 25 MG TB24 tablet Take 1 tablet (25 mg total) by mouth daily. 01/12/21   Carman Ching, PA-C  Multiple Vitamin (DAILY VITES) tablet Take 1 tablet by mouth daily.    [provider]  nitroGLYCERIN (NITROSTAT) 0.4 MG SL tablet Place 0.4 mg under the tongue every 5 (five) minutes as needed for chest pain.    [provider]  nystatin cream (MYCOSTATIN) Apply 1 application  topically as needed (panniculitis). Apply to pannus    [provider]  omeprazole (PRILOSEC) 20 MG capsule TAKE 1 CAPSULE BY MOUTH EVERY DAY. 11/15/17   Glori Luis, MD  polyethylene glycol (MIRALAX / GLYCOLAX) 17 g packet Take 17 g by mouth daily. 06/26/19   Regalado, Jon Billings A, MD  PREMARIN vaginal cream Place 0.625 g vaginally. Every week on Monday, Wednesday and friday 07/30/21   [provider]  propranolol (INDERAL) 40 MG tablet Take 1  tablet (40 mg total) by mouth 2 (two) times daily. 11/05/21   Hollice Espy, MD  psyllium (REGULOID) 0.52 g capsule Take 0.52 g by mouth in the morning and at bedtime.    [provider]  senna (SENOKOT) 8.6 MG TABS tablet Take 1 tablet by mouth at bedtime.    [provider]  sertraline (ZOLOFT) 100 MG tablet TAKE ONE TABLET BY MOUTH EVERY DAY 11/15/17   Glori Luis, MD  sodium chloride (OCEAN) 0.65 % nasal spray Place 1 spray into the nose every 12 (twelve) hours as needed. 09/06/21   [provider]  tapentadol (NUCYNTA) 50 MG tablet Take 50 mg by mouth 3 (three) times daily.    [provider]  VENTOLIN HFA 108 (90 Base) MCG/ACT inhaler Inhale 2 puffs into the lungs every 4 (four) hours as needed. 12/04/20   [provider]  verapamil (VERELAN PM) 240 MG 24 hr capsule Take 240 mg by mouth at bedtime.    [provider]      VITAL SIGNS:  Blood pressure (!) 125/49, pulse 64, temperature 98.1 F (36.7 C), temperature source Oral, resp. rate 14, height 5' (1.524 m), weight 62.1 kg, SpO2 94%.  PHYSICAL EXAMINATION:  Physical Exam  GENERAL:  89 y.o.-year-old female patient lying in the bed with no acute distress.  EYES: Pupils equal, round, reactive to light and accommodation. No scleral icterus. Extraocular muscles intact.  HEENT: Head atraumatic, normocephalic. Oropharynx and nasopharynx clear.  NECK:  Supple, no jugular venous distention.  No thyroid enlargement, no tenderness.  LUNGS: Normal breath sounds bilaterally, no wheezing, rales,rhonchi or crepitation. No use of accessory muscles of respiration.  CARDIOVASCULAR: Regular rate and rhythm, S1, S2 normal. No murmurs, rubs, or gallops.  ABDOMEN: Soft, nondistended, nontender. Bowel sounds present. No organomegaly or mass.  EXTREMITIES: No pedal edema, cyanosis, or clubbing.  NEUROLOGIC: Cranial nerves II through XII are intact. Muscle strength 5/5 in all extremities.  Sensation intact. Gait not checked.  PSYCHIATRIC: The patient is alert and oriented x 3.  Normal affect and good eye contact. SKIN:  Left thigh contusion and hematoma with bluish discoloration. No other obvious rash, lesion, or ulcer.   LABORATORY PANEL:   CBC Recent Labs  Lab 02/26/23 2038  WBC 7.3  HGB 9.8*  HCT 30.3*  PLT 235   ------------------------------------------------------------------------------------------------------------------  Chemistries  Recent Labs  Lab 02/26/23 2038  NA 133*  K 3.9  CL 97*  CO2 25  GLUCOSE 157*  BUN 29*  CREATININE 1.00  CALCIUM 8.7*   ------------------------------------------------------------------------------------------------------------------  Cardiac Enzymes No results for input(s): "TROPONINI" in the last 168 hours. ------------------------------------------------------------------------------------------------------------------  RADIOLOGY:  DG Shoulder Left  Result Date: 02/26/2023 CLINICAL DATA:  Fall, left shoulder pain EXAM: LEFT SHOULDER - 2+ VIEW COMPARISON:  None Available. FINDINGS: Normal alignment. No acute fracture or dislocation. Degenerative labral calcifications noted. Moderate degenerative arthritis of the acromioclavicular articulation with inferiorly oriented spurs and joint space narrowing. Limited evaluation of the left hemithorax is unremarkable. IMPRESSION: 1. Degenerative arthritis. No acute fracture or dislocation. Electronically Signed   By: Helyn Numbers M.D.   On: 02/26/2023 21:11   DG Finger Index Right  Result Date: 02/26/2023 CLINICAL DATA:  Fall, right index finger pain EXAM: RIGHT INDEX FINGER 2+V COMPARISON:  None Available. FINDINGS: Osseous structures are mildly osteopenic. No acute fracture or dislocation. Severe degenerative arthritis with prominent osteophytic spurring noted involving the PIP and DIP joints (Heberden's and Bouchard's nodes). Degenerative changes are also noted at the base  of the thumb at the first carpometacarpal joint. Soft tissues are unremarkable. IMPRESSION: 1. No acute fracture or dislocation. 2. Severe polyarticular degenerative arthritis. Electronically Signed   By: Helyn Numbers M.D.   On: 02/26/2023 21:05   CT Hip Left Wo Contrast  Result Date: 02/26/2023 CLINICAL DATA:  Hip trauma with fracture suspected. Overlying hematoma. 89 year old female. Mechanical fall. Hip is swollen EXAM: CT OF THE LEFT HIP WITHOUT CONTRAST TECHNIQUE: Multidetector CT imaging of the left hip was performed according to the standard protocol. Multiplanar CT image reconstructions were also generated. RADIATION DOSE REDUCTION: This exam was performed according to the departmental dose-optimization program which includes automated exposure control, adjustment of the mA and/or kV according to patient size and/or use of iterative reconstruction technique. COMPARISON:  Pelvis radiographs 03/15/2022 FINDINGS: Bones/Joint/Cartilage No acute fracture or dislocation. Mild degenerative arthritis left hip. Degenerative changes both SI joints Ligaments Suboptimally assessed by CT. Muscles and Tendons Fatty atrophy in the gluteal muscles. No acute abnormality or intramuscular hematoma. Soft tissues Loosely organized hyperdense fluid in the lateral left gluteal soft tissues adjacent to the greater trochanter measuring 8.5 x 5.3 x 13.3 cm. Adjacent surrounding edema. IMPRESSION: 1. No acute fracture or dislocation. 2. Left lateral gluteal soft tissue 13.3 cm hematoma and contusion. Electronically Signed   By: Minerva Fester M.D.   On: 02/26/2023 20:59      IMPRESSION AND PLAN:  Assessment and Plan: * Fall - The patient will be admitted to a medical bed. - We will obtain  a physical therapy consult to assess her ambulation. - Pain management will be provided. - We will follow serial hemoglobins and hematocrits given her large hematoma.  Hematoma of left buttock - This is secondary to #1. -  Management as above.  Hyponatremia - This could be related to mild hypovolemia. - She will be hydrated with IV normal saline and will follow BMP.  Essential hypertension - We will continue her antihypertensive therapy.  Lumbar radiculopathy - We will continue Neurontin.  Chronic obstructive pulmonary disease (COPD) (HCC) - We will continue her inhalers.    DVT prophylaxis: SCDs. Advanced Care Planning:  Code Status: The patient is DNR and DNI. Family Communication:  The plan of care was discussed in details with the patient (and family). I answered all questions. The patient agreed to proceed with the above mentioned plan. Further management will depend upon hospital course. Disposition Plan: Back to previous home environment Consults called: none.  All the records are reviewed and case discussed with ED provider.  Status is: Observation  I certify that at the time of admission, it is my clinical judgment that the patient will require hospital care extending less than 2 midnights.                            Dispo: The patient is from: Home              Anticipated d/c is to: Home              Patient currently is not medically stable to d/c.              Difficult to place patient: No  Hannah Beat M.D on 02/26/2023 at 11:08 PM  [facility]   From 7 PM-7 AM, contact night-coverage [email or web address]  CC: Primary care physician; [facility]

## 2023-02-26 NOTE — Assessment & Plan Note (Signed)
-  We will continue her antihypertensive therapy 

## 2023-02-26 NOTE — ED Provider Notes (Signed)
[facility] Provider Note    Event Date/Time   First MD Initiated Contact with Patient 02/26/23 1827     (approximate)   History   Fall   HPI {Remember to add pertinent medical, surgical, social, and/or OB history to HPI:1} Cindy Graham is a 89 y.o. female history of diastolic CHF.  Reviewed palliative care note from April.  Also history of COPD hypertension hyperlipidemia lumbar radiculopathy     Physical Exam   Triage Vital Signs: ED Triage Vitals  Encounter Vitals Group     BP 02/26/23 1814 (!) 154/55     Systolic BP Percentile --      Diastolic BP Percentile --      Pulse Rate 02/26/23 1814 76     Resp 02/26/23 1814 12     Temp 02/26/23 1814 98.1 F (36.7 C)     Temp Source 02/26/23 1814 Oral     SpO2 02/26/23 1811 95 %     Weight 02/26/23 1816 137 lb (62.1 kg)     Height 02/26/23 1816 5' (1.524 m)     Head Circumference --      Peak Flow --      Pain Score 02/26/23 1816 4     Pain Loc --      Pain Education --      Exclude from Growth Chart --     Most recent vital signs: Vitals:   02/26/23 1811 02/26/23 1814  BP:  (!) 154/55  Pulse:  76  Resp:  12  Temp:  98.1 F (36.7 C)  SpO2: 95% 96%    {Only need to document appropriate and relevant physical exam:1} General: Awake, no distress. *** CV:  Good peripheral perfusion. *** Resp:  Normal effort. *** Abd:  No distention. *** Other:  ***   ED Results / Procedures / Treatments   Labs (all labs ordered are listed, but only abnormal results are displayed) Labs Reviewed - No data to display   EKG  ***   RADIOLOGY *** {USE THE WORD "INTERPRETED"!! You MUST document your own interpretation of imaging, as well as the fact that you reviewed the radiologist's report!:1}   PROCEDURES:  Critical Care performed: {CriticalCareYesNo:19197::"Yes, see critical care procedure note(s)","No"}  Procedures   MEDICATIONS ORDERED IN ED: Medications - No data to  display   IMPRESSION / MDM / ASSESSMENT AND PLAN / ED COURSE  I reviewed the triage vital signs and the nursing notes.                              Differential diagnosis includes, but is not limited to, ***  Patient's presentation is most consistent with {EM COPA:27473}  *** {If the patient is on the monitor, remove the brackets and asterisks on the sentence below and remember to document it as a Procedure as well. Otherwise delete the sentence below:1} {**The patient is on the cardiac monitor to evaluate for evidence of arrhythmia and/or significant heart rate changes.**} {Remember to include, when applicable, any/all of the following data: independent review of imaging independent review of labs (comment specifically on pertinent positives and negatives) review of specific prior hospitalizations, PCP/specialist notes, etc. discuss meds given and prescribed document any discussion with consultants (including hospitalists) any clinical decision tools you used and why (PECARN, NEXUS, etc.) did you consider admitting the patient? document social determinants of health affecting patient's care (homelessness, inability to follow up in a  timely fashion, etc) document any pre-existing conditions increasing risk on current visit (e.g. diabetes and HTN increasing danger of high-risk chest pain/ACS) describes what meds you gave (especially parenteral) and why any other interventions?:1}     FINAL CLINICAL IMPRESSION(S) / ED DIAGNOSES   Final diagnoses:  None     Rx / DC Orders   ED Discharge Orders     None        Note:  This document was prepared using Dragon voice recognition software and may include unintentional dictation errors.

## 2023-02-26 NOTE — Assessment & Plan Note (Signed)
-   We will continue Neurontin. ?

## 2023-02-26 NOTE — Assessment & Plan Note (Signed)
-   The patient will be admitted to a medical bed. - We will obtain a physical therapy consult to assess her ambulation. - Pain management will be provided. - We will follow serial hemoglobins and hematocrits given her large hematoma.

## 2023-02-26 NOTE — ED Triage Notes (Signed)
89 yo female arrives via [facility] unwitnessed mechanical fall, trying to get out of her recliner and  No LOC, No blood thinners L. Hip is swollen, denies pain with [facility]. No rotation VS: HR 73, 154/76, 95% RA, A+Ox4, no pain  DNR/MOSTform

## 2023-02-26 NOTE — Assessment & Plan Note (Signed)
-   We will continue her inhalers. 

## 2023-02-26 NOTE — Assessment & Plan Note (Signed)
-   This is secondary to #1. - Management as above.

## 2023-02-27 ENCOUNTER — Encounter: Payer: Self-pay | Admitting: Family Medicine

## 2023-02-27 DIAGNOSIS — E871 Hypo-osmolality and hyponatremia: Secondary | ICD-10-CM | POA: Diagnosis not present

## 2023-02-27 DIAGNOSIS — S300XXA Contusion of lower back and pelvis, initial encounter: Secondary | ICD-10-CM | POA: Diagnosis not present

## 2023-02-27 DIAGNOSIS — W19XXXA Unspecified fall, initial encounter: Secondary | ICD-10-CM | POA: Diagnosis not present

## 2023-02-27 DIAGNOSIS — I10 Essential (primary) hypertension: Secondary | ICD-10-CM | POA: Diagnosis not present

## 2023-02-27 LAB — CBC
HCT: 27.4 % — ABNORMAL LOW (ref 36.0–46.0)
Hemoglobin: 8.8 g/dL — ABNORMAL LOW (ref 12.0–15.0)
MCH: 31.3 pg (ref 26.0–34.0)
MCHC: 32.1 g/dL (ref 30.0–36.0)
MCV: 97.5 fL (ref 80.0–100.0)
Platelets: 218 10*3/uL (ref 150–400)
RBC: 2.81 MIL/uL — ABNORMAL LOW (ref 3.87–5.11)
RDW: 14.5 % (ref 11.5–15.5)
WBC: 7.8 10*3/uL (ref 4.0–10.5)
nRBC: 0 % (ref 0.0–0.2)

## 2023-02-27 LAB — BASIC METABOLIC PANEL
Anion gap: 7 (ref 5–15)
BUN: 25 mg/dL — ABNORMAL HIGH (ref 8–23)
CO2: 25 mmol/L (ref 22–32)
Calcium: 7.9 mg/dL — ABNORMAL LOW (ref 8.9–10.3)
Chloride: 105 mmol/L (ref 98–111)
Creatinine, Ser: 0.87 mg/dL (ref 0.44–1.00)
GFR, Estimated: 60 mL/min — ABNORMAL LOW (ref >60)
Glucose, Bld: 90 mg/dL (ref 70–99)
Potassium: 4.3 mmol/L (ref 3.5–5.1)
Sodium: 137 mmol/L (ref 135–145)

## 2023-02-27 NOTE — Evaluation (Signed)
Physical Therapy Evaluation Patient Details Name: Cindy Graham MRN: [id number] DOB: 1922-02-12 Today's Date: 02/27/2023  History of Present Illness  Pt is a 89 y.o. Caucasian female with medical history significant for COPD, GERD, hypertension, diastolic CHF dyslipidemia, anxiety, lumbar radiculopathy, asthma and osteoarthritis, who presented to the emergency room with acute onset of accidental mechanical fall between her wheelchair and her bed with subsequent left hip pain.  No presyncope or syncope.   Clinical Impression  Pt is a 89 year old female in for recent falls, admitted 02/26/2023. From the fall yesterday, pt endured left shoulder and hip pain without the presence of fx. Pt received in bed and agreed to PT session. Pt transferred to EOB with MinA with mentions of Left shoulder discomfort. With the use of a RW (2 wheels) pt performed STS with MinA as well as a MinA pivot transfer to get from standing to recliner to complete the session. Throughout session pt reported that they are fearful to participate as they do not want to fall again. Pt tolerated Tx well today and will continue to benefit from skilled PT sessions to improve balance, functional mobility, and confidence with activity.        If plan is discharge home, recommend the following: A lot of help with bathing/dressing/bathroom;A little help with walking and/or transfers;Assistance with cooking/housework;Assist for transportation;Help with stairs or ramp for entrance   Can travel by private vehicle        Equipment Recommendations Rolling walker (2 wheels)  Recommendations for Other Services       Functional Status Assessment Patient has had a recent decline in their functional status and demonstrates the ability to make significant improvements in function in a reasonable and predictable amount of time.     Precautions / Restrictions Precautions Precautions: Fall Precaution Comments: L hip  contusion Restrictions Weight Bearing Restrictions: No      Mobility  Bed Mobility Overal bed mobility: Needs Assistance Bed Mobility: Supine to Sit     Supine to sit: Min assist     General bed mobility comments: Pt performed supine to sit EOB with Min A. Chuck pad shifts forward were necessary to ensure pt's feet were touching the floor prior to performing STS with RW (2 wheels).    Transfers Overall transfer level: Needs assistance Equipment used: Rolling walker (2 wheels) Transfers: Sit to/from Stand Sit to Stand: Min assist           General transfer comment: Pt used a RW (2 wheels) with Min A to perform a pivot transfer to the recliner from standing at EOB.    Ambulation/Gait Ambulation/Gait assistance: Min assist Gait Distance (Feet): 5 Feet Assistive device: Rolling walker (2 wheels) Gait Pattern/deviations: Narrow base of support, Trunk flexed, Knee flexed in stance - left, Knee flexed in stance - right, Step-to pattern       General Gait Details: Pt transferred from standing EOB with RW (2 wheels) to recliner.  Stairs            Wheelchair Mobility     Tilt Bed    Modified Rankin (Stroke Patients Only)       Balance Overall balance assessment: Needs assistance Sitting-balance support: Bilateral upper extremity supported, Feet supported Sitting balance-Leahy Scale: Fair     Standing balance support: Bilateral upper extremity supported, Reliant on assistive device for balance Standing balance-Leahy Scale: Fair  Pertinent Vitals/Pain Pain Assessment Pain Assessment: Faces Faces Pain Scale: Hurts a little bit Pain Location: L hip Pain Descriptors / Indicators: Aching, Sore Pain Intervention(s): Monitored during session, Premedicated before session, Repositioned    Home Living Family/patient expects to be discharged to:: Assisted living                 Home Equipment: Wheelchair Teacher, English as a foreign language (4 wheels) Additional Comments: LTC resident at [facility]    Prior Function Prior Level of Function : Needs assist;History of Falls (last six months)       Physical Assist : Mobility (physical) Mobility (physical): Transfers;Gait   Mobility Comments: Pt reports sup A for SPT to w/c; self-propulsion in w/c ADLs Comments: Pt reports requiring assistance with IADL/ADLs (cooking, cleaning, showering, dressing) from staff; able to self-feed with assist     Extremity/Trunk Assessment   Upper Extremity Assessment Upper Extremity Assessment: LUE deficits/detail;Overall [facility] for tasks assessed;Generalized weakness LUE Deficits / Details: Left shoulder has audible and palpable crepitus through the joint    Lower Extremity Assessment Lower Extremity Assessment: Overall WFL for tasks assessed;Generalized weakness    Cervical / Trunk Assessment Cervical / Trunk Assessment: Kyphotic  Communication   Communication Communication: Other (comment) Cueing Techniques: Verbal cues;Tactile cues  Cognition Arousal: Alert Behavior During Therapy: WFL for tasks assessed/performed Overall Cognitive Status: Within Functional Limits for tasks assessed                                 General Comments: AO x4; Pleasant and cooperative with PT session        General Comments      Exercises     Assessment/Plan    PT Assessment Patient needs continued PT services  PT Problem List Decreased strength;Decreased range of motion;Decreased activity tolerance;Decreased balance;Decreased mobility;Decreased knowledge of use of DME;Pain       PT Treatment Interventions Functional mobility training;Therapeutic activities;Balance training    PT Goals (Current goals can be found in the Care Plan section)  Acute Rehab PT Goals Patient Stated Goal: Go back to Assisted Living Community following d/c PT Goal Formulation: With patient Time For Goal Achievement:  03/13/23 Potential to Achieve Goals: Good    Frequency Min 1X/week     Co-evaluation               AM-PAC PT "6 Clicks" Mobility  Outcome Measure Help needed turning from your back to your side while in a flat bed without using bedrails?: A Little Help needed moving from lying on your back to sitting on the side of a flat bed without using bedrails?: A Little Help needed moving to and from a bed to a chair (including a wheelchair)?: A Little Help needed standing up from a chair using your arms (e.g., wheelchair or bedside chair)?: A Little Help needed to walk in hospital room?: A Little Help needed climbing 3-5 steps with a railing? : Total 6 Click Score: 16    End of Session Equipment Utilized During Treatment: Gait belt Activity Tolerance: Patient tolerated treatment well Patient left: in chair;with call bell/phone within reach;with family/visitor present;with chair alarm set Nurse Communication: Mobility status PT Visit Diagnosis: Unsteadiness on feet (R26.81);Repeated falls (R29.6);Muscle weakness (generalized) (M62.81);History of falling (Z91.81);Difficulty in walking, not elsewhere classified (R26.2);Pain Pain - Right/Left: Left Pain - part of body: Shoulder;Hip    Time: 1610-9604 PT Time Calculation (min) (ACUTE ONLY): 24 min   Charges:  PT Evaluation $PT Eval Low Complexity: 1 Low PT Treatments $Gait Training: 8-22 mins PT General Charges $$ ACUTE PT VISIT: 1 Visit        Deanette Tullius Sauvignon Howard SPT, LAT, ATC   Collins Kerby Sauvignon-Howard 02/27/2023, 4:59 PM

## 2023-02-27 NOTE — Progress Notes (Signed)
[facility]  - [place] at [facility]   PATIENT NAME: Cindy Graham    MR#:  [id number]  DATE OF BIRTH:  September 04, 1921  SUBJECTIVE:  No family at bedside Spoke with dter Cindy Graham Came in after accidental fall and got hematoma on left hip. Mild pain Was able to work with PT    VITALS:  Blood pressure (!) 166/60, pulse 60, temperature 98.3 F (36.8 C), resp. rate 18, height 5' (1.524 m), weight 59.9 kg, SpO2 98%.  PHYSICAL EXAMINATION:   GENERAL:  89 y.o.-year-old patient with no acute distress.  LUNGS: Normal breath sounds bilaterally, no wheezing CARDIOVASCULAR: S1, S2 normal. No murmur   ABDOMEN: Soft, nontender, nondistended. Bowel sounds present.  EXTREMITIES: left hip hematoma NEUROLOGIC: nonfocal  patient is alert and awake   LABORATORY PANEL:  CBC Recent Labs  Lab 02/27/23 0438  WBC 7.8  HGB 8.8*  HCT 27.4*  PLT 218    Chemistries  Recent Labs  Lab 02/27/23 0546  NA 137  K 4.3  CL 105  CO2 25  GLUCOSE 90  BUN 25*  CREATININE 0.87  CALCIUM 7.9*   Cardiac Enzymes No results for input(s): "TROPONINI" in the last 168 hours. RADIOLOGY:  DG Shoulder Left  Result Date: 02/26/2023 CLINICAL DATA:  Fall, left shoulder pain EXAM: LEFT SHOULDER - 2+ VIEW COMPARISON:  None Available. FINDINGS: Normal alignment. No acute fracture or dislocation. Degenerative labral calcifications noted. Moderate degenerative arthritis of the acromioclavicular articulation with inferiorly oriented spurs and joint space narrowing. Limited evaluation of the left hemithorax is unremarkable. IMPRESSION: 1. Degenerative arthritis. No acute fracture or dislocation. Electronically Signed   By: Helyn Numbers M.D.   On: 02/26/2023 21:11   DG Finger Index Right  Result Date: 02/26/2023 CLINICAL DATA:  Fall, right index finger pain EXAM: RIGHT INDEX FINGER 2+V COMPARISON:  None Available. FINDINGS: Osseous structures are mildly osteopenic. No acute fracture or dislocation. Severe  degenerative arthritis with prominent osteophytic spurring noted involving the PIP and DIP joints (Heberden's and Bouchard's nodes). Degenerative changes are also noted at the base of the thumb at the first carpometacarpal joint. Soft tissues are unremarkable. IMPRESSION: 1. No acute fracture or dislocation. 2. Severe polyarticular degenerative arthritis. Electronically Signed   By: Helyn Numbers M.D.   On: 02/26/2023 21:05   CT Hip Left Wo Contrast  Result Date: 02/26/2023 CLINICAL DATA:  Hip trauma with fracture suspected. Overlying hematoma. 89 year old female. Mechanical fall. Hip is swollen EXAM: CT OF THE LEFT HIP WITHOUT CONTRAST TECHNIQUE: Multidetector CT imaging of the left hip was performed according to the standard protocol. Multiplanar CT image reconstructions were also generated. RADIATION DOSE REDUCTION: This exam was performed according to the departmental dose-optimization program which includes automated exposure control, adjustment of the mA and/or kV according to patient size and/or use of iterative reconstruction technique. COMPARISON:  Pelvis radiographs 03/15/2022 FINDINGS: Bones/Joint/Cartilage No acute fracture or dislocation. Mild degenerative arthritis left hip. Degenerative changes both SI joints Ligaments Suboptimally assessed by CT. Muscles and Tendons Fatty atrophy in the gluteal muscles. No acute abnormality or intramuscular hematoma. Soft tissues Loosely organized hyperdense fluid in the lateral left gluteal soft tissues adjacent to the greater trochanter measuring 8.5 x 5.3 x 13.3 cm. Adjacent surrounding edema. IMPRESSION: 1. No acute fracture or dislocation. 2. Left lateral gluteal soft tissue 13.3 cm hematoma and contusion. Electronically Signed   By: Minerva Fester M.D.   On: 02/26/2023 20:59    Assessment and Plan Cindy Graham is a  89 y.o. Caucasian female with medical history significant for COPD, GERD, hypertension, diastolic CHF dyslipidemia, anxiety, lumbar  radiculopathy, asthma and osteoarthritis, who presented to the emergency room with acute onset of accidental mechanical fall between her wheelchair and her bed with subsequent left hip pain.   Right finger x-ray showed severe polyarticular degenerative arthritis and no acute fracture or dislocation.   Left shoulder x-ray showed degenerative arthritis with no acute fracture or dislocation.   Left hip CT without contrast revealed the following: 1. No acute fracture or dislocation. 2. Left lateral gluteal soft tissue 13.3 cm hematoma and contusion.  Fall, accidental  Hematoma of left buttock - patient is bedbound and transfers herself to the wheelchair. She is from [employer]. Does not remember how she slid off the recliner.. - Came in with hemoglobin 9.8-- 8.8 -- will check hemoglobin in the morning. PRN pain meds -- patient not on any blood thinners -- worked with physical therapy. Recommends home health   Hyponatremia - could be related to mild hypovolemia. Sodium much improved  Essential hypertension - continue her antihypertensive therapy.   Lumbar radiculopathy -  continue Neurontin.   Chronic obstructive pulmonary disease (COPD) (HCC) - continue her inhalers.   Procedures: Family communication : spoke with patient's daughter Cindy Graham tab on the phone Consults : none CODE STATUS: DNR DVT Prophylaxis : SCD Level of care: Med-Surg Status is: Observation The patient remains OBS appropriate and will d/c before 2 midnights.    TOTAL TIME TAKING CARE OF THIS PATIENT: 35 minutes.  >50% time spent on counselling and coordination of care  Note: This dictation was prepared with Dragon dictation along with smaller phrase technology. Any transcriptional errors that result from this process are unintentional.  Enedina Finner M.D    [facility]   CC: Primary care physician; [facility]

## 2023-02-27 NOTE — Progress Notes (Signed)
[facility] Palliative Patient  Ms. Cindy Graham is currently followed by Civil engineer, contracting for Palliative Care at [place].  Please do not hesitate calling for any palliative related questions/concerns.  Thank you, Norris Cross, RN Nurse Liaison [phone]

## 2023-02-28 DIAGNOSIS — S300XXA Contusion of lower back and pelvis, initial encounter: Secondary | ICD-10-CM | POA: Diagnosis not present

## 2023-02-28 DIAGNOSIS — T148XXA Other injury of unspecified body region, initial encounter: Secondary | ICD-10-CM

## 2023-02-28 DIAGNOSIS — W19XXXA Unspecified fall, initial encounter: Secondary | ICD-10-CM | POA: Diagnosis not present

## 2023-02-28 DIAGNOSIS — E871 Hypo-osmolality and hyponatremia: Secondary | ICD-10-CM | POA: Diagnosis not present

## 2023-02-28 LAB — HEMOGLOBIN: Hemoglobin: 9.2 g/dL — ABNORMAL LOW (ref 12.0–15.0)

## 2023-02-28 MED ORDER — PROPRANOLOL HCL 40 MG PO TABS: 20.0000 mg | ORAL_TABLET | Freq: Two times a day (BID) | ORAL | Status: DC

## 2023-02-28 MED ORDER — VERAPAMIL HCL ER 180 MG PO CP24: 180.0000 mg | ORAL_CAPSULE | Freq: Every day | ORAL | 0 refills | Status: DC

## 2023-02-28 MED ORDER — SM ASPIRIN LOW DOSE 81 MG PO CHEW: 81.0000 mg | CHEWABLE_TABLET | Freq: Every day | ORAL | 0 refills | Status: DC

## 2023-02-28 NOTE — Care Management CC44 (Deleted)
Condition Code 44 Documentation Completed  Patient Details  Name: Cindy Graham MRN: [id number] Date of Birth: 28-Dec-1921   Condition Code 44 given:  Yes Patient signature on Condition Code 44 notice:  Yes Documentation of 2 MD's agreement:  Yes Code 44 added to claim:  Yes    Garret Reddish, RN 02/28/2023, 9:28 AM

## 2023-02-28 NOTE — Plan of Care (Signed)

## 2023-02-28 NOTE — NC FL2 (Signed)
[place] MEDICAID FL2 LEVEL OF CARE FORM     IDENTIFICATION  Patient Name: Cindy Graham Birthdate: 1922-01-15 Sex: female Admission Date (Current Location): 02/26/2023  [place] and [place] Number:  [address] [id number] [facility] Facility and Address:  [facility], [address] [place], [place] [address]      Provider Number: [id number]  Attending Physician Name and Address:  Enedina Finner, MD  Relative Name and Phone Number:  Delton Coombes [phone]    Current Level of Care: Hospital Recommended Level of Care: Assisted Living Facility Prior Approval Number:    Date Approved/Denied:   PASRR Number:    Discharge Plan:  (Assisted Living facility)    Current Diagnoses: Patient Active Problem List   Diagnosis Date Noted   Multiple skin tears 02/28/2023   Fall 02/26/2023   Hematoma of left buttock 02/26/2023   Essential hypertension 02/26/2023   Chronic obstructive pulmonary disease (COPD) (HCC) 02/26/2023   Lumbar radiculopathy 02/26/2023   Pressure injury of skin 11/03/2021   Obesity (BMI 30-39.9) 11/03/2021   Acute on chronic diastolic CHF (congestive heart failure) (HCC) 11/03/2021   Cellulitis of right lower extremity 11/02/2021   Acute cystitis with hematuria 11/02/2021   Anxiety 11/02/2021   GERD (gastroesophageal reflux disease) 11/02/2021   Neuropathy 11/02/2021   Hypokalemia 11/02/2021   Hypomagnesemia 11/02/2021   CKD (chronic kidney disease), stage III (HCC) 11/02/2021   Septic shock (HCC) 11/01/2021   Tachycardia    Sepsis (HCC) 03/17/2020   Severe sepsis (HCC) 03/16/2020   COPD (chronic obstructive pulmonary disease) (HCC)    Elevated troponin    Displaced intertrochanteric fracture of right femur, initial encounter for closed fracture (HCC)    Acute kidney injury (AKI) with acute tubular necrosis (ATN) (HCC) 02/27/2019   Stroke (cerebrum) (HCC) 02/22/2019   Cellulitis 06/12/2018   Hyponatremia 11/24/2017   Headache 11/24/2017    Wound of left leg 11/01/2017   Constipation 09/05/2017   Other fatigue 12/22/2016   Tremor 03/29/2016   Amaurosis fugax 12/25/2015   Asthma 09/17/2015   PVD (peripheral vascular disease) (HCC) 05/28/2015   B12 deficiency 08/29/2014   Chronic back pain greater than 3 months duration 04/23/2013   Acute renal failure superimposed on stage 3a chronic kidney disease (HCC) 04/12/2012   Hyperlipidemia with target low density lipoprotein (LDL) cholesterol less than 100 mg/dL 78/46/9629   UTI (urinary tract infection) 07/26/2011   Osteoarthritis 07/06/2011   Hypertension 02/21/2011   Depression/Anxiety 02/21/2011    Orientation RESPIRATION BLADDER Height & Weight     Self, Time, Situation, Place  Normal Continent Weight: 59.9 kg Height:  5' (152.4 cm)  BEHAVIORAL SYMPTOMS/MOOD NEUROLOGICAL BOWEL NUTRITION STATUS      Continent  (See Discharge Summary)  AMBULATORY STATUS COMMUNICATION OF NEEDS Skin   Extensive Assist Verbally Normal                       Personal Care Assistance Level of Assistance  Bathing, Feeding, Dressing Bathing Assistance: Maximum assistance Feeding assistance: Limited assistance Dressing Assistance: Maximum assistance     Functional Limitations Info  Sight, Hearing, Speech Sight Info: Adequate Hearing Info: Adequate Speech Info: Adequate    SPECIAL CARE FACTORS FREQUENCY                       Contractures Contractures Info: Not present    Additional Factors Info  Code Status, Allergies Code Status Info: DNR Limited Allergies Info: Statins, Tramadol, Levaquin (Levofloxacin In D5w),  Neomycin-polymyxin-gramicidin, Penicillins, Sulfa Antibiotics, Tetracyclines & Related           Current Medications (02/28/2023):  This is the current hospital active medication list Current Facility-Administered Medications  Medication Dose Route Frequency Provider Last Rate Last Admin   acetaminophen (TYLENOL) tablet 650 mg  650 mg Oral Q6H PRN Mansy,  Jan A, MD   650 mg at 02/27/23 0956   Or   acetaminophen (TYLENOL) suppository 650 mg  650 mg Rectal Q6H PRN Mansy, Jan A, MD       albuterol (PROVENTIL) (2.5 MG/3ML) 0.083% nebulizer solution 2.5 mg  2.5 mg Nebulization Q4H PRN Belue, Lendon Collar, RPH       alum & mag hydroxide-simeth (MAALOX/MYLANTA) 200-200-20 MG/5ML suspension 30 mL  30 mL Oral TID PRN Mansy, Jan A, MD       enalapril (VASOTEC) tablet 20 mg  20 mg Oral Daily Mansy, Jan A, MD   20 mg at 02/27/23 1029   feeding supplement (ENSURE ENLIVE / ENSURE PLUS) liquid 237 mL  237 mL Oral BID BM Mansy, Jan A, MD   237 mL at 02/28/23 0904   fluticasone (FLONASE) 50 MCG/ACT nasal spray 2 spray  2 spray Each Nare Daily Mansy, Jan A, MD   2 spray at 02/28/23 0904   guaiFENesin (ROBITUSSIN) 100 MG/5ML liquid 200 mg  200 mg Oral Q6H PRN Mansy, Jan A, MD       lactulose (CHRONULAC) 10 GM/15ML solution 10 g  10 g Oral Daily PRN Mansy, Jan A, MD       loperamide (IMODIUM) capsule 2 mg  2 mg Oral QID PRN Mansy, Jan A, MD       magnesium hydroxide (MILK OF MAGNESIA) suspension 30 mL  30 mL Oral Daily PRN Mansy, Jan A, MD       mirabegron ER (MYRBETRIQ) tablet 25 mg  25 mg Oral Daily Mansy, Jan A, MD   25 mg at 02/28/23 1610   multivitamin with minerals tablet 1 tablet  1 tablet Oral Daily Mansy, Jan A, MD   1 tablet at 02/28/23 0903   nitroGLYCERIN (NITROSTAT) SL tablet 0.4 mg  0.4 mg Sublingual Q5 min PRN Mansy, Jan A, MD       ondansetron [facility]) tablet 4 mg  4 mg Oral Q6H PRN Mansy, Jan A, MD       Or   ondansetron [facility]) injection 4 mg  4 mg Intravenous Q6H PRN Mansy, Jan A, MD       pantoprazole (PROTONIX) EC tablet 40 mg  40 mg Oral Daily Mansy, Jan A, MD   40 mg at 02/28/23 0903   polyethylene glycol (MIRALAX / GLYCOLAX) packet 17 g  17 g Oral Daily Mansy, Jan A, MD   17 g at 02/28/23 0903   propranolol (INDERAL) tablet 40 mg  40 mg Oral BID Mansy, Jan A, MD   40 mg at 02/27/23 2133   psyllium (HYDROCIL/METAMUCIL) 1 packet  1 packet Oral  Daily Mansy, Jan A, MD       senna (SENOKOT) tablet 8.6 mg  1 tablet Oral QHS Mansy, Jan A, MD   8.6 mg at 02/27/23 2115   sertraline (ZOLOFT) tablet 100 mg  100 mg Oral Daily Mansy, Jan A, MD   100 mg at 02/28/23 0903   sodium chloride (OCEAN) 0.65 % nasal spray 1 spray  1 spray Nasal Q12H PRN Mansy, Jan A, MD       tapentadol (NUCYNTA) tablet 50 mg  50 mg Oral TID Mansy, Jan A, MD   50 mg at 02/28/23 0903   traZODone (DESYREL) tablet 25 mg  25 mg Oral QHS PRN Mansy, Jan A, MD   25 mg at 02/27/23 2115   verapamil (CALAN-SR) CR tablet 240 mg  240 mg Oral QHS Mansy, Jan A, MD   240 mg at 02/27/23 2131     Discharge Medications: alendronate 70 MG tablet Commonly known as: FOSAMAX Take 70 mg by mouth every 7 (seven) days. monday    enalapril 20 MG tablet Commonly known as: VASOTEC Take 20 mg by mouth daily.    feeding supplement Liqd Take 237 mLs by mouth 2 (two) times daily between meals.    Flonase Allergy Relief 50 MCG/ACT nasal spray Generic drug: fluticasone Place 2 sprays into both nostrils daily.    guaifenesin 100 MG/5ML syrup Commonly known as: ROBITUSSIN Take 200 mg by mouth every 6 (six) hours as needed for cough.    lactulose 10 GM/15ML solution Commonly known as: CHRONULAC Take 15 mLs by mouth daily as needed.    Menthol (Topical Analgesic) 4 % Gel Apply 1 Application topically 3 (three) times daily as needed. Apply to affected area of legs and neck    mirabegron ER 25 MG Tb24 tablet Commonly known as: MYRBETRIQ Take 1 tablet (25 mg total) by mouth daily.    nitroGLYCERIN 0.4 MG SL tablet Commonly known as: NITROSTAT Place 0.4 mg under the tongue every 5 (five) minutes as needed for chest pain.    nystatin cream Commonly known as: MYCOSTATIN Apply 1 application  topically as needed (panniculitis). Apply to pannus    omeprazole 20 MG capsule Commonly known as: PRILOSEC TAKE 1 CAPSULE BY MOUTH EVERY DAY.    polyethylene glycol 17 g packet Commonly known  as: MIRALAX / GLYCOLAX Take 17 g by mouth daily.    propranolol 40 MG tablet Commonly known as: INDERAL Take 0.5 tablets (20 mg total) by mouth 2 (two) times daily. What changed: how much to take    Psyllium 400 MG Caps Take 0.4 g by mouth in the morning and at bedtime.    senna 8.6 MG Tabs tablet Commonly known as: SENOKOT Take 1 tablet by mouth at bedtime.    sertraline 100 MG tablet Commonly known as: ZOLOFT TAKE ONE TABLET BY MOUTH EVERY DAY    SM Aspirin Low Dose 81 MG chewable tablet Generic drug: aspirin Chew 1 tablet (81 mg total) by mouth daily. Start taking on: March 07, 2023 What changed:  These instructions start on March 07, 2023. If you are unsure what to do until then, ask your doctor or other care provider. Another medication with the same name was removed. Continue taking this medication, and follow the directions you see here.    sodium chloride 0.65 % nasal spray Commonly known as: OCEAN Place 1 spray into the nose every 2 (two) hours as needed for congestion.    tapentadol 50 MG tablet Commonly known as: NUCYNTA Take 50 mg by mouth 3 (three) times daily.    Ventolin HFA 108 (90 Base) MCG/ACT inhaler Generic drug: albuterol Inhale 2 puffs into the lungs every 4 (four) hours as needed.    verapamil 180 MG 24 hr capsule Commonly known as: VERELAN Take 1 capsule (180 mg total) by mouth at bedtime. What changed:  medication strength how much to take         Please see discharge summary for a list of discharge medications.  Relevant Imaging Results:  Relevant Lab Results:   Additional Information [id number]  Garret Reddish, RN

## 2023-02-28 NOTE — Discharge Summary (Signed)
Physician Discharge Summary   Patient: Cindy Graham MRN: [id number] DOB: 1921-08-16  Admit date:     02/26/2023  Discharge date: 02/28/23  Discharge Physician: Enedina Finner   PCP: [facility]   Recommendations at discharge:    F/u PCP in 1-2 weeks  Discharge Diagnoses: Principal Problem:   Fall Active Problems:   Hyponatremia   Hematoma of left buttock   Essential hypertension   Lumbar radiculopathy   Chronic obstructive pulmonary disease (COPD) (HCC)  Cindy Graham is a 89 y.o. Caucasian female with medical history significant for COPD, GERD, hypertension, diastolic CHF dyslipidemia, anxiety, lumbar radiculopathy, asthma and osteoarthritis, who presented to the emergency room with acute onset of accidental mechanical fall between her wheelchair and her bed with subsequent left hip pain.    Right finger x-ray showed severe polyarticular degenerative arthritis and no acute fracture or dislocation.   Left shoulder x-ray showed degenerative arthritis with no acute fracture or dislocation.   Left hip CT without contrast revealed the following: 1. No acute fracture or dislocation. 2. Left lateral gluteal soft tissue 13.3 cm hematoma and contusion.   Fall, accidental Hematoma of left buttock - patient is bedbound and transfers herself to the wheelchair. She is from [employer]. Does not remember how she slid off the recliner.. - Came in with hemoglobin 9.8-- 8.8--9.2 -- will check hemoglobin in the morning. PRN pain meds -- patient not on any blood thinners--can start ASA in a week -- worked with physical therapy. Recommends home health   Hyponatremia - could be related to mild hypovolemia. Sodium much improved   Essential hypertension - continue her antihypertensive therapy... although adjusted dosing due to soft bp --hold bp meds if SBP <115   Lumbar radiculopathy -  continue Neurontin.   Chronic obstructive pulmonary disease (COPD) (HCC) - continue her  inhalers.  Overall appears at baseline. Will d/c back to [facility] Pt is being followed with Palliative care as out pt   Family communication : spoke with patient's daughter Cindy Graham on the phone Consults : none CODE STATUS: DNR DVT Prophylaxis : SCD Level of care: Med-Surg      Disposition: Home health Diet recommendation:  Discharge Diet Orders (From admission, onward)     Start     Ordered   02/28/23 0000  Diet - low sodium heart healthy        02/28/23 0949           Cardiac diet DISCHARGE MEDICATION: Allergies as of 02/28/2023       Reactions   Statins Other (See Comments)   Very bad muscle aches   Tramadol Itching   Levaquin [levofloxacin In D5w] Diarrhea, Other (See Comments)   Confusion, hallucinations   Neomycin-polymyxin-gramicidin Other (See Comments)   Penicillins    Tolerates cephalosporins Did it involve swelling of the face/tongue/throat, SOB, or low BP? Unknown Did it involve sudden or severe rash/hives, skin peeling, or any reaction on the inside of your mouth or nose? Unknown Did you need to seek medical attention at a hospital or doctor's office? Unknown When did it last happen? Unknown If all above answers are "NO", may proceed with cephalosporin use.   Sulfa Antibiotics    Tetracyclines & Related         Medication List     STOP taking these medications    acetaminophen 325 MG tablet Commonly known as: TYLENOL   acetaminophen 500 MG tablet Commonly known as: TYLENOL   alum &  mag hydroxide-simeth 200-200-20 MG/5ML suspension Commonly known as: MAALOX/MYLANTA   cephALEXin 500 MG capsule Commonly known as: KEFLEX   Daily Vites tablet   estradiol 0.1 MG/GM vaginal cream Commonly known as: ESTRACE   gabapentin 100 MG capsule Commonly known as: NEURONTIN   loperamide 2 MG capsule Commonly known as: IMODIUM   magnesium hydroxide 400 MG/5ML suspension Commonly known as: MILK OF MAGNESIA   Premarin vaginal  cream Generic drug: conjugated estrogens       TAKE these medications    alendronate 70 MG tablet Commonly known as: FOSAMAX Take 70 mg by mouth every 7 (seven) days. monday   enalapril 20 MG tablet Commonly known as: VASOTEC Take 20 mg by mouth daily.   feeding supplement Liqd Take 237 mLs by mouth 2 (two) times daily between meals.   Flonase Allergy Relief 50 MCG/ACT nasal spray Generic drug: fluticasone Place 2 sprays into both nostrils daily.   guaifenesin 100 MG/5ML syrup Commonly known as: ROBITUSSIN Take 200 mg by mouth every 6 (six) hours as needed for cough.   lactulose 10 GM/15ML solution Commonly known as: CHRONULAC Take 15 mLs by mouth daily as needed.   Menthol (Topical Analgesic) 4 % Gel Apply 1 Application topically 3 (three) times daily as needed. Apply to affected area of legs and neck   mirabegron ER 25 MG Tb24 tablet Commonly known as: MYRBETRIQ Take 1 tablet (25 mg total) by mouth daily.   nitroGLYCERIN 0.4 MG SL tablet Commonly known as: NITROSTAT Place 0.4 mg under the tongue every 5 (five) minutes as needed for chest pain.   nystatin cream Commonly known as: MYCOSTATIN Apply 1 application  topically as needed (panniculitis). Apply to pannus   omeprazole 20 MG capsule Commonly known as: PRILOSEC TAKE 1 CAPSULE BY MOUTH EVERY DAY.   polyethylene glycol 17 g packet Commonly known as: MIRALAX / GLYCOLAX Take 17 g by mouth daily.   propranolol 40 MG tablet Commonly known as: INDERAL Take 0.5 tablets (20 mg total) by mouth 2 (two) times daily. What changed: how much to take   Psyllium 400 MG Caps Take 0.4 g by mouth in the morning and at bedtime.   senna 8.6 MG Tabs tablet Commonly known as: SENOKOT Take 1 tablet by mouth at bedtime.   sertraline 100 MG tablet Commonly known as: ZOLOFT TAKE ONE TABLET BY MOUTH EVERY DAY   SM Aspirin Low Dose 81 MG chewable tablet Generic drug: aspirin Chew 1 tablet (81 mg total) by mouth  daily. Start taking on: March 07, 2023 What changed:  These instructions start on March 07, 2023. If you are unsure what to do until then, ask your doctor or other care provider. Another medication with the same name was removed. Continue taking this medication, and follow the directions you see here.   sodium chloride 0.65 % nasal spray Commonly known as: OCEAN Place 1 spray into the nose every 2 (two) hours as needed for congestion.   tapentadol 50 MG tablet Commonly known as: NUCYNTA Take 50 mg by mouth 3 (three) times daily.   Ventolin HFA 108 (90 Base) MCG/ACT inhaler Generic drug: albuterol Inhale 2 puffs into the lungs every 4 (four) hours as needed.   verapamil 180 MG 24 hr capsule Commonly known as: VERELAN Take 1 capsule (180 mg total) by mouth at bedtime. What changed:  medication strength how much to take        Follow-up Information     [facility] Schedule an  appointment as soon as possible for a visit in 1 week(s).   Specialty: Geriatric Medicine Contact information: [address] [place] [address] [phone]                Discharge Exam: Ceasar Mons Weights   02/26/23 1816 02/27/23 0900  Weight: 62.1 kg 59.9 kg   GENERAL:  89 y.o.-year-old patient with no acute distress.  LUNGS: Normal breath sounds bilaterally, no wheezing CARDIOVASCULAR: S1, S2 normal. No murmur   ABDOMEN: Soft, nontender, nondistended. Bowel sounds present.  EXTREMITIES: left hip hematoma NEUROLOGIC: nonfocal  patient is alert and awake  Condition at discharge: fair  The results of significant diagnostics from this hospitalization (including imaging, microbiology, ancillary and laboratory) are listed below for reference.   Imaging Studies: DG Shoulder Left  Result Date: 02/26/2023 CLINICAL DATA:  Fall, left shoulder pain EXAM: LEFT SHOULDER - 2+ VIEW COMPARISON:  None Available. FINDINGS: Normal alignment. No acute fracture or  dislocation. Degenerative labral calcifications noted. Moderate degenerative arthritis of the acromioclavicular articulation with inferiorly oriented spurs and joint space narrowing. Limited evaluation of the left hemithorax is unremarkable. IMPRESSION: 1. Degenerative arthritis. No acute fracture or dislocation. Electronically Signed   By: Helyn Numbers M.D.   On: 02/26/2023 21:11   DG Finger Index Right  Result Date: 02/26/2023 CLINICAL DATA:  Fall, right index finger pain EXAM: RIGHT INDEX FINGER 2+V COMPARISON:  None Available. FINDINGS: Osseous structures are mildly osteopenic. No acute fracture or dislocation. Severe degenerative arthritis with prominent osteophytic spurring noted involving the PIP and DIP joints (Heberden's and Bouchard's nodes). Degenerative changes are also noted at the base of the thumb at the first carpometacarpal joint. Soft tissues are unremarkable. IMPRESSION: 1. No acute fracture or dislocation. 2. Severe polyarticular degenerative arthritis. Electronically Signed   By: Helyn Numbers M.D.   On: 02/26/2023 21:05   CT Hip Left Wo Contrast  Result Date: 02/26/2023 CLINICAL DATA:  Hip trauma with fracture suspected. Overlying hematoma. 89 year old female. Mechanical fall. Hip is swollen EXAM: CT OF THE LEFT HIP WITHOUT CONTRAST TECHNIQUE: Multidetector CT imaging of the left hip was performed according to the standard protocol. Multiplanar CT image reconstructions were also generated. RADIATION DOSE REDUCTION: This exam was performed according to the departmental dose-optimization program which includes automated exposure control, adjustment of the mA and/or kV according to patient size and/or use of iterative reconstruction technique. COMPARISON:  Pelvis radiographs 03/15/2022 FINDINGS: Bones/Joint/Cartilage No acute fracture or dislocation. Mild degenerative arthritis left hip. Degenerative changes both SI joints Ligaments Suboptimally assessed by CT. Muscles and Tendons Fatty  atrophy in the gluteal muscles. No acute abnormality or intramuscular hematoma. Soft tissues Loosely organized hyperdense fluid in the lateral left gluteal soft tissues adjacent to the greater trochanter measuring 8.5 x 5.3 x 13.3 cm. Adjacent surrounding edema. IMPRESSION: 1. No acute fracture or dislocation. 2. Left lateral gluteal soft tissue 13.3 cm hematoma and contusion. Electronically Signed   By: Minerva Fester M.D.   On: 02/26/2023 20:59    Microbiology: Results for orders placed or performed during the hospital encounter of 11/01/21  Urine Culture     Status: Abnormal   Collection Time: 11/01/21 11:27 AM   Specimen: Urine, Clean Catch  Result Value Ref Range Status   Specimen Description   Final    URINE, CLEAN CATCH Performed at [facility], [address], [place], [place] [address]    Special Requests   Final    NONE Performed at [facility], [address], [place], [place] [address]    Culture 50,000 COLONIES/mL KLEBSIELLA OZAENAE (A)  Final   Report Status 11/03/2021 FINAL  Final   Organism ID, Bacteria KLEBSIELLA OZAENAE (A)  Final      Susceptibility   Klebsiella ozaenae - MIC*    AMPICILLIN >=32 RESISTANT Resistant     CEFAZOLIN <=4 SENSITIVE Sensitive     CEFEPIME <=0.12 SENSITIVE Sensitive     CEFTRIAXONE <=0.25 SENSITIVE Sensitive     CIPROFLOXACIN <=0.25 SENSITIVE Sensitive     GENTAMICIN <=1 SENSITIVE Sensitive     IMIPENEM <=0.25 SENSITIVE Sensitive     NITROFURANTOIN 64 INTERMEDIATE Intermediate     TRIMETH/SULFA <=20 SENSITIVE Sensitive     AMPICILLIN/SULBACTAM 4 SENSITIVE Sensitive     PIP/TAZO <=4 SENSITIVE Sensitive     * 50,000 COLONIES/mL KLEBSIELLA OZAENAE  Blood culture (routine x 2)     Status: None   Collection Time: 11/01/21 11:27 AM   Specimen: BLOOD  Result Value Ref Range Status   Specimen Description BLOOD BLOOD LEFT WRIST  Final   Special Requests   Final    BOTTLES DRAWN AEROBIC AND ANAEROBIC Blood Culture  results may not be optimal due to an excessive volume of blood received in culture bottles   Culture   Final    NO GROWTH 5 DAYS Performed at [facility], [address], [place], [place] [address]    Report Status 11/06/2021 FINAL  Final  Blood culture (routine x 2)     Status: None   Collection Time: 11/01/21 11:27 AM   Specimen: BLOOD  Result Value Ref Range Status   Specimen Description BLOOD BLOOD LEFT ARM  Final   Special Requests   Final    BOTTLES DRAWN AEROBIC AND ANAEROBIC Blood Culture results may not be optimal due to an excessive volume of blood received in culture bottles   Culture   Final    NO GROWTH 5 DAYS Performed at [facility], [address], [place], [place] [address]    Report Status 11/06/2021 FINAL  Final  MRSA Next Gen by PCR, Nasal     Status: None   Collection Time: 11/01/21  5:31 PM   Specimen: Nasal Mucosa; Nasal Swab  Result Value Ref Range Status   MRSA by PCR Next Gen NOT DETECTED NOT DETECTED Final    Comment: (NOTE) The GeneXpert MRSA Assay (FDA approved for NASAL specimens only), is one component of a comprehensive MRSA colonization surveillance program. It is not intended to diagnose MRSA infection nor to guide or monitor treatment for MRSA infections. Test performance is not FDA approved in patients less than 72 years old. Performed at [facility], [address] [place], [place] [address]     Labs: CBC: Recent Labs  Lab 02/26/23 2038 02/27/23 0438 02/28/23 0448  WBC 7.3 7.8  --   HGB 9.8* 8.8* 9.2*  HCT 30.3* 27.4*  --   MCV 96.8 97.5  --   PLT 235 218  --    Basic Metabolic Panel: Recent Labs  Lab 02/26/23 2038 02/27/23 0546  NA 133* 137  K 3.9 4.3  CL 97* 105  CO2 25 25  GLUCOSE 157* 90  BUN 29* 25*  CREATININE 1.00 0.87  CALCIUM 8.7* 7.9*    Discharge time spent: greater than 30 minutes.  Signed: Enedina Finner, MD [facility] 02/28/2023

## 2023-02-28 NOTE — TOC Transition Note (Signed)
Transition of Care [facility]) - CM/SW Discharge Note   Patient Details  Name: Cindy Graham MRN: [id number] Date of Birth: [birth year]  Transition [facility] CM/SW Contact:  Garret Reddish, RN Phone Number: 02/28/2023, 4:13 PM   Clinical Narrative:    Chart reviewed.  Noted that patient has orders for discharge today.  Patient will return to [facility] facility today.  I have spoken with Luster Landsberg, Facilities manager at [facility].  She informs me that patient is able to return to the facility today. Renee reports that they are not able to transport patient back to the facility today.  Family is also not able to transport patient to the facility today. Renee informs me that patient is active with [facility] for Home Health PT. I have informed Renee that patient will need to continue PT services with [facility] on discharge.  [facility] will also follow for Palliative Care.    I have faxed Renee patient's Discharge Summary, Fl 2, and home health orders.    I have informed patient's daughter Cindy Graham that patient will be a discharge for today.  I have asked staff nurse to call report to [phone].    [facility] will transport patient to the facility today.    I have informed staff nurse of the above information.     Final next level of care: Assisted Living [facility]) Barriers to Discharge: No Barriers Identified   Patient Goals and CMS Choice CMS Medicare.gov Compare Post Acute Care list provided to::  (Patient a resident of [facility] and wishes to return)    Discharge Placement                Patient chooses bed at:  [facility]) Patient to be transferred to facility by: [facility] Name of family member notified: Cindy Graham ( patient's daughter) Patient and family notified of of transfer: 02/28/23  Discharge Plan and Services Additional resources added to the After Visit Summary for                               [facility] Agency: [facility] (Patient was active with [facility] at facility) Date [facility] Agency Contacted: 02/28/23 Time HH Agency Contacted: 1100 Representative spoke with at [facility] Agency: [place]  Social Determinants of Health (SDOH) Interventions SDOH Screenings   Food Insecurity: No Food Insecurity (02/27/2023)  Housing: Low Risk  (02/27/2023)  Transportation Needs: No Transportation Needs (02/27/2023)  Utilities: Not At Risk (02/27/2023)  Tobacco Use: Low Risk  (02/27/2023)     Readmission Risk Interventions     No data to display

## 2023-02-28 NOTE — Care Management Obs Status (Signed)
MEDICARE OBSERVATION STATUS NOTIFICATION   Patient Details  Name: KIMMY TOTTEN MRN: [id number] Date of Birth: 04-21-1922   Medicare Observation Status Notification Given:  Yes    Garret Reddish, RN 02/28/2023, 11:49 AM

## 2023-07-29 ENCOUNTER — Other Ambulatory Visit: Payer: Self-pay

## 2023-07-29 ENCOUNTER — Emergency Department
Admission: EM | Admit: 2023-07-29 | Discharge: 2023-07-30 | Disposition: A | Discharge status: Home / Self Care | Attending: Emergency Medicine | Admitting: Emergency Medicine

## 2023-07-29 DIAGNOSIS — Z79899 Other long term (current) drug therapy: Secondary | ICD-10-CM | POA: Diagnosis not present

## 2023-07-29 DIAGNOSIS — Z85828 Personal history of other malignant neoplasm of skin: Secondary | ICD-10-CM | POA: Diagnosis not present

## 2023-07-29 DIAGNOSIS — J45909 Unspecified asthma, uncomplicated: Secondary | ICD-10-CM | POA: Insufficient documentation

## 2023-07-29 DIAGNOSIS — S81811A Laceration without foreign body, right lower leg, initial encounter: Secondary | ICD-10-CM | POA: Diagnosis not present

## 2023-07-29 DIAGNOSIS — S8991XA Unspecified injury of right lower leg, initial encounter: Secondary | ICD-10-CM | POA: Diagnosis present

## 2023-07-29 DIAGNOSIS — I10 Essential (primary) hypertension: Secondary | ICD-10-CM | POA: Insufficient documentation

## 2023-07-29 DIAGNOSIS — J449 Chronic obstructive pulmonary disease, unspecified: Secondary | ICD-10-CM | POA: Diagnosis not present

## 2023-07-29 DIAGNOSIS — W228XXA Striking against or struck by other objects, initial encounter: Secondary | ICD-10-CM | POA: Insufficient documentation

## 2023-07-29 MED ORDER — BACITRACIN ZINC 500 UNIT/GM EX OINT
TOPICAL_OINTMENT | CUTANEOUS | Status: AC
  Filled 2023-07-29: qty 0.9

## 2023-07-29 NOTE — ED Triage Notes (Addendum)
 Pt here from [facility] after hitting her leg on wheelchair, causing skin tear. Right shin dressed by EMS- skin tear noted on anterior mid-shin, bleeding controlled without dressing. Wound redressed w/ gauze wrap at this time. Pt is AOX4, NAD noted. Pt not on blood thinners.

## 2023-07-29 NOTE — ED Provider Notes (Signed)
 [facility] Provider Note    Event Date/Time   First MD Initiated Contact with Patient 07/29/23 2314     (approximate)   History   Laceration   HPI  Cindy Graham is a 89 y.o. female with history of COPD, hypertension, hyperlipidemia who presents to the emergency department with a skin tear to the right shin.  Patient states that she hit it on her wheelchair.  She did not fall or hit her head.  States her tetanus vaccine is up-to-date.  Patient reports she lives in assisted living facility.   History provided by patient.    Past Medical History:  Diagnosis Date   Anxiety    Arthritis    osteo   Asthma    COPD (chronic obstructive pulmonary disease) (HCC)    GERD (gastroesophageal reflux disease)    Heart murmur    Hernia    Hyperlipidemia    Hypertension    Melanoma (HCC)    face   Syncope and collapse    Urine incontinence     Past Surgical History:  Procedure Laterality Date   ABDOMINAL HYSTERECTOMY     AMPUTATION TOE Right 06/14/2018   Procedure: AMPUTATION TOE - RIGHT 5TH;  Surgeon: Recardo Evangelist, DPM;  Location: [facility] ORS;  Service: Podiatry;  Laterality: Right;   CHOLECYSTECTOMY     HERNIA REPAIR     INTRAMEDULLARY (IM) NAIL INTERTROCHANTERIC Right 06/22/2019   Procedure: INTRAMEDULLARY (IM) NAIL INTERTROCHANTRIC;  Surgeon: Tarry Kos, MD;  Location: [facility] OR;  Service: Orthopedics;  Laterality: Right;    MEDICATIONS:  Prior to Admission medications   Medication Sig Start Date End Date Taking? Authorizing Provider  alendronate (FOSAMAX) 70 MG tablet Take 70 mg by mouth every 7 (seven) days. monday    [provider]  enalapril (VASOTEC) 20 MG tablet Take 20 mg by mouth daily. 01/31/20   [provider]  feeding supplement, ENSURE ENLIVE, (ENSURE ENLIVE) LIQD Take 237 mLs by mouth 2 (two) times daily between meals. 06/25/19   Regalado, Prentiss Bells, MD  FLONASE ALLERGY RELIEF 50 MCG/ACT nasal spray Place 2 sprays into  both nostrils daily. 08/16/21   [provider]  guaifenesin (ROBITUSSIN) 100 MG/5ML syrup Take 200 mg by mouth every 6 (six) hours as needed for cough.    [provider]  lactulose (CHRONULAC) 10 GM/15ML solution Take 15 mLs by mouth daily as needed. 10/23/21   [provider]  Menthol, Topical Analgesic, 4 % GEL Apply 1 Application topically 3 (three) times daily as needed. Apply to affected area of legs and neck    [provider]  mirabegron ER (MYRBETRIQ) 25 MG TB24 tablet Take 1 tablet (25 mg total) by mouth daily. 01/12/21   Vaillancourt, Lelon Mast, PA-C  nitroGLYCERIN (NITROSTAT) 0.4 MG SL tablet Place 0.4 mg under the tongue every 5 (five) minutes as needed for chest pain.    [provider]  nystatin cream (MYCOSTATIN) Apply 1 application  topically as needed (panniculitis). Apply to pannus    [provider]  omeprazole (PRILOSEC) 20 MG capsule TAKE 1 CAPSULE BY MOUTH EVERY DAY. 11/15/17   Glori Luis, MD  polyethylene glycol (MIRALAX / GLYCOLAX) 17 g packet Take 17 g by mouth daily. 06/26/19   Regalado, Belkys A, MD  propranolol (INDERAL) 40 MG tablet Take 0.5 tablets (20 mg total) by mouth 2 (two) times daily. 02/28/23   Enedina Finner, MD  Psyllium 400 MG CAPS Take 0.4 g by  mouth in the morning and at bedtime.    [provider]  senna (SENOKOT) 8.6 MG TABS tablet Take 1 tablet by mouth at bedtime.    [provider]  sertraline (ZOLOFT) 100 MG tablet TAKE ONE TABLET BY MOUTH EVERY DAY 11/15/17   Glori Luis, MD  SM ASPIRIN LOW DOSE 81 MG chewable tablet Chew 1 tablet (81 mg total) by mouth daily. 03/07/23   Enedina Finner, MD  sodium chloride (OCEAN) 0.65 % nasal spray Place 1 spray into the nose every 2 (two) hours as needed for congestion. 09/06/21   [provider]  tapentadol (NUCYNTA) 50 MG tablet Take 50 mg by mouth 3 (three) times daily.    [provider]  VENTOLIN HFA 108 (90 Base) MCG/ACT  inhaler Inhale 2 puffs into the lungs every 4 (four) hours as needed. 12/04/20   [provider]  verapamil (VERELAN) 180 MG 24 hr capsule Take 1 capsule (180 mg total) by mouth at bedtime. 02/28/23   Enedina Finner, MD    Physical Exam   Triage Vital Signs: ED Triage Vitals  Encounter Vitals Group     BP 07/29/23 2247 (!) 166/64     Systolic BP Percentile --      Diastolic BP Percentile --      Pulse Rate 07/29/23 2247 65     Resp 07/29/23 2247 18     Temp 07/29/23 2247 98.3 F (36.8 C)     Temp Source 07/29/23 2247 Oral     SpO2 07/29/23 2247 95 %     Weight --      Height --      Head Circumference --      Peak Flow --      Pain Score 07/29/23 2248 3     Pain Loc --      Pain Education --      Exclude from Growth Chart --     Most recent vital signs: Vitals:   07/29/23 2247  BP: (!) 166/64  Pulse: 65  Resp: 18  Temp: 98.3 F (36.8 C)  SpO2: 95%    CONSTITUTIONAL: Alert, responds appropriately to questions. Well-appearing; well-nourished HEAD: Normocephalic, atraumatic EYES: Conjunctivae clear, pupils appear equal, sclera nonicteric ENT: normal nose; moist mucous membranes NECK: Supple, normal ROM CARD: RRR; S1 and S2 appreciated RESP: Normal chest excursion without splinting or tachypnea; breath sounds clear and equal bilaterally; no wheezes, no rhonchi, no rales, no hypoxia or respiratory distress, speaking full sentences ABD/GI: Non-distended; soft, non-tender, no rebound, no guarding, no peritoneal signs BACK: The back appears normal EXT: Normal ROM in all joints; 4 cm superficial skin tear to the distal right anterior shin, no bony deformity or bony tenderness, no calf swelling or calf tenderness, compartments are soft, palpable DP pulses bilaterally SKIN: Normal color for age and race; warm; no rash on exposed skin NEURO: Moves all extremities equally, normal speech PSYCH: The patient's mood and manner are appropriate.   ED Results / Procedures /  Treatments   LABS: (all labs ordered are listed, but only abnormal results are displayed) Labs Reviewed - No data to display   EKG:   RADIOLOGY: My personal review and interpretation of imaging:    I have personally reviewed all radiology reports.   No results found.   PROCEDURES:  Critical Care performed: No      Procedures    IMPRESSION / MDM / ASSESSMENT AND PLAN / ED COURSE  I reviewed the triage vital  signs and the nursing notes.    Patient here with superficial skin tear to the right leg.    DIFFERENTIAL DIAGNOSIS (includes but not limited to):   Skin tear, no sign of superimposed infection, no sign of bony injury, no sign of compartment syndrome   Patient's presentation is most consistent with acute, uncomplicated illness.   PLAN: Patient has an acute uncomplicated skin tear to the right leg.  Not amendable to suturing.  No sign of infection.  No sign of bony injury.  Neurovascular intact distally.  Will clean wound and apply sterile dressing.  Discussed wound care instructions.  She has a PCP.   MEDICATIONS GIVEN IN ED: Medications  bacitracin 500 UNIT/GM ointment (has no administration in time range)     ED COURSE:  At this time, I do not feel there is any life-threatening condition present. I reviewed all nursing notes, vitals, pertinent previous records.  All lab and urine results, EKGs, imaging ordered have been independently reviewed and interpreted by myself.  I reviewed all available radiology reports from any imaging ordered this visit.  Based on my assessment, I feel the patient is safe to be discharged home without further emergent workup and can continue workup as an outpatient as needed. Discussed all findings, treatment plan as well as usual and customary return precautions.  They verbalize understanding and are comfortable with this plan.  Outpatient follow-up has been provided as needed.  All questions have been answered.    CONSULTS:   none   OUTSIDE RECORDS REVIEWED: Reviewed last admission in October 2024.       FINAL CLINICAL IMPRESSION(S) / ED DIAGNOSES   Final diagnoses:  Noninfected skin tear of right lower extremity, initial encounter     Rx / DC Orders   ED Discharge Orders     None        Note:  This document was prepared using Dragon voice recognition software and may include unintentional dictation errors.   Elania Crowl, Layla Maw, DO 07/30/23 [phone]

## 2023-09-07 ENCOUNTER — Emergency Department

## 2023-09-07 ENCOUNTER — Inpatient Hospital Stay
Admission: EM | Admit: 2023-09-07 | Discharge: 2023-09-12 | DRG: 381 | Disposition: A | Discharge status: Nursing Home (Includes ICF) | Source: Skilled Nursing Facility | Attending: Osteopathic Medicine | Admitting: Osteopathic Medicine

## 2023-09-07 ENCOUNTER — Other Ambulatory Visit: Payer: Self-pay

## 2023-09-07 ENCOUNTER — Encounter
Admission: EM | Disposition: A | Discharge status: Nursing Home (Includes ICF) | Payer: Self-pay | Source: Skilled Nursing Facility | Attending: Osteopathic Medicine

## 2023-09-07 DIAGNOSIS — E785 Hyperlipidemia, unspecified: Secondary | ICD-10-CM | POA: Diagnosis present

## 2023-09-07 DIAGNOSIS — I2489 Other forms of acute ischemic heart disease: Secondary | ICD-10-CM | POA: Diagnosis present

## 2023-09-07 DIAGNOSIS — I5032 Chronic diastolic (congestive) heart failure: Secondary | ICD-10-CM | POA: Diagnosis present

## 2023-09-07 DIAGNOSIS — Z8249 Family history of ischemic heart disease and other diseases of the circulatory system: Secondary | ICD-10-CM

## 2023-09-07 DIAGNOSIS — Z9049 Acquired absence of other specified parts of digestive tract: Secondary | ICD-10-CM

## 2023-09-07 DIAGNOSIS — R011 Cardiac murmur, unspecified: Secondary | ICD-10-CM | POA: Diagnosis present

## 2023-09-07 DIAGNOSIS — K922 Gastrointestinal hemorrhage, unspecified: Secondary | ICD-10-CM | POA: Diagnosis present

## 2023-09-07 DIAGNOSIS — K219 Gastro-esophageal reflux disease without esophagitis: Secondary | ICD-10-CM | POA: Diagnosis present

## 2023-09-07 DIAGNOSIS — Z880 Allergy status to penicillin: Secondary | ICD-10-CM

## 2023-09-07 DIAGNOSIS — Z89421 Acquired absence of other right toe(s): Secondary | ICD-10-CM

## 2023-09-07 DIAGNOSIS — Z885 Allergy status to narcotic agent status: Secondary | ICD-10-CM

## 2023-09-07 DIAGNOSIS — K2211 Ulcer of esophagus with bleeding: Secondary | ICD-10-CM | POA: Diagnosis not present

## 2023-09-07 DIAGNOSIS — Z79899 Other long term (current) drug therapy: Secondary | ICD-10-CM

## 2023-09-07 DIAGNOSIS — K920 Hematemesis: Principal | ICD-10-CM

## 2023-09-07 DIAGNOSIS — Z881 Allergy status to other antibiotic agents status: Secondary | ICD-10-CM

## 2023-09-07 DIAGNOSIS — Z90710 Acquired absence of both cervix and uterus: Secondary | ICD-10-CM

## 2023-09-07 DIAGNOSIS — Z6841 Body Mass Index (BMI) 40.0 and over, adult: Secondary | ICD-10-CM

## 2023-09-07 DIAGNOSIS — M810 Age-related osteoporosis without current pathological fracture: Secondary | ICD-10-CM | POA: Diagnosis present

## 2023-09-07 DIAGNOSIS — Z66 Do not resuscitate: Secondary | ICD-10-CM | POA: Diagnosis present

## 2023-09-07 DIAGNOSIS — K449 Diaphragmatic hernia without obstruction or gangrene: Secondary | ICD-10-CM | POA: Diagnosis present

## 2023-09-07 DIAGNOSIS — Z7982 Long term (current) use of aspirin: Secondary | ICD-10-CM

## 2023-09-07 DIAGNOSIS — Z882 Allergy status to sulfonamides status: Secondary | ICD-10-CM

## 2023-09-07 DIAGNOSIS — Z85820 Personal history of malignant melanoma of skin: Secondary | ICD-10-CM

## 2023-09-07 DIAGNOSIS — F419 Anxiety disorder, unspecified: Secondary | ICD-10-CM | POA: Diagnosis present

## 2023-09-07 DIAGNOSIS — I160 Hypertensive urgency: Secondary | ICD-10-CM | POA: Diagnosis present

## 2023-09-07 DIAGNOSIS — E871 Hypo-osmolality and hyponatremia: Secondary | ICD-10-CM | POA: Diagnosis present

## 2023-09-07 DIAGNOSIS — Z7983 Long term (current) use of bisphosphonates: Secondary | ICD-10-CM

## 2023-09-07 DIAGNOSIS — D62 Acute posthemorrhagic anemia: Secondary | ICD-10-CM | POA: Diagnosis present

## 2023-09-07 DIAGNOSIS — R944 Abnormal results of kidney function studies: Secondary | ICD-10-CM | POA: Diagnosis present

## 2023-09-07 DIAGNOSIS — M1990 Unspecified osteoarthritis, unspecified site: Secondary | ICD-10-CM | POA: Diagnosis present

## 2023-09-07 DIAGNOSIS — H9190 Unspecified hearing loss, unspecified ear: Secondary | ICD-10-CM | POA: Diagnosis present

## 2023-09-07 DIAGNOSIS — Z888 Allergy status to other drugs, medicaments and biological substances status: Secondary | ICD-10-CM

## 2023-09-07 DIAGNOSIS — R6510 Systemic inflammatory response syndrome (SIRS) of non-infectious origin without acute organ dysfunction: Secondary | ICD-10-CM | POA: Diagnosis present

## 2023-09-07 DIAGNOSIS — D72829 Elevated white blood cell count, unspecified: Secondary | ICD-10-CM | POA: Diagnosis present

## 2023-09-07 DIAGNOSIS — I110 Hypertensive heart disease with heart failure: Secondary | ICD-10-CM | POA: Diagnosis present

## 2023-09-07 DIAGNOSIS — J4489 Other specified chronic obstructive pulmonary disease: Secondary | ICD-10-CM | POA: Diagnosis present

## 2023-09-07 HISTORY — PX: ESOPHAGOGASTRODUODENOSCOPY: SHX5428

## 2023-09-07 HISTORY — PX: SUBMUCOSAL INJECTION: SHX5543

## 2023-09-07 LAB — BASIC METABOLIC PANEL WITH GFR
Anion gap: 9 (ref 5–15)
BUN: 41 mg/dL — ABNORMAL HIGH (ref 8–23)
CO2: 24 mmol/L (ref 22–32)
Calcium: 8.9 mg/dL (ref 8.9–10.3)
Chloride: 98 mmol/L (ref 98–111)
Creatinine, Ser: 0.73 mg/dL (ref 0.44–1.00)
GFR, Estimated: >60 mL/min (ref >60)
Glucose, Bld: 163 mg/dL — ABNORMAL HIGH (ref 70–99)
Potassium: 4.4 mmol/L (ref 3.5–5.1)
Sodium: 131 mmol/L — ABNORMAL LOW (ref 135–145)

## 2023-09-07 LAB — CBC WITH DIFFERENTIAL/PLATELET
Abs Immature Granulocytes: 0.06 10*3/uL (ref 0.00–0.07)
Abs Immature Granulocytes: 0.06 10*3/uL (ref 0.00–0.07)
Basophils Absolute: 0.1 10*3/uL (ref 0.0–0.1)
Basophils Absolute: 0 10*3/uL (ref 0.0–0.1)
Basophils Relative: 0 %
Basophils Relative: 0 %
Eosinophils Absolute: 0.1 10*3/uL (ref 0.0–0.5)
Eosinophils Absolute: 0 10*3/uL (ref 0.0–0.5)
Eosinophils Relative: 0 %
Eosinophils Relative: 0 %
HCT: 34.1 % — ABNORMAL LOW (ref 36.0–46.0)
HCT: 32.4 % — ABNORMAL LOW (ref 36.0–46.0)
Hemoglobin: 11.1 g/dL — ABNORMAL LOW (ref 12.0–15.0)
Hemoglobin: 10.4 g/dL — ABNORMAL LOW (ref 12.0–15.0)
Immature Granulocytes: 0 %
Immature Granulocytes: 0 %
Lymphocytes Relative: 10 %
Lymphocytes Relative: 10 %
Lymphs Abs: 1.4 10*3/uL (ref 0.7–4.0)
Lymphs Abs: 1.5 10*3/uL (ref 0.7–4.0)
MCH: 30.4 pg (ref 26.0–34.0)
MCH: 30.6 pg (ref 26.0–34.0)
MCHC: 32.6 g/dL (ref 30.0–36.0)
MCHC: 32.1 g/dL (ref 30.0–36.0)
MCV: 93.4 fL (ref 80.0–100.0)
MCV: 95.3 fL (ref 80.0–100.0)
Monocytes Absolute: 0.8 10*3/uL (ref 0.1–1.0)
Monocytes Absolute: 0.7 10*3/uL (ref 0.1–1.0)
Monocytes Relative: 5 %
Monocytes Relative: 5 %
Neutro Abs: 12.4 10*3/uL — ABNORMAL HIGH (ref 1.7–7.7)
Neutro Abs: 12.3 10*3/uL — ABNORMAL HIGH (ref 1.7–7.7)
Neutrophils Relative %: 85 %
Neutrophils Relative %: 85 %
Platelets: 236 10*3/uL (ref 150–400)
Platelets: 230 10*3/uL (ref 150–400)
RBC: 3.65 MIL/uL — ABNORMAL LOW (ref 3.87–5.11)
RBC: 3.4 MIL/uL — ABNORMAL LOW (ref 3.87–5.11)
RDW: 15.4 % (ref 11.5–15.5)
RDW: 15.3 % (ref 11.5–15.5)
WBC: 14.8 10*3/uL — ABNORMAL HIGH (ref 4.0–10.5)
WBC: 14.7 10*3/uL — ABNORMAL HIGH (ref 4.0–10.5)
nRBC: 0 % (ref 0.0–0.2)
nRBC: 0 % (ref 0.0–0.2)

## 2023-09-07 LAB — HEPATIC FUNCTION PANEL
ALT: 15 U/L (ref 0–44)
AST: 27 U/L (ref 15–41)
Albumin: 3.2 g/dL — ABNORMAL LOW (ref 3.5–5.0)
Alkaline Phosphatase: 59 U/L (ref 38–126)
Bilirubin, Direct: 0.2 mg/dL (ref 0.0–0.2)
Indirect Bilirubin: 0.7 mg/dL (ref 0.3–0.9)
Total Bilirubin: 0.9 mg/dL (ref 0.0–1.2)
Total Protein: 7 g/dL (ref 6.5–8.1)

## 2023-09-07 LAB — PROTIME-INR
INR: 1.1 (ref 0.8–1.2)
Prothrombin Time: 14.5 s (ref 11.4–15.2)

## 2023-09-07 SURGERY — EGD (ESOPHAGOGASTRODUODENOSCOPY)
Anesthesia: General

## 2023-09-07 MED ORDER — IOHEXOL 300 MG/ML  SOLN
75.0000 mL | Freq: Once | INTRAMUSCULAR | Status: AC | PRN
  Administered 2023-09-07: 75 mL via INTRAVENOUS

## 2023-09-07 MED ORDER — ONDANSETRON HCL 4 MG/2ML IJ SOLN
4.0000 mg | Freq: Once | INTRAMUSCULAR | Status: AC
  Administered 2023-09-07: 4 mg via INTRAVENOUS
  Filled 2023-09-07: qty 2

## 2023-09-07 MED ORDER — PANTOPRAZOLE SODIUM 40 MG IV SOLR
40.0000 mg | Freq: Once | INTRAVENOUS | Status: AC
  Administered 2023-09-07: 40 mg via INTRAVENOUS
  Filled 2023-09-07: qty 10

## 2023-09-07 NOTE — ED Provider Notes (Incomplete)
 [facility] Provider Note    Event Date/Time   First MD Initiated Contact with Patient 09/07/23 1928     (approximate)   History   No chief complaint on file.   HPI Cindy Graham is a 89 y.o. female presenting today for vomiting blood.  Patient states she was eating this morning and combination of pancake and eggs.  Felt like something got stuck at that time.  Since then she has tolerated liquids but has not tried any solids.  More recently over the past hour she has started spitting up and vomiting blood.  EMS reported a possible cut in the back of her mouth.  Not on any blood thinners.  She denies any nausea at this time.     Physical Exam   Triage Vital Signs: ED Triage Vitals  Encounter Vitals Group     BP      Systolic BP Percentile      Diastolic BP Percentile      Pulse      Resp      Temp      Temp src      SpO2      Weight      Height      Head Circumference      Peak Flow      Pain Score      Pain Loc      Pain Education      Exclude from Growth Chart     Most recent vital signs: Vitals:   09/07/23 2142 09/07/23 2312  BP: (!) 183/82 (!) 145/69  Pulse: 88 90  Resp: 20 19  Temp:    SpO2: 98% 97%    I have reviewed the vital signs. General:  Awake, alert, no acute distress. Head:  Normocephalic, Atraumatic. EENT:  PERRL, EOMI, Oral mucosa pink and moist, Neck is supple.  Does appear to have abrasion to left posterior oropharynx.  However when assessing with tongue depressor she vomited a large amount of blood Cardiovascular: Regular rate, 2+ distal pulses. Respiratory:  Normal respiratory effort, symmetrical expansion, no distress.   Extremities:  Moving all four extremities through full ROM without pain.   Neuro:  Alert and oriented.  Interacting appropriately.   Skin:  Warm, dry, no rash.   Psych: Appropriate affect.    ED Results / Procedures / Treatments   Labs (all labs ordered are listed, but only abnormal  results are displayed) Labs Reviewed  CBC WITH DIFFERENTIAL/PLATELET - Abnormal; Notable for the following components:      Result Value   WBC 14.8 (*)    RBC 3.65 (*)    Hemoglobin 11.1 (*)    HCT 34.1 (*)    Neutro Abs 12.4 (*)    All other components within normal limits  BASIC METABOLIC PANEL WITH GFR - Abnormal; Notable for the following components:   Sodium 131 (*)    Glucose, Bld 163 (*)    BUN 41 (*)    All other components within normal limits  CBC WITH DIFFERENTIAL/PLATELET - Abnormal; Notable for the following components:   WBC 14.7 (*)    RBC 3.40 (*)    Hemoglobin 10.4 (*)    HCT 32.4 (*)    Neutro Abs 12.3 (*)    All other components within normal limits  HEPATIC FUNCTION PANEL - Abnormal; Notable for the following components:   Albumin 3.2 (*)    All other components within normal limits  PROTIME-INR  TYPE AND SCREEN  TYPE AND SCREEN     EKG    RADIOLOGY Independently interpreted CT chest showing distended fluid-filled esophagus and cannot fully rule out active extravasation.   PROCEDURES:  Critical Care performed: Yes, see critical care procedure note(s)  .Critical Care  Performed by: Janith Lima, MD Authorized by: Janith Lima, MD   Critical care provider statement:    Critical care time (minutes):  30   Critical care was necessary to treat or prevent imminent or life-threatening deterioration of the following conditions: esophageal bleeding with large volume hematemesis.   Critical care was time spent personally by me on the following activities:  Development of treatment plan with patient or surrogate, discussions with consultants, evaluation of patient's response to treatment, examination of patient, ordering and review of laboratory studies, ordering and review of radiographic studies, ordering and performing treatments and interventions, pulse oximetry, re-evaluation of patient's condition and review of old charts   I assumed direction of  critical care for this patient from another provider in my specialty: no     Care discussed with: admitting provider      MEDICATIONS ORDERED IN ED: Medications  ondansetron (ZOFRAN) injection 4 mg (4 mg Intravenous Given 09/07/23 2000)  pantoprazole (PROTONIX) injection 40 mg (40 mg Intravenous Given 09/07/23 2020)  iohexol (OMNIPAQUE) 300 MG/ML solution 75 mL (75 mLs Intravenous Contrast Given 09/07/23 2041)     IMPRESSION / MDM / ASSESSMENT AND PLAN / ED COURSE  I reviewed the triage vital signs and the nursing notes.                              Differential diagnosis includes, but is not limited to, esophageal tear, retained foreign body with irritation of the esophagus, posterior oropharynx laceration  Patient's presentation is most consistent with acute presentation with potential threat to life or bodily function.  Patient is 89 year old female presenting today for vomiting blood.  Reportedly ate some food this morning with concern of possible esophageal obstruction.  Has tolerated liquids since then.  More recently started vomiting up blood and spitting up blood.  On arrival, there was some irritation to the left posterior oropharynx with mild bleeding.  However when attempting to look further with a tongue depressor she vomited a large amount of straight blood from the esophagus.  Currently stable on exam but will get imaging to evaluate for esophageal tear or active bleeding at this time.  CT of chest shows distended and fluid-filled esophagus likely consistent with blood and cannot fully rule out active extravasation.  Messaged GI with Dr. Allegra Lai.  She recommended rechecking hemoglobin now.  If stable then potentially could have EGD in the morning but if continuing to downtrend would likely need procedure tonight.  Hemoglobin went from 11.1 to 10.4.  Patient now having more frequent vomiting approximately every 15 to 20 minutes still with hematemesis.  Reach back out to GI team was  very concerned for potential decompensation as her blood pressure initially started in the 200s systolics and now downtrending down to 145.  GI has agreed to come in for EGD.  Discussing with ICU team for admission where procedure will occur.***  ***The patient is on the cardiac monitor to evaluate for evidence of arrhythmia and/or significant heart rate changes. Clinical Course as of 09/07/23 2341  Thu Sep 07, 2023  2157 GI (Dr. Allegra Lai) - recheck her hgb now.  If stable, plan for  EGD in the morning.  If downtrending, likely will need procedure tonight for which she will need to be intubated and in an ICU bed. [DW]  2321 Spoke with GI again she is now having vomiting with blood approximately every 15 to 20 minutes.  Hemoglobin went from 11.1-10.4.  Concern for worsening of her symptoms and given her age and comorbidities, could have rapid deterioration even though vitals are okay at this time. [DW]    Clinical Course User Index [DW] Kandee Orion, MD     FINAL CLINICAL IMPRESSION(S) / ED DIAGNOSES   Final diagnoses:  Hematemesis with nausea     Rx / DC Orders   ED Discharge Orders     None        Note:  This document was prepared using Dragon voice recognition software and may include unintentional dictation errors.

## 2023-09-07 NOTE — ED Triage Notes (Signed)
 Pt to ED via EMS from [facility], pt reports this morning while eatin breakfast she felt like she scratched her throat on a pancake. Pt spitting out blood in triage, pt noted to have clot to back left of throat. Pt denies blood thinner use or pain at this time. Pt a&o x4. Pt denies shortness of breath

## 2023-09-07 NOTE — ED Notes (Signed)
 Pt had another episode of spitting up blood x1. Changed out of dress, placed in hospital gown. Placed on cardiac monitor. Provided warm blankets

## 2023-09-07 NOTE — ED Provider Notes (Signed)
 [facility] Provider Note    Event Date/Time   First MD Initiated Contact with Patient 09/07/23 1928     (approximate)   History   No chief complaint on file.   HPI Cindy Graham is a 89 y.o. female presenting today for vomiting blood.  Patient states she was eating this morning and combination of pancake and eggs.  Felt like something got stuck at that time.  Since then she has tolerated liquids but has not tried any solids.  More recently over the past hour she has started spitting up and vomiting blood.  EMS reported a possible cut in the back of her mouth.  Not on any blood thinners.  She denies any nausea at this time.     Physical Exam   Triage Vital Signs: ED Triage Vitals  Encounter Vitals Group     BP      Systolic BP Percentile      Diastolic BP Percentile      Pulse      Resp      Temp      Temp src      SpO2      Weight      Height      Head Circumference      Peak Flow      Pain Score      Pain Loc      Pain Education      Exclude from Growth Chart     Most recent vital signs: Vitals:   09/07/23 2142 09/07/23 2312  BP: (!) 183/82 (!) 145/69  Pulse: 88 90  Resp: 20 19  Temp:    SpO2: 98% 97%    I have reviewed the vital signs. General:  Awake, alert, no acute distress. Head:  Normocephalic, Atraumatic. EENT:  PERRL, EOMI, Oral mucosa pink and moist, Neck is supple.  Does appear to have abrasion to left posterior oropharynx.  However when assessing with tongue depressor she vomited a large amount of blood Cardiovascular: Regular rate, 2+ distal pulses. Respiratory:  Normal respiratory effort, symmetrical expansion, no distress.   Extremities:  Moving all four extremities through full ROM without pain.   Neuro:  Alert and oriented.  Interacting appropriately.   Skin:  Warm, dry, no rash.   Psych: Appropriate affect.    ED Results / Procedures / Treatments   Labs (all labs ordered are listed, but only abnormal  results are displayed) Labs Reviewed  CBC WITH DIFFERENTIAL/PLATELET - Abnormal; Notable for the following components:      Result Value   WBC 14.8 (*)    RBC 3.65 (*)    Hemoglobin 11.1 (*)    HCT 34.1 (*)    Neutro Abs 12.4 (*)    All other components within normal limits  BASIC METABOLIC PANEL WITH GFR - Abnormal; Notable for the following components:   Sodium 131 (*)    Glucose, Bld 163 (*)    BUN 41 (*)    All other components within normal limits  CBC WITH DIFFERENTIAL/PLATELET - Abnormal; Notable for the following components:   WBC 14.7 (*)    RBC 3.40 (*)    Hemoglobin 10.4 (*)    HCT 32.4 (*)    Neutro Abs 12.3 (*)    All other components within normal limits  HEPATIC FUNCTION PANEL - Abnormal; Notable for the following components:   Albumin  3.2 (*)    All other components within normal limits  PROTIME-INR  TYPE AND SCREEN  TYPE AND SCREEN     EKG    RADIOLOGY Independently interpreted CT chest showing distended fluid-filled esophagus and cannot fully rule out active extravasation.   PROCEDURES:  Critical Care performed: Yes, see critical care procedure note(s)  .Critical Care  Performed by: Kandee Orion, MD Authorized by: Kandee Orion, MD   Critical care provider statement:    Critical care time (minutes):  30   Critical care was necessary to treat or prevent imminent or life-threatening deterioration of the following conditions: esophageal bleeding with large volume hematemesis.   Critical care was time spent personally by me on the following activities:  Development of treatment plan with patient or surrogate, discussions with consultants, evaluation of patient's response to treatment, examination of patient, ordering and review of laboratory studies, ordering and review of radiographic studies, ordering and performing treatments and interventions, pulse oximetry, re-evaluation of patient's condition and review of old charts   I assumed direction of  critical care for this patient from another provider in my specialty: no     Care discussed with: admitting provider      MEDICATIONS ORDERED IN ED: Medications  ondansetron  (ZOFRAN ) injection 4 mg (4 mg Intravenous Given 09/07/23 2000)  pantoprazole  (PROTONIX ) injection 40 mg (40 mg Intravenous Given 09/07/23 2020)  iohexol  (OMNIPAQUE ) 300 MG/ML solution 75 mL (75 mLs Intravenous Contrast Given 09/07/23 2041)     IMPRESSION / MDM / ASSESSMENT AND PLAN / ED COURSE  I reviewed the triage vital signs and the nursing notes.                              Differential diagnosis includes, but is not limited to, esophageal tear, retained foreign body with irritation of the esophagus, posterior oropharynx laceration  Patient's presentation is most consistent with acute presentation with potential threat to life or bodily function.  Patient is 89 year old female presenting today for vomiting blood.  Reportedly ate some food this morning with concern of possible esophageal obstruction.  Has tolerated liquids since then.  More recently started vomiting up blood and spitting up blood.  On arrival, there was some irritation to the left posterior oropharynx with mild bleeding.  However when attempting to look further with a tongue depressor she vomited a large amount of straight blood from the esophagus.  Currently stable on exam but will get imaging to evaluate for esophageal tear or active bleeding at this time.  CT of chest shows distended and fluid-filled esophagus likely consistent with blood and cannot fully rule out active extravasation.  Messaged GI with Dr. Baldomero Bone.  She recommended rechecking hemoglobin now.  If stable then potentially could have EGD in the morning but if continuing to downtrend would likely need procedure tonight.  Hemoglobin went from 11.1 to 10.4.  Patient now having more frequent vomiting approximately every 15 to 20 minutes still with hematemesis.  Reach back out to GI team was  very concerned for potential decompensation as her blood pressure initially started in the 200s systolics and now downtrending down to 145.  GI has agreed to come in for EGD.  Initially wanted patient to go to ICU and intubation procedure to occur there.  ICU did not feel comfortable with patient coming until she had to have the procedure done.  Spoke with anesthesia over the phone who has agreed to bring patient to endoscopy suite for intubation and procedure and afterwards go to the  ICU.  The patient is on the cardiac monitor to evaluate for evidence of arrhythmia and/or significant heart rate changes. Clinical Course as of 09/08/23 0013  Thu Sep 07, 2023  2157 GI (Dr. Baldomero Bone) - recheck her hgb now.  If stable, plan for EGD in the morning.  If downtrending, likely will need procedure tonight for which she will need to be intubated and in an ICU bed. [DW]  2321 Spoke with GI again she is now having vomiting with blood approximately every 15 to 20 minutes.  Hemoglobin went from 11.1-10.4.  Concern for worsening of her symptoms and given her age and comorbidities, could have rapid deterioration even though vitals are okay at this time. [DW]    Clinical Course User Index [DW] Kandee Orion, MD     FINAL CLINICAL IMPRESSION(S) / ED DIAGNOSES   Final diagnoses:  Hematemesis with nausea     Rx / DC Orders   ED Discharge Orders     None        Note:  This document was prepared using Dragon voice recognition software and may include unintentional dictation errors.   Kandee Orion, MD 09/08/23 [phone]

## 2023-09-07 NOTE — ED Notes (Addendum)
 Pt had another episode of BRB emesis

## 2023-09-07 NOTE — ED Notes (Signed)
 Pt vomiting BRB in increasing increments throughout her time in ED, now at around every 10-20 minutes. Spoke with Baldomero Bone MD on the phone to make aware. Also made aware that BP dropped from 180s SBP to 140s SBP. This RN facilitated Vanga MD to speak with pt and family at bedside to go over procedure and sign consent.

## 2023-09-07 NOTE — ED Notes (Signed)
 Pt to CT via stretcher

## 2023-09-08 ENCOUNTER — Inpatient Hospital Stay

## 2023-09-08 ENCOUNTER — Emergency Department: Admitting: Certified Registered Nurse Anesthetist

## 2023-09-08 ENCOUNTER — Encounter: Payer: Self-pay | Admitting: Pulmonary Disease

## 2023-09-08 DIAGNOSIS — Z885 Allergy status to narcotic agent status: Secondary | ICD-10-CM | POA: Diagnosis not present

## 2023-09-08 DIAGNOSIS — I160 Hypertensive urgency: Secondary | ICD-10-CM | POA: Diagnosis present

## 2023-09-08 DIAGNOSIS — K920 Hematemesis: Principal | ICD-10-CM

## 2023-09-08 DIAGNOSIS — Z6841 Body Mass Index (BMI) 40.0 and over, adult: Secondary | ICD-10-CM | POA: Diagnosis not present

## 2023-09-08 DIAGNOSIS — Z66 Do not resuscitate: Secondary | ICD-10-CM | POA: Diagnosis present

## 2023-09-08 DIAGNOSIS — I2489 Other forms of acute ischemic heart disease: Secondary | ICD-10-CM | POA: Diagnosis present

## 2023-09-08 DIAGNOSIS — M810 Age-related osteoporosis without current pathological fracture: Secondary | ICD-10-CM | POA: Diagnosis present

## 2023-09-08 DIAGNOSIS — Z515 Encounter for palliative care: Secondary | ICD-10-CM | POA: Diagnosis not present

## 2023-09-08 DIAGNOSIS — D72829 Elevated white blood cell count, unspecified: Secondary | ICD-10-CM

## 2023-09-08 DIAGNOSIS — R0789 Other chest pain: Secondary | ICD-10-CM | POA: Diagnosis not present

## 2023-09-08 DIAGNOSIS — K449 Diaphragmatic hernia without obstruction or gangrene: Secondary | ICD-10-CM

## 2023-09-08 DIAGNOSIS — K219 Gastro-esophageal reflux disease without esophagitis: Secondary | ICD-10-CM | POA: Diagnosis present

## 2023-09-08 DIAGNOSIS — E785 Hyperlipidemia, unspecified: Secondary | ICD-10-CM | POA: Diagnosis present

## 2023-09-08 DIAGNOSIS — I110 Hypertensive heart disease with heart failure: Secondary | ICD-10-CM | POA: Diagnosis present

## 2023-09-08 DIAGNOSIS — E871 Hypo-osmolality and hyponatremia: Secondary | ICD-10-CM | POA: Diagnosis present

## 2023-09-08 DIAGNOSIS — R6510 Systemic inflammatory response syndrome (SIRS) of non-infectious origin without acute organ dysfunction: Secondary | ICD-10-CM | POA: Diagnosis present

## 2023-09-08 DIAGNOSIS — J4489 Other specified chronic obstructive pulmonary disease: Secondary | ICD-10-CM | POA: Diagnosis present

## 2023-09-08 DIAGNOSIS — R079 Chest pain, unspecified: Secondary | ICD-10-CM | POA: Diagnosis not present

## 2023-09-08 DIAGNOSIS — D62 Acute posthemorrhagic anemia: Secondary | ICD-10-CM | POA: Diagnosis present

## 2023-09-08 DIAGNOSIS — K2211 Ulcer of esophagus with bleeding: Secondary | ICD-10-CM | POA: Diagnosis present

## 2023-09-08 DIAGNOSIS — Z79899 Other long term (current) drug therapy: Secondary | ICD-10-CM | POA: Diagnosis not present

## 2023-09-08 DIAGNOSIS — I5032 Chronic diastolic (congestive) heart failure: Secondary | ICD-10-CM | POA: Diagnosis present

## 2023-09-08 DIAGNOSIS — K922 Gastrointestinal hemorrhage, unspecified: Secondary | ICD-10-CM | POA: Diagnosis not present

## 2023-09-08 DIAGNOSIS — Z89421 Acquired absence of other right toe(s): Secondary | ICD-10-CM | POA: Diagnosis not present

## 2023-09-08 DIAGNOSIS — F419 Anxiety disorder, unspecified: Secondary | ICD-10-CM | POA: Diagnosis present

## 2023-09-08 DIAGNOSIS — H9190 Unspecified hearing loss, unspecified ear: Secondary | ICD-10-CM | POA: Diagnosis present

## 2023-09-08 DIAGNOSIS — Z8249 Family history of ischemic heart disease and other diseases of the circulatory system: Secondary | ICD-10-CM | POA: Diagnosis not present

## 2023-09-08 DIAGNOSIS — Z90710 Acquired absence of both cervix and uterus: Secondary | ICD-10-CM | POA: Diagnosis not present

## 2023-09-08 DIAGNOSIS — Z7189 Other specified counseling: Secondary | ICD-10-CM | POA: Diagnosis not present

## 2023-09-08 DIAGNOSIS — Z85820 Personal history of malignant melanoma of skin: Secondary | ICD-10-CM | POA: Diagnosis not present

## 2023-09-08 LAB — BLOOD GAS, ARTERIAL
Acid-Base Excess: 0.6 mmol/L (ref 0.0–2.0)
Bicarbonate: 26.6 mmol/L (ref 20.0–28.0)
FIO2: 40 %
MECHVT: 330 mL
Mechanical Rate: 16
O2 Saturation: 98.4 %
PEEP: 5 cmH2O
Patient temperature: 37
pCO2 arterial: 47 mmHg (ref 32–48)
pH, Arterial: 7.36 (ref 7.35–7.45)
pO2, Arterial: 100 mmHg (ref 83–108)

## 2023-09-08 LAB — BASIC METABOLIC PANEL WITH GFR
Anion gap: 8 (ref 5–15)
BUN: 48 mg/dL — ABNORMAL HIGH (ref 8–23)
CO2: 25 mmol/L (ref 22–32)
Calcium: 8.5 mg/dL — ABNORMAL LOW (ref 8.9–10.3)
Chloride: 98 mmol/L (ref 98–111)
Creatinine, Ser: 0.76 mg/dL (ref 0.44–1.00)
GFR, Estimated: >60 mL/min (ref >60)
Glucose, Bld: 156 mg/dL — ABNORMAL HIGH (ref 70–99)
Potassium: 4.1 mmol/L (ref 3.5–5.1)
Sodium: 131 mmol/L — ABNORMAL LOW (ref 135–145)

## 2023-09-08 LAB — CBC
HCT: 27.7 % — ABNORMAL LOW (ref 36.0–46.0)
HCT: 31.5 % — ABNORMAL LOW (ref 36.0–46.0)
Hemoglobin: 9 g/dL — ABNORMAL LOW (ref 12.0–15.0)
Hemoglobin: 10.3 g/dL — ABNORMAL LOW (ref 12.0–15.0)
MCH: 29.7 pg (ref 26.0–34.0)
MCH: 30.7 pg (ref 26.0–34.0)
MCHC: 32.5 g/dL (ref 30.0–36.0)
MCHC: 32.7 g/dL (ref 30.0–36.0)
MCV: 91.4 fL (ref 80.0–100.0)
MCV: 94 fL (ref 80.0–100.0)
Platelets: 231 10*3/uL (ref 150–400)
Platelets: 256 10*3/uL (ref 150–400)
RBC: 3.03 MIL/uL — ABNORMAL LOW (ref 3.87–5.11)
RBC: 3.35 MIL/uL — ABNORMAL LOW (ref 3.87–5.11)
RDW: 15.3 % (ref 11.5–15.5)
RDW: 15.4 % (ref 11.5–15.5)
WBC: 13 10*3/uL — ABNORMAL HIGH (ref 4.0–10.5)
WBC: 14.7 10*3/uL — ABNORMAL HIGH (ref 4.0–10.5)
nRBC: 0 % (ref 0.0–0.2)
nRBC: 0 % (ref 0.0–0.2)

## 2023-09-08 LAB — TROPONIN I (HIGH SENSITIVITY)
Troponin I (High Sensitivity): 26 ng/L — ABNORMAL HIGH (ref <18)
Troponin I (High Sensitivity): 35 ng/L — ABNORMAL HIGH (ref <18)

## 2023-09-08 LAB — GLUCOSE, CAPILLARY
Glucose-Capillary: 152 mg/dL — ABNORMAL HIGH (ref 70–99)
Glucose-Capillary: 163 mg/dL — ABNORMAL HIGH (ref 70–99)
Glucose-Capillary: 158 mg/dL — ABNORMAL HIGH (ref 70–99)
Glucose-Capillary: 123 mg/dL — ABNORMAL HIGH (ref 70–99)
Glucose-Capillary: 132 mg/dL — ABNORMAL HIGH (ref 70–99)
Glucose-Capillary: 111 mg/dL — ABNORMAL HIGH (ref 70–99)

## 2023-09-08 LAB — PHOSPHORUS: Phosphorus: 3.4 mg/dL (ref 2.5–4.6)

## 2023-09-08 LAB — LACTIC ACID, PLASMA: Lactic Acid, Venous: 1.4 mmol/L (ref 0.5–1.9)

## 2023-09-08 LAB — MAGNESIUM: Magnesium: 1.7 mg/dL (ref 1.7–2.4)

## 2023-09-08 LAB — PROCALCITONIN: Procalcitonin: <0.1 ng/mL

## 2023-09-08 LAB — MRSA NEXT GEN BY PCR, NASAL: MRSA by PCR Next Gen: NOT DETECTED

## 2023-09-08 MED ORDER — SUCCINYLCHOLINE CHLORIDE 200 MG/10ML IV SOSY
PREFILLED_SYRINGE | INTRAVENOUS | Status: AC
  Filled 2023-09-08: qty 10

## 2023-09-08 MED ORDER — ORAL CARE MOUTH RINSE
15.0000 mL | OROMUCOSAL | Status: DC
  Administered 2023-09-08 – 2023-09-09 (×12): 15 mL via OROMUCOSAL

## 2023-09-08 MED ORDER — EPINEPHRINE 1 MG/10ML IJ SOSY
PREFILLED_SYRINGE | INTRAMUSCULAR | Status: AC
  Filled 2023-09-08: qty 10

## 2023-09-08 MED ORDER — PANTOPRAZOLE SODIUM 40 MG IV SOLR
40.0000 mg | INTRAVENOUS | Status: AC
  Administered 2023-09-08 (×2): 40 mg via INTRAVENOUS
  Filled 2023-09-08: qty 10

## 2023-09-08 MED ORDER — CHLORHEXIDINE GLUCONATE CLOTH 2 % EX PADS
6.0000 | MEDICATED_PAD | Freq: Every day | CUTANEOUS | Status: DC
  Administered 2023-09-08 – 2023-09-11 (×3): 6 via TOPICAL

## 2023-09-08 MED ORDER — FENTANYL CITRATE (PF) 100 MCG/2ML IJ SOLN
INTRAMUSCULAR | Status: DC | PRN
  Administered 2023-09-08 (×4): 25 ug via INTRAVENOUS

## 2023-09-08 MED ORDER — SODIUM CHLORIDE 0.9 % IV SOLN: INTRAVENOUS | Status: DC | PRN

## 2023-09-08 MED ORDER — EPHEDRINE SULFATE-NACL 50-0.9 MG/10ML-% IV SOSY
PREFILLED_SYRINGE | INTRAVENOUS | Status: DC | PRN
  Administered 2023-09-08: 5 mg via INTRAVENOUS

## 2023-09-08 MED ORDER — MAGNESIUM SULFATE 2 GM/50ML IV SOLN
2.0000 g | Freq: Once | INTRAVENOUS | Status: AC
  Administered 2023-09-08: 2 g via INTRAVENOUS
  Filled 2023-09-08: qty 50

## 2023-09-08 MED ORDER — PROPOFOL 1000 MG/100ML IV EMUL
INTRAVENOUS | Status: AC
  Filled 2023-09-08: qty 100

## 2023-09-08 MED ORDER — EPINEPHRINE 1 MG/10ML IJ SOSY
PREFILLED_SYRINGE | INTRAMUSCULAR | Status: DC | PRN
  Administered 2023-09-08: .6 mg via SUBCUTANEOUS

## 2023-09-08 MED ORDER — DEXAMETHASONE SODIUM PHOSPHATE 10 MG/ML IJ SOLN
INTRAMUSCULAR | Status: DC | PRN
Start: 2023-09-08 — End: 2023-09-08
  Administered 2023-09-08: 5 mg via INTRAVENOUS

## 2023-09-08 MED ORDER — LIDOCAINE HCL URETHRAL/MUCOSAL 2 % EX GEL
1.0000 | Freq: Once | CUTANEOUS | Status: AC
  Administered 2023-09-08: 1 via TOPICAL
  Filled 2023-09-08: qty 5

## 2023-09-08 MED ORDER — PHENYLEPHRINE 80 MCG/ML (10ML) SYRINGE FOR IV PUSH (FOR BLOOD PRESSURE SUPPORT)
PREFILLED_SYRINGE | INTRAVENOUS | Status: DC | PRN
  Administered 2023-09-08 (×6): 80 ug via INTRAVENOUS

## 2023-09-08 MED ORDER — SODIUM CHLORIDE 0.9 % IV SOLN: 250.0000 mL | INTRAVENOUS | Status: AC

## 2023-09-08 MED ORDER — PROPOFOL 1000 MG/100ML IV EMUL
0.0000 ug/kg/min | INTRAVENOUS | Status: DC
  Administered 2023-09-08: 15 ug/kg/min via INTRAVENOUS
  Administered 2023-09-08 – 2023-09-09 (×2): 20 ug/kg/min via INTRAVENOUS
  Filled 2023-09-08 (×3): qty 100

## 2023-09-08 MED ORDER — PANTOPRAZOLE SODIUM 40 MG IV SOLR
40.0000 mg | Freq: Two times a day (BID) | INTRAVENOUS | Status: DC
  Administered 2023-09-11 – 2023-09-12 (×3): 40 mg via INTRAVENOUS
  Filled 2023-09-08 (×3): qty 10

## 2023-09-08 MED ORDER — FENTANYL CITRATE PF 50 MCG/ML IJ SOSY
50.0000 ug | PREFILLED_SYRINGE | Freq: Once | INTRAMUSCULAR | Status: AC
  Administered 2023-09-08: 50 ug via INTRAMUSCULAR
  Filled 2023-09-08: qty 1

## 2023-09-08 MED ORDER — SODIUM CHLORIDE 0.9 % IV SOLN
1.0000 g | INTRAVENOUS | Status: DC
  Administered 2023-09-08 – 2023-09-10 (×3): 1 g via INTRAVENOUS
  Filled 2023-09-08 (×3): qty 10

## 2023-09-08 MED ORDER — LIDOCAINE HCL (PF) 2 % IJ SOLN
INTRAMUSCULAR | Status: AC
  Filled 2023-09-08: qty 5

## 2023-09-08 MED ORDER — CHLORHEXIDINE GLUCONATE CLOTH 2 % EX PADS: 6.0000 | MEDICATED_PAD | Freq: Every day | CUTANEOUS | Status: DC

## 2023-09-08 MED ORDER — FENTANYL CITRATE PF 50 MCG/ML IJ SOSY
25.0000 ug | PREFILLED_SYRINGE | INTRAMUSCULAR | Status: AC | PRN
  Administered 2023-09-08 – 2023-09-09 (×3): 25 ug via INTRAVENOUS
  Filled 2023-09-08 (×3): qty 1

## 2023-09-08 MED ORDER — ONDANSETRON HCL 4 MG/2ML IJ SOLN: 4.0000 mg | Freq: Four times a day (QID) | INTRAMUSCULAR | Status: DC | PRN

## 2023-09-08 MED ORDER — LIDOCAINE HCL (CARDIAC) PF 100 MG/5ML IV SOSY
PREFILLED_SYRINGE | INTRAVENOUS | Status: DC | PRN
  Administered 2023-09-08: 60 mg via INTRAVENOUS

## 2023-09-08 MED ORDER — PROPOFOL 10 MG/ML IV BOLUS
INTRAVENOUS | Status: DC | PRN
  Administered 2023-09-08: 20 mg via INTRAVENOUS
  Administered 2023-09-08 (×2): 40 mg via INTRAVENOUS
  Administered 2023-09-08: 20 mg via INTRAVENOUS
  Administered 2023-09-08: 30 mg via INTRAVENOUS

## 2023-09-08 MED ORDER — METOCLOPRAMIDE HCL 5 MG/ML IJ SOLN
5.0000 mg | Freq: Three times a day (TID) | INTRAMUSCULAR | Status: AC
  Administered 2023-09-08 (×3): 5 mg via INTRAVENOUS
  Filled 2023-09-08 (×3): qty 2

## 2023-09-08 MED ORDER — PANTOPRAZOLE SODIUM 40 MG IV SOLR
40.0000 mg | Freq: Three times a day (TID) | INTRAVENOUS | Status: AC
  Administered 2023-09-08 – 2023-09-11 (×9): 40 mg via INTRAVENOUS
  Filled 2023-09-08 (×9): qty 10

## 2023-09-08 MED ORDER — DOCUSATE SODIUM 100 MG PO CAPS: 100.0000 mg | ORAL_CAPSULE | Freq: Two times a day (BID) | ORAL | Status: DC | PRN

## 2023-09-08 MED ORDER — MIDAZOLAM HCL 2 MG/2ML IJ SOLN
2.0000 mg | Freq: Once | INTRAMUSCULAR | Status: AC
  Administered 2023-09-08: 2 mg via INTRAMUSCULAR
  Filled 2023-09-08: qty 2

## 2023-09-08 MED ORDER — SUCCINYLCHOLINE CHLORIDE 200 MG/10ML IV SOSY
PREFILLED_SYRINGE | INTRAVENOUS | Status: DC | PRN
  Administered 2023-09-08: 80 mg via INTRAVENOUS

## 2023-09-08 MED ORDER — LACTATED RINGERS IV SOLN: INTRAVENOUS | Status: DC | PRN

## 2023-09-08 MED ORDER — PANTOPRAZOLE SODIUM 40 MG IV SOLR: 40.0000 mg | Freq: Two times a day (BID) | INTRAVENOUS | Status: DC

## 2023-09-08 MED ORDER — NOREPINEPHRINE 4 MG/250ML-% IV SOLN
2.0000 ug/min | INTRAVENOUS | Status: DC
  Administered 2023-09-08: 2 ug/min via INTRAVENOUS
  Filled 2023-09-08: qty 250

## 2023-09-08 MED ORDER — ONDANSETRON HCL 4 MG/2ML IJ SOLN
INTRAMUSCULAR | Status: DC | PRN
Start: 2023-09-08 — End: 2023-09-08
  Administered 2023-09-08: 4 mg via INTRAVENOUS

## 2023-09-08 MED ORDER — FENTANYL CITRATE (PF) 100 MCG/2ML IJ SOLN
INTRAMUSCULAR | Status: AC
  Filled 2023-09-08: qty 2

## 2023-09-08 MED ORDER — FENTANYL CITRATE PF 50 MCG/ML IJ SOSY
25.0000 ug | PREFILLED_SYRINGE | INTRAMUSCULAR | Status: DC | PRN
  Administered 2023-09-08 (×2): 50 ug via INTRAVENOUS
  Filled 2023-09-08 (×2): qty 1

## 2023-09-08 MED ORDER — ORAL CARE MOUTH RINSE: 15.0000 mL | OROMUCOSAL | Status: DC | PRN

## 2023-09-08 MED ORDER — DOCUSATE SODIUM 50 MG/5ML PO LIQD: 100.0000 mg | Freq: Two times a day (BID) | ORAL | Status: DC

## 2023-09-08 MED ORDER — POLYETHYLENE GLYCOL 3350 17 G PO PACK: 17.0000 g | PACK | Freq: Every day | ORAL | Status: DC | PRN

## 2023-09-08 MED ORDER — POLYETHYLENE GLYCOL 3350 17 G PO PACK: 17.0000 g | PACK | Freq: Every day | ORAL | Status: DC

## 2023-09-08 NOTE — OR Nursing (Signed)
 Pt transported from ED TO OR ROOM 9. PT REPORTING PAIN ABOVE IV SITE WHICH IS SWOLLEN AND VERY TENDER. UPON ARRIVAL TO OR ROOM. IV SITE REMOVED AND HEAT APPLIED PRIOR TO INTUBATION. ALL FAMILY SENT TO WAITING ROOM

## 2023-09-08 NOTE — H&P (Signed)
 NAME:  Cindy Graham, MRN:  [id number], DOB:  January 08, 1922, LOS: 0 ADMISSION DATE:  09/07/2023, CONSULTATION DATE:  09/08/23 REFERRING MD:  Dr. Karlynn Oyster, CHIEF COMPLAINT: Hematemesis    History of Present Illness:  89 yo F presenting to [facility] ED from [facility] via EMS for evaluation of hematemesis.  History obtained per chart review and patient/ family bedside report prior to procedure. Patient reported being in her normal state of health until breakfast on 4/17 after eating a waffle and eggs at which time she felt as though she had scratched her throat. Since that time she had been tolerating liquids but had not tried any solids. An hour prior to arriving in the ED, around 18:00 the patient began vomiting blood. She also endorsed squeezing 4/10 lower medial chest discomfort. She denies blood thinners, denied nausea/ abdominal pain/ diarrhea, denied dyspnea, fever/ chills, blurred vision, headache, dizziness, LOC or fall.  ED course: Upon arrival patient alert and responsive, hypertensive urgency with SBP > 200 but otherwise stable vitals on RA. Medications given: IV contrast, Zofran  & protonix  Initial Vitals: 98.2, 18, 80, 214/72 & 99% on RA Significant labs: (Labs/ Imaging personally reviewed) I, Recardo Canal Rust[place], AGACNP-BC, personally viewed and interpreted this ECG. EKG Interpretation: pending Chemistry: Na+: 131, K+: 4.4, BUN/Cr.: 41/ 0.73, Serum CO2/ AG: 24/ 9 Hematology: WBC: 14.8, Hgb: 11.1 > 10.9,  Troponin: pending, Lactic: pending  ABG: pending CXR 09/07/23: bibasilar atelectasis or scarring CT soft tissue neck w contrast 09/07/23: Distended fluid-filled esophagus with layering hyperdensity, suggestive of hemorrhage. A CTA could better assess for active extravasation if clinically warranted. No radiopaque foreign body or significant edema identified. Small right pleural effusion. CT chest w contrast 09/07/23: Distended, fluid-filled esophagus. Area of high density noted along  the posterior wall in the upper thoracic esophagus near the thoracic inlet. Cannot exclude active extravasation of contrast. Small bilateral pleural effusions.  Bibasilar scarring. Coronary artery disease. Aortic Atherosclerosis  PCCM consulted for admission due to acute upper gastrointestinal bleeding s/p emergent endoscopy requiring post-operative mechanical ventilatory support.  Pertinent  Medical History  Asthma & COPD HLD HTN Melanoma GERD Anxiety Arthritis  Significant Hospital Events: Including procedures, antibiotic start and stop dates in addition to other pertinent events   09/08/23: Admit to ICU with upper gastrointestinal bleeding s/p emergent endoscopy requiring post-operative mechanical ventilatory support.  Interim History / Subjective:  Patient seen pre-op alert and awake, plan of care discussed- all questions and concerns answered at this time. Post-op patient intubated and sedated, family updated on current clinical status.  Objective   Blood pressure (!) 163/68, pulse 89, temperature 98.3 F (36.8 C), temperature source Oral, resp. rate (!) 22, height 5\' 3"  (1.6 m), weight 60.8 kg, SpO2 98%.        Intake/Output Summary (Last 24 hours) at 09/08/2023 0231 Last data filed at 09/08/2023 0211 Gross per 24 hour  Intake 500 ml  Output --  Net 500 ml   Filed Weights   09/07/23 1938  Weight: 60.8 kg    Examination: General: Adult female, critically ill, lying in bed intubated & sedated requiring mechanical ventilation, NAD HEENT: MM pink/moist, anicteric, atraumatic, neck supple Neuro: RASS - 4, unable to follow commands, PERRL +3, MAE CV: s1s2 RRR, NSR on monitor, no r/m/g Pulm: Regular, non labored on diminished throughout GI: soft, rounded, non tender, bs x 4 GU: foley in place with clear yellow urine Skin: extensive ecchymosis on BUE, one infiltrated IV and on LUE hematoma from attempted PIV  placement   (In order: RUE infiltration of LR, LUE attempted PIV  placement, LLE chronic wound) Extremities: warm/dry, pulses + 2 R/P, +1 edema noted BLE  Resolved Hospital Problem list     Assessment & Plan:  Acute Upper Gastrointestinal Bleed secondary to large cratered esophageal ulcer & erosive esophagitis s/p EGD  - high dose Protonix  & Reglan  Q 8 initiated by GI - Maintain up to date type & screen - continuous cardiac monitoring - NPO - Monitor for s/s of bleeding, serial H&H - Daily CBC, PT/ INR monitoring PRN - Transfuse for Hgb <7 - Consider vasopressors to maintain MAP< 65: norepinephrine /vasopressin - GI consulted, appreciate input  Post Operative Mechanical Ventilation PMHx: COPD, asthma - Ventilator settings: PRVC  8 mL/kg, 40% FiO2, 5 PEEP, continue ventilator support & lung protective strategies - Wean PEEP & FiO2 as tolerated, maintain SpO2 > 90% - Head of bed elevated 30 degrees, VAP protocol in place - Plateau pressures less than 30 cm H20  - Intermittent chest x-ray & ABG PRN - Daily WUA with SBT as tolerated  - Ensure adequate pulmonary hygiene  - bronchodilators PRN - PAD protocol in place: continue Fentanyl  IVP & Propofol  drip  Leukocytosis suspect reactive in the setting of bleeding - Daily CBC, monitor WBC/ fever curve - trend lactic /PCT - consider initiating antibiotics PRN  Chest Discomfort PMHx: HTN, HLD - f/u EKG - trend troponin - hold outpatient regimen while NPO, consider restarting if patient stabilizes - IV PRN to maintain SBP > 90 and <160, MAP > 65  Best Practice (right click and "Reselect all SmartList Selections" daily)  Diet/type: NPO DVT prophylaxis SCD Pressure ulcer(s): N/A GI prophylaxis: PPI Lines: N/A Foley:  Yes, and it is still needed Code Status:  DNR Last date of multidisciplinary goals of care discussion [09/08/23- see ipal note]  Labs   CBC: Recent Labs  Lab 09/07/23 1953 09/07/23 2233  WBC 14.8* 14.7*  NEUTROABS 12.4* 12.3*  HGB 11.1* 10.4*  HCT 34.1* 32.4*  MCV 93.4  95.3  PLT 236 230    Basic Metabolic Panel: Recent Labs  Lab 09/07/23 1953  NA 131*  K 4.4  CL 98  CO2 24  GLUCOSE 163*  BUN 41*  CREATININE 0.73  CALCIUM  8.9   GFR: Estimated Creatinine Clearance: 30.9 mL/min (by C-G formula based on SCr of 0.73 mg/dL). Recent Labs  Lab 09/07/23 1953 09/07/23 2233  WBC 14.8* 14.7*    Liver Function Tests: Recent Labs  Lab 09/07/23 1953  AST 27  ALT 15  ALKPHOS 59  BILITOT 0.9  PROT 7.0  ALBUMIN  3.2*   No results for input(s): "LIPASE", "AMYLASE" in the last 168 hours. No results for input(s): "AMMONIA" in the last 168 hours.  ABG No results found for: "PHART", "PCO2ART", "PO2ART", "HCO3", "TCO2", "ACIDBASEDEF", "O2SAT"   Coagulation Profile: Recent Labs  Lab 09/07/23 2023  INR 1.1    Cardiac Enzymes: No results for input(s): "CKTOTAL", "CKMB", "CKMBINDEX", "TROPONINI" in the last 168 hours.  HbA1C: Hemoglobin A1C  Date/Time Value Ref Range Status  03/14/2013 04:44 PM 6.0 4.2 - 6.3 % Final    Comment:    The American Diabetes Association recommends that a primary goal of therapy should be <7% and that physicians should reevaluate the treatment regimen in patients with HbA1c values consistently >8%.    Hgb A1c MFr Bld  Date/Time Value Ref Range Status  11/02/2021 04:16 AM 5.7 (H) 4.8 - 5.6 % Final    Comment:    (  NOTE) Pre diabetes:          5.7%-6.4%  Diabetes:              >6.4%  Glycemic control for   <7.0% adults with diabetes   03/17/2020 07:04 AM 5.5 4.8 - 5.6 % Final    Comment:    (NOTE)         Prediabetes: 5.7 - 6.4         Diabetes: >6.4         Glycemic control for adults with diabetes: <7.0     CBG: No results for input(s): "GLUCAP" in the last 168 hours.  Review of Systems: positives in BOLD  Gen: Denies fever, chills, weight change, fatigue, night sweats HEENT: Denies blurred vision, double vision, hearing loss, tinnitus, sinus congestion, rhinorrhea, sore throat, neck stiffness,  dysphagia PULM: Denies shortness of breath, cough, sputum production, hemoptysis, wheezing CV: Denies chest pain, edema, orthopnea, paroxysmal nocturnal dyspnea, palpitations GI: Denies abdominal pain, nausea, vomiting, diarrhea, hematemesis, melena, constipation, change in bowel habits GU: Denies dysuria, hematuria, polyuria, oliguria, urethral discharge Endocrine: Denies hot or cold intolerance, polyuria, polyphagia or appetite change Derm: Denies rash, dry skin, scaling or peeling skin change Heme: Denies easy bruising, bleeding, bleeding gums Neuro: Denies headache, numbness, weakness, slurred speech, loss of memory or consciousness  Past Medical History:  She,  has a past medical history of Anxiety, Arthritis, Asthma, COPD (chronic obstructive pulmonary disease) (HCC), GERD (gastroesophageal reflux disease), Heart murmur, Hernia, Hyperlipidemia, Hypertension, Melanoma (HCC), Syncope and collapse, and Urine incontinence.   Surgical History:   Past Surgical History:  Procedure Laterality Date   ABDOMINAL HYSTERECTOMY     AMPUTATION TOE Right 06/14/2018   Procedure: AMPUTATION TOE - RIGHT 5TH;  Surgeon: Sharlyn Deaner, DPM;  Location: [facility] ORS;  Service: Podiatry;  Laterality: Right;   CHOLECYSTECTOMY     HERNIA REPAIR     INTRAMEDULLARY (IM) NAIL INTERTROCHANTERIC Right 06/22/2019   Procedure: INTRAMEDULLARY (IM) NAIL INTERTROCHANTRIC;  Surgeon: Wes Hamman, MD;  Location: [facility] OR;  Service: Orthopedics;  Laterality: Right;     Social History:   reports that she has never smoked. She has never used smokeless tobacco. She reports that she does not drink alcohol and does not use drugs.   Family History:  Her family history includes Cancer in her brother and sister; Heart attack in her brother and son; Heart disease in her brother and sister; Parkinson's disease in her son; Stroke in her father.   Allergies Allergies  Allergen Reactions   Statins Other (See Comments)    Very bad  muscle aches   Tramadol Itching   Levaquin  [Levofloxacin  In D5w] Diarrhea and Other (See Comments)    Confusion, hallucinations   Neomycin-Polymyxin-Gramicidin Other (See Comments)   Penicillins     Tolerates cephalosporins Did it involve swelling of the face/tongue/throat, SOB, or low BP? Unknown Did it involve sudden or severe rash/hives, skin peeling, or any reaction on the inside of your mouth or nose? Unknown Did you need to seek medical attention at a hospital or doctor's office? Unknown When did it last happen? Unknown If all above answers are "NO", may proceed with cephalosporin use.   Sulfa Antibiotics    Tetracyclines & Related      Home Medications  Prior to Admission medications   Medication Sig Start Date End Date Taking? Authorizing Provider  alendronate  (FOSAMAX ) 70 MG tablet Take 70 mg by mouth every 7 (seven) days. monday    [provider]  enalapril  (VASOTEC ) 20 MG tablet Take 20 mg by mouth daily. 01/31/20   [provider]  feeding supplement, ENSURE ENLIVE, (ENSURE ENLIVE) LIQD Take 237 mLs by mouth 2 (two) times daily between meals. 06/25/19   Regalado, Belkys A, MD  FLONASE  ALLERGY RELIEF 50 MCG/ACT nasal spray Place 2 sprays into both nostrils daily. 08/16/21   [provider]  guaifenesin  (ROBITUSSIN) 100 MG/5ML syrup Take 200 mg by mouth every 6 (six) hours as needed for cough.    [provider]  lactulose  (CHRONULAC ) 10 GM/15ML solution Take 15 mLs by mouth daily as needed. 10/23/21   [provider]  Menthol , Topical Analgesic, 4 % GEL Apply 1 Application topically 3 (three) times daily as needed. Apply to affected area of legs and neck    [provider]  mirabegron  ER (MYRBETRIQ ) 25 MG TB24 tablet Take 1 tablet (25 mg total) by mouth daily. 01/12/21   Vaillancourt, Samantha, PA-C  nitroGLYCERIN  (NITROSTAT ) 0.4 MG SL tablet Place 0.4 mg under the tongue every 5 (five) minutes as needed for chest pain.     [provider]  nystatin  cream (MYCOSTATIN ) Apply 1 application  topically as needed (panniculitis). Apply to pannus    [provider]  omeprazole  (PRILOSEC) 20 MG capsule TAKE 1 CAPSULE BY MOUTH EVERY DAY. 11/15/17   Kent Pear, MD  polyethylene glycol (MIRALAX  / GLYCOLAX ) 17 g packet Take 17 g by mouth daily. 06/26/19   Regalado, Belkys A, MD  propranolol  (INDERAL ) 40 MG tablet Take 0.5 tablets (20 mg total) by mouth 2 (two) times daily. 02/28/23   Patel, Sona, MD  Psyllium 400 MG CAPS Take 0.4 g by mouth in the morning and at bedtime.    [provider]  senna (SENOKOT) 8.6 MG TABS tablet Take 1 tablet by mouth at bedtime.    [provider]  sertraline  (ZOLOFT ) 100 MG tablet TAKE ONE TABLET BY MOUTH EVERY DAY 11/15/17   Kent Pear, MD  SM ASPIRIN  LOW DOSE 81 MG chewable tablet Chew 1 tablet (81 mg total) by mouth daily. 03/07/23   Patel, Sona, MD  sodium chloride  (OCEAN) 0.65 % nasal spray Place 1 spray into the nose every 2 (two) hours as needed for congestion. 09/06/21   [provider]  tapentadol  (NUCYNTA ) 50 MG tablet Take 50 mg by mouth 3 (three) times daily.    [provider]  VENTOLIN  HFA 108 (90 Base) MCG/ACT inhaler Inhale 2 puffs into the lungs every 4 (four) hours as needed. 12/04/20   [provider]  verapamil  (VERELAN ) 180 MG 24 hr capsule Take 1 capsule (180 mg total) by mouth at bedtime. 02/28/23   Patel, Sona, MD     Critical care time: 25 minutes      Eliott Guess, AGACNP-BC Acute Care Nurse Practitioner  Pulmonary & Critical Care   [phone] / [phone] Please see Amion for details.

## 2023-09-08 NOTE — IPAL (Signed)
  Interdisciplinary Goals of Care Family Meeting   Date carried out: 09/08/2023  Location of the meeting: Bedside  Member's involved: Nurse Practitioner and Family Member or next of kin  Durable Power of Attorney or acting medical decision maker: patient, [facility] with daughter and other family members present.  Discussion: We discussed goals of care for Cindy Graham. The patient was alert and present and we discussed previous CODE status of DNR/DNI. We discussed the need for intubation and ventilatory support during the endoscopy procedure and that she will have to be a FULL code at that time. She confirmed this was fine as long as the ventilatory support was short term. We discussed whether or not she would like to remain a DNR post procedure and she confirmed in front of family that post procedure she would like to be a DNR code status  Code status:   Code Status: Full Code for surgery, patient will be transitioned back to DNR post procedure.  Disposition: Continue current acute care  Time spent for the meeting: 15 minutes  Eliott Guess, AGACNP-BC Acute Care Nurse Practitioner [place] [facility]   [phone] / [phone] Please see Amion for details.

## 2023-09-08 NOTE — ED Notes (Addendum)
 Report given to ICU RN and Endoscopy RN. OR team at bedside now to take pt down

## 2023-09-08 NOTE — Plan of Care (Signed)
  Problem: Clinical Measurements: Goal: Ability to maintain clinical measurements within normal limits will improve Outcome: Progressing Goal: Will remain free from infection Outcome: Progressing   Problem: Coping: Goal: Level of anxiety will decrease Outcome: Progressing   Problem: Elimination: Goal: Will not experience complications related to urinary retention Outcome: Progressing   Problem: Pain Managment: Goal: General experience of comfort will improve and/or be controlled Outcome: Progressing   Problem: Safety: Goal: Ability to remain free from injury will improve Outcome: Progressing   Problem: Education: Goal: Knowledge of General Education information will improve Description: Including pain rating scale, medication(s)/side effects and non-pharmacologic comfort measures Outcome: Not Progressing   Problem: Health Behavior/Discharge Planning: Goal: Ability to manage health-related needs will improve Outcome: Not Progressing   Problem: Activity: Goal: Risk for activity intolerance will decrease Outcome: Not Progressing   Problem: Nutrition: Goal: Adequate nutrition will be maintained Outcome: Not Progressing   Problem: Activity: Goal: Ability to tolerate increased activity will improve Outcome: Not Progressing   Problem: Role Relationship: Goal: Method of communication will improve Outcome: Not Progressing

## 2023-09-08 NOTE — Anesthesia Procedure Notes (Addendum)
 Procedure Name: Intubation Date/Time: 09/08/2023 1:55 AM  Performed by: Delice Felt, CRNAPre-anesthesia Checklist: Patient identified, Patient being monitored, Timeout performed, Emergency Drugs available and Suction available Patient Re-evaluated:Patient Re-evaluated prior to induction Oxygen  Delivery Method: Circle system utilized Preoxygenation: Pre-oxygenation with 100% oxygen  Induction Type: IV induction and Rapid sequence Laryngoscope Size: 3 and McGrath Grade View: Grade I Tube type: Oral Tube size: 7.5 mm Number of attempts: 1 Airway Equipment and Method: Stylet Placement Confirmation: ETT inserted through vocal cords under direct vision, positive ETCO2 and breath sounds checked- equal and bilateral Secured at: 20 cm Tube secured with: Tape Dental Injury: Teeth and Oropharynx as per pre-operative assessment  Comments: Blood at esophageal opening, none visualized entering larynx.

## 2023-09-08 NOTE — ED Notes (Signed)
 Pt urinated x1, diaper changed, linens changed and clean gown placed on pt. Gray socks placed on pt.   Pt's pink dress, bra, pair of socks, and pair of black shoes placed into 2 belongings bags and given to family.

## 2023-09-08 NOTE — Consult Note (Signed)
 Karma Oz, MD [address]  [address]  [place], [place] [address]  Main: [phone]  Fax: [phone] Pager: [phone]   Consultation  Referring Provider:     No ref. provider found Primary Care Physician:  [facility] Primary Gastroenterologist: Para Bold         Reason for Consultation: Hematemesis  Date of Admission:  09/07/2023 Date of Consultation:  09/08/2023         HPI:   Cindy Graham is a 89 y.o. female with history of COPD, hypertension, hyperlipidemia presented from [facility] [place] [facility] secondary to gagging and spitting up bright red blood with clots, small to moderate amounts.  Patient is accompanied by her whole family including her daughter and granddaughter.  Patient had pancake and eggs this morning and she felt something got stuck.  She tried liquids and tolerated but has not tried any solids.  She was noticed to to start spitting up blood at 5 PM and has been doing that every 20 to 25 minutes.  Her vitals were stable in the ER, underwent CT chest with contrast which revealed distended fluid-filled esophagus with area of high density in the posterior wall of the upper thoracic esophagus near the thoracic inlet.  Bilateral pleural effusions.  She also underwent CT soft tissue neck with contrast which did not reveal any evidence of radiopaque foreign body.  GI is consulted for further evaluation.  Hemoglobin on arrival was 11.1, normal MCV, normal platelets, normal PT/INR, LFTs, mildly elevated BUN/creatinine 41/0.73.  Repeat CBC showed hemoglobin of 10.4.  Takes omeprazole  40 mg once a day No similar episodes in the past  NSAIDs: None  Antiplts/Anticoagulants/Anti thrombotics: None  GI Procedures: None  Past Medical History:  Diagnosis Date   Anxiety    Arthritis    osteo   Asthma    COPD (chronic obstructive pulmonary disease) (HCC)    GERD (gastroesophageal reflux disease)    Heart murmur    Hernia    Hyperlipidemia     Hypertension    Melanoma (HCC)    face   Syncope and collapse    Urine incontinence     Past Surgical History:  Procedure Laterality Date   ABDOMINAL HYSTERECTOMY     AMPUTATION TOE Right 06/14/2018   Procedure: AMPUTATION TOE - RIGHT 5TH;  Surgeon: Sharlyn Deaner, DPM;  Location: [facility] ORS;  Service: Podiatry;  Laterality: Right;   CHOLECYSTECTOMY     HERNIA REPAIR     INTRAMEDULLARY (IM) NAIL INTERTROCHANTERIC Right 06/22/2019   Procedure: INTRAMEDULLARY (IM) NAIL INTERTROCHANTRIC;  Surgeon: Wes Hamman, MD;  Location: [facility] OR;  Service: Orthopedics;  Laterality: Right;     Current Facility-Administered Medications:    docusate sodium  (COLACE) capsule 100 mg, 100 mg, Oral, BID PRN, Rust-Chester, Britton L, NP   ondansetron  (ZOFRAN ) injection 4 mg, 4 mg, Intravenous, Q6H PRN, Rust-Chester, Britton L, NP   pantoprazole  (PROTONIX ) injection 40 mg, 40 mg, Intravenous, Q12H, Nakeia Calvi Reddy, MD   polyethylene glycol (MIRALAX  / GLYCOLAX ) packet 17 g, 17 g, Oral, Daily PRN, Rust-Chester, Gara July, NP  Current Outpatient Medications:    alendronate  (FOSAMAX ) 70 MG tablet, Take 70 mg by mouth every 7 (seven) days. monday, Disp: , Rfl:    enalapril  (VASOTEC ) 20 MG tablet, Take 20 mg by mouth daily., Disp: , Rfl:    feeding supplement, ENSURE ENLIVE, (ENSURE ENLIVE) LIQD, Take 237 mLs by mouth 2 (two) times daily between meals., Disp: 237 mL, Rfl: 12  FLONASE  ALLERGY RELIEF 50 MCG/ACT nasal spray, Place 2 sprays into both nostrils daily., Disp: , Rfl:    guaifenesin  (ROBITUSSIN) 100 MG/5ML syrup, Take 200 mg by mouth every 6 (six) hours as needed for cough., Disp: , Rfl:    lactulose  (CHRONULAC ) 10 GM/15ML solution, Take 15 mLs by mouth daily as needed., Disp: , Rfl:    Menthol , Topical Analgesic, 4 % GEL, Apply 1 Application topically 3 (three) times daily as needed. Apply to affected area of legs and neck, Disp: , Rfl:    mirabegron  ER (MYRBETRIQ ) 25 MG TB24 tablet, Take 1 tablet (25  mg total) by mouth daily., Disp: 30 tablet, Rfl: 11   nitroGLYCERIN  (NITROSTAT ) 0.4 MG SL tablet, Place 0.4 mg under the tongue every 5 (five) minutes as needed for chest pain., Disp: , Rfl:    nystatin  cream (MYCOSTATIN ), Apply 1 application  topically as needed (panniculitis). Apply to pannus, Disp: , Rfl:    omeprazole  (PRILOSEC) 20 MG capsule, TAKE 1 CAPSULE BY MOUTH EVERY DAY., Disp: 90 capsule, Rfl: 1   polyethylene glycol (MIRALAX  / GLYCOLAX ) 17 g packet, Take 17 g by mouth daily., Disp: 14 each, Rfl: 0   propranolol  (INDERAL ) 40 MG tablet, Take 0.5 tablets (20 mg total) by mouth 2 (two) times daily., Disp: , Rfl:    Psyllium 400 MG CAPS, Take 0.4 g by mouth in the morning and at bedtime., Disp: , Rfl:    senna (SENOKOT) 8.6 MG TABS tablet, Take 1 tablet by mouth at bedtime., Disp: , Rfl:    sertraline  (ZOLOFT ) 100 MG tablet, TAKE ONE TABLET BY MOUTH EVERY DAY, Disp: 90 tablet, Rfl: 1   SM ASPIRIN  LOW DOSE 81 MG chewable tablet, Chew 1 tablet (81 mg total) by mouth daily., Disp: 30 tablet, Rfl: 0   sodium chloride  (OCEAN) 0.65 % nasal spray, Place 1 spray into the nose every 2 (two) hours as needed for congestion., Disp: , Rfl:    tapentadol  (NUCYNTA ) 50 MG tablet, Take 50 mg by mouth 3 (three) times daily., Disp: , Rfl:    VENTOLIN  HFA 108 (90 Base) MCG/ACT inhaler, Inhale 2 puffs into the lungs every 4 (four) hours as needed., Disp: , Rfl:    verapamil  (VERELAN ) 180 MG 24 hr capsule, Take 1 capsule (180 mg total) by mouth at bedtime., Disp: 30 capsule, Rfl: 0   Family History  Problem Relation Age of Onset   Stroke Father    Heart disease Sister    Cancer Brother        colon   Heart disease Brother    Cancer Sister        colon   Heart attack Brother    Heart attack Son    Parkinson's disease Son      Social History   Tobacco Use   Smoking status: Never   Smokeless tobacco: Never  Substance Use Topics   Alcohol use: No   Drug use: No    Allergies as of 09/07/2023 -  Review Complete 09/07/2023  Allergen Reaction Noted   Statins Other (See Comments) 03/19/2013   Tramadol Itching 03/19/2013   Levaquin  [levofloxacin  in d5w] Diarrhea and Other (See Comments) 11/25/2017   Neomycin-polymyxin-gramicidin Other (See Comments) 01/29/2014   Penicillins  02/22/2019   Sulfa antibiotics  02/21/2011   Tetracyclines & related  02/21/2011    Review of Systems:    All systems reviewed and negative except where noted in HPI.   Physical Exam:  Vital signs in last  24 hours: Temp:  [98.2 F (36.8 C)-98.3 F (36.8 C)] 98.3 F (36.8 C) (04/18 0000) Pulse Rate:  [80-92] 89 (04/18 0030) Resp:  [16-24] 22 (04/18 0030) BP: (145-214)/(65-82) 163/68 (04/18 0030) SpO2:  [96 %-99 %] 98 % (04/18 0030) Weight:  [60.8 kg] 60.8 kg (04/17 1938)   General:   Thin built, alert, pleasant, cooperative in NAD Head:  Normocephalic and atraumatic. Eyes:   No icterus.   Conjunctiva pink. PERRLA. Ears:  Normal auditory acuity. Neck:  Supple; no masses or thyroidomegaly Lungs: Respirations even and unlabored. Lungs clear to auscultation bilaterally.   No wheezes, crackles, or rhonchi.  Heart:  Regular rate and rhythm;  Without murmur, clicks, rubs or gallops Abdomen:  Soft, nondistended, nontender. Normal bowel sounds. No appreciable masses or hepatomegaly.  No rebound or guarding.  Rectal:  Not performed. Msk:  Symmetrical without gross deformities.  Strength generalized weakness Extremities:  Without edema, cyanosis or clubbing. Neurologic:  Alert and oriented x3;  grossly normal neurologically. Skin:  Intact without significant lesions or rashes. Psych:  Alert and cooperative. Normal affect.  LAB RESULTS:    Latest Ref Rng & Units 09/07/2023   10:33 PM 09/07/2023    7:53 PM 02/28/2023    4:48 AM  CBC  WBC 4.0 - 10.5 K/uL 14.7  14.8    Hemoglobin 12.0 - 15.0 g/dL 16.1  09.6  9.2   Hematocrit 36.0 - 46.0 % 32.4  34.1    Platelets 150 - 400 K/uL 230  236      BMET     Latest Ref Rng & Units 09/07/2023    7:53 PM 02/27/2023    5:46 AM 02/26/2023    8:38 PM  BMP  Glucose 70 - 99 mg/dL 045  90  409   BUN 8 - 23 mg/dL 41  25  29   Creatinine 0.44 - 1.00 mg/dL 8.11  9.14  7.82   Sodium 135 - 145 mmol/L 131  137  133   Potassium 3.5 - 5.1 mmol/L 4.4  4.3  3.9   Chloride 98 - 111 mmol/L 98  105  97   CO2 22 - 32 mmol/L 24  25  25    Calcium  8.9 - 10.3 mg/dL 8.9  7.9  8.7     LFT    Latest Ref Rng & Units 09/07/2023    7:53 PM 09/20/2020   12:17 PM 03/16/2020    2:29 PM  Hepatic Function  Total Protein 6.5 - 8.1 g/dL 7.0  6.9  6.1   Albumin  3.5 - 5.0 g/dL 3.2  3.3  2.9   AST 15 - 41 U/L 27  27  30    ALT 0 - 44 U/L 15  16  19    Alk Phosphatase 38 - 126 U/L 59  56  57   Total Bilirubin 0.0 - 1.2 mg/dL 0.9  0.7  1.2   Bilirubin, Direct 0.0 - 0.2 mg/dL 0.2        STUDIES: CT Soft Tissue Neck W Contrast Result Date: 09/08/2023 CLINICAL DATA:  Foreign body ingestion with possible damage to posterior oropharynx or anywhere along the esophagus. Vomiting entirely blood EXAM: CT NECK WITH CONTRAST TECHNIQUE: Multidetector CT imaging of the neck was performed using the standard protocol following the bolus administration of intravenous contrast. RADIATION DOSE REDUCTION: This exam was performed according to the departmental dose-optimization program which includes automated exposure control, adjustment of the mA and/or kV according to patient size and/or use of iterative  reconstruction technique. CONTRAST:  75mL OMNIPAQUE  IOHEXOL  300 MG/ML  SOLN COMPARISON:  None Available. FINDINGS: Pharynx and larynx: Normal. No mass or swelling. No radiopaque foreign body identified. Salivary glands: No inflammation, mass, or stone. Thyroid : Subcentimeter thyroid  nodules which do not require further imaging follow-up (ref: J Am Coll Radiol. 2015 Feb;12(2): 143-50). Lymph nodes: None enlarged or abnormal density. Vascular: Limited evaluation due to non arterial contrast bolus timing.  The major arteries appear grossly patent in the neck. Limited intracranial: Negative. Visualized orbits: Negative. Mastoids and visualized paranasal sinuses: Clear. Skeleton: Remote craniocervical fractures. Upper chest: Visualized lung apices are clear. Small right pleural effusion. Aortic atherosclerosis. IMPRESSION: 1. Distended fluid-filled esophagus with layering hyperdensity, suggestive of hemorrhage. A CTA could better assess for active extravasation if clinically warranted. 2. No radiopaque foreign body or significant edema identified. 3. Small right pleural effusion. Electronically Signed   By: Stevenson Elbe M.D.   On: 09/08/2023 00:48   CT CHEST W CONTRAST Result Date: 09/07/2023 CLINICAL DATA:  Foreign body ingestion with possible damage to posterior oropharynx or anywhere along the esophagus. Vomiting entirely blood EXAM: CT CHEST WITH CONTRAST TECHNIQUE: Multidetector CT imaging of the chest was performed during intravenous contrast administration. RADIATION DOSE REDUCTION: This exam was performed according to the departmental dose-optimization program which includes automated exposure control, adjustment of the mA and/or kV according to patient size and/or use of iterative reconstruction technique. CONTRAST:  75mL OMNIPAQUE  IOHEXOL  300 MG/ML  SOLN COMPARISON:  11/03/2017 FINDINGS: Cardiovascular: Heart is normal size. Small pericardial effusion. Diffuse coronary artery and aortic atherosclerosis. No aneurysm. Mediastinum/Nodes: No mediastinal, hilar, or axillary adenopathy. Trachea and thyroid  unremarkable. Moderate-sized hiatal hernia. Esophagus is fluid-filled. Focal area of high density posteriorly along the upper thoracic esophagus near the thoracic inlet. Cannot exclude active extravasation of contrast. Lungs/Pleura: Small bilateral pleural effusions. Bibasilar scarring. No additional confluent airspace opacity. Upper Abdomen: No acute findings Musculoskeletal: Chest wall soft tissues are  unremarkable. No acute bony abnormality. IMPRESSION: Distended, fluid-filled esophagus. Area of high density noted along the posterior wall in the upper thoracic esophagus near the thoracic inlet. Cannot exclude active extravasation of contrast. Small bilateral pleural effusions.  Bibasilar scarring. Coronary artery disease. Aortic Atherosclerosis (ICD10-I70.0). Electronically Signed   By: Janeece Mechanic M.D.   On: 09/07/2023 21:17   DG Chest Portable 1 View Result Date: 09/07/2023 CLINICAL DATA:  Vomiting blood EXAM: PORTABLE CHEST 1 VIEW COMPARISON:  None Available. FINDINGS: Heart and mediastinal contours within normal limits. Increased markings in the lung bases likely reflect atelectasis or scarring. No confluent opacities or effusions. No acute bony abnormality. IMPRESSION: Bibasilar atelectasis or scarring. Electronically Signed   By: Janeece Mechanic M.D.   On: 09/07/2023 21:14      Impression / Plan:   Cindy Graham is a 89 y.o. female with asthma/COPD, osteoporosis, hypertension presented with active hematemesis  Hematemesis, acute blood loss anemia Recommend urgent endoscopy evaluation under general anesthesia, endotracheal intubation to protect airway Recommend pantoprazole  drip or Protonix  40 mg IV twice daily Maintain strict n.p.o. Monitor CBC closely to maintain hemoglobin above 7 Maintain 2 large-bore Ivs Recommend ICU admission  I have discussed alternative options, risks & benefits,  which include, but are not limited to, bleeding, infection, perforation,respiratory complication & drug reaction.  The patient agrees with this plan & written consent will be obtained.      Thank you for involving me in the care of this patient.      LOS: 0 days   Ziggy Chanthavong  Baldomero Bone, MD  09/08/2023, 1:09 AM    Note: This dictation was prepared with Dragon dictation along with smaller phrase technology. Any transcriptional errors that result from this process are unintentional.

## 2023-09-08 NOTE — Progress Notes (Signed)
 0300: Pt arrived to unit from OR. Upon arrival, IV in right forearm noted to be infiltrated. LR infusion stopped and pharmacy notified. IV removed. Large hematoma on left forearm also noted. NP at the bedside to assess hematoma. Hematoma marked. IV team consulted d/t no IV access. IV team was able to successfully place an IV. See eMAR for new orders.

## 2023-09-08 NOTE — Progress Notes (Signed)
 eLink Physician-Brief Progress Note Patient Name: SHANESSA HODAK DOB: 1921/11/28 MRN: [id number]   Date of Service  09/08/2023  HPI/Events of Note  89 year old female with history of COPD, hypertension, dyslipidemia who initially presented to the hospital after spitting up bright red blood with clots and she felt that something got stuck earlier this morning when she had pancake and eggs.  In the setting of active hematemesis and acute blood loss anemia, she underwent urgent endoscopy and was admitted to the ICU for further maintenance.  Vital signs show that she is hypertensive but otherwise saturating 100% on 40% FiO2.  She is currently mechanically ventilated results are consistent with essentially normal metabolic panel mild leukocytosis associated with minimal drop in hemoglobin.  Endoscopy revealed 1 large cratered esophageal ulceration.  eICU Interventions  No evidence to support current PPI dosing-recommend scaling back to 40 mg IV twice daily.  Peripheral vasopressors as needed to maintain MAP greater than 65.  Hemoglobin goal greater than 7.  DVT prophylaxis with SCDs in the setting of active bleeding GI prophylaxis with therapeutic PPI     Intervention Category Evaluation Type: New Patient Evaluation  Opal Dinning 09/08/2023, 3:09 AM

## 2023-09-08 NOTE — ED Notes (Signed)
 To OR

## 2023-09-08 NOTE — Anesthesia Preprocedure Evaluation (Signed)
 Anesthesia Evaluation  Patient identified by MRN, date of birth, ID band Patient awake  General Assessment Comment:  Patient with frequent hematemesis. AO x 3 but very hard of hearing  Reviewed: Allergy & Precautions, NPO status , Patient's Chart, lab work & pertinent test results  History of Anesthesia Complications Negative for: history of anesthetic complications  Airway Mallampati: III  TM Distance: >3 FB Neck ROM: Full    Dental  (+) Edentulous Upper, Edentulous Lower   Pulmonary asthma , neg sleep apnea, COPD, Patient abstained from smoking.Not current smoker   Pulmonary exam normal breath sounds clear to auscultation       Cardiovascular Exercise Tolerance: Poor METShypertension, + Peripheral Vascular Disease and +CHF  (-) CAD and (-) Past MI (-) dysrhythmias  Rhythm:Regular Rate:Normal - Systolic murmurs TTE 2023: 1. LVH with brightly speckled myocardium. Consider an infiltrataive  process.   2. Left ventricular ejection fraction, by estimation, is 70 to 75%. The  left ventricle has hyperdynamic function. The left ventricle has no  regional wall motion abnormalities. There is mild concentric left  ventricular hypertrophy. Left ventricular  diastolic parameters are consistent with Grade II diastolic dysfunction  (pseudonormalization).   3. Right ventricular systolic function is mildly reduced. The right  ventricular size is mildly enlarged. Mildly increased right ventricular  wall thickness.   4. The mitral valve is normal in structure. Mild mitral valve  regurgitation.   5. The aortic valve is normal in structure. Aortic valve regurgitation is  mild.     Neuro/Psych  Headaches PSYCHIATRIC DISORDERS Anxiety Depression       GI/Hepatic ,GERD  ,,(+)     (-) substance abuse    Endo/Other  neg diabetes    Renal/GU CRFRenal disease     Musculoskeletal   Abdominal   Peds  Hematology  (+) Blood dyscrasia    Anesthesia Other Findings Past Medical History: No date: Anxiety No date: Arthritis     Comment:  osteo No date: Asthma No date: COPD (chronic obstructive pulmonary disease) (HCC) No date: GERD (gastroesophageal reflux disease) No date: Heart murmur No date: Hernia No date: Hyperlipidemia No date: Hypertension No date: Melanoma (HCC)     Comment:  face No date: Syncope and collapse No date: Urine incontinence  Reproductive/Obstetrics                             Anesthesia Physical Anesthesia Plan  ASA: 4  Anesthesia Plan: General   Post-op Pain Management: Minimal or no pain anticipated   Induction: Intravenous and Rapid sequence  PONV Risk Score and Plan: 3 and Propofol  infusion, TIVA, Ondansetron  and Treatment may vary due to age or medical condition  Airway Management Planned: Nasal Cannula, Oral ETT and Video Laryngoscope Planned  Additional Equipment: None  Intra-op Plan:   Post-operative Plan: Post-operative intubation/ventilation  Informed Consent: I have reviewed the patients History and Physical, chart, labs and discussed the procedure including the risks, benefits and alternatives for the proposed anesthesia with the patient or authorized representative who has indicated his/her understanding and acceptance.   Patient has DNR.  Discussed DNR with patient, Discussed DNR with power of attorney and Continue DNR.     Plan Discussed with: CRNA and Surgeon  Anesthesia Plan Comments: (Discussed risks of anesthesia with patient and daughter at bedside, including possibility of difficulty with spontaneous ventilation under anesthesia necessitating airway intervention, PONV, and rare risks such as cardiac or respiratory or neurological events,  and allergic reactions. Discussed with gastroenterologist Dr Baldomero Bone who believes keeping this patient intubated postop and sent to the ICU is the safest course. I told this to the patient and family and  they understand. I discussed the patient's DNR in detail. The patient wishes to keep her DNR perioperatively, and the daughter agrees that this is what the patient would want.  Discussed possibility of blood transfusion. Discussed the role of CRNA in patient's perioperative care. Patient understands.)       Anesthesia Quick Evaluation

## 2023-09-08 NOTE — IPAL (Signed)
  Interdisciplinary Goals of Care Family Meeting   Date carried out: 09/08/2023  Location of the meeting: Bedside  Member's involved: Physician, Nurse Practitioner, and Family Member or next of kin  Durable Power of Attorney or acting medical decision maker: Son Genella Kendall and Daughter  Newton Barer  Discussion: We discussed goals of care for [employer] . I clearly relayed to them that was she went through at this age is traumatic to her body however what is reassuring is her intact mental status, stable hemoglobin and stable blood pressure. We talked about what could be causing her underlying ulcer and per GI seemed like a severe case of erosive esophagitis however malignancy could not be completely excluded. We then discussed the next steps that include reverting to comfort care however given her stable H&H mental status and blood pressure it is more reasonable to proceed with extubation in the am after deemed safe to do so and if she should show any signs of recurrent bleeding and suffering then we will proceed with comfort care.  Code status:   Code Status: Limited: Do not attempt resuscitation (DNR) -DNR-LIMITED -Do Not Intubate/DNI    Disposition: Continue current acute care  Time spent for the meeting: 20 minutes    Annitta Kindler, MD  09/08/2023, 4:31 PM

## 2023-09-08 NOTE — Progress Notes (Signed)
 EGD postprocedure note  Large clot in the entire esophagus, used bleeder scope, was able to remove a very small portion of clot in piecemeal.  Gradually, was able to traverse the stomach next to the clot.  Stomach contained blood from esophagus and duodenum was normal.  Has small hiatal hernia.  While withdrawing the scope, there was evidence of extensive, very large cratered esophageal ulcer occupying lower esophagus with gigantic clot, stigmata of recent bleeding as well as oozing. Unfortunately very limited endoscopy interventions to control bleeding, injected epi and sprayed hemostatic powder. Looks like a bad case of severe erosive esophagitis. I could not appreciate any discrete esophageal mass.  Recommend to remain intubated for next 24 hours, ordered Reglan  for motility and monitor CBC closely, high-dose Protonix . Updated family  Patient is critically ill  Karma Oz, MD [facility], [facility] [address]  [address]  [place], [place] [address]  Main: [phone]  Fax: [phone] Pager: [phone]

## 2023-09-08 NOTE — Consult Note (Signed)
 PHARMACY CONSULT NOTE - ELECTROLYTES  Pharmacy Consult for Electrolyte Monitoring and Replacement   Recent Labs: Potassium (mmol/L)  Date Value  09/08/2023 4.1  06/07/2013 3.8   Magnesium  (mg/dL)  Date Value  16/02/9603 1.7   Calcium  (mg/dL)  Date Value  54/01/8118 8.5 (L)   Calcium , Total (mg/dL)  Date Value  14/78/2956 9.1   Albumin  (g/dL)  Date Value  21/30/8657 3.2 (L)  06/01/2013 3.3 (L)   Phosphorus (mg/dL)  Date Value  84/69/6295 3.4   Sodium (mmol/L)  Date Value  09/08/2023 131 (L)  06/13/2014 138  06/07/2013 141   Height: 5\' 3"  (160 cm) Weight: 60.8 kg (134 lb) IBW/kg (Calculated) : 52.4 Estimated Creatinine Clearance: 30.9 mL/min (by C-G formula based on SCr of 0.76 mg/dL).  Assessment  Cindy Graham is a 89 y.o. female presenting with GI bleeding secondary to very large cratered esophageal ulcer. PMH significant for asthma, COPD, HLD, HTN, melanoma, GERD, anxiety, arthritis. Pharmacy has been consulted to monitor and replace electrolytes.  Diet: NPO MIVF: N/A Pertinent medications: N/A  Goal of Therapy: Electrolytes within normal limits  Plan:  Mg 1.7, magnesium  sulfate 2 g IV x 1 Follow-up electrolytes tomorrow AM  Thank you for allowing pharmacy to be a part of this patient's care.  Glendell Fouse B Telly Jawad 09/08/2023 7:35 AM

## 2023-09-08 NOTE — Transfer of Care (Signed)
 Immediate Anesthesia Transfer of Care Note  Patient: Cindy Graham  Procedure(s) Performed: EGD (ESOPHAGOGASTRODUODENOSCOPY) INJECTION, SUBMUCOSAL  Patient Location: ICU  Anesthesia Type:General  Level of Consciousness: sedated  Airway & Oxygen  Therapy: Patient remains intubated per anesthesia plan  Post-op Assessment: Report given to RN and Post -op Vital signs reviewed and stable  Post vital signs: Reviewed and stable  Last Vitals:  Vitals Value Taken Time  BP 141/57 09/08/23 0246  Temp    Pulse 89 09/08/23 0248  Resp 15 09/08/23 0248  SpO2 100 % 09/08/23 0248  Vitals shown include unfiled device data.  Last Pain:  Vitals:   09/08/23 0000  TempSrc: Oral  PainSc:          Complications: No notable events documented.

## 2023-09-08 NOTE — TOC Initial Note (Signed)
 Transition of Care [facility]) - Initial/Assessment Note    Patient Details  Name: Cindy Graham MRN: [id number] Date of Birth: 08-23-1921  Transition of Care [facility]) CM/SW Contact:    Randell Bussing, LCSW Phone Number: 09/08/2023, 12:31 PM  Clinical Narrative:                 Patient is from [facility]. CSW spoke with Abraham Hoffmann at [facility] - she states in order for patient to return they would need to assess in person once patient is medically stable. The soonest they could do this would be Tuesday due to a holiday weekend.  Expected Discharge Plan: Assisted Living Barriers to Discharge: Barriers Unresolved (comment)   Patient Goals and CMS Choice            Expected Discharge Plan and Services                                              Prior Living Arrangements/Services                       Activities of Daily Living      Permission Sought/Granted                  Emotional Assessment              Admission diagnosis:  GIB (gastrointestinal bleeding) [K92.2] Hematemesis with nausea [K92.0] Patient Active Problem List   Diagnosis Date Noted   Acute upper GI bleeding 09/08/2023   Hematemesis with nausea 09/08/2023   Multiple skin tears 02/28/2023   Fall 02/26/2023   Hematoma of left buttock 02/26/2023   Essential hypertension 02/26/2023   Chronic obstructive pulmonary disease (COPD) (HCC) 02/26/2023   Lumbar radiculopathy 02/26/2023   Pressure injury of skin 11/03/2021   Obesity (BMI 30-39.9) 11/03/2021   Acute on chronic diastolic CHF (congestive heart failure) (HCC) 11/03/2021   Cellulitis of right lower extremity 11/02/2021   Acute cystitis with hematuria 11/02/2021   Anxiety 11/02/2021   GERD (gastroesophageal reflux disease) 11/02/2021   Neuropathy 11/02/2021   Hypokalemia 11/02/2021   Hypomagnesemia 11/02/2021   CKD (chronic kidney disease), stage III (HCC) 11/02/2021   Septic shock (HCC) 11/01/2021    Tachycardia    Sepsis (HCC) 03/17/2020   Severe sepsis (HCC) 03/16/2020   COPD (chronic obstructive pulmonary disease) (HCC)    Elevated troponin    Displaced intertrochanteric fracture of right femur, initial encounter for closed fracture (HCC)    Acute kidney injury (AKI) with acute tubular necrosis (ATN) (HCC) 02/27/2019   Stroke (cerebrum) (HCC) 02/22/2019   Cellulitis 06/12/2018   Hyponatremia 11/24/2017   Headache 11/24/2017   Wound of left leg 11/01/2017   Constipation 09/05/2017   Other fatigue 12/22/2016   Tremor 03/29/2016   Amaurosis fugax 12/25/2015   Asthma 09/17/2015   PVD (peripheral vascular disease) (HCC) 05/28/2015   B12 deficiency 08/29/2014   Chronic back pain greater than 3 months duration 04/23/2013   Acute renal failure superimposed on stage 3a chronic kidney disease (HCC) 04/12/2012   Hyperlipidemia with target low density lipoprotein (LDL) cholesterol less than 100 mg/dL 46/96/2952   UTI (urinary tract infection) 07/26/2011   Osteoarthritis 07/06/2011   Hypertension 02/21/2011   Depression/Anxiety 02/21/2011   PCP:  Cole Daubs, [facility]:   Andree Kayser [place], [place] - [phone]  [address] [place]. [address] [place] [address] Phone: [phone] Fax: [phone]     Social Drivers of Health (SDOH) Social History: SDOH Screenings   Food Insecurity: Patient Unable To Answer (09/08/2023)  Housing: Patient Unable To Answer (09/08/2023)  Transportation Needs: Patient Unable To Answer (09/08/2023)  Utilities: Patient Unable To Answer (09/08/2023)  Social Connections: Patient Unable To Answer (09/08/2023)  Tobacco Use: Low Risk  (09/08/2023)   SDOH Interventions:     Readmission Risk Interventions     No data to display

## 2023-09-08 NOTE — Anesthesia Postprocedure Evaluation (Signed)
 Anesthesia Post Note  Patient: Cindy Graham  Procedure(s) Performed: EGD (ESOPHAGOGASTRODUODENOSCOPY) INJECTION, SUBMUCOSAL  Patient location during evaluation: ICU Anesthesia Type: General Level of consciousness: sedated Pain management: pain level controlled Vital Signs Assessment: post-procedure vital signs reviewed and stable Respiratory status: spontaneous breathing, nonlabored ventilation, respiratory function stable and patient connected to nasal cannula oxygen  Cardiovascular status: blood pressure returned to baseline and stable Postop Assessment: no apparent nausea or vomiting Anesthetic complications: no   No notable events documented.   Last Vitals:  Vitals:   09/08/23 0645 09/08/23 0700  BP: (!) 138/54 (!) 166/56  Pulse: 81 86  Resp: 19 18  Temp:    SpO2: 100% 100%    Last Pain:  Vitals:   09/08/23 0000  TempSrc: Oral  PainSc:                  Sabas Cradle

## 2023-09-08 NOTE — Op Note (Signed)
 [facility] Patient Name: Cindy Graham Procedure Date: 09/08/2023 12:43 AM MRN: [id number] Account #: [id number] Date of Birth: [birth year] Admit Type: Inpatient Age: 89 Room: OR 9 Gender: Female Note Status: Finalized Instrument Name: Bleederscope [id number] Procedure:             Upper GI endoscopy Indications:           Hematemesis Providers:             Selena Daily MD, MD Referring MD:          No Local Md, MD (Referring MD) Medicines:             General Anesthesia Complications:         No immediate complications. Estimated blood loss:                         Minimal. Procedure:             Pre-Anesthesia Assessment:                        - Prior to the procedure, a History and Physical was                         performed, and patient medications and allergies were                         reviewed. The patient is unable to give consent                         secondary to the patient being legally incompetent to                         consent. The risks and benefits of the procedure and                         the sedation options and risks were discussed with the                         patient's daughter. All questions were answered and                         informed consent was obtained. Patient identification                         and proposed procedure were verified by the physician,                         the nurse, the anesthesiologist, the anesthetist and                         the technician in the pre-procedure area in the                         procedure room in the endoscopy suite. Mental Status                         Examination: alert and oriented. Airway Examination:  normal oropharyngeal airway and neck mobility.                         Respiratory Examination: clear to auscultation. CV                         Examination: normal. Prophylactic Antibiotics: The                          patient does not require prophylactic antibiotics.                         Prior Anticoagulants: The patient has taken no                         anticoagulant or antiplatelet agents. ASA Grade                         Assessment: IV - A patient with severe systemic                         disease that is a constant threat to life. After                         reviewing the risks and benefits, the patient was                         deemed in satisfactory condition to undergo the                         procedure. The anesthesia plan was to use general                         anesthesia. Immediately prior to administration of                         medications, the patient was re-assessed for adequacy                         to receive sedatives. The heart rate, respiratory                         rate, oxygen  saturations, blood pressure, adequacy of                         pulmonary ventilation, and response to care were                         monitored throughout the procedure. The physical                         status of the patient was re-assessed after the                         procedure.                        After obtaining informed consent, the endoscope was  passed under direct vision. Throughout the procedure,                         the patient's blood pressure, pulse, and oxygen                          saturations were monitored continuously. The Endoscope                         was introduced through the mouth, and advanced to the                         second part of duodenum. The upper GI endoscopy was                         accomplished without difficulty. The patient tolerated                         the procedure well. Findings:      The duodenal bulb and second portion of the duodenum were normal.      A small hiatal hernia was present.      The entire examined stomach was normal.      One cratered very large esophageal ulcer oozing  blood and enormous clot       in entire esophagus was found 20 to 30 cm from the incisors. Margins of       the ulcer area was injected with 6 mL of a 0.1 mg/mL solution of       epinephrine  for hemostasis. To stop active bleeding, hemostatic spray       was deployed. Several sprays were applied. There was no bleeding at the       end of the procedure. Impression:            - Normal duodenal bulb and second portion of the                         duodenum.                        - Small hiatal hernia.                        - Normal stomach.                        - Esophageal ulcer oozing blood. Injected. hemostatic                         spray applied.                        - No specimens collected. Recommendation:        - Return patient to ICU for ongoing care.                        - NPO.                        - Give Protonix  (pantoprazole ): initiate therapy with  80 mg IV bolus, then 8 mg/hr IV by continuous infusion. Procedure Code(s):     --- Professional ---                        [phone], Esophagogastroduodenoscopy, flexible,                         transoral; with control of bleeding, any method Diagnosis Code(s):     --- Professional ---                        K44.9, Diaphragmatic hernia without obstruction or                         gangrene                        K22.11, Ulcer of esophagus with bleeding                        K92.0, Hematemesis CPT copyright 2022 American Medical Association. All rights reserved. The codes documented in this report are preliminary and upon coder review may  be revised to meet current compliance requirements. Dr. Evia Hof Selena Daily MD, MD 09/08/2023 2:32:28 AM This report has been signed electronically. Number of Addenda: 0 Note Initiated On: 09/08/2023 12:43 AM Estimated Blood Loss:  Estimated blood loss was minimal.      [facility]

## 2023-09-09 DIAGNOSIS — K920 Hematemesis: Secondary | ICD-10-CM | POA: Diagnosis not present

## 2023-09-09 DIAGNOSIS — R0789 Other chest pain: Secondary | ICD-10-CM | POA: Diagnosis not present

## 2023-09-09 DIAGNOSIS — K922 Gastrointestinal hemorrhage, unspecified: Secondary | ICD-10-CM | POA: Diagnosis not present

## 2023-09-09 DIAGNOSIS — Z515 Encounter for palliative care: Secondary | ICD-10-CM | POA: Diagnosis not present

## 2023-09-09 DIAGNOSIS — D72829 Elevated white blood cell count, unspecified: Secondary | ICD-10-CM | POA: Diagnosis not present

## 2023-09-09 LAB — RENAL FUNCTION PANEL
Albumin: 2.5 g/dL — ABNORMAL LOW (ref 3.5–5.0)
Anion gap: 8 (ref 5–15)
BUN: 38 mg/dL — ABNORMAL HIGH (ref 8–23)
CO2: 27 mmol/L (ref 22–32)
Calcium: 8.4 mg/dL — ABNORMAL LOW (ref 8.9–10.3)
Chloride: 103 mmol/L (ref 98–111)
Creatinine, Ser: 0.87 mg/dL (ref 0.44–1.00)
GFR, Estimated: 59 mL/min — ABNORMAL LOW (ref >60)
Glucose, Bld: 103 mg/dL — ABNORMAL HIGH (ref 70–99)
Phosphorus: 3.4 mg/dL (ref 2.5–4.6)
Potassium: 3.8 mmol/L (ref 3.5–5.1)
Sodium: 135 mmol/L (ref 135–145)

## 2023-09-09 LAB — CBC
HCT: 24.5 % — ABNORMAL LOW (ref 36.0–46.0)
Hemoglobin: 8.1 g/dL — ABNORMAL LOW (ref 12.0–15.0)
MCH: 30.9 pg (ref 26.0–34.0)
MCHC: 33.1 g/dL (ref 30.0–36.0)
MCV: 93.5 fL (ref 80.0–100.0)
Platelets: 194 10*3/uL (ref 150–400)
RBC: 2.62 MIL/uL — ABNORMAL LOW (ref 3.87–5.11)
RDW: 15.7 % — ABNORMAL HIGH (ref 11.5–15.5)
WBC: 11.5 10*3/uL — ABNORMAL HIGH (ref 4.0–10.5)
nRBC: 0 % (ref 0.0–0.2)

## 2023-09-09 LAB — GLUCOSE, CAPILLARY
Glucose-Capillary: 101 mg/dL — ABNORMAL HIGH (ref 70–99)
Glucose-Capillary: 72 mg/dL (ref 70–99)
Glucose-Capillary: 84 mg/dL (ref 70–99)
Glucose-Capillary: 90 mg/dL (ref 70–99)
Glucose-Capillary: 91 mg/dL (ref 70–99)
Glucose-Capillary: 95 mg/dL (ref 70–99)
Glucose-Capillary: 99 mg/dL (ref 70–99)

## 2023-09-09 LAB — HEMOGLOBIN AND HEMATOCRIT, BLOOD
HCT: 24.1 % — ABNORMAL LOW (ref 36.0–46.0)
Hemoglobin: 7.9 g/dL — ABNORMAL LOW (ref 12.0–15.0)

## 2023-09-09 LAB — TRIGLYCERIDES: Triglycerides: 96 mg/dL (ref <150)

## 2023-09-09 MED ORDER — ORAL CARE MOUTH RINSE: 15.0000 mL | OROMUCOSAL | Status: DC | PRN

## 2023-09-09 NOTE — Progress Notes (Signed)
 Inpatient Follow-up/Progress Note   Patient ID: Cindy Graham is a 89 y.o. female.  Overnight Events / Subjective Findings Pt extubated this morning. She denies any abdominal pain No further signs of gib Hgb down 2 grams, but global drop in cell lines. Vital signs have remained stable  Family (3) at bedside upon my second visit to room  Review of Systems  HENT:  Negative for trouble swallowing.   Respiratory:  Negative for shortness of breath.   Cardiovascular:  Negative for chest pain.  Gastrointestinal:  Negative for abdominal pain, blood in stool, nausea and vomiting.     Medications  Current Facility-Administered Medications:    cefTRIAXone  (ROCEPHIN ) 1 g in sodium chloride  0.9 % 100 mL IVPB, 1 g, Intravenous, Q24H, Assaker, Jean-Pierre, MD, Stopped at 09/08/23 1129   Chlorhexidine  Gluconate Cloth 2 % PADS 6 each, 6 each, Topical, Daily, Assaker, Jean-Pierre, MD, 6 each at 09/08/23 2106   docusate sodium  (COLACE) capsule 100 mg, 100 mg, Oral, BID PRN, Rust-Chester, Britton L, NP   fentaNYL  (SUBLIMAZE ) injection 25 mcg, 25 mcg, Intravenous, Q15 min PRN, Rust-Chester, Jenni Mody L, NP, 25 mcg at 09/08/23 1159   fentaNYL  (SUBLIMAZE ) injection 25-50 mcg, 25-50 mcg, Intravenous, Q1H PRN, Rust-Chester, Britton L, NP, 50 mcg at 09/08/23 0451   ondansetron  (ZOFRAN ) injection 4 mg, 4 mg, Intravenous, Q6H PRN, Rust-Chester, Gara July, NP   Oral care mouth rinse, 15 mL, Mouth Rinse, Q2H, Assaker, Jean-Pierre, MD, 15 mL at 09/09/23 0553   Oral care mouth rinse, 15 mL, Mouth Rinse, PRN, Assaker, Marianne Shirts, MD   [COMPLETED] pantoprazole  (PROTONIX ) injection 40 mg, 40 mg, Intravenous, Q5 min, 40 mg at 09/08/23 0407 **FOLLOWED BY** pantoprazole  (PROTONIX ) injection 40 mg, 40 mg, Intravenous, Q8H, 40 mg at 09/09/23 0253 **FOLLOWED BY** [START ON 09/11/2023] pantoprazole  (PROTONIX ) injection 40 mg, 40 mg, Intravenous, Q12H, Vanga, Rohini Reddy, MD   polyethylene glycol (MIRALAX  / GLYCOLAX ) packet  17 g, 17 g, Oral, Daily PRN, Rust-Chester, Britton L, NP   propofol  (DIPRIVAN ) 1000 MG/100ML infusion, 0-50 mcg/kg/min, Intravenous, Continuous, Rust-Chester, Britton L, NP, Last Rate: 3.65 mL/hr at 09/09/23 0700, 10 mcg/kg/min at 09/09/23 0700  cefTRIAXone  (ROCEPHIN )  IV Stopped (09/08/23 1129)   propofol  (DIPRIVAN ) infusion 10 mcg/kg/min (09/09/23 0700)    docusate sodium , fentaNYL  (SUBLIMAZE ) injection, fentaNYL  (SUBLIMAZE ) injection, ondansetron  (ZOFRAN ) IV, mouth rinse, polyethylene glycol   Objective    Vitals:   09/09/23 0600 09/09/23 0630 09/09/23 0700 09/09/23 0730  BP: (!) 120/51 (!) 135/50 (!) 128/49 (!) 140/69  Pulse: 87 90 89 93  Resp: 20 17 19 17   Temp:      TempSrc:      SpO2: 99% 100% 100% 100%  Weight:      Height:         Physical Exam Vitals and nursing note reviewed.  Constitutional:      General: She is not in acute distress.    Appearance: She is ill-appearing. She is not toxic-appearing or diaphoretic.     Comments: Frail-appearing, but normal for age  HENT:     Head: Normocephalic and atraumatic.     Ears:     Comments: HOH    Nose: Nose normal.     Mouth/Throat:     Mouth: Mucous membranes are moist.     Pharynx: Oropharynx is clear.  Eyes:     General: No scleral icterus.    Extraocular Movements: Extraocular movements intact.  Cardiovascular:     Rate and Rhythm: Normal rate and regular  rhythm.     Heart sounds: Normal heart sounds. No murmur heard.    No friction rub. No gallop.  Pulmonary:     Effort: Pulmonary effort is normal. No respiratory distress.     Breath sounds: Normal breath sounds. No wheezing, rhonchi or rales.  Abdominal:     General: Bowel sounds are normal. There is no distension.     Palpations: Abdomen is soft.     Tenderness: There is no abdominal tenderness. There is no guarding or rebound.  Musculoskeletal:     Cervical back: Neck supple.     Right lower leg: No edema.     Left lower leg: No edema.  Skin:     General: Skin is warm and dry.     Coloration: Skin is pale. Skin is not jaundiced.     Comments: Scattered ecchymosis   Neurological:     General: No focal deficit present.     Mental Status: She is alert and oriented to person, place, and time. Mental status is at baseline.  Psychiatric:        Mood and Affect: Mood normal.        Behavior: Behavior normal.        Thought Content: Thought content normal.        Judgment: Judgment normal.      Laboratory Data Recent Labs  Lab 09/07/23 1953 09/07/23 2233 09/08/23 0422 09/08/23 0811 09/09/23 0407  WBC 14.8* 14.7* 13.0* 14.7* 11.5*  HGB 11.1* 10.4* 9.0* 10.3* 8.1*  HCT 34.1* 32.4* 27.7* 31.5* 24.5*  PLT 236 230 231 256 194  NEUTOPHILPCT 85 85  --   --   --   LYMPHOPCT 10 10  --   --   --   MONOPCT 5 5  --   --   --   EOSPCT 0 0  --   --   --    Recent Labs  Lab 09/07/23 1953 09/08/23 0422 09/09/23 0407  NA 131* 131* 135  K 4.4 4.1 3.8  CL 98 98 103  CO2 24 25 27   BUN 41* 48* 38*  CREATININE 0.73 0.76 0.87  CALCIUM  8.9 8.5* 8.4*  PROT 7.0  --   --   BILITOT 0.9  --   --   ALKPHOS 59  --   --   ALT 15  --   --   AST 27  --   --   GLUCOSE 163* 156* 103*   Recent Labs  Lab 09/07/23 2023  INR 1.1      Imaging Studies: DG Chest Port 1 View Result Date: 09/08/2023 CLINICAL DATA:  Status post intubation EXAM: PORTABLE CHEST 1 VIEW COMPARISON:  Film from the previous day. FINDINGS: Endotracheal tube is noted just above the aortic arch 5.6 cm from the carina. Cardiac shadow is within normal limits. Lungs are clear. No bony abnormality is seen. IMPRESSION: Endotracheal tube in satisfactory position. Electronically Signed   By: Violeta Grey M.D.   On: 09/08/2023 03:01   CT Soft Tissue Neck W Contrast Result Date: 09/08/2023 CLINICAL DATA:  Foreign body ingestion with possible damage to posterior oropharynx or anywhere along the esophagus. Vomiting entirely blood EXAM: CT NECK WITH CONTRAST TECHNIQUE: Multidetector CT  imaging of the neck was performed using the standard protocol following the bolus administration of intravenous contrast. RADIATION DOSE REDUCTION: This exam was performed according to the departmental dose-optimization program which includes automated exposure control, adjustment of the mA and/or kV according to  patient size and/or use of iterative reconstruction technique. CONTRAST:  75mL OMNIPAQUE  IOHEXOL  300 MG/ML  SOLN COMPARISON:  None Available. FINDINGS: Pharynx and larynx: Normal. No mass or swelling. No radiopaque foreign body identified. Salivary glands: No inflammation, mass, or stone. Thyroid : Subcentimeter thyroid  nodules which do not require further imaging follow-up (ref: J Am Coll Radiol. 2015 Feb;12(2): 143-50). Lymph nodes: None enlarged or abnormal density. Vascular: Limited evaluation due to non arterial contrast bolus timing. The major arteries appear grossly patent in the neck. Limited intracranial: Negative. Visualized orbits: Negative. Mastoids and visualized paranasal sinuses: Clear. Skeleton: Remote craniocervical fractures. Upper chest: Visualized lung apices are clear. Small right pleural effusion. Aortic atherosclerosis. IMPRESSION: 1. Distended fluid-filled esophagus with layering hyperdensity, suggestive of hemorrhage. A CTA could better assess for active extravasation if clinically warranted. 2. No radiopaque foreign body or significant edema identified. 3. Small right pleural effusion. Electronically Signed   By: Stevenson Elbe M.D.   On: 09/08/2023 00:48   CT CHEST W CONTRAST Result Date: 09/07/2023 CLINICAL DATA:  Foreign body ingestion with possible damage to posterior oropharynx or anywhere along the esophagus. Vomiting entirely blood EXAM: CT CHEST WITH CONTRAST TECHNIQUE: Multidetector CT imaging of the chest was performed during intravenous contrast administration. RADIATION DOSE REDUCTION: This exam was performed according to the departmental dose-optimization program  which includes automated exposure control, adjustment of the mA and/or kV according to patient size and/or use of iterative reconstruction technique. CONTRAST:  75mL OMNIPAQUE  IOHEXOL  300 MG/ML  SOLN COMPARISON:  11/03/2017 FINDINGS: Cardiovascular: Heart is normal size. Small pericardial effusion. Diffuse coronary artery and aortic atherosclerosis. No aneurysm. Mediastinum/Nodes: No mediastinal, hilar, or axillary adenopathy. Trachea and thyroid  unremarkable. Moderate-sized hiatal hernia. Esophagus is fluid-filled. Focal area of high density posteriorly along the upper thoracic esophagus near the thoracic inlet. Cannot exclude active extravasation of contrast. Lungs/Pleura: Small bilateral pleural effusions. Bibasilar scarring. No additional confluent airspace opacity. Upper Abdomen: No acute findings Musculoskeletal: Chest wall soft tissues are unremarkable. No acute bony abnormality. IMPRESSION: Distended, fluid-filled esophagus. Area of high density noted along the posterior wall in the upper thoracic esophagus near the thoracic inlet. Cannot exclude active extravasation of contrast. Small bilateral pleural effusions.  Bibasilar scarring. Coronary artery disease. Aortic Atherosclerosis (ICD10-I70.0). Electronically Signed   By: Janeece Mechanic M.D.   On: 09/07/2023 21:17   DG Chest Portable 1 View Result Date: 09/07/2023 CLINICAL DATA:  Vomiting blood EXAM: PORTABLE CHEST 1 VIEW COMPARISON:  None Available. FINDINGS: Heart and mediastinal contours within normal limits. Increased markings in the lung bases likely reflect atelectasis or scarring. No confluent opacities or effusions. No acute bony abnormality. IMPRESSION: Bibasilar atelectasis or scarring. Electronically Signed   By: Janeece Mechanic M.D.   On: 09/07/2023 21:14    Assessment:   # Severe, large cratered Esophageal Ulcer with clot in lower esophagus - section of clot was removed on EGD on 4/18.  - This was treated with epi and hemospray  - no  discrete mass, although visualization was limited  # gerd # hyponatremia # COPD, HTN, HLD  Plan:   Continued 40 mg IV protonix  twice daily NPO post extubation Can consider clear liquids this evening Pt is at high risk for rebleed Given age and limited repeat modalities for treatment, no plan for repeat upper endoscopy at this time If this was an esophageal malignancy (as biopsies were not performed given significant bleeding), pt is off the age where treatment would not be an option, so repeating for biopsies would not  be useful at this time Global drop in cell lines, including hgb, but vital signs have remained stable and without signs of GIB (no melena/hematochezia)  Hold dvt ppx Monitor H&H.  Transfusion and resuscitation as per primary team Avoid frequent lab draws to prevent lab induced anemia Supportive care and antiemetics as per primary team Maintain two sites IV access Avoid nsaids Monitor for GIB.  Discussed case with PCCM and family at bedside  Management of other medical comorbidities as per primary team  I personally performed the service.  Thank you for allowing us  to participate in this patient's care. Please don't hesitate to call if any questions or concerns arise.   Quintin Buckle, DO [facility]  Portions of the record may have been created with voice recognition software. Occasional wrong-word or 'sound-a-like' substitutions may have occurred due to the inherent limitations of voice recognition software.  Read the chart carefully and recognize, using context, where substitutions may have occurred.

## 2023-09-09 NOTE — Consult Note (Signed)
 Consultation Note Date: 09/09/2023   Patient Name: Cindy Graham  DOB: [birth year]  MRN: [id number]  Age / Sex: 89 y.o., female  PCP: [facility] Referring Physician: Annitta Kindler, MD  Reason for Consultation: Establishing goals of care   HPI/Brief Hospital Course: 89 y.o. female  with past medical history of asthma, hyperlipidemia, hypertension, GERD, melanoma, anxiety and arthritis admitted from [place] [facility] on 09/07/2023 with multiple episodes of large hematemesis requiring intubation for airway protection.  S/p EGD 4/18 revealing very large cratered esophageal ulcer with gigantic clot treated during EGD and placed on high-dose IV Protonix  Remained intubated postprocedure  Palliative medicine was consulted for assisting with goals of care conversations.  Subjective:  Extensive chart review has been completed prior to meeting patient including labs, vital signs, imaging, progress notes, orders, and available advanced directive documents from current and previous encounters.  Spoke with CCM team earlier this a.m.  Ms. Schoppe able to be successfully extubated to nasal cannula.  CCM team spoke with son via telephone.  Goals of care conversations had and remain in the event of rebleed or decompensation Ms. Petrizzo will remain DNR/DNI and will then be transition to comfort care.  Returned to bedside to visit with son, daughter, and daughter-in-law.  Ms. Larabee awake, alert and able to engage in conversation but conversations complicated by her significant loss of hearing.  Family assists with relaying conversations to Ms. Ibrahim.  She reports feeling well this morning without acute complaints.  Introduced myself as a Publishing rights manager as a member of the palliative care team. Explained palliative medicine is specialized medical care for people living with serious illness. It focuses on providing relief from the symptoms and stress of a  serious illness. The goal is to improve quality of life for both the patient and the family.   During our conversations GI provider joined in at bedside.  He reviewed recommendations and plan of care moving forward.  Reviewed in detail GI conversations with patient and family and addressed their questions and concerns. Lengthy conversation had regarding stay in ICU, likely next step moving to a regular room and process of rehab assessment and placement.  Son and daughter share Ms. Ellerbrock has been a resident at [facility] for about 4 years.  They are pleased with the care that she has received there but feels she may be in need of short-term rehab at discharge to get her back to a place where she is safe for an assisted living facility.  Family shares just a few weeks ago they were able to celebrate Ms. Stephania Eglin 89-year birthday with many family and friends.  They were shocked by this acute event and how fast she decompensated.  Son and daughter share and confirm goals of care moving forward as discussed with CCM team.  We discussed taking this day by day with recommendations for outpatient palliative to follow at discharge to assist with ongoing goals of care and assessment of needs.  Family also discusses possible need for hospice type care in the event she develops recurrence of bleeding.  I discussed importance of continued conversations with family/support persons and all members of their medical team regarding overall plan of care and treatment options ensuring decisions are in alignment with patients goals of care.  All questions/concerns addressed. Emotional support provided to patient/family/support persons. PMT will continue to follow and support patient as needed.  Objective: Primary Diagnoses: Present on Admission:  Acute upper GI bleeding   Physical Exam Constitutional:  General: She is not in acute distress. Pulmonary:     Effort: Pulmonary effort is normal. No  respiratory distress.  Skin:    General: Skin is warm and dry.  Neurological:     Mental Status: She is alert.     Motor: Weakness present.     Vital Signs: BP (!) 136/42   Pulse 85   Temp 98.5 F (36.9 C) (Axillary)   Resp (!) 23   Ht 5\' 3"  (1.6 m)   Wt 60.8 kg   SpO2 98%   BMI 23.74 kg/m  Pain Scale: 0-10   Pain Score: 0-No pain (shakes head no to pain)  IO: Intake/output summary:  Intake/Output Summary (Last 24 hours) at 09/09/2023 1718 Last data filed at 09/09/2023 1451 Gross per 24 hour  Intake 205.33 ml  Output 1150 ml  Net -944.67 ml    LBM: Last BM Date :  (PTA) Baseline Weight: Weight: 60.8 kg Most recent weight: Weight: 60.8 kg      Assessment and Plan  SUMMARY OF RECOMMENDATIONS   Time for outcomes PMT to continue to follow for ongoing needs and support  Palliative Prophylaxis:   Bowel Regimen, Delirium Protocol and Frequent Pain Assessment  Discussed With:CCM team and nursing staff   Thank you for this consult and allowing Palliative Medicine to participate in the care of Kashari P. Gagliardo. Palliative medicine will continue to follow and assist as needed.   Time Total: 75 minutes  Time spent includes: Detailed review of medical records (labs, imaging, vital signs), medically appropriate exam (mental status, respiratory, cardiac, skin), discussed with treatment team, counseling and educating patient, family and staff, documenting clinical information, medication management and coordination of care.   Signed by: Isadore Marble, DNP, AGNP-C Palliative Medicine    Please contact Palliative Medicine Team phone at [phone] for questions and concerns.  For individual provider: See Tilford Foley

## 2023-09-09 NOTE — Progress Notes (Signed)
 NAME:  Cindy Graham, MRN:  [id number], DOB:  Jul 22, 1921, LOS: 1 ADMISSION DATE:  09/07/2023, CONSULTATION DATE:  09/08/23 REFERRING MD:  Dr. Karlynn Oyster, CHIEF COMPLAINT: Hematemesis    Brief Pt Description / Synopsis:  89 year old female, DNR/DNI,  presenting to [facility] with multiple episodes of large hematemesis requiring intubation for airway protection. She is status post EGD which revealed very large cratered esophageal ulcer occupying lower esophagus with gigantic clot found. She remained intubated postprocedure, now extubated 09/09/23.  History of Present Illness:  89 yo F presenting to [facility] ED from [facility] via EMS for evaluation of hematemesis.  History obtained per chart review and patient/ family bedside report prior to procedure. Patient reported being in her normal state of health until breakfast on 4/17 after eating a waffle and eggs at which time she felt as though she had scratched her throat. Since that time she had been tolerating liquids but had not tried any solids. An hour prior to arriving in the ED, around 18:00 the patient began vomiting blood. She also endorsed squeezing 4/10 lower medial chest discomfort. She denies blood thinners, denied nausea/ abdominal pain/ diarrhea, denied dyspnea, fever/ chills, blurred vision, headache, dizziness, LOC or fall.  ED course: Upon arrival patient alert and responsive, hypertensive urgency with SBP > 200 but otherwise stable vitals on RA. Medications given: IV contrast, Zofran  & protonix  Initial Vitals: 98.2, 18, 80, 214/72 & 99% on RA Significant labs: (Labs/ Imaging personally reviewed) I, Recardo Canal Rust-Chester, AGACNP-BC, personally viewed and interpreted this ECG. EKG Interpretation: pending Chemistry: Na+: 131, K+: 4.4, BUN/Cr.: 41/ 0.73, Serum CO2/ AG: 24/ 9 Hematology: WBC: 14.8, Hgb: 11.1 > 10.9,  Troponin: pending, Lactic: pending  ABG: pending CXR 09/07/23: bibasilar atelectasis or scarring CT soft tissue neck w  contrast 09/07/23: Distended fluid-filled esophagus with layering hyperdensity, suggestive of hemorrhage. A CTA could better assess for active extravasation if clinically warranted. No radiopaque foreign body or significant edema identified. Small right pleural effusion. CT chest w contrast 09/07/23: Distended, fluid-filled esophagus. Area of high density noted along the posterior wall in the upper thoracic esophagus near the thoracic inlet. Cannot exclude active extravasation of contrast. Small bilateral pleural effusions.  Bibasilar scarring. Coronary artery disease. Aortic Atherosclerosis  PCCM consulted for admission due to acute upper gastrointestinal bleeding s/p emergent endoscopy requiring post-operative mechanical ventilatory support.  Please see "Significant Hospital Events" section below for full detailed hospital course.    Pertinent  Medical History  Asthma & COPD HLD HTN Melanoma GERD Anxiety Arthritis  Micro Data:  4/18: MRSA PCR>> negative  Antimicrobials:   Anti-infectives (From admission, onward)    Start     Dose/Rate Route Frequency Ordered Stop   09/08/23 1045  cefTRIAXone  (ROCEPHIN ) 1 g in sodium chloride  0.9 % 100 mL IVPB        1 g 200 mL/hr over 30 Minutes Intravenous Every 24 hours 09/08/23 0952        Significant Hospital Events: Including procedures, antibiotic start and stop dates in addition to other pertinent events   09/08/23: Admit to ICU with upper gastrointestinal bleeding s/p emergent endoscopy requiring post-operative mechanical ventilatory support. 09/09/23: No significant events noted overnight.  No report of bleeding, BP stable, not requiring vasopressors. Hgb trending down (suspecting dilutional as all cell lines decreased).  Discussed with GI, no plan for additional intervention.  Awake and alert, tolerating PSV 5/5, EXTUBATED.  Interim History / Subjective:  As outlined above under "Significant Hospital Events" section  Objective   Blood  pressure (!) 140/69, pulse 93, temperature 98.7 F (37.1 C), temperature source Axillary, resp. rate 17, height 5\' 3"  (1.6 m), weight 60.8 kg, SpO2 100%.    Vent Mode: PRVC FiO2 (%):  [28 %] 28 % Set Rate:  [16 bmp] 16 bmp Vt Set:  [330 mL] 330 mL PEEP:  [5 cmH20] 5 cmH20   Intake/Output Summary (Last 24 hours) at 09/09/2023 0801 Last data filed at 09/09/2023 0700 Gross per 24 hour  Intake 316.44 ml  Output 1350 ml  Net -1033.56 ml   Filed Weights   09/07/23 1938  Weight: 60.8 kg    Examination: General: Adult female, critically ill, lying in bed intubated & awake on mechanical ventilation, NAD HEENT: MM pink/moist, anicteric, atraumatic, neck supple, orally intubated Neuro: Off sedation, awake and alert, following commands and nodding to questions appropriately, no focal deficits noted, pupils PERRL CV: s1s2 RRR, NSR on monitor, no r/m/g Pulm: Clear breath sounds throughout, even, overbreathing the vent  GI: soft, rounded, non tender, bs x 4 GU: foley in place with clear yellow urine Skin: extensive ecchymosis on BUE, one infiltrated IV and on LUE hematoma from attempted PIV placement   (In order: RUE infiltration of LR, LUE attempted PIV placement, LLE chronic wound) Extremities: warm/dry, pulses + 2 R/P, +1 edema noted BLE  Resolved Hospital Problem list     Assessment & Plan:   #Acute Blood Loss Anemia due to ... #Acute Upper Gastrointestinal Bleed secondary to large cratered esophageal ulcer & erosive esophagitis s/p EGD  -Monitor for S/Sx of bleeding -Trend CBC -SCD's for VTE Prophylaxis  -Transfuse for Hgb <7 -High dose Protonix  IV 40 mg q8h -GI following, appreciate input ~ no plan for repeat intervention -NPO for now until cleared by GI  #Intubated for airway protection #Post Operative Mechanical Ventilation PMHx: COPD, asthma Extubated 4/19 (now DNR/DNI) -Supplemental O2 as needed to maintain O2 sats 88 to 92% -Follow intermittent Chest X-ray & ABG as  needed -Bronchodilators prn -ABX as above -Pulmonary toilet as able  #Leukocytosis suspect reactive in the setting of bleeding -Monitor fever curve -Trend WBC's & Procalcitonin -Follow cultures as above -Continue empiric Ceftriaxone  pending cultures & sensitivities  #Chest Discomfort ~ RESOLVED #Mildly Elevated Troponin due to Demand Ischemia #Chronic HFpEF without acute exacerbation  PMHx: HTN, HLD -Echocardiogram 11/02/21: LVEF 70-75%, mild LVH, grade II diastolic dysfunction, RV systolic function mildly reduced, RV size mildly enlarged -Continuous cardiac monitoring -Maintain MAP >65 -Vasopressors as needed to maintain MAP goal ~ not requiring -Transfusions as indicated -Lactic acid is normalized -HS Troponin minimally elevated -Diuresis as BP and renal function permits     Best Practice (right click and "Reselect all SmartList Selections" daily)  Diet/type: NPO DVT prophylaxis SCD Pressure ulcer(s): N/A GI prophylaxis: PPI Lines: N/A Foley:  Yes, and it is still needed Code Status:  DNR Last date of multidisciplinary goals of care discussion [4/19]  4/19: Pt's son updated via telephone by Dr. Lucina Sabal  Labs   CBC: Recent Labs  Lab 09/07/23 1953 09/07/23 2233 09/08/23 0422 09/08/23 0811 09/09/23 0407  WBC 14.8* 14.7* 13.0* 14.7* 11.5*  NEUTROABS 12.4* 12.3*  --   --   --   HGB 11.1* 10.4* 9.0* 10.3* 8.1*  HCT 34.1* 32.4* 27.7* 31.5* 24.5*  MCV 93.4 95.3 91.4 94.0 93.5  PLT 236 230 231 256 194    Basic Metabolic Panel: Recent Labs  Lab 09/07/23 1953 09/08/23 0422 09/09/23 0407  NA 131* 131* 135  K 4.4 4.1 3.8  CL 98 98 103  CO2 24 25 27   GLUCOSE 163* 156* 103*  BUN 41* 48* 38*  CREATININE 0.73 0.76 0.87  CALCIUM  8.9 8.5* 8.4*  MG  --  1.7  --   PHOS  --  3.4 3.4   GFR: Estimated Creatinine Clearance: 28.4 mL/min (by C-G formula based on SCr of 0.87 mg/dL). Recent Labs  Lab 09/07/23 2233 09/08/23 0422 09/08/23 0611 09/08/23 0811  09/09/23 0407  PROCALCITON  --   --  <0.10  --   --   WBC 14.7* 13.0*  --  14.7* 11.5*  LATICACIDVEN  --  1.4  --   --   --     Liver Function Tests: Recent Labs  Lab 09/07/23 1953 09/09/23 0407  AST 27  --   ALT 15  --   ALKPHOS 59  --   BILITOT 0.9  --   PROT 7.0  --   ALBUMIN  3.2* 2.5*   No results for input(s): "LIPASE", "AMYLASE" in the last 168 hours. No results for input(s): "AMMONIA" in the last 168 hours.  ABG    Component Value Date/Time   PHART 7.36 09/08/2023 0336   PCO2ART 47 09/08/2023 0336   PO2ART 100 09/08/2023 0336   HCO3 26.6 09/08/2023 0336   O2SAT 98.4 09/08/2023 0336     Coagulation Profile: Recent Labs  Lab 09/07/23 2023  INR 1.1    Cardiac Enzymes: No results for input(s): "CKTOTAL", "CKMB", "CKMBINDEX", "TROPONINI" in the last 168 hours.  HbA1C: Hemoglobin A1C  Date/Time Value Ref Range Status  03/14/2013 04:44 PM 6.0 4.2 - 6.3 % Final    Comment:    The American Diabetes Association recommends that a primary goal of therapy should be <7% and that physicians should reevaluate the treatment regimen in patients with HbA1c values consistently >8%.    Hgb A1c MFr Bld  Date/Time Value Ref Range Status  11/02/2021 04:16 AM 5.7 (H) 4.8 - 5.6 % Final    Comment:    (NOTE) Pre diabetes:          5.7%-6.4%  Diabetes:              >6.4%  Glycemic control for   <7.0% adults with diabetes   03/17/2020 07:04 AM 5.5 4.8 - 5.6 % Final    Comment:    (NOTE)         Prediabetes: 5.7 - 6.4         Diabetes: >6.4         Glycemic control for adults with diabetes: <7.0     CBG: Recent Labs  Lab 09/08/23 1622 09/08/23 1945 09/09/23 0027 09/09/23 0400 09/09/23 0745  GLUCAP 132* 111* 101* 90 84    Review of Systems: positives in BOLD  Unable to assess due to intubation/sedation  Past Medical History:  She,  has a past medical history of Anxiety, Arthritis, Asthma, COPD (chronic obstructive pulmonary disease) (HCC), GERD  (gastroesophageal reflux disease), Heart murmur, Hernia, Hyperlipidemia, Hypertension, Melanoma (HCC), Syncope and collapse, and Urine incontinence.   Surgical History:   Past Surgical History:  Procedure Laterality Date   ABDOMINAL HYSTERECTOMY     AMPUTATION TOE Right 06/14/2018   Procedure: AMPUTATION TOE - RIGHT 5TH;  Surgeon: Sharlyn Deaner, DPM;  Location: [facility] ORS;  Service: Podiatry;  Laterality: Right;   CHOLECYSTECTOMY     HERNIA REPAIR     INTRAMEDULLARY (IM) NAIL INTERTROCHANTERIC Right 06/22/2019   Procedure: INTRAMEDULLARY (IM) NAIL  INTERTROCHANTRIC;  Surgeon: Wes Hamman, MD;  Location: [facility];  Service: Orthopedics;  Laterality: Right;     Social History:   reports that she has never smoked. She has never used smokeless tobacco. She reports that she does not drink alcohol and does not use drugs.   Family History:  Her family history includes Cancer in her brother and sister; Heart attack in her brother and son; Heart disease in her brother and sister; Parkinson's disease in her son; Stroke in her father.   Allergies Allergies  Allergen Reactions   Statins Other (See Comments)    Very bad muscle aches   Tramadol Itching   Levaquin  [Levofloxacin  In D5w] Diarrhea and Other (See Comments)    Confusion, hallucinations   Neomycin-Polymyxin-Gramicidin Other (See Comments)   Penicillins     Tolerates cephalosporins Did it involve swelling of the face/tongue/throat, SOB, or low BP? Unknown Did it involve sudden or severe rash/hives, skin peeling, or any reaction on the inside of your mouth or nose? Unknown Did you need to seek medical attention at a hospital or doctor's office? Unknown When did it last happen? Unknown If all above answers are "NO", may proceed with cephalosporin use.   Sulfa Antibiotics    Tetracyclines & Related      Home Medications  Prior to Admission medications   Medication Sig Start Date End Date Taking? Authorizing Provider  alendronate   (FOSAMAX ) 70 MG tablet Take 70 mg by mouth every 7 (seven) days. monday    [provider]  enalapril  (VASOTEC ) 20 MG tablet Take 20 mg by mouth daily. 01/31/20   [provider]  feeding supplement, ENSURE ENLIVE, (ENSURE ENLIVE) LIQD Take 237 mLs by mouth 2 (two) times daily between meals. 06/25/19   Regalado, Belkys A, MD  FLONASE  ALLERGY RELIEF 50 MCG/ACT nasal spray Place 2 sprays into both nostrils daily. 08/16/21   [provider]  guaifenesin  (ROBITUSSIN) 100 MG/5ML syrup Take 200 mg by mouth every 6 (six) hours as needed for cough.    [provider]  lactulose  (CHRONULAC ) 10 GM/15ML solution Take 15 mLs by mouth daily as needed. 10/23/21   [provider]  Menthol , Topical Analgesic, 4 % GEL Apply 1 Application topically 3 (three) times daily as needed. Apply to affected area of legs and neck    [provider]  mirabegron  ER (MYRBETRIQ ) 25 MG TB24 tablet Take 1 tablet (25 mg total) by mouth daily. 01/12/21   Vaillancourt, Samantha, PA-C  nitroGLYCERIN  (NITROSTAT ) 0.4 MG SL tablet Place 0.4 mg under the tongue every 5 (five) minutes as needed for chest pain.    [provider]  nystatin  cream (MYCOSTATIN ) Apply 1 application  topically as needed (panniculitis). Apply to pannus    [provider]  omeprazole  (PRILOSEC) 20 MG capsule TAKE 1 CAPSULE BY MOUTH EVERY DAY. 11/15/17   Kent Pear, MD  polyethylene glycol (MIRALAX  / GLYCOLAX ) 17 g packet Take 17 g by mouth daily. 06/26/19   Regalado, Belkys A, MD  propranolol  (INDERAL ) 40 MG tablet Take 0.5 tablets (20 mg total) by mouth 2 (two) times daily. 02/28/23   Patel, Sona, MD  Psyllium 400 MG CAPS Take 0.4 g by mouth in the morning and at bedtime.    [provider]  senna (SENOKOT) 8.6 MG TABS tablet Take 1 tablet by mouth at bedtime.    [provider]  sertraline  (ZOLOFT ) 100 MG tablet TAKE ONE TABLET BY MOUTH EVERY DAY 11/15/17  Kent Pear, MD   SM ASPIRIN  LOW DOSE 81 MG chewable tablet Chew 1 tablet (81 mg total) by mouth daily. 03/07/23   Patel, Sona, MD  sodium chloride  (OCEAN) 0.65 % nasal spray Place 1 spray into the nose every 2 (two) hours as needed for congestion. 09/06/21   [provider]  tapentadol  (NUCYNTA ) 50 MG tablet Take 50 mg by mouth 3 (three) times daily.    [provider]  VENTOLIN  HFA 108 (90 Base) MCG/ACT inhaler Inhale 2 puffs into the lungs every 4 (four) hours as needed. 12/04/20   [provider]  verapamil  (VERELAN ) 180 MG 24 hr capsule Take 1 capsule (180 mg total) by mouth at bedtime. 02/28/23   Patel, Sona, MD     Critical care time: 40 minutes    Cherylann Corpus, AGACNP-BC [facility] Prefer epic messenger for cross cover needs If after hours, please call E-link

## 2023-09-09 NOTE — Plan of Care (Signed)
  Problem: Education: Goal: Knowledge of General Education information will improve Description: Including pain rating scale, medication(s)/side effects and non-pharmacologic comfort measures Outcome: Progressing   Problem: Clinical Measurements: Goal: Ability to maintain clinical measurements within normal limits will improve Outcome: Progressing Goal: Diagnostic test results will improve Outcome: Progressing Goal: Respiratory complications will improve Outcome: Progressing Goal: Cardiovascular complication will be avoided Outcome: Progressing   Problem: Activity: Goal: Risk for activity intolerance will decrease Outcome: Not Progressing   Problem: Nutrition: Goal: Adequate nutrition will be maintained Outcome: Not Progressing   Problem: Coping: Goal: Level of anxiety will decrease Outcome: Progressing   Problem: Elimination: Goal: Will not experience complications related to urinary retention Outcome: Progressing   Problem: Pain Managment: Goal: General experience of comfort will improve and/or be controlled Outcome: Progressing

## 2023-09-09 NOTE — Progress Notes (Signed)
 Patient extubated to 2L [place] with no complications. Oxygen  Saturation on 2L is 100%.

## 2023-09-09 NOTE — Plan of Care (Signed)
   Problem: Clinical Measurements: Goal: Diagnostic test results will improve Outcome: Progressing Goal: Respiratory complications will improve Outcome: Progressing

## 2023-09-09 NOTE — Consult Note (Signed)
 PHARMACY CONSULT NOTE - ELECTROLYTES  Pharmacy Consult for Electrolyte Monitoring and Replacement   Recent Labs: Potassium (mmol/L)  Date Value  09/09/2023 3.8  06/07/2013 3.8   Magnesium  (mg/dL)  Date Value  16/02/9603 1.7   Calcium  (mg/dL)  Date Value  54/01/8118 8.4 (L)   Calcium , Total (mg/dL)  Date Value  14/78/2956 9.1   Albumin  (g/dL)  Date Value  21/30/8657 2.5 (L)  06/01/2013 3.3 (L)   Phosphorus (mg/dL)  Date Value  84/69/6295 3.4   Sodium (mmol/L)  Date Value  09/09/2023 135  06/13/2014 138  06/07/2013 141   Height: 5\' 3"  (160 cm) Weight: 60.8 kg (134 lb) IBW/kg (Calculated) : 52.4 Estimated Creatinine Clearance: 28.4 mL/min (by C-G formula based on SCr of 0.87 mg/dL).  Assessment  Cindy Graham is a 89 y.o. female presenting with GI bleeding secondary to very large cratered esophageal ulcer. PMH significant for asthma, COPD, HLD, HTN, melanoma, GERD, anxiety, arthritis. Pharmacy has been consulted to monitor and replace electrolytes.  Diet: NPO MIVF: N/A Pertinent medications: N/A  Goal of Therapy: Electrolytes within normal limits  Plan:  No replacement currently indicated Follow-up electrolytes tomorrow AM  Thank you for allowing pharmacy to be a part of this patient's care.  Cindy Graham A [name] 09/09/2023 7:25 AM

## 2023-09-10 DIAGNOSIS — Z515 Encounter for palliative care: Secondary | ICD-10-CM | POA: Diagnosis not present

## 2023-09-10 DIAGNOSIS — K922 Gastrointestinal hemorrhage, unspecified: Secondary | ICD-10-CM | POA: Diagnosis not present

## 2023-09-10 DIAGNOSIS — K920 Hematemesis: Secondary | ICD-10-CM | POA: Diagnosis not present

## 2023-09-10 LAB — CBC
HCT: 23.6 % — ABNORMAL LOW (ref 36.0–46.0)
Hemoglobin: 7.5 g/dL — ABNORMAL LOW (ref 12.0–15.0)
MCH: 30.5 pg (ref 26.0–34.0)
MCHC: 31.8 g/dL (ref 30.0–36.0)
MCV: 95.9 fL (ref 80.0–100.0)
Platelets: 192 10*3/uL (ref 150–400)
RBC: 2.46 MIL/uL — ABNORMAL LOW (ref 3.87–5.11)
RDW: 15.9 % — ABNORMAL HIGH (ref 11.5–15.5)
WBC: 7.9 10*3/uL (ref 4.0–10.5)
nRBC: 0 % (ref 0.0–0.2)

## 2023-09-10 LAB — RENAL FUNCTION PANEL
Albumin: 2.4 g/dL — ABNORMAL LOW (ref 3.5–5.0)
Anion gap: 10 (ref 5–15)
BUN: 27 mg/dL — ABNORMAL HIGH (ref 8–23)
CO2: 27 mmol/L (ref 22–32)
Calcium: 8.3 mg/dL — ABNORMAL LOW (ref 8.9–10.3)
Chloride: 102 mmol/L (ref 98–111)
Creatinine, Ser: 0.68 mg/dL (ref 0.44–1.00)
GFR, Estimated: >60 mL/min (ref >60)
Glucose, Bld: 85 mg/dL (ref 70–99)
Phosphorus: 3 mg/dL (ref 2.5–4.6)
Potassium: 3.5 mmol/L (ref 3.5–5.1)
Sodium: 139 mmol/L (ref 135–145)

## 2023-09-10 LAB — HEMOGLOBIN AND HEMATOCRIT, BLOOD
HCT: 24 % — ABNORMAL LOW (ref 36.0–46.0)
HCT: 31.6 % — ABNORMAL LOW (ref 36.0–46.0)
Hemoglobin: 7.7 g/dL — ABNORMAL LOW (ref 12.0–15.0)
Hemoglobin: 10.5 g/dL — ABNORMAL LOW (ref 12.0–15.0)

## 2023-09-10 LAB — GLUCOSE, CAPILLARY
Glucose-Capillary: 78 mg/dL (ref 70–99)
Glucose-Capillary: 82 mg/dL (ref 70–99)
Glucose-Capillary: 85 mg/dL (ref 70–99)
Glucose-Capillary: 91 mg/dL (ref 70–99)
Glucose-Capillary: 94 mg/dL (ref 70–99)

## 2023-09-10 LAB — PREPARE RBC (CROSSMATCH)

## 2023-09-10 MED ORDER — SODIUM CHLORIDE 0.9% IV SOLUTION: Freq: Once | INTRAVENOUS | Status: AC

## 2023-09-10 MED ORDER — VERAPAMIL HCL ER 240 MG PO TBCR
240.0000 mg | EXTENDED_RELEASE_TABLET | Freq: Every day | ORAL | Status: DC
  Administered 2023-09-10 – 2023-09-12 (×3): 240 mg via ORAL
  Filled 2023-09-10 (×4): qty 1

## 2023-09-10 MED ORDER — ENALAPRIL MALEATE 10 MG PO TABS
20.0000 mg | ORAL_TABLET | Freq: Every day | ORAL | Status: DC
  Administered 2023-09-10 – 2023-09-12 (×3): 20 mg via ORAL
  Filled 2023-09-10 (×4): qty 2

## 2023-09-10 MED ORDER — FUROSEMIDE 10 MG/ML IJ SOLN
20.0000 mg | Freq: Once | INTRAMUSCULAR | Status: AC
  Administered 2023-09-10: 20 mg via INTRAVENOUS
  Filled 2023-09-10: qty 2

## 2023-09-10 NOTE — Progress Notes (Signed)
 Message sent to pharmacy to send patient's B/P medication.

## 2023-09-10 NOTE — Progress Notes (Signed)
 PROGRESS NOTE    [facility]   NWG:[id number] DOB: 1922-03-13  DOA: 09/07/2023 Date of Service: 09/10/23 which is hospital day 2  PCP: [facility] course / significant events:   HPI: 89 yo F presenting to [facility] ED from [facility] via EMS for evaluation of hematemesis.  04/18: Admit to ICU with upper gastrointestinal bleeding s/p emergent endoscopy requiring post-operative mechanical ventilatory support. EGD revealed very large cratered esophageal ulcer occupying lower esophagus with gigantic clot found. She remained intubated postprocedure,  04/19: No bleeding, BP stable. Hgb trending down (suspecting dilutional as all cell lines decreased).  GI, no plan for additional intervention. Extubated  04/20: care transferred to hospitalist service. Hgb am 7.5, giving 1 unit PRBC. Tolerating CLD, advance to FLD  Consultants:  Gastroenterology PCCU Palliative care   Procedures/Surgeries: 09/08/23: EGD      ASSESSMENT & PLAN:   Acute Blood Loss Anemia due to ... Acute Upper Gastrointestinal Bleed secondary to large cratered esophageal ulcer & erosive esophagitis s/p EGD  SIRS associated w/ upper GI bleed - resolved  Monitor for bleeding Follow CBC, HH No antiplatelet/anticoag High dose Protonix  IV 40 mg q8h for now, po PPI on discharge  GI - no plan for repeat intervention GI - ulcer probably will conitnue oozing blood, recommend transuse unit PRBC will order this  Advance diet slowly GI has s/o today   Leukocytosis suspect reactive in the setting of bleeding - resolved Monitor fever curve Trend WBC's & Procalcitonin Follow cultures as above D/c abx    #Chest Discomfort ~ RESOLVED #Mildly Elevated Troponin due to Demand Ischemia #Chronic HFpEF without acute exacerbation  PMHx: HTN, HLD -Echocardiogram 11/02/21: LVEF 70-75%, mild LVH, grade II diastolic dysfunction, RV systolic function mildly reduced, RV size mildly enlarged cardiac  monitoring Maintain MAP >65 I&O Diuresis as needed / as BP and renal function permits       No concerns based on BMI: Body mass index is 23.74 kg/m.  Underweight - under 18  overweight - 25 to 29 obese - 30 or more Class 1 obesity: BMI of 30.0 to 34 Class 2 obesity: BMI of 35.0 to 39 Class 3 obesity: BMI of 40.0 to 49 Super Morbid Obesity: BMI 50-59 Super-super Morbid Obesity: BMI 60+ Significantly low or high BMI is associated with higher medical risk.  Weight management advised as adjunct to other disease management and risk reduction treatments    DVT prophylaxis: no Rx ppx d/t GIB IV fluids: no continuous IV fluids  Nutrition: advance slowly, FLD today  Central lines / other devices: none  Code Status: DNR. if her condition worsens post extubation, plan switch to comfort.  ACP documentation reviewed: DNR on file in [facility]  TOC needs: TBD Medical barriers to dispo: advancing diet a  dmonitoring Hgb, continuing IV PPI. Expected medical readiness for discharge possibly tomorrow .              Subjective / Brief ROS:  Patient reports no concerns this morning Denies CP/SOB.  Pain controlled.  Denies new weakness.  Tolerating diet.  Reports no concerns w/ urination/defecation.    Family Communication: spoke w/ son and daughter over the phone 09/10/23 5:21 PM      Objective Findings:  Vitals:   09/10/23 1400 09/10/23 1500 09/10/23 1600 09/10/23 1700  BP: (!) 145/61 (!) 154/60 (!) 137/59 (!) 156/54  Pulse: 91 83 97 91  Resp: 18 20 12  (!) 21  Temp:   98.6 F (  37 C)   TempSrc:   Oral   SpO2: 96% 96% 98% 93%  Weight:      Height:        Intake/Output Summary (Last 24 hours) at 09/10/2023 1721 Last data filed at 09/10/2023 1600 Gross per 24 hour  Intake 349.87 ml  Output 1650 ml  Net -1300.13 ml   Filed Weights   09/07/23 1938  Weight: 60.8 kg      Physical Exam Constitutional:      General: She is not in acute distress. Cardiovascular:      Rate and Rhythm: Normal rate and regular rhythm.     Heart sounds: Murmur heard.  Pulmonary:     Effort: Pulmonary effort is normal.     Breath sounds: Normal breath sounds.  Abdominal:     General: Abdomen is flat.     Palpations: Abdomen is soft.  Musculoskeletal:     Right lower leg: No edema.     Left lower leg: No edema.  Skin:    General: Skin is warm and dry.  Neurological:     Mental Status: She is alert. Mental status is at baseline.  Psychiatric:        Mood and Affect: Mood normal.        Behavior: Behavior normal.          Scheduled Medications:   sodium chloride    Intravenous Once   Chlorhexidine  Gluconate Cloth  6 each Topical Daily   pantoprazole  (PROTONIX ) IV  40 mg Intravenous Q8H   Followed by   [facility] ON 09/11/2023] pantoprazole  (PROTONIX ) IV  40 mg Intravenous Q12H    Continuous Infusions:    PRN Medications:  docusate sodium , fentaNYL  (SUBLIMAZE ) injection, ondansetron  (ZOFRAN ) IV, mouth rinse, mouth rinse, polyethylene glycol  Antimicrobials from admission:  Anti-infectives (From admission, onward)    Start     Dose/Rate Route Frequency Ordered Stop   09/08/23 1045  cefTRIAXone  (ROCEPHIN ) 1 g in sodium chloride  0.9 % 100 mL IVPB  Status:  Discontinued        1 g 200 mL/hr over 30 Minutes Intravenous Every 24 hours 09/08/23 0952 09/10/23 1649           Data Reviewed:  I have personally reviewed the following...  CBC: Recent Labs  Lab 09/07/23 1953 09/07/23 2233 09/08/23 0422 09/08/23 0811 09/09/23 0407 09/09/23 1051 09/10/23 0407 09/10/23 1614  WBC 14.8* 14.7* 13.0* 14.7* 11.5*  --  7.9  --   NEUTROABS 12.4* 12.3*  --   --   --   --   --   --   HGB 11.1* 10.4* 9.0* 10.3* 8.1* 7.9* 7.5* 7.7*  HCT 34.1* 32.4* 27.7* 31.5* 24.5* 24.1* 23.6* 24.0*  MCV 93.4 95.3 91.4 94.0 93.5  --  95.9  --   PLT 236 230 231 256 194  --  192  --    Basic Metabolic Panel: Recent Labs  Lab 09/07/23 1953 09/08/23 0422 09/09/23 0407  09/10/23 0407  NA 131* 131* 135 139  K 4.4 4.1 3.8 3.5  CL 98 98 103 102  CO2 24 25 27 27   GLUCOSE 163* 156* 103* 85  BUN 41* 48* 38* 27*  CREATININE 0.73 0.76 0.87 0.68  CALCIUM  8.9 8.5* 8.4* 8.3*  MG  --  1.7  --   --   PHOS  --  3.4 3.4 3.0   GFR: Estimated Creatinine Clearance: 30.9 mL/min (by C-G formula based on SCr of 0.68 mg/dL). Liver Function Tests:  Recent Labs  Lab 09/07/23 1953 09/09/23 0407 09/10/23 0407  AST 27  --   --   ALT 15  --   --   ALKPHOS 59  --   --   BILITOT 0.9  --   --   PROT 7.0  --   --   ALBUMIN  3.2* 2.5* 2.4*   No results for input(s): "LIPASE", "AMYLASE" in the last 168 hours. No results for input(s): "AMMONIA" in the last 168 hours. Coagulation Profile: Recent Labs  Lab 09/07/23 2023  INR 1.1   Cardiac Enzymes: No results for input(s): "CKTOTAL", "CKMB", "CKMBINDEX", "TROPONINI" in the last 168 hours. BNP (last 3 results) No results for input(s): "PROBNP" in the last 8760 hours. HbA1C: No results for input(s): "HGBA1C" in the last 72 hours. CBG: Recent Labs  Lab 09/09/23 2042 09/09/23 2320 09/10/23 0456 09/10/23 1157 09/10/23 1622  GLUCAP 99 91 82 85 78   Lipid Profile: Recent Labs    09/09/23 0407  TRIG 96   Thyroid  Function Tests: No results for input(s): "TSH", "T4TOTAL", "FREET4", "T3FREE", "THYROIDAB" in the last 72 hours. Anemia Panel: No results for input(s): "VITAMINB12", "FOLATE", "FERRITIN", "TIBC", "IRON", "RETICCTPCT" in the last 72 hours. Most Recent Urinalysis On File:     Component Value Date/Time   COLORURINE AMBER (A) 11/01/2021 1127   APPEARANCEUR HAZY (A) 11/01/2021 1127   APPEARANCEUR Cloudy (A) 12/31/2020 1355   LABSPEC 1.019 11/01/2021 1127   LABSPEC 1.018 06/01/2013 0933   PHURINE 5.0 11/01/2021 1127   GLUCOSEU NEGATIVE 11/01/2021 1127   GLUCOSEU NEGATIVE 06/17/2014 1123   HGBUR SMALL (A) 11/01/2021 1127   BILIRUBINUR NEGATIVE 11/01/2021 1127   BILIRUBINUR Negative 12/31/2020 1355    BILIRUBINUR Negative 06/01/2013 0933   KETONESUR NEGATIVE 11/01/2021 1127   PROTEINUR 30 (A) 11/01/2021 1127   UROBILINOGEN 0.2 11/28/2017 1207   UROBILINOGEN 0.2 06/17/2014 1123   NITRITE NEGATIVE 11/01/2021 1127   LEUKOCYTESUR LARGE (A) 11/01/2021 1127   LEUKOCYTESUR 2+ 06/01/2013 0933   Sepsis Labs: @LABRCNTIP (procalcitonin:4,lacticidven:4) Microbiology: Recent Results (from the past 240 hours)  MRSA Next Gen by PCR, Nasal     Status: None   Collection Time: 09/08/23  3:07 AM   Specimen: Nasal Mucosa; Nasal Swab  Result Value Ref Range Status   MRSA by PCR Next Gen NOT DETECTED NOT DETECTED Final    Comment: (NOTE) The GeneXpert MRSA Assay (FDA approved for NASAL specimens only), is one component of a comprehensive MRSA colonization surveillance program. It is not intended to diagnose MRSA infection nor to guide or monitor treatment for MRSA infections. Test performance is not FDA approved in patients less than 68 years old. Performed at [facility], [address], [place], [place] [address]       Radiology Studies last 3 days: [facility] Chest [facility] 1 View Result Date: 09/08/2023 CLINICAL DATA:  Status post intubation EXAM: PORTABLE CHEST 1 VIEW COMPARISON:  Film from the previous day. FINDINGS: Endotracheal tube is noted just above the aortic arch 5.6 cm from the carina. Cardiac shadow is within normal limits. Lungs are clear. No bony abnormality is seen. IMPRESSION: Endotracheal tube in satisfactory position. Electronically Signed   By: Violeta Grey M.D.   On: 09/08/2023 03:01   CT Soft Tissue Neck W Contrast Result Date: 09/08/2023 CLINICAL DATA:  Foreign body ingestion with possible damage to posterior oropharynx or anywhere along the esophagus. Vomiting entirely blood EXAM: CT NECK WITH CONTRAST TECHNIQUE: Multidetector CT imaging of the neck was performed using the  standard protocol following the bolus administration of intravenous contrast. RADIATION DOSE REDUCTION:  This exam was performed according to the departmental dose-optimization program which includes automated exposure control, adjustment of the mA and/or kV according to patient size and/or use of iterative reconstruction technique. CONTRAST:  75mL OMNIPAQUE  IOHEXOL  300 MG/ML  SOLN COMPARISON:  None Available. FINDINGS: Pharynx and larynx: Normal. No mass or swelling. No radiopaque foreign body identified. Salivary glands: No inflammation, mass, or stone. Thyroid : Subcentimeter thyroid  nodules which do not require further imaging follow-up (ref: J Am Coll Radiol. 2015 Feb;12(2): 143-50). Lymph nodes: None enlarged or abnormal density. Vascular: Limited evaluation due to non arterial contrast bolus timing. The major arteries appear grossly patent in the neck. Limited intracranial: Negative. Visualized orbits: Negative. Mastoids and visualized paranasal sinuses: Clear. Skeleton: Remote craniocervical fractures. Upper chest: Visualized lung apices are clear. Small right pleural effusion. Aortic atherosclerosis. IMPRESSION: 1. Distended fluid-filled esophagus with layering hyperdensity, suggestive of hemorrhage. A CTA could better assess for active extravasation if clinically warranted. 2. No radiopaque foreign body or significant edema identified. 3. Small right pleural effusion. Electronically Signed   By: Stevenson Elbe M.D.   On: 09/08/2023 00:48   CT CHEST W CONTRAST Result Date: 09/07/2023 CLINICAL DATA:  Foreign body ingestion with possible damage to posterior oropharynx or anywhere along the esophagus. Vomiting entirely blood EXAM: CT CHEST WITH CONTRAST TECHNIQUE: Multidetector CT imaging of the chest was performed during intravenous contrast administration. RADIATION DOSE REDUCTION: This exam was performed according to the departmental dose-optimization program which includes automated exposure control, adjustment of the mA and/or kV according to patient size and/or use of iterative reconstruction  technique. CONTRAST:  75mL OMNIPAQUE  IOHEXOL  300 MG/ML  SOLN COMPARISON:  11/03/2017 FINDINGS: Cardiovascular: Heart is normal size. Small pericardial effusion. Diffuse coronary artery and aortic atherosclerosis. No aneurysm. Mediastinum/Nodes: No mediastinal, hilar, or axillary adenopathy. Trachea and thyroid  unremarkable. Moderate-sized hiatal hernia. Esophagus is fluid-filled. Focal area of high density posteriorly along the upper thoracic esophagus near the thoracic inlet. Cannot exclude active extravasation of contrast. Lungs/Pleura: Small bilateral pleural effusions. Bibasilar scarring. No additional confluent airspace opacity. Upper Abdomen: No acute findings Musculoskeletal: Chest wall soft tissues are unremarkable. No acute bony abnormality. IMPRESSION: Distended, fluid-filled esophagus. Area of high density noted along the posterior wall in the upper thoracic esophagus near the thoracic inlet. Cannot exclude active extravasation of contrast. Small bilateral pleural effusions.  Bibasilar scarring. Coronary artery disease. Aortic Atherosclerosis (ICD10-I70.0). Electronically Signed   By: Janeece Mechanic M.D.   On: 09/07/2023 21:17   DG Chest Portable 1 View Result Date: 09/07/2023 CLINICAL DATA:  Vomiting blood EXAM: PORTABLE CHEST 1 VIEW COMPARISON:  None Available. FINDINGS: Heart and mediastinal contours within normal limits. Increased markings in the lung bases likely reflect atelectasis or scarring. No confluent opacities or effusions. No acute bony abnormality. IMPRESSION: Bibasilar atelectasis or scarring. Electronically Signed   By: Janeece Mechanic M.D.   On: 09/07/2023 21:14         Melodi Sprung, DO [facility] 09/10/2023, 5:21 PM    Dictation software may have been used to generate the above note. Typos may occur and escape review in typed/dictated notes. Please contact Dr Authur Leghorn directly for clarity if needed.  Staff may message me via secure chat in Epic  but this may  not receive an immediate response,  please page me for urgent matters!  If 7PM-7AM, please contact night coverage [email or web address]

## 2023-09-10 NOTE — Hospital Course (Addendum)
 [facility] course / significant events:   HPI: 89 yo F presenting to [place] [facility] from [facility] EMS for evaluation of hematemesis.  04/18: Admit to ICU with upper gastrointestinal bleeding s/p emergent endoscopy requiring post-operative mechanical ventilatory support. EGD revealed very large cratered esophageal ulcer occupying lower esophagus with gigantic clot found. She remained intubated postprocedure,  04/19: No bleeding, BP stable. Hgb trending down (suspecting dilutional as all cell lines decreased).  GI, no plan for additional intervention. Extubated  04/20: care transferred to hospitalist service. Hgb am 7.5, giving 1 unit PRBC. Tolerating CLD, advance to FLD 04/21: doing well, per PT ok to go back to ALF, we are waiting for her facility to send someone to evaluate if they will take her back   Consultants:  Gastroenterology PCCU Palliative care   Procedures/Surgeries: 09/08/23: EGD      ASSESSMENT & PLAN:   Acute Blood Loss Anemia due to ... Acute Upper Gastrointestinal Bleed secondary to large cratered esophageal ulcer & erosive esophagitis s/p EGD  SIRS associated w/ upper GI bleed - resolved  Monitor for bleeding Follow CBC, HH No antiplatelet/anticoag High dose Protonix  IV 40 mg q8h for now, po PPI on discharge  GI - no plan for repeat intervention GI - ulcer probably will conitnue oozing blood, recommend transuse unit PRBC will order this  Advance diet slowly GI has s/o today   Leukocytosis suspect reactive in the setting of bleeding - resolved Monitor fever curve Trend WBC's & Procalcitonin Follow cultures as above D/c abx    #Chest Discomfort ~ RESOLVED #Mildly Elevated Troponin due to Demand Ischemia #Chronic HFpEF without acute exacerbation  PMHx: HTN, HLD -Echocardiogram 11/02/21: LVEF 70-75%, mild LVH, grade II diastolic dysfunction, RV systolic function mildly reduced, RV size mildly enlarged cardiac monitoring Maintain MAP >65 I&O Diuresis as  needed / as BP and renal function permits       No concerns based on BMI: Body mass index is 23.74 kg/m.  Underweight - under 18  overweight - 25 to 29 obese - 30 or more Class 1 obesity: BMI of 30.0 to 34 Class 2 obesity: BMI of 35.0 to 39 Class 3 obesity: BMI of 40.0 to 49 Super Morbid Obesity: BMI 50-59 Super-super Morbid Obesity: BMI 60+ Significantly low or high BMI is associated with higher medical risk.  Weight management advised as adjunct to other disease management and risk reduction treatments    DVT prophylaxis: no Rx ppx d/t GIB IV fluids: no continuous IV fluids  Nutrition: advance slowly, FLD today  Central lines / other devices: none  Code Status: DNR. if her condition worsens post extubation, plan switch to comfort.  ACP documentation reviewed: DNR on file in [facility]  TOC needs: TBD Medical barriers to dispo: advancing diet a  dmonitoring Hgb, continuing IV PPI. Expected medical readiness for discharge possibly tomorrow .

## 2023-09-10 NOTE — Plan of Care (Signed)
  Problem: Education: Goal: Knowledge of General Education information will improve Description: Including pain rating scale, medication(s)/side effects and non-pharmacologic comfort measures Outcome: Progressing   Problem: Health Behavior/Discharge Planning: Goal: Ability to manage health-related needs will improve Outcome: Progressing   Problem: Clinical Measurements: Goal: Ability to maintain clinical measurements within normal limits will improve Outcome: Progressing Goal: Will remain free from infection Outcome: Progressing Goal: Respiratory complications will improve Outcome: Progressing Goal: Cardiovascular complication will be avoided Outcome: Progressing   Problem: Nutrition: Goal: Adequate nutrition will be maintained Outcome: Progressing   

## 2023-09-10 NOTE — Progress Notes (Signed)
 Patient's B/P 170/59 and that was on retake; provider notified and ordering home meds and Lasix  post blood transfusion.  Provider ok with continuing with administration of blood transfusion.

## 2023-09-10 NOTE — Progress Notes (Addendum)
 Inpatient Follow-up/Progress Note   Patient ID: Cindy Graham is a 89 y.o. female.  Overnight Events / Subjective Findings No signs of GIB. Hgb relatively stable at 7.5 considering lab draws and fluids. Vital signs stable. Pt tolerating clear liquid diet.  No dysphagia odynophagia or abdominal pain.  No nausea or vomiting no other acute gi complaints.   Review of Systems  HENT:  Negative for trouble swallowing.   Respiratory:  Negative for shortness of breath.   Cardiovascular:  Negative for chest pain.  Gastrointestinal:  Negative for abdominal pain, blood in stool, nausea and vomiting.     Medications  Current Facility-Administered Medications:    cefTRIAXone  (ROCEPHIN ) 1 g in sodium chloride  0.9 % 100 mL IVPB, 1 g, Intravenous, Q24H, Assaker, Jean-Pierre, MD, Stopped at 09/10/23 8119   Chlorhexidine  Gluconate Cloth 2 % PADS 6 each, 6 each, Topical, Daily, Assaker, Jean-Pierre, MD, 6 each at 09/09/23 2044   docusate sodium  (COLACE) capsule 100 mg, 100 mg, Oral, BID PRN, Rust-Chester, Britton L, NP   fentaNYL  (SUBLIMAZE ) injection 25-50 mcg, 25-50 mcg, Intravenous, Q1H PRN, Rust-Chester, Britton L, NP, 50 mcg at 09/08/23 0451   ondansetron  (ZOFRAN ) injection 4 mg, 4 mg, Intravenous, Q6H PRN, Rust-Chester, Gara July, NP   Oral care mouth rinse, 15 mL, Mouth Rinse, PRN, Assaker, Marianne Shirts, MD   Oral care mouth rinse, 15 mL, Mouth Rinse, PRN, Assaker, Marianne Shirts, MD   [COMPLETED] pantoprazole  (PROTONIX ) injection 40 mg, 40 mg, Intravenous, Q5 min, 40 mg at 09/08/23 0407 **FOLLOWED BY** pantoprazole  (PROTONIX ) injection 40 mg, 40 mg, Intravenous, Q8H, 40 mg at 09/10/23 0917 **FOLLOWED BY** [START ON 09/11/2023] pantoprazole  (PROTONIX ) injection 40 mg, 40 mg, Intravenous, Q12H, Vanga, Rohini Reddy, MD   polyethylene glycol (MIRALAX  / GLYCOLAX ) packet 17 g, 17 g, Oral, Daily PRN, Rust-Chester, Jenni Mody L, NP  cefTRIAXone  (ROCEPHIN )  IV Stopped (09/10/23 0952)    docusate sodium ,  fentaNYL  (SUBLIMAZE ) injection, ondansetron  (ZOFRAN ) IV, mouth rinse, mouth rinse, polyethylene glycol   Objective    Vitals:   09/10/23 0900 09/10/23 1000 09/10/23 1100 09/10/23 1200  BP: (!) 139/51 (!) 154/58 (!) 141/49 (!) 145/63  Pulse: 89 88 81 92  Resp: (!) 29 15 (!) 21 20  Temp:    98.8 F (37.1 C)  TempSrc:    Oral  SpO2: 94% 95% 93% 97%  Weight:      Height:         Physical Exam Vitals and nursing note reviewed.  Constitutional:      General: She is not in acute distress.    Appearance: She is ill-appearing. She is not toxic-appearing or diaphoretic.     Comments: Frail-appearing, but normal for age  HENT:     Head: Normocephalic and atraumatic.     Ears:     Comments: HOH    Nose: Nose normal.     Mouth/Throat:     Mouth: Mucous membranes are moist.     Pharynx: Oropharynx is clear.  Eyes:     General: No scleral icterus.    Extraocular Movements: Extraocular movements intact.  Cardiovascular:     Rate and Rhythm: Normal rate and regular rhythm.     Heart sounds: Normal heart sounds. No murmur heard.    No friction rub. No gallop.  Pulmonary:     Effort: Pulmonary effort is normal. No respiratory distress.     Breath sounds: Normal breath sounds. No wheezing, rhonchi or rales.  Abdominal:     General: Bowel sounds are  normal. There is no distension.     Palpations: Abdomen is soft.     Tenderness: There is no abdominal tenderness. There is no guarding or rebound.  Musculoskeletal:     Cervical back: Neck supple.     Right lower leg: No edema.     Left lower leg: No edema.  Skin:    General: Skin is warm and dry.     Coloration: Skin is pale. Skin is not jaundiced.     Comments: Scattered ecchymosis   Neurological:     General: No focal deficit present.     Mental Status: She is alert and oriented to person, place, and time. Mental status is at baseline.  Psychiatric:        Mood and Affect: Mood normal.        Behavior: Behavior normal.         Thought Content: Thought content normal.        Judgment: Judgment normal.      Laboratory Data Recent Labs  Lab 09/07/23 1953 09/07/23 2233 09/08/23 0422 09/08/23 0811 09/09/23 0407 09/09/23 1051 09/10/23 0407  WBC 14.8* 14.7*   < > 14.7* 11.5*  --  7.9  HGB 11.1* 10.4*   < > 10.3* 8.1* 7.9* 7.5*  HCT 34.1* 32.4*   < > 31.5* 24.5* 24.1* 23.6*  PLT 236 230   < > 256 194  --  192  NEUTOPHILPCT 85 85  --   --   --   --   --   LYMPHOPCT 10 10  --   --   --   --   --   MONOPCT 5 5  --   --   --   --   --   EOSPCT 0 0  --   --   --   --   --    < > = values in this interval not displayed.   Recent Labs  Lab 09/07/23 1953 09/08/23 0422 09/09/23 0407 09/10/23 0407  NA 131* 131* 135 139  K 4.4 4.1 3.8 3.5  CL 98 98 103 102  CO2 24 25 27 27   BUN 41* 48* 38* 27*  CREATININE 0.73 0.76 0.87 0.68  CALCIUM  8.9 8.5* 8.4* 8.3*  PROT 7.0  --   --   --   BILITOT 0.9  --   --   --   ALKPHOS 59  --   --   --   ALT 15  --   --   --   AST 27  --   --   --   GLUCOSE 163* 156* 103* 85   Recent Labs  Lab 09/07/23 2023  INR 1.1      Imaging Studies: No results found.   Assessment:   # Severe, large cratered Esophageal Ulcer with clot in lower esophagus - section of clot was removed on EGD on 4/18.  - This was treated with epi and hemospray  - no discrete mass, although visualization was limited per report  # Acute blood loss anemia  # gerd # hyponatremia # COPD, HTN, HLD  Plan:   Continued 40 mg IV protonix  twice daily Tolerating clear liquids can advance to full liquids this evening.  Potentially soft foods on Tuesday Pt is at high risk for rebleed Given age and limited repeat modalities for treatment, no plan for repeat upper endoscopy at this time If this was an esophageal malignancy (as biopsies were not performed given significant bleeding),  pt is of the age where treatment would not be an option, so repeating egd for biopsies would not be useful at this  time 4/19 - Global drop in cell lines, including hgb, but vital signs have remained stable and without signs of GIB (no melena/hematochezia)  4/20 Consider transfusion of 1 u prbc to replenish reserves as she will likely have some continued oozing as ulcer heals.  Hold dvt ppx Monitor H&H.  Transfusion and resuscitation as per primary team Avoid frequent lab draws to prevent lab induced anemia Supportive care and antiemetics as per primary team Maintain two sites IV access Avoid nsaids Monitor for GIB.  GI to sign off. Available as needed. Please do not hesitate to call back regarding questions or concerns.  Management of other medical comorbidities as per primary team  I personally performed the service.  Thank you for allowing us  to participate in this patient's care. Please don't hesitate to call if any questions or concerns arise.   Quintin Buckle, DO [facility]  Portions of the record may have been created with voice recognition software. Occasional wrong-word or 'sound-a-like' substitutions may have occurred due to the inherent limitations of voice recognition software.  Read the chart carefully and recognize, using context, where substitutions may have occurred.

## 2023-09-10 NOTE — Progress Notes (Signed)
 PT Cancellation Note  Patient Details Name: DUYEN BECKOM MRN: [id number] DOB: 1921-11-20   Cancelled Treatment:     PT evaluation and treatment not completed 2/2 to pt Hemoglobin level is 7.5. PT communicated with MD and pt deferred to tomorrow. PT will attempt again tomorrow If the pt is appropriate.    Jacob Cicero H Jazzmyn Filion 09/10/2023, 3:59 PM

## 2023-09-10 NOTE — Progress Notes (Signed)
 Daily Progress Note   Patient Name: Cindy Graham       Date: 09/10/2023 DOB: 1922/04/19  Age: 89 y.o. MRN#: [id number] Attending Physician: Melodi Sprung, DO Primary Care Physician: Cole Daubs, [facility] Admit Date: 09/07/2023  Reason for Consultation/Follow-up: Establishing goals of care  HPI/Brief Hospital Review: 89 y.o. female  with past medical history of asthma, hyperlipidemia, hypertension, GERD, melanoma, anxiety and arthritis admitted from [facility] on 09/07/2023 with multiple episodes of large hematemesis requiring intubation for airway protection.   S/p EGD 4/18 revealing very large cratered esophageal ulcer with gigantic clot treated during EGD and placed on high-dose IV Protonix  Remained intubated postprocedure   Palliative medicine was consulted for assisting with goals of care conversations.  Subjective: Extensive chart review has been completed prior to meeting patient including labs, vital signs, imaging, progress notes, orders, and available advanced directive documents from current and previous encounters.    Visited with Ms. Housewright and her daughter at bedside.  Ms. Hellmann reports feeling well today without acute complaints.  Daughter attempting to feed her beef broth.  Provided medical updates as requested by daughter.  Discussed slight drop in hemoglobin potentially due to anticipated oozing from esophageal ulcer.  Medical team to continue to monitor closely and potential need for transfusion if hemoglobin continues to drop.  Daughter again voices her concerns related to functional status.  Explained PT/OT have been ordered for today.  Daughter explains at baseline Ms. Rosemeyer is basically wheelchair dependent.  Remains unsure if Ms. Grim will be able to  safely return to assisted living.  Again outlined process of potential rehab or long-term care placement for higher level of care.  Goals of care remain clear.  Answered and addressed all questions and concerns.  PMT to continue to follow for ongoing needs and support.  Objective:  Physical Exam Constitutional:      General: She is not in acute distress.    Appearance: She is not ill-appearing.  Pulmonary:     Effort: Pulmonary effort is normal. No respiratory distress.  Skin:    General: Skin is warm and dry.  Neurological:     Mental Status: She is alert.     Motor: Weakness present.             Vital Signs: BP (!) 145/61  Pulse 91   Temp 98.8 F (37.1 C) (Oral)   Resp 18   Ht 5\' 3"  (1.6 m)   Wt 60.8 kg   SpO2 96%   BMI 23.74 kg/m  SpO2: SpO2: 96 % O2 Device: O2 Device: Room Air O2 Flow Rate: O2 Flow Rate (L/min): 2 L/min   Palliative Care Assessment & Plan   Assessment/Recommendation/Plan  PMT to continue to follow for ongoing needs and support  Thank you for allowing the Palliative Medicine Team to assist in the care of this patient.  Total time:  25 minute  Time spent includes: Detailed review of medical records (labs, imaging, vital signs), medically appropriate exam (mental status, respiratory, cardiac, skin), discussed with treatment team, counseling and educating patient, family and staff, documenting clinical information, medication management and coordination of care.  Isadore Marble, DNP, AGNP-C Palliative Medicine   Please contact Palliative Medicine Team phone at [phone] for questions and concerns.

## 2023-09-10 NOTE — Plan of Care (Signed)
 Continuing with plan of care.

## 2023-09-10 NOTE — Progress Notes (Signed)
 OT Cancellation Note  Patient Details Name: Cindy Graham MRN: [id number] DOB: Nov 10, 1921   Cancelled Treatment:    Reason Eval/Treat Not Completed: Patient not medically ready. Pt Hemoglobin level is 7.5. Communicated with MD and pt deferred to eval tomorrow. OT will attempt again tomorrow if the pt is appropriate.   Rosaria Common M.S. OTR/L  09/10/23, 4:29 PM

## 2023-09-11 ENCOUNTER — Encounter: Payer: Self-pay | Admitting: Gastroenterology

## 2023-09-11 DIAGNOSIS — Z515 Encounter for palliative care: Secondary | ICD-10-CM | POA: Diagnosis not present

## 2023-09-11 DIAGNOSIS — Z66 Do not resuscitate: Secondary | ICD-10-CM

## 2023-09-11 DIAGNOSIS — Z7189 Other specified counseling: Secondary | ICD-10-CM

## 2023-09-11 DIAGNOSIS — K922 Gastrointestinal hemorrhage, unspecified: Secondary | ICD-10-CM | POA: Diagnosis not present

## 2023-09-11 DIAGNOSIS — R531 Weakness: Secondary | ICD-10-CM

## 2023-09-11 DIAGNOSIS — K920 Hematemesis: Secondary | ICD-10-CM | POA: Diagnosis not present

## 2023-09-11 LAB — GLUCOSE, CAPILLARY
Glucose-Capillary: 86 mg/dL (ref 70–99)
Glucose-Capillary: 97 mg/dL (ref 70–99)
Glucose-Capillary: 168 mg/dL — ABNORMAL HIGH (ref 70–99)
Glucose-Capillary: 101 mg/dL — ABNORMAL HIGH (ref 70–99)
Glucose-Capillary: 119 mg/dL — ABNORMAL HIGH (ref 70–99)

## 2023-09-11 LAB — TYPE AND SCREEN
ABO/RH(D): O POS
Antibody Screen: NEGATIVE
Unit division: 0

## 2023-09-11 LAB — BPAM RBC
Blood Product Expiration Date: 202505222359
ISSUE DATE / TIME: 202504201822
Unit Type and Rh: 5100

## 2023-09-11 LAB — CBC
HCT: 32.2 % — ABNORMAL LOW (ref 36.0–46.0)
Hemoglobin: 10.7 g/dL — ABNORMAL LOW (ref 12.0–15.0)
MCH: 30.8 pg (ref 26.0–34.0)
MCHC: 33.2 g/dL (ref 30.0–36.0)
MCV: 92.8 fL (ref 80.0–100.0)
Platelets: 234 10*3/uL (ref 150–400)
RBC: 3.47 MIL/uL — ABNORMAL LOW (ref 3.87–5.11)
RDW: 15.7 % — ABNORMAL HIGH (ref 11.5–15.5)
WBC: 9.7 10*3/uL (ref 4.0–10.5)
nRBC: 0 % (ref 0.0–0.2)

## 2023-09-11 LAB — RENAL FUNCTION PANEL
Albumin: 2.5 g/dL — ABNORMAL LOW (ref 3.5–5.0)
Anion gap: 11 (ref 5–15)
BUN: 22 mg/dL (ref 8–23)
CO2: 26 mmol/L (ref 22–32)
Calcium: 8.4 mg/dL — ABNORMAL LOW (ref 8.9–10.3)
Chloride: 101 mmol/L (ref 98–111)
Creatinine, Ser: 0.83 mg/dL (ref 0.44–1.00)
GFR, Estimated: >60 mL/min (ref >60)
Glucose, Bld: 83 mg/dL (ref 70–99)
Phosphorus: 3.2 mg/dL (ref 2.5–4.6)
Potassium: 3.3 mmol/L — ABNORMAL LOW (ref 3.5–5.1)
Sodium: 138 mmol/L (ref 135–145)

## 2023-09-11 MED ORDER — MIRABEGRON ER 25 MG PO TB24
25.0000 mg | ORAL_TABLET | Freq: Every day | ORAL | Status: DC
  Administered 2023-09-11 – 2023-09-12 (×2): 25 mg via ORAL
  Filled 2023-09-11 (×2): qty 1

## 2023-09-11 MED ORDER — SERTRALINE HCL 50 MG PO TABS
100.0000 mg | ORAL_TABLET | Freq: Every day | ORAL | Status: DC
  Administered 2023-09-11 – 2023-09-12 (×2): 100 mg via ORAL
  Filled 2023-09-11 (×2): qty 2

## 2023-09-11 MED ORDER — PSYLLIUM 95 % PO PACK
1.0000 | PACK | Freq: Two times a day (BID) | ORAL | Status: DC
  Administered 2023-09-11 – 2023-09-12 (×3): 1 via ORAL
  Filled 2023-09-11 (×4): qty 1

## 2023-09-11 NOTE — Progress Notes (Addendum)
 PROGRESS NOTE    Cindy Graham   NWG:[id number] DOB: 1921/11/25  DOA: 09/07/2023 Date of Service: 09/11/23 which is hospital day 3  PCP: [facility], [facility] course / significant events:   HPI: 89 yo F presenting to [facility] ED from [facility] via EMS for evaluation of hematemesis.  04/18: Admit to ICU with upper gastrointestinal bleeding s/p emergent endoscopy requiring post-operative mechanical ventilatory support. EGD revealed very large cratered esophageal ulcer occupying lower esophagus with gigantic clot found. She remained intubated postprocedure,  04/19: No bleeding, BP stable. Hgb trending down (suspecting dilutional as all cell lines decreased).  GI, no plan for additional intervention. Extubated  04/20: care transferred to hospitalist service. Hgb am 7.5, giving 1 unit PRBC. Tolerating CLD, advance to FLD 04/21: doing well, per PT ok to go back to ALF, we are waiting for her facility to send someone to evaluate if they will take her back   Consultants:  Gastroenterology PCCU Palliative care   Procedures/Surgeries: 09/08/23: EGD      ASSESSMENT & PLAN:   Acute Blood Loss Anemia due to ... Acute Upper Gastrointestinal Bleed secondary to large cratered esophageal ulcer & erosive esophagitis s/p EGD  SIRS associated w/ upper GI bleed - resolved  Monitor for bleeding Follow CBC, HH No antiplatelet/anticoag High dose Protonix  IV 40 mg q8h for now, po PPI on discharge  GI - no plan for repeat intervention GI - ulcer probably will conitnue oozing blood, recommend transuse unit PRBC will order this  Advance diet slowly GI has s/o today   Leukocytosis suspect reactive in the setting of bleeding - resolved Monitor fever curve Trend WBC's & Procalcitonin Follow cultures as above D/c abx    #Chest Discomfort ~ RESOLVED #Mildly Elevated Troponin due to Demand Ischemia #Chronic HFpEF without acute exacerbation  PMHx: HTN, HLD -Echocardiogram  11/02/21: LVEF 70-75%, mild LVH, grade II diastolic dysfunction, RV systolic function mildly reduced, RV size mildly enlarged cardiac monitoring Maintain MAP >65 I&O Diuresis as needed / as BP and renal function permits       No concerns based on BMI: Body mass index is 23.74 kg/m.  Underweight - under 18  overweight - 25 to 29 obese - 30 or more Class 1 obesity: BMI of 30.0 to 34 Class 2 obesity: BMI of 35.0 to 39 Class 3 obesity: BMI of 40.0 to 49 Super Morbid Obesity: BMI 50-59 Super-super Morbid Obesity: BMI 60+ Significantly low or high BMI is associated with higher medical risk.  Weight management advised as adjunct to other disease management and risk reduction treatments    DVT prophylaxis: no Rx ppx d/t GIB IV fluids: no continuous IV fluids  Nutrition: advance slowly, FLD today  Central lines / other devices: none  Code Status: DNR. if her condition worsens post extubation, plan switch to comfort.  ACP documentation reviewed: DNR on file in [facility]  TOC needs: TBD Medical barriers to dispo: advancing diet a  dmonitoring Hgb, continuing IV PPI. Expected medical readiness for discharge possibly tomorrow .              Subjective / Brief ROS:  Patient reports no concerns this morning Denies CP/SOB.  Pain controlled.  Denies new weakness.  Tolerating diet.  Reports no concerns w/ urination/defecation.    Family Communication: called son  09/11/23 3:51 PM informed him we are waiting to see if ALF will accept her back but she does nto need SNF rehab and doing well otherwise  Objective Findings:  Vitals:   09/11/23 0400 09/11/23 0614 09/11/23 0701 09/11/23 0907  BP: (!) 141/59 (!) 128/52  (!) 123/52  Pulse: 91 84  83  Resp: 17 (!) 23  16  Temp:  98.1 F (36.7 C)  97.6 F (36.4 C)  TempSrc:  Oral    SpO2: 96% 95%  98%  Weight:   61.1 kg   Height:        Intake/Output Summary (Last 24 hours) at 09/11/2023 1551 Last data filed at 09/11/2023  1027 Gross per 24 hour  Intake 590.83 ml  Output 1950 ml  Net -1359.17 ml   Filed Weights   09/07/23 1938 09/11/23 0701  Weight: 60.8 kg 61.1 kg      Physical Exam Constitutional:      General: She is not in acute distress. Cardiovascular:     Rate and Rhythm: Normal rate and regular rhythm.     Heart sounds: Murmur heard.  Pulmonary:     Effort: Pulmonary effort is normal.     Breath sounds: Normal breath sounds.  Abdominal:     General: Abdomen is flat.     Palpations: Abdomen is soft.  Musculoskeletal:     Right lower leg: No edema.     Left lower leg: No edema.  Skin:    General: Skin is warm and dry.  Neurological:     Mental Status: She is alert. Mental status is at baseline.  Psychiatric:        Mood and Affect: Mood normal.        Behavior: Behavior normal.          Scheduled Medications:   Chlorhexidine  Gluconate Cloth  6 each Topical Daily   enalapril   20 mg Oral Daily   mirabegron  ER  25 mg Oral Daily   pantoprazole  (PROTONIX ) IV  40 mg Intravenous Q12H   psyllium  1 packet Oral BID   sertraline   100 mg Oral Daily   verapamil   240 mg Oral Daily    Continuous Infusions:    PRN Medications:  docusate sodium , fentaNYL  (SUBLIMAZE ) injection, ondansetron  (ZOFRAN ) IV, mouth rinse, mouth rinse, polyethylene glycol  Antimicrobials from admission:  Anti-infectives (From admission, onward)    Start     Dose/Rate Route Frequency Ordered Stop   09/08/23 1045  cefTRIAXone  (ROCEPHIN ) 1 g in sodium chloride  0.9 % 100 mL IVPB  Status:  Discontinued        1 g 200 mL/hr over 30 Minutes Intravenous Every 24 hours 09/08/23 0952 09/10/23 1649           Data Reviewed:  I have personally reviewed the following...  CBC: Recent Labs  Lab 09/07/23 1953 09/07/23 2233 09/08/23 0422 09/08/23 0811 09/09/23 0407 09/09/23 1051 09/10/23 0407 09/10/23 1614 09/10/23 2258 09/11/23 0304  WBC 14.8* 14.7* 13.0* 14.7* 11.5*  --  7.9  --   --  9.7   NEUTROABS 12.4* 12.3*  --   --   --   --   --   --   --   --   HGB 11.1* 10.4* 9.0* 10.3* 8.1* 7.9* 7.5* 7.7* 10.5* 10.7*  HCT 34.1* 32.4* 27.7* 31.5* 24.5* 24.1* 23.6* 24.0* 31.6* 32.2*  MCV 93.4 95.3 91.4 94.0 93.5  --  95.9  --   --  92.8  PLT 236 230 231 256 194  --  192  --   --  234   Basic Metabolic Panel: Recent Labs  Lab 09/07/23 1953 09/08/23 0422  09/09/23 0407 09/10/23 0407 09/11/23 0304  NA 131* 131* 135 139 138  K 4.4 4.1 3.8 3.5 3.3*  CL 98 98 103 102 101  CO2 24 25 27 27 26   GLUCOSE 163* 156* 103* 85 83  BUN 41* 48* 38* 27* 22  CREATININE 0.73 0.76 0.87 0.68 0.83  CALCIUM  8.9 8.5* 8.4* 8.3* 8.4*  MG  --  1.7  --   --   --   PHOS  --  3.4 3.4 3.0 3.2   GFR: Estimated Creatinine Clearance: 29.8 mL/min (by C-G formula based on SCr of 0.83 mg/dL). Liver Function Tests: Recent Labs  Lab 09/07/23 1953 09/09/23 0407 09/10/23 0407 09/11/23 0304  AST 27  --   --   --   ALT 15  --   --   --   ALKPHOS 59  --   --   --   BILITOT 0.9  --   --   --   PROT 7.0  --   --   --   ALBUMIN  3.2* 2.5* 2.4* 2.5*   No results for input(s): "LIPASE", "AMYLASE" in the last 168 hours. No results for input(s): "AMMONIA" in the last 168 hours. Coagulation Profile: Recent Labs  Lab 09/07/23 2023  INR 1.1   Cardiac Enzymes: No results for input(s): "CKTOTAL", "CKMB", "CKMBINDEX", "TROPONINI" in the last 168 hours. BNP (last 3 results) No results for input(s): "PROBNP" in the last 8760 hours. HbA1C: No results for input(s): "HGBA1C" in the last 72 hours. CBG: Recent Labs  Lab 09/10/23 2037 09/10/23 2346 09/11/23 0337 09/11/23 0925 09/11/23 1336  GLUCAP 94 91 86 97 168*   Lipid Profile: Recent Labs    09/09/23 0407  TRIG 96   Thyroid  Function Tests: No results for input(s): "TSH", "T4TOTAL", "FREET4", "T3FREE", "THYROIDAB" in the last 72 hours. Anemia Panel: No results for input(s): "VITAMINB12", "FOLATE", "FERRITIN", "TIBC", "IRON", "RETICCTPCT" in the last  72 hours. Most Recent Urinalysis On File:     Component Value Date/Time   COLORURINE AMBER (A) 11/01/2021 1127   APPEARANCEUR HAZY (A) 11/01/2021 1127   APPEARANCEUR Cloudy (A) 12/31/2020 1355   LABSPEC 1.019 11/01/2021 1127   LABSPEC 1.018 06/01/2013 0933   PHURINE 5.0 11/01/2021 1127   GLUCOSEU NEGATIVE 11/01/2021 1127   GLUCOSEU NEGATIVE 06/17/2014 1123   HGBUR SMALL (A) 11/01/2021 1127   BILIRUBINUR NEGATIVE 11/01/2021 1127   BILIRUBINUR Negative 12/31/2020 1355   BILIRUBINUR Negative 06/01/2013 0933   KETONESUR NEGATIVE 11/01/2021 1127   PROTEINUR 30 (A) 11/01/2021 1127   UROBILINOGEN 0.2 11/28/2017 1207   UROBILINOGEN 0.2 06/17/2014 1123   NITRITE NEGATIVE 11/01/2021 1127   LEUKOCYTESUR LARGE (A) 11/01/2021 1127   LEUKOCYTESUR 2+ 06/01/2013 0933   Sepsis Labs: @LABRCNTIP (procalcitonin:4,lacticidven:4) Microbiology: Recent Results (from the past 240 hours)  MRSA Next Gen by PCR, Nasal     Status: None   Collection Time: 09/08/23  3:07 AM   Specimen: Nasal Mucosa; Nasal Swab  Result Value Ref Range Status   MRSA by PCR Next Gen NOT DETECTED NOT DETECTED Final    Comment: (NOTE) The GeneXpert MRSA Assay (FDA approved for NASAL specimens only), is one component of a comprehensive MRSA colonization surveillance program. It is not intended to diagnose MRSA infection nor to guide or monitor treatment for MRSA infections. Test performance is not FDA approved in patients less than 85 years old. Performed at [facility], [address], [place], [place] [address]       Radiology Studies last  3 days: DG Chest Port 1 View Result Date: 09/08/2023 CLINICAL DATA:  Status post intubation EXAM: PORTABLE CHEST 1 VIEW COMPARISON:  Film from the previous day. FINDINGS: Endotracheal tube is noted just above the aortic arch 5.6 cm from the carina. Cardiac shadow is within normal limits. Lungs are clear. No bony abnormality is seen. IMPRESSION: Endotracheal tube in  satisfactory position. Electronically Signed   By: Violeta Grey M.D.   On: 09/08/2023 03:01   CT Soft Tissue Neck W Contrast Result Date: 09/08/2023 CLINICAL DATA:  Foreign body ingestion with possible damage to posterior oropharynx or anywhere along the esophagus. Vomiting entirely blood EXAM: CT NECK WITH CONTRAST TECHNIQUE: Multidetector CT imaging of the neck was performed using the standard protocol following the bolus administration of intravenous contrast. RADIATION DOSE REDUCTION: This exam was performed according to the departmental dose-optimization program which includes automated exposure control, adjustment of the mA and/or kV according to patient size and/or use of iterative reconstruction technique. CONTRAST:  75mL OMNIPAQUE  IOHEXOL  300 MG/ML  SOLN COMPARISON:  None Available. FINDINGS: Pharynx and larynx: Normal. No mass or swelling. No radiopaque foreign body identified. Salivary glands: No inflammation, mass, or stone. Thyroid : Subcentimeter thyroid  nodules which do not require further imaging follow-up (ref: J Am Coll Radiol. 2015 Feb;12(2): 143-50). Lymph nodes: None enlarged or abnormal density. Vascular: Limited evaluation due to non arterial contrast bolus timing. The major arteries appear grossly patent in the neck. Limited intracranial: Negative. Visualized orbits: Negative. Mastoids and visualized paranasal sinuses: Clear. Skeleton: Remote craniocervical fractures. Upper chest: Visualized lung apices are clear. Small right pleural effusion. Aortic atherosclerosis. IMPRESSION: 1. Distended fluid-filled esophagus with layering hyperdensity, suggestive of hemorrhage. A CTA could better assess for active extravasation if clinically warranted. 2. No radiopaque foreign body or significant edema identified. 3. Small right pleural effusion. Electronically Signed   By: Stevenson Elbe M.D.   On: 09/08/2023 00:48   CT CHEST W CONTRAST Result Date: 09/07/2023 CLINICAL DATA:  Foreign body  ingestion with possible damage to posterior oropharynx or anywhere along the esophagus. Vomiting entirely blood EXAM: CT CHEST WITH CONTRAST TECHNIQUE: Multidetector CT imaging of the chest was performed during intravenous contrast administration. RADIATION DOSE REDUCTION: This exam was performed according to the departmental dose-optimization program which includes automated exposure control, adjustment of the mA and/or kV according to patient size and/or use of iterative reconstruction technique. CONTRAST:  75mL OMNIPAQUE  IOHEXOL  300 MG/ML  SOLN COMPARISON:  11/03/2017 FINDINGS: Cardiovascular: Heart is normal size. Small pericardial effusion. Diffuse coronary artery and aortic atherosclerosis. No aneurysm. Mediastinum/Nodes: No mediastinal, hilar, or axillary adenopathy. Trachea and thyroid  unremarkable. Moderate-sized hiatal hernia. Esophagus is fluid-filled. Focal area of high density posteriorly along the upper thoracic esophagus near the thoracic inlet. Cannot exclude active extravasation of contrast. Lungs/Pleura: Small bilateral pleural effusions. Bibasilar scarring. No additional confluent airspace opacity. Upper Abdomen: No acute findings Musculoskeletal: Chest wall soft tissues are unremarkable. No acute bony abnormality. IMPRESSION: Distended, fluid-filled esophagus. Area of high density noted along the posterior wall in the upper thoracic esophagus near the thoracic inlet. Cannot exclude active extravasation of contrast. Small bilateral pleural effusions.  Bibasilar scarring. Coronary artery disease. Aortic Atherosclerosis (ICD10-I70.0). Electronically Signed   By: Janeece Mechanic M.D.   On: 09/07/2023 21:17   DG Chest Portable 1 View Result Date: 09/07/2023 CLINICAL DATA:  Vomiting blood EXAM: PORTABLE CHEST 1 VIEW COMPARISON:  None Available. FINDINGS: Heart and mediastinal contours within normal limits. Increased markings in the lung bases likely reflect atelectasis or  scarring. No confluent  opacities or effusions. No acute bony abnormality. IMPRESSION: Bibasilar atelectasis or scarring. Electronically Signed   By: Janeece Mechanic M.D.   On: 09/07/2023 21:14         Melodi Sprung, DO [facility] 09/11/2023, 3:51 PM    Dictation software may have been used to generate the above note. Typos may occur and escape review in typed/dictated notes. Please contact Dr Authur Leghorn directly for clarity if needed.  Staff may message me via secure chat in Epic  but this may not receive an immediate response,  please page me for urgent matters!  If 7PM-7AM, please contact night coverage [email or web address]

## 2023-09-11 NOTE — Evaluation (Signed)
 Occupational Therapy Evaluation Patient Details Name: Cindy Graham MRN: [id number] DOB: 06-19-1921 Today's Date: 09/11/2023   History of Present Illness   89 y/o female presented to ED on 09/07/23 for bloody emesis. Admitted for acute upper GI bleed with severe large cratered esophageal ulcer. PMH: HTN, anxiety, hyperlipidemia, melanoma, COPD     Clinical Impressions Patient presenting with decreased Ind in self care,balance, functional mobility/transfers, endurance, and safety awareness.  Patient reports living in facility and needing assistance for LB self care from staff. Pt does not ambulate but does transfer herself from wheelchair and propels self once in chair. Pt able to transfer to commode but needs assistance for clothing management from staff. Patient currently functioning close to baseline with min A for bed mobility and CGA for simulated wheelchair transfer. Pt needing increased time to complete tasks and fatigues quickly. Patient will benefit from acute OT to increase overall independence in the areas of ADLs, functional mobility, and safety awareness in order to safely discharge.     If plan is discharge home, recommend the following:   A little help with walking and/or transfers;A little help with bathing/dressing/bathroom;Assistance with cooking/housework;Assist for transportation;Help with stairs or ramp for entrance     Functional Status Assessment   Patient has had a recent decline in their functional status and/or demonstrates limited ability to make significant improvements in function in a reasonable and predictable amount of time     Equipment Recommendations   None recommended by OT      Precautions/Restrictions   Precautions Precautions: Fall Recall of Precautions/Restrictions: Intact     Mobility Bed Mobility Overal bed mobility: Needs Assistance Bed Mobility: Supine to Sit, Sit to Supine     Supine to sit: Min assist Sit to supine: Min  assist        Transfers Overall transfer level: Needs assistance Equipment used: None Transfers: Bed to chair/wheelchair/BSC       Step pivot transfers: Contact guard assist            Balance Overall balance assessment: Needs assistance, History of Falls Sitting-balance support: No upper extremity supported, Feet supported Sitting balance-Leahy Scale: Fair     Standing balance support: Single extremity supported, During functional activity Standing balance-Leahy Scale: Fair                             ADL either performed or assessed with clinical judgement   ADL                                         General ADL Comments: close to baseline     Vision Patient Visual Report: No change from baseline              Pertinent Vitals/Pain Pain Assessment Pain Assessment: No/denies pain     Extremity/Trunk Assessment Upper Extremity Assessment Upper Extremity Assessment: Generalized weakness   Lower Extremity Assessment Lower Extremity Assessment: Generalized weakness   Cervical / Trunk Assessment Cervical / Trunk Assessment: Kyphotic   Communication Communication Communication: Impaired Factors Affecting Communication: Hearing impaired   Cognition Arousal: Alert Behavior During Therapy: WFL for tasks assessed/performed                                 Following commands: Intact  Home Living Family/patient expects to be discharged to:: Assisted living                             Home Equipment: Wheelchair [employer] trader (4 wheels)   Additional Comments: LTC resident at [employer]      Prior Functioning/Environment Prior Level of Function : Needs assist;History of Falls (last six months)             Mobility Comments: reports modI for stand pivot t/f to w/c, however later in session reports facility assists with standing ADLs Comments: requires assistance  for lower body dressing from staff. She transfers self to toilet but needs assistance for clothing management    OT Problem List: Decreased strength;Decreased activity tolerance;Decreased safety awareness;Impaired balance (sitting and/or standing);Decreased knowledge of use of DME or AE   OT Treatment/Interventions: Self-care/ADL training;Therapeutic exercise;Therapeutic activities;DME and/or AE instruction;Patient/family education;Balance training;Energy conservation      OT Goals(Current goals can be found in the care plan section)   Acute Rehab OT Goals Patient Stated Goal: to feel better OT Goal Formulation: With patient Time For Goal Achievement: 09/25/23 Potential to Achieve Goals: Fair ADL Goals Pt Will Perform Grooming: sitting;with supervision;with set-up Pt Will Transfer to Toilet: with supervision;squat pivot transfer Pt Will Perform Toileting - Clothing Manipulation and hygiene: with min assist;sit to/from stand   OT Frequency:  Min 1X/week    Co-evaluation   Reason for Co-Treatment: For patient/therapist safety;To address functional/ADL transfers PT goals addressed during session: Balance;Mobility/safety with mobility        AM-PAC OT "6 Clicks" Daily Activity     Outcome Measure Help from another person eating meals?: None Help from another person taking care of personal grooming?: None Help from another person toileting, which includes using toliet, bedpan, or urinal?: A Lot Help from another person bathing (including washing, rinsing, drying)?: A Lot Help from another person to put on and taking off regular upper body clothing?: A Little Help from another person to put on and taking off regular lower body clothing?: A Lot 6 Click Score: 17   End of Session Nurse Communication: Mobility status  Activity Tolerance: Patient limited by fatigue;Patient tolerated treatment well Patient left: in bed;with call bell/phone within reach;with bed alarm set;with  nursing/sitter in room  OT Visit Diagnosis: Unsteadiness on feet (R26.81);Repeated falls (R29.6);Muscle weakness (generalized) (M62.81)                Time: 1610-9604 OT Time Calculation (min): 17 min Charges:  OT General Charges $OT Visit: 1 Visit OT Evaluation $OT Eval Low Complexity: 1 Low  George Kinder, MS, OTR/L , CBIS ascom [phone]  09/11/23, 1:30 PM

## 2023-09-11 NOTE — Evaluation (Signed)
 Physical Therapy Evaluation Patient Details Name: Cindy Graham MRN: [id number] DOB: 1921/10/17 Today's Date: 09/11/2023  History of Present Illness  89 y/o female presented to ED on 09/07/23 for bloody emesis. Admitted for acute upper GI bleed with severe large cratered esophageal ulcer. PMH: HTN, anxiety, hyperlipidemia, melanoma, COPD  Clinical Impression  Patient admitted with the above. PTA, patient lives at [facility] and requires assistance for ADLs and stand pivot transfers in/out of w/c. Patient presents with weakness and decreased activity tolerance. Requires minA for bed mobility and CGA for stand pivot transfer to chair. Patient likely close to functional baseline. Patient will benefit from skilled PT services during acute stay to address listed deficits.     If plan is discharge home, recommend the following: A little help with walking and/or transfers;A little help with bathing/dressing/bathroom;Assistance with cooking/housework;Direct supervision/assist for financial management;Direct supervision/assist for medications management;Assist for transportation   Can travel by private vehicle        Equipment Recommendations None recommended by PT  Recommendations for Other Services       Functional Status Assessment Patient has had a recent decline in their functional status and demonstrates the ability to make significant improvements in function in a reasonable and predictable amount of time.     Precautions / Restrictions Precautions Precautions: Fall Recall of Precautions/Restrictions: Intact Restrictions Weight Bearing Restrictions Per Provider Order: No      Mobility  Bed Mobility Overal bed mobility: Needs Assistance Bed Mobility: Supine to Sit, Sit to Supine     Supine to sit: Min assist Sit to supine: Min assist        Transfers Overall transfer level: Needs assistance Equipment used: None Transfers: Bed to chair/wheelchair/BSC     Step  pivot transfers: Contact guard assist            Ambulation/Gait                  Stairs            Wheelchair Mobility     Tilt Bed    Modified Rankin (Stroke Patients Only)       Balance Overall balance assessment: Needs assistance, History of Falls Sitting-balance support: No upper extremity supported, Feet supported Sitting balance-Leahy Scale: Fair     Standing balance support: Single extremity supported, During functional activity Standing balance-Leahy Scale: Fair                               Pertinent Vitals/Pain Pain Assessment Pain Assessment: No/denies pain    Home Living Family/patient expects to be discharged to:: Assisted living                 Home Equipment: Scientist, research (medical) (4 wheels) Additional Comments: LTC resident at [facility]    Prior Function Prior Level of Function : Needs assist;History of Falls (last six months)             Mobility Comments: reports modI for stand pivot t/f to w/c, however later in session reports facility assists with standing ADLs Comments: requires assistance for lower body dressing     Extremity/Trunk Assessment   Upper Extremity Assessment Upper Extremity Assessment: Defer to OT evaluation    Lower Extremity Assessment Lower Extremity Assessment: Generalized weakness    Cervical / Trunk Assessment Cervical / Trunk Assessment: Kyphotic  Communication   Communication Communication: Impaired Factors Affecting Communication: Hearing impaired  Cognition Arousal: Alert Behavior During Therapy: WFL for tasks assessed/performed   PT - Cognitive impairments: No apparent impairments                         Following commands: Intact       Cueing       General Comments      Exercises     Assessment/Plan    PT Assessment Patient needs continued PT services  PT Problem List Decreased strength;Decreased activity  tolerance;Decreased balance;Decreased mobility;Decreased knowledge of precautions       PT Treatment Interventions DME instruction;Functional mobility training;Therapeutic activities;Balance training;Therapeutic exercise;Patient/family education    PT Goals (Current goals can be found in the Care Plan section)  Acute Rehab PT Goals Patient Stated Goal: to return home PT Goal Formulation: With patient Time For Goal Achievement: 09/25/23 Potential to Achieve Goals: Good    Frequency Min 2X/week     Co-evaluation PT/OT/SLP Co-Evaluation/Treatment: Yes Reason for Co-Treatment: For patient/therapist safety;To address functional/ADL transfers PT goals addressed during session: Balance;Mobility/safety with mobility         AM-PAC PT "6 Clicks" Mobility  Outcome Measure Help needed turning from your back to your side while in a flat bed without using bedrails?: A Little Help needed moving from lying on your back to sitting on the side of a flat bed without using bedrails?: A Little Help needed moving to and from a bed to a chair (including a wheelchair)?: A Little Help needed standing up from a chair using your arms (e.g., wheelchair or bedside chair)?: A Little Help needed to walk in hospital room?: A Lot Help needed climbing 3-5 steps with a railing? : Total 6 Click Score: 15    End of Session   Activity Tolerance: Patient tolerated treatment well Patient left: in bed;with call bell/phone within reach;with bed alarm set;with nursing/sitter in room Nurse Communication: Mobility status PT Visit Diagnosis: Muscle weakness (generalized) (M62.81);Unsteadiness on feet (R26.81)    Time: 8413-2440 PT Time Calculation (min) (ACUTE ONLY): 17 min   Charges:   PT Evaluation $PT Eval Moderate Complexity: 1 Mod   PT General Charges $$ ACUTE PT VISIT: 1 Visit         [name], PT, DPT Physical Therapist - [facility]  [facility]   [name] A  [name] 09/11/2023, 12:43 PM

## 2023-09-11 NOTE — Plan of Care (Signed)
  Problem: Education: Goal: Knowledge of General Education information will improve Description: Including pain rating scale, medication(s)/side effects and non-pharmacologic comfort measures Outcome: Progressing   Problem: Coping: Goal: Level of anxiety will decrease Outcome: Progressing   Problem: Elimination: Goal: Will not experience complications related to bowel motility Outcome: Progressing Goal: Will not experience complications related to urinary retention Outcome: Progressing   Problem: Pain Managment: Goal: General experience of comfort will improve and/or be controlled Outcome: Progressing   Problem: Skin Integrity: Goal: Risk for impaired skin integrity will decrease Outcome: Progressing   Problem: Activity: Goal: Ability to tolerate increased activity will improve Outcome: Progressing   Problem: Respiratory: Goal: Ability to maintain a clear airway and adequate ventilation will improve Outcome: Progressing   Problem: Role Relationship: Goal: Method of communication will improve Outcome: Progressing

## 2023-09-11 NOTE — Progress Notes (Signed)
 Palliative Care Progress Note, Assessment & Plan   Patient Name: Cindy Graham       Date: 09/11/2023 DOB: 11-28-1921  Age: 89 y.o. MRN#: [id number] Attending Physician: Melodi Sprung, DO Primary Care Physician: Cole Daubs, [facility] Admit Date: 09/07/2023  Subjective: Reports not feeling well today. She reports that after getting up this morning with staff, she has been extremely weak and tired. Slept well last night. Ate some broth for breakfast. She denies pain, N/V or SHOB.   HPI: 89 y.o. female  with past medical history of asthma, hyperlipidemia, hypertension, GERD, melanoma, anxiety and arthritis admitted from [facility] on 09/07/2023 with multiple episodes of large hematemesis requiring intubation for airway protection.   S/p EGD 4/18 revealing very large cratered esophageal ulcer with gigantic clot treated during EGD and placed on high-dose IV Protonix  Remained intubated postprocedure   Palliative medicine was consulted for assisting with goals of care conversations.    Summary of counseling/coordination of care: Extensive chart review completed prior to meeting patient including labs, vital signs, imaging, progress notes, orders, and available advanced directive documents from current and previous encounters.   After reviewing the patient's chart I visited with patient at the bedside.  Elderly, ill-appearing female resting quietly. She is alert, pleasant and in no distress. She appears tired. Denies signs of GI bleeding.   Hemoglobin is stable this morning with no significant changes from yesterday.     Latest Ref Rng & Units 09/11/2023    3:04 AM 09/10/2023   10:58 PM 09/10/2023    4:14 PM  CBC  WBC 4.0 - 10.5 K/uL 9.7     Hemoglobin 12.0 - 15.0 g/dL 08.6  57.8  7.7    Hematocrit 36.0 - 46.0 % 32.2  31.6  24.0   Platelets 150 - 400 K/uL 234      Spoke with Cindy Graham, daughter, and Cindy Graham, son, via phone, and provided update. Both are adamant that they want their mother to return to ALF from where she resides, but understand that if she were to decline, their mother may need a higher level of care. They agree that she needs time to regain strength and must be assessed daily for changes. Cindy Graham and Cindy Graham are appreciative of call and information.   Therapeutic silence and active listening provided for patient, son and daughter to share her thoughts and emotions regarding current medical situation.  Emotional support provided.  Physical Exam Vitals reviewed.  Constitutional:      Appearance: She is ill-appearing.     Comments: Weakness  Pulmonary:     Effort: Pulmonary effort is normal. No respiratory distress.  Musculoskeletal:     Right lower leg: No edema.     Left lower leg: No edema.  Skin:    General: Skin is warm and dry.  Neurological:     Mental Status: She is alert and oriented to person, place, and time.  Psychiatric:        Mood and Affect: Mood normal.        Behavior: Behavior normal.   Recommendations/Plan: Allow time for outcomes PMT to continue to follow for ongoing needs and support        Total Time  50 minutes   Time spent includes: Detailed review of medical records (labs, imaging, vital signs), medically appropriate exam (mental status, respiratory, cardiac, skin), discussed with treatment team, counseling and educating patient, family and staff, documenting clinical information, medication management and coordination of care.     Ina Manas, Joyice Nodal- [facility] Palliative Medicine Team  09/11/2023 9:12 AM  Office [phone]  Pager [phone]

## 2023-09-11 NOTE — Progress Notes (Signed)
 Patient transferred to room 228 via bed. Patients daughter, Hilary Lovett, was notified of transfer, updated and all questions answered.

## 2023-09-11 NOTE — Progress Notes (Signed)
    Marnee Sink, MD [facility]   [address] [place], [place] [address] Phone: [phone] Fax : [phone]   Subjective: The patient has no complaints today.  The hemoglobin is stable.  She denies any sign of GI bleeding.  The patient is wondering when she can go home.   Objective: Vital signs in last 24 hours: Vitals:   09/11/23 0200 09/11/23 0300 09/11/23 0400 09/11/23 0614  BP: (!) 149/52  (!) 141/59 (!) 128/52  Pulse: 84 80 91 84  Resp: 16 18 17  (!) 23  Temp: 98.2 F (36.8 C)   98.1 F (36.7 C)  TempSrc: Axillary   Oral  SpO2: 95% 94% 96% 95%  Weight:      Height:       Weight change:   Intake/Output Summary (Last 24 hours) at 09/11/2023 0720 Last data filed at 09/11/2023 6962 Gross per 24 hour  Intake 820.7 ml  Output 2400 ml  Net -1579.3 ml     Exam: Heart:: Regular rate and rhythm or without murmur or extra heart sounds Lungs: normal and clear to auscultation and percussion Abdomen: soft, nontender, normal bowel sounds   Lab Results: @LABTEST2 @ Micro Results: Recent Results (from the past 240 hours)  MRSA Next Gen by PCR, Nasal     Status: None   Collection Time: 09/08/23  3:07 AM   Specimen: Nasal Mucosa; Nasal Swab  Result Value Ref Range Status   MRSA by PCR Next Gen NOT DETECTED NOT DETECTED Final    Comment: (NOTE) The GeneXpert MRSA Assay (FDA approved for NASAL specimens only), is one component of a comprehensive MRSA colonization surveillance program. It is not intended to diagnose MRSA infection nor to guide or monitor treatment for MRSA infections. Test performance is not FDA approved in patients less than 88 years old. Performed at [facility], [address], [name], [place] [address]    Studies/Results: No results found. Medications: I have reviewed the patient's current medications. Scheduled Meds:  Chlorhexidine  Gluconate Cloth  6 each Topical Daily   enalapril   20 mg Oral Daily   pantoprazole  (PROTONIX ) IV   40 mg Intravenous Q12H   verapamil   240 mg Oral Daily   Continuous Infusions: PRN Meds:.docusate sodium , fentaNYL  (SUBLIMAZE ) injection, ondansetron  (ZOFRAN ) IV, mouth rinse, mouth rinse, polyethylene glycol   Assessment: Principal Problem:   Acute upper GI bleeding Active Problems:   Hematemesis with nausea    Plan: This patient had an upper GI bleed with a possible mass in the esophagus that was not biopsied.  The patient has been stable and is wanting to know when she can go home.  There is nothing further to do from a GI point of view due to the fact that even if biopsied and proven malignancy of the esophagus at the patient's age of 89 it is unlikely she would be a candidate for any reasonable treatment options.   I will sign off.  Please call if any further GI concerns or questions.  We would like to thank you for the opportunity to participate in the care of CHRISTEAN SILVESTRI.    LOS: 3 days   Marnee Sink, MD.FACG 09/11/2023, 7:20 AM Pager [phone] 7am-5pm  Check AMION for 5pm -7am coverage and on weekends

## 2023-09-12 DIAGNOSIS — K922 Gastrointestinal hemorrhage, unspecified: Secondary | ICD-10-CM | POA: Diagnosis not present

## 2023-09-12 LAB — RENAL FUNCTION PANEL
Albumin: 2.6 g/dL — ABNORMAL LOW (ref 3.5–5.0)
Anion gap: 9 (ref 5–15)
BUN: 19 mg/dL (ref 8–23)
CO2: 27 mmol/L (ref 22–32)
Calcium: 8.5 mg/dL — ABNORMAL LOW (ref 8.9–10.3)
Chloride: 102 mmol/L (ref 98–111)
Creatinine, Ser: 0.92 mg/dL (ref 0.44–1.00)
GFR, Estimated: 56 mL/min — ABNORMAL LOW (ref >60)
Glucose, Bld: 106 mg/dL — ABNORMAL HIGH (ref 70–99)
Phosphorus: 2.9 mg/dL (ref 2.5–4.6)
Potassium: 3.8 mmol/L (ref 3.5–5.1)
Sodium: 138 mmol/L (ref 135–145)

## 2023-09-12 LAB — CBC
HCT: 29.9 % — ABNORMAL LOW (ref 36.0–46.0)
Hemoglobin: 9.9 g/dL — ABNORMAL LOW (ref 12.0–15.0)
MCH: 30.5 pg (ref 26.0–34.0)
MCHC: 33.1 g/dL (ref 30.0–36.0)
MCV: 92 fL (ref 80.0–100.0)
Platelets: 225 10*3/uL (ref 150–400)
RBC: 3.25 MIL/uL — ABNORMAL LOW (ref 3.87–5.11)
RDW: 15.5 % (ref 11.5–15.5)
WBC: 7.3 10*3/uL (ref 4.0–10.5)
nRBC: 0 % (ref 0.0–0.2)

## 2023-09-12 LAB — GLUCOSE, CAPILLARY: Glucose-Capillary: 104 mg/dL — ABNORMAL HIGH (ref 70–99)

## 2023-09-12 MED ORDER — VERAPAMIL HCL ER 180 MG PO CP24: 180.0000 mg | ORAL_CAPSULE | Freq: Every day | ORAL | Status: DC

## 2023-09-12 MED ORDER — PANTOPRAZOLE SODIUM 40 MG PO TBEC
40.0000 mg | DELAYED_RELEASE_TABLET | Freq: Two times a day (BID) | ORAL | Status: DC
Start: 2023-09-12 — End: 2023-12-29

## 2023-09-12 MED ORDER — ORAL CARE MOUTH RINSE: 15.0000 mL | OROMUCOSAL | Status: DC | PRN

## 2023-09-12 NOTE — Plan of Care (Signed)
                                                     Palliative Care Progress Note   Patient Name: Cindy Graham       Date: 09/12/2023 DOB: [birth year]  Age: 89 y.o. MRN#: [id number] Attending Physician: Melodi Sprung, DO Primary Care Physician: Cole Daubs, [facility] Admit Date: 09/07/2023  Extensive chart review completed including labs, vital signs, imaging, progress notes, orders, and available advanced directive documents from current and previous encounters.   No acute palliative needs identified today.  Awaiting response from ALF to see if patient can return to facility. TOC following closely.   PMT will continue to follow and support patient throughout her hospitalization.  Thank you for allowing the Palliative Medicine Team to assist in the care of Cindy Graham.  Judeen Nose L. Rebbeca Campi, DNP, FNP-BC Palliative Medicine Team  No charge

## 2023-09-12 NOTE — Discharge Summary (Addendum)
 Physician Discharge Summary   Patient: Cindy Graham MRN: [id number]  DOB: [birth year]   Admit:     Date of Admission: 09/07/2023 Admitted from: assisted living    Discharge: Date of discharge: 09/12/23 Disposition: Assisted living Condition at discharge: fair  CODE STATUS: DNR     Discharge Physician: Melodi Sprung, DO [facility]     PCP: [facility]  Recommendations for Outpatient Follow-up:  Follow up with PCP [facility] in 1 weeks Please obtain labs/tests: CBC tomorrow and every 2-3 days following / at PCP discretion Please follow up on the following pending results: mpme PCP AND OTHER OUTPATIENT PROVIDERS: SEE BELOW FOR SPECIFIC DISCHARGE INSTRUCTIONS PRINTED FOR PATIENT IN ADDITION TO GENERIC AVS PATIENT INFO    Discharge Instructions     Diet: regular diet as tolerated, if possible soft foods for few days    Complete by: As directed    Discharge instructions   Complete by: As directed    Please monitor closely for bleeding. Expect black/tarry stool since ulcer will likely continue to ooze and be slow to heal. Please monitor CBC every 2-3 days and refer for blood transfusion as needed.   Increase activity slowly   Complete by: As directed    No wound care   Complete by: As directed          Discharge Diagnoses: Principal Problem:   Acute upper GI bleeding Active Problems:   Hematemesis with nausea      Hospital course / significant events:   HPI: 89 yo F presenting to [facility] ED from [facility] via EMS for evaluation of hematemesis.  04/18: Admit to ICU with upper gastrointestinal bleeding s/p emergent endoscopy requiring post-operative mechanical ventilatory support. EGD revealed very large cratered esophageal ulcer occupying lower esophagus with gigantic clot found. She remained intubated postprocedure,  04/19: No bleeding, BP stable. Hgb trending down (suspecting dilutional as all cell lines  decreased).  GI, no plan for additional intervention. Extubated  04/20: care transferred to hospitalist service. Hgb am 7.5, giving 1 unit PRBC. Tolerating CLD, advance to FLD 04/21: doing well, per PT ok to go back to ALF, we are waiting for her facility to send someone to evaluate if they will take her back  04/22: facility can take her back, stable for discharge. Continuing to have some black stool but HH/VS stable, expect ulcer to ooze per GI so stool may have this character however will watch CBC closely  Consultants:  Gastroenterology PCCU Palliative care   Procedures/Surgeries: 09/08/23: EGD      ASSESSMENT & PLAN:   Acute Blood Loss Anemia due to ... Acute Upper Gastrointestinal Bleed secondary to large cratered esophageal ulcer & erosive esophagitis s/p EGD  SIRS associated w/ upper GI bleed - resolved  Monitor for bleeding Follow CBC, HH No antiplatelet/anticoag Protonix  40 mg po bid on discharge  GI - no plan for repeat intervention GI - ulcer probably will conitnue oozing blood, recommend transfuse PRBC as needed Advance diet    Leukocytosis suspect reactive in the setting of bleeding - resolved Monitor CBC   #Chest Discomfort ~ RESOLVED #Mildly Elevated Troponin due to Demand Ischemia #Chronic HFpEF without acute exacerbation  PMHx: HTN, HLD -Echocardiogram 11/02/21: LVEF 70-75%, mild LVH, grade II diastolic dysfunction, RV systolic function mildly reduced, RV size mildly enlarged Reduced some BP meds as belwo see med rec      No concerns based on BMI: Body mass index is 23.74 kg/m.  Underweight - under 18  overweight - 25 to 29 obese - 30 or more Class 1 obesity: BMI of 30.0 to 34 Class 2 obesity: BMI of 35.0 to 39 Class 3 obesity: BMI of 40.0 to 49 Super Morbid Obesity: BMI 50-59 Super-super Morbid Obesity: BMI 60+ Significantly low or high BMI is associated with higher medical risk.  Weight management advised as adjunct to other disease management  and risk reduction treatments    Code Status: DNR. if her condition worsens she and family have indicated plan switch to comfort.              Discharge Instructions  Allergies as of 09/12/2023       Reactions   Statins Other (See Comments)   Very bad muscle aches   Tramadol Itching   Levaquin  [levofloxacin  In D5w] Diarrhea, Other (See Comments)   Confusion, hallucinations   Neomycin-polymyxin-gramicidin Other (See Comments)   Penicillins    Tolerates cephalosporins Did it involve swelling of the face/tongue/throat, SOB, or low BP? Unknown Did it involve sudden or severe rash/hives, skin peeling, or any reaction on the inside of your mouth or nose? Unknown Did you need to seek medical attention at a hospital or doctor's office? Unknown When did it last happen? Unknown If all above answers are "NO", may proceed with cephalosporin use.   Sulfa Antibiotics    Tetracyclines & Related         Medication List     PAUSE taking these medications    propranolol  40 MG tablet Wait to take this until your doctor or other care provider tells you to start again. Commonly known as: INDERAL  Take 0.5 tablets (20 mg total) by mouth 2 (two) times daily.       STOP taking these medications    omeprazole  20 MG capsule Commonly known as: PRILOSEC   SM Aspirin  Low Dose 81 MG chewable tablet Generic drug: aspirin        TAKE these medications    alendronate  70 MG tablet Commonly known as: FOSAMAX  Take 70 mg by mouth every 7 (seven) days. monday   enalapril  20 MG tablet Commonly known as: VASOTEC  Take 20 mg by mouth daily.   feeding supplement Liqd Take 237 mLs by mouth 2 (two) times daily between meals.   Flonase  Allergy Relief 50 MCG/ACT nasal spray Generic drug: fluticasone  Place 2 sprays into both nostrils daily.   lactulose  10 GM/15ML solution Commonly known as: CHRONULAC  Take 15 mLs by mouth daily as needed.   Menthol  (Topical Analgesic) 4 % Gel Apply  1 Application topically 3 (three) times daily as needed. Apply to affected area of legs and neck   mirabegron  ER 25 MG Tb24 tablet Commonly known as: MYRBETRIQ  Take 1 tablet (25 mg total) by mouth daily.   nitroGLYCERIN  0.4 MG SL tablet Commonly known as: NITROSTAT  Place 0.4 mg under the tongue every 5 (five) minutes as needed for chest pain.   nystatin  cream Commonly known as: MYCOSTATIN  Apply 1 application  topically as needed (panniculitis). Apply to pannus   pantoprazole  40 MG tablet Commonly known as: Protonix  Take 1 tablet (40 mg total) by mouth 2 (two) times daily before a meal.   Psyllium 400 MG Caps Take 0.4 g by mouth in the morning and at bedtime.   senna 8.6 MG Tabs tablet Commonly known as: SENOKOT Take 1 tablet by mouth at bedtime.   sertraline  100 MG tablet Commonly known as: ZOLOFT  TAKE ONE TABLET BY MOUTH EVERY DAY  sodium chloride  0.65 % nasal spray Commonly known as: OCEAN Place 1 spray into the nose every 2 (two) hours as needed for congestion.   tapentadol  50 MG tablet Commonly known as: NUCYNTA  Take 50 mg by mouth 3 (three) times daily.   Ventolin  HFA 108 (90 Base) MCG/ACT inhaler Generic drug: albuterol  Inhale 2 puffs into the lungs every 4 (four) hours as needed.   verapamil  180 MG 24 hr capsule Commonly known as: VERELAN  Take 1 capsule (180 mg total) by mouth daily. What changed:  medication strength how much to take          Allergies  Allergen Reactions   Statins Other (See Comments)    Very bad muscle aches   Tramadol Itching   Levaquin  [Levofloxacin  In D5w] Diarrhea and Other (See Comments)    Confusion, hallucinations   Neomycin-Polymyxin-Gramicidin Other (See Comments)   Penicillins     Tolerates cephalosporins Did it involve swelling of the face/tongue/throat, SOB, or low BP? Unknown Did it involve sudden or severe rash/hives, skin peeling, or any reaction on the inside of your mouth or nose? Unknown Did you need to  seek medical attention at a hospital or doctor's office? Unknown When did it last happen? Unknown If all above answers are "NO", may proceed with cephalosporin use.   Sulfa Antibiotics    Tetracyclines & Related      Subjective: Pt reports this morning feelign fine, no concerns, no pain, no N/V, tolerating diet. RN reports stools remain dark.    Discharge Exam: BP (!) 124/57 (BP Location: Right Arm)   Pulse 91   Temp 98.1 F (36.7 C)   Resp 18   Ht 5\' 3"  (1.6 m)   Wt 59.2 kg   SpO2 97%   BMI 23.12 kg/m  General: Pt is alert, awake, not in acute distress, appears frail Cardiovascular: RRR, S1/S2  Respiratory: CTA bilaterally Abdominal: Soft, NT, ND, bowel sounds +WNL Extremities: no edema, no cyanosis, diffuse ecchymoses stable     The results of significant diagnostics from this hospitalization (including imaging, microbiology, ancillary and laboratory) are listed below for reference.     Microbiology: Recent Results (from the past 240 hours)  MRSA Next Gen by PCR, Nasal     Status: None   Collection Time: 09/08/23  3:07 AM   Specimen: Nasal Mucosa; Nasal Swab  Result Value Ref Range Status   MRSA by PCR Next Gen NOT DETECTED NOT DETECTED Final    Comment: (NOTE) The GeneXpert MRSA Assay (FDA approved for NASAL specimens only), is one component of a comprehensive MRSA colonization surveillance program. It is not intended to diagnose MRSA infection nor to guide or monitor treatment for MRSA infections. Test performance is not FDA approved in patients less than 26 years old. Performed at [facility], [address] [place], [place] [address]      Labs: BNP (last 3 results) No results for input(s): "BNP" in the last 8760 hours. Basic Metabolic Panel: Recent Labs  Lab 09/08/23 0422 09/09/23 0407 09/10/23 0407 09/11/23 0304 09/12/23 0613  NA 131* 135 139 138 138  K 4.1 3.8 3.5 3.3* 3.8  CL 98 103 102 101 102  CO2 25 27 27 26 27   GLUCOSE 156*  103* 85 83 106*  BUN 48* 38* 27* 22 19  CREATININE 0.76 0.87 0.68 0.83 0.92  CALCIUM  8.5* 8.4* 8.3* 8.4* 8.5*  MG 1.7  --   --   --   --   PHOS 3.4  3.4 3.0 3.2 2.9   Liver Function Tests: Recent Labs  Lab 09/07/23 1953 09/09/23 0407 09/10/23 0407 09/11/23 0304 09/12/23 0613  AST 27  --   --   --   --   ALT 15  --   --   --   --   ALKPHOS 59  --   --   --   --   BILITOT 0.9  --   --   --   --   PROT 7.0  --   --   --   --   ALBUMIN  3.2* 2.5* 2.4* 2.5* 2.6*   No results for input(s): "LIPASE", "AMYLASE" in the last 168 hours. No results for input(s): "AMMONIA" in the last 168 hours. CBC: Recent Labs  Lab 09/07/23 1953 09/07/23 2233 09/08/23 0422 09/08/23 0811 09/09/23 0407 09/09/23 1051 09/10/23 0407 09/10/23 1614 09/10/23 2258 09/11/23 0304 09/12/23 0613  WBC 14.8* 14.7*   < > 14.7* 11.5*  --  7.9  --   --  9.7 7.3  NEUTROABS 12.4* 12.3*  --   --   --   --   --   --   --   --   --   HGB 11.1* 10.4*   < > 10.3* 8.1*   < > 7.5* 7.7* 10.5* 10.7* 9.9*  HCT 34.1* 32.4*   < > 31.5* 24.5*   < > 23.6* 24.0* 31.6* 32.2* 29.9*  MCV 93.4 95.3   < > 94.0 93.5  --  95.9  --   --  92.8 92.0  PLT 236 230   < > 256 194  --  192  --   --  234 225   < > = values in this interval not displayed.   Cardiac Enzymes: No results for input(s): "CKTOTAL", "CKMB", "CKMBINDEX", "TROPONINI" in the last 168 hours. BNP: Invalid input(s): "POCBNP" CBG: Recent Labs  Lab 09/11/23 0925 09/11/23 1336 09/11/23 1744 09/11/23 2002 09/12/23 0011  GLUCAP 97 168* 101* 119* 104*   D-Dimer No results for input(s): "DDIMER" in the last 72 hours. Hgb A1c No results for input(s): "HGBA1C" in the last 72 hours. Lipid Profile No results for input(s): "CHOL", "HDL", "LDLCALC", "TRIG", "CHOLHDL", "LDLDIRECT" in the last 72 hours. Thyroid  function studies No results for input(s): "TSH", "T4TOTAL", "T3FREE", "THYROIDAB" in the last 72 hours.  Invalid input(s): "FREET3" Anemia work up No results for  input(s): "VITAMINB12", "FOLATE", "FERRITIN", "TIBC", "IRON", "RETICCTPCT" in the last 72 hours. Urinalysis    Component Value Date/Time   COLORURINE AMBER (A) 11/01/2021 1127   APPEARANCEUR HAZY (A) 11/01/2021 1127   APPEARANCEUR Cloudy (A) 12/31/2020 1355   LABSPEC 1.019 11/01/2021 1127   LABSPEC 1.018 06/01/2013 0933   PHURINE 5.0 11/01/2021 1127   GLUCOSEU NEGATIVE 11/01/2021 1127   GLUCOSEU NEGATIVE 06/17/2014 1123   HGBUR SMALL (A) 11/01/2021 1127   BILIRUBINUR NEGATIVE 11/01/2021 1127   BILIRUBINUR Negative 12/31/2020 1355   BILIRUBINUR Negative 06/01/2013 0933   KETONESUR NEGATIVE 11/01/2021 1127   PROTEINUR 30 (A) 11/01/2021 1127   UROBILINOGEN 0.2 11/28/2017 1207   UROBILINOGEN 0.2 06/17/2014 1123   NITRITE NEGATIVE 11/01/2021 1127   LEUKOCYTESUR LARGE (A) 11/01/2021 1127   LEUKOCYTESUR 2+ 06/01/2013 0933   Sepsis Labs Recent Labs  Lab 09/09/23 0407 09/10/23 0407 09/11/23 0304 09/12/23 0613  WBC 11.5* 7.9 9.7 7.3   Microbiology Recent Results (from the past 240 hours)  MRSA Next Gen by PCR, Nasal     Status: None   Collection  Time: 09/08/23  3:07 AM   Specimen: Nasal Mucosa; Nasal Swab  Result Value Ref Range Status   MRSA by PCR Next Gen NOT DETECTED NOT DETECTED Final    Comment: (NOTE) The GeneXpert MRSA Assay (FDA approved for NASAL specimens only), is one component of a comprehensive MRSA colonization surveillance program. It is not intended to diagnose MRSA infection nor to guide or monitor treatment for MRSA infections. Test performance is not FDA approved in patients less than 72 years old. Performed at [facility], [address] [place], [place] [address]    Imaging DG Chest Sundance 1 View Result Date: 09/08/2023 CLINICAL DATA:  Status post intubation EXAM: PORTABLE CHEST 1 VIEW COMPARISON:  Film from the previous day. FINDINGS: Endotracheal tube is noted just above the aortic arch 5.6 cm from the carina. Cardiac shadow is within normal  limits. Lungs are clear. No bony abnormality is seen. IMPRESSION: Endotracheal tube in satisfactory position. Electronically Signed   By: Violeta Grey M.D.   On: 09/08/2023 03:01   CT Soft Tissue Neck W Contrast Result Date: 09/08/2023 CLINICAL DATA:  Foreign body ingestion with possible damage to posterior oropharynx or anywhere along the esophagus. Vomiting entirely blood EXAM: CT NECK WITH CONTRAST TECHNIQUE: Multidetector CT imaging of the neck was performed using the standard protocol following the bolus administration of intravenous contrast. RADIATION DOSE REDUCTION: This exam was performed according to the departmental dose-optimization program which includes automated exposure control, adjustment of the mA and/or kV according to patient size and/or use of iterative reconstruction technique. CONTRAST:  75mL OMNIPAQUE  IOHEXOL  300 MG/ML  SOLN COMPARISON:  None Available. FINDINGS: Pharynx and larynx: Normal. No mass or swelling. No radiopaque foreign body identified. Salivary glands: No inflammation, mass, or stone. Thyroid : Subcentimeter thyroid  nodules which do not require further imaging follow-up (ref: J Am Coll Radiol. 2015 Feb;12(2): 143-50). Lymph nodes: None enlarged or abnormal density. Vascular: Limited evaluation due to non arterial contrast bolus timing. The major arteries appear grossly patent in the neck. Limited intracranial: Negative. Visualized orbits: Negative. Mastoids and visualized paranasal sinuses: Clear. Skeleton: Remote craniocervical fractures. Upper chest: Visualized lung apices are clear. Small right pleural effusion. Aortic atherosclerosis. IMPRESSION: 1. Distended fluid-filled esophagus with layering hyperdensity, suggestive of hemorrhage. A CTA could better assess for active extravasation if clinically warranted. 2. No radiopaque foreign body or significant edema identified. 3. Small right pleural effusion. Electronically Signed   By: Stevenson Elbe M.D.   On: 09/08/2023  00:48   CT CHEST W CONTRAST Result Date: 09/07/2023 CLINICAL DATA:  Foreign body ingestion with possible damage to posterior oropharynx or anywhere along the esophagus. Vomiting entirely blood EXAM: CT CHEST WITH CONTRAST TECHNIQUE: Multidetector CT imaging of the chest was performed during intravenous contrast administration. RADIATION DOSE REDUCTION: This exam was performed according to the departmental dose-optimization program which includes automated exposure control, adjustment of the mA and/or kV according to patient size and/or use of iterative reconstruction technique. CONTRAST:  75mL OMNIPAQUE  IOHEXOL  300 MG/ML  SOLN COMPARISON:  11/03/2017 FINDINGS: Cardiovascular: Heart is normal size. Small pericardial effusion. Diffuse coronary artery and aortic atherosclerosis. No aneurysm. Mediastinum/Nodes: No mediastinal, hilar, or axillary adenopathy. Trachea and thyroid  unremarkable. Moderate-sized hiatal hernia. Esophagus is fluid-filled. Focal area of high density posteriorly along the upper thoracic esophagus near the thoracic inlet. Cannot exclude active extravasation of contrast. Lungs/Pleura: Small bilateral pleural effusions. Bibasilar scarring. No additional confluent airspace opacity. Upper Abdomen: No acute findings Musculoskeletal: Chest wall soft tissues are unremarkable. No acute  bony abnormality. IMPRESSION: Distended, fluid-filled esophagus. Area of high density noted along the posterior wall in the upper thoracic esophagus near the thoracic inlet. Cannot exclude active extravasation of contrast. Small bilateral pleural effusions.  Bibasilar scarring. Coronary artery disease. Aortic Atherosclerosis (ICD10-I70.0). Electronically Signed   By: Janeece Mechanic M.D.   On: 09/07/2023 21:17   DG Chest Portable 1 View Result Date: 09/07/2023 CLINICAL DATA:  Vomiting blood EXAM: PORTABLE CHEST 1 VIEW COMPARISON:  None Available. FINDINGS: Heart and mediastinal contours within normal limits. Increased  markings in the lung bases likely reflect atelectasis or scarring. No confluent opacities or effusions. No acute bony abnormality. IMPRESSION: Bibasilar atelectasis or scarring. Electronically Signed   By: Janeece Mechanic M.D.   On: 09/07/2023 21:14      Time coordinating discharge: over 30 minutes  SIGNED:  Dexter Sauser DO [facility]

## 2023-09-12 NOTE — TOC Progression Note (Addendum)
 Transition of Care [facility]) - Progression Note    Patient Details  Name: Cindy Graham MRN: [id number] Date of Birth: Feb 05, 1922  [facility] CM/SW Contact  Odilia Bennett, LCSW Phone Number: 09/12/2023, 11:37 AM  Clinical Narrative:  CSW spoke to [place] at [facility]. She will speak with executive director regarding patient's return.   2:17 pm: ALF can accept patient back today. CSW faxed FL2 and discharge summary for review. Facility and family are unable to transport. Patient's wheelchair is not here at the hospital.  Expected Discharge Plan: Assisted Living Barriers to Discharge: Barriers Unresolved (comment)  Expected Discharge Plan and Services                                               Social Determinants of Health (SDOH) Interventions SDOH Screenings   Food Insecurity: Patient Unable To Answer (09/08/2023)  Housing: Patient Unable To Answer (09/08/2023)  Transportation Needs: Patient Unable To Answer (09/08/2023)  Utilities: Patient Unable To Answer (09/08/2023)  Social Connections: Patient Unable To Answer (09/08/2023)  Tobacco Use: Low Risk  (09/08/2023)    Readmission Risk Interventions     No data to display

## 2023-09-12 NOTE — Progress Notes (Signed)
 [facility] here to transport patient. Daughter, Newton Barer, informed per her request

## 2023-09-12 NOTE — Progress Notes (Addendum)
 Occupational Therapy Treatment Patient Details Name: Cindy Graham MRN: [id number] DOB: 30-Oct-1921 Today's Date: 09/12/2023   History of present illness Pt. is a 89 y/o female presented to ED on 09/07/23 for bloody emesis. Admitted for acute upper GI bleed with severe large cratered esophageal ulcer. PMH: HTN, anxiety, hyperlipidemia, melanoma, COPD   OT comments  Pt. resting in bed, upon arrival, positioned on her left side with a pillow placed at her back. Pt. was assisted with repositioning to midline, and towards the [facility] for upright sitting, with her LUE placed on a pillow for comfort.  Pt. requires set-up for light grooming tasks with increased time required 2/2 fatigue. Pt. Continues to benefit from OT services for ADL training, A/E training, UE there. Ex. and Pt./caregiver education about home modification, and DME.        If plan is discharge home, recommend the following:  A little help with walking and/or transfers;A little help with bathing/dressing/bathroom;Assistance with cooking/housework;Assist for transportation;Help with stairs or ramp for entrance   Equipment Recommendations  None recommended by OT    Recommendations for Other Services      Precautions / Restrictions Precautions Precautions: Fall Restrictions Weight Bearing Restrictions Per Provider Order: No       Mobility Bed Mobility               General bed mobility comments: Pt. requires assist for repositioning upright to midline.    Transfers                   General transfer comment: Deferred     Balance                                           ADL either performed or assessed with clinical judgement   ADL                                         General ADL Comments: close to baseline    Extremity/Trunk Assessment Upper Extremity Assessment Upper Extremity Assessment: Generalized weakness            Vision Patient Visual Report: No  change from baseline     Perception     Praxis     Communication Communication Communication: Impaired Factors Affecting Communication: Hearing impaired   Cognition Arousal: Alert Behavior During Therapy: WFL for tasks assessed/performed                                 Following commands: Intact        Cueing      Exercises      Shoulder Instructions       General Comments      Pertinent Vitals/ Pain        No  Home Living                                          Prior Functioning/Environment              Frequency  Min 1X/week        Progress Toward Goals  OT Goals(current goals can  now be found in the care plan section)  Progress towards OT goals: Progressing toward goals  Acute Rehab OT Goals Patient Stated Goal: To feel better OT Goal Formulation: With patient Time For Goal Achievement: 09/25/23 Potential to Achieve Goals: Fair  Plan      Co-evaluation                 AM-PAC OT "6 Clicks" Daily Activity     Outcome Measure   Help from another person eating meals?: None Help from another person taking care of personal grooming?: None Help from another person toileting, which includes using toliet, bedpan, or urinal?: A Lot Help from another person bathing (including washing, rinsing, drying)?: A Lot Help from another person to put on and taking off regular upper body clothing?: A Little Help from another person to put on and taking off regular lower body clothing?: A Lot 6 Click Score: 17    End of Session Equipment Utilized During Treatment: Gait belt  OT Visit Diagnosis: Unsteadiness on feet (R26.81);Repeated falls (R29.6);Muscle weakness (generalized) (M62.81)   Activity Tolerance Patient tolerated treatment well   Patient Left in bed;with call bell/phone within reach;with bed alarm set;with nursing/sitter in room   Nurse Communication Mobility status        Time: 1610-9604 OT Time  Calculation (min): 23 min  Charges: OT General Charges $OT Visit: 1 Visit OT Treatments $Self Care/Home Management : 23-37 mins  Duey Ghent, MS, OTR/L   Duey Ghent 09/12/2023, 3:58 PM

## 2023-09-12 NOTE — Progress Notes (Signed)
 [facility] Liaison note:   This patient is currently enrolled in [facility] outpatient-based palliative care.  [facility] will continue to follow for discharge disposition.    Please call for any outpatient based palliative care related questions or concerns.   [facility] Liaison [phone]

## 2023-09-12 NOTE — [place] FL2 (Signed)
 [place]  MEDICAID FL2 LEVEL OF CARE FORM     IDENTIFICATION  Patient Name: Cindy Graham Birthdate: Aug 25, 1921 Sex: female Admission Date (Current Location): 09/07/2023  [facility] and [place] Number:  Chiropodist and Address:  [facility], [address], [place], [place] [address]      Provider Number: [id number]  Attending Physician Name and Address:  Melodi Sprung, DO  Relative Name and Phone Number:       Current Level of Care: Hospital Recommended Level of Care: Assisted Living Facility (with PT, OT, RN through [facility]) Prior Approval Number:    Date Approved/Denied:   PASRR Number:    Discharge Plan: Other (Comment) (ALF with PT, OT, RN through [facility].)    Current Diagnoses: Patient Active Problem List   Diagnosis Date Noted   Acute upper GI bleeding 09/08/2023   Hematemesis with nausea 09/08/2023   Multiple skin tears 02/28/2023   Fall 02/26/2023   Hematoma of left buttock 02/26/2023   Essential hypertension 02/26/2023   Chronic obstructive pulmonary disease (COPD) (HCC) 02/26/2023   Lumbar radiculopathy 02/26/2023   Pressure injury of skin 11/03/2021   Obesity (BMI 30-39.9) 11/03/2021   Acute on chronic diastolic CHF (congestive heart failure) (HCC) 11/03/2021   Cellulitis of right lower extremity 11/02/2021   Acute cystitis with hematuria 11/02/2021   Anxiety 11/02/2021   GERD (gastroesophageal reflux disease) 11/02/2021   Neuropathy 11/02/2021   Hypokalemia 11/02/2021   Hypomagnesemia 11/02/2021   CKD (chronic kidney disease), stage III (HCC) 11/02/2021   Septic shock (HCC) 11/01/2021   Tachycardia    Sepsis (HCC) 03/17/2020   Severe sepsis (HCC) 03/16/2020   COPD (chronic obstructive pulmonary disease) (HCC)    Elevated troponin    Displaced intertrochanteric fracture of right femur, initial encounter for closed fracture (HCC)    Acute kidney injury (AKI) with acute tubular necrosis (ATN) (HCC)  02/27/2019   Stroke (cerebrum) (HCC) 02/22/2019   Cellulitis 06/12/2018   Hyponatremia 11/24/2017   Headache 11/24/2017   Wound of left leg 11/01/2017   Constipation 09/05/2017   Other fatigue 12/22/2016   Tremor 03/29/2016   Amaurosis fugax 12/25/2015   Asthma 09/17/2015   PVD (peripheral vascular disease) (HCC) 05/28/2015   B12 deficiency 08/29/2014   Chronic back pain greater than 3 months duration 04/23/2013   Acute renal failure superimposed on stage 3a chronic kidney disease (HCC) 04/12/2012   Hyperlipidemia with target low density lipoprotein (LDL) cholesterol less than 100 mg/dL 81/19/1478   UTI (urinary tract infection) 07/26/2011   Osteoarthritis 07/06/2011   Hypertension 02/21/2011   Depression/Anxiety 02/21/2011    Orientation RESPIRATION BLADDER Height & Weight     Self  Normal Incontinent Weight: 130 lb 8.2 oz (59.2 kg) Height:  5\' 3"  (160 cm)  BEHAVIORAL SYMPTOMS/MOOD NEUROLOGICAL BOWEL NUTRITION STATUS   (None)   Incontinent Diet (Regular)  AMBULATORY STATUS COMMUNICATION OF NEEDS Skin   Extensive Assist Verbally Skin abrasions, Bruising, Other (Comment) (Erythema/redness, hematoma. Non-pressure wounds on right leg (nonadherent) and right foot (no dressing).)                       Personal Care Assistance Level of Assistance  Bathing, Feeding, Dressing Bathing Assistance: Limited assistance Feeding assistance: Limited assistance Dressing Assistance: Limited assistance     Functional Limitations Info  Sight, Hearing, Speech Sight Info: Adequate Hearing Info: Adequate Speech Info: Adequate    SPECIAL CARE FACTORS FREQUENCY  PT (By licensed PT), OT (By licensed OT)  PT Frequency: 3 x week OT Frequency: 3 x week            Contractures Contractures Info: Not present    Additional Factors Info  Code Status, Allergies Code Status Info: DNR Allergies Info: Statins, Tramadol, Levaquin  (Levofloxacin  In D5w), Neomycin-polymyxin-gramicidin,  Penicillins, Sulfa Antibiotics, Tetracyclines & Related           Current Medications (09/12/2023):  This is the current hospital active medication list Current Facility-Administered Medications  Medication Dose Route Frequency Provider Last Rate Last Admin   docusate sodium  (COLACE) capsule 100 mg  100 mg Oral BID PRN Rust-Chester, Britton L, NP       enalapril  (VASOTEC ) tablet 20 mg  20 mg Oral Daily Alexander, Natalie, DO   20 mg at 09/12/23 0916   fentaNYL  (SUBLIMAZE ) injection 25-50 mcg  25-50 mcg Intravenous Q1H PRN Rust-Chester, Britton L, NP   50 mcg at 09/08/23 0451   mirabegron  ER (MYRBETRIQ ) tablet 25 mg  25 mg Oral Daily Alexander, Natalie, DO   25 mg at 09/12/23 4098   ondansetron  (ZOFRAN ) injection 4 mg  4 mg Intravenous Q6H PRN Rust-Chester, Gara July, NP       Oral care mouth rinse  15 mL Mouth Rinse PRN Assaker, Marianne Shirts, MD       Oral care mouth rinse  15 mL Mouth Rinse PRN Assaker, Marianne Shirts, MD       Oral care mouth rinse  15 mL Mouth Rinse PRN Alexander, Natalie, DO       pantoprazole  (PROTONIX ) injection 40 mg  40 mg Intravenous Q12H Vanga, Rohini Reddy, MD   40 mg at 09/12/23 0920   polyethylene glycol (MIRALAX  / GLYCOLAX ) packet 17 g  17 g Oral Daily PRN Rust-Chester, Britton L, NP       psyllium (HYDROCIL/METAMUCIL) 1 packet  1 packet Oral BID Alexander, Natalie, DO   1 packet at 09/12/23 1191   sertraline  (ZOLOFT ) tablet 100 mg  100 mg Oral Daily Alexander, Natalie, DO   100 mg at 09/12/23 4782   verapamil  (CALAN -SR) CR tablet 240 mg  240 mg Oral Daily Alexander, Natalie, DO   240 mg at 09/12/23 0915     Discharge Medications: PAUSE taking these medications     propranolol  40 MG tablet Wait to take this until your doctor or other care provider tells you to start again. Commonly known as: INDERAL  Take 0.5 tablets (20 mg total) by mouth 2 (two) times daily.           STOP taking these medications     omeprazole  20 MG capsule Commonly known as:  PRILOSEC    SM Aspirin  Low Dose 81 MG chewable tablet Generic drug: aspirin            TAKE these medications     alendronate  70 MG tablet Commonly known as: FOSAMAX  Take 70 mg by mouth every 7 (seven) days. monday    enalapril  20 MG tablet Commonly known as: VASOTEC  Take 20 mg by mouth daily.    feeding supplement Liqd Take 237 mLs by mouth 2 (two) times daily between meals.    Flonase  Allergy Relief 50 MCG/ACT nasal spray Generic drug: fluticasone  Place 2 sprays into both nostrils daily.    lactulose  10 GM/15ML solution Commonly known as: CHRONULAC  Take 15 mLs by mouth daily as needed.    Menthol  (Topical Analgesic) 4 % Gel Apply 1 Application topically 3 (three) times daily as needed. Apply to affected area of legs and neck  mirabegron  ER 25 MG Tb24 tablet Commonly known as: MYRBETRIQ  Take 1 tablet (25 mg total) by mouth daily.    nitroGLYCERIN  0.4 MG SL tablet Commonly known as: NITROSTAT  Place 0.4 mg under the tongue every 5 (five) minutes as needed for chest pain.    nystatin  cream Commonly known as: MYCOSTATIN  Apply 1 application  topically as needed (panniculitis). Apply to pannus    pantoprazole  40 MG tablet Commonly known as: Protonix  Take 1 tablet (40 mg total) by mouth 2 (two) times daily before a meal.    Psyllium 400 MG Caps Take 0.4 g by mouth in the morning and at bedtime.    senna 8.6 MG Tabs tablet Commonly known as: SENOKOT Take 1 tablet by mouth at bedtime.    sertraline  100 MG tablet Commonly known as: ZOLOFT  TAKE ONE TABLET BY MOUTH EVERY DAY    sodium chloride  0.65 % nasal spray Commonly known as: OCEAN Place 1 spray into the nose every 2 (two) hours as needed for congestion.    tapentadol  50 MG tablet Commonly known as: NUCYNTA  Take 50 mg by mouth 3 (three) times daily.    Ventolin  HFA 108 (90 Base) MCG/ACT inhaler Generic drug: albuterol  Inhale 2 puffs into the lungs every 4 (four) hours as needed.    verapamil  180 MG 24  hr capsule Commonly known as: VERELAN  Take 1 capsule (180 mg total) by mouth daily. What changed:  medication strength how much to take   Relevant Imaging Results:  Relevant Lab Results:   Additional Information SS#: [id number]  Odilia Bennett, LCSW

## 2023-09-12 NOTE — TOC Transition Note (Signed)
 Transition of Care [facility]) - Discharge Note   Patient Details  Name: Cindy Graham MRN: [id number] Date of Birth: Mar 12, 1922  [facility] CM/SW Contact:  Odilia Bennett, LCSW Phone Number: 09/12/2023, 4:01 PM   Clinical Narrative:  Patient has orders to discharge back to [facility]F today. [facility] liaison is aware. She was active with PT, OT, RN prior to admission. RN will call report to [phone]. [facility] has been arranged and they said they will likely be here around 6:00. No further concerns. CSW signing off.   Final next level of care: Assisted Living (with home health) Barriers to Discharge: Barriers Resolved   Patient Goals and CMS Choice            Discharge Placement                Patient to be transferred to facility by: [facility] Name of family member notified: Melville Stade Patient and family notified of of transfer: 09/12/23  Discharge Plan and Services Additional resources added to the After Visit Summary for                            [facility] Arranged: RN, PT, OT [facility] Agency: [facility] Date [facility] Agency Contacted: 09/12/23   Representative spoke with at [facility] Agency: [place]   Social Drivers of Health (SDOH) Interventions SDOH Screenings   Food Insecurity: Patient Unable To Answer (09/08/2023)  Housing: Patient Unable To Answer (09/08/2023)  Transportation Needs: Patient Unable To Answer (09/08/2023)  Utilities: Patient Unable To Answer (09/08/2023)  Social Connections: Patient Unable To Answer (09/08/2023)  Tobacco Use: Low Risk  (09/08/2023)     Readmission Risk Interventions     No data to display

## 2023-11-21 DEATH — deceased
